# Patient Record
Sex: Male | Born: 1935
Health system: Southern US, Community
[De-identification: ages and names within clinical notes are randomized; demographics above are authoritative.]

## PROBLEM LIST (undated history)

## (undated) DIAGNOSIS — I10 Essential (primary) hypertension: Secondary | ICD-10-CM

## (undated) DIAGNOSIS — I671 Cerebral aneurysm, nonruptured: Secondary | ICD-10-CM

## (undated) DIAGNOSIS — E119 Type 2 diabetes mellitus without complications: Secondary | ICD-10-CM

## (undated) DIAGNOSIS — E785 Hyperlipidemia, unspecified: Secondary | ICD-10-CM

## (undated) DIAGNOSIS — K635 Polyp of colon: Secondary | ICD-10-CM

## (undated) DIAGNOSIS — F0390 Unspecified dementia without behavioral disturbance: Secondary | ICD-10-CM

## (undated) DIAGNOSIS — I213 ST elevation (STEMI) myocardial infarction of unspecified site: Secondary | ICD-10-CM

## (undated) DIAGNOSIS — R569 Unspecified convulsions: Secondary | ICD-10-CM

## (undated) DIAGNOSIS — N529 Male erectile dysfunction, unspecified: Secondary | ICD-10-CM

## (undated) DIAGNOSIS — K219 Gastro-esophageal reflux disease without esophagitis: Secondary | ICD-10-CM

## (undated) DIAGNOSIS — E039 Hypothyroidism, unspecified: Secondary | ICD-10-CM

## (undated) HISTORY — DX: Essential (primary) hypertension: I10

## (undated) HISTORY — DX: Gastro-esophageal reflux disease without esophagitis: K21.9

## (undated) HISTORY — DX: Polyp of colon: K63.5

## (undated) HISTORY — DX: Hyperlipidemia, unspecified: E78.5

## (undated) HISTORY — DX: Male erectile dysfunction, unspecified: N52.9

## (undated) HISTORY — PX: COLONOSCOPY: SHX174

## (undated) HISTORY — PX: OTHER SURGICAL HISTORY: SHX169

## (undated) HISTORY — DX: Hypothyroidism, unspecified: E03.9

## (undated) HISTORY — DX: Unspecified dementia, unspecified severity, without behavioral disturbance, psychotic disturbance, mood disturbance, and anxiety: F03.90

---

## 1999-04-07 ENCOUNTER — Ambulatory Visit (HOSPITAL_BASED_OUTPATIENT_CLINIC_OR_DEPARTMENT_OTHER): Admission: RE | Admit: 1999-04-07 | Discharge: 1999-04-07 | Payer: Self-pay | Admitting: *Deleted

## 2001-06-18 DIAGNOSIS — K635 Polyp of colon: Secondary | ICD-10-CM

## 2001-06-18 HISTORY — DX: Polyp of colon: K63.5

## 2001-11-26 ENCOUNTER — Ambulatory Visit (HOSPITAL_COMMUNITY): Admission: RE | Admit: 2001-11-26 | Discharge: 2001-11-26 | Payer: Self-pay | Admitting: Internal Medicine

## 2001-11-26 ENCOUNTER — Encounter (INDEPENDENT_AMBULATORY_CARE_PROVIDER_SITE_OTHER): Payer: Self-pay | Admitting: Specialist

## 2002-09-08 ENCOUNTER — Encounter: Payer: Self-pay | Admitting: Family Medicine

## 2002-09-08 ENCOUNTER — Encounter: Admission: RE | Admit: 2002-09-08 | Discharge: 2002-09-08 | Payer: Self-pay | Admitting: Family Medicine

## 2004-10-30 ENCOUNTER — Ambulatory Visit: Payer: Self-pay | Admitting: Family Medicine

## 2004-12-08 ENCOUNTER — Ambulatory Visit: Payer: Self-pay | Admitting: Internal Medicine

## 2005-01-05 ENCOUNTER — Encounter (INDEPENDENT_AMBULATORY_CARE_PROVIDER_SITE_OTHER): Payer: Self-pay | Admitting: *Deleted

## 2005-01-05 ENCOUNTER — Ambulatory Visit: Payer: Self-pay | Admitting: Internal Medicine

## 2005-05-08 ENCOUNTER — Ambulatory Visit: Payer: Self-pay | Admitting: Family Medicine

## 2005-10-31 ENCOUNTER — Ambulatory Visit: Payer: Self-pay | Admitting: Family Medicine

## 2006-03-20 ENCOUNTER — Ambulatory Visit: Payer: Self-pay | Admitting: Family Medicine

## 2006-06-18 LAB — FECAL OCCULT BLOOD, GUAIAC: Fecal Occult Blood: NEGATIVE

## 2006-11-12 ENCOUNTER — Ambulatory Visit: Payer: Self-pay | Admitting: Family Medicine

## 2006-11-12 LAB — CONVERTED CEMR LAB
ALT: 26 units/L (ref 0–40)
AST: 26 units/L (ref 0–37)
Albumin: 3.9 g/dL (ref 3.5–5.2)
Alkaline Phosphatase: 48 units/L (ref 39–117)
BUN: 13 mg/dL (ref 6–23)
Basophils Absolute: 0 10*3/uL (ref 0.0–0.1)
Basophils Relative: 0.4 % (ref 0.0–1.0)
Bilirubin, Direct: 0.2 mg/dL (ref 0.0–0.3)
CO2: 30 meq/L (ref 19–32)
Calcium: 9.3 mg/dL (ref 8.4–10.5)
Chloride: 104 meq/L (ref 96–112)
Cholesterol: 155 mg/dL (ref 0–200)
Creatinine, Ser: 0.9 mg/dL (ref 0.4–1.5)
Eosinophils Absolute: 0.1 10*3/uL (ref 0.0–0.6)
Eosinophils Relative: 1.4 % (ref 0.0–5.0)
GFR calc Af Amer: 107 mL/min
GFR calc non Af Amer: 88 mL/min
Glucose, Bld: 124 mg/dL — ABNORMAL HIGH (ref 70–99)
HCT: 47.5 % (ref 39.0–52.0)
HDL: 28.7 mg/dL — ABNORMAL LOW (ref >39.0)
Hemoglobin: 16.5 g/dL (ref 13.0–17.0)
Hgb A1c MFr Bld: 6.5 % — ABNORMAL HIGH (ref 4.6–6.0)
LDL Cholesterol: 94 mg/dL (ref 0–99)
Lymphocytes Relative: 30.1 % (ref 12.0–46.0)
MCHC: 34.8 g/dL (ref 30.0–36.0)
MCV: 86.8 fL (ref 78.0–100.0)
Monocytes Absolute: 0.8 10*3/uL — ABNORMAL HIGH (ref 0.2–0.7)
Monocytes Relative: 9.9 % (ref 3.0–11.0)
Neutro Abs: 4.6 10*3/uL (ref 1.4–7.7)
Neutrophils Relative %: 58.2 % (ref 43.0–77.0)
PSA: 0.39 ng/mL
PSA: 0.39 ng/mL (ref 0.10–4.00)
Platelets: 240 10*3/uL (ref 150–400)
Potassium: 3.6 meq/L (ref 3.5–5.1)
RBC: 5.46 M/uL (ref 4.22–5.81)
RDW: 12.2 % (ref 11.5–14.6)
Sodium: 141 meq/L (ref 135–145)
TSH: 4.96 microintl units/mL (ref 0.35–5.50)
Total Bilirubin: 1.1 mg/dL (ref 0.3–1.2)
Total CHOL/HDL Ratio: 5.4
Total Protein: 7.2 g/dL (ref 6.0–8.3)
Triglycerides: 162 mg/dL — ABNORMAL HIGH (ref 0–149)
VLDL: 32 mg/dL (ref 0–40)
WBC: 7.9 10*3/uL (ref 4.5–10.5)

## 2006-11-15 ENCOUNTER — Encounter: Payer: Self-pay | Admitting: Family Medicine

## 2006-11-15 DIAGNOSIS — K219 Gastro-esophageal reflux disease without esophagitis: Secondary | ICD-10-CM | POA: Insufficient documentation

## 2006-11-15 DIAGNOSIS — D126 Benign neoplasm of colon, unspecified: Secondary | ICD-10-CM | POA: Insufficient documentation

## 2006-11-28 ENCOUNTER — Ambulatory Visit: Payer: Self-pay | Admitting: Family Medicine

## 2007-04-23 ENCOUNTER — Ambulatory Visit: Payer: Self-pay | Admitting: Family Medicine

## 2008-01-27 ENCOUNTER — Ambulatory Visit: Payer: Self-pay | Admitting: Family Medicine

## 2008-01-27 DIAGNOSIS — E785 Hyperlipidemia, unspecified: Secondary | ICD-10-CM | POA: Insufficient documentation

## 2008-01-27 DIAGNOSIS — E783 Hyperchylomicronemia: Secondary | ICD-10-CM | POA: Insufficient documentation

## 2008-01-27 DIAGNOSIS — E039 Hypothyroidism, unspecified: Secondary | ICD-10-CM | POA: Insufficient documentation

## 2008-03-23 ENCOUNTER — Ambulatory Visit: Payer: Self-pay | Admitting: Family Medicine

## 2008-04-15 ENCOUNTER — Encounter: Payer: Self-pay | Admitting: Family Medicine

## 2008-04-15 ENCOUNTER — Ambulatory Visit: Payer: Self-pay | Admitting: Family Medicine

## 2008-04-19 ENCOUNTER — Telehealth: Payer: Self-pay | Admitting: Family Medicine

## 2008-04-19 DIAGNOSIS — C44300 Unspecified malignant neoplasm of skin of unspecified part of face: Secondary | ICD-10-CM | POA: Insufficient documentation

## 2009-02-08 ENCOUNTER — Ambulatory Visit: Payer: Self-pay | Admitting: Family Medicine

## 2009-02-11 ENCOUNTER — Telehealth: Payer: Self-pay | Admitting: *Deleted

## 2009-02-16 ENCOUNTER — Encounter: Payer: Self-pay | Admitting: Family Medicine

## 2009-02-16 ENCOUNTER — Ambulatory Visit: Payer: Self-pay | Admitting: Family Medicine

## 2009-02-17 DIAGNOSIS — L570 Actinic keratosis: Secondary | ICD-10-CM | POA: Insufficient documentation

## 2009-04-14 ENCOUNTER — Ambulatory Visit: Payer: Self-pay | Admitting: Family Medicine

## 2009-04-14 LAB — CONVERTED CEMR LAB
Glucose, Bld: 124 mg/dL — ABNORMAL HIGH (ref 70–99)
Hgb A1c MFr Bld: 6.6 % — ABNORMAL HIGH (ref 4.6–6.5)

## 2009-05-04 ENCOUNTER — Telehealth: Payer: Self-pay | Admitting: Family Medicine

## 2009-11-30 ENCOUNTER — Encounter (INDEPENDENT_AMBULATORY_CARE_PROVIDER_SITE_OTHER): Payer: Self-pay | Admitting: *Deleted

## 2010-02-16 ENCOUNTER — Encounter (INDEPENDENT_AMBULATORY_CARE_PROVIDER_SITE_OTHER): Payer: Self-pay | Admitting: *Deleted

## 2010-02-17 ENCOUNTER — Encounter (INDEPENDENT_AMBULATORY_CARE_PROVIDER_SITE_OTHER): Payer: Self-pay | Admitting: *Deleted

## 2010-02-21 ENCOUNTER — Ambulatory Visit: Payer: Self-pay | Admitting: Internal Medicine

## 2010-02-22 ENCOUNTER — Ambulatory Visit: Payer: Self-pay | Admitting: Family Medicine

## 2010-02-22 LAB — CONVERTED CEMR LAB
ALT: 22 units/L (ref 0–53)
AST: 23 units/L (ref 0–37)
Albumin: 3.9 g/dL (ref 3.5–5.2)
Alkaline Phosphatase: 56 units/L (ref 39–117)
BUN: 18 mg/dL (ref 6–23)
Basophils Absolute: 0 10*3/uL (ref 0.0–0.1)
Basophils Relative: 0.5 % (ref 0.0–3.0)
Bilirubin Urine: NEGATIVE
Bilirubin, Direct: 0.2 mg/dL (ref 0.0–0.3)
CO2: 30 meq/L (ref 19–32)
Calcium: 9 mg/dL (ref 8.4–10.5)
Chloride: 102 meq/L (ref 96–112)
Cholesterol: 161 mg/dL (ref 0–200)
Creatinine, Ser: 0.9 mg/dL (ref 0.4–1.5)
Eosinophils Absolute: 0.1 10*3/uL (ref 0.0–0.7)
Eosinophils Relative: 1.5 % (ref 0.0–5.0)
GFR calc non Af Amer: 86.48 mL/min (ref >60)
Glucose, Bld: 135 mg/dL — ABNORMAL HIGH (ref 70–99)
Glucose, Urine, Semiquant: NEGATIVE
HCT: 48.1 % (ref 39.0–52.0)
HDL: 29.9 mg/dL — ABNORMAL LOW (ref >39.00)
Hemoglobin: 16.5 g/dL (ref 13.0–17.0)
Ketones, urine, test strip: NEGATIVE
LDL Cholesterol: 101 mg/dL — ABNORMAL HIGH (ref 0–99)
Lymphocytes Relative: 33.7 % (ref 12.0–46.0)
Lymphs Abs: 2.5 10*3/uL (ref 0.7–4.0)
MCHC: 34.3 g/dL (ref 30.0–36.0)
MCV: 90.2 fL (ref 78.0–100.0)
Monocytes Absolute: 0.6 10*3/uL (ref 0.1–1.0)
Monocytes Relative: 7.6 % (ref 3.0–12.0)
Neutro Abs: 4.2 10*3/uL (ref 1.4–7.7)
Neutrophils Relative %: 56.7 % (ref 43.0–77.0)
Nitrite: NEGATIVE
PSA: 0.4 ng/mL (ref 0.10–4.00)
Platelets: 198 10*3/uL (ref 150.0–400.0)
Potassium: 4.4 meq/L (ref 3.5–5.1)
Protein, U semiquant: NEGATIVE
RBC: 5.33 M/uL (ref 4.22–5.81)
RDW: 13 % (ref 11.5–14.6)
Sodium: 140 meq/L (ref 135–145)
Specific Gravity, Urine: 1.025
TSH: 6.22 microintl units/mL — ABNORMAL HIGH (ref 0.35–5.50)
Total Bilirubin: 1.2 mg/dL (ref 0.3–1.2)
Total CHOL/HDL Ratio: 5
Total Protein: 6.6 g/dL (ref 6.0–8.3)
Triglycerides: 149 mg/dL (ref 0.0–149.0)
Urobilinogen, UA: 0.2
VLDL: 29.8 mg/dL (ref 0.0–40.0)
WBC Urine, dipstick: NEGATIVE
WBC: 7.5 10*3/uL (ref 4.5–10.5)
pH: 6

## 2010-03-03 ENCOUNTER — Ambulatory Visit: Payer: Self-pay | Admitting: Family Medicine

## 2010-03-03 ENCOUNTER — Telehealth: Payer: Self-pay | Admitting: Family Medicine

## 2010-03-06 ENCOUNTER — Encounter: Payer: Self-pay | Admitting: Family Medicine

## 2010-03-07 ENCOUNTER — Ambulatory Visit: Payer: Self-pay | Admitting: Internal Medicine

## 2010-03-07 LAB — HM COLONOSCOPY

## 2010-03-08 ENCOUNTER — Encounter: Payer: Self-pay | Admitting: Internal Medicine

## 2010-04-10 ENCOUNTER — Ambulatory Visit: Payer: Self-pay | Admitting: Family Medicine

## 2010-04-14 ENCOUNTER — Ambulatory Visit: Payer: Self-pay | Admitting: Family Medicine

## 2010-04-17 LAB — CONVERTED CEMR LAB: TSH: 4.03 microintl units/mL (ref 0.35–5.50)

## 2010-07-16 LAB — CONVERTED CEMR LAB
ALT: 22 units/L (ref 0–53)
ALT: 30 units/L (ref 0–53)
AST: 28 units/L (ref 0–37)
AST: 63 units/L — ABNORMAL HIGH (ref 0–37)
Albumin: 3.7 g/dL (ref 3.5–5.2)
Albumin: 3.9 g/dL (ref 3.5–5.2)
Alkaline Phosphatase: 63 units/L (ref 39–117)
Alkaline Phosphatase: 68 units/L (ref 39–117)
BUN: 11 mg/dL (ref 6–23)
BUN: 15 mg/dL (ref 6–23)
Basophils Absolute: 0 10*3/uL (ref 0.0–0.1)
Basophils Absolute: 0 10*3/uL (ref 0.0–0.1)
Basophils Relative: 0.2 % (ref 0.0–3.0)
Basophils Relative: 0.6 % (ref 0.0–3.0)
Bilirubin, Direct: 0.2 mg/dL (ref 0.0–0.3)
Bilirubin, Direct: 0.2 mg/dL (ref 0.0–0.3)
Blood in Urine, dipstick: NEGATIVE
Blood in Urine, dipstick: NEGATIVE
CO2: 30 meq/L (ref 19–32)
CO2: 32 meq/L (ref 19–32)
Calcium: 8.9 mg/dL (ref 8.4–10.5)
Calcium: 9.4 mg/dL (ref 8.4–10.5)
Chloride: 100 meq/L (ref 96–112)
Chloride: 104 meq/L (ref 96–112)
Cholesterol: 144 mg/dL (ref 0–200)
Cholesterol: 161 mg/dL (ref 0–200)
Creatinine, Ser: 0.9 mg/dL (ref 0.4–1.5)
Creatinine, Ser: 0.9 mg/dL (ref 0.4–1.5)
Eosinophils Absolute: 0.1 10*3/uL (ref 0.0–0.7)
Eosinophils Absolute: 0.1 10*3/uL (ref 0.0–0.7)
Eosinophils Relative: 1 % (ref 0.0–5.0)
Eosinophils Relative: 1.2 % (ref 0.0–5.0)
GFR calc Af Amer: 107 mL/min
GFR calc non Af Amer: 87.84 mL/min (ref >60)
GFR calc non Af Amer: 88 mL/min
Glucose, Bld: 145 mg/dL — ABNORMAL HIGH (ref 70–99)
Glucose, Bld: 152 mg/dL — ABNORMAL HIGH (ref 70–99)
Glucose, Urine, Semiquant: NEGATIVE
Glucose, Urine, Semiquant: NEGATIVE
HCT: 45.9 % (ref 39.0–52.0)
HCT: 47.3 % (ref 39.0–52.0)
HDL: 29.2 mg/dL — ABNORMAL LOW (ref >39.0)
HDL: 32.2 mg/dL — ABNORMAL LOW (ref >39.00)
Hemoglobin: 15.8 g/dL (ref 13.0–17.0)
Hemoglobin: 16.3 g/dL (ref 13.0–17.0)
Ketones, urine, test strip: NEGATIVE
LDL Cholesterol: 81 mg/dL (ref 0–99)
LDL Cholesterol: 92 mg/dL (ref 0–99)
Lymphocytes Relative: 24.9 % (ref 12.0–46.0)
Lymphocytes Relative: 27.2 % (ref 12.0–46.0)
Lymphs Abs: 2 10*3/uL (ref 0.7–4.0)
MCHC: 34.4 g/dL (ref 30.0–36.0)
MCHC: 34.5 g/dL (ref 30.0–36.0)
MCV: 88.9 fL (ref 78.0–100.0)
MCV: 91.8 fL (ref 78.0–100.0)
Monocytes Absolute: 0.6 10*3/uL (ref 0.1–1.0)
Monocytes Absolute: 0.6 10*3/uL (ref 0.1–1.0)
Monocytes Relative: 7.4 % (ref 3.0–12.0)
Monocytes Relative: 7.8 % (ref 3.0–12.0)
Neutro Abs: 4.8 10*3/uL (ref 1.4–7.7)
Neutro Abs: 5.1 10*3/uL (ref 1.4–7.7)
Neutrophils Relative %: 63.6 % (ref 43.0–77.0)
Neutrophils Relative %: 66.1 % (ref 43.0–77.0)
Nitrite: NEGATIVE
Nitrite: NEGATIVE
PSA: 0.33 ng/mL (ref 0.10–4.00)
PSA: 0.37 ng/mL (ref 0.10–4.00)
Platelets: 181 10*3/uL (ref 150.0–400.0)
Platelets: 216 10*3/uL (ref 150–400)
Potassium: 4.1 meq/L (ref 3.5–5.1)
Potassium: 4.5 meq/L (ref 3.5–5.1)
RBC: 5 M/uL (ref 4.22–5.81)
RBC: 5.32 M/uL (ref 4.22–5.81)
RDW: 12.1 % (ref 11.5–14.6)
RDW: 12.2 % (ref 11.5–14.6)
Sodium: 137 meq/L (ref 135–145)
Sodium: 140 meq/L (ref 135–145)
Specific Gravity, Urine: 1.02
Specific Gravity, Urine: 1.02
TSH: 4.68 microintl units/mL (ref 0.35–5.50)
TSH: 4.82 microintl units/mL (ref 0.35–5.50)
Total Bilirubin: 1.3 mg/dL — ABNORMAL HIGH (ref 0.3–1.2)
Total Bilirubin: 1.4 mg/dL — ABNORMAL HIGH (ref 0.3–1.2)
Total CHOL/HDL Ratio: 4
Total CHOL/HDL Ratio: 5.5
Total Protein: 7.2 g/dL (ref 6.0–8.3)
Total Protein: 7.3 g/dL (ref 6.0–8.3)
Triglycerides: 156 mg/dL — ABNORMAL HIGH (ref 0.0–149.0)
Triglycerides: 198 mg/dL — ABNORMAL HIGH (ref 0–149)
Urobilinogen, UA: 1
Urobilinogen, UA: 4
VLDL: 31.2 mg/dL (ref 0.0–40.0)
VLDL: 40 mg/dL (ref 0–40)
WBC Urine, dipstick: NEGATIVE
WBC Urine, dipstick: NEGATIVE
WBC: 7.5 10*3/uL (ref 4.5–10.5)
WBC: 7.7 10*3/uL (ref 4.5–10.5)
pH: 6
pH: 6

## 2010-07-20 NOTE — Assessment & Plan Note (Signed)
Summary: CPX/RCD   Vital Signs:  Patient profile:   77 year old male Height:      71.75 inches Weight:      209 pounds BMI:     28.65 O2 Sat:      97 % on Room air Temp:     97.8 degrees F oral BP sitting:   112 / 80  (right arm) Cuff size:   regular  Vitals Entered By: Kathrynn Speed CMA (March 03, 2010 9:41 AM)  O2 Flow:  Room air CC: cpx, with lab review, src Is Patient Diabetic? No Flu Vaccine Consent Questions     Do you have a history of severe allergic reactions to this vaccine? no    Any prior history of allergic reactions to egg and/or gelatin? no    Do you have a sensitivity to the preservative Thimersol? no    Do you have a past history of Guillan-Barre Syndrome? no    Do you currently have an acute febrile illness? no    Have you ever had a severe reaction to latex? no    Vaccine information given and explained to patient? yes    Are you currently pregnant? no    Lot Number:[id number]   Exp Date:12/16/2010   Site Given  Left Deltoid IM   CC:  cpx, with lab review, and src.  History of Present Illness: Gerald Leach is a 77 year old, married male, nonsmoker, who comes in today for evaluation of multiple issues.  He has a history of hypertension, for which he takes Tenoretic, dose one half tab daily BP 112/80.  He takes Zocor 10 mg nightly for hyperlipidemia.  Lipids are ago with LDL of 101.  He also takes Synthroid 50 micrograms daily for hypothyroidism his Synthroid will be increased because his TSH level is over 5.5.  It 6.22.  He also takes Prilosec 20 mg 3 times a week for reflux esophagitis.  It is not needed.  Daily.  He takes an 81-mg baby aspirin gets routine eye care.  Dental care.  Colonoscopy in the next week and GI, tetanus, 2005, Pneumovax 2010, seasonal flu shot today, information given on shingles  He also has a history of skin cancer.  He saw the dermatologist yesterday.  Preventive Screening-Counseling & Management  Alcohol-Tobacco     Smoking  Status: never  Current Medications (verified): 1)  Levothyroxine Sodium 50 Mcg Tabs (Levothyroxine Sodium) .Marland Kitchen.. 1 Once Daily 2)  Prilosec 20 Mg Cpdr (Omeprazole) .... Take 1 Capsule By Mouth Three Times A Week. 3)  Tenoretic 50 50-25 Mg Tabs (Atenolol-Chlorthalidone) .... 1/2 Tablet By Mouth Once A Day 4)  Zocor 10 Mg Tabs (Simvastatin) .... Take 1 Tablet By Mouth At Bedtime 5)  Aspirin Adult Low Strength 81 Mg  Tbec (Aspirin) .... Once Daily  Allergies (verified): No Known Drug Allergies  Past History:  Past medical, surgical, family and social histories (including risk factors) reviewed, and no changes noted (except as noted below).  Past Medical History: Reviewed history from 01/27/2008 and no changes required. Hypertension COLONIC POLYPS-03 GERD ED  brain aneurysm surgery at [facility], 2002 Hyperlipidemia Hypothyroidism  Past Surgical History: Reviewed history from 11/15/2006 and no changes required. CAROTID ARTERY ANEURYSM COLONOSCOPY -POLYPS R HERNIA  Family History: Reviewed history and no changes required.  Social History: Reviewed history from 01/27/2008 and no changes required. Retired Married Never Smoked Alcohol use-no Drug use-no Regular exercise-yes  Review of Systems      See HPI  Physical Exam  General:  Well-developed,well-nourished,in no acute distress; alert,appropriate and cooperative throughout examination Head:  Normocephalic and atraumatic without obvious abnormalities. No apparent alopecia or balding. Eyes:  No corneal or conjunctival inflammation noted. EOMI. Perrla. Funduscopic exam benign, without hemorrhages, exudates or papilledema. Vision grossly normal. Ears:  External ear exam shows no significant lesions or deformities.  Otoscopic examination reveals clear canals, tympanic membranes are intact bilaterally without bulging, retraction, inflammation or discharge. Hearing is grossly normal bilaterally. Nose:  External nasal examination  shows no deformity or inflammation. Nasal mucosa are pink and moist without lesions or exudates. Mouth:  Oral mucosa and oropharynx without lesions or exudates.  Teeth in good repair. Neck:  No deformities, masses, or tenderness noted. Chest Wall:  No deformities, masses, tenderness or gynecomastia noted. Breasts:  No masses or gynecomastia noted Lungs:  Normal respiratory effort, chest expands symmetrically. Lungs are clear to auscultation, no crackles or wheezes. Heart:  Normal rate and regular rhythm. S1 and S2 normal without gallop, murmur, click, rub or other extra sounds. Abdomen:  Bowel sounds positive,abdomen soft and non-tender without masses, organomegaly or hernias noted. Rectal:  No external abnormalities noted. Normal sphincter tone. No rectal masses or tenderness. Genitalia:  Testes bilaterally descended without nodularity, tenderness or masses. No scrotal masses or lesions. No penis lesions or urethral discharge. Prostate:  Prostate gland firm and smooth, no enlargement, nodularity, tenderness, mass, asymmetry or induration. Msk:  No deformity or scoliosis noted of thoracic or lumbar spine.   Pulses:  R and L carotid,radial,femoral,dorsalis pedis and posterior tibial pulses are full and equal bilaterally Extremities:  No clubbing, cyanosis, edema, or deformity noted with normal full range of motion of all joints.   Neurologic:  No cranial nerve deficits noted. Station and gait are normal. Plantar reflexes are down-going bilaterally. DTRs are symmetrical throughout. Sensory, motor and coordinative functions appear intact. Skin:  Intact without suspicious lesions or rashes Cervical Nodes:  No lymphadenopathy noted Axillary Nodes:  No palpable lymphadenopathy Inguinal Nodes:  No significant adenopathy Psych:  Cognition and judgment appear intact. Alert and cooperative with normal attention span and concentration. No apparent delusions, illusions, hallucinations   Impression &  Recommendations:  Problem # 1:  HYPOTHYROIDISM (ICD-244.9) Assessment Deteriorated  The following medications were removed from the medication list:    Levothyroxine Sodium 50 Mcg Tabs (Levothyroxine sodium) .Marland Kitchen... 1 once daily His updated medication list for this problem includes:    Synthroid 75 Mcg Tabs (Levothyroxine sodium) .Marland Kitchen... Take 1 tablet by mouth every morning  Orders: Prescription Created Electronically [phone])  Problem # 2:  GERD (ICD-530.81) Assessment: Improved  His updated medication list for this problem includes:    Prilosec 20 Mg Cpdr (Omeprazole) .Marland Kitchen... Take 1 capsule by mouth three times a week.  Orders: Prescription Created Electronically [phone])  Problem # 3:  HYPERTENSION (ICD-401.9) Assessment: Improved  His updated medication list for this problem includes:    Tenoretic 50 50-25 Mg Tabs (Atenolol-chlorthalidone) .Marland Kitchen... 1/2 tablet by mouth once a day  Orders: EKG w/ Interpretation (93000) Prescription Created Electronically [phone])  Problem # 4:  Preventive Health Care (ICD-V70.0) Assessment: Unchanged  Complete Medication List: 1)  Prilosec 20 Mg Cpdr (Omeprazole) .... Take 1 capsule by mouth three times a week. 2)  Tenoretic 50 50-25 Mg Tabs (Atenolol-chlorthalidone) .... 1/2 tablet by mouth once a day 3)  Zocor 10 Mg Tabs (Simvastatin) .... Take 1 tablet by mouth at bedtime 4)  Aspirin Adult Low Strength 81 Mg Tbec (Aspirin) .... Once daily 5)  Synthroid 75  Mcg Tabs (Levothyroxine sodium) .... Take 1 tablet by mouth every morning  Other Orders: Admin 1st Vaccine (16010) Flu Vaccine 30yrs + ([address]3235)  Patient Instructions: 1)  increased the thyroid medication to 75 micrograms daily daily follow-up TSH level in 6 weeks.  Code number 244.9. 2)  Please schedule a follow-up appointment in 1 year. Prescriptions: ZOCOR 10 MG TABS (SIMVASTATIN) Take 1 tablet by mouth at bedtime  #100 x 3   Entered and Authorized by:   Roderick Pee MD   Signed by:    Roderick Pee MD on 03/03/2010   Method used:   [facility] to        [facility] [address] [phone]* (retail)       [address]       [place], [place]  [address]       Ph: [phone] or [phone]       Fax: [phone]   RxID:   [id number] TENORETIC 50 50-25 MG TABS (ATENOLOL-CHLORTHALIDONE) 1/2 tablet by mouth once a day  #50 x 3   Entered and Authorized by:   Roderick Pee MD   Signed by:   Roderick Pee MD on 03/03/2010   Method used:   [facility]        [facility] [address] [phone]* (retail)       [address]       [place], [place]  [address]       Ph: [phone] or [phone]       Fax: [phone]   RxID:   [id number] PRILOSEC 20 MG CPDR (OMEPRAZOLE) Take 1 capsule by mouth three times a week.  #90 x 2   Entered and Authorized by:   Roderick Pee MD   Signed by:   Roderick Pee MD on 03/03/2010   Method used:   [facility]        [facility] [address] [phone]* (retail)       [address]       [place], [place]  [address]       Ph: [phone] or [phone]       Fax: [phone]   RxID:   [id number] SYNTHROID 75 MCG TABS (LEVOTHYROXINE SODIUM) Take 1 tablet by mouth every morning  #100 x 3   Entered and Authorized by:   Roderick Pee MD   Signed by:   Roderick Pee MD on 03/03/2010   Method used:   [facility]        [facility] [address] [phone]* (retail)       [address]       [place], [place]  [address]       Ph: [phone] or [phone]       Fax: [phone]   RxID:   [id number]

## 2010-07-20 NOTE — Miscellaneous (Signed)
Summary: [facility] Previsit/prep  Clinical Lists Changes  Medications: Added new medication of DULCOLAX 5 MG  TBEC (BISACODYL) Day before procedure take 2 at 3pm and 2 at 8pm. - Signed Added new medication of METOCLOPRAMIDE HCL 10 MG  TABS (METOCLOPRAMIDE HCL) As per prep instructions. - Signed Added new medication of MIRALAX   POWD (POLYETHYLENE GLYCOL 3350) As per prep  instructions. - Signed Rx of DULCOLAX 5 MG  TBEC (BISACODYL) Day before procedure take 2 at 3pm and 2 at 8pm.;  #4 x 0;  Signed;  Entered by: Wyona Almas RN;  Authorized by: Hart Carwin MD;  Method used: [facility]  [address] [phone]*, [address], [place], [place]  [address], Ph: [phone] or [phone], Fax: [phone] Rx of METOCLOPRAMIDE HCL 10 MG  TABS (METOCLOPRAMIDE HCL) As per prep instructions.;  #2 x 0;  Signed;  Entered by: Wyona Almas RN;  Authorized by: Hart Carwin MD;  Method used: [facility]  [address] [phone]*, [address], [place], [place]  [address], Ph: [phone] or [phone], Fax: [phone] Rx of MIRALAX   POWD (POLYETHYLENE GLYCOL 3350) As per prep  instructions.;  #255gm x 0;  Signed;  Entered by: Wyona Almas RN;  Authorized by: Hart Carwin MD;  Method used: [facility]  [address] [phone]*, [address], [place], [place]  [address], Ph: [phone] or [phone], Fax: [phone] Observations: Added new observation of NKA: T (02/21/2010 16:08)    Prescriptions: MIRALAX   POWD (POLYETHYLENE GLYCOL 3350) As per prep  instructions.  #255gm x 0   Entered by:   Wyona Almas RN   Authorized by:   Hart Carwin MD   Signed by:   Wyona Almas RN on 02/21/2010   Method used:   [facility]ron[facility]        [facility] [address] [phone]* (retail)       [address]       [place], [place]  [address]       Ph: [phone] or [phone]       Fax: [phone]   RxID:   [id number] METOCLOPRAMIDE HCL 10 MG  TABS  (METOCLOPRAMIDE HCL) As per prep instructions.  #2 x 0   Entered by:   Wyona Almas RN   Authorized by:   Hart Carwin MD   Signed by:   Wyona Almas RN on 02/21/2010   Method used:   [facility]ronically [facility]        [facility] [address] [phone]* (retail)       [address]       [place], [place]  [address]       Ph: [phone] or [phone]       Fax: [phone]   RxID:   [id number] DULCOLAX 5 MG  TBEC (BISACODYL) Day before procedure take 2 at 3pm and 2 at 8pm.  #4 x 0   Entered by:   Wyona Almas RN   Authorized by:   Hart Carwin MD   Signed by:   Wyona Almas RN on 02/21/2010   Method used:   [facility]        [facility] [address] [phone]* (retail)       [address]       [place], [place]  [address]       Ph: [phone] or [phone]       Fax: [phone]   RxID:  [id number]  

## 2010-07-20 NOTE — Letter (Signed)
Summary: Patient Notice- Polyp Results  [facility]  [address] [place], [place] [address]   Phone: [phone]  Fax: [phone]        March 08, 2010 MRN: [id number]    TEL HEVIA [address] [place], [place]  [address]    Dear Mr. UMHOLTZ,  I am pleased to inform you that the colon polyp(s) removed during your recent colonoscopy was (were) found to be benign (no cancer detected) upon pathologic examination.The polyp is adenomatous ( precancerous)  I recommend you have a repeat colonoscopy examination in _5 years to look for recurrent polyps, as having colon polyps increases your risk for having recurrent polyps or even colon cancer in the future.  Should you develop new or worsening symptoms of abdominal pain, bowel habit changes or bleeding from the rectum or bowels, please schedule an evaluation with either your primary care physician or with me.  Additional information/recommendations:  _x_ No further action with gastroenterology is needed at this time. Please      follow-up with your primary care physician for your other healthcare      needs.  __ Please call [phone] to schedule a return visit to review your      situation.  __ Please keep your follow-up visit as already scheduled.  __ Continue treatment plan as outlined the day of your exam.  Please call us if you are having persistent problems or have questions about your condition that have not been fully answered at this time.  Sincerely,  Hart Carwin MD  This letter has been electronically signed by your physician.  Appended Document: Patient Notice- Polyp Results letter mailed

## 2010-07-20 NOTE — Letter (Signed)
Summary: [facility] Instructions  [facility]  [address] [place], [place] [address]   Phone: [phone]  Fax: [phone]       SURAJ RAMDASS    [birth year]    MRN: [id number]       Procedure Day /Date: Tuesday   03-07-10     Arrival Time:  7:30 a.m.     Procedure Time: 8:30 a.m.     Location of Procedure:                    _x_  [facility] (4th Floor)    PREPARATION FOR COLONOSCOPY WITH MIRALAX  Starting 5 days prior to your procedure 03-02-10 do not eat nuts, seeds, popcorn, corn, beans, peas,  salads, or any raw vegetables.  Do not take any fiber supplements (e.g. Metamucil, Citrucel, and Benefiber). ____________________________________________________________________________________________________   THE DAY BEFORE YOUR PROCEDURE         DATE: 03-06-10  DAY:  Monday  1   Drink clear liquids the entire day-NO SOLID FOOD  2   Do not drink anything colored red or purple.  Avoid juices with pulp.  No orange juice.  3   Drink at least 64 oz. (8 glasses) of fluid/clear liquids during the day to prevent dehydration and help the prep work efficiently.  CLEAR LIQUIDS INCLUDE: Water Jello Ice Popsicles Tea (sugar ok, no milk/cream) Powdered fruit flavored drinks Coffee (sugar ok, no milk/cream) Gatorade Juice: apple, white grape, white cranberry  Lemonade Clear bullion, consomm, broth Carbonated beverages (any kind) Strained chicken noodle soup Hard Candy  4   Mix the entire bottle of Miralax with 64 oz. of Gatorade/Powerade in the morning and put in the refrigerator to chill.  5   At 3:00 pm take 2 Dulcolax/Bisacodyl tablets.  6   At 4:30 pm take one Reglan/Metoclopramide tablet.  7  Starting at 5:00 pm drink one 8 oz glass of the Miralax mixture every 15-20 minutes until you have finished drinking the entire 64 oz.  You should finish drinking prep around 7:30 or 8:00 pm.  8   If you are nauseated, you may take the 2nd  Reglan/Metoclopramide tablet at 6:30 pm.        9    At 8:00 pm take 2 more DULCOLAX/Bisacodyl tablets.     THE DAY OF YOUR PROCEDURE      DATE: 03-07-10    DAY: Tuesday  You may drink clear liquids until  6:30 a.m.  (2 HOURS BEFORE PROCEDURE).   MEDICATION INSTRUCTIONS  Unless otherwise instructed, you should take regular prescription medications with a small sip of water as early as possible the morning of your procedure.   Additional medication instructions: Hold Tenoretic the morning of procedure.         OTHER INSTRUCTIONS  You will need a responsible adult at least 75 years of age to accompany you and drive you home.   This person must remain in the waiting room during your procedure.  Wear loose fitting clothing that is easily removed.  Leave jewelry and other valuables at home.  However, you may wish to bring a book to read or an iPod/MP3 player to listen to music as you wait for your procedure to start.  Remove all body piercing jewelry and leave at home.  Total time from sign-in until discharge is approximately 2-3 hours.  You should go home directly after your procedure and rest.  You can resume normal activities the day  after your procedure.  The day of your procedure you should not:   Drive   Make legal decisions   Operate machinery   Drink alcohol   Return to work  You will receive specific instructions about eating, activities and medications before you leave.   The above instructions have been reviewed and explained to me by   Wyona Almas RN  February 21, 2010 4:31 PM     I fully understand and can verbalize these instructions _____________________________ Date _______

## 2010-07-20 NOTE — Letter (Signed)
Summary: Pre Visit Letter Revised  [facility]  [address] [place], [place] [address]   Phone: [phone]  Fax: [phone]        02/16/2010 MRN: [id number] Gerald Leach [address] [place], [place]  [address]             Procedure Date:  03/07/2010  Welcome to the Gastroenterology Division at [facility].    You are scheduled to see a nurse for your pre-procedure visit on 02/21/2010 at 4:00PM on the 3rd floor at [facility], [address]  We ask that you try to arrive at our office 15 minutes prior to your appointment time to allow for check-in.  Please take a minute to review the attached form.  If you answer "Yes" to one or more of the questions on the first page, we ask that you call the person listed at your earliest opportunity.  If you answer "No" to all of the questions, please complete the rest of the form and bring it to your appointment.    Your nurse visit will consist of discussing your medical and surgical history, your immediate family medical history, and your medications.   If you are unable to list all of your medications on the form, please bring the medication bottles to your appointment and we will list them.  We will need to be aware of both prescribed and over the counter drugs.  We will need to know exact dosage information as well.    Please be prepared to read and sign documents such as consent forms, a financial agreement, and acknowledgement forms.  If necessary, and with your consent, a friend or relative is welcome to sit-in on the nurse visit with you.  Please bring your insurance card so that we may make a copy of it.  If your insurance requires a referral to see a specialist, please bring your referral form from your primary care physician.  No co-pay is required for this nurse visit.     If you cannot keep your appointment, please call [phone] to cancel or reschedule prior to your appointment date.  This  allows [place] the opportunity to schedule an appointment for another patient in need of care.    Thank you for choosing [facility] for your medical needs.  We appreciate the opportunity to care for you.  Please visit [place] at our website  to learn more about our practice.  Sincerely, The Gastroenterology Division

## 2010-07-20 NOTE — Letter (Signed)
Summary: Colonoscopy Letter  [facility]  [address] [place], [place] [address]   Phone: [phone]  Fax: [phone]      November 30, 2009 MRN: [id number]   [name] [address] [place], [place]  [address]   Dear Mr. BROADY,   According to your medical record, it is time for you to schedule a Colonoscopy. The American Cancer Society recommends this procedure as a method to detect early colon cancer. Patients with a family history of colon cancer, or a personal history of colon polyps or inflammatory bowel disease are at increased risk.  This letter has beeen generated based on the recommendations made at the time of your procedure. If you feel that in your particular situation this may no longer apply, please contact our office.  Please call our office at [phone] to schedule this appointment or to update your records at your earliest convenience.  Thank you for cooperating with [place] to provide you with the very best care possible.   Sincerely,  Hedwig Morton. Juanda Chance, M.D.  [facility] Gastroenterology Division [phone]

## 2010-07-20 NOTE — Procedures (Signed)
Summary: Colonoscopy  Patient: [name] Note: All result statuses are Final unless otherwise noted.  Tests: (1) Colonoscopy (COL)   COL Colonoscopy           DONE     [facility]     [address]     [place], [place]  [address]           COLONOSCOPY PROCEDURE REPORT           PATIENT:  Gerald Leach, Gerald Leach  MR#:  [id number]     BIRTHDATE:  01-14-1934, 76 yrs. old  GENDER:  male     ENDOSCOPIST:  Hedwig Morton. Juanda Chance, MD     REF. BY:  Tinnie Gens A. [name], M.D.     PROCEDURE DATE:  03/07/2010     PROCEDURE:  Colonoscopy [address]     ASA CLASS:  Class II     INDICATIONS:  tubular adenoma 2006     MEDICATIONS:   Versed 9 mg, Fentanyl 75 mcg           DESCRIPTION OF PROCEDURE:   After the risks benefits and     alternatives of the procedure were thoroughly explained, informed     consent was obtained.  Digital rectal exam was performed and     revealed no rectal masses.   The LB [id number] endoscope     was introduced through the anus and advanced to the cecum, which     was identified by both the appendix and ileocecal valve, without     limitations.  The quality of the prep was good, using MiraLax.     The instrument was then slowly withdrawn as the colon was fully     examined.     <<PROCEDUREIMAGES>>           FINDINGS:  A diminutive polyp was found. 3 mm sessile polyp at 60     cm The polyp was removed using cold biopsy forceps (see image5).     Moderate diverticulosis was found in the sigmoid colon (see image2     and image1).  This was otherwise a normal examination of the colon     (see image3, image4, and image8).   Retroflexed views in the     rectum revealed no abnormalities.    The scope was then withdrawn     from the patient and the procedure completed.           COMPLICATIONS:  None     ENDOSCOPIC IMPRESSION:     1) Diminutive polyp     2) Moderate diverticulosis in the sigmoid colon     3) Otherwise normal examination     RECOMMENDATIONS:     1) Await  pathology results     REPEAT EXAM:  In 5 - 7 year(s) for.           ______________________________     Hedwig Morton. Juanda Chance, MD           CC:           n.     eSIGNED:   Hedwig Morton. Brodie at 03/07/2010 09:16 AM           Stephanie Acre, [phone]  Note: An exclamation mark (!) indicates a result that was not dispersed into the flowsheet. Document Creation Date: 03/07/2010 9:17 AM _______________________________________________________________________  (1) Order result status: Final Collection or observation date-time: 03/07/2010 09:06 Requested date-time:  Receipt date-time:  Reported date-time:  Referring Physician:   Ordering  Physician: Lina Sar [phone]) Specimen Source:  Source: Launa Grill Order Number: [id number] Lab site:   Appended Document: Colonoscopy     Procedures Next Due Date:    Colonoscopy: 02/2015

## 2010-07-20 NOTE — Assessment & Plan Note (Signed)
Summary: shingles shot/cjr   Nurse Visit   Allergies: No Known Drug Allergies  Immunizations Administered:  Zostavax # 1:    Vaccine Type: Zostavax    Site: left deltoid    Mfr: Merck    Dose: 0.65    Route: Peconic    Given by: Kern Reap CMA (AAMA)    Exp. Date: 02/03/2011    Lot #: [id number]    VIS given: 03/30/05 given April 10, 2010.    Physician counseled: yes  Orders Added: 1)  Zoster (Shingles) Vaccine Live [90736] 2)  Admin 1st Vaccine [phone]

## 2010-07-20 NOTE — Miscellaneous (Signed)
Summary: synthroid 50   Medications Added SYNTHROID 50 MCG TABS (LEVOTHYROXINE SODIUM) take one tab by mouth once daily       Clinical Lists Changes  Medications: Added new medication of SYNTHROID 50 MCG TABS (LEVOTHYROXINE SODIUM) take one tab by mouth once daily - Signed Rx of SYNTHROID 50 MCG TABS (LEVOTHYROXINE SODIUM) take one tab by mouth once daily;  #90 x 1;  Signed;  Entered by: Kern Reap CMA (AAMA);  Authorized by: Roderick Pee MD;  Method used: [facility]  [address] [phone]*, [address], [place], [place]  [address], Ph: [phone] or [phone], Fax: [phone]    Prescriptions: SYNTHROID 50 MCG TABS (LEVOTHYROXINE SODIUM) take one tab by mouth once daily  #90 x 1   Entered by:   Kern Reap CMA (AAMA)   Authorized by:   Roderick Pee MD   Signed by:   Kern Reap CMA (AAMA) on 03/06/2010   Method used:   [facility]ronically to        [facility] [address] [phone]* (retail)       [address]       [place], [place]  [address]       Ph: [phone] or [phone]       Fax: [phone]   RxID:   [id number]

## 2010-07-20 NOTE — Progress Notes (Signed)
Summary: thyroid rx  Phone Note Call from Patient Call back at [phone]   Summary of Call: patient is calling because he forgot that he has not been taking his synthroid every day.  a new rx was called in for a high dose and he is not sure if he should take the higher dose? Initial call taken by: Kern Reap CMA Duncan Dull),  March 03, 2010 3:22 PM  Follow-up for Phone Call        okay  to call in the previous dose...........  However, take the medication daily follow-up TSH level in 6 weeks.Marland Kitchen..244.9 Follow-up by: Roderick Pee MD,  March 03, 2010 4:17 PM  Additional Follow-up for Phone Call Additional follow up Details #1::        Phone Call Completed Additional Follow-up by: Kern Reap CMA Duncan Dull),  March 03, 2010 5:20 PM

## 2010-11-03 NOTE — Op Note (Signed)
[facility]  Patient:    Gerald Leach, Gerald Leach Visit Number: [id number] MRN: [id number]          Service Type: END Location: ENDO Attending Physician:  Mervin Hack Dictated by:   Hedwig Morton. Juanda Chance, M.D. [facility] Admit Date:  11/26/2001 Discharge Date: 11/26/2001   CC:         Evette Georges, M.D. [facility]   Operative Report  PROCEDURE PERFORMED:  Colonoscopy.  INDICATIONS:  This 77 year old white male was found to have a large pedunculated polyp in the sigmoid colon this morning on flexible sigmoidoscopy. Because he has been all ready prepped he is undergoing full colonoscopy now with plans to rule out additional polyps and to remove the existing polyp.  The patient has never had a colonoscopy.  There is no family history of colon cancer.  He has been on aspirin 375 mg a day.  ENDOSCOPE:  Olympus single channel video endoscope.  SEDATION: Versed 7 mg IV, Demerol 100 mg IV.  FINDINGS:  Olympus single channel video endoscope passed under direct vision into rectum to sigmoid colon.  The patient was monitored by pulse oximetry. His oxygen saturations were normal.  His prep was adequate.  Anal canal and rectal ampulla was unremarkable.  There were numerous diverticula through the sigmoid colon which showed large hypertrophied folds and some deep wide mouth diverticula.  At the level of 40 cm from the rectum was a large pedunculated polyp measuring 1.5 cm in diameter and having a long stalk at least 2 cm long. It was freely mobile. It was removed with snare and sent to pathology in one piece.  There was no bleeding from the post polypectomy site.  The descending colon, splenic flexure, transverse colon, and hepatic flexure was unremarkable.  Normal ascending and the cecum.  Cecal pouch was viewed slowly and shows normal mucosa.  No additional polyps were found. Colonoscope was then retracted.  The colon decompressed.  The patient tolerated the  procedure well.  IMPRESSION: 1. Left colon polyp status post polypectomy. 2. Moderately severe diverticular disease of the left colon.  PLAN: 1. Post polypectomy instructions which will include discontinuation of    aspirin for a period of two weeks. 2. Repeat colonoscopy in three years. 3. Education about diverticulosis.  The patient should stay on high fiber    diet with fiber supplements. Dictated by:   Hedwig Morton. Juanda Chance, M.D. [facility] Attending Physician:  Mervin Hack DD:  11/26/01 TD:  11/28/01 Job: [id number]

## 2010-11-10 ENCOUNTER — Other Ambulatory Visit: Payer: Self-pay | Admitting: Family Medicine

## 2011-03-22 ENCOUNTER — Other Ambulatory Visit: Payer: Self-pay

## 2011-03-29 ENCOUNTER — Encounter: Payer: Self-pay | Admitting: Family Medicine

## 2011-04-19 ENCOUNTER — Other Ambulatory Visit (INDEPENDENT_AMBULATORY_CARE_PROVIDER_SITE_OTHER): Payer: Medicare Other

## 2011-04-19 ENCOUNTER — Other Ambulatory Visit: Payer: Self-pay | Admitting: Family Medicine

## 2011-04-19 DIAGNOSIS — I10 Essential (primary) hypertension: Secondary | ICD-10-CM

## 2011-04-19 DIAGNOSIS — E785 Hyperlipidemia, unspecified: Secondary | ICD-10-CM

## 2011-04-19 DIAGNOSIS — Z125 Encounter for screening for malignant neoplasm of prostate: Secondary | ICD-10-CM

## 2011-04-19 DIAGNOSIS — Z79899 Other long term (current) drug therapy: Secondary | ICD-10-CM

## 2011-04-19 DIAGNOSIS — Z0000 Encounter for general adult medical examination without abnormal findings: Secondary | ICD-10-CM

## 2011-04-19 LAB — BASIC METABOLIC PANEL
BUN: 11 mg/dL (ref 6–23)
CO2: 29 mEq/L (ref 19–32)
Calcium: 9 mg/dL (ref 8.4–10.5)
Chloride: 98 mEq/L (ref 96–112)
Creatinine, Ser: 0.7 mg/dL (ref 0.4–1.5)

## 2011-04-19 LAB — LIPID PANEL
HDL: 36.6 mg/dL — ABNORMAL LOW (ref >39.00)
Total CHOL/HDL Ratio: 4
Triglycerides: 198 mg/dL — ABNORMAL HIGH (ref 0.0–149.0)
VLDL: 39.6 mg/dL (ref 0.0–40.0)

## 2011-04-19 LAB — POCT URINALYSIS DIPSTICK
Blood, UA: NEGATIVE
Spec Grav, UA: 1.025

## 2011-04-19 LAB — CBC WITH DIFFERENTIAL/PLATELET
Basophils Absolute: 0 10*3/uL (ref 0.0–0.1)
Eosinophils Absolute: 0 10*3/uL (ref 0.0–0.7)
Lymphocytes Relative: 26.8 % (ref 12.0–46.0)
MCHC: 33.7 g/dL (ref 30.0–36.0)
MCV: 90.3 fl (ref 78.0–100.0)
Monocytes Absolute: 0.6 10*3/uL (ref 0.1–1.0)
Neutrophils Relative %: 64.9 % (ref 43.0–77.0)
Platelets: 206 10*3/uL (ref 150.0–400.0)
RDW: 12.9 % (ref 11.5–14.6)

## 2011-04-19 LAB — MICROALBUMIN / CREATININE URINE RATIO
Creatinine,U: 186.8 mg/dL
Microalb Creat Ratio: 1 mg/g (ref 0.0–30.0)

## 2011-04-19 LAB — HEPATIC FUNCTION PANEL
Bilirubin, Direct: 0.2 mg/dL (ref 0.0–0.3)
Total Bilirubin: 1 mg/dL (ref 0.3–1.2)
Total Protein: 6.8 g/dL (ref 6.0–8.3)

## 2011-04-24 ENCOUNTER — Other Ambulatory Visit: Payer: Self-pay | Admitting: Family Medicine

## 2011-04-26 ENCOUNTER — Encounter: Payer: Self-pay | Admitting: Family Medicine

## 2011-04-26 ENCOUNTER — Ambulatory Visit (INDEPENDENT_AMBULATORY_CARE_PROVIDER_SITE_OTHER): Payer: Medicare Other | Admitting: Family Medicine

## 2011-05-03 NOTE — Progress Notes (Signed)
  Subjective:    [name], male    DOB: Jun 18, 1934, 77 y.o.   MRN: [id number]  HPI    Review of Systems     Objective:   Physical Exam        Assessment & Plan:  Patient did not show for appointment - no charge

## 2011-06-11 ENCOUNTER — Other Ambulatory Visit: Payer: Self-pay | Admitting: Family Medicine

## 2011-06-13 ENCOUNTER — Ambulatory Visit (INDEPENDENT_AMBULATORY_CARE_PROVIDER_SITE_OTHER): Payer: Medicare Other | Admitting: Family Medicine

## 2011-06-13 ENCOUNTER — Encounter: Payer: Self-pay | Admitting: Family Medicine

## 2011-06-13 DIAGNOSIS — K219 Gastro-esophageal reflux disease without esophagitis: Secondary | ICD-10-CM

## 2011-06-13 DIAGNOSIS — IMO0001 Reserved for inherently not codable concepts without codable children: Secondary | ICD-10-CM

## 2011-06-13 DIAGNOSIS — Z23 Encounter for immunization: Secondary | ICD-10-CM

## 2011-06-13 DIAGNOSIS — E039 Hypothyroidism, unspecified: Secondary | ICD-10-CM

## 2011-06-13 DIAGNOSIS — E785 Hyperlipidemia, unspecified: Secondary | ICD-10-CM

## 2011-06-13 DIAGNOSIS — E1165 Type 2 diabetes mellitus with hyperglycemia: Secondary | ICD-10-CM

## 2011-06-13 DIAGNOSIS — I10 Essential (primary) hypertension: Secondary | ICD-10-CM

## 2011-06-13 MED ORDER — LEVOTHYROXINE SODIUM 50 MCG PO TABS: 50.0000 ug | ORAL_TABLET | Freq: Every day | ORAL | Status: DC

## 2011-06-13 MED ORDER — METFORMIN HCL 500 MG PO TABS: ORAL_TABLET | ORAL | Status: DC

## 2011-06-13 MED ORDER — OMEPRAZOLE 20 MG PO CPDR: 20.0000 mg | DELAYED_RELEASE_CAPSULE | Freq: Every day | ORAL | Status: DC

## 2011-06-13 MED ORDER — SILDENAFIL CITRATE 100 MG PO TABS: 50.0000 mg | ORAL_TABLET | Freq: Every day | ORAL | Status: AC | PRN

## 2011-06-13 MED ORDER — ATENOLOL 12.5 MG HALF TABLET: 12.5000 mg | ORAL_TABLET | Freq: Every day | ORAL | Status: DC

## 2011-06-13 MED ORDER — SIMVASTATIN 10 MG PO TABS: 10.0000 mg | ORAL_TABLET | Freq: Every day | ORAL | Status: DC

## 2011-06-13 NOTE — Patient Instructions (Addendum)
Stop the Tenoretic, and began Tenormin 12.5 mg one daily.  Begin metformin 500 mg take one half tablet daily in the morning before breakfast.  Return in two weeks for follow-up.  Check your blood sugar daily in the morning.  Continue your other medications  Stay on a sugar-free diet, and walk 20 minutes daily

## 2011-06-13 NOTE — Progress Notes (Signed)
  Subjective:    [name], male    DOB: 1933-10-02, 77 y.o.   MRN: [id number]  HPI  Gerald Leach is a 77 year old, married male, nonsmoker, who comes in today for Medicare wellness examination because of a history of hypertension, hypothyroidism, reflux esophagitis, hyperlipidemia, and a new problem of hearing loss.  His medications reviewed in detail and have been no changes in medication list is accurate.  His blood pressure on Tenoretic one half tab daily is 112/68.  Will decrease dose to 12.5 mg.  He gets routine eye care..... Recent bilateral cataract extraction and lens implant..... Hearing diminished.  Discussed getting a hearing aid, regular dental care, recent colonoscopy showed some polyps, benign, tetanus, 2005, Pneumovax, x 2, shingles 2011, seasonal flu shot today.  Cognitive function normally walks on a daily basis.  Home health safety reviewed.  No issues identified.  No guns in the house.  He does have a healthcare power of attorney and living will.  Review of Systems  Constitutional: Negative.   HENT: Negative.   Eyes: Negative.   Respiratory: Negative.   Cardiovascular: Negative.   Gastrointestinal: Negative.   Genitourinary: Negative.   Musculoskeletal: Negative.   Skin: Negative.   Neurological: Negative.   Hematological: Negative.   Psychiatric/Behavioral: Negative.        Objective:   Physical Exam  Constitutional: He is oriented to person, place, and time. He appears well-developed and well-nourished.  HENT:  Head: Normocephalic and atraumatic.  Right Ear: External ear normal.  Left Ear: External ear normal.  Nose: Nose normal.  Mouth/Throat: Oropharynx is clear and moist.       In the left ear canal was normal.  The right ear was full wax.  It was removed by irrigation by Fleet Contras  Eyes: Conjunctivae and EOM are normal. Pupils are equal, round, and reactive to light.  Neck: Normal range of motion. Neck supple. No JVD present. No tracheal deviation  present. No thyromegaly present.  Cardiovascular: Normal rate, regular rhythm, normal heart sounds and intact distal pulses.  Exam reveals no gallop and no friction rub.   No murmur heard. Pulmonary/Chest: Effort normal and breath sounds normal. No stridor. No respiratory distress. He has no wheezes. He has no rales. He exhibits no tenderness.  Abdominal: Soft. Bowel sounds are normal. He exhibits no distension and no mass. There is no tenderness. There is no rebound and no guarding.  Genitourinary: Rectum normal, prostate normal and penis normal. Guaiac negative stool. No penile tenderness.  Musculoskeletal: Normal range of motion. He exhibits no edema and no tenderness.  Lymphadenopathy:    He has no cervical adenopathy.  Neurological: He is alert and oriented to person, place, and time. He has normal reflexes. No cranial nerve deficit. He exhibits normal muscle tone.  Skin: Skin is warm and dry. No rash noted. No erythema. No pallor.  Psychiatric: He has a normal mood and affect. His behavior is normal. Judgment and thought content normal.          Assessment & Plan:  Healthy male.  Hypertension with now low blood pressure 112/68, decreased beta-blocker to 12.5 mg daily.  Hypothyroidism.  Continue Synthroid one daily.  Reflux esophagitis.  Continue Prilosec 20 daily.  Hyperlipidemia.  Continue Zocor and aspirin.  New-onset diabetes, type 2, planned a sugar-free diet, metformin 250 mg prior to breakfast.  Follow-up in two weeks

## 2011-06-15 ENCOUNTER — Encounter: Payer: Self-pay | Admitting: Family Medicine

## 2011-06-15 DIAGNOSIS — Z23 Encounter for immunization: Secondary | ICD-10-CM

## 2011-06-27 ENCOUNTER — Encounter: Payer: Self-pay | Admitting: Family Medicine

## 2011-06-27 ENCOUNTER — Ambulatory Visit (INDEPENDENT_AMBULATORY_CARE_PROVIDER_SITE_OTHER): Payer: Medicare Other | Admitting: Family Medicine

## 2011-06-27 VITALS — BP 130/80 | Temp 97.4°F | Wt 204.0 lb

## 2011-06-27 DIAGNOSIS — IMO0001 Reserved for inherently not codable concepts without codable children: Secondary | ICD-10-CM

## 2011-06-27 DIAGNOSIS — E1165 Type 2 diabetes mellitus with hyperglycemia: Secondary | ICD-10-CM

## 2011-06-27 MED ORDER — GLUCOSE BLOOD VI STRP: 1.0000 | ORAL_STRIP | Freq: Every day | Status: DC

## 2011-06-27 MED ORDER — ONETOUCH LANCETS MISC: 1.0000 | Freq: Every day | Status: DC

## 2011-06-27 NOTE — Progress Notes (Signed)
  Subjective:    [name], male    DOB: 1933-11-08, 78 y.o.   MRN: [id number]  HPI Gerald Leach is a 78 year old, married man nonsmoker, who comes in today for follow-up of new onset diabetes.   We saw him a couple weeks ago for a physical examination at that time.  His fasting blood sugar was 325.  We started him on a diet, exercise program, and added metformin 500 mg before breakfast.  The  He comes in today.  His fasting sugars were down in the 195 range.  No hypoglycemia, and no side effects from the med Sunol   Review of Systems General and metabolic review of systems otherwise negative    Objective:   Physical Exam  Well-developed well-nourished, male in no acute distress     Assessment & Plan:  Diabetes type II new onset, not at all yet.  Plan increase metformin to 500 b.i.d. Follow-up in two weeks.  I

## 2011-06-27 NOTE — Patient Instructions (Addendum)
Metformin,,,,,,,,,,,,,,,,,,,, a half a tablet before breakfast and at half a tablet before your evening meal  Check a fasting blood sugar daily in the morning.  Return in two weeks, sooner if any problems.   Record of blood sugar readings when u  Return.

## 2011-07-11 ENCOUNTER — Encounter: Payer: Self-pay | Admitting: Family Medicine

## 2011-07-11 ENCOUNTER — Ambulatory Visit (INDEPENDENT_AMBULATORY_CARE_PROVIDER_SITE_OTHER): Payer: Medicare Other | Admitting: Family Medicine

## 2011-07-11 VITALS — BP 118/80 | Temp 97.4°F | Wt 200.0 lb

## 2011-07-11 DIAGNOSIS — E1165 Type 2 diabetes mellitus with hyperglycemia: Secondary | ICD-10-CM

## 2011-07-11 DIAGNOSIS — IMO0001 Reserved for inherently not codable concepts without codable children: Secondary | ICD-10-CM

## 2011-07-11 NOTE — Progress Notes (Signed)
  Subjective:    [name], male    DOB: 07/06/1933, 78 y.o.   MRN: [id number]  HPI Gerald Leach is a 78 year old newly diagnosed diabetic who comes in today for follow-up.  He's currently on metformin 250 mg b.i.d. Blood sugars are averaging 170 to 200.  He is trying to follow a no sugar diet and walk on a regular basis   Review of Systems General and metabolic review of systems otherwise negative    Objective:   Physical Exam Well-developed well-nourished man in no acute distress.  Blood sugar today here in the office 3 hours postprandial 180       Assessment & Plan:  Diabetes type II not a goal increase metformin to 500 mg prior to breakfast.  Continue to2 50 mg prior to evening meal.  Follow-up in two weeks

## 2011-07-11 NOTE — Patient Instructions (Signed)
Take 500 mg of metformin prior to breakfast and continue the 250 mg.......... Prior to evening meal.  Check a fasting blood sugar daily in the morning.  Return in two weeks for follow-up

## 2011-07-20 ENCOUNTER — Telehealth: Payer: Self-pay | Admitting: Family Medicine

## 2011-07-20 ENCOUNTER — Other Ambulatory Visit: Payer: Self-pay | Admitting: *Deleted

## 2011-07-20 MED ORDER — GLUCOSE BLOOD VI STRP: 1.0000 | ORAL_STRIP | Freq: Every day | Status: DC

## 2011-07-20 NOTE — Telephone Encounter (Signed)
Pt req test strips for FreeStyle Lite to be [facility]

## 2011-07-25 ENCOUNTER — Encounter: Payer: Self-pay | Admitting: Family Medicine

## 2011-07-25 ENCOUNTER — Ambulatory Visit (INDEPENDENT_AMBULATORY_CARE_PROVIDER_SITE_OTHER): Payer: Medicare Other | Admitting: Family Medicine

## 2011-07-25 VITALS — BP 110/74 | Temp 97.4°F | Wt 198.0 lb

## 2011-07-25 DIAGNOSIS — IMO0001 Reserved for inherently not codable concepts without codable children: Secondary | ICD-10-CM

## 2011-07-25 DIAGNOSIS — E1165 Type 2 diabetes mellitus with hyperglycemia: Secondary | ICD-10-CM

## 2011-07-25 NOTE — Patient Instructions (Signed)
Continue your current medication  Remember to walk 30 minutes daily  Since your blood sugar is normal I would just check a blood sugar Monday Wednesday and Friday mornings  Followup in 3 months  Nonfasting lab work 1 week prior

## 2011-07-25 NOTE — Progress Notes (Signed)
  Subjective:    [name], male    DOB: 04/30/1934, 78 y.o.   MRN: [id number]  HPI Gerald Leach is a 78 year old married male nonsmoker who comes in today for followup of diabetes  We increased his metformin to 500 mg in the morning and 250 mg before evening meal because his blood sugars were not normal. Now his blood sugars were in the 90-120 range. No hypoglycemia   Review of Systems    general and metabolic review of systems otherwise negative Objective:   Physical Exam  Well-developed well-nourished male in no acute distress      Assessment & Plan:  Diabetes approaching goal continue current therapy followup A1c in 3 months

## 2011-08-14 ENCOUNTER — Other Ambulatory Visit: Payer: Self-pay | Admitting: *Deleted

## 2011-08-14 MED ORDER — METFORMIN HCL 500 MG PO TABS: 500.0000 mg | ORAL_TABLET | Freq: Two times a day (BID) | ORAL | Status: DC

## 2011-08-28 ENCOUNTER — Encounter: Payer: Medicare Other | Attending: Family Medicine | Admitting: *Deleted

## 2011-08-28 ENCOUNTER — Encounter: Payer: Self-pay | Admitting: *Deleted

## 2011-08-28 DIAGNOSIS — E1165 Type 2 diabetes mellitus with hyperglycemia: Secondary | ICD-10-CM

## 2011-08-28 DIAGNOSIS — Z713 Dietary counseling and surveillance: Secondary | ICD-10-CM | POA: Insufficient documentation

## 2011-08-28 DIAGNOSIS — E119 Type 2 diabetes mellitus without complications: Secondary | ICD-10-CM | POA: Insufficient documentation

## 2011-08-28 NOTE — Patient Instructions (Signed)
Plan:  Aim for 4 Carb Choices (60 grams) +/- 1 at each meal. 0-2 per snack if hungry Read Food Labels for total carb of foods Continue checking your BG 3 times a week, consider checking after meals occasionally

## 2011-08-28 NOTE — Progress Notes (Signed)
  Medical Nutrition Therapy:  Appt start time: 1100 end time:  1200.   Assessment:  Primary concerns today: patient here with his wife who appears very supportive and interested in learning more about how to prepare his meals. He has lost several pounds since his diagnosis in December and has been avoiding foods that contain sugar. He as also reduced his alcohol intake and would like to know if he should eliminate completely.  He tests his BG 3 times a week and the average has been 135 mg/dl.  MEDICATIONS: see list. Diabetes medication is currently Metformin   DIETARY INTAKE:  Usual eating pattern includes 3 meals and 1-2 snacks per day.  Everyday foods include good variety of all food groups.  Avoided foods include most high fat and high sugar foods.    24-hr recall:  B ( AM): unsweetened cereal with fresh fruit, 1 cup 2% milk, coffee, sugar free creamer Snk ( AM): fruit with cottage cheese, celery with pimento cheese or yogurt  L ( PM): sandwich from home, chips, milk or green tea, sf lemonade Snk ( PM): none D ( PM): home; lean meat, starch, vegetables either cooked or salad Snk ( PM): none (used to have dessert) Beverages: coffee, green tea, milk, diet drinks, rum  Usual physical activity: walks outside every day for 15-20 minutes,   Estimated energy needs: 1800 calories 200 g carbohydrates 135 g protein 50 g fat  Progress Towards Goal(s):  In progress.   Nutritional Diagnosis:  NB-1.1 Food and nutrition-related knowledge deficit As related to new diagnosis of diabetes.  As evidenced by no previous diabetes education.    Intervention:  Nutrition counseling and diabetes education provided to patient and his wife. Explained basic physiology of diabetes, medication action of Metformin, carb counting, benefit of self monitoring of BG, and reading food labels Plan:  Aim for 4 Carb Choices (60 grams) +/- 1 at each meal. 0-2 per snack if hungry Read Food Labels for total carb of  foods Continue checking your BG 3 times a week, consider checking after meals occasionally  Handouts given during visit include:  Living Well with Diabetes  Carb Counting and Reading Food Labels  Meal Plan Card  Monitoring/Evaluation:  Dietary intake, exercise, reading food labels, and body weight prn.

## 2011-10-15 ENCOUNTER — Other Ambulatory Visit (INDEPENDENT_AMBULATORY_CARE_PROVIDER_SITE_OTHER): Payer: Medicare Other

## 2011-10-15 DIAGNOSIS — E1165 Type 2 diabetes mellitus with hyperglycemia: Secondary | ICD-10-CM

## 2011-10-15 DIAGNOSIS — IMO0001 Reserved for inherently not codable concepts without codable children: Secondary | ICD-10-CM

## 2011-10-15 LAB — HEMOGLOBIN A1C: Hgb A1c MFr Bld: 6.3 % (ref 4.6–6.5)

## 2011-10-15 LAB — BASIC METABOLIC PANEL
CO2: 28 mEq/L (ref 19–32)
Calcium: 8.7 mg/dL (ref 8.4–10.5)
Creatinine, Ser: 0.8 mg/dL (ref 0.4–1.5)

## 2011-10-22 ENCOUNTER — Ambulatory Visit (INDEPENDENT_AMBULATORY_CARE_PROVIDER_SITE_OTHER): Payer: Medicare Other | Admitting: Family Medicine

## 2011-10-22 ENCOUNTER — Encounter: Payer: Self-pay | Admitting: Family Medicine

## 2011-10-22 DIAGNOSIS — E1165 Type 2 diabetes mellitus with hyperglycemia: Secondary | ICD-10-CM

## 2011-10-22 DIAGNOSIS — IMO0001 Reserved for inherently not codable concepts without codable children: Secondary | ICD-10-CM

## 2011-10-22 MED ORDER — GLUCOSE BLOOD VI STRP: 1.0000 | ORAL_STRIP | Freq: Every day | Status: DC

## 2011-10-22 NOTE — Patient Instructions (Signed)
Continue your current medications  Since you've gotten your blood sugar back to normal just check a fasting blood sugar once weekly  Return the fourth week in December for your annual physical examination  Nonfasting labs one week prior

## 2011-10-22 NOTE — Progress Notes (Signed)
  Subjective:    [name], male    DOB: 06-Nov-1933, 78 y.o.   MRN: [id number]  HPI Gerald Leach is a 78 year old married male nonsmoker who comes in today for followup of diabetes type 2  Week diagnosed and have diabetes in December at that time his hemoglobin A1c was 9.3. We sent him to nutrition now his hemoglobin A1c is 6.3 fasting blood sugar in the 120 to 1:30 range no hypoglycemia   Review of Systems General and metabolic review of systems otherwise negative    Objective:   Physical Exam Well-developed well-nourished man in acute distress       Assessment & Plan:  Diabetes type 2 at goal continue current therapy followup in December

## 2011-12-28 ENCOUNTER — Other Ambulatory Visit: Payer: Self-pay | Admitting: Family Medicine

## 2012-02-10 ENCOUNTER — Other Ambulatory Visit: Payer: Self-pay | Admitting: Family Medicine

## 2012-02-13 ENCOUNTER — Telehealth: Payer: Self-pay | Admitting: Family Medicine

## 2012-02-13 MED ORDER — GLUCOSE BLOOD VI STRP: 1.0000 | ORAL_STRIP | Freq: Every day | Status: DC

## 2012-02-13 NOTE — Telephone Encounter (Signed)
Patient called stating that he need a refill of his test strips sent to [facility] [address] for his freestyle meter. Please assist.

## 2012-02-25 ENCOUNTER — Ambulatory Visit (INDEPENDENT_AMBULATORY_CARE_PROVIDER_SITE_OTHER): Payer: Medicare Other | Admitting: Family Medicine

## 2012-02-25 ENCOUNTER — Encounter: Payer: Self-pay | Admitting: Family Medicine

## 2012-02-25 VITALS — BP 140/90

## 2012-02-25 DIAGNOSIS — S0181XA Laceration without foreign body of other part of head, initial encounter: Secondary | ICD-10-CM

## 2012-02-25 DIAGNOSIS — S0180XA Unspecified open wound of other part of head, initial encounter: Secondary | ICD-10-CM

## 2012-02-25 DIAGNOSIS — S20219A Contusion of unspecified front wall of thorax, initial encounter: Secondary | ICD-10-CM | POA: Insufficient documentation

## 2012-02-25 MED ORDER — TRAMADOL HCL 50 MG PO TABS: 50.0000 mg | ORAL_TABLET | Freq: Three times a day (TID) | ORAL | Status: DC | PRN

## 2012-02-25 NOTE — Progress Notes (Signed)
  Subjective:    [name], male    DOB: Jun 13, 1934, 78 y.o.   MRN: [id number]  HPI Gerald Leach is a 78 who comes in today for evaluation of a fall  Last night he tripped and fell at home his wife observed the fall he did not have a syncopal episode nor was he unconscious. However he did sustain a significant laceration between his eyebrows and would not go to the hospital. She treated him at home and he comes in today for evaluation of the laceration and also the chest wall pain.   Review of Systems General and musculoskeletal review of systems otherwise negative    Objective:   Physical Exam Well-developed and nourished male in no acute distress HEENT negative except for an abrasion right and left facial area plus a 1/2 inch laceration between his eyes. It is scabbed over. Cardiopulmonary exam negative except for abrasion of his ribs right posterior 01/24/2009       Assessment & Plan:  Facial laceration multiple facial contusions contusion chest wall plan rest at home Motrin

## 2012-02-25 NOTE — Patient Instructions (Signed)
Tramadol,,,,,,,,,,, 1/2-1 tablet 3 times a day as needed for pain  Cleaned the facial laceration twice daily and apply Band-Aid after 3 days leave it open to the air  Return in one week for followup

## 2012-03-03 ENCOUNTER — Ambulatory Visit (INDEPENDENT_AMBULATORY_CARE_PROVIDER_SITE_OTHER): Payer: Medicare Other | Admitting: Family Medicine

## 2012-03-03 ENCOUNTER — Encounter: Payer: Self-pay | Admitting: Family Medicine

## 2012-03-03 VITALS — BP 110/70 | Temp 98.0°F | Wt 185.0 lb

## 2012-03-03 DIAGNOSIS — Z23 Encounter for immunization: Secondary | ICD-10-CM

## 2012-03-03 DIAGNOSIS — S20219A Contusion of unspecified front wall of thorax, initial encounter: Secondary | ICD-10-CM

## 2012-03-03 DIAGNOSIS — I10 Essential (primary) hypertension: Secondary | ICD-10-CM

## 2012-03-03 DIAGNOSIS — IMO0001 Reserved for inherently not codable concepts without codable children: Secondary | ICD-10-CM

## 2012-03-03 DIAGNOSIS — E1165 Type 2 diabetes mellitus with hyperglycemia: Secondary | ICD-10-CM

## 2012-03-03 MED ORDER — HYDROCODONE-ACETAMINOPHEN 7.5-750 MG PO TABS: 1.0000 | ORAL_TABLET | Freq: Three times a day (TID) | ORAL | Status: DC | PRN

## 2012-03-03 NOTE — Progress Notes (Signed)
  Subjective:    [name], male    DOB: 25-Oct-1933, 78 y.o.   MRN: [id number]  HPI Gerald Leach is a 78 year old married male nonsmoker who comes in today accompanied by his wife for followup of a fall, hypertension, diabetes  He fell at home and sustained a small laceration between his eyes and multiple contusions of his face and back. No loss of consciousness his blood sugar was not low.  When we saw him last week his blood pressure was low therefore we decreased his Tenormin to 6.25 mg daily. BP today still low 110/70. We had him monitor his blood sugar daily to be sure he wasn't developing hypoglycemic episodes and blood sugars are all in the 90 to 1:30 range.  We gave him tramadol 1/2-1 tablet every 8 hours for pain however he says is not helping the back pain. No shortness of breath   Review of Systems    general and metabolic and musculoskeletal review otherwise negative Objective:   Physical Exam  Well-developed well-nourished male in acute distress examination of face shows a hematoma on the right side of his face quarter inch laceration between the eyebrows all healing well. The back shows some scarring in the upper back from the abrasion of the carpet lungs are clear      Assessment & Plan:  Contusion face and back continue tramadol during the day Vicodin each bedtime  Diabetes type 2 at goal continue current therapy  Hypertension blood pressure still too low stop beta blocker return in 2 weeks for followup

## 2012-03-03 NOTE — Patient Instructions (Signed)
Take the tramadol during the day for pain  At bedtime Vicodin one half to one tablet at bedtime  Continue the metformin for your blood sugar  Stop the Tenormin completely  Return in 2 weeks for followup

## 2012-03-15 ENCOUNTER — Other Ambulatory Visit: Payer: Self-pay | Admitting: Family Medicine

## 2012-03-17 ENCOUNTER — Encounter: Payer: Self-pay | Admitting: Family Medicine

## 2012-03-17 ENCOUNTER — Telehealth: Payer: Self-pay | Admitting: Family Medicine

## 2012-03-17 ENCOUNTER — Ambulatory Visit (INDEPENDENT_AMBULATORY_CARE_PROVIDER_SITE_OTHER): Payer: Medicare Other | Admitting: Family Medicine

## 2012-03-17 VITALS — BP 130/90 | Temp 97.6°F | Wt 183.0 lb

## 2012-03-17 DIAGNOSIS — E1165 Type 2 diabetes mellitus with hyperglycemia: Secondary | ICD-10-CM

## 2012-03-17 DIAGNOSIS — IMO0002 Reserved for concepts with insufficient information to code with codable children: Secondary | ICD-10-CM

## 2012-03-17 DIAGNOSIS — S20219A Contusion of unspecified front wall of thorax, initial encounter: Secondary | ICD-10-CM

## 2012-03-17 DIAGNOSIS — S0181XA Laceration without foreign body of other part of head, initial encounter: Secondary | ICD-10-CM

## 2012-03-17 DIAGNOSIS — S0180XA Unspecified open wound of other part of head, initial encounter: Secondary | ICD-10-CM

## 2012-03-17 DIAGNOSIS — IMO0001 Reserved for inherently not codable concepts without codable children: Secondary | ICD-10-CM

## 2012-03-17 NOTE — Patient Instructions (Signed)
Set up a time in November for your annual exam labs one week prior

## 2012-03-17 NOTE — Telephone Encounter (Signed)
[facility]  Says one tablet 2 times daily then it says one and a half tab daily   Dose: As Directed  Route: --  Frequency: As Directed   Dispense Quantity: 150 tablet  Refills: 3  Fills Remaining: 3           Sig: TAKE 1 TABLET BY MOUTH 2 TIMES DAILY WITH A MEAL. ONE AND HALF TAB DAILY

## 2012-03-17 NOTE — Progress Notes (Signed)
  Subjective:    [name], male    DOB: [birth year], 78 y.o.   MRN: [id number]  HPI Gerald Leach is a 77 year old married male nonsmoker who comes in today for followup of diabetes type 2 and a fall which she sustained a facial laceration also a contusion to his chest wall.  After the fall which we think was just an accident no hypoglycemia his blood sugar went up in the 180 range. He's been checking it daily and has been in the 07/08/1928 range. He takes 500 mg of metformin before breakfast and 250 mg prior to his evening meal. Blood pressure 130/90 on Tenormin 6.25 mg daily. No hypotension or hypoglycemia   Review of Systems    review of systems otherwise negative Objective:   Physical Exam Well-developed well-nourished male in no acute distress the facial laceration between his eyes is healing the contusion of his right cheek is also resolving slowly. Lungs are clear to auscultation the contusion on his chest wall has healed       Assessment & Plan:  Contusion chest wall and face secondary to fall at home resolving  Diabetes type 2 at goal continue current therapy

## 2012-03-18 ENCOUNTER — Telehealth: Payer: Self-pay | Admitting: Family Medicine

## 2012-03-18 NOTE — Telephone Encounter (Signed)
[facility] Would like [facility] on the SIG for this pt's Metformin 500mg . Please call/send in. Thanks.

## 2012-03-18 NOTE — Telephone Encounter (Signed)
Left message on machine for patient to call back with clarification

## 2012-03-19 NOTE — Telephone Encounter (Signed)
Spoke with wife and Rx has been picked up

## 2012-03-19 NOTE — Telephone Encounter (Signed)
Spoke with wife

## 2012-04-16 ENCOUNTER — Other Ambulatory Visit: Payer: Self-pay | Admitting: Family Medicine

## 2012-04-20 ENCOUNTER — Other Ambulatory Visit: Payer: Self-pay | Admitting: Family Medicine

## 2012-04-21 LAB — HM DIABETES EYE EXAM: HM Diabetic Eye Exam: NORMAL

## 2012-04-23 ENCOUNTER — Other Ambulatory Visit: Payer: Self-pay | Admitting: Family Medicine

## 2012-04-25 ENCOUNTER — Encounter: Payer: Self-pay | Admitting: Family Medicine

## 2012-04-29 ENCOUNTER — Other Ambulatory Visit: Payer: Self-pay | Admitting: Neurological Surgery

## 2012-04-29 DIAGNOSIS — M549 Dorsalgia, unspecified: Secondary | ICD-10-CM

## 2012-05-02 ENCOUNTER — Ambulatory Visit
Admission: RE | Admit: 2012-05-02 | Discharge: 2012-05-02 | Disposition: A | Discharge status: Home / Self Care | Payer: Medicare Other | Source: Ambulatory Visit | Attending: Neurological Surgery | Admitting: Neurological Surgery

## 2012-05-02 ENCOUNTER — Other Ambulatory Visit: Payer: Medicare Other

## 2012-05-02 VITALS — BP 144/79 | HR 62 | Ht 73.0 in | Wt 183.0 lb

## 2012-05-02 DIAGNOSIS — M549 Dorsalgia, unspecified: Secondary | ICD-10-CM

## 2012-05-02 MED ORDER — DIAZEPAM 5 MG PO TABS
5.0000 mg | ORAL_TABLET | Freq: Once | ORAL | Status: AC
  Administered 2012-05-02: 5 mg via ORAL

## 2012-05-02 MED ORDER — IOHEXOL 300 MG/ML  SOLN
10.0000 mL | Freq: Once | INTRAMUSCULAR | Status: AC | PRN
  Administered 2012-05-02: 10 mL via INTRATHECAL

## 2012-05-02 NOTE — Progress Notes (Signed)
Pt states she has been off tramadol for the past 2 days. 

## 2012-05-27 ENCOUNTER — Other Ambulatory Visit: Payer: Self-pay | Admitting: Family Medicine

## 2012-05-28 ENCOUNTER — Other Ambulatory Visit (INDEPENDENT_AMBULATORY_CARE_PROVIDER_SITE_OTHER): Payer: Medicare Other

## 2012-05-28 DIAGNOSIS — E1165 Type 2 diabetes mellitus with hyperglycemia: Secondary | ICD-10-CM

## 2012-05-28 DIAGNOSIS — S0181XA Laceration without foreign body of other part of head, initial encounter: Secondary | ICD-10-CM

## 2012-05-28 DIAGNOSIS — S20219A Contusion of unspecified front wall of thorax, initial encounter: Secondary | ICD-10-CM

## 2012-05-28 DIAGNOSIS — IMO0001 Reserved for inherently not codable concepts without codable children: Secondary | ICD-10-CM

## 2012-05-28 LAB — LIPID PANEL
Cholesterol: 152 mg/dL (ref 0–200)
Triglycerides: 115 mg/dL (ref 0.0–149.0)

## 2012-05-28 LAB — POCT URINALYSIS DIPSTICK
Blood, UA: NEGATIVE
Glucose, UA: NEGATIVE
Nitrite, UA: NEGATIVE
Urobilinogen, UA: 1

## 2012-05-28 LAB — CBC WITH DIFFERENTIAL/PLATELET
Basophils Absolute: 0 10*3/uL (ref 0.0–0.1)
Eosinophils Absolute: 0 10*3/uL (ref 0.0–0.7)
Lymphocytes Relative: 27.2 % (ref 12.0–46.0)
MCHC: 33.3 g/dL (ref 30.0–36.0)
Neutrophils Relative %: 64.3 % (ref 43.0–77.0)
RDW: 13.6 % (ref 11.5–14.6)

## 2012-05-28 LAB — BASIC METABOLIC PANEL
CO2: 29 mEq/L (ref 19–32)
Calcium: 9 mg/dL (ref 8.4–10.5)
Chloride: 97 mEq/L (ref 96–112)
Glucose, Bld: 130 mg/dL — ABNORMAL HIGH (ref 70–99)
Sodium: 137 mEq/L (ref 135–145)

## 2012-05-28 LAB — HEPATIC FUNCTION PANEL
AST: 17 U/L (ref 0–37)
Albumin: 4 g/dL (ref 3.5–5.2)
Alkaline Phosphatase: 70 U/L (ref 39–117)
Total Protein: 7.1 g/dL (ref 6.0–8.3)

## 2012-05-28 LAB — TSH: TSH: 4.69 u[IU]/mL (ref 0.35–5.50)

## 2012-06-04 ENCOUNTER — Encounter: Payer: Self-pay | Admitting: Family Medicine

## 2012-06-04 ENCOUNTER — Ambulatory Visit (INDEPENDENT_AMBULATORY_CARE_PROVIDER_SITE_OTHER): Payer: Medicare Other | Admitting: Family Medicine

## 2012-06-04 VITALS — BP 130/90 | Temp 97.6°F | Ht 70.5 in | Wt 189.0 lb

## 2012-06-04 DIAGNOSIS — Z0000 Encounter for general adult medical examination without abnormal findings: Secondary | ICD-10-CM

## 2012-06-04 DIAGNOSIS — E1165 Type 2 diabetes mellitus with hyperglycemia: Secondary | ICD-10-CM

## 2012-06-04 DIAGNOSIS — K219 Gastro-esophageal reflux disease without esophagitis: Secondary | ICD-10-CM

## 2012-06-04 DIAGNOSIS — S20219A Contusion of unspecified front wall of thorax, initial encounter: Secondary | ICD-10-CM

## 2012-06-04 DIAGNOSIS — E785 Hyperlipidemia, unspecified: Secondary | ICD-10-CM

## 2012-06-04 DIAGNOSIS — E039 Hypothyroidism, unspecified: Secondary | ICD-10-CM

## 2012-06-04 DIAGNOSIS — I10 Essential (primary) hypertension: Secondary | ICD-10-CM

## 2012-06-04 MED ORDER — METFORMIN HCL 500 MG PO TABS: ORAL_TABLET | ORAL | Status: DC

## 2012-06-04 MED ORDER — LEVOTHYROXINE SODIUM 50 MCG PO TABS: ORAL_TABLET | ORAL | Status: DC

## 2012-06-04 MED ORDER — SIMVASTATIN 10 MG PO TABS: ORAL_TABLET | ORAL | Status: DC

## 2012-06-04 MED ORDER — ATENOLOL 12.5 MG HALF TABLET: 12.5000 mg | ORAL_TABLET | Freq: Every day | ORAL | Status: DC

## 2012-06-04 MED ORDER — OMEPRAZOLE 20 MG PO CPDR: 20.0000 mg | DELAYED_RELEASE_CAPSULE | Freq: Every day | ORAL | Status: DC

## 2012-06-04 NOTE — Patient Instructions (Signed)
Continue your current medications  Followup in 6 months for your diabetes sooner if any problems

## 2012-06-04 NOTE — Progress Notes (Signed)
Subjective:    [name], male    DOB: 09/15/1933, 78 y.o.   MRN: [id number]  HPI Gerald Leach is a 78 year old male who comes in today for a Medicare wellness examination  He has a history of hypothyroidism and takes Synthroid 50 mcg daily  He has diabetes type 2 and is on 500 mg metformin in the morning and 250 at bedtime blood sugars averaging at home 123  He takes Prilosec 20 mg daily for reflux esophagitis  He takes simvastatin 10 mg daily for hyperlipidemia  He takes atenolol 12.5 mg daily for blood pressure control  This past fall he fell had a very serious contusion and facial hematoma that resolved. He continued to have back and chest pain. He went to orthopedics and saw Dr. Yetta Barre a neurosurgeon. They advised that a watchful waiting or surgery. He's elected for watchful waiting. He takes tramadol 50 mg in the morning and Vicodin one tablet at bedtime  The MRI of his spine which was done on November 11 shows a healing T7 compression fracture there was a initial block of the contrast suggesting a high-grade stenosis. He states he has no neurologic symptoms he just has persistent pain however it seems to be decreasing over time  Cognitive function normal he is unable to exercise because of his back pain home health safety reviewed no issues identified, no guns in the house, he does have a health care power of attorney and living will  Tetanus 2005, Pneumovax x2, seasonal flu shot 2013, shingles 2011   Review of Systems  Constitutional: Negative.   HENT: Negative.   Eyes: Negative.   Respiratory: Negative.   Cardiovascular: Negative.   Gastrointestinal: Negative.   Genitourinary: Negative.   Musculoskeletal: Negative.   Skin: Negative.   Neurological: Negative.   Hematological: Negative.   Psychiatric/Behavioral: Negative.        Objective:   Physical Exam  Constitutional: He is oriented to person, place, and time. He appears well-developed and  well-nourished.       Abnormal posture he didn't markedly forward at the neck  HENT:  Head: Normocephalic and atraumatic.  Right Ear: External ear normal.  Left Ear: External ear normal.  Nose: Nose normal.  Mouth/Throat: Oropharynx is clear and moist.  Eyes: Conjunctivae normal and EOM are normal. Pupils are equal, round, and reactive to light.  Neck: Normal range of motion. Neck supple. No JVD present. No tracheal deviation present. No thyromegaly present.  Cardiovascular: Normal rate, regular rhythm, normal heart sounds and intact distal pulses.  Exam reveals no gallop and no friction rub.   No murmur heard.      No carotid bruits aorta normal peripheral pulses normal  Pulmonary/Chest: Effort normal and breath sounds normal. No stridor. No respiratory distress. He has no wheezes. He has no rales. He exhibits no tenderness.  Abdominal: Soft. Bowel sounds are normal. He exhibits no distension and no mass. There is no tenderness. There is no rebound and no guarding.  Genitourinary: Rectum normal, prostate normal and penis normal. Guaiac negative stool. No penile tenderness.  Musculoskeletal: Normal range of motion. He exhibits no edema and no tenderness.  Lymphadenopathy:    He has no cervical adenopathy.  Neurological: He is alert and oriented to person, place, and time. He has normal reflexes. No cranial nerve deficit. He exhibits normal muscle tone.  Skin: Skin is warm and dry. No rash noted. No erythema. No pallor.  Psychiatric: He has a normal mood and affect.  His behavior is normal. Judgment and thought content normal.          Assessment & Plan:  Healthy male  Hypothyroidism continue Synthroid  Diabetes type 2 continue metformin  Reflux esophagitis continue Prilosec  Hyperlipidemia continue simvastatin  Hypertension continue atenolol  Compression fracture T7 healing slowly continue current medication which is tramadol 50 mg in the morning and 1 Vicodin at bedtime.

## 2012-06-19 ENCOUNTER — Other Ambulatory Visit: Payer: Self-pay | Admitting: Family Medicine

## 2012-07-19 ENCOUNTER — Other Ambulatory Visit: Payer: Self-pay | Admitting: Family Medicine

## 2012-07-20 ENCOUNTER — Other Ambulatory Visit: Payer: Self-pay | Admitting: Family Medicine

## 2012-09-18 ENCOUNTER — Telehealth: Payer: Self-pay | Admitting: Family Medicine

## 2012-09-18 ENCOUNTER — Encounter (HOSPITAL_COMMUNITY): Payer: Self-pay | Admitting: *Deleted

## 2012-09-18 ENCOUNTER — Inpatient Hospital Stay (HOSPITAL_COMMUNITY)
Admission: EM | Admit: 2012-09-18 | Discharge: 2012-09-30 | DRG: 025 | Disposition: A | Discharge status: Rehabilitation Facility | Payer: Medicare Other | Attending: Neurosurgery | Admitting: Neurosurgery

## 2012-09-18 ENCOUNTER — Emergency Department (HOSPITAL_COMMUNITY): Payer: Medicare Other

## 2012-09-18 DIAGNOSIS — K219 Gastro-esophageal reflux disease without esophagitis: Secondary | ICD-10-CM | POA: Diagnosis present

## 2012-09-18 DIAGNOSIS — E1165 Type 2 diabetes mellitus with hyperglycemia: Secondary | ICD-10-CM

## 2012-09-18 DIAGNOSIS — Z9889 Other specified postprocedural states: Secondary | ICD-10-CM

## 2012-09-18 DIAGNOSIS — I6200 Nontraumatic subdural hemorrhage, unspecified: Principal | ICD-10-CM | POA: Diagnosis present

## 2012-09-18 DIAGNOSIS — G9340 Encephalopathy, unspecified: Secondary | ICD-10-CM | POA: Diagnosis present

## 2012-09-18 DIAGNOSIS — I498 Other specified cardiac arrhythmias: Secondary | ICD-10-CM | POA: Diagnosis not present

## 2012-09-18 DIAGNOSIS — IMO0002 Reserved for concepts with insufficient information to code with codable children: Secondary | ICD-10-CM

## 2012-09-18 DIAGNOSIS — R1310 Dysphagia, unspecified: Secondary | ICD-10-CM

## 2012-09-18 DIAGNOSIS — J95821 Acute postprocedural respiratory failure: Secondary | ICD-10-CM

## 2012-09-18 DIAGNOSIS — R1319 Other dysphagia: Secondary | ICD-10-CM | POA: Diagnosis not present

## 2012-09-18 DIAGNOSIS — I10 Essential (primary) hypertension: Secondary | ICD-10-CM

## 2012-09-18 DIAGNOSIS — E785 Hyperlipidemia, unspecified: Secondary | ICD-10-CM | POA: Diagnosis present

## 2012-09-18 DIAGNOSIS — G8190 Hemiplegia, unspecified affecting unspecified side: Secondary | ICD-10-CM | POA: Diagnosis not present

## 2012-09-18 DIAGNOSIS — S065X9A Traumatic subdural hemorrhage with loss of consciousness of unspecified duration, initial encounter: Secondary | ICD-10-CM

## 2012-09-18 DIAGNOSIS — IMO0001 Reserved for inherently not codable concepts without codable children: Secondary | ICD-10-CM | POA: Diagnosis present

## 2012-09-18 DIAGNOSIS — I4891 Unspecified atrial fibrillation: Secondary | ICD-10-CM | POA: Diagnosis not present

## 2012-09-18 DIAGNOSIS — S065XAA Traumatic subdural hemorrhage with loss of consciousness status unknown, initial encounter: Secondary | ICD-10-CM

## 2012-09-18 DIAGNOSIS — E039 Hypothyroidism, unspecified: Secondary | ICD-10-CM

## 2012-09-18 DIAGNOSIS — N529 Male erectile dysfunction, unspecified: Secondary | ICD-10-CM | POA: Diagnosis present

## 2012-09-18 DIAGNOSIS — E873 Alkalosis: Secondary | ICD-10-CM | POA: Diagnosis not present

## 2012-09-18 HISTORY — DX: Cerebral aneurysm, nonruptured: I67.1

## 2012-09-18 LAB — POCT I-STAT, CHEM 8
BUN: 11 mg/dL (ref 6–23)
Calcium, Ion: 1.18 mmol/L (ref 1.13–1.30)
Chloride: 95 meq/L — ABNORMAL LOW (ref 96–112)
Creatinine, Ser: 0.9 mg/dL (ref 0.50–1.35)
Glucose, Bld: 136 mg/dL — ABNORMAL HIGH (ref 70–99)
HCT: 48 % (ref 39.0–52.0)
Hemoglobin: 16.3 g/dL (ref 13.0–17.0)
Potassium: 4.4 meq/L (ref 3.5–5.1)
Sodium: 133 mEq/L — ABNORMAL LOW (ref 135–145)
TCO2: 30 mmol/L (ref 0–100)

## 2012-09-18 LAB — POCT I-STAT TROPONIN I: Troponin i, poc: 0 ng/mL (ref 0.00–0.08)

## 2012-09-18 LAB — PROTIME-INR
INR: 0.99 (ref 0.00–1.49)
Prothrombin Time: 13 s (ref 11.6–15.2)

## 2012-09-18 LAB — CBC WITH DIFFERENTIAL/PLATELET
Basophils Absolute: 0 10*3/uL (ref 0.0–0.1)
Basophils Relative: 0 % (ref 0–1)
Eosinophils Absolute: 0 10*3/uL (ref 0.0–0.7)
Lymphs Abs: 1.9 10*3/uL (ref 0.7–4.0)
MCH: 27.7 pg (ref 26.0–34.0)
Neutrophils Relative %: 74 % (ref 43–77)
Platelets: 262 10*3/uL (ref 150–400)
RBC: 5.45 MIL/uL (ref 4.22–5.81)
RDW: 13 % (ref 11.5–15.5)

## 2012-09-18 LAB — URINALYSIS, ROUTINE W REFLEX MICROSCOPIC
Glucose, UA: NEGATIVE mg/dL
Leukocytes, UA: NEGATIVE
Nitrite: NEGATIVE
Protein, ur: NEGATIVE mg/dL
Urobilinogen, UA: 1 mg/dL (ref 0.0–1.0)

## 2012-09-18 LAB — APTT: aPTT: 41 s — ABNORMAL HIGH (ref 24–37)

## 2012-09-18 MED ORDER — SUCCINYLCHOLINE CHLORIDE 20 MG/ML IJ SOLN
INTRAMUSCULAR | Status: AC
  Filled 2012-09-18: qty 5

## 2012-09-18 MED ORDER — ONDANSETRON HCL 4 MG/2ML IJ SOLN
4.0000 mg | Freq: Once | INTRAMUSCULAR | Status: AC
  Administered 2012-09-18: 4 mg via INTRAVENOUS
  Filled 2012-09-18: qty 2

## 2012-09-18 MED ORDER — LIDOCAINE HCL (CARDIAC) 20 MG/ML IV SOLN
INTRAVENOUS | Status: AC
  Filled 2012-09-18: qty 5

## 2012-09-18 MED ORDER — ROCURONIUM BROMIDE 50 MG/5ML IV SOLN
INTRAVENOUS | Status: AC
  Filled 2012-09-18: qty 2

## 2012-09-18 MED ORDER — ETOMIDATE 2 MG/ML IV SOLN
INTRAVENOUS | Status: AC
  Filled 2012-09-18: qty 20

## 2012-09-18 NOTE — ED Provider Notes (Signed)
Medical screening examination/treatment/procedure(s) were conducted as a shared visit with non-physician practitioner(s) and myself.  I personally evaluated the patient during the encounter   Pt with altered mentation over at least 24 hours. Unclear if head injury occurred.  Family has left due to poor health of spouse.  Pt is presumed to be full code.  No prior records to indicate either way.  Pt is on baby aspirin only.  Head CT shows midline shift with likely acute and chronic subdural blood on my interpretation.    Pt is arouse able, slurred speech, has preserved following of simple commands, has finger to nose intact, although weakly.  Pt has weakness to both legs, seems symmetric.  Pt's GCS is 13.  Have asked RN to contact family to return, will discuss with Dr. Phoebe Perch.  Will need to consider protection of airway as his mentation is more sedate, although he continues to awaken to voice and is conversant here.     CRITICAL CARE Performed by: Lear Ng   Total critical care time: 30 min  Critical care time was exclusive of separately billable procedures and treating other patients.  Critical care was necessary to treat or prevent imminent or life-threatening deterioration.  Critical care was time spent personally by me on the following activities: development of treatment plan with patient and/or surrogate as well as nursing, discussions with consultants, evaluation of patient's response to treatment, examination of patient, obtaining history from patient or surrogate, ordering and performing treatments and interventions, ordering and review of laboratory studies, ordering and review of radiographic studies, pulse oximetry and re-evaluation of patient's condition.    Impression:  Intracranial hemorrhage, acute, initial encounter Altered mental status      11:35 PM Spoke to Dr. Phoebe Perch who recommends transfer to [facility] Neuro ICU.  He prefers not to sedate and intubate pt if we  can help it.  Pt's mentation remains about the same as before.  Will treat nausea with zofran, [facility] notified.  I updated family who just arrived to the hospital.  Dr. Phoebe Perch does not recommend antiepileptics nor steroids at this time.     Gavin Pound. Oletta Lamas, MD 09/18/12 2344

## 2012-09-18 NOTE — ED Notes (Signed)
Pt is alert but groggy.  Pt is slightly confused about the date but is oriented otherwise.  Pt does not currently c/o any pain.

## 2012-09-18 NOTE — ED Notes (Signed)
Pt's wife: Johnathyn Viscomi: [phone]  Pt's sone: Jacques Earthly: [phone]

## 2012-09-18 NOTE — Telephone Encounter (Signed)
Patient Information:  Caller Name: Britta Mccreedy  Phone: [phone]  Patient: Gerald, Leach  Gender: Male  DOB: 1933-11-12  Age: 79 Years  PCP: [facility])  Office Follow Up:  Does the office need to follow up with this patient?: No  Instructions For The Office: N/A  RN Note:  [facility] per practice preference.  Caller agreed.  Symptoms  Reason For Call & Symptoms: Spouse/ Britta Mccreedy reports patient fell this am and "has been acting strange for 2-3 days, but he kept going to work until today"; he is currently in the bed resting/ sleeping.  It was not a witnessed fall and caller states she is not sure what happened.  He was assisted to bed by staff who are there to assist caller following surgery.  Reviewed Health History In EMR: Yes  Reviewed Medications In EMR: Yes  Reviewed Allergies In EMR: Yes  Reviewed Surgeries / Procedures: Yes  Date of Onset of Symptoms: 09/18/2012  Guideline(s) Used:  No Protocol Available - Sick Adult  Disposition Per Guideline:   Go to ED Now  Reason For Disposition Reached:   Nursing judgment  Advice Given:  Call Back If:  New symptoms develop  You become worse.  Patient Will Follow Care Advice:  YES

## 2012-09-18 NOTE — ED Notes (Signed)
Patient transported to CT 

## 2012-09-18 NOTE — ED Notes (Signed)
Per EMS report: pt from home: pt's wife c/o that pt his more confused today and urinating more frequently.  Pt report's skin is cool and dry and EMS reports pt is able to answer questions appropriately.  Pt hx DM.  Pt currently actively vomiting.  BP: 172/100, HR: 70, CBG: 191, 97%RA.  Pt c/o of chronic back pain and a headache.

## 2012-09-18 NOTE — ED Notes (Signed)
[id number]<AV> Expected date:<BR> Expected time:<BR> Means of arrival:<BR> Comments:<BR> EMS/78 yo male from home with intermittent confusion per family/polyuria today

## 2012-09-18 NOTE — ED Notes (Signed)
Report to [facility] RN on 3100-[facility] at bedside to transport pt.

## 2012-09-18 NOTE — ED Provider Notes (Signed)
History     CSN: [id number]  Arrival date & time 09/18/12  4098   First MD Initiated Contact with Patient 09/18/12 1952      No chief complaint on file.   (Consider location/radiation/quality/duration/timing/severity/associated sxs/prior treatment) HPI Comments: Patient presents to the emergency department with chief complaint of altered mental status. He is accompanied by his wife and son, who stated the patient has been acting more withdrawn lately. Patient is unable to provide his own history. Level 5 caveat applies. Per the patient's wife, he has had increasing urinary frequency, with some incontinence of urine. She also endorses some vomiting.  Son states that he went to lunch with the patient yesterday, and said that he was acting more withdrawn than usual.  No observed falls or mechanical trauma.  The history is provided by the patient. No language interpreter was used.    Past Medical History  Diagnosis Date  . Hypertension   . Colonic polyp 2003  . GERD (gastroesophageal reflux disease)   . ED (erectile dysfunction)   . Hyperlipidemia   . Hypothyroidism     Past Surgical History  Procedure Laterality Date  . Brain aneurysm surgery      at [facility]  . Carotid artery aneurysm    . Colonoscopy      polyps  . Right hernia      History reviewed. No pertinent family history.  History  Substance Use Topics  . Smoking status: Never Smoker   . Smokeless tobacco: Never Used  . Alcohol Use: Yes     Comment: rum mixed in diet coke 3 a week      Review of Systems  Unable to perform ROS: Mental status change    Allergies  Review of patient's allergies indicates no known allergies.  Home Medications   Current Outpatient Rx  Name  Route  Sig  Dispense  Refill  . aspirin EC 81 MG tablet   Oral   Take 81 mg by mouth at bedtime.         Marland Kitchen atenolol (TENORMIN) 12.5 mg TABS   Oral   Take 0.5 tablets (12.5 mg total) by mouth daily.   100 tablet   3   .  HYDROcodone-acetaminophen (NORCO) 7.5-325 MG per tablet   Oral   Take 1 tablet by mouth every 6 (six) hours as needed for pain.          Marland Kitchen levothyroxine (SYNTHROID, LEVOTHROID) 50 MCG tablet   Oral   Take 50 mcg by mouth daily before breakfast.         . metFORMIN (GLUCOPHAGE) 500 MG tablet   Oral   Take 500 mg by mouth 2 (two) times daily with a meal.         . omeprazole (PRILOSEC) 20 MG capsule   Oral   Take 20 mg by mouth every Monday, Wednesday, and Friday.          . simvastatin (ZOCOR) 10 MG tablet   Oral   Take 10 mg by mouth at bedtime.         . traMADol (ULTRAM) 50 MG tablet   Oral   Take 50 mg by mouth every 8 (eight) hours as needed for pain.         Marland Kitchen glucose blood (FREESTYLE LITE) test strip   Other   1 each by Other route daily. Use as instructed   100 each   3   . ONE TOUCH LANCETS MISC  Does not apply   1 each by Does not apply route daily.   200 each   0     DM II - 250.00     BP 159/87  Pulse 63  Temp(Src) 97.5 F (36.4 C) (Oral)  Resp 18  SpO2 97%  Physical Exam  Nursing note and vitals reviewed. Constitutional: He appears well-developed and well-nourished.  HENT:  Head: Normocephalic and atraumatic.  Eyes: Conjunctivae and EOM are normal. Pupils are equal, round, and reactive to light.  Neck: Normal range of motion. Neck supple.  Cardiovascular: Normal rate, regular rhythm and normal heart sounds.   Pulmonary/Chest: Effort normal and breath sounds normal. No respiratory distress. He has no wheezes. He has no rales. He exhibits no tenderness.  Abdominal: Soft. Bowel sounds are normal.  Musculoskeletal: Normal range of motion.  Neurological:  Able to follow commands, normal finger to nose  Skin: Skin is warm and dry.  Psychiatric: He has a normal mood and affect. His behavior is normal. Judgment and thought content normal.    ED Course  Procedures (including critical care time)  Labs Reviewed  CBC WITH DIFFERENTIAL   URINALYSIS, ROUTINE W REFLEX MICROSCOPIC   Results for orders placed during the hospital encounter of 09/18/12  CBC WITH DIFFERENTIAL      Result Value Range   WBC 11.5 (*) 4.0 - 10.5 K/uL   RBC 5.45  4.22 - 5.81 MIL/uL   Hemoglobin 15.1  13.0 - 17.0 g/dL   HCT 16.1  09.6 - 04.5 %   MCV 82.2  78.0 - 100.0 fL   MCH 27.7  26.0 - 34.0 pg   MCHC 33.7  30.0 - 36.0 g/dL   RDW 40.9  81.1 - 91.4 %   Platelets 262  150 - 400 K/uL   Neutrophils Relative 74  43 - 77 %   Neutro Abs 8.6 (*) 1.7 - 7.7 K/uL   Lymphocytes Relative 17  12 - 46 %   Lymphs Abs 1.9  0.7 - 4.0 K/uL   Monocytes Relative 9  3 - 12 %   Monocytes Absolute 1.0  0.1 - 1.0 K/uL   Eosinophils Relative 0  0 - 5 %   Eosinophils Absolute 0.0  0.0 - 0.7 K/uL   Basophils Relative 0  0 - 1 %   Basophils Absolute 0.0  0.0 - 0.1 K/uL  URINALYSIS, ROUTINE W REFLEX MICROSCOPIC      Result Value Range   Color, Urine YELLOW  YELLOW   APPearance HAZY (*) CLEAR   Specific Gravity, Urine 1.020  1.005 - 1.030   pH 7.0  5.0 - 8.0   Glucose, UA NEGATIVE  NEGATIVE mg/dL   Hgb urine dipstick NEGATIVE  NEGATIVE   Bilirubin Urine NEGATIVE  NEGATIVE   Ketones, ur NEGATIVE  NEGATIVE mg/dL   Protein, ur NEGATIVE  NEGATIVE mg/dL   Urobilinogen, UA 1.0  0.0 - 1.0 mg/dL   Nitrite NEGATIVE  NEGATIVE   Leukocytes, UA NEGATIVE  NEGATIVE  APTT      Result Value Range   aPTT 41 (*) 24 - 37 seconds  PROTIME-INR      Result Value Range   Prothrombin Time 13.0  11.6 - 15.2 seconds   INR 0.99  0.00 - 1.49  POCT I-STAT, CHEM 8      Result Value Range   Sodium 133 (*) 135 - 145 mEq/L   Potassium 4.4  3.5 - 5.1 mEq/L   Chloride 95 (*) 96 -  112 mEq/L   BUN 11  6 - 23 mg/dL   Creatinine, Ser 4.09  0.50 - 1.35 mg/dL   Glucose, Bld 811 (*) 70 - 99 mg/dL   Calcium, Ion 9.14  7.82 - 1.30 mmol/L   TCO2 30  0 - 100 mmol/L   Hemoglobin 16.3  13.0 - 17.0 g/dL   HCT 95.6  21.3 - 08.6 %  POCT I-STAT TROPONIN I      Result Value Range   Troponin i, poc  0.00  0.00 - 0.08 ng/mL   Comment 3            Dg Chest 2 View  09/18/2012  *RADIOLOGY REPORT*  Clinical Data: Altered mental status  CHEST - 2 VIEW  Comparison: Thoracic spine 07/22/2012  Findings: Cardiac enlargement and tortuous aorta.  Negative for heart failure.  Negative for pleural effusion.  Negative for pneumonia.  Chronic rib fractures bilaterally.  Severe chronic fracture of the thoracic mid spine, unchanged.  IMPRESSION: No acute cardiopulmonary abnormality.   Original Report Authenticated By: Janeece Riggers, M.D.    Ct Head Wo Contrast  09/18/2012  *RADIOLOGY REPORT*  Clinical Data: Increasing confusion and more frequent urination. Vomiting.  CT HEAD WITHOUT CONTRAST  Technique:  Contiguous axial images were obtained from the base of the skull through the vertex without contrast.  Comparison: None.  Findings: Streak artifact from dental hardware limits visualization of the posterior fossa and skull base.  There is a right frontal temporoparietal subdural hematoma measuring about 11 mm maximal depth.  There is next density within the hematoma with some increased attenuation and some isoattenuating foci suggesting acute to subacute hemorrhage.  There is significant mass effect with effacement of sulci and lateral ventricles and right to left midline shift proximally 14 mm.  There is subfalcine herniation. Underlying changes of chronic atrophy and small vessel ischemia. There is a mass in the right suprasellar region with peripheral calcification measuring about 1.7 x 1.9 cm.  No dislocation, this could represent calcified parenchymal mass or perhaps a large aneurysm.  Correlation with any old outside studies would be useful in further evaluation.  Otherwise MRI is suggested for further evaluation.  No depressed skull fractures.  IMPRESSION: Large right subdural hematoma with acute and subacute features. Significant mass effect with 14 mm right to left midline shift and anterior subfalcine herniation.   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  Consider MRI for further evaluation if this has not been previously demonstrated on old outside studies.  Results were discussed by telephone with PA Roxy Horseman at 2319 hours on 09/18/2012.   Original Report Authenticated By: Burman Nieves, M.D.      ED ECG REPORT  I personally interpreted this EKG   Date: 09/18/2012   Rate: 61  Rhythm: normal sinus rhythm  QRS Axis: normal  Intervals: normal  ST/T Wave abnormalities: nonspecific T wave changes  Conduction Disutrbances:none  Narrative Interpretation:   Old EKG Reviewed: none available    1. Subdural hematoma       MDM  Patient with AMS.  Suspect UTI, as the patient had increased urinary frequency.  Will check basic labs and urine.  Discussed with Dr. Oletta Lamas who also recommends CT head and troponin.  Patient found to have subdural hematoma.  Dr. Oletta Lamas spoke with neurosurgery who recommends that we transfer the patient to [facility] Neuro ICU.  Discussed intubating the patient, which Dr. Phoebe Perch recommended that we  hold off.  Patient's mental status is unchanged during his ED stay.  He is able to follow basic commands, and is arousable to voice.         Roxy Horseman, PA-C 09/19/12 0009

## 2012-09-18 NOTE — ED Notes (Signed)
Patient transported to X-ray 

## 2012-09-19 ENCOUNTER — Inpatient Hospital Stay (HOSPITAL_COMMUNITY): Payer: Medicare Other

## 2012-09-19 ENCOUNTER — Encounter (HOSPITAL_COMMUNITY): Payer: Self-pay | Admitting: *Deleted

## 2012-09-19 ENCOUNTER — Encounter (HOSPITAL_COMMUNITY): Payer: Self-pay | Admitting: Anesthesiology

## 2012-09-19 ENCOUNTER — Inpatient Hospital Stay (HOSPITAL_COMMUNITY): Payer: Medicare Other | Admitting: Anesthesiology

## 2012-09-19 ENCOUNTER — Encounter (HOSPITAL_COMMUNITY)
Admission: EM | Disposition: A | Discharge status: Rehabilitation Facility | Payer: Self-pay | Source: Home / Self Care | Attending: Neurosurgery

## 2012-09-19 DIAGNOSIS — E039 Hypothyroidism, unspecified: Secondary | ICD-10-CM

## 2012-09-19 DIAGNOSIS — IMO0001 Reserved for inherently not codable concepts without codable children: Secondary | ICD-10-CM

## 2012-09-19 DIAGNOSIS — J95821 Acute postprocedural respiratory failure: Secondary | ICD-10-CM

## 2012-09-19 DIAGNOSIS — I6200 Nontraumatic subdural hemorrhage, unspecified: Principal | ICD-10-CM

## 2012-09-19 DIAGNOSIS — I10 Essential (primary) hypertension: Secondary | ICD-10-CM

## 2012-09-19 HISTORY — PX: CRANIOTOMY: SHX93

## 2012-09-19 LAB — CBC
HCT: 41.2 % (ref 39.0–52.0)
Hemoglobin: 14.4 g/dL (ref 13.0–17.0)
MCH: 27.9 pg (ref 26.0–34.0)
MCHC: 35 g/dL (ref 30.0–36.0)
MCV: 79.8 fL (ref 78.0–100.0)

## 2012-09-19 LAB — GLUCOSE, CAPILLARY
Glucose-Capillary: 135 mg/dL — ABNORMAL HIGH (ref 70–99)
Glucose-Capillary: 138 mg/dL — ABNORMAL HIGH (ref 70–99)

## 2012-09-19 LAB — BASIC METABOLIC PANEL
BUN: 10 mg/dL (ref 6–23)
Chloride: 98 mEq/L (ref 96–112)
Glucose, Bld: 182 mg/dL — ABNORMAL HIGH (ref 70–99)
Potassium: 3.7 mEq/L (ref 3.5–5.1)

## 2012-09-19 LAB — MRSA PCR SCREENING: MRSA by PCR: NEGATIVE

## 2012-09-19 LAB — BLOOD GAS, ARTERIAL
Bicarbonate: 21.6 mEq/L (ref 20.0–24.0)
PEEP: 5 cmH2O
Patient temperature: 98.6
pH, Arterial: 7.433 (ref 7.350–7.450)
pO2, Arterial: 117 mmHg — ABNORMAL HIGH (ref 80.0–100.0)

## 2012-09-19 SURGERY — CRANIOTOMY HEMATOMA EVACUATION SUBDURAL
Anesthesia: General | Site: Head | Laterality: Right | Wound class: Clean

## 2012-09-19 MED ORDER — LABETALOL HCL 5 MG/ML IV SOLN
10.0000 mg | INTRAVENOUS | Status: DC | PRN
  Administered 2012-09-19 (×2): 20 mg via INTRAVENOUS
  Administered 2012-09-21 – 2012-09-22 (×2): 10 mg via INTRAVENOUS
  Filled 2012-09-19 (×3): qty 4

## 2012-09-19 MED ORDER — ACETAMINOPHEN 325 MG PO TABS
650.0000 mg | ORAL_TABLET | ORAL | Status: DC | PRN
  Administered 2012-09-21 – 2012-09-28 (×4): 650 mg via ORAL
  Filled 2012-09-19 (×4): qty 2

## 2012-09-19 MED ORDER — ROCURONIUM BROMIDE 100 MG/10ML IV SOLN
INTRAVENOUS | Status: DC | PRN
  Administered 2012-09-19 (×2): 50 mg via INTRAVENOUS

## 2012-09-19 MED ORDER — PROPOFOL 10 MG/ML IV BOLUS
INTRAVENOUS | Status: DC | PRN
  Administered 2012-09-19: 50 mg via INTRAVENOUS
  Administered 2012-09-19: 150 mg via INTRAVENOUS

## 2012-09-19 MED ORDER — ONDANSETRON HCL 4 MG/2ML IJ SOLN: 4.0000 mg | Freq: Four times a day (QID) | INTRAMUSCULAR | Status: DC | PRN

## 2012-09-19 MED ORDER — FENTANYL CITRATE 0.05 MG/ML IJ SOLN
25.0000 ug | INTRAMUSCULAR | Status: DC | PRN
  Administered 2012-09-19: 50 ug via INTRAVENOUS
  Filled 2012-09-19: qty 2

## 2012-09-19 MED ORDER — SODIUM CHLORIDE 0.9 % IV SOLN
INTRAVENOUS | Status: AC
  Administered 2012-09-19 (×2): via INTRAVENOUS
  Filled 2012-09-19: qty 500

## 2012-09-19 MED ORDER — LEVOTHYROXINE SODIUM 50 MCG PO TABS
50.0000 ug | ORAL_TABLET | Freq: Every day | ORAL | Status: DC
  Administered 2012-09-19: 50 ug via ORAL
  Filled 2012-09-19 (×3): qty 1

## 2012-09-19 MED ORDER — HYDRALAZINE HCL 20 MG/ML IJ SOLN
10.0000 mg | INTRAMUSCULAR | Status: DC | PRN
  Administered 2012-09-19 – 2012-09-21 (×2): 20 mg via INTRAVENOUS
  Filled 2012-09-19 (×2): qty 1

## 2012-09-19 MED ORDER — ATENOLOL 12.5 MG HALF TABLET
12.5000 mg | ORAL_TABLET | Freq: Every day | ORAL | Status: DC
  Filled 2012-09-19 (×2): qty 1

## 2012-09-19 MED ORDER — LIDOCAINE-EPINEPHRINE 1 %-1:100000 IJ SOLN
INTRAMUSCULAR | Status: DC | PRN
  Administered 2012-09-19: 20 mL via INTRADERMAL

## 2012-09-19 MED ORDER — MORPHINE SULFATE 2 MG/ML IJ SOLN
1.0000 mg | INTRAMUSCULAR | Status: DC | PRN
  Administered 2012-09-19: 1 mg via INTRAVENOUS
  Administered 2012-09-19: 2 mg via INTRAVENOUS
  Administered 2012-09-25: 1 mg via INTRAVENOUS
  Administered 2012-09-26: 2 mg via INTRAVENOUS
  Filled 2012-09-19 (×4): qty 1

## 2012-09-19 MED ORDER — BACITRACIN 50000 UNITS IM SOLR
INTRAMUSCULAR | Status: AC
  Filled 2012-09-19: qty 1

## 2012-09-19 MED ORDER — SODIUM CHLORIDE 0.9 % IV SOLN
500.0000 mg | Freq: Two times a day (BID) | INTRAVENOUS | Status: DC
  Administered 2012-09-19 – 2012-09-26 (×15): 500 mg via INTRAVENOUS
  Filled 2012-09-19 (×18): qty 5

## 2012-09-19 MED ORDER — OXYCODONE HCL 5 MG PO TABS: 5.0000 mg | ORAL_TABLET | Freq: Once | ORAL | Status: AC | PRN

## 2012-09-19 MED ORDER — ONDANSETRON HCL 4 MG/2ML IJ SOLN: 4.0000 mg | INTRAMUSCULAR | Status: DC | PRN

## 2012-09-19 MED ORDER — SODIUM CHLORIDE 0.9 % IR SOLN
Status: DC | PRN
  Administered 2012-09-19: 02:00:00

## 2012-09-19 MED ORDER — SODIUM CHLORIDE 0.9 % IV SOLN
INTRAVENOUS | Status: DC | PRN
  Administered 2012-09-19: 02:00:00 via INTRAVENOUS

## 2012-09-19 MED ORDER — MICROFIBRILLAR COLL HEMOSTAT EX PADS
MEDICATED_PAD | CUTANEOUS | Status: DC | PRN
  Administered 2012-09-19: 1 via TOPICAL

## 2012-09-19 MED ORDER — SUCCINYLCHOLINE CHLORIDE 20 MG/ML IJ SOLN
INTRAMUSCULAR | Status: DC | PRN
  Administered 2012-09-19: 100 mg via INTRAVENOUS

## 2012-09-19 MED ORDER — 0.9 % SODIUM CHLORIDE (POUR BTL) OPTIME
TOPICAL | Status: DC | PRN
  Administered 2012-09-19 (×5): 1000 mL

## 2012-09-19 MED ORDER — METFORMIN HCL 500 MG PO TABS
500.0000 mg | ORAL_TABLET | Freq: Two times a day (BID) | ORAL | Status: DC
  Administered 2012-09-19: 500 mg via ORAL
  Filled 2012-09-19 (×5): qty 1

## 2012-09-19 MED ORDER — SIMVASTATIN 10 MG PO TABS
10.0000 mg | ORAL_TABLET | Freq: Every day | ORAL | Status: DC
  Filled 2012-09-19 (×2): qty 1

## 2012-09-19 MED ORDER — INSULIN ASPART 100 UNIT/ML ~~LOC~~ SOLN
0.0000 [IU] | SUBCUTANEOUS | Status: DC
  Administered 2012-09-19 – 2012-09-21 (×9): 2 [IU] via SUBCUTANEOUS
  Administered 2012-09-21: 3 [IU] via SUBCUTANEOUS
  Administered 2012-09-21 (×2): 2 [IU] via SUBCUTANEOUS
  Administered 2012-09-21: 3 [IU] via SUBCUTANEOUS
  Administered 2012-09-21: 2 [IU] via SUBCUTANEOUS
  Administered 2012-09-22 (×3): 3 [IU] via SUBCUTANEOUS
  Administered 2012-09-22: 8 [IU] via SUBCUTANEOUS
  Administered 2012-09-22 – 2012-09-23 (×3): 2 [IU] via SUBCUTANEOUS
  Administered 2012-09-23 (×3): 5 [IU] via SUBCUTANEOUS
  Administered 2012-09-24: 2 [IU] via SUBCUTANEOUS
  Administered 2012-09-24: 3 [IU] via SUBCUTANEOUS
  Administered 2012-09-25 – 2012-09-26 (×2): 2 [IU] via SUBCUTANEOUS
  Administered 2012-09-26: 3 [IU] via SUBCUTANEOUS
  Administered 2012-09-26 – 2012-09-27 (×5): 2 [IU] via SUBCUTANEOUS

## 2012-09-19 MED ORDER — LORAZEPAM 2 MG/ML IJ SOLN
1.0000 mg | Freq: Once | INTRAMUSCULAR | Status: AC
  Administered 2012-09-19: 1 mg via INTRAVENOUS
  Filled 2012-09-19: qty 1

## 2012-09-19 MED ORDER — FENTANYL CITRATE 0.05 MG/ML IJ SOLN
25.0000 ug | INTRAMUSCULAR | Status: DC | PRN
  Filled 2012-09-19: qty 2

## 2012-09-19 MED ORDER — SODIUM CHLORIDE 0.9 % IV SOLN
500.0000 mg | Freq: Two times a day (BID) | INTRAVENOUS | Status: DC
  Administered 2012-09-19: 500 mg via INTRAVENOUS
  Filled 2012-09-19 (×2): qty 5

## 2012-09-19 MED ORDER — THROMBIN 20000 UNITS EX KIT
PACK | CUTANEOUS | Status: DC | PRN
  Administered 2012-09-19: 02:00:00 via TOPICAL

## 2012-09-19 MED ORDER — PANTOPRAZOLE SODIUM 40 MG PO TBEC: 40.0000 mg | DELAYED_RELEASE_TABLET | Freq: Every day | ORAL | Status: DC

## 2012-09-19 MED ORDER — PROPOFOL 10 MG/ML IV EMUL
5.0000 ug/kg/min | INTRAVENOUS | Status: DC
  Administered 2012-09-19: 20 ug/kg/min via INTRAVENOUS
  Filled 2012-09-19: qty 100

## 2012-09-19 MED ORDER — OXYCODONE HCL 5 MG/5ML PO SOLN: 5.0000 mg | Freq: Once | ORAL | Status: AC | PRN

## 2012-09-19 MED ORDER — ACETAMINOPHEN 650 MG RE SUPP: 650.0000 mg | RECTAL | Status: DC | PRN

## 2012-09-19 MED ORDER — PROMETHAZINE HCL 25 MG PO TABS: 12.5000 mg | ORAL_TABLET | ORAL | Status: DC | PRN

## 2012-09-19 MED ORDER — FENTANYL CITRATE 0.05 MG/ML IJ SOLN
INTRAMUSCULAR | Status: DC | PRN
  Administered 2012-09-19: 100 ug via INTRAVENOUS
  Administered 2012-09-19 (×2): 50 ug via INTRAVENOUS
  Administered 2012-09-19: 150 ug via INTRAVENOUS
  Administered 2012-09-19: 100 ug via INTRAVENOUS
  Administered 2012-09-19: 50 ug via INTRAVENOUS

## 2012-09-19 MED ORDER — PANTOPRAZOLE SODIUM 40 MG IV SOLR
40.0000 mg | Freq: Every day | INTRAVENOUS | Status: DC
  Administered 2012-09-19: 40 mg via INTRAVENOUS
  Filled 2012-09-19 (×2): qty 40

## 2012-09-19 MED ORDER — CEFAZOLIN SODIUM-DEXTROSE 2-3 GM-% IV SOLR
2.0000 g | Freq: Three times a day (TID) | INTRAVENOUS | Status: DC
  Administered 2012-09-19 – 2012-09-25 (×20): 2 g via INTRAVENOUS
  Filled 2012-09-19 (×22): qty 50

## 2012-09-19 MED ORDER — POTASSIUM CHLORIDE IN NACL 20-0.9 MEQ/L-% IV SOLN
INTRAVENOUS | Status: DC
  Administered 2012-09-19: 04:00:00 via INTRAVENOUS
  Administered 2012-09-20: 1000 mL via INTRAVENOUS
  Administered 2012-09-21 – 2012-09-22 (×2): via INTRAVENOUS
  Filled 2012-09-19 (×9): qty 1000

## 2012-09-19 MED ORDER — BACITRACIN ZINC 500 UNIT/GM EX OINT
TOPICAL_OINTMENT | CUTANEOUS | Status: DC | PRN
  Administered 2012-09-19: 1 via TOPICAL

## 2012-09-19 MED ORDER — CEFAZOLIN SODIUM 1-5 GM-% IV SOLN
INTRAVENOUS | Status: AC
  Administered 2012-09-19: 2 g via INTRAVENOUS
  Filled 2012-09-19: qty 100

## 2012-09-19 MED ORDER — ONDANSETRON HCL 4 MG PO TABS: 4.0000 mg | ORAL_TABLET | ORAL | Status: DC | PRN

## 2012-09-19 SURGICAL SUPPLY — 71 items
BAG DECANTER FOR FLEXI CONT (MISCELLANEOUS) ×2 IMPLANT
BANDAGE GAUZE 4  KLING STR (GAUZE/BANDAGES/DRESSINGS) IMPLANT
BANDAGE GAUZE ELAST BULKY 4 IN (GAUZE/BANDAGES/DRESSINGS) IMPLANT
BENZOIN TINCTURE PRP APPL 2/3 (GAUZE/BANDAGES/DRESSINGS) ×2 IMPLANT
BIT DRILL WIRE PASS 1.3MM (BIT) IMPLANT
BLADE CLIPPER SURG NEURO (BLADE) IMPLANT
BRUSH SCRUB EZ PLAIN DRY (MISCELLANEOUS) IMPLANT
BUR ACORN 6.0 PRECISION (BURR) ×2 IMPLANT
BUR ROUTER D-58 CRANI (BURR) ×2 IMPLANT
CANISTER SUCTION 2500CC (MISCELLANEOUS) ×4 IMPLANT
CLIP TI MEDIUM 6 (CLIP) IMPLANT
CLOTH BEACON ORANGE TIMEOUT ST (SAFETY) ×2 IMPLANT
CONT SPEC 4OZ CLIKSEAL STRL BL (MISCELLANEOUS) ×2 IMPLANT
CORDS BIPOLAR (ELECTRODE) ×2 IMPLANT
DRAIN JACKSON PRATT 10MM FLAT (MISCELLANEOUS) IMPLANT
DRAPE NEUROLOGICAL W/INCISE (DRAPES) ×2 IMPLANT
DRAPE SURG 17X23 STRL (DRAPES) IMPLANT
DRAPE WARM FLUID 44X44 (DRAPE) ×2 IMPLANT
DRESSING TELFA 8X3 (GAUZE/BANDAGES/DRESSINGS) ×4 IMPLANT
DRILL WIRE PASS 1.3MM (BIT)
DRSG OPSITE 4X5.5 SM (GAUZE/BANDAGES/DRESSINGS) IMPLANT
DRSG OPSITE POSTOP 4X6 (GAUZE/BANDAGES/DRESSINGS) ×4 IMPLANT
ELECT CAUTERY BLADE 6.4 (BLADE) ×2 IMPLANT
ELECT NEEDLE TIP 2.8 STRL (NEEDLE) ×2 IMPLANT
ELECT REM PT RETURN 9FT ADLT (ELECTROSURGICAL) ×2
ELECTRODE REM PT RTRN 9FT ADLT (ELECTROSURGICAL) ×1 IMPLANT
EVACUATOR SILICONE 100CC (DRAIN) IMPLANT
GAUZE SPONGE 4X4 16PLY XRAY LF (GAUZE/BANDAGES/DRESSINGS) IMPLANT
GLOVE BIOGEL M 7.0 STRL (GLOVE) ×2 IMPLANT
GLOVE ECLIPSE 7.5 STRL STRAW (GLOVE) ×2 IMPLANT
GLOVE EXAM NITRILE LRG STRL (GLOVE) IMPLANT
GLOVE EXAM NITRILE MD LF STRL (GLOVE) IMPLANT
GLOVE EXAM NITRILE XL STR (GLOVE) IMPLANT
GLOVE EXAM NITRILE XS STR PU (GLOVE) IMPLANT
GLOVE INDICATOR 7.5 STRL GRN (GLOVE) ×2 IMPLANT
GOWN BRE IMP SLV AUR LG STRL (GOWN DISPOSABLE) ×4 IMPLANT
GOWN BRE IMP SLV AUR XL STRL (GOWN DISPOSABLE) IMPLANT
GOWN STRL REIN 2XL LVL4 (GOWN DISPOSABLE) IMPLANT
HOOK DURA (MISCELLANEOUS) IMPLANT
KIT BASIN OR (CUSTOM PROCEDURE TRAY) ×2 IMPLANT
KIT ROOM TURNOVER OR (KITS) ×2 IMPLANT
NEEDLE HYPO 22GX1.5 SAFETY (NEEDLE) ×2 IMPLANT
NS IRRIG 1000ML POUR BTL (IV SOLUTION) ×10 IMPLANT
PACK CRANIOTOMY (CUSTOM PROCEDURE TRAY) ×2 IMPLANT
PAD ARMBOARD 7.5X6 YLW CONV (MISCELLANEOUS) ×6 IMPLANT
PATTIES SURGICAL .25X.25 (GAUZE/BANDAGES/DRESSINGS) IMPLANT
PATTIES SURGICAL .5 X.5 (GAUZE/BANDAGES/DRESSINGS) IMPLANT
PATTIES SURGICAL .5 X3 (DISPOSABLE) IMPLANT
PATTIES SURGICAL .75X.75 (GAUZE/BANDAGES/DRESSINGS) IMPLANT
PATTIES SURGICAL 1X1 (DISPOSABLE) IMPLANT
PIN MAYFIELD SKULL DISP (PIN) ×2 IMPLANT
PLATE 1.5  2HOLE MED NEURO (Plate) ×1 IMPLANT
PLATE 1.5 2HOLE MED NEURO (Plate) ×1 IMPLANT
PLATE 1.5 5HOLE SQUARE (Plate) ×4 IMPLANT
SCREW SELF DRILL HT 1.5/4MM (Screw) ×20 IMPLANT
SPECIMEN JAR SMALL (MISCELLANEOUS) IMPLANT
SPONGE GAUZE 4X4 12PLY (GAUZE/BANDAGES/DRESSINGS) ×2 IMPLANT
SPONGE NEURO XRAY DETECT 1X3 (DISPOSABLE) IMPLANT
STAPLER SKIN PROX WIDE 3.9 (STAPLE) ×2 IMPLANT
STAPLER VISISTAT 35W (STAPLE) ×2 IMPLANT
SUT ETHILON 3 0 FSL (SUTURE) IMPLANT
SUT NURALON 4 0 TR CR/8 (SUTURE) ×4 IMPLANT
SUT VIC AB 2-0 CP2 18 (SUTURE) ×4 IMPLANT
SYR 20ML ECCENTRIC (SYRINGE) ×2 IMPLANT
SYR CONTROL 10ML LL (SYRINGE) ×2 IMPLANT
TAPE PAPER MEDFIX 1IN X 10YD (GAUZE/BANDAGES/DRESSINGS) IMPLANT
TOWEL OR 17X24 6PK STRL BLUE (TOWEL DISPOSABLE) IMPLANT
TOWEL OR 17X26 10 PK STRL BLUE (TOWEL DISPOSABLE) ×2 IMPLANT
TRAY FOLEY CATH 14FRSI W/METER (CATHETERS) IMPLANT
UNDERPAD 30X30 INCONTINENT (UNDERPADS AND DIAPERS) ×2 IMPLANT
WATER STERILE IRR 1000ML POUR (IV SOLUTION) ×2 IMPLANT

## 2012-09-19 NOTE — Transfer of Care (Signed)
Immediate Anesthesia Transfer of Care Note  Patient: Gerald Leach  Procedure(s) Performed: Procedure(s): CRANIOTOMY HEMATOMA EVACUATION SUBDURAL (Right)  Patient Location: NICU  Anesthesia Type:General  Level of Consciousness: Patient remains intubated per anesthesia plan  Airway & Oxygen Therapy: Patient remains intubated per anesthesia plan and Patient placed on Ventilator (see vital sign flow sheet for setting)  Post-op Assessment: Report given to PACU RN and Post -op Vital signs reviewed and stable  Post vital signs: Reviewed and stable  Complications: No apparent anesthesia complications

## 2012-09-19 NOTE — Progress Notes (Signed)
Utilization review completed. Nylen Creque, RN, BSN. 

## 2012-09-19 NOTE — Progress Notes (Signed)
Pt. Seemingly more lethargic and disoriented.  Unable to tell RN where he is and has been able to all day.  Notified Dr. Jeral Fruit of this change and he ordered a CT scan.

## 2012-09-19 NOTE — Evaluation (Signed)
Clinical/Bedside Swallow Evaluation Patient Details  Name: Gerald Leach MRN: [id number] Date of Birth: 07-16-1933  Today's Date: 09/19/2012 Time: 4782-9562 SLP Time Calculation (min): 10 min  Past Medical History:  Past Medical History  Diagnosis Date  . Hypertension   . Colonic polyp 2003  . GERD (gastroesophageal reflux disease)   . ED (erectile dysfunction)   . Hyperlipidemia   . Hypothyroidism   . Brain aneurysm    Past Surgical History:  Past Surgical History  Procedure Laterality Date  . Brain aneurysm surgery      at [facility]  . Carotid artery aneurysm    . Colonoscopy      polyps  . Right hernia     HPI:  79 year old man, with past medical history of hypertension, and hypothyroidism, admited with a large right subdural hematoma. Unclear how pt sustained hematoma. Pt confused for a few days per reports. Has acute respiratory failure post subdural evacuation along with severe hypertension. Pt intubated for procedure 4/4, extubated at 12 pm.    Assessment / Plan / Recommendation Clinical Impression  Pt demosntrates evidence of an acute reversible dysphagia due to AMS and brief but recent extubation. While there are not overt signs of aspiration other than a vocalized exhalation after every sip (like a throat clear) the pt is at risk given hoarse vocal quality indicating laryngeal edema, as well as lethargy resulting in poor awareness of PO. Feel pt likely to make rapid progress with a day of recovery. Recommend pt take pills in puree if necessary and may have ice chips if he requests something to drink. Otherwise, keep pt NPO until SLP f/u tomorrow. Also request cognitive linguistic eval.     Aspiration Risk  Moderate    Diet Recommendation NPO except meds;Ice chips PRN after oral care        Other  Recommendations Oral Care Recommendations: Oral care QID   Follow Up Recommendations  Inpatient Rehab    Frequency and Duration min 2x/week  2 weeks   Pertinent  Vitals/Pain NA    SLP Swallow Goals Patient will utilize recommended strategies during swallow to increase swallowing safety with: Minimal cueing Goal #3: Pt will sustain attention to trials of thin liquids and solids with timley oral transit and swallow response with min verbal cues.    Swallow Study Prior Functional Status       General HPI: 79 year old man, with past medical history of hypertension, and hypothyroidism, admited with a large right subdural hematoma. Unclear how pt sustained hematoma. Pt confused for a few days per reports. Has acute respiratory failure post subdural evacuation along with severe hypertension. Pt intubated for procedure 4/4, extubated at 12 pm.  Type of Study: Bedside swallow evaluation Previous Swallow Assessment: none Diet Prior to this Study: NPO Temperature Spikes Noted: No Respiratory Status: Room air History of Recent Intubation: Yes Length of Intubations (days): 1 days Date extubated: 09/19/12 Behavior/Cognition: Alert;Lethargic;Distractible Oral Cavity - Dentition: Adequate natural dentition Self-Feeding Abilities: Needs assist Patient Positioning: Upright in bed Baseline Vocal Quality: Hoarse;Breathy Volitional Cough: Weak Volitional Swallow: Able to elicit    Oral/Motor/Sensory Function Overall Oral Motor/Sensory Function: Appears within functional limits for tasks assessed   Ice Chips Ice chips: Impaired Presentation: Spoon Oral Phase Impairments: Poor awareness of bolus;Impaired anterior to posterior transit Oral Phase Functional Implications: Oral holding;Prolonged oral transit   Thin Liquid Thin Liquid: Impaired Presentation: Cup;Spoon;Straw Oral Phase Impairments: Poor awareness of bolus;Impaired anterior to posterior transit Oral Phase Functional Implications: Oral holding;Prolonged  oral transit Pharyngeal  Phase Impairments: Throat Clearing - Immediate    Nectar Thick Nectar Thick Liquid: Not tested   Honey Thick Honey Thick  Liquid: Not tested   Puree Puree: Impaired Presentation: Spoon Oral Phase Impairments: Poor awareness of bolus;Impaired anterior to posterior transit Oral Phase Functional Implications: Oral holding;Prolonged oral transit   Solid   GO    Solid: Not tested      Gerald Ditty, MA CCC-SLP [phone]  Gerald Leach 09/19/2012,2:54 PM

## 2012-09-19 NOTE — Procedures (Signed)
Extubation Procedure Note  Patient Details:   Name: Gerald Leach DOB: 07-05-1933 MRN: [id number]   Airway Documentation:     Evaluation  O2 sats: stable throughout Complications: No apparent complications Patient did tolerate procedure well. Bilateral Breath Sounds: Rhonchi Suctioning: Airway Yes  Patient extubated and placed on 4LNC. Patient has bilateral breathsounds, no stridor present. HR-65 99% 139/76. RT will continue to monitor patient  Adolm Orla 09/19/2012, 11:57 AM

## 2012-09-19 NOTE — Consult Note (Signed)
PULMONARY  / CRITICAL CARE MEDICINE  Name: LAZER WOLLARD MRN: [id number] DOB: Sep 08, 1933    ADMISSION DATE:  09/18/2012 CONSULTATION DATE: 09/19/2012   REFERRING MD :  Dr Phoebe Perch PRIMARY SERVICE: Neurosurgery   CHIEF COMPLAINT:  Acute encephalopathy  BRIEF PATIENT DESCRIPTION: 79 year old man, with past medical history of hypertension, and hypothyroidism, admitted with a large right subdural hematoma. Has acute respiratory failure post subdural evacuation along with severe hypertension.  SIGNIFICANT EVENTS / STUDIES:  Head CT scan-right large subdural hematoma with 1.44 cm midline shift. 4/4 > Craniotomy/hematoma evacuation  LINES / TUBES: 09/19/2012 - ETT.   CULTURES: None  ANTIBIOTICS: IV Ancef periop   HISTORY OF PRESENT ILLNESS:  79 year old man, with past medical history of hypertension, and hypothyroidism, admitted with a large right subdural hematoma. Has acute respiratory failure post subdural evacuation along with severe hypertension. Family reported the patient was having altered mental status for a few days. Presented at [facility] with altered mental status, and a head CT scan, revealed a right subdural hematoma. He was admitted to [facility] for evacuation. He was also having vomiting.  PAST MEDICAL HISTORY :  Past Medical History  Diagnosis Date  . Hypertension   . Colonic polyp 2003  . GERD (gastroesophageal reflux disease)   . ED (erectile dysfunction)   . Hyperlipidemia   . Hypothyroidism   . Brain aneurysm    Past Surgical History  Procedure Laterality Date  . Brain aneurysm surgery      at [facility]  . Carotid artery aneurysm    . Colonoscopy      polyps  . Right hernia     Prior to Admission medications   Medication Sig Start Date End Date Taking? Authorizing Provider  aspirin EC 81 MG tablet Take 81 mg by mouth at bedtime.   Yes Historical Provider, MD  atenolol (TENORMIN) 12.5 mg TABS Take 0.5 tablets (12.5 mg total) by mouth daily.  06/04/12  Yes Roderick Pee, MD  HYDROcodone-acetaminophen (NORCO) 7.5-325 MG per tablet Take 1 tablet by mouth every 6 (six) hours as needed for pain.    Yes Historical Provider, MD  levothyroxine (SYNTHROID, LEVOTHROID) 50 MCG tablet Take 50 mcg by mouth daily before breakfast.   Yes Historical Provider, MD  metFORMIN (GLUCOPHAGE) 500 MG tablet Take 500 mg by mouth 2 (two) times daily with a meal.   Yes Historical Provider, MD  omeprazole (PRILOSEC) 20 MG capsule Take 20 mg by mouth every Monday, Wednesday, and Friday.    Yes Historical Provider, MD  simvastatin (ZOCOR) 10 MG tablet Take 10 mg by mouth at bedtime.   Yes Historical Provider, MD  traMADol (ULTRAM) 50 MG tablet Take 50 mg by mouth every 8 (eight) hours as needed for pain.   Yes Historical Provider, MD  glucose blood (FREESTYLE LITE) test strip 1 each by Other route daily. Use as instructed 02/13/12   Roderick Pee, MD  ONE TOUCH LANCETS MISC 1 each by Does not apply route daily. 06/27/11   Roderick Pee, MD   No Known Allergies  FAMILY HISTORY:  History reviewed. No pertinent family history. SOCIAL HISTORY:  reports that he has never smoked. He has never used smokeless tobacco. He reports that  drinks alcohol. He reports that he does not use illicit drugs.  REVIEW OF SYSTEMS:  Unable to obtain review of systems. Patient is intubated.  SUBJECTIVE:    VITAL SIGNS: Temp:  [96.3 F (35.7 C)-99.5 F (37.5 C)] 96.3  F (35.7 C) (04/04 0335) Pulse Rate:  [51-65] 51 (04/04 0335) Resp:  [16-28] 16 (04/04 0335) BP: (142-205)/(67-106) 205/89 mmHg (04/04 0335) SpO2:  [94 %-100 %] 100 % (04/04 0335) Arterial Line BP: (218)/(99) 218/99 mmHg (04/04 0335) FiO2 (%):  [40 %] 40 % (04/04 0309) Weight:  [180 lb 5.4 oz (81.8 kg)] 180 lb 5.4 oz (81.8 kg) (04/04 0000) HEMODYNAMICS:   VENTILATOR SETTINGS: Vent Mode:  [-] PRVC FiO2 (%):  [40 %] 40 % Set Rate:  [16 bmp] 16 bmp Vt Set:  [500 mL] 500 mL PEEP:  [5 cmH20] 5 cmH20 Plateau  Pressure:  [14 cmH20] 14 cmH20 INTAKE / OUTPUT: Intake/Output     04/03 0701 - 04/04 0700   I.V. (mL/kg) 1900 (23.2)   Total Intake(mL/kg) 1900 (23.2)   Urine (mL/kg/hr) 900   Blood 100   Total Output 1000   Net +900         PHYSICAL EXAMINATION: General:  Intubated. Appears comfortable. Neuro: Sedated HEENT: Right craniotomy without active bleeding. Drain with 25 cc of fresh blood. Cardiovascular:  Heart sounds heard, regular and normal. No added sounds. No murmurs. Lungs:  Clear to auscultation bilaterally. Abdomen: Nondistended, nontender, normal bowel sounds. No palpable masses. Musculoskeletal: Unremarkable  Skin: No rashes.  LABS:  Recent Labs Lab 09/18/12 2100 09/18/12 2116 09/18/12 2320  HGB 15.1 16.3  --   WBC 11.5*  --   --   PLT 262  --   --   NA  --  133*  --   K  --  4.4  --   CL  --  95*  --   GLUCOSE  --  136*  --   BUN  --  11  --   CREATININE  --  0.90  --   APTT  --   --  41*  INR  --   --  0.99   No results found for this basename: GLUCAP,  in the last 168 hours  Pre-intubation CXR: No acute cardiopulmonary abnormality.   ASSESSMENT / PLAN:  NEUROLOGIC A:   SDH, s/p craniotomy and hematoma evacuation  Sedate  P:   Per NSGY RASS -2 Continue with Keppra infusion as per neurosurgery   PULMONARY A:  Post surgery respiratory failure.  P:   Postintubation chest x-ray ABGs SBT in AM  Start sedation with propofol infusion   CARDIOVASCULAR A: Elevated blood pressure. BP 201/89 P:  IV labetalol 10 - 20 mg every 10 minutes prn as per  Goal SBP < 160 per neurosurgery  RENAL: No acute issues. Forming urine. Foley in place. 300 cc clear urine P:   No intervention   GASTROINTESTINAL A:  No acute events  P:   IV Protonix for GI ppx   HEMATOLOGIC A: No acute issues. No active bleeding. Drain with 25 cc of blood P:  CBC in AM   INFECTIOUS A: No active issues, mild leukocytosis P:   No intervention  CBC in AM  ENDOCRINE A:   Mild hyperglycemia, hypothyroidism   P:   SSI  Continue with synthroid    Dow Adolph PGY-1 Internal Medicine  Pager [phone] 09/19/2012, 3:42 AM   TODAY'S SUMMARY: 79 year old man, with past medical history of hypertension, and hypothyroidism, admitted with a large right subdural hematoma. Has acute respiratory failure post subdural evacuation along with severe hypertension. Will manage hypertension for now.  I have personally obtained a history, examined the patient, evaluated laboratory and imaging results, formulated the  assessment and plan and placed orders. CRITICAL CARE: The patient is critically ill with multiple organ systems failure and requires high complexity decision making for assessment and support, frequent evaluation and titration of therapies, application of advanced monitoring technologies and extensive interpretation of multiple databases. Critical Care Time devoted to patient care services described in this note is 30 minutes.

## 2012-09-19 NOTE — Anesthesia Preprocedure Evaluation (Signed)
Anesthesia Evaluation  Patient identified by MRN, date of birth, ID band Patient confused    Reviewed: Allergy & Precautions, H&P , NPO status , Patient's Chart, lab work & pertinent test results  Airway Mallampati: II  Neck ROM: full    Dental   Pulmonary          Cardiovascular hypertension, + Peripheral Vascular Disease     Neuro/Psych +SDH    GI/Hepatic GERD-  ,  Endo/Other  diabetes, Type 2Hypothyroidism   Renal/GU      Musculoskeletal   Abdominal   Peds  Hematology   Anesthesia Other Findings   Reproductive/Obstetrics                           Anesthesia Physical Anesthesia Plan  ASA: III and emergent  Anesthesia Plan: General   Post-op Pain Management:    Induction: Intravenous  Airway Management Planned: Oral ETT  Additional Equipment:   Intra-op Plan:   Post-operative Plan: Extubation in OR  Informed Consent: I have reviewed the patients History and Physical, chart, labs and discussed the procedure including the risks, benefits and alternatives for the proposed anesthesia with the patient or authorized representative who has indicated his/her understanding and acceptance.     Plan Discussed with: CRNA and Surgeon  Anesthesia Plan Comments:         Anesthesia Quick Evaluation

## 2012-09-19 NOTE — Anesthesia Postprocedure Evaluation (Deleted)
Anesthesia Post Note  Patient: Gerald Leach  Procedure(s) Performed: Procedure(s) (LRB): CRANIOTOMY HEMATOMA EVACUATION SUBDURAL (Right)  Anesthesia type: General  Patient location: PACU  Post pain: Pain level controlled and Adequate analgesia  Post assessment: Post-op Vital signs reviewed, Patient's Cardiovascular Status Stable, Respiratory Function Stable, Patent Airway and Pain level controlled  Last Vitals:  Filed Vitals:   09/19/12 0335  BP: 205/89  Pulse: 51  Temp: 35.7 C  Resp: 16    Post vital signs: Reviewed and stable  Level of consciousness: awake, alert  and oriented  Complications: No apparent anesthesia complications

## 2012-09-19 NOTE — Consult Note (Signed)
PULMONARY  / CRITICAL CARE MEDICINE  Name: Gerald Leach MRN: [id number] DOB: May 02, 1934    ADMISSION DATE:  09/18/2012 CONSULTATION DATE: 09/19/2012   REFERRING MD :  Dr Phoebe Perch PRIMARY SERVICE: Neurosurgery   CHIEF COMPLAINT:  Acute encephalopathy  BRIEF PATIENT DESCRIPTION: 79 year old man, with past medical history of hypertension, and hypothyroidism, admitted with a large right subdural hematoma. Has acute respiratory failure post subdural evacuation along with severe hypertension.  SIGNIFICANT EVENTS / STUDIES:  Head CT scan-right large subdural hematoma with 1.44 cm midline shift. 4/4 > Craniotomy/hematoma evacuation  LINES / TUBES: 09/19/2012 - ETT.   CULTURES: None  ANTIBIOTICS: IV Ancef periop   HISTORY OF PRESENT ILLNESS:  79 year old man, with past medical history of hypertension, and hypothyroidism, admitted with a large right subdural hematoma. Has acute respiratory failure post subdural evacuation along with severe hypertension. Family reported the patient was having altered mental status for a few days. Presented at [facility] with altered mental status, and a head CT scan, revealed a right subdural hematoma. He was admitted to [facility] for evacuation. He was also having vomiting.  PAST MEDICAL HISTORY :  Past Medical History  Diagnosis Date  . Hypertension   . Colonic polyp 2003  . GERD (gastroesophageal reflux disease)   . ED (erectile dysfunction)   . Hyperlipidemia   . Hypothyroidism   . Brain aneurysm    Past Surgical History  Procedure Laterality Date  . Brain aneurysm surgery      at [facility]  . Carotid artery aneurysm    . Colonoscopy      polyps  . Right hernia     Prior to Admission medications   Medication Sig Start Date End Date Taking? Authorizing Provider  aspirin EC 81 MG tablet Take 81 mg by mouth at bedtime.   Yes Historical Provider, MD  atenolol (TENORMIN) 12.5 mg TABS Take 0.5 tablets (12.5 mg total) by mouth daily.  06/04/12  Yes Roderick Pee, MD  HYDROcodone-acetaminophen (NORCO) 7.5-325 MG per tablet Take 1 tablet by mouth every 6 (six) hours as needed for pain.    Yes Historical Provider, MD  levothyroxine (SYNTHROID, LEVOTHROID) 50 MCG tablet Take 50 mcg by mouth daily before breakfast.   Yes Historical Provider, MD  metFORMIN (GLUCOPHAGE) 500 MG tablet Take 500 mg by mouth 2 (two) times daily with a meal.   Yes Historical Provider, MD  omeprazole (PRILOSEC) 20 MG capsule Take 20 mg by mouth every Monday, Wednesday, and Friday.    Yes Historical Provider, MD  simvastatin (ZOCOR) 10 MG tablet Take 10 mg by mouth at bedtime.   Yes Historical Provider, MD  traMADol (ULTRAM) 50 MG tablet Take 50 mg by mouth every 8 (eight) hours as needed for pain.   Yes Historical Provider, MD  glucose blood (FREESTYLE LITE) test strip 1 each by Other route daily. Use as instructed 02/13/12   Roderick Pee, MD  ONE TOUCH LANCETS MISC 1 each by Does not apply route daily. 06/27/11   Roderick Pee, MD   No Known Allergies  FAMILY HISTORY:  History reviewed. No pertinent family history. SOCIAL HISTORY:  reports that he has never smoked. He has never used smokeless tobacco. He reports that  drinks alcohol. He reports that he does not use illicit drugs.  REVIEW OF SYSTEMS:  Unable to obtain review of systems. Patient is intubated.  SUBJECTIVE:    VITAL SIGNS: Temp:  [95.9 F (35.5 C)-99.5 F (37.5 C)] 98.8  F (37.1 C) (04/04 1000) Pulse Rate:  [51-73] 61 (04/04 1000) Resp:  [16-28] 17 (04/04 1000) BP: (105-216)/(63-106) 128/74 mmHg (04/04 1000) SpO2:  [94 %-100 %] 100 % (04/04 1000) Arterial Line BP: (121-240)/(64-113) 150/72 mmHg (04/04 0800) FiO2 (%):  [40 %] 40 % (04/04 0826) Weight:  [81.8 kg (180 lb 5.4 oz)] 81.8 kg (180 lb 5.4 oz) (04/04 0000) HEMODYNAMICS:   VENTILATOR SETTINGS: Vent Mode:  [-] CPAP;PSV FiO2 (%):  [40 %] 40 % Set Rate:  [16 bmp] 16 bmp Vt Set:  [500 mL] 500 mL PEEP:  [5 cmH20] 5  cmH20 Pressure Support:  [5 cmH20] 5 cmH20 Plateau Pressure:  [14 cmH20] 14 cmH20 INTAKE / OUTPUT: Intake/Output     04/03 0701 - 04/04 0700 04/04 0701 - 04/05 0700   I.V. (mL/kg) 1900 (23.2)    IV Piggyback  50   Total Intake(mL/kg) 1900 (23.2) 50 (0.6)   Urine (mL/kg/hr) 2000 130 (0.3)   Drains 80    Blood 100    Total Output 2180 130   Net -280 -80          PHYSICAL EXAMINATION: General:  Intubated. Appears comfortable. Neuro: Sedated HEENT: Right craniotomy without active bleeding. Drain with 25 cc of fresh blood. Cardiovascular:  Heart sounds heard, regular and normal. No added sounds. No murmurs. Lungs:  Clear to auscultation bilaterally. Abdomen: Nondistended, nontender, normal bowel sounds. No palpable masses. Musculoskeletal: Unremarkable  Skin: No rashes.  LABS:  Recent Labs Lab 09/18/12 2100 09/18/12 2116 09/18/12 2320 09/19/12 0400 09/19/12 0420  HGB 15.1 16.3  --   --  14.4  WBC 11.5*  --   --   --  12.8*  PLT 262  --   --   --  274  NA  --  133*  --   --  131*  K  --  4.4  --   --  3.7  CL  --  95*  --   --  98  CO2  --   --   --   --  22  GLUCOSE  --  136*  --   --  182*  BUN  --  11  --   --  10  CREATININE  --  0.90  --   --  0.53  CALCIUM  --   --   --   --  8.6  APTT  --   --  41*  --   --   INR  --   --  0.99  --   --   PHART  --   --   --  7.433  --   PCO2ART  --   --   --  32.8*  --   PO2ART  --   --   --  117.0*  --     Recent Labs Lab 09/19/12 0334 09/19/12 0804  GLUCAP 135* 118*    Pre-intubation CXR: No acute cardiopulmonary abnormality.   ASSESSMENT / PLAN:  NEUROLOGIC A:   SDH, s/p craniotomy and hematoma evacuation  Sedate  P:   - Per NSGY. - RASS -2. - Continue with Keppra infusion as per neurosurgery .  PULMONARY A:  Post surgery respiratory failure.  P:   - SBT to extubate. - ABGs adn CXR as indicated. - D/C sedation.  CARDIOVASCULAR A: Elevated blood pressure. BP 201/89 P:  - IV Hydralazine given  bradycardia.  - Goal SBP < 160 per neurosurgery  RENAL: No acute issues.  Forming urine. Foley in place. 300 cc clear urine P:   - No intervention.  GASTROINTESTINAL A:  No acute events  P:   - IV Protonix for GI ppx. - Swallow evaluation.  HEMATOLOGIC A: No acute issues. No active bleeding. Drain with 25 cc of blood P:  - CBC in AM  INFECTIOUS A: No active issues, mild leukocytosis P:   - No intervention. - CBC in AM.  ENDOCRINE A:  Mild hyperglycemia, hypothyroidism   P:   - SSI. - Continue with synthroid.  TODAY'S SUMMARY: 79 year old man, with past medical history of hypertension, and hypothyroidism, admitted with a large right subdural hematoma. Has acute respiratory failure post subdural evacuation along with severe hypertension.  Will extubate and start IV hydralazine until able to perform a swallow evaluation..  I have personally obtained a history, examined the patient, evaluated laboratory and imaging results, formulated the assessment and plan and placed orders.  CRITICAL CARE: The patient is critically ill with multiple organ systems failure and requires high complexity decision making for assessment and support, frequent evaluation and titration of therapies, application of advanced monitoring technologies and extensive interpretation of multiple databases. Critical Care Time devoted to patient care services described in this note is 35 minutes.   Alyson Reedy, M.D. [facility] Pulmonary/Critical Care Medicine. Pager: [phone]. After hours pager: [phone].

## 2012-09-19 NOTE — Op Note (Signed)
09/18/2012 - 09/19/2012  3:02 AM  PATIENT:  Gerald Leach  79 y.o. male  PRE-OPERATIVE DIAGNOSIS:  subdural hematoma  POST-OPERATIVE DIAGNOSIS:  subdural hematoma  PROCEDURE:  Procedure(s): Right CRANIOTOMY,  HEMATOMA EVACUATION -SUBDURAL  SURGEON:  Surgeon(s): Clydene Fake, MD   ANESTHESIA:   general  EBL:  Total I/O In: 1900 [I.V.:1900] Out: 1000 [Urine:900; Blood:100]  BLOOD ADMINISTERED:none  DRAINS: (.) Jackson-Pratt drain(s) with closed bulb suction in the .Marland Kitchen   SPECIMEN:  No Specimen  DICTATION: Patient with mental status changes for 24 hours and brought to the emergency room where CT scan was done showing large right subdural hematoma patient transferred home hospital and taken the operating room urgently.  Patient brought into operating room and general anesthesia was induced. Patient was placed in [place] head pins in position and prepped draped sterile fashion. Site of incision w into the field through separate stab incision and then placed in the epidural space posteriorly and subdural space anteriorly. as injected with 20 cc 1% lidocaine with epinephrine .  Incision was then made in a linear fashion over the right side of the scalp and needle-tip Bovie was used to extend down to the galea and cervical retractors were used to expose the skull. Bur was drilled posteriorly and a craniotome used to elevate a bone flap. Dura was opened U shaped fashion pedicle being medially and the dark blood some subdural space was found this was then irrigated out. We irrigated to the irrigation came back clear then the brain was pulsatile but still sunken.Gearlean Alf was closed loosely with 4-0 Nurolon sutures a JP drain was brought out through separate stab wound incision.  Bone flap was put back into position and held with microplates and screws. Donnetta Hutching was then closed through Vicryl interrupted sutures skin closed staples Vicryl was used to drain placed and dressing was placed and the drain  was hooked up to bulb suction. Patient kept intubated and transferred to intensive care unit  PLAN OF CARE: Admit to inpatient   PATIENT DISPOSITION:  ICU - intubated and critically ill.

## 2012-09-19 NOTE — H&P (Signed)
Subjective: 79 yo M with altered MS last 24 hours or so, taken tio [facility] - large right SDH seen on CT  - pt [facility]  Patient Active Problem List   Diagnosis Date Noted  . Facial laceration 02/25/2012  . Contusion, chest wall 02/25/2012  . Diabetes mellitus type II, uncontrolled 06/27/2011  . SOLAR KERATOSIS 02/17/2009  . CARCINOMA, SKIN, SQUAMOUS CELL, FACE 04/19/2008  . CARCINOMA, SKIN, SQUAMOUS CELL, FACE 04/19/2008  . HYPOTHYROIDISM 01/27/2008  . HYPERLIPIDEMIA 01/27/2008  . COLONIC POLYPS 11/15/2006  . HYPERTENSION 11/15/2006  . GERD 11/15/2006   Past Medical History  Diagnosis Date  . Hypertension   . Colonic polyp 2003  . GERD (gastroesophageal reflux disease)   . ED (erectile dysfunction)   . Hyperlipidemia   . Hypothyroidism     Past Surgical History  Procedure Laterality Date  . Brain aneurysm surgery      at [facility]  . Carotid artery aneurysm    . Colonoscopy      polyps  . Right hernia      Prescriptions prior to admission  Medication Sig Dispense Refill  . aspirin EC 81 MG tablet Take 81 mg by mouth at bedtime.      Marland Kitchen atenolol (TENORMIN) 12.5 mg TABS Take 0.5 tablets (12.5 mg total) by mouth daily.  100 tablet  3  . HYDROcodone-acetaminophen (NORCO) 7.5-325 MG per tablet Take 1 tablet by mouth every 6 (six) hours as needed for pain.       Marland Kitchen levothyroxine (SYNTHROID, LEVOTHROID) 50 MCG tablet Take 50 mcg by mouth daily before breakfast.      . metFORMIN (GLUCOPHAGE) 500 MG tablet Take 500 mg by mouth 2 (two) times daily with a meal.      . omeprazole (PRILOSEC) 20 MG capsule Take 20 mg by mouth every Monday, Wednesday, and Friday.       . simvastatin (ZOCOR) 10 MG tablet Take 10 mg by mouth at bedtime.      . traMADol (ULTRAM) 50 MG tablet Take 50 mg by mouth every 8 (eight) hours as needed for pain.      Marland Kitchen glucose blood (FREESTYLE LITE) test strip 1 each by Other route daily. Use as instructed  100 each  3  . ONE TOUCH LANCETS MISC 1 each  by Does not apply route daily.  200 each  0   No Known Allergies  History  Substance Use Topics  . Smoking status: Never Smoker   . Smokeless tobacco: Never Used  . Alcohol Use: Yes     Comment: rum mixed in diet coke 3 a week    History reviewed. No pertinent family history.   Review of Systems Altered MS  - pt unable to answer  Objective: Vital signs in last 24 hours: Temp:  [97.2 F (36.2 C)-99.5 F (37.5 C)] 99.5 F (37.5 C) (04/04 0000) Pulse Rate:  [55-65] 56 (04/04 0000) Resp:  [18-28] 22 (04/04 0000) BP: (153-189)/(83-94) 165/83 mmHg (04/04 0000) SpO2:  [94 %-100 %] 98 % (04/04 0000) Weight:  [81.8 kg (180 lb 5.4 oz)] 81.8 kg (180 lb 5.4 oz) (04/04 0000)  awake - moves all 4, follows simple commands at times - says few words  -  Ox1 ,  CN II-XII intact  Data Review  Results for orders placed during the hospital encounter of 09/18/12 (from the past 24 hour(s))  CBC WITH DIFFERENTIAL     Status: Abnormal   Collection Time  09/18/12  9:00 PM      Result Value Range   WBC 11.5 (*) 4.0 - 10.5 K/uL   RBC 5.45  4.22 - 5.81 MIL/uL   Hemoglobin 15.1  13.0 - 17.0 g/dL   HCT 40.9  81.1 - 91.4 %   MCV 82.2  78.0 - 100.0 fL   MCH 27.7  26.0 - 34.0 pg   MCHC 33.7  30.0 - 36.0 g/dL   RDW 78.2  95.6 - 21.3 %   Platelets 262  150 - 400 K/uL   Neutrophils Relative 74  43 - 77 %   Neutro Abs 8.6 (*) 1.7 - 7.7 K/uL   Lymphocytes Relative 17  12 - 46 %   Lymphs Abs 1.9  0.7 - 4.0 K/uL   Monocytes Relative 9  3 - 12 %   Monocytes Absolute 1.0  0.1 - 1.0 K/uL   Eosinophils Relative 0  0 - 5 %   Eosinophils Absolute 0.0  0.0 - 0.7 K/uL   Basophils Relative 0  0 - 1 %   Basophils Absolute 0.0  0.0 - 0.1 K/uL  POCT I-STAT, CHEM 8     Status: Abnormal   Collection Time    09/18/12  9:16 PM      Result Value Range   Sodium 133 (*) 135 - 145 mEq/L   Potassium 4.4  3.5 - 5.1 mEq/L   Chloride 95 (*) 96 - 112 mEq/L   BUN 11  6 - 23 mg/dL   Creatinine, Ser 0.86  0.50 - 1.35  mg/dL   Glucose, Bld 578 (*) 70 - 99 mg/dL   Calcium, Ion 4.69  6.29 - 1.30 mmol/L   TCO2 30  0 - 100 mmol/L   Hemoglobin 16.3  13.0 - 17.0 g/dL   HCT 52.8  41.3 - 24.4 %  URINALYSIS, ROUTINE W REFLEX MICROSCOPIC     Status: Abnormal   Collection Time    09/18/12  9:48 PM      Result Value Range   Color, Urine YELLOW  YELLOW   APPearance HAZY (*) CLEAR   Specific Gravity, Urine 1.020  1.005 - 1.030   pH 7.0  5.0 - 8.0   Glucose, UA NEGATIVE  NEGATIVE mg/dL   Hgb urine dipstick NEGATIVE  NEGATIVE   Bilirubin Urine NEGATIVE  NEGATIVE   Ketones, ur NEGATIVE  NEGATIVE mg/dL   Protein, ur NEGATIVE  NEGATIVE mg/dL   Urobilinogen, UA 1.0  0.0 - 1.0 mg/dL   Nitrite NEGATIVE  NEGATIVE   Leukocytes, UA NEGATIVE  NEGATIVE  POCT I-STAT TROPONIN I     Status: None   Collection Time    09/18/12 10:53 PM      Result Value Range   Troponin i, poc 0.00  0.00 - 0.08 ng/mL   Comment 3           APTT     Status: Abnormal   Collection Time    09/18/12 11:20 PM      Result Value Range   aPTT 41 (*) 24 - 37 seconds  PROTIME-INR     Status: None   Collection Time    09/18/12 11:20 PM      Result Value Range   Prothrombin Time 13.0  11.6 - 15.2 seconds   INR 0.99  0.00 - 1.49   *RADIOLOGY REPORT*  Clinical Data: Increasing confusion and more frequent urination.  Vomiting.  CT HEAD WITHOUT CONTRAST  Technique: Contiguous axial images were obtained  from the base of  the skull through the vertex without contrast.  Comparison: None.  Findings: Streak artifact from dental hardware limits visualization  of the posterior fossa and skull base. There is a right frontal  temporoparietal subdural hematoma measuring about 11 mm maximal  depth. There is next density within the hematoma with some  increased attenuation and some isoattenuating foci suggesting acute  to subacute hemorrhage. There is significant mass effect with  effacement of sulci and lateral ventricles and right to left  midline shift  proximally 14 mm. There is subfalcine herniation.  Underlying changes of chronic atrophy and small vessel ischemia.  There is a mass in the right suprasellar region with peripheral  calcification measuring about 1.7 x 1.9 cm. No dislocation, this  could represent calcified parenchymal mass or perhaps a large  aneurysm. Correlation with any old outside studies would be useful  in further evaluation. Otherwise MRI is suggested for further  evaluation. No depressed skull fractures.  IMPRESSION:  Large right subdural hematoma with acute and subacute features.  Significant mass effect with 14 mm right to left midline shift and  anterior subfalcine herniation. Partially calcified mass or  aneurysm in the right suprasellar region measuring up to 1.9 cm  diameter. Consider MRI for further evaluation if this has not been  previously demonstrated on old outside studies.   Assessment/Plan: Pt with large Right SDH with shift  - admit to ICU  - To OR Discussed with family   Clydene Fake, MD 09/19/2012 12:35 AM

## 2012-09-19 NOTE — Progress Notes (Signed)
Subjective: Patient sedated, intubated  Objective: Vital signs in last 24 hours: Temp:  [95.9 F (35.5 C)-99.5 F (37.5 C)] 98.1 F (36.7 C) (04/04 0700) Pulse Rate:  [51-73] 64 (04/04 0826) Resp:  [16-28] 22 (04/04 0826) BP: (105-216)/(63-106) 184/85 mmHg (04/04 0826) SpO2:  [94 %-100 %] 100 % (04/04 0700) Arterial Line BP: (121-240)/(64-113) 125/90 mmHg (04/04 0700) FiO2 (%):  [40 %] 40 % (04/04 0826) Weight:  [81.8 kg (180 lb 5.4 oz)] 81.8 kg (180 lb 5.4 oz) (04/04 0000)  Intake/Output from previous day: 04/03 0701 - 04/04 0700 In: 1900 [I.V.:1900] Out: 2180 [Urine:2000; Drains:80; Blood:100] Intake/Output this shift:   Just sedated because fighting vent - prior  , was FC all 4, Wound: mild drainage  Lab Results:  Recent Labs  09/18/12 2100 09/18/12 2116 09/19/12 0420  WBC 11.5*  --  12.8*  HGB 15.1 16.3 14.4  HCT 44.8 48.0 41.2  PLT 262  --  274   BMET  Recent Labs  09/18/12 2116 09/19/12 0420  NA 133* 131*  K 4.4 3.7  CL 95* 98  CO2  --  22  GLUCOSE 136* 182*  BUN 11 10  CREATININE 0.90 0.53  CALCIUM  --  8.6    Studies/Results: Dg Chest 2 View  09/18/2012  *RADIOLOGY REPORT*  Clinical Data: Altered mental status  CHEST - 2 VIEW  Comparison: Thoracic spine 07/22/2012  Findings: Cardiac enlargement and tortuous aorta.  Negative for heart failure.  Negative for pleural effusion.  Negative for pneumonia.  Chronic rib fractures bilaterally.  Severe chronic fracture of the thoracic mid spine, unchanged.  IMPRESSION: No acute cardiopulmonary abnormality.   Original Report Authenticated By: Janeece Riggers, M.D.    Ct Head Wo Contrast  09/18/2012  *RADIOLOGY REPORT*  Clinical Data: Increasing confusion and more frequent urination. Vomiting.  CT HEAD WITHOUT CONTRAST  Technique:  Contiguous axial images were obtained from the base of the skull through the vertex without contrast.  Comparison: None.  Findings: Streak artifact from dental hardware limits visualization  of the posterior fossa and skull base.  There is a right frontal temporoparietal subdural hematoma measuring about 11 mm maximal depth.  There is next density within the hematoma with some increased attenuation and some isoattenuating foci suggesting acute to subacute hemorrhage.  There is significant mass effect with effacement of sulci and lateral ventricles and right to left midline shift proximally 14 mm.  There is subfalcine herniation. Underlying changes of chronic atrophy and small vessel ischemia. There is a mass in the right suprasellar region with peripheral calcification measuring about 1.7 x 1.9 cm.  No dislocation, this could represent calcified parenchymal mass or perhaps a large aneurysm.  Correlation with any old outside studies would be useful in further evaluation.  Otherwise MRI is suggested for further evaluation.  No depressed skull fractures.  IMPRESSION: Large right subdural hematoma with acute and subacute features. Significant mass effect with 14 mm right to left midline shift and anterior subfalcine herniation.  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  Consider MRI for further evaluation if this has not been previously demonstrated on old outside studies.  Results were discussed by telephone with PA Roxy Horseman at 2319 hours on 09/18/2012.   Original Report Authenticated By: Burman Nieves, M.D.    Dg Chest Port 1 View  09/19/2012  *RADIOLOGY REPORT*  Clinical Data: Post intubation  PORTABLE CHEST - 1 VIEW  Comparison: Chest x-ray of  09/18/2012  Findings: The tip of the endotracheal tube is difficult to visualize, being approximately 5.6 cm above the carina.  Only mild left basilar atelectasis is present.  No infiltrate or effusion is noted.  Cardiomegaly is stable. Old right upper posterior rib fractures are noted.  IMPRESSION: Tip of endotracheal tube approximately 5.6 cm above the carina. Mild left basilar atelectasis.    Original Report Authenticated By: Dwyane Dee, M.D.     Assessment/Plan: Ok to wean  Vent per CCM   LOS: 1 day     Ellina Sivertsen R, MD 09/19/2012, 9:15 AM

## 2012-09-19 NOTE — Anesthesia Postprocedure Evaluation (Signed)
  Anesthesia Post-op Note  Patient: Gerald Leach  Procedure(s) Performed: Procedure(s): CRANIOTOMY HEMATOMA EVACUATION SUBDURAL (Right)  Patient Location: ICU  Anesthesia Type:General  Level of Consciousness: sedated  Airway and Oxygen Therapy: Patient remains intubated per anesthesia plan  Post-op Pain: none  Post-op Assessment: Post-op Vital signs reviewed, Patient's Cardiovascular Status Stable and Respiratory Function Stable  Post-op Vital Signs: Reviewed and stable  Complications: No apparent anesthesia complications

## 2012-09-20 ENCOUNTER — Inpatient Hospital Stay (HOSPITAL_COMMUNITY): Payer: Medicare Other

## 2012-09-20 DIAGNOSIS — J95821 Acute postprocedural respiratory failure: Secondary | ICD-10-CM | POA: Diagnosis not present

## 2012-09-20 DIAGNOSIS — S065XAA Traumatic subdural hemorrhage with loss of consciousness status unknown, initial encounter: Secondary | ICD-10-CM | POA: Diagnosis present

## 2012-09-20 DIAGNOSIS — S065X9A Traumatic subdural hemorrhage with loss of consciousness of unspecified duration, initial encounter: Secondary | ICD-10-CM | POA: Diagnosis present

## 2012-09-20 HISTORY — DX: Traumatic subdural hemorrhage with loss of consciousness status unknown, initial encounter: S06.5XAA

## 2012-09-20 LAB — CBC
HCT: 41.9 % (ref 39.0–52.0)
Hemoglobin: 14.4 g/dL (ref 13.0–17.0)
MCH: 27.8 pg (ref 26.0–34.0)
MCHC: 34.4 g/dL (ref 30.0–36.0)
MCV: 80.9 fL (ref 78.0–100.0)
Platelets: 218 10*3/uL (ref 150–400)
RBC: 5.18 MIL/uL (ref 4.22–5.81)
RDW: 13.3 % (ref 11.5–15.5)
WBC: 15.8 10*3/uL — ABNORMAL HIGH (ref 4.0–10.5)

## 2012-09-20 LAB — BASIC METABOLIC PANEL
BUN: 8 mg/dL (ref 6–23)
CO2: 24 mEq/L (ref 19–32)
Calcium: 9.2 mg/dL (ref 8.4–10.5)
Creatinine, Ser: 0.71 mg/dL (ref 0.50–1.35)
Glucose, Bld: 134 mg/dL — ABNORMAL HIGH (ref 70–99)

## 2012-09-20 LAB — GLUCOSE, CAPILLARY: Glucose-Capillary: 110 mg/dL — ABNORMAL HIGH (ref 70–99)

## 2012-09-20 LAB — MAGNESIUM: Magnesium: 2.2 mg/dL (ref 1.5–2.5)

## 2012-09-20 MED ORDER — LEVOTHYROXINE SODIUM 50 MCG PO TABS
50.0000 ug | ORAL_TABLET | Freq: Every day | ORAL | Status: DC
  Administered 2012-09-21 – 2012-09-30 (×8): 50 ug
  Filled 2012-09-20 (×12): qty 1

## 2012-09-20 MED ORDER — SIMVASTATIN 10 MG PO TABS
10.0000 mg | ORAL_TABLET | Freq: Every day | ORAL | Status: DC
  Administered 2012-09-20 – 2012-09-29 (×8): 10 mg
  Filled 2012-09-20 (×11): qty 1

## 2012-09-20 MED ORDER — ATENOLOL 12.5 MG HALF TABLET
12.5000 mg | ORAL_TABLET | Freq: Every day | ORAL | Status: DC
  Administered 2012-09-21 – 2012-09-30 (×7): 12.5 mg
  Filled 2012-09-20 (×11): qty 1

## 2012-09-20 MED ORDER — JEVITY 1.2 CAL PO LIQD
1000.0000 mL | ORAL | Status: DC
  Filled 2012-09-20 (×2): qty 1000

## 2012-09-20 MED ORDER — BIOTENE DRY MOUTH MT LIQD
15.0000 mL | Freq: Two times a day (BID) | OROMUCOSAL | Status: DC
  Administered 2012-09-20 – 2012-09-22 (×6): 15 mL via OROMUCOSAL

## 2012-09-20 MED ORDER — JEVITY 1.2 CAL PO LIQD
1000.0000 mL | ORAL | Status: DC
  Administered 2012-09-20 – 2012-09-21 (×2): 1000 mL
  Administered 2012-09-22: 15:00:00
  Administered 2012-09-23: 1000 mL
  Administered 2012-09-26
  Filled 2012-09-20 (×13): qty 1000

## 2012-09-20 NOTE — Progress Notes (Signed)
Speech Language Pathology Dysphagia Treatment Patient Details Name: MAVRYK PINO MRN: [id number] DOB: 1933-07-12 Today's Date: 09/20/2012 Time: 4098-1191 SLP Time Calculation (min): 15 min  Assessment / Plan / Recommendation Clinical Impression  F/u to reassess swallow for PO readiness from initial BSE completed on 09/19/12.  PO trials limited to ice chips secondary to patient's fluctuating LOA.  Patient readily accepted trial ice chips with attempts to masticate.  Max verbal and tactile cues required for patient to complete oral stage due to intermittent LOA.  Trials eventually suctioned out of left anterior sulci.  Recommend continued NPO status mainly due to cognitive status and present lethargy risk factors for aspiration.  ST to reattempt on 09/21/12 to assess swallow for PO readiness.      Diet Recommendation    NPO with temporary means   SLP Plan Continue with current plan of care      Swallowing Goals  SLP Swallowing Goals Swallow Study Goal #3 - Progress: Progressing toward goal  General Temperature Spikes Noted: No Respiratory Status: Room air Behavior/Cognition: Lethargic;Requires cueing;Doesn't follow directions;Decreased sustained attention Oral Cavity - Dentition: Adequate natural dentition Patient Positioning: Upright in bed  Oral Cavity - Oral Hygiene Does patient have any of the following "at risk" factors?: Nutritional status - inadequate Patient is HIGH RISK - Oral Care Protocol followed (see row info): Yes Patient is AT RISK - Oral Care Protocol followed (see row info): Yes Patient is mechanically ventilated, follow VAP prevention protocol for oral care: Oral care provided every 4 hours   Dysphagia Treatment Treatment focused on: Upgraded PO texture trials;Patient/family/caregiver education;Facilitation of oral preparatory phase;Facilitation of oral phase;Facilitation of pharyngeal phase Treatment Methods/Modalities: Skilled observation;Differential  diagnosis Patient observed directly with PO's: Yes Type of PO's observed: Ice chips Feeding: Total assist Oral Phase Signs & Symptoms: Left pocketing;Prolonged oral phase;Anterior loss/spillage Type of cueing: Verbal;Tactile Amount of cueing: Maximal   GO    Moreen Fowler MS, CCC-SLP [phone] [facility] 09/20/2012, 12:57 PM

## 2012-09-20 NOTE — Progress Notes (Signed)
INITIAL NUTRITION ASSESSMENT  DOCUMENTATION CODES Per approved criteria  -Not Applicable   INTERVENTION: 1. Once NG tube has been placed and confirmed, initiate Jevity 1.2 @ 20 ml/hr, advance by 10 ml q 4 hr to a goal rate of 70 ml/hr. This EN regimen will provide 2016 kcal, 93 gm protein, and 1355 ml free water. Additional free water needs being met with IVF at this time.   NUTRITION DIAGNOSIS: Swallowing difficulty related to SDH as evidenced by NPO diet with TF needs.   Goal: EN to meet >/=90% estimated nutrition needs  Monitor:  TF rate/tolerance, weight trends, labs, I/O's  Reason for Assessment: Consult   79 y.o. male  Admitting Dx: Subdural hematoma  ASSESSMENT: Pt transferred from [facility] with large right SDH. S/P craniotomy with subdural evacuation,  developed acute respiratory failure. Pt was extubated on 4/4- remains high risk for reintubation. SLP evaluated pt and NPO diet was recommended. NG tube placed and TF ordered, Jevity 1.2 @ 20 ml/hr, with goal rate of 40 ml/hr. RD consulted for TF management.   Tube has not yet been placed. No family available at time of RD visit to discuss recent weight loss. Pt is unable to provide hx at this time.   Height: Ht Readings from Last 1 Encounters:  09/19/12 6\' 1"  (1.854 m)    Weight: Wt Readings from Last 1 Encounters:  09/19/12 180 lb 5.4 oz (81.8 kg)    Ideal Body Weight: 184 lbs   % Ideal Body Weight: 98%  Wt Readings from Last 10 Encounters:  09/19/12 180 lb 5.4 oz (81.8 kg)  09/19/12 180 lb 5.4 oz (81.8 kg)  06/04/12 189 lb (85.73 kg)  05/02/12 183 lb (83.008 kg)  03/17/12 183 lb (83.008 kg)  03/03/12 185 lb (83.915 kg)  08/28/11 194 lb 4.8 oz (88.134 kg)  07/25/11 198 lb (89.812 kg)  07/11/11 200 lb (90.719 kg)  06/27/11 204 lb (92.534 kg)    Usual Body Weight: ~200 lbs   % Usual Body Weight: 90%  BMI:  Body mass index is 23.8 kg/(m^2).  Estimated Nutritional Needs: Kcal: 1900-2100 Protein: 80-90 gm   Fluid: 1.9-2.1 L   Skin: incision-head  Diet Order: NPO  EDUCATION NEEDS: -No education needs identified at this time   Intake/Output Summary (Last 24 hours) at 09/20/12 0934 Last data filed at 09/20/12 0900  Gross per 24 hour  Intake   1110 ml  Output    325 ml  Net    785 ml    Last BM: PTA    Labs:   Recent Labs Lab 09/18/12 2116 09/19/12 0420 09/20/12 0440  NA 133* 131* 134*  K 4.4 3.7 4.2  CL 95* 98 99  CO2  --  22 24  BUN 11 10 8   CREATININE 0.90 0.53 0.71  CALCIUM  --  8.6 9.2  MG  --   --  2.2  PHOS  --   --  3.0  GLUCOSE 136* 182* 134*    CBG (last 3)   Recent Labs  09/19/12 2351 09/20/12 0416 09/20/12 0741  GLUCAP 119* 128* 128*    Scheduled Meds: . antiseptic oral rinse  15 mL Mouth Rinse BID  . atenolol  12.5 mg Per Tube Daily  .  ceFAZolin (ANCEF) IV  2 g Intravenous Q8H  . insulin aspart  0-15 Units Subcutaneous Q4H  . levETIRAcetam  500 mg Intravenous BID  . [START ON 09/21/2012] levothyroxine  50 mcg Per Tube QAC breakfast  .  simvastatin  10 mg Per Tube QHS    Continuous Infusions: . 0.9 % NaCl with KCl 20 mEq / L 1,000 mL (09/20/12 0049)  . feeding supplement (JEVITY 1.2 CAL)      Past Medical History  Diagnosis Date  . Hypertension   . Colonic polyp 2003  . GERD (gastroesophageal reflux disease)   . ED (erectile dysfunction)   . Hyperlipidemia   . Hypothyroidism   . Brain aneurysm     Past Surgical History  Procedure Laterality Date  . Brain aneurysm surgery      at [facility]  . Carotid artery aneurysm    . Colonoscopy      polyps  . Right hernia      Gerald Leach RD, LDN Pager [phone] After Hours pager [phone]

## 2012-09-20 NOTE — Progress Notes (Addendum)
PULMONARY  / CRITICAL CARE MEDICINE  Name: Gerald Leach MRN: [id number] DOB: 11/27/1933    ADMISSION DATE:  09/18/2012 CONSULTATION DATE: 09/20/2012   REFERRING MD :  Dr Phoebe Perch PRIMARY SERVICE: Neurosurgery   CHIEF COMPLAINT:  Acute encephalopathy  BRIEF PATIENT DESCRIPTION: 79 year old man, with past medical history of hypertension, and hypothyroidism, admitted with a large right subdural hematoma. Has acute respiratory failure post subdural evacuation along with severe hypertension.  SIGNIFICANT EVENTS / STUDIES:  Head CT scan-right large subdural hematoma with 1.44 cm midline shift. 4/4 > Craniotomy/hematoma evacuation 4/4 extubation postop  LINES / TUBES: 09/19/2012 - ETT.>>4/4   CULTURES: None  ANTIBIOTICS: IV Ancef periop   SUBJECTIVE:   Pt extubated 4/4  Very lethargic, not able to protect airway well, high risk need for vent again, oxygenating ok  VITAL SIGNS: Temp:  [97.8 F (36.6 C)-100 F (37.8 C)] 99.7 F (37.6 C) (04/05 0745) Pulse Rate:  [58-95] 71 (04/05 0900) Resp:  [13-29] 23 (04/05 0900) BP: (116-165)/(61-95) 142/71 mmHg (04/05 0800) SpO2:  [94 %-100 %] 100 % (04/05 0900) HEMODYNAMICS: CV stable   VENTILATOR SETTINGS: Extubated to RA 4/4.  4/5 on RA sats 98-100%   INTAKE / OUTPUT: Intake/Output     04/04 0701 - 04/05 0700 04/05 0701 - 04/06 0700   I.V. (mL/kg) 900 (11) 150 (1.8)   Other 10    IV Piggyback 100    Total Intake(mL/kg) 1010 (12.3) 150 (1.8)   Urine (mL/kg/hr) 430 (0.2)    Drains 25 (0)    Blood     Total Output 455     Net +555 +150        Urine Occurrence 6 x      PHYSICAL EXAMINATION: General:  Lethargic, no distress Neuro:lethargic, will not f/c.  Non focal HEENT: Right craniotomy without active bleeding. Cardiovascular:  Heart sounds heard, regular and normal. No added sounds. No murmurs. Lungs:  Clear to auscultation bilaterally. Abdomen: Nondistended, nontender, normal bowel sounds. No palpable  masses. Musculoskeletal: Unremarkable  Skin: No rashes.  LABS:  Recent Labs Lab 09/18/12 2100  09/18/12 2116 09/18/12 2320 09/19/12 0400 09/19/12 0420 09/20/12 0440  HGB 15.1  --  16.3  --   --  14.4 14.4  WBC 11.5*  --   --   --   --  12.8* 15.8*  PLT 262  --   --   --   --  274 218  NA  --   --  133*  --   --  131* 134*  K  --   < > 4.4  --   --  3.7 4.2  CL  --   --  95*  --   --  98 99  CO2  --   --   --   --   --  22 24  GLUCOSE  --   --  136*  --   --  182* 134*  BUN  --   --  11  --   --  10 8  CREATININE  --   --  0.90  --   --  0.53 0.71  CALCIUM  --   --   --   --   --  8.6 9.2  MG  --   --   --   --   --   --  2.2  PHOS  --   --   --   --   --   --  3.0  APTT  --   --   --  41*  --   --   --   INR  --   --   --  0.99  --   --   --   PHART  --   --   --   --  7.433  --   --   PCO2ART  --   --   --   --  32.8*  --   --   PO2ART  --   --   --   --  117.0*  --   --   < > = values in this interval not displayed.  Recent Labs Lab 09/19/12 1538 09/19/12 1928 09/19/12 2351 09/20/12 0416 09/20/12 0741  GLUCAP 138* 139* 119* 128* 128*    CXR No film 4/5  ASSESSMENT / PLAN: Principal Problem:   Subdural hematoma Active Problems:   HYPOTHYROIDISM   HYPERTENSION   Diabetes mellitus type II, uncontrolled   Respiratory failure, post-operative   S/P craniotomy   NEUROLOGIC A:   SDH, s/p craniotomy and hematoma evacuation    P:   - Per NSGY. - Continue with Keppra infusion as per neurosurgery .  PULMONARY A:  Post surgery respiratory failure. S/p extubation 4/4 P:   - monitor on RA -high risk need for reintubation mental status dependent  CARDIOVASCULAR A: Elevated blood pressure. Better 09/20/12 P:  - IV Hydralazine given bradycardia.  - Goal SBP < 160 per neurosurgery  RENAL: No acute issues. Forming urine.  P:   - No intervention. -bmet daily  GASTROINTESTINAL A:  Failed swallow eval post extubation.  Not able to protect airway P:   -  change to per tube PPI -place NG -start TF  HEMATOLOGIC A: No acute issues. No active bleeding. Drain with 25 cc of blood P:  - CBC in AM -SCDs for DVT proph, cannot get hep/lmwh  INFECTIOUS A: No active issues, mild leukocytosis P:   - No intervention. - CBC in AM.  ENDOCRINE A:  Mild hyperglycemia, hypothyroidism   P:   - SSI. - Continue with synthroid.  TODAY'S SUMMARY: 79 year old man, with past medical history of hypertension, and hypothyroidism, admitted with a large right subdural hematoma. Had acute respiratory failure post subdural evacuation along with severe hypertension post op now better 09/20/12. Now extubated 4/4.  Poor mental status and not able to swallow. High risk aspiration..  I have personally obtained a history, examined the patient, evaluated laboratory and imaging results, formulated the assessment and plan and placed orders.  CRITICAL CARE: The patient is critically ill with multiple organ systems failure and requires high complexity decision making for assessment and support, frequent evaluation and titration of therapies, application of advanced monitoring technologies and extensive interpretation of multiple databases. Critical Care Time devoted to patient care services described in this note is 35 minutes.   Dorcas Carrow Beeper  [phone]  Cell  [phone]  If no response or cell goes to voicemail, call beeper [phone]  09/20/2012

## 2012-09-20 NOTE — Plan of Care (Signed)
Problem: Phase I Progression Outcomes Goal: Neuro exam at baseline or improved Outcome: Progressing 09/20/12: Pt remains lethargic, is oriented to person and occasionally place. Pupils remain equal and follows basic commands. Goal: Respiratory status stable Outcome: Progressing 09/20/12: Pt remains on room air with O2 saturation >94% Goal: Pain controlled with appropriate interventions Outcome: Progressing 09/20/12: Pt has declined any pain and shows no evidence of pain on CPOT Goal: Voiding-avoid urinary catheter unless indicated Outcome: Completed/Met Date Met:  09/20/12 09/20/12: Pt has been successful with condom catheter use.

## 2012-09-20 NOTE — Progress Notes (Signed)
[name], male   DOB: [birth year], 79 y.o.   MRN: [id number] Drain removed, staples to wound. F/c off and on.speech to see

## 2012-09-20 NOTE — Progress Notes (Signed)
PT Cancellation Note  Patient Details Name: PETROS AHART MRN: [id number] DOB: 1934-06-14   Cancelled Treatment:    Reason Eval/Treat Not Completed: Fatigue/lethargy limiting ability to participate.  Will check another time.     Sunny Schlein, Wilmer [phone] 09/20/2012, 11:51 AM

## 2012-09-21 DIAGNOSIS — R1310 Dysphagia, unspecified: Secondary | ICD-10-CM | POA: Diagnosis present

## 2012-09-21 LAB — BASIC METABOLIC PANEL
CO2: 22 mEq/L (ref 19–32)
Chloride: 100 mEq/L (ref 96–112)
Potassium: 3.5 mEq/L (ref 3.5–5.1)
Sodium: 134 mEq/L — ABNORMAL LOW (ref 135–145)

## 2012-09-21 LAB — GLUCOSE, CAPILLARY
Glucose-Capillary: 126 mg/dL — ABNORMAL HIGH (ref 70–99)
Glucose-Capillary: 142 mg/dL — ABNORMAL HIGH (ref 70–99)
Glucose-Capillary: 164 mg/dL — ABNORMAL HIGH (ref 70–99)
Glucose-Capillary: 168 mg/dL — ABNORMAL HIGH (ref 70–99)

## 2012-09-21 LAB — CBC
HCT: 41.9 % (ref 39.0–52.0)
Hemoglobin: 14.6 g/dL (ref 13.0–17.0)
MCV: 80.1 fL (ref 78.0–100.0)
Platelets: 278 10*3/uL (ref 150–400)
RBC: 5.23 MIL/uL (ref 4.22–5.81)
WBC: 13.6 10*3/uL — ABNORMAL HIGH (ref 4.0–10.5)

## 2012-09-21 MED ORDER — WHITE PETROLATUM GEL
Status: AC
  Filled 2012-09-21: qty 5

## 2012-09-21 MED ORDER — METOPROLOL TARTRATE 1 MG/ML IV SOLN
5.0000 mg | Freq: Once | INTRAVENOUS | Status: AC
  Administered 2012-09-21: 5 mg via INTRAVENOUS
  Filled 2012-09-21: qty 5

## 2012-09-21 NOTE — Progress Notes (Signed)
eLink Physician-Brief Progress Note Patient Name: Gerald Leach DOB: Sep 05, 1933 MRN: [id number]  Date of Service  09/21/2012   HPI/Events of Note  New onset of AF with rates in the 120s.  HD stable with BP of 144/80 (95)   eICU Interventions  Plan: 12 lead EKG Cycle trop Lopressor 5 mg IV times one dose now   Intervention Category Intermediate Interventions: Arrhythmia - evaluation and management  [name] 09/21/2012, 4:48 AM

## 2012-09-21 NOTE — Progress Notes (Signed)
PULMONARY  / CRITICAL CARE MEDICINE  Name: Gerald Leach MRN: [id number] DOB: 12-18-1933    ADMISSION DATE:  09/18/2012 CONSULTATION DATE: 09/21/2012   REFERRING MD :  Dr Phoebe Perch PRIMARY SERVICE: Neurosurgery   CHIEF COMPLAINT:  Acute encephalopathy  BRIEF PATIENT DESCRIPTION: 79 year old man, with past medical history of hypertension, and hypothyroidism, admitted with a large right subdural hematoma. Has acute respiratory failure post subdural evacuation along with severe hypertension.   SIGNIFICANT EVENTS / STUDIES:  Head CT scan-right large subdural hematoma with 1.44 cm midline shift. 4/4 > Craniotomy/hematoma evacuation 4/4 extubation postop  LINES / TUBES: 09/19/2012 - ETT.>>4/4   CULTURES: None  ANTIBIOTICS: IV Ancef periop   SUBJECTIVE:   Confused, agitated at times, not protecting airway, getting TF  VITAL SIGNS: Temp:  [97.5 F (36.4 C)-99.6 F (37.6 C)] 98.8 F (37.1 C) (04/06 0427) Pulse Rate:  [68-109] 68 (04/06 0700) Resp:  [15-28] 25 (04/06 0700) BP: (124-173)/(64-95) 139/84 mmHg (04/06 0700) SpO2:  [92 %-100 %] 98 % (04/06 0700) Weight:  [82.2 kg (181 lb 3.5 oz)] 82.2 kg (181 lb 3.5 oz) (04/06 0500) HEMODYNAMICS: CV stable   VENTILATOR SETTINGS: Extubated to RA 4/4.  4/5 on RA sats 98-100%   INTAKE / OUTPUT: Intake/Output     04/05 0701 - 04/06 0700 04/06 0701 - 04/07 0700   I.V. (mL/kg) 1781.3 (21.7)    Other     NG/GT 420    IV Piggyback 105    Total Intake(mL/kg) 2306.3 (28.1)    Urine (mL/kg/hr) 380 (0.2)    Drains     Total Output 380     Net +1926.3          Urine Occurrence 7 x      PHYSICAL EXAMINATION: General:  Lethargic, no distress Neuro:lethargic, will not f/c.  Non focal HEENT: Right craniotomy without active bleeding. NGT in place Cardiovascular:  Heart sounds heard, regular and normal. No added sounds. No murmurs. Lungs: very rhonchus no accessory muscle use  Abdomen: Nondistended, nontender, normal bowel sounds. No  palpable masses. Musculoskeletal: Unremarkable  Skin: No rashes.  Recent Labs Lab 09/19/12 0420 09/20/12 0440 09/21/12 0455  NA 131* 134* 134*  K 3.7 4.2 3.5  CL 98 99 100  CO2 22 24 22   BUN 10 8 9   CREATININE 0.53 0.71 0.59  GLUCOSE 182* 134* 163*    Recent Labs Lab 09/19/12 0420 09/20/12 0440 09/21/12 0455  HGB 14.4 14.4 14.6  HCT 41.2 41.9 41.9  WBC 12.8* 15.8* 13.6*  PLT 274 218 278    Recent Labs Lab 09/19/12 1928 09/19/12 2351 09/20/12 0416 09/20/12 0741 09/20/12 1234  GLUCAP 139* 119* 128* 128* 110*    CXR No film 4/6  ASSESSMENT / PLAN: Principal Problem:   Subdural hematoma Active Problems:   HYPOTHYROIDISM   HYPERTENSION   Diabetes mellitus type II, uncontrolled   Respiratory failure, post-operative   S/P craniotomy   NEUROLOGIC A:   SDH, s/p craniotomy and hematoma evacuation    P:   - Per NSGY. - Continue with Keppra infusion as per neurosurgery .  PULMONARY A:  Post surgery respiratory failure. S/p extubation 4/4 -high risk need for reintubation mental status dependent P:   - monitor on RA - NPO  CARDIOVASCULAR A:HTN (better control as of 4/5 and 4/6) P:  - IV Hydralazine given bradycardia.  - Goal SBP < 160 per neurosurgery  RENAL: No acute issues. Forming urine.  P:   - No intervention. - bmet  daily  GASTROINTESTINAL A:  Failed swallow eval post extubation.  Not able to protect airway P:   - cont tubefeeds -will likely need PEG  HEMATOLOGIC A: No acute issues. No active bleeding. Drain with 25 cc of blood P:  - CBC in AM -SCDs for DVT proph, cannot get hep/lmwh  INFECTIOUS A: No active issues, mild leukocytosis P:   - No intervention. - CBC in AM.  ENDOCRINE A:  Mild hyperglycemia, hypothyroidism   P:   - SSI. - Continue with synthroid.  TODAY'S SUMMARY: 79 year old man, with past medical history of hypertension, and hypothyroidism, admitted with a large right subdural hematoma. Had acute respiratory  failure post subdural evacuation along with severe hypertension post op now better 09/20/12. Now extubated 4/4.  Poor mental status and not able to swallow. High risk aspiration.. High risk needing vent again  I have personally obtained a history, examined the patient, evaluated laboratory and imaging results, formulated the assessment and plan and placed orders.  CRITICAL CARE: The patient is critically ill with multiple organ systems failure and requires high complexity decision making for assessment and support, frequent evaluation and titration of therapies, application of advanced monitoring technologies and extensive interpretation of multiple databases. Critical Care Time devoted to patient care services described in this note is 35 minutes.   Dorcas Carrow Beeper  [phone]  Cell  [phone]  If no response or cell goes to voicemail, call beeper [phone]  09/21/2012

## 2012-09-21 NOTE — Progress Notes (Signed)
[name], male   DOB: 12-01-1933, 79 y.o.   MRN: [id number] Wound dry, no more bleeding. Off and on following commands

## 2012-09-21 NOTE — Evaluation (Signed)
Physical Therapy Evaluation Patient Details Name: Gerald Leach MRN: [id number] DOB: Aug 08, 1933 Today's Date: 09/21/2012 Time: 4098-1191 PT Time Calculation (min): 26 min  PT Assessment / Plan / Recommendation Clinical Impression  Pt s/p craniotomy after SDH with decr mobility secondary to poor coordination, poor postural control and due to lethargy.  Will benefit from PT to address mobility issues.  Son present and this PT spoke to son about the need for his father to get rehab therapy at a NH prior to d/c home. Pts wife had back surgery last week and given that he has many deficits, he will need incr recovery time that NH can provide.  Son understands.       PT Assessment  Patient needs continued PT services    Follow Up Recommendations  SNF;Supervision/Assistance - 24 hour         Barriers to Discharge Decreased caregiver support      Equipment Recommendations  Other (comment) (TBA)         Frequency Min 3X/week    Precautions / Restrictions Precautions Precautions: Fall Restrictions Weight Bearing Restrictions: No   Pertinent Vitals/Pain VSS, No pain      Mobility  Bed Mobility Bed Mobility: Rolling Left;Left Sidelying to Sit;Sitting - Scoot to Edge of Bed Rolling Left: 1: +2 Total assist;With rail Rolling Left: Patient Percentage: 30% Left Sidelying to Sit: 1: +2 Total assist;With rails;HOB flat Left Sidelying to Sit: Patient Percentage: 20% Sitting - Scoot to Edge of Bed: 1: +1 Total assist Details for Bed Mobility Assistance: Pt unable to assist as he was lethargic.  Once on his side with PT assisting LES off bed, he assisted a little from side to sit.  Once sitting, pt for the most part was working against PT with hard posterior lean and inabiltiy to follow any commands.   Transfers Transfers: Not assessed Ambulation/Gait Ambulation/Gait Assistance: Not tested (comment) Stairs: No Wheelchair Mobility Wheelchair Mobility: No         PT Diagnosis:  Generalized weakness  PT Problem List: Decreased activity tolerance;Decreased balance;Decreased mobility;Decreased coordination;Decreased cognition;Decreased knowledge of use of DME;Decreased safety awareness;Decreased knowledge of precautions;Impaired tone PT Treatment Interventions: DME instruction;Gait training;Functional mobility training;Therapeutic activities;Therapeutic exercise;Balance training;Neuromuscular re-education;Patient/family education;Cognitive remediation   PT Goals Acute Rehab PT Goals PT Goal Formulation: With patient Time For Goal Achievement: 10/05/12 Potential to Achieve Goals: Good Pt will go Supine/Side to Sit: with min assist;with rail PT Goal: Supine/Side to Sit - Progress: Goal set today Pt will Sit at Edge of Bed: with min assist;6-10 min;with bilateral upper extremity support PT Goal: Sit at Edge Of Bed - Progress: Goal set today Pt will go Sit to Stand: with mod assist;with upper extremity assist PT Goal: Sit to Stand - Progress: Goal set today Pt will Transfer Bed to Chair/Chair to Bed: with mod assist PT Transfer Goal: Bed to Chair/Chair to Bed - Progress: Goal set today Pt will Ambulate: 16 - 50 feet;with +2 total assist;with least restrictive assistive device;Other (comment) (pt = 60%) PT Goal: Ambulate - Progress: Goal set today Pt will Perform Home Exercise Program: with min assist PT Goal: Perform Home Exercise Program - Progress: Goal set today  Visit Information  Last PT Received On: 09/21/12 Assistance Needed: +2    Subjective Data  Subjective: Pt with no verbalizations Patient Stated Goal: Family wants for pt to go home after rehab    Prior Functioning  Home Living Lives With: Spouse Available Help at Discharge: Family;Available 24 hours/day;Other (Comment) (wife had back  surgery last week per son) Type of Home: House Home Access: Level entry Home Layout: Multi-level Alternate Level Stairs-Number of Steps: 7 Alternate Level  Stairs-Rails: None Bathroom Shower/Tub: Engineer, manufacturing systems: Standard Home Adaptive Equipment: Straight cane Additional Comments: Pt fell 6 months ago and has used cane since that time but son states no real balance deficits prior to this admit Prior Function Level of Independence: Independent with assistive device(s) Able to Take Stairs?: Yes Driving: Yes Vocation: Retired Musician:  (not communicating)    Cognition  Cognition Overall Cognitive Status: Impaired Area of Impairment: Attention;Memory;Following commands;Safety/judgement;Awareness of errors;Awareness of deficits;Problem solving Arousal/Alertness: Lethargic Orientation Level: Other (comment) (unable to assesss) Behavior During Session: Lethargic Current Attention Level: Other (comment) (unable to assess) Following Commands: Follows one step commands inconsistently Safety/Judgement: Decreased awareness of safety precautions;Decreased safety judgement for tasks assessed;Impulsive;Decreased awareness of need for assistance Safety/Judgement - Other Comments: Pt has hand restraints and waist belt for safety    Extremity/Trunk Assessment Right Lower Extremity Assessment RLE ROM/Strength/Tone: [facility] for tasks assessed Left Lower Extremity Assessment LLE ROM/Strength/Tone: WFL for tasks assessed   Balance Static Sitting Balance Static Sitting - Balance Support: Right upper extremity supported;Feet supported Static Sitting - Level of Assistance: 1: +2 Total assist;Patient percentage (comment) (pt = 20%) Static Sitting - Comment/# of Minutes: Pt needed total assist of 2 persons while sitting at EOB.  Pt leaning significantly posterior needing incr assist.  If allowed to use right UE, pt pushing himself to left side and if not using UE, still leaning posteriorly so significantly needed 2 person total assist.  After attempting sitting like this for 4 minutes, assisted pt to supine as he never demonstrated  any postural control or stability.    End of Session PT - End of Session Equipment Utilized During Treatment: Gait belt Activity Tolerance: Patient limited by fatigue Patient left: in bed;with call bell/phone within reach;with restraints reapplied Nurse Communication: Mobility status;Need for lift equipment       INGOLD,Sebastyan Snodgrass 09/21/2012, 1:36 PM [facility] [phone] (pager)

## 2012-09-21 NOTE — Progress Notes (Signed)
SLP Cancellation Note  Patient Details Name: Gerald Leach MRN: [id number] DOB: April 28, 1934   Cancelled treatment: Attempt x1 to reassess swallow for PO readiness and not completed due to patient presenting with continued lethargy increasing risk for aspiration with PO trials.  ST to follow for POC.  Moreen Fowler MS, CCC-SLP [phone] [facility] 09/21/2012, 2:20 PM

## 2012-09-22 ENCOUNTER — Encounter (HOSPITAL_COMMUNITY): Payer: Self-pay | Admitting: Anesthesiology

## 2012-09-22 ENCOUNTER — Inpatient Hospital Stay (HOSPITAL_COMMUNITY): Payer: Medicare Other

## 2012-09-22 ENCOUNTER — Encounter (HOSPITAL_COMMUNITY): Payer: Self-pay | Admitting: Certified Registered"

## 2012-09-22 ENCOUNTER — Inpatient Hospital Stay (HOSPITAL_COMMUNITY): Payer: Medicare Other | Admitting: Certified Registered"

## 2012-09-22 ENCOUNTER — Encounter (HOSPITAL_COMMUNITY)
Admission: EM | Disposition: A | Discharge status: Rehabilitation Facility | Payer: Self-pay | Source: Home / Self Care | Attending: Neurosurgery

## 2012-09-22 DIAGNOSIS — Z9889 Other specified postprocedural states: Secondary | ICD-10-CM

## 2012-09-22 HISTORY — PX: CRANIOTOMY: SHX93

## 2012-09-22 LAB — GLUCOSE, CAPILLARY
Glucose-Capillary: 184 mg/dL — ABNORMAL HIGH (ref 70–99)
Glucose-Capillary: 175 mg/dL — ABNORMAL HIGH (ref 70–99)
Glucose-Capillary: 176 mg/dL — ABNORMAL HIGH (ref 70–99)

## 2012-09-22 LAB — BLOOD GAS, ARTERIAL
Drawn by: 27733
MECHVT: 560 mL
PEEP: 5 cmH2O
Patient temperature: 98.6
RATE: 18 resp/min
TCO2: 21.2 mmol/L (ref 0–100)
pCO2 arterial: 27.8 mmHg — ABNORMAL LOW (ref 35.0–45.0)
pH, Arterial: 7.478 — ABNORMAL HIGH (ref 7.350–7.450)

## 2012-09-22 LAB — CBC
MCV: 80.8 fL (ref 78.0–100.0)
Platelets: 285 10*3/uL (ref 150–400)
RDW: 13.4 % (ref 11.5–15.5)
WBC: 11.7 10*3/uL — ABNORMAL HIGH (ref 4.0–10.5)

## 2012-09-22 LAB — COMPREHENSIVE METABOLIC PANEL
AST: 52 U/L — ABNORMAL HIGH (ref 0–37)
Albumin: 2.7 g/dL — ABNORMAL LOW (ref 3.5–5.2)
Calcium: 8.6 mg/dL (ref 8.4–10.5)
Chloride: 100 mEq/L (ref 96–112)
Creatinine, Ser: 0.6 mg/dL (ref 0.50–1.35)
Total Bilirubin: 0.5 mg/dL (ref 0.3–1.2)
Total Protein: 6.9 g/dL (ref 6.0–8.3)

## 2012-09-22 LAB — TROPONIN I: Troponin I: <0.3 ng/mL (ref <0.30)

## 2012-09-22 SURGERY — CRANIOTOMY HEMATOMA EVACUATION SUBDURAL
Anesthesia: General | Laterality: Right | Wound class: Clean

## 2012-09-22 MED ORDER — CEFAZOLIN SODIUM-DEXTROSE 2-3 GM-% IV SOLR
INTRAVENOUS | Status: AC
  Filled 2012-09-22: qty 50

## 2012-09-22 MED ORDER — ARTIFICIAL TEARS OP OINT
TOPICAL_OINTMENT | OPHTHALMIC | Status: DC | PRN
  Administered 2012-09-22: 1 via OPHTHALMIC

## 2012-09-22 MED ORDER — POTASSIUM CHLORIDE 20 MEQ/15ML (10%) PO LIQD
40.0000 meq | Freq: Three times a day (TID) | ORAL | Status: DC
  Administered 2012-09-22: 40 meq
  Filled 2012-09-22 (×2): qty 30

## 2012-09-22 MED ORDER — CHLORHEXIDINE GLUCONATE 0.12 % MT SOLN
15.0000 mL | Freq: Two times a day (BID) | OROMUCOSAL | Status: DC
  Administered 2012-09-22 – 2012-09-26 (×8): 15 mL via OROMUCOSAL
  Filled 2012-09-22 (×8): qty 15

## 2012-09-22 MED ORDER — MIDAZOLAM HCL 2 MG/2ML IJ SOLN
1.0000 mg | INTRAMUSCULAR | Status: DC | PRN
  Administered 2012-09-22 (×2): 2 mg via INTRAVENOUS
  Administered 2012-09-22: 1 mg via INTRAVENOUS
  Administered 2012-09-23 (×4): 2 mg via INTRAVENOUS
  Filled 2012-09-22 (×8): qty 2

## 2012-09-22 MED ORDER — BACITRACIN 50000 UNITS IM SOLR
INTRAMUSCULAR | Status: AC
  Filled 2012-09-22: qty 1

## 2012-09-22 MED ORDER — ETOMIDATE 2 MG/ML IV SOLN
40.0000 mg | Freq: Once | INTRAVENOUS | Status: AC
  Administered 2012-09-22: 40 mg via INTRAVENOUS

## 2012-09-22 MED ORDER — SODIUM CHLORIDE 0.9 % IV SOLN
INTRAVENOUS | Status: AC
  Filled 2012-09-22: qty 500

## 2012-09-22 MED ORDER — ROCURONIUM BROMIDE 100 MG/10ML IV SOLN
INTRAVENOUS | Status: DC | PRN
  Administered 2012-09-22: 50 mg via INTRAVENOUS

## 2012-09-22 MED ORDER — BIOTENE DRY MOUTH MT LIQD
15.0000 mL | Freq: Four times a day (QID) | OROMUCOSAL | Status: DC
  Administered 2012-09-22: 15 mL via OROMUCOSAL

## 2012-09-22 MED ORDER — PHENYLEPHRINE HCL 10 MG/ML IJ SOLN
10.0000 mg | INTRAVENOUS | Status: DC | PRN
  Administered 2012-09-22: 50 ug/min via INTRAVENOUS

## 2012-09-22 MED ORDER — BACITRACIN ZINC 500 UNIT/GM EX OINT
TOPICAL_OINTMENT | CUTANEOUS | Status: DC | PRN
  Administered 2012-09-22: 1 via TOPICAL

## 2012-09-22 MED ORDER — THROMBIN 20000 UNITS EX SOLR
CUTANEOUS | Status: DC | PRN
  Administered 2012-09-22: 10:00:00 via TOPICAL

## 2012-09-22 MED ORDER — HEMOSTATIC AGENTS (NO CHARGE) OPTIME
TOPICAL | Status: DC | PRN
  Administered 2012-09-22: 1 via TOPICAL

## 2012-09-22 MED ORDER — FENTANYL CITRATE 0.05 MG/ML IJ SOLN
INTRAMUSCULAR | Status: DC | PRN
  Administered 2012-09-22: 200 ug via INTRAVENOUS

## 2012-09-22 MED ORDER — PROPOFOL 10 MG/ML IV BOLUS
INTRAVENOUS | Status: DC | PRN
Start: 2012-09-22 — End: 2012-09-22
  Administered 2012-09-22: 100 mg via INTRAVENOUS
  Administered 2012-09-22: 50 mg via INTRAVENOUS

## 2012-09-22 MED ORDER — LIDOCAINE HCL (CARDIAC) 20 MG/ML IV SOLN
INTRAVENOUS | Status: DC | PRN
  Administered 2012-09-22: 100 mg via INTRAVENOUS

## 2012-09-22 MED ORDER — SODIUM CHLORIDE 0.9 % IV BOLUS (SEPSIS)
500.0000 mL | INTRAVENOUS | Status: AC
  Administered 2012-09-22: 500 mL via INTRAVENOUS

## 2012-09-22 MED ORDER — LACTATED RINGERS IV SOLN
INTRAVENOUS | Status: DC | PRN
  Administered 2012-09-22: 10:00:00 via INTRAVENOUS

## 2012-09-22 MED ORDER — HYDROMORPHONE HCL PF 1 MG/ML IJ SOLN: 0.2500 mg | INTRAMUSCULAR | Status: DC | PRN

## 2012-09-22 MED ORDER — BIOTENE DRY MOUTH MT LIQD
15.0000 mL | Freq: Four times a day (QID) | OROMUCOSAL | Status: DC
  Administered 2012-09-23 – 2012-09-27 (×17): 15 mL via OROMUCOSAL

## 2012-09-22 MED ORDER — MIDAZOLAM HCL 5 MG/5ML IJ SOLN
INTRAMUSCULAR | Status: DC | PRN
  Administered 2012-09-22: 2 mg via INTRAVENOUS

## 2012-09-22 MED ORDER — 0.9 % SODIUM CHLORIDE (POUR BTL) OPTIME
TOPICAL | Status: DC | PRN
  Administered 2012-09-22: 1000 mL

## 2012-09-22 MED ORDER — CEFAZOLIN SODIUM-DEXTROSE 2-3 GM-% IV SOLR: 2.0000 g | INTRAVENOUS | Status: DC

## 2012-09-22 MED ORDER — SODIUM CHLORIDE 0.9 % IV SOLN
INTRAVENOUS | Status: DC | PRN
  Administered 2012-09-22: 12:00:00 via INTRAVENOUS

## 2012-09-22 MED ORDER — FENTANYL CITRATE 0.05 MG/ML IJ SOLN
25.0000 ug | INTRAMUSCULAR | Status: DC | PRN
  Administered 2012-09-22 – 2012-09-23 (×9): 50 ug via INTRAVENOUS
  Filled 2012-09-22 (×8): qty 2

## 2012-09-22 MED ORDER — SODIUM CHLORIDE 0.9 % IR SOLN
Status: DC | PRN
  Administered 2012-09-22: 10:00:00

## 2012-09-22 MED ORDER — ONDANSETRON HCL 4 MG/2ML IJ SOLN: 4.0000 mg | Freq: Once | INTRAMUSCULAR | Status: DC | PRN

## 2012-09-22 SURGICAL SUPPLY — 72 items
BAG DECANTER FOR FLEXI CONT (MISCELLANEOUS) ×2 IMPLANT
BANDAGE GAUZE 4  KLING STR (GAUZE/BANDAGES/DRESSINGS) ×2 IMPLANT
BANDAGE GAUZE ELAST BULKY 4 IN (GAUZE/BANDAGES/DRESSINGS) IMPLANT
BENZOIN TINCTURE PRP APPL 2/3 (GAUZE/BANDAGES/DRESSINGS) ×2 IMPLANT
BIT DRILL WIRE PASS 1.3MM (BIT) IMPLANT
BLADE CLIPPER SURG NEURO (BLADE) IMPLANT
BRUSH SCRUB EZ PLAIN DRY (MISCELLANEOUS) ×2 IMPLANT
BUR ACORN 6.0 PRECISION (BURR) ×2 IMPLANT
BUR ROUTER D-58 CRANI (BURR) IMPLANT
CANISTER SUCTION 2500CC (MISCELLANEOUS) ×2 IMPLANT
CLIP TI MEDIUM 6 (CLIP) IMPLANT
CLOTH BEACON ORANGE TIMEOUT ST (SAFETY) ×2 IMPLANT
CONT SPEC 4OZ CLIKSEAL STRL BL (MISCELLANEOUS) ×2 IMPLANT
CORDS BIPOLAR (ELECTRODE) ×2 IMPLANT
DRAIN JACKSON PRATT 10MM FLAT (MISCELLANEOUS) ×2 IMPLANT
DRAPE NEUROLOGICAL W/INCISE (DRAPES) ×2 IMPLANT
DRAPE SURG 17X23 STRL (DRAPES) IMPLANT
DRAPE WARM FLUID 44X44 (DRAPE) ×2 IMPLANT
DRESSING TELFA 8X3 (GAUZE/BANDAGES/DRESSINGS) ×2 IMPLANT
DRILL WIRE PASS 1.3MM (BIT)
DRSG OPSITE 4X5.5 SM (GAUZE/BANDAGES/DRESSINGS) ×2 IMPLANT
ELECT CAUTERY BLADE 6.4 (BLADE) ×2 IMPLANT
ELECT NEEDLE TIP 2.8 STRL (NEEDLE) ×2 IMPLANT
ELECT REM PT RETURN 9FT ADLT (ELECTROSURGICAL) ×2
ELECTRODE REM PT RTRN 9FT ADLT (ELECTROSURGICAL) ×1 IMPLANT
EVACUATOR SILICONE 100CC (DRAIN) ×2 IMPLANT
GAUZE SPONGE 4X4 16PLY XRAY LF (GAUZE/BANDAGES/DRESSINGS) IMPLANT
GLOVE BIOGEL PI IND STRL 8 (GLOVE) ×1 IMPLANT
GLOVE BIOGEL PI IND STRL 8.5 (GLOVE) ×1 IMPLANT
GLOVE BIOGEL PI INDICATOR 8 (GLOVE) ×1
GLOVE BIOGEL PI INDICATOR 8.5 (GLOVE) ×1
GLOVE ECLIPSE 7.5 STRL STRAW (GLOVE) ×6 IMPLANT
GLOVE EXAM NITRILE LRG STRL (GLOVE) IMPLANT
GLOVE EXAM NITRILE MD LF STRL (GLOVE) ×2 IMPLANT
GLOVE EXAM NITRILE XL STR (GLOVE) IMPLANT
GLOVE EXAM NITRILE XS STR PU (GLOVE) IMPLANT
GLOVE INDICATOR 8.0 STRL GRN (GLOVE) ×2 IMPLANT
GOWN BRE IMP SLV AUR LG STRL (GOWN DISPOSABLE) IMPLANT
GOWN BRE IMP SLV AUR XL STRL (GOWN DISPOSABLE) ×4 IMPLANT
GOWN STRL REIN 2XL LVL4 (GOWN DISPOSABLE) ×2 IMPLANT
HOOK DURA (MISCELLANEOUS) ×2 IMPLANT
KIT BASIN OR (CUSTOM PROCEDURE TRAY) ×2 IMPLANT
KIT ROOM TURNOVER OR (KITS) ×2 IMPLANT
NEEDLE HYPO 22GX1.5 SAFETY (NEEDLE) ×2 IMPLANT
NS IRRIG 1000ML POUR BTL (IV SOLUTION) ×4 IMPLANT
PACK CRANIOTOMY (CUSTOM PROCEDURE TRAY) ×2 IMPLANT
PAD ARMBOARD 7.5X6 YLW CONV (MISCELLANEOUS) ×2 IMPLANT
PATTIES SURGICAL .25X.25 (GAUZE/BANDAGES/DRESSINGS) IMPLANT
PATTIES SURGICAL .5 X.5 (GAUZE/BANDAGES/DRESSINGS) IMPLANT
PATTIES SURGICAL .5 X3 (DISPOSABLE) IMPLANT
PATTIES SURGICAL .75X.75 (GAUZE/BANDAGES/DRESSINGS) ×2 IMPLANT
PATTIES SURGICAL 1X1 (DISPOSABLE) IMPLANT
PIN MAYFIELD SKULL DISP (PIN) IMPLANT
PLATE 1.5  2HOLE MED NEURO (Plate) ×1 IMPLANT
PLATE 1.5 2HOLE MED NEURO (Plate) ×1 IMPLANT
PLATE 1.5 5HOLE SQUARE (Plate) ×2 IMPLANT
SCREW SELF DRILL HT 1.5/4MM (Screw) ×22 IMPLANT
SPECIMEN JAR SMALL (MISCELLANEOUS) IMPLANT
SPONGE GAUZE 4X4 12PLY (GAUZE/BANDAGES/DRESSINGS) ×2 IMPLANT
SPONGE NEURO XRAY DETECT 1X3 (DISPOSABLE) IMPLANT
STAPLER SKIN PROX WIDE 3.9 (STAPLE) ×2 IMPLANT
SUT ETHILON 3 0 FSL (SUTURE) IMPLANT
SUT NURALON 4 0 TR CR/8 (SUTURE) ×4 IMPLANT
SUT VIC AB 2-0 CP2 18 (SUTURE) ×4 IMPLANT
SYR 20ML ECCENTRIC (SYRINGE) ×2 IMPLANT
SYR CONTROL 10ML LL (SYRINGE) ×2 IMPLANT
TAPE PAPER MEDFIX 1IN X 10YD (GAUZE/BANDAGES/DRESSINGS) ×2 IMPLANT
TOWEL OR 17X24 6PK STRL BLUE (TOWEL DISPOSABLE) ×2 IMPLANT
TOWEL OR 17X26 10 PK STRL BLUE (TOWEL DISPOSABLE) ×2 IMPLANT
TRAY FOLEY CATH 14FRSI W/METER (CATHETERS) IMPLANT
UNDERPAD 30X30 INCONTINENT (UNDERPADS AND DIAPERS) IMPLANT
WATER STERILE IRR 1000ML POUR (IV SOLUTION) ×2 IMPLANT

## 2012-09-22 NOTE — Progress Notes (Addendum)
Subjective: Patient lethargic  Objective: Vital signs in last 24 hours: Temp:  [98.1 F (36.7 C)-100.4 F (38 C)] 99.4 F (37.4 C) (04/07 0745) Pulse Rate:  [79-107] 99 (04/07 0700) Resp:  [16-27] 23 (04/07 0700) BP: (99-173)/(57-108) 141/83 mmHg (04/07 0700) SpO2:  [96 %-99 %] 98 % (04/07 0700)  Intake/Output from previous day: 04/06 0701 - 04/07 0700 In: 3545 [I.V.:1800; NG/GT:1590; IV Piggyback:155] Out: -  Intake/Output this shift:    arouses - Ox1  , moves all 4 , does not FC  Lab Results:  Recent Labs  09/21/12 0455 09/22/12 0500  WBC 13.6* 11.7*  HGB 14.6 14.0  HCT 41.9 41.3  PLT 278 285   BMET  Recent Labs  09/21/12 0455 09/22/12 0500  NA 134* 135  K 3.5 3.8  CL 100 100  CO2 22 26  GLUCOSE 163* 182*  BUN 9 11  CREATININE 0.59 0.60  CALCIUM 8.9 8.6    Studies/Results: Dg Abd Portable 1v  09/20/2012  *RADIOLOGY REPORT*  Clinical Data: Nasogastric tube placement.  PORTABLE ABDOMEN - 1 VIEW  Comparison: 16 hours  Findings: Film at 1630 hours demonstrates advancement of a nasogastric tube into the stomach with the tip located in the proximal stomach.  Underlying bowel gas pattern is unremarkable.  IMPRESSION: Nasogastric tube tip lies in the proximal stomach.   Original Report Authenticated By: Irish Lack, M.D.    Dg Abd Portable 1v  09/20/2012  *RADIOLOGY REPORT*  Clinical Data: Nasogastric tube placement  PORTABLE ABDOMEN - 1 VIEW  Comparison:   the previous day's study  Findings: Feeding tube extends into the right lower lobe bronchus. Visualized bowel gas pattern unremarkable.  IMPRESSION:  Feeding tube malpositioned into the right lower lobe bronchus.   Original Report Authenticated By: D. Andria Rhein, MD     Assessment/Plan: Pt not as alert as couple days ago - CT head   LOS: 4 days   Ct showed recurrent SDH with shift  - after discussion with pt's wife , we decided to go to the OR to evacuate SDH  Zacchary Pompei R, MD 09/22/2012, 7:55 AM

## 2012-09-22 NOTE — Progress Notes (Signed)
AFib (HR 95-110 )noticed on cardiac monitor;   Plan: obtain EKG and cardiac enzymes; not a candidate for anticoagulation; consider diltiazem/metoprolol for HR persistently >110

## 2012-09-22 NOTE — Progress Notes (Signed)
Pt was receiving bath and was becoming agitated with heart rate into 140-150's and BP 170-200's. Went to retrieve medication to relax pt and when returning to the room the pt tube was inside mouth but had come out. Called RT placed on 4L [place] and notified CCM of extubation. MD will come see pt. Pt currently satting at 96% 4L [place],

## 2012-09-22 NOTE — Anesthesia Postprocedure Evaluation (Signed)
  Anesthesia Post-op Note  Patient: Gerald Leach  Procedure(s) Performed: Procedure(s): CRANIOTOMY HEMATOMA EVACUATION SUBDURAL (Right)  Patient Location: ICU  Anesthesia Type:General  Level of Consciousness: sedated and Patient remains intubated per anesthesia plan  Airway and Oxygen Therapy: Patient remains intubated per anesthesia plan and Patient placed on Ventilator (see vital sign flow sheet for setting)  Post-op Pain: none  Post-op Assessment: Post-op Vital signs reviewed, Patient's Cardiovascular Status Stable, Respiratory Function Stable, Patent Airway, No signs of Nausea or vomiting and Pain level controlled  Post-op Vital Signs: stable  Complications: No apparent anesthesia complications

## 2012-09-22 NOTE — Anesthesia Preprocedure Evaluation (Addendum)
Anesthesia Evaluation  Patient identified by MRN, date of birth, ID band Patient awake    Reviewed: Allergy & Precautions, H&P , NPO status , Unable to perform ROS - Chart review only  Airway Mallampati: II TM Distance: >3 FB Neck ROM: Limited    Dental  (+) Teeth Intact, Caps and Dental Advisory Given,    Pulmonary    Pulmonary exam normal       Cardiovascular hypertension, Pt. on medications and Pt. on home beta blockers + Peripheral Vascular Disease + pacemaker Rhythm:Irregular Rate:Normal     Neuro/Psych PSYCHIATRIC DISORDERS Anxiety 09-21-12  CT Head IMPRESSION:    Recurrent bleeding in the right subdural space with increased midline shift with further compression of the right lateral ventricle and dilatation of the left lateral ventricle.       GI/Hepatic GERD-  Medicated and Controlled,  Endo/Other  diabetes, Type 2, Oral Hypoglycemic AgentsHypothyroidism   Renal/GU      Musculoskeletal  (+) Arthritis -,   Abdominal   Peds  Hematology   Anesthesia Other Findings Pt not communicating verbally  Crowns vs Bridge Upper Dentition  Reproductive/Obstetrics                        Anesthesia Physical Anesthesia Plan  ASA: III  Anesthesia Plan: General   Post-op Pain Management:    Induction: Intravenous  Airway Management Planned: Oral ETT  Additional Equipment: Arterial line  Intra-op Plan:   Post-operative Plan: Post-operative intubation/ventilation  Informed Consent:   Plan Discussed with: CRNA, Anesthesiologist and Surgeon  Anesthesia Plan Comments:         Anesthesia Quick Evaluation

## 2012-09-22 NOTE — Progress Notes (Signed)
**Note De-Identified  Obfuscation** RT note: ETT advanced 2cm per CXR; currently 26 @ lip

## 2012-09-22 NOTE — Progress Notes (Signed)
PT Cancellation Note  Patient Details Name: Gerald Leach MRN: [id number] DOB: February 01, 1934   Cancelled Treatment:    Reason Eval/Treat Not Completed: Patient at procedure or test/unavailable;Medical issues which prohibited therapy; in OR for evacuation of SDH extension.  Will check on pt tomorrow.   WYNN,CYNDI 09/22/2012, 10:38 AM

## 2012-09-22 NOTE — Procedures (Signed)
Intubation Procedure Note HORALD BIRKY [id number] 05-28-1934  Procedure: Intubation Indications: Airway protection and maintenance  Procedure Details Consent: Unable to obtain consent because of emergent medical necessity. Time Out: Verified patient identification, verified procedure, site/side was marked, verified correct patient position, special equipment/implants available, medications/allergies/relevent history reviewed, required imaging and test results available.  Performed  Maximum sterile technique was used including gloves and hand hygiene.  MAC and 4    Evaluation Hemodynamic Status: BP stable throughout; O2 sats: stable throughout Patient's Current Condition: stable Complications: No apparent complications Patient did tolerate procedure well. Chest X-ray ordered to verify placement.  CXR: pending.   Jayshawn Colston S. 09/22/2012

## 2012-09-22 NOTE — Progress Notes (Signed)
UR completed 

## 2012-09-22 NOTE — Anesthesia Procedure Notes (Signed)
Procedure Name: Intubation Date/Time: 09/22/2012 10:17 AM Performed by: Tyrone Nine Pre-anesthesia Checklist: Patient identified, Timeout performed, Emergency Drugs available, Suction available and Patient being monitored Patient Re-evaluated:Patient Re-evaluated prior to inductionOxygen Delivery Method: Circle system utilized Preoxygenation: Pre-oxygenation with 100% oxygen Intubation Type: IV induction Ventilation: Mask ventilation without difficulty Laryngoscope Size: Mac and 4 Grade View: Grade II Tube type: Subglottic suction tube Tube size: 8.0 mm Number of attempts: 1 Airway Equipment and Method: LTA kit utilized Placement Confirmation: ETT inserted through vocal cords under direct vision,  positive ETCO2 and breath sounds checked- equal and bilateral Secured at: 23 cm Tube secured with: Tape Dental Injury: Teeth and Oropharynx as per pre-operative assessment  Comments: Pt to remain intubated per Physician

## 2012-09-22 NOTE — Preoperative (Signed)
Beta Blockers   Tenormin 12.5 mgs PO 09-18-12

## 2012-09-22 NOTE — Transfer of Care (Signed)
Immediate Anesthesia Transfer of Care Note  Patient: Gerald Leach  Procedure(s) Performed: Procedure(s): CRANIOTOMY HEMATOMA EVACUATION SUBDURAL (Right)  Patient Location: ICU  Anesthesia Type:General  Level of Consciousness: sedated and Patient remains intubated per anesthesia plan  Airway & Oxygen Therapy: Patient remains intubated per anesthesia plan and Patient placed on Ventilator (see vital sign flow sheet for setting)  Post-op Assessment: Report given to PACU RN, Post -op Vital signs reviewed and stable and Patient moving all extremities  Post vital signs: stable  Complications: No apparent anesthesia complications

## 2012-09-22 NOTE — Progress Notes (Signed)
PULMONARY  / CRITICAL CARE MEDICINE  Name: Gerald Leach MRN: [id number] DOB: May 08, 1934    ADMISSION DATE:  09/18/2012 CONSULTATION DATE: 09/22/2012   REFERRING MD :  Dr Phoebe Perch PRIMARY SERVICE: Neurosurgery   CHIEF COMPLAINT:  Acute encephalopathy  BRIEF PATIENT DESCRIPTION: 79 year old man, with past medical history of hypertension, and hypothyroidism, admitted with a large right subdural hematoma. Has acute respiratory failure post subdural evacuation along with severe hypertension.   SIGNIFICANT EVENTS / STUDIES:  Head CT scan-right large subdural hematoma with 1.44 cm midline shift. 4/4 > Craniotomy/hematoma evacuation 4/4 extubation postop  LINES / TUBES: 09/19/2012 - ETT.>>4/4   CULTURES: None  ANTIBIOTICS: IV Ancef periop   SUBJECTIVE:   Confused, agitated at times, not protecting airway, getting TF  VITAL SIGNS: Temp:  [98.1 F (36.7 C)-100.1 F (37.8 C)] 99.4 F (37.4 C) (04/07 0745) Pulse Rate:  [79-106] 95 (04/07 0900) Resp:  [16-25] 24 (04/07 0900) BP: (99-173)/(57-108) 142/102 mmHg (04/07 0900) SpO2:  [96 %-99 %] 98 % (04/07 0900) HEMODYNAMICS: CV stable   VENTILATOR SETTINGS: Extubated to RA 4/4.  4/5 on RA sats 98-100%   INTAKE / OUTPUT: Intake/Output     04/06 0701 - 04/07 0700 04/07 0701 - 04/08 0700   I.V. (mL/kg) 1800 (21.9) 150 (1.8)   NG/GT 1590 140   IV Piggyback 155    Total Intake(mL/kg) 3545 (43.1) 290 (3.5)   Urine (mL/kg/hr)     Total Output       Net +3545 +290        Urine Occurrence 8 x 1 x     PHYSICAL EXAMINATION: General:  Lethargic, no distress Neuro:lethargic, will not f/c.  Non focal HEENT: Right craniotomy without active bleeding. NGT in place Cardiovascular:  Heart sounds heard, regular and normal. No added sounds. No murmurs. Lungs: very rhonchus no accessory muscle use  Abdomen: Nondistended, nontender, normal bowel sounds. No palpable masses. Musculoskeletal: Unremarkable  Skin: No rashes.  Recent  Labs Lab 09/20/12 0440 09/21/12 0455 09/22/12 0500  NA 134* 134* 135  K 4.2 3.5 3.8  CL 99 100 100  CO2 24 22 26   BUN 8 9 11   CREATININE 0.71 0.59 0.60  GLUCOSE 134* 163* 182*    Recent Labs Lab 09/20/12 0440 09/21/12 0455 09/22/12 0500  HGB 14.4 14.6 14.0  HCT 41.9 41.9 41.3  WBC 15.8* 13.6* 11.7*  PLT 218 278 285    Recent Labs Lab 09/21/12 1523 09/21/12 1935 09/21/12 2334 09/22/12 0313 09/22/12 0743  GLUCAP 138* 132* 142* 184* 175*    CXR No film 4/6  ASSESSMENT / PLAN: Principal Problem:   Subdural hematoma Active Problems:   HYPOTHYROIDISM   HYPERTENSION   Diabetes mellitus type II, uncontrolled   Respiratory failure, post-operative   S/P craniotomy   Dysphagia   NEUROLOGIC A:   SDH, s/p craniotomy and hematoma evacuation    P:   - Per NSGY, back to the OR 4/7. - Continue with Keppra infusion as per neurosurgery .  PULMONARY A:  Post surgery respiratory failure. S/p extubation 4/4 -high risk need for reintubation mental status dependent P:   - Reintubated, will maintain on full vent support. - TF. - Place OGT.  CARDIOVASCULAR A:HTN (better control as of 4/5 and 4/6) P:  - IV Hydralazine given bradycardia.  - Goal SBP < 160 per neurosurgery  RENAL: No acute issues. Forming urine.  P:   - No intervention. - BMET daily.  GASTROINTESTINAL A:  Failed swallow eval post  extubation.  Not able to protect airway P:   - Cont tubefeeds. - Will likely need PEG after trach placement due to poor mental status.  HEMATOLOGIC A: No acute issues. No active bleeding. Drain with 25 cc of blood P:  - CBC in AM. - SCDs for DVT proph, cannot get hep/lmwh.  INFECTIOUS A: No active issues, mild leukocytosis P:   - No intervention. - CBC in AM.  ENDOCRINE A:  Mild hyperglycemia, hypothyroidism   P:   - SSI. - Continue with synthroid.  TODAY'S SUMMARY: 79 year old man, with past medical history of hypertension, and hypothyroidism, admitted  with a large right subdural hematoma. Had acute respiratory failure post subdural evacuation along with severe hypertension post op now better 09/20/12. Now extubated 4/4.  Poor mental status and not able to swallow.  Reintubated today for surgical interventions per neuro status with head ct showing more blood, family agreed to surgical intervention.  I have personally obtained a history, examined the patient, evaluated laboratory and imaging results, formulated the assessment and plan and placed orders.  CRITICAL CARE: The patient is critically ill with multiple organ systems failure and requires high complexity decision making for assessment and support, frequent evaluation and titration of therapies, application of advanced monitoring technologies and extensive interpretation of multiple databases. Critical Care Time devoted to patient care services described in this note is 35 minutes.   Alyson Reedy, M.D. [facility] Pulmonary/Critical Care Medicine. Pager: [phone]. After hours pager: [phone].

## 2012-09-22 NOTE — Progress Notes (Signed)
SLP Cancellation Note  Patient Details Name: JAKOBE BLAU MRN: [id number] DOB: August 23, 1933   Cancelled treatment:       Reason Eval/Treat Not Completed: Medical issues which prohibited therapy (Patient on way to OR. Will f/u 4/8. )   Ferdinand Lango MA, CCC-SLP [phone]   Alaa Eyerman Meryl 09/22/2012, 9:46 AM

## 2012-09-22 NOTE — Procedures (Signed)
**Note De-Identified Evangelynn Lochridge Obfuscation** Extubation Procedure Note  Patient Details:   Name: TAVIN VERNET DOB: 02-13-1934 MRN: [id number]   Airway Documentation:  Airway 8 mm (Active)  Secured at (cm) 23 cm 09/22/2012 12:40 PM  Measured From Lips 09/22/2012 12:40 PM  Secured Location Left 09/22/2012 12:40 PM  Secured By Caron Presume Tape 09/22/2012 12:40 PM    Evaluation  O2 sats: stable throughout Complications: No apparent complications Patient did tolerate procedure well. Bilateral Breath Sounds: Clear Suctioning: Airway Yes Patient self-extubated Ahad Colarusso, Megan Salon 09/22/2012, 3:01 PM

## 2012-09-22 NOTE — Op Note (Signed)
09/18/2012 - 09/22/2012  11:56 AM  PATIENT:  Gerald Leach  79 y.o. male  PRE-OPERATIVE DIAGNOSIS: recurrent subdural hematoma  POST-OPERATIVE DIAGNOSIS:  recurrent subdural hematomahematoma  PROCEDURE:  Procedure(s): Redo right  CRANIOTOMY HEMATOMA EVACUATION SUBDURAL  SURGEON:  Surgeon(s): Clydene Fake, MD  ASSISTANTS:jenkins  ANESTHESIA:   general  EBL:  Total I/O In: 1290 [I.V.:1150; NG/GT:140] Out: 125 [Urine:25; Blood:100]  BLOOD ADMINISTERED:none  DRAINS: (..) Jackson-Pratt drain(s) with closed bulb suction in the .Marland Kitchen   SPECIMEN:  No Specimen  DICTATION: Patient underwent right craniotomy evacuation subdural hematoma Friday patient was extubated done later that day but he said worsening mental status since repeat CT was done showing large recurrent subdural hematoma right with midline shift after discussion family patient is urgently taken back to the operating room for redo craniotomy activation of subdural.  Patient brought in the operating room general anesthesia induced patient prepped draped sterile fashion site of the previous incision the staples were removed prior to prepping draping and incision was opened is a flap was removed by removing the microscrews holding the craniotomy flap. Bone flap was removed and some of her blood was seen and this was evacuated the dura was opened and thick acute subdural blood was then seen and this was evacuated and we irrigated with the stomach solution there is no further clots emergency room for temporal and posteriorly so we extended our incision posterior inferiorly used to craniotome to make a new craniotomy and middle bone flap posterior inferiorly o open the dura the back of the subdural we irrigated with about solution we were finished we did decompression the subdural brain was pulsatile no further bleeding seen. Dura was closed with 4-0 Nurolon interrupted sutures 2 pieces of bone were held together with microplates and screws.  A drain was placed and brought posteriorly through separate stab incision the drain was epidural and subdural more anteriorly. Bone flap was put back into position and held with microplates and screws. We urine about solution and the galea closed with 2-0 Vicryl interrupted sutures skin closed staples sutures placed a drain dressing was then placed patient was then left intubated and transferred to recovery room and to the intensive care and it still intubated under ICU in critical but stable condition  PLAN OF CARE: Admit to inpatient   PATIENT DISPOSITION:  ICU - intubated and critically ill.

## 2012-09-22 NOTE — Progress Notes (Addendum)
Arrived this am to find pt extremely lethargic and not following commands. Responding to name one time but was not able to open eyes and had to constantly be stimulated for a response which was minimal. Discussed with Dr Phoebe Perch and he ordered a head CT. CT showed increase in blood in right subdural space. MD notified and notified pt wife. MD able to discuss findings with his wife and agreed to go back to surgery. Consent form signed and will prepare pt to move to OR. Pt transported to OR at 0946. Will order Ancef 2gm for OR STAT per Dr Phoebe Perch. Dr Molli Knock with CCM asked that pt remain intubated after surgery.

## 2012-09-22 NOTE — Progress Notes (Signed)
Pt reintubated per CCM. Administer fentanyl, 1mg  versed, and total of 40mg  atomidate for intubation. Will await chest xray

## 2012-09-23 ENCOUNTER — Inpatient Hospital Stay (HOSPITAL_COMMUNITY): Payer: Medicare Other

## 2012-09-23 DIAGNOSIS — R1310 Dysphagia, unspecified: Secondary | ICD-10-CM

## 2012-09-23 LAB — BLOOD GAS, ARTERIAL
Acid-base deficit: 1.3 mmol/L (ref 0.0–2.0)
Bicarbonate: 21.9 mEq/L (ref 20.0–24.0)
Bicarbonate: 24.4 mEq/L — ABNORMAL HIGH (ref 20.0–24.0)
FIO2: 45 %
Mode: POSITIVE
O2 Saturation: 99.9 %
PEEP: 5 cmH2O
Patient temperature: 98.6
Patient temperature: 98.6
Pressure support: 5 cmH2O
TCO2: 22.9 mmol/L (ref 0–100)
pCO2 arterial: 34.1 mmHg — ABNORMAL LOW (ref 35.0–45.0)
pH, Arterial: 7.469 — ABNORMAL HIGH (ref 7.350–7.450)
pO2, Arterial: 161 mmHg — ABNORMAL HIGH (ref 80.0–100.0)

## 2012-09-23 LAB — MAGNESIUM: Magnesium: 2.1 mg/dL (ref 1.5–2.5)

## 2012-09-23 LAB — GLUCOSE, CAPILLARY: Glucose-Capillary: 252 mg/dL — ABNORMAL HIGH (ref 70–99)

## 2012-09-23 LAB — BASIC METABOLIC PANEL
CO2: 22 mEq/L (ref 19–32)
Chloride: 102 mEq/L (ref 96–112)
Creatinine, Ser: 0.55 mg/dL (ref 0.50–1.35)
GFR calc Af Amer: >90 mL/min (ref >90)
Potassium: 4.5 mEq/L (ref 3.5–5.1)

## 2012-09-23 LAB — CBC
HCT: 35.2 % — ABNORMAL LOW (ref 39.0–52.0)
MCV: 80.5 fL (ref 78.0–100.0)
Platelets: 347 10*3/uL (ref 150–400)
RBC: 4.37 MIL/uL (ref 4.22–5.81)
RDW: 13.3 % (ref 11.5–15.5)
WBC: 11.2 10*3/uL — ABNORMAL HIGH (ref 4.0–10.5)

## 2012-09-23 MED ORDER — HYDRALAZINE HCL 20 MG/ML IJ SOLN: 20.0000 mg | Freq: Four times a day (QID) | INTRAMUSCULAR | Status: DC | PRN

## 2012-09-23 MED ORDER — PROPOFOL 10 MG/ML IV EMUL
INTRAVENOUS | Status: AC
  Filled 2012-09-23: qty 100

## 2012-09-23 MED ORDER — FUROSEMIDE 10 MG/ML IJ SOLN
40.0000 mg | Freq: Four times a day (QID) | INTRAMUSCULAR | Status: AC
  Administered 2012-09-23 (×3): 40 mg via INTRAVENOUS
  Filled 2012-09-23 (×3): qty 4

## 2012-09-23 MED ORDER — POTASSIUM CHLORIDE 20 MEQ/15ML (10%) PO LIQD
40.0000 meq | Freq: Three times a day (TID) | ORAL | Status: AC
  Administered 2012-09-23 (×2): 40 meq
  Filled 2012-09-23 (×2): qty 30

## 2012-09-23 MED ORDER — PROPOFOL 10 MG/ML IV EMUL
5.0000 ug/kg/min | INTRAVENOUS | Status: DC
  Administered 2012-09-23: 5 ug/kg/min via INTRAVENOUS
  Administered 2012-09-23: 50 ug/kg/min via INTRAVENOUS
  Administered 2012-09-24: 40 ug/kg/min via INTRAVENOUS
  Administered 2012-09-24: 25 ug/kg/min via INTRAVENOUS
  Filled 2012-09-23 (×3): qty 100

## 2012-09-23 NOTE — Progress Notes (Signed)
PT Cancellation Note  Patient Details Name: KALYN DIMATTIA MRN: [id number] DOB: 11/10/1933   Cancelled Treatment:    Reason Eval/Treat Not Completed: Medical issues which prohibited therapy; patient still intubated.  Will attempt another day.   Zalmen Wrightsman,CYNDI 09/23/2012, 11:00 AM

## 2012-09-23 NOTE — Clinical Social Work Psychosocial (Signed)
Clinical Social Work Department BRIEF PSYCHOSOCIAL ASSESSMENT 09/23/2012  Patient:  Gerald Leach, Gerald Leach     Account Number:  [id number]     Admit date:  09/18/2012  Clinical Social Worker:  Peggyann Shoals  Date/Time:  09/23/2012 05:28 PM  Referred by:  Physician  Date Referred:  09/23/2012 Referred for  SNF Placement   Other Referral:   Interview type:  Family Other interview type:    PSYCHOSOCIAL DATA Living Status:  WIFE Admitted from facility:   Level of care:   Primary support name:  Gerald Leach,Gerald Leach/[phone] Primary support relationship to patient:  SPOUSE Degree of support available:   supportive    CURRENT CONCERNS Current Concerns  Post-Acute Placement   Other Concerns:    SOCIAL WORK ASSESSMENT / PLAN CSW met with pt's wife to address consult for SNF. CSW introduced herself and explained role of social work. CSW also explained process of discharge to SNF with [facility]. Awaiting PT/OT evals for discharge recommendations. Pt is currently on vent. CSW provided support to pt's wife. Pt's wife shared that her daughter and son are very supportive.    CSW will continue to follow for discharge recommendations.   Assessment/plan status:  Psychosocial Support/Ongoing Assessment of Needs Other assessment/ plan:   Information/referral to community resources:   SNF list.    PATIENT'S/FAMILY'S RESPONSE TO PLAN OF CARE: Pt is on a vent. Pt's wife was very pleasant and is agreeable to SNF, if it is recommended.    Dede Query, MSW, LCSW [phone]

## 2012-09-23 NOTE — Progress Notes (Signed)
Pt has become increasingly restless and agitated. Versed 2mg  given at 1618; pt still continues to be restless and fight the ventilator.  Andrena Mews, NP on unit; propofol ordered. Will continue to monitor.

## 2012-09-23 NOTE — Clinical Social Work Placement (Signed)
Clinical Social Work Department CLINICAL SOCIAL WORK PLACEMENT NOTE 09/23/2012  Patient:  DANNE, VASEK  Account Number:  [id number] Admit date:  09/18/2012  Clinical Social Worker:  Peggyann Shoals  Date/time:  09/23/2012 07:21 PM  Clinical Social Work is seeking post-discharge placement for this patient at the following level of care:   SKILLED NURSING   (*CSW will update this form in Epic as items are completed)   09/23/2012  Patient/family provided with [facility] Department of Clinical Social Work's list of facilities offering this level of care within the geographic area requested by the patient (or if unable, by the patient's family).  09/23/2012  Patient/family informed of their freedom to choose among providers that offer the needed level of care, that participate in Medicare, Medicaid or managed care program needed by the patient, have an available bed and are willing to accept the patient.  09/23/2012  Patient/family informed of [facility]' ownership interest in [facility], as well as of the fact that they are under no obligation to receive care at this facility.  PASARR submitted to EDS on  PASARR number received from EDS on   FL2 transmitted to all facilities in geographic area requested by pt/family on   FL2 transmitted to all facilities within larger geographic area on   Patient informed that his/her managed care company has contracts with or will negotiate with  certain facilities, including the following:     Patient/family informed of bed offers received:   Patient chooses bed at  Physician recommends and patient chooses bed at    Patient to be transferred to  on   Patient to be transferred to facility by   The following physician request were entered in Epic:   Additional Comments:  Dede Query, MSW, LCSW [phone]

## 2012-09-23 NOTE — Progress Notes (Signed)
Subjective: Patient reports intubated  Objective: Vital signs in last 24 hours: Temp:  [98.2 F (36.8 C)-100 F (37.8 C)] 98.6 F (37 C) (04/08 0400) Pulse Rate:  [46-140] 87 (04/08 0700) Resp:  [0-25] 18 (04/08 0700) BP: (73-169)/(51-105) 169/99 mmHg (04/08 0700) SpO2:  [98 %-100 %] 100 % (04/08 0700) Arterial Line BP: (110-202)/(68-100) 133/83 mmHg (04/08 0700) FiO2 (%):  [45 %-100 %] 45 % (04/08 0700) Weight:  [80.8 kg (178 lb 2.1 oz)] 80.8 kg (178 lb 2.1 oz) (04/08 0500)  Intake/Output from previous day: 04/07 0701 - 04/08 0700 In: 4500 [I.V.:2800; NG/GT:1260; IV Piggyback:410] Out: 1060 [Urine:825; Drains:85; Blood:150] Intake/Output this shift:   Intubated, lightly sedated -   Opens eyes  , FC on right - moves all 4 well Wound:c/d/i  Lab Results:  Recent Labs  09/22/12 0500 09/23/12 0415  WBC 11.7* 11.2*  HGB 14.0 12.9*  HCT 41.3 35.2*  PLT 285 347   BMET  Recent Labs  09/22/12 0500 09/23/12 0415  NA 135 133*  K 3.8 4.5  CL 100 102  CO2 26 22  GLUCOSE 182* 209*  BUN 11 16  CREATININE 0.60 0.55  CALCIUM 8.6 8.3*    Studies/Results: Ct Head Wo Contrast  09/22/2012  *RADIOLOGY REPORT*  Clinical Data: Right subdural hematoma.  More lethargic today.  CT HEAD WITHOUT CONTRAST  Technique:  Contiguous axial images were obtained from the base of the skull through the vertex without contrast.  Comparison: 09/19/2012  Findings: The drain has been removed.  The patient has developed more subdural bleeding over the right cerebral hemisphere particularly superiorly.  There is increased right to left midline shift from 9 mm to 16 mm.  The left lateral ventricle is slightly more dilated than right lateral ventricle is slightly more compressed.  Again noted is the partially calcified suprasellar mass, most likely a partially calcified meningioma.  This also has a mass effect upon the frontal horn of the right lateral ventricle.  This is unchanged.  IMPRESSION:   Recurrent  bleeding in the right subdural space with increased midline shift with further compression of the right lateral ventricle and dilatation of the left lateral ventricle.   Original Report Authenticated By: Francene Boyers, M.D.    Dg Chest Port 1 View  09/22/2012  *RADIOLOGY REPORT*  Clinical Data: Endotracheal tube placement  PORTABLE CHEST - 1 VIEW  Comparison: 09/19/2012  Findings: Endotracheal tube tip is about 3.3 cm above the carina. NG tube again crosses the gastroesophageal junction. Old  posterior- superior right rib fractures again identified.  Lungs are clear. Small left effusion.   No pneumothorax .  IMPRESSION: Endotracheal tube as described above.Small left effusion.   Original Report Authenticated By: Esperanza Heir, M.D.     Assessment/Plan: CPM , appreciate CCM assistance   LOS: 5 days     Kerrilynn Derenzo R, MD 09/23/2012, 7:40 AM

## 2012-09-23 NOTE — Progress Notes (Signed)
Inpatient Diabetes Program Recommendations  AACE/ADA: New Consensus Statement on Inpatient Glycemic Control (2013)  Target Ranges:  Prepandial:   less than 140 mg/dL      Peak postprandial:   less than 180 mg/dL (1-2 hours)      Critically ill patients:  140 - 180 mg/dL     Results for ESIAS, MORY (MRN [id number]) as of 09/23/2012 11:15  Ref. Range 09/21/2012 23:34 09/22/2012 03:13 09/22/2012 07:43 09/22/2012 12:31 09/22/2012 15:43 09/22/2012 20:16  Glucose-Capillary Latest Range: 70-99 mg/dL 409 (H) 811 (H) 914 (H) 129 (H) 176 (H) 252 (H)    Results for CLAIR, ALFIERI (MRN [id number]) as of 09/23/2012 11:15  Ref. Range 09/22/2012 23:50 09/23/2012 08:00  Glucose-Capillary Latest Range: 70-99 mg/dL 086 (H) 578 (H)    Patient currently getting Jevity tube feeds at 70cc/hour.  Having glucose elevations.  MD- Please add tube feed coverage Novolog 3 units Q4 hours (hold if tube feeds held for any reason) Please d/c tube feed coverage once patient extubated  Will follow. Ambrose Finland RN, MSN, CDE Diabetes Coordinator Inpatient Diabetes Program [phone]

## 2012-09-23 NOTE — Progress Notes (Signed)
PULMONARY  / CRITICAL CARE MEDICINE  Name: Gerald Leach MRN: [id number] DOB: 08-11-1933    ADMISSION DATE:  09/18/2012 CONSULTATION DATE: 09/23/2012   REFERRING MD :  Dr Phoebe Perch PRIMARY SERVICE: Neurosurgery   CHIEF COMPLAINT:  Acute encephalopathy  BRIEF PATIENT DESCRIPTION: 79 year old man, with past medical history of hypertension, and hypothyroidism, admitted with a large right subdural hematoma. Has acute respiratory failure post subdural evacuation along with severe hypertension.   SIGNIFICANT EVENTS / STUDIES:  Head CT scan-right large subdural hematoma with 1.44 cm midline shift. 4/4 > Craniotomy/hematoma evacuation 4/4 extubation postop  LINES / TUBES: 09/19/2012 - ETT.>>4/4   CULTURES: None  ANTIBIOTICS: IV Ancef periop   SUBJECTIVE:   Confused, agitated at times, not protecting airway, getting TF  VITAL SIGNS: Temp:  [98.2 F (36.8 C)-100 F (37.8 C)] 98.6 F (37 C) (04/08 0820) Pulse Rate:  [46-140] 92 (04/08 0900) Resp:  [0-25] 18 (04/08 0900) BP: (73-169)/(51-105) 137/82 mmHg (04/08 0900) SpO2:  [99 %-100 %] 100 % (04/08 0900) Arterial Line BP: (110-202)/(68-100) 130/81 mmHg (04/08 0900) FiO2 (%):  [40 %-100 %] 40 % (04/08 0820) Weight:  [80.8 kg (178 lb 2.1 oz)] 80.8 kg (178 lb 2.1 oz) (04/08 0500) HEMODYNAMICS: CV stable   VENTILATOR SETTINGS: Extubated to RA 4/4.  4/5 on RA sats 98-100% Vent Mode:  [-] PSV FiO2 (%):  [40 %-100 %] 40 % Set Rate:  [18 bmp] 18 bmp Vt Set:  [640 mL] 640 mL PEEP:  [5 cmH20] 5 cmH20 Pressure Support:  [5 cmH20] 5 cmH20 Plateau Pressure:  [15 cmH20] 15 cmH20 INTAKE / OUTPUT: Intake/Output     04/07 0701 - 04/08 0700 04/08 0701 - 04/09 0700   I.V. (mL/kg) 2800 (34.7) 150 (1.9)   Other 30    NG/GT 1260 140   IV Piggyback 410    Total Intake(mL/kg) 4500 (55.7) 290 (3.6)   Urine (mL/kg/hr) 825 (0.4)    Drains 85 (0) 8 (0)   Blood 150 (0.1)    Total Output 1060 8   Net +3440 +282        Urine Occurrence 4 x       PHYSICAL EXAMINATION: General: Lethargic, no distress, tracks and follows simple commands. Neuro: Lethargic, will not f/c.  None focal. HEENT: Right craniotomy without active bleeding. NGT in place. Cardiovascular:  Heart sounds WNL, regular and normal.  No added sounds. No murmurs. Lungs: Very rhonchus no accessory muscle use. Abdomen: Nondistended, nontender, normal bowel sounds. No palpable masses. Musculoskeletal: Unremarkable  Skin: No rashes.  Recent Labs Lab 09/21/12 0455 09/22/12 0500 09/23/12 0415  NA 134* 135 133*  K 3.5 3.8 4.5  CL 100 100 102  CO2 22 26 22   BUN 9 11 16   CREATININE 0.59 0.60 0.55  GLUCOSE 163* 182* 209*   Recent Labs Lab 09/21/12 0455 09/22/12 0500 09/23/12 0415  HGB 14.6 14.0 12.9*  HCT 41.9 41.3 35.2*  WBC 13.6* 11.7* 11.2*  PLT 278 285 347    Recent Labs Lab 09/22/12 1231 09/22/12 1543 09/22/12 2016 09/22/12 2350 09/23/12 0800  GLUCAP 129* 176* 252* 222* 201*    CXR No film 4/6  ASSESSMENT / PLAN: Principal Problem:   Subdural hematoma Active Problems:   HYPOTHYROIDISM   HYPERTENSION   Diabetes mellitus type II, uncontrolled   Respiratory failure, post-operative   S/P craniotomy   Dysphagia   NEUROLOGIC A:   SDH, s/p craniotomy and hematoma evacuation    P:   - Per NSGY,  back to the OR 4/7, now more interactive. - Continue with Keppra infusion as per neurosurgery . - Minimize sedation.  PULMONARY A:  Post surgery respiratory failure. S/p extubation 4/4, reintubated for OR visit on 4/7. P:   - Begin PS trials, would like to diurese more aggressively and have mental status a little more stable prior to extubation. - TF. - F/U CXR and ABG.  CARDIOVASCULAR A:HTN (better control as of 4/5 and 4/6) P:  - IV Hydralazine given bradycardia.  - Goal SBP < 160 per neurosurgery - If remains elevated post diureses then will consider PO anti-HTN.  RENAL: No acute issues. Forming urine.  P:   - Lasix 40 mg IV q6  x3. - KCl PO x2 doses. - BMET daily.  GASTROINTESTINAL A:  Failed swallow eval post extubation.  Not able to protect airway P:   - Cont tube feeds.  HEMATOLOGIC A: No acute issues. No active bleeding. Drain with 25 cc of blood P:  - CBC in AM. - SCDs for DVT proph, cannot get hep/lmwh.  INFECTIOUS A: No active issues, mild leukocytosis P:   - No intervention. - CBC in AM.  ENDOCRINE A:  Mild hyperglycemia, hypothyroidism   P:   - SSI. - Continue with synthroid.  TODAY'S SUMMARY: 79 year old man, with past medical history of hypertension, and hypothyroidism, admitted with a large right subdural hematoma. Had acute respiratory failure post subdural evacuation along with severe hypertension post op now better 09/20/12. Now extubated 4/4.  Reintubated on 4/7, will diurese today and take another attempt at extubation prior to extubation  I have personally obtained a history, examined the patient, evaluated laboratory and imaging results, formulated the assessment and plan and placed orders.  CRITICAL CARE: The patient is critically ill with multiple organ systems failure and requires high complexity decision making for assessment and support, frequent evaluation and titration of therapies, application of advanced monitoring technologies and extensive interpretation of multiple databases. Critical Care Time devoted to patient care services described in this note is 35 minutes.   Alyson Reedy, M.D. [facility] Pulmonary/Critical Care Medicine. Pager: [phone]. After hours pager: [phone].

## 2012-09-23 NOTE — Progress Notes (Signed)
SLP Cancellation Note  Patient Details Name: Gerald Leach MRN: [id number] DOB: [birth year]   Cancelled treatment:       Reason Eval/Treat Not Completed: Medical issues which prohibited therapy;Patient not medically ready;Other (comment) (Pt orally intubated. Will continue efforts.)  Celia B. Murvin Natal [facility], CCC-SLP [phone] Leigh Aurora 09/23/2012, 10:32 AM

## 2012-09-23 NOTE — Progress Notes (Signed)
UR completed 

## 2012-09-24 ENCOUNTER — Inpatient Hospital Stay (HOSPITAL_COMMUNITY): Payer: Medicare Other

## 2012-09-24 ENCOUNTER — Encounter (HOSPITAL_COMMUNITY): Payer: Self-pay | Admitting: Neurosurgery

## 2012-09-24 LAB — BLOOD GAS, ARTERIAL
Acid-Base Excess: 2.6 mmol/L — ABNORMAL HIGH (ref 0.0–2.0)
Drawn by: 36277
Drawn by: 222511
FIO2: 0.4 %
MECHVT: 600 mL
O2 Saturation: 99.1 %
O2 Saturation: 99.6 %
PEEP: 5 cmH2O
Patient temperature: 98.6
RATE: 18 resp/min
RATE: 10 resp/min
pCO2 arterial: 24.8 mmHg — ABNORMAL LOW (ref 35.0–45.0)
pCO2 arterial: 32.8 mmHg — ABNORMAL LOW (ref 35.0–45.0)
pO2, Arterial: 129 mmHg — ABNORMAL HIGH (ref 80.0–100.0)

## 2012-09-24 LAB — GLUCOSE, CAPILLARY
Glucose-Capillary: 232 mg/dL — ABNORMAL HIGH (ref 70–99)
Glucose-Capillary: 128 mg/dL — ABNORMAL HIGH (ref 70–99)
Glucose-Capillary: 130 mg/dL — ABNORMAL HIGH (ref 70–99)
Glucose-Capillary: 115 mg/dL — ABNORMAL HIGH (ref 70–99)

## 2012-09-24 LAB — CBC
HCT: 40.5 % (ref 39.0–52.0)
MCHC: 34.3 g/dL (ref 30.0–36.0)
MCV: 79.6 fL (ref 78.0–100.0)
RDW: 13.7 % (ref 11.5–15.5)

## 2012-09-24 LAB — BASIC METABOLIC PANEL
BUN: 20 mg/dL (ref 6–23)
Calcium: 9.1 mg/dL (ref 8.4–10.5)
Creatinine, Ser: 0.71 mg/dL (ref 0.50–1.35)
GFR calc Af Amer: >90 mL/min (ref >90)
GFR calc non Af Amer: 89 mL/min — ABNORMAL LOW (ref >90)

## 2012-09-24 MED ORDER — POTASSIUM CHLORIDE 20 MEQ/15ML (10%) PO LIQD
40.0000 meq | Freq: Three times a day (TID) | ORAL | Status: DC
  Filled 2012-09-24 (×2): qty 30

## 2012-09-24 MED ORDER — FUROSEMIDE 10 MG/ML IJ SOLN
40.0000 mg | Freq: Four times a day (QID) | INTRAMUSCULAR | Status: AC
  Administered 2012-09-24 (×3): 40 mg via INTRAVENOUS
  Filled 2012-09-24 (×3): qty 4

## 2012-09-24 MED ORDER — POTASSIUM CHLORIDE 10 MEQ/100ML IV SOLN
10.0000 meq | INTRAVENOUS | Status: AC
  Administered 2012-09-24 (×4): 10 meq via INTRAVENOUS
  Filled 2012-09-24: qty 300
  Filled 2012-09-24: qty 100

## 2012-09-24 MED ORDER — PANTOPRAZOLE SODIUM 40 MG PO PACK
40.0000 mg | PACK | Freq: Every day | ORAL | Status: DC
  Administered 2012-09-26 – 2012-09-30 (×6): 40 mg
  Filled 2012-09-24 (×7): qty 20

## 2012-09-24 NOTE — Progress Notes (Signed)
PULMONARY  / CRITICAL CARE MEDICINE  Name: Gerald Leach MRN: [id number] DOB: 13-Oct-1933    ADMISSION DATE:  09/18/2012 CONSULTATION DATE: 09/24/2012   REFERRING MD :  Dr Phoebe Perch PRIMARY SERVICE: Neurosurgery   CHIEF COMPLAINT:  Acute encephalopathy  BRIEF PATIENT DESCRIPTION: 79 year old man, with past medical history of hypertension, and hypothyroidism, admitted with a large right subdural hematoma. Has acute respiratory failure post subdural evacuation along with severe hypertension.   SIGNIFICANT EVENTS / STUDIES:  Head CT scan-right large subdural hematoma with 1.44 cm midline shift. 4/4 > Craniotomy/hematoma evacuation 4/4 extubation postop  LINES / TUBES: 09/19/2012 - ETT.>>4/4   CULTURES: None  ANTIBIOTICS: IV Ancef periop   SUBJECTIVE:   Confused, agitated at times, not protecting airway, getting TF  VITAL SIGNS: Temp:  [97.5 F (36.4 C)-99.1 F (37.3 C)] 97.7 F (36.5 C) (04/09 0700) Pulse Rate:  [60-105] 103 (04/09 0832) Resp:  [12-23] 13 (04/09 0832) BP: (85-170)/(51-100) 115/62 mmHg (04/09 0832) SpO2:  [98 %-100 %] 100 % (04/09 0832) FiO2 (%):  [40 %] 40 % (04/09 0832) Weight:  [83.4 kg (183 lb 13.8 oz)] 83.4 kg (183 lb 13.8 oz) (04/09 0500) HEMODYNAMICS: CV stable   VENTILATOR SETTINGS: Extubated to RA 4/4.  4/5 on RA sats 98-100% Vent Mode:  [-] PRVC FiO2 (%):  [40 %] 40 % Set Rate:  [10 bmp-18 bmp] 10 bmp Vt Set:  [600 mL-640 mL] 600 mL PEEP:  [5 cmH20] 5 cmH20 Pressure Support:  [5 cmH20] 5 cmH20 Plateau Pressure:  [13 cmH20-20 cmH20] 13 cmH20 INTAKE / OUTPUT: Intake/Output     04/08 0701 - 04/09 0700 04/09 0701 - 04/10 0700   I.V. (mL/kg) 572.3 (6.9) 26.5 (0.3)   Other     NG/GT 140    IV Piggyback 410    Total Intake(mL/kg) 1122.3 (13.5) 26.5 (0.3)   Urine (mL/kg/hr) 3450 (1.7)    Drains 68 (0)    Blood     Total Output 3518     Net -2395.7 +26.5        Urine Occurrence 1 x    Stool Occurrence 1 x      PHYSICAL  EXAMINATION: General: Lethargic, no distress, tracks and follows simple commands. Neuro: Lethargic, will not f/c.  None focal. HEENT: Right craniotomy without active bleeding. NGT in place. Cardiovascular:  Heart sounds WNL, regular and normal.  No added sounds. No murmurs. Lungs: Very rhonchus no accessory muscle use. Abdomen: Nondistended, nontender, normal bowel sounds. No palpable masses. Musculoskeletal: Unremarkable  Skin: No rashes.  Recent Labs Lab 09/22/12 0500 09/23/12 0415 09/24/12 0515  NA 135 133* 136  K 3.8 4.5 3.9  CL 100 102 96  CO2 26 22 25   BUN 11 16 20   CREATININE 0.60 0.55 0.71  GLUCOSE 182* 209* 144*    Recent Labs Lab 09/22/12 0500 09/23/12 0415 09/24/12 0515  HGB 14.0 12.9* 13.9  HCT 41.3 35.2* 40.5  WBC 11.7* 11.2* 11.3*  PLT 285 347 354    Recent Labs Lab 09/22/12 2350 09/23/12 0800 09/23/12 1556 09/23/12 1922 09/23/12 2359  GLUCAP 222* 201* 108* 144* 108*    CXR No film 4/6  ASSESSMENT / PLAN: Principal Problem:   Subdural hematoma Active Problems:   HYPOTHYROIDISM   HYPERTENSION   Diabetes mellitus type II, uncontrolled   Respiratory failure, post-operative   S/P craniotomy   Dysphagia  NEUROLOGIC A:   SDH, s/p craniotomy and hematoma evacuation    P:   - Per NSGY, back  to the OR 4/7, now more interactive. - Continue with Keppra infusion as per neurosurgery . - Minimize sedation.  PULMONARY A:  Post surgery respiratory failure. S/p extubation 4/4, reintubated for OR visit on 4/7. P:   - SBT to extubate today, if patient decompensates and requires reintubation then will reintubate and trach.  Will not extubate until mental status is improved, will need discussion with the family regarding progression of plan of care, will likely need a tracheostomy. - TF. - F/U CXR and ABG.  CARDIOVASCULAR A:HTN (better control as of 4/5 and 4/6) P:  - IV Hydralazine given bradycardia.  - Goal SBP < 160 per neurosurgery - If  remains elevated post diureses then will consider PO anti-HTN.  RENAL: No acute issues. Forming urine.  P:   - Lasix 40 mg IV q6 x3. - KCl PO x2 doses. - BMET daily.  GASTROINTESTINAL A:  Failed swallow eval post extubation.  Not able to protect airway P:   - Cont tube feeds.  HEMATOLOGIC A: No acute issues. No active bleeding. Drain with 25 cc of blood P:  - CBC in AM. - SCDs for DVT proph, cannot get hep/lmwh.  INFECTIOUS A: No active issues, mild leukocytosis P:   - No intervention. - CBC in AM.  ENDOCRINE A:  Mild hyperglycemia, hypothyroidism   P:   - SSI. - Continue with synthroid.  TODAY'S SUMMARY: 79 year old man, with past medical history of hypertension, and hypothyroidism, admitted with a large right subdural hematoma. Had acute respiratory failure post subdural evacuation along with severe hypertension post op now better 09/20/12. Now extubated 4/4.  Reintubated on 4/7, diuresed, will SBT to extubate, if fails then will reintubate and trach.  I have personally obtained a history, examined the patient, evaluated laboratory and imaging results, formulated the assessment and plan and placed orders.  CRITICAL CARE: The patient is critically ill with multiple organ systems failure and requires high complexity decision making for assessment and support, frequent evaluation and titration of therapies, application of advanced monitoring technologies and extensive interpretation of multiple databases. Critical Care Time devoted to patient care services described in this note is 35 minutes.   Alyson Reedy, M.D. [facility] Pulmonary/Critical Care Medicine. Pager: [phone]. After hours pager: [phone].

## 2012-09-24 NOTE — Progress Notes (Signed)
PT Cancellation Note  Patient Details Name: Gerald Leach MRN: [id number] DOB: 1933-06-24   Cancelled Treatment:    Reason Eval/Treat Not Completed: Patient not medically ready; attempted earlier today and pt still intubated.  Noted extubated this pm.  Will attempt tomorrow.   Landis Cassaro,CYNDI 09/24/2012, 3:58 PM

## 2012-09-24 NOTE — Procedures (Signed)
Extubation Procedure Note  Patient Details:   Name: Gerald Leach DOB: 06-Apr-1934 MRN: [id number]   Pt extubated on trial basis after successful SBT per MD order.  Pt tolerated well. + cuff leak noted; pt is able to vocalize but won't cough on demand.    Evaluation  O2 sats: stable throughout Complications: No apparent complications Patient did tolerate procedure well. Bilateral Breath Sounds: Clear Suctioning: Airway Yes  Aurea Aronov Apple 09/24/2012, 11:41 AM

## 2012-09-24 NOTE — Progress Notes (Signed)
Vent changes made per Dr. Lynelle Doctor orders

## 2012-09-24 NOTE — Progress Notes (Signed)
NUTRITION FOLLOW UP  Intervention:   1. If pt unable to pass swallow evaluation recommend Initiate Jevity 1.2 @ 20 ml/hr and increase by 10 ml every 4 hours to goal rate of 70 ml/hr. At goal rate, tube feeding regimen will provide 2016 kcal, 93 grams of protein, and 1355 ml of H2O.   2. Recommend 170 ml H2O flush every 6 hours (provides 680 ml free water, total free water 2035 ml)  3. If able to start PO diet will supplement as appropriate, await SLP evaluation  Nutrition Dx:   Inadequate oral intake related to inability to eat as evidenced by NPO status; ongoing.   Goal:   Pt to meet >/= 90% of their estimated nutrition needs; not met.   Monitor:   Swallow eval, weight trends, labs  Assessment:   Pt transferred from [facility] with large right SDH. S/P craniotomy with subdural evacuation. Pt extubated 4/4 but re-intubated 4/7 and extubated 4/9. Swallow evaluation held today   Height: Ht Readings from Last 1 Encounters:  09/22/12 6\' 1"  (1.854 m)    Weight Status:   Wt Readings from Last 1 Encounters:  09/24/12 183 lb 13.8 oz (83.4 kg)  Admission weight: 180 lb   Re-estimated needs:  Kcal: 1900-2100  Protein: 80-90 gm  Fluid: 1.9-2.1 L   Skin: incisions  Diet Order: NPO   Intake/Output Summary (Last 24 hours) at 09/24/12 0951 Last data filed at 09/24/12 0900  Gross per 24 hour  Intake  867.9 ml  Output   3735 ml  Net -2867.1 ml    Last BM: 4/8   Labs:   Recent Labs Lab 09/20/12 0440  09/22/12 0500 09/23/12 0415 09/24/12 0515  NA 134*  < > 135 133* 136  K 4.2  < > 3.8 4.5 3.9  CL 99  < > 100 102 96  CO2 24  < > 26 22 25   BUN 8  < > 11 16 20   CREATININE 0.71  < > 0.60 0.55 0.71  CALCIUM 9.2  < > 8.6 8.3* 9.1  MG 2.2  --   --  2.1 2.3  PHOS 3.0  --   --  2.9 3.1  GLUCOSE 134*  < > 182* 209* 144*  < > = values in this interval not displayed.  CBG (last 3)   Recent Labs  09/23/12 1556 09/23/12 1922 09/23/12 2359  GLUCAP 108* 144* 108*    Scheduled  Meds: . antiseptic oral rinse  15 mL Mouth Rinse QID  . atenolol  12.5 mg Per Tube Daily  .  ceFAZolin (ANCEF) IV  2 g Intravenous Q8H  . chlorhexidine  15 mL Mouth Rinse BID  . insulin aspart  0-15 Units Subcutaneous Q4H  . levETIRAcetam  500 mg Intravenous BID  . levothyroxine  50 mcg Per Tube QAC breakfast  . pantoprazole sodium  40 mg Per Tube Daily  . simvastatin  10 mg Per Tube QHS    Continuous Infusions: . feeding supplement (JEVITY 1.2 CAL) 1,000 mL (09/23/12 1700)  . propofol Stopped (09/24/12 0845)    Kendell Bane RD, LDN, CNSC [phone] Pager [phone] After Hours Pager

## 2012-09-24 NOTE — Progress Notes (Signed)
eLink Physician-Brief Progress Note Patient Name: TYJAY GALINDO DOB: [birth year] MRN: [id number]  Date of Service  09/24/2012   HPI/Events of Note   No SUP  eICU Interventions  PPI ordered for SUP   Intervention Category Intermediate Interventions: Best-practice therapies (e.g. DVT, beta blocker, etc.)  Shan Levans 09/24/2012, 12:16 AM

## 2012-09-24 NOTE — Progress Notes (Signed)
SLP Cancellation Note  Patient Details Name: Gerald Leach MRN: [id number] DOB: 1933-11-12   Cancelled treatment:       Reason Eval/Treat Not Completed: Medical issues which prohibited therapy (intubated. will f/u 4/10. )  Ferdinand Lango MA, CCC-SLP [phone]  [name] 09/24/2012, 10:02 AM

## 2012-09-24 NOTE — Progress Notes (Signed)
eLink Physician-Brief Progress Note Patient Name: Gerald Leach DOB: 1933/10/06 MRN: [id number]  Date of Service  09/24/2012   HPI/Events of Note   Vent set at high Ve ? Unclear reason, leading to resp alkalosis  eICU Interventions  Adjust Ve down and repeat ABG    Intervention Category Major Interventions: Acid-Base disturbance - evaluation and management;Respiratory failure - evaluation and management  Shan Levans 09/24/2012, 5:31 AM

## 2012-09-24 NOTE — Progress Notes (Signed)
Subjective: Patient reports sedated, intubated  Objective: Vital signs in last 24 hours: Temp:  [97.5 F (36.4 C)-99.1 F (37.3 C)] 97.7 F (36.5 C) (04/09 0700) Pulse Rate:  [60-105] 99 (04/09 0700) Resp:  [12-23] 14 (04/09 0700) BP: (85-170)/(51-100) 102/67 mmHg (04/09 0700) SpO2:  [98 %-100 %] 100 % (04/09 0700) Arterial Line BP: (130)/(81) 130/81 mmHg (04/08 0900) FiO2 (%):  [40 %] 40 % (04/09 0543) Weight:  [83.4 kg (183 lb 13.8 oz)] 83.4 kg (183 lb 13.8 oz) (04/09 0500)  Intake/Output from previous day: 04/08 0701 - 04/09 0700 In: 1122.3 [I.V.:572.3; NG/GT:140; IV Piggyback:410] Out: 3518 [Urine:3450; Drains:68] Intake/Output this shift:    sedated, intubated  -  when sedation less  - moves all 4 and follows some commands, pulling all tubes  Lab Results:  Recent Labs  09/23/12 0415 09/24/12 0515  WBC 11.2* 11.3*  HGB 12.9* 13.9  HCT 35.2* 40.5  PLT 347 354   BMET  Recent Labs  09/23/12 0415 09/24/12 0515  NA 133* 136  K 4.5 3.9  CL 102 96  CO2 22 25  GLUCOSE 209* 144*  BUN 16 20  CREATININE 0.55 0.71  CALCIUM 8.3* 9.1    Studies/Results: Ct Head Wo Contrast  09/22/2012  *RADIOLOGY REPORT*  Clinical Data: Right subdural hematoma.  More lethargic today.  CT HEAD WITHOUT CONTRAST  Technique:  Contiguous axial images were obtained from the base of the skull through the vertex without contrast.  Comparison: 09/19/2012  Findings: The drain has been removed.  The patient has developed more subdural bleeding over the right cerebral hemisphere particularly superiorly.  There is increased right to left midline shift from 9 mm to 16 mm.  The left lateral ventricle is slightly more dilated than right lateral ventricle is slightly more compressed.  Again noted is the partially calcified suprasellar mass, most likely a partially calcified meningioma.  This also has a mass effect upon the frontal horn of the right lateral ventricle.  This is unchanged.  IMPRESSION:    Recurrent bleeding in the right subdural space with increased midline shift with further compression of the right lateral ventricle and dilatation of the left lateral ventricle.   Original Report Authenticated By: Francene Boyers, M.D.    Dg Chest Port 1 View  09/23/2012  *RADIOLOGY REPORT*  Clinical Data: Evaluate endotracheal tube position.  PORTABLE CHEST - 1 VIEW  Comparison: Chest x-ray 09/19/2012.  Findings: An endotracheal tube is in place with tip 3.1 cm above the carina. Lung volumes are low.  Minimal bibasilar opacities are most compatible with subsegmental atelectasis.  No definite consolidative airspace disease.  Possible trace left pleural effusion.  No evidence of pulmonary edema.  Heart size is within normal limits. The patient is rotated to the right on today's exam, resulting in distortion of the mediastinal contours and reduced diagnostic sensitivity and specificity for mediastinal pathology. Atherosclerosis in the thoracic aorta. Multiple old healed posterior rib fractures bilaterally.  IMPRESSION: 1.  Tip of endotracheal tube is 3.1 cm above the carina. 2.  Low lung volumes with bibasilar subsegmental atelectasis and small left pleural effusion. 3.  Atherosclerosis.   Original Report Authenticated By: Trudie Reed, M.D.    Dg Chest Port 1 View  09/22/2012  *RADIOLOGY REPORT*  Clinical Data: Endotracheal tube placement  PORTABLE CHEST - 1 VIEW  Comparison: 09/19/2012  Findings: Endotracheal tube tip is about 3.3 cm above the carina. NG tube again crosses the gastroesophageal junction. Old  posterior- superior right rib  fractures again identified.  Lungs are clear. Small left effusion.   No pneumothorax .  IMPRESSION: Endotracheal tube as described above.Small left effusion.   Original Report Authenticated By: Esperanza Heir, M.D.     Assessment/Plan: Ok to wean sedation and wean vent and extubate per CCM -    LOS: 6 days     Jaymari Cromie R, MD 09/24/2012, 8:10 AM

## 2012-09-25 ENCOUNTER — Inpatient Hospital Stay (HOSPITAL_COMMUNITY): Payer: Medicare Other

## 2012-09-25 LAB — BASIC METABOLIC PANEL
BUN: 26 mg/dL — ABNORMAL HIGH (ref 6–23)
CO2: 27 mEq/L (ref 19–32)
Chloride: 96 mEq/L (ref 96–112)
Creatinine, Ser: 0.73 mg/dL (ref 0.50–1.35)
GFR calc Af Amer: >90 mL/min (ref >90)
Potassium: 3.5 mEq/L (ref 3.5–5.1)

## 2012-09-25 LAB — CBC
HCT: 40.6 % (ref 39.0–52.0)
Hemoglobin: 13.8 g/dL (ref 13.0–17.0)
MCV: 81.4 fL (ref 78.0–100.0)
RBC: 4.99 MIL/uL (ref 4.22–5.81)
RDW: 13.6 % (ref 11.5–15.5)
WBC: 9.7 10*3/uL (ref 4.0–10.5)

## 2012-09-25 LAB — GLUCOSE, CAPILLARY
Glucose-Capillary: 124 mg/dL — ABNORMAL HIGH (ref 70–99)
Glucose-Capillary: 115 mg/dL — ABNORMAL HIGH (ref 70–99)
Glucose-Capillary: 112 mg/dL — ABNORMAL HIGH (ref 70–99)

## 2012-09-25 LAB — MAGNESIUM: Magnesium: 2.5 mg/dL (ref 1.5–2.5)

## 2012-09-25 MED ORDER — FUROSEMIDE 10 MG/ML IJ SOLN
40.0000 mg | Freq: Three times a day (TID) | INTRAMUSCULAR | Status: AC
  Administered 2012-09-25 (×2): 40 mg via INTRAVENOUS
  Filled 2012-09-25 (×2): qty 4

## 2012-09-25 MED ORDER — POTASSIUM CHLORIDE 10 MEQ/100ML IV SOLN
10.0000 meq | INTRAVENOUS | Status: AC
  Administered 2012-09-25 (×6): 10 meq via INTRAVENOUS
  Filled 2012-09-25: qty 100
  Filled 2012-09-25: qty 500

## 2012-09-25 MED ORDER — POTASSIUM CHLORIDE 20 MEQ/15ML (10%) PO LIQD
40.0000 meq | Freq: Three times a day (TID) | ORAL | Status: DC
  Filled 2012-09-25 (×2): qty 30

## 2012-09-25 NOTE — Progress Notes (Signed)
Pt unable to start diet today per SLP evaluation. MD made aware. Order received to place panda tube and resume TF at half previous rate and advance as tolerated 10 mL/hr q4 hour until goal rate is met.   Gerald Leach

## 2012-09-25 NOTE — Progress Notes (Signed)
SLP reviewed and agree with student findings.   Kert Shackett MA, CCC-SLP [phone]    

## 2012-09-25 NOTE — Progress Notes (Signed)
Speech Language Pathology Dysphagia Treatment Patient Details Name: Gerald Leach MRN: [id number] DOB: 30-Jun-1933 Today's Date: 09/25/2012 Time: 1010-1026 SLP Time Calculation (min): 16 min  Assessment / Plan / Recommendation Clinical Impression  Treatment focused on skilled observation of therapeutic po trials for possible diet advancement. SLP provided trials of ice chips and puree. Pt with no overt s/s of aspiration, however L anterior loss of bolus on all trials with no attempts by pt to clear bolus and pt with waning LOA was observed. Pt requires max verbal and tactile cues for participation and maintenance of LOA in tx, SLP recommends pt remain NPO until he is able to sustain an increased LOA with f/u in a.m. for po trials for possible diet advancement.     Diet Recommendation  Continue with Current Diet: NPO    SLP Plan Continue with current plan of care   Pertinent Vitals/Pain None reported   Swallowing Goals  SLP Swallowing Goals Goal #3: Pt will sustain attention to trials of thin liquids and solids with timley oral transit and swallow response with min verbal cues.  Swallow Study Goal #3 - Progress: Progressing toward goal  General Temperature Spikes Noted: No Respiratory Status: Supplemental O2 delivered via (comment) (nasal cannula) Behavior/Cognition: Impulsive;Lethargic;Distractible;Requires cueing Oral Cavity - Dentition: Adequate natural dentition Patient Positioning: Upright in bed  Oral Cavity - Oral Hygiene     Dysphagia Treatment Treatment focused on: Upgraded PO texture trials Treatment Methods/Modalities: Skilled observation;Differential diagnosis Patient observed directly with PO's: Yes Type of PO's observed: Ice chips;Dysphagia 1 (puree) Feeding: Total assist Liquids provided via: Teaspoon Oral Phase Signs & Symptoms: Anterior loss/spillage;Prolonged mastication;Prolonged oral phase;Left pocketing Type of cueing: Verbal;Tactile Amount of cueing:  Maximal   GO    Berdine Dance SLP student Berdine Dance 09/25/2012, 12:20 PM

## 2012-09-25 NOTE — Progress Notes (Signed)
Subjective: Patient reports no c/o  Objective: Vital signs in last 24 hours: Temp:  [97.5 F (36.4 C)-98.3 F (36.8 C)] 98 F (36.7 C) (04/10 0800) Pulse Rate:  [58-94] 58 (04/10 0800) Resp:  [8-23] 18 (04/10 0800) BP: (103-136)/(49-91) 135/80 mmHg (04/10 0800) SpO2:  [97 %-100 %] 100 % (04/10 0800) FiO2 (%):  [40 %] 40 % (04/09 1100) Weight:  [78.1 kg (172 lb 2.9 oz)] 78.1 kg (172 lb 2.9 oz) (04/10 0500)  Intake/Output from previous day: 04/09 0701 - 04/10 0700 In: 795.6 [I.V.:35.6; IV Piggyback:760] Out: 2190 [Urine:2125; Drains:65] Intake/Output this shift:    extubated  - arouses Ox2+  - FC all 4, little slowly on left  - may have slight left weakness -     Lab Results:  Recent Labs  09/24/12 0515 09/25/12 0335  WBC 11.3* 9.7  HGB 13.9 13.8  HCT 40.5 40.6  PLT 354 347   BMET  Recent Labs  09/24/12 0515 09/25/12 0335  NA 136 135  K 3.9 3.5  CL 96 96  CO2 25 27  GLUCOSE 144* 132*  BUN 20 26*  CREATININE 0.71 0.73  CALCIUM 9.1 9.0    Studies/Results: Dg Chest Port 1 View  09/25/2012  *RADIOLOGY REPORT*  Clinical Data: Evaluate endotracheal tube placement.  PORTABLE CHEST - 1 VIEW  Comparison: Chest x-ray 09/24/2012.  Findings: Previously noted endotracheal tube has been removed. Lung volumes are low.  There are patchy interstitial opacities throughout the entire left lung.  Right lung appears clear. Pulmonary vasculature is within normal limits allowing for low lung volumes.  Blunting of the left costophrenic sulcus may suggest a small left pleural effusion.  Heart size is within normal limits. The patient is rotated to the left on today's exam, resulting in distortion of the mediastinal contours and reduced diagnostic sensitivity and specificity for mediastinal pathology. Atherosclerosis in the thoracic aorta.  Multiple old healed right- sided upper posterior rib fractures are incidentally noted.  IMPRESSION: 1.  The patient has been extubated. 2.  Increased  asymmetric interstitial prominence throughout the entire left lung is unusual, and most concerning for acute infection.  There is also likely a small left-sided pleural effusion. Attention on follow-up studies is recommended. 3.  Atherosclerosis.   Original Report Authenticated By: Trudie Reed, M.D.    Dg Chest Port 1 View  09/24/2012  *RADIOLOGY REPORT*  Clinical Data: Endotracheal tube placement  PORTABLE CHEST - 1 VIEW  Comparison: 09/23/2012; 09/22/2012  Findings:  Examination is degraded secondary to the kyphotic projection and overlying support apparatus.  Grossly unchanged cardiac silhouette and mediastinal contours. Stable positioning of support apparatus.  No definite pneumothorax given patient projection.  Grossly unchanged left basilar heterogeneous opacities. No definite evidence of edema.  Trace left- sided effusion is not excluded.  Unchanged bones including multiple old/healed bilateral superior posterior rib fractures.  IMPRESSION: 1. Degraded examination secondary to kyphotic projection. 2.  Stable positioning of support apparatus.  No pneumothorax. 3.  Grossly unchanged left basilar heterogeneous opacities worrisome for developing infection.  Continued attention on follow- up is recommended.   Original Report Authenticated By: Tacey Ruiz, MD     Assessment/Plan: Pt much improved  -  Start Tx's, increase activity  - drain removed  - watch in ICU at least 1 more day   LOS: 7 days     Garv Kuechle R, MD 09/25/2012, 9:22 AM

## 2012-09-25 NOTE — Progress Notes (Addendum)
After 1st KUB taken after NGT placed, tube not far enough into stomach for use per MD. tube pulled back and then advanced. After 2nd KUB, spoke with MD, tube still not in correct placement for use. Tube not used. Pt will continue to remain NPO until correct placement obtained for pills/feedings. Per tube Potassium changed to IV route.    Holly Bodily

## 2012-09-25 NOTE — Progress Notes (Signed)
Physical Therapy Treatment Patient Details Name: Gerald Leach MRN: [id number] DOB: 1933/12/22 Today's Date: 09/25/2012 Time: 4742-5956 PT Time Calculation (min): 40 min  PT Assessment / Plan / Recommendation Comments on Treatment Session  Patient is a 79 y/o s/p craniotomy x 2 for SDH evacuation.  He demonstrates many functional improvements and clearer cognition this session compared to evaluation.  He remains high fall risk with left weakness and decreased awareness.  May benefit from rehab consult for CIR consideration.      Follow Up Recommendations  CIR     Does the patient have the potential to tolerate intense rehabilitation   yes  Barriers to Discharge        Equipment Recommendations  Other (comment) (TBA)    Recommendations for Other Services Rehab consult  Frequency Min 3X/week   Plan Discharge plan needs to be updated    Precautions / Restrictions Precautions Precautions: Fall   Pertinent Vitals/Pain No pain complaints    Mobility  Bed Mobility Bed Mobility: Rolling Right;Right Sidelying to Sit Rolling Right: 3: Mod assist Rolling Left: 1: +2 Total assist;With rail Rolling Left: Patient Percentage: 40% Details for Bed Mobility Assistance: assist from sidelying to get feet off bed and to lift trunk; still little sleepy and not following commands consistently Transfers Transfers: Sit to Stand;Stand to Sit Sit to Stand: 1: +2 Total assist;From bed;From toilet Sit to Stand: Patient Percentage: 40% Stand to Sit: To toilet;To bed;3: Mod assist Details for Transfer Assistance: use of grabbar to toilet, increased assist needed from toilet more like pt=50-60% from bed; cues for safety to sit due to trying to reach to sit prior to being backed up close to seating surface Ambulation/Gait Ambulation/Gait Assistance: 3: Mod assist Ambulation Distance (Feet): 80 Feet Assistive device: Rolling walker Ambulation/Gait Assistance Details: left LE buckling over time, cues for  proximity to walker and tall posture, stayed hunched over mostly Gait Pattern: Shuffle;Trunk flexed;Trunk rotated posteriorly on left;Decreased stride length;Decreased weight shift to right      PT Goals Acute Rehab PT Goals Pt will go Supine/Side to Sit: with min assist;with rail PT Goal: Supine/Side to Sit - Progress: Progressing toward goal Pt will Sit at Edge of Bed: 6-10 min;with bilateral upper extremity support;with modified independence PT Goal: Sit at Edge Of Bed - Progress: Updated due to goal met Pt will go Sit to Stand: with supervision;with upper extremity assist PT Goal: Sit to Stand - Progress: Updated due to goal met Pt will Transfer Bed to Chair/Chair to Bed: with supervision PT Transfer Goal: Bed to Chair/Chair to Bed - Progress: Updated due to goal met Pt will Ambulate: >150 feet;with rolling walker;with min assist PT Goal: Ambulate - Progress: Updated due to goal met  Visit Information  Last PT Received On: 09/25/12    Subjective Data  Subjective: Think we need to plan to go by the bathroom.   Cognition  Cognition Area of Impairment: Awareness of deficits;Safety/judgement;Following commands Arousal/Alertness: Awake/alert Orientation Level: Appears intact for tasks assessed Behavior During Session: [facility] for tasks performed Current Attention Level: Sustained Following Commands: Follows multi-step commands inconsistently;Follows one step commands with increased time Safety/Judgement: Decreased safety judgement for tasks assessed    Balance  Static Sitting Balance Static Sitting - Balance Support: Feet supported Static Sitting - Level of Assistance: 4: Min assist;5: Stand by assistance Static Sitting - Comment/# of Minutes: kept leaning back initially, then improved over time; partially due to cervical flexion with thoracic kyphosis and pt leans posterior to make  visualize environment  End of Session PT - End of Session Equipment Utilized During Treatment: Gait  belt Activity Tolerance: Patient limited by fatigue Patient left: in bed;with call bell/phone within reach;with bed alarm set Nurse Communication: Mobility status   GP     [facility] 09/25/2012, 4:59 PM Sheran Lawless, PT [phone] 09/25/2012

## 2012-09-25 NOTE — Clinical Documentation Improvement (Signed)
Abnormal Labs Clarification  THIS DOCUMENT IS NOT A PERMANENT PART OF THE MEDICAL RECORD  TO RESPOND TO THE THIS QUERY, FOLLOW THE INSTRUCTIONS BELOW:  1. If needed, update documentation for the patient's encounter via the notes activity.  2. Access this query again and click edit on the [employer].  3. After updating, or not, click F2 to complete all highlighted (required) fields concerning your review. Select "additional documentation in the medical record" OR "no additional documentation provided".  4. Click Sign note button.  5. The deficiency will fall out of your InBasket *Please let us know if you are not able to complete this workflow by phone or e-mail (listed below).  Please update your documentation within the medical record to reflect your response to this query.                                                                                   09/25/12  Dear Dr.  Molli Knock  Marton Redwood  In a better effort to capture your patient's severity of illness, reflect appropriate length of stay and utilization of resources, a review of the medical record has revealed the following indicators.    Based on your clinical judgment, please clarify and document in a progress note and/or discharge summary the clinical condition associated with the following supporting information:    NOTED IN RADIOLOGY REPORT:  "Significant mass effect with 14 mm right to left midline shift and anterior subfalcine herniation". If you agree with HERNIATION please document in Notes and DC summary.                             Reviewed: additional documentation in the medical record   Thank You,  Beverley Fiedler RN BSN Clinical Documentation Specialist: Tele Contact:  [phone]  Health Information Management [place]  I added to my last note but suggest sending query to neuro as well since they did the discharge summary.  Alyson Reedy, M.D. [facility] Pulmonary/Critical Care Medicine. Pager:  [phone]. After hours pager: [phone].

## 2012-09-25 NOTE — Progress Notes (Signed)
PULMONARY  / CRITICAL CARE MEDICINE  Name: DRAYDEN LUKAS MRN: [id number] DOB: 09-Aug-1933    ADMISSION DATE:  09/18/2012 CONSULTATION DATE: 09/25/2012   REFERRING MD :  Dr Phoebe Perch PRIMARY SERVICE: Neurosurgery   CHIEF COMPLAINT:  Acute encephalopathy  BRIEF PATIENT DESCRIPTION: 79 year old man, with past medical history of hypertension, and hypothyroidism, admitted with a large right subdural hematoma. Has acute respiratory failure post subdural evacuation along with severe hypertension.   SIGNIFICANT EVENTS / STUDIES:  Head CT scan-right large subdural hematoma with 1.44 cm midline shift. 4/4 > Craniotomy/hematoma evacuation 4/4 extubation postop  LINES / TUBES: 09/19/2012 - ETT.>>4/4   CULTURES: None  ANTIBIOTICS: IV Ancef periop   SUBJECTIVE:   Confused, agitated at times, not protecting airway, getting TF  VITAL SIGNS: Temp:  [97.5 F (36.4 C)-98.3 F (36.8 C)] 98 F (36.7 C) (04/10 0800) Pulse Rate:  [58-94] 86 (04/10 1000) Resp:  [8-23] 19 (04/10 1000) BP: (103-138)/(49-91) 138/74 mmHg (04/10 1000) SpO2:  [97 %-100 %] 100 % (04/10 1000) Weight:  [78.1 kg (172 lb 2.9 oz)] 78.1 kg (172 lb 2.9 oz) (04/10 0500) HEMODYNAMICS: CV stable   VENTILATOR SETTINGS: Extubated to RA 4/4.  4/5 on RA sats 98-100%   INTAKE / OUTPUT: Intake/Output     04/09 0701 - 04/10 0700 04/10 0701 - 04/11 0700   I.V. (mL/kg) 35.6 (0.5)    NG/GT     IV Piggyback 760    Total Intake(mL/kg) 795.6 (10.2)    Urine (mL/kg/hr) 2125 (1.1)    Drains 65 (0)    Total Output 2190     Net -1394.4          Urine Occurrence 2 x 1 x     PHYSICAL EXAMINATION: General: Lethargic but arousable and recognizes family, no distress, tracks and follows simple commands. Neuro: Lethargic.  None focal. HEENT: Right craniotomy without active bleeding. NGT in place. Cardiovascular:  Heart sounds WNL, regular and normal.  No added sounds. No murmurs. Lungs: Very rhonchus no accessory muscle use.  Upper  airway sounds. Abdomen: Nondistended, nontender, normal bowel sounds. No palpable masses. Musculoskeletal: Unremarkable  Skin: No rashes.  Recent Labs Lab 09/23/12 0415 09/24/12 0515 09/25/12 0335  NA 133* 136 135  K 4.5 3.9 3.5  CL 102 96 96  CO2 22 25 27   BUN 16 20 26*  CREATININE 0.55 0.71 0.73  GLUCOSE 209* 144* 132*   Recent Labs Lab 09/23/12 0415 09/24/12 0515 09/25/12 0335  HGB 12.9* 13.9 13.8  HCT 35.2* 40.5 40.6  WBC 11.2* 11.3* 9.7  PLT 347 354 347   Recent Labs Lab 09/24/12 1557 09/24/12 1958 09/25/12 0001 09/25/12 0425 09/25/12 0740  GLUCAP 116* 115* 119* 124* 120*   CXR No film 4/6  ASSESSMENT / PLAN: Principal Problem:   Subdural hematoma Active Problems:   HYPOTHYROIDISM   HYPERTENSION   Diabetes mellitus type II, uncontrolled   Respiratory failure, post-operative   S/P craniotomy   Dysphagia  NEUROLOGIC A:   SDH, s/p craniotomy and hematoma evacuation    P:   - Per NSGY, back to the OR 4/7, now more interactive. - Continue with Keppra infusion as per neurosurgery . - Minimize sedation.  PULMONARY A:  Post surgery respiratory failure. S/p extubation 4/4, reintubated for OR visit on 4/7. P:   - Extubated but airway protection is questionable at best, spoke with family extensively today, they are pro the idea of trach/peg if needs be but they would not want that in  indefinitely.  Only if there is a chance for improvement, will keep full code for now. - TF after insertion of NGT.  CARDIOVASCULAR A:HTN (better control as of 4/5 and 4/6) P:  - IV Hydralazine given bradycardia.  - Goal SBP < 160 per neurosurgery - Hold beta blockers for today.  RENAL: No acute issues. Forming urine.  P:   - Lasix 40 mg IV q8 x2. - KCl PO x2 doses. - BMET daily.  GASTROINTESTINAL A:  Failed swallow eval post extubation.  Not able to protect airway P:   - Restart tube feeds.  HEMATOLOGIC A: No acute issues. No active bleeding. Drain with 25 cc  of blood P:  - CBC in AM. - SCDs for DVT proph, cannot get hep/lmwh.  INFECTIOUS A: No active issues, mild leukocytosis P:   - No intervention. - CBC in AM.  ENDOCRINE A:  Mild hyperglycemia, hypothyroidism   P:   - SSI. - Continue with synthroid.  TODAY'S SUMMARY: 79 year old man, with past medical history of hypertension, and hypothyroidism, admitted with a large right subdural hematoma. Had acute respiratory failure post subdural evacuation along with severe hypertension post op now better 09/20/12. Now extubated 4/4.  Reintubated on 4/7 and extubated on 4/9, airway protection still a major concern, will hold in ICU.  I have personally obtained a history, examined the patient, evaluated laboratory and imaging results, formulated the assessment and plan and placed orders.  CRITICAL CARE: The patient is critically ill with multiple organ systems failure and requires high complexity decision making for assessment and support, frequent evaluation and titration of therapies, application of advanced monitoring technologies and extensive interpretation of multiple databases. Critical Care Time devoted to patient care services described in this note is 35 minutes.   Alyson Reedy, M.D. [facility] Pulmonary/Critical Care Medicine. Pager: [phone]. After hours pager: [phone].

## 2012-09-26 ENCOUNTER — Inpatient Hospital Stay (HOSPITAL_COMMUNITY): Payer: Medicare Other

## 2012-09-26 DIAGNOSIS — S065X9A Traumatic subdural hemorrhage with loss of consciousness of unspecified duration, initial encounter: Secondary | ICD-10-CM

## 2012-09-26 LAB — GLUCOSE, CAPILLARY
Glucose-Capillary: 120 mg/dL — ABNORMAL HIGH (ref 70–99)
Glucose-Capillary: 127 mg/dL — ABNORMAL HIGH (ref 70–99)
Glucose-Capillary: 145 mg/dL — ABNORMAL HIGH (ref 70–99)
Glucose-Capillary: 152 mg/dL — ABNORMAL HIGH (ref 70–99)

## 2012-09-26 LAB — BASIC METABOLIC PANEL
BUN: 30 mg/dL — ABNORMAL HIGH (ref 6–23)
Chloride: 100 mEq/L (ref 96–112)
GFR calc Af Amer: >90 mL/min (ref >90)
Glucose, Bld: 154 mg/dL — ABNORMAL HIGH (ref 70–99)
Potassium: 4 mEq/L (ref 3.5–5.1)

## 2012-09-26 LAB — CBC
HCT: 42.2 % (ref 39.0–52.0)
Hemoglobin: 14.5 g/dL (ref 13.0–17.0)
RDW: 13.4 % (ref 11.5–15.5)
WBC: 12.2 10*3/uL — ABNORMAL HIGH (ref 4.0–10.5)

## 2012-09-26 MED ORDER — FUROSEMIDE 10 MG/ML IJ SOLN
40.0000 mg | Freq: Three times a day (TID) | INTRAMUSCULAR | Status: AC
  Administered 2012-09-26 (×2): 40 mg via INTRAVENOUS
  Filled 2012-09-26 (×2): qty 4

## 2012-09-26 MED ORDER — POTASSIUM CHLORIDE 20 MEQ/15ML (10%) PO LIQD
40.0000 meq | Freq: Three times a day (TID) | ORAL | Status: AC
  Administered 2012-09-26 (×2): 40 meq
  Filled 2012-09-26 (×2): qty 30

## 2012-09-26 NOTE — Progress Notes (Signed)
NUTRITION FOLLOW UP  Intervention:   Magic cup TID between meals, each supplement provides 290 kcal and 9 grams of protein.  Nutrition Dx:   Inadequate oral intake related to inability to eat as evidenced by NPO status; progressing  Goal:   Pt to meet >/= 90% of their estimated nutrition needs; not met.   Monitor:   PO intake, weight trends, labs  Assessment:   Pt transferred from [facility] with large right SDH. S/P craniotomy with subdural evacuation. Pt extubated 4/4 but re-intubated 4/7 with second SDH evacuation and extubated 4/9.  Per MD pt may still need trach/PEG.  TF d/c'ed this am, pt able to pass swallow evaluation.  Spoke to pt and wife and daughter. Per pt he is not hungry, he does like Ice cream and is willing to try Magic cup.    Height: Ht Readings from Last 1 Encounters:  09/22/12 6\' 1"  (1.854 m)    Weight Status:   Wt Readings from Last 1 Encounters:  09/26/12 165 lb 12.6 oz (75.2 kg)  Admission weight: 180 lb   Re-estimated needs:  Kcal: 1900-2100  Protein: 80-90 gm  Fluid: 1.9-2.1 L   Skin: incisions  Diet Order: Dysphagia 2 with Thin Liquids   Intake/Output Summary (Last 24 hours) at 09/26/12 1236 Last data filed at 09/26/12 1100  Gross per 24 hour  Intake   1445 ml  Output   1850 ml  Net   -405 ml    Last BM: 4/10   Labs:   Recent Labs Lab 09/24/12 0515 09/25/12 0335 09/26/12 0610  NA 136 135 138  K 3.9 3.5 4.0  CL 96 96 100  CO2 25 27 30   BUN 20 26* 30*  CREATININE 0.71 0.73 0.79  CALCIUM 9.1 9.0 9.6  MG 2.3 2.5 2.7*  PHOS 3.1 5.0* 3.8  GLUCOSE 144* 132* 154*    CBG (last 3)   Recent Labs  09/26/12 0042 09/26/12 0307 09/26/12 0741  GLUCAP 120* 129* 134*    Scheduled Meds: . antiseptic oral rinse  15 mL Mouth Rinse QID  . atenolol  12.5 mg Per Tube Daily  . chlorhexidine  15 mL Mouth Rinse BID  . furosemide  40 mg Intravenous Q8H  . insulin aspart  0-15 Units Subcutaneous Q4H  . levETIRAcetam  500 mg Intravenous  BID  . levothyroxine  50 mcg Per Tube QAC breakfast  . pantoprazole sodium  40 mg Per Tube Daily  . potassium chloride  40 mEq Per Tube TID  . simvastatin  10 mg Per Tube QHS    Continuous Infusions:    Kendell Bane RD, LDN, CNSC [phone] Pager [phone] After Hours Pager

## 2012-09-26 NOTE — Progress Notes (Signed)
Rehab Admissions Coordinator Note:  Patient was screened by [name] for appropriateness for an Inpatient Acute Rehab Consult.  Rehab consult is pending today.  [name] 09/26/2012, 8:52 AM  I can be reached at [phone].

## 2012-09-26 NOTE — Procedures (Signed)
Objective Swallowing Evaluation: Modified Barium Swallowing Study  Patient Details  Name: Gerald Leach MRN: [id number] Date of Birth: 1933/08/30  Today's Date: 09/26/2012 Time: 1100-1130 SLP Time Calculation (min): 30 min  Past Medical History:  Past Medical History  Diagnosis Date  . Hypertension   . Colonic polyp 2003  . GERD (gastroesophageal reflux disease)   . ED (erectile dysfunction)   . Hyperlipidemia   . Hypothyroidism   . Brain aneurysm    Past Surgical History:  Past Surgical History  Procedure Laterality Date  . Brain aneurysm surgery      at [facility]  . Carotid artery aneurysm    . Colonoscopy      polyps  . Right hernia    . Craniotomy Right 09/19/2012    Procedure: CRANIOTOMY HEMATOMA EVACUATION SUBDURAL;  Surgeon: Clydene Fake, MD;  Location: [facility] NEURO ORS;  Service: Neurosurgery;  Laterality: Right;  . Craniotomy Right 09/22/2012    Procedure: CRANIOTOMY HEMATOMA EVACUATION SUBDURAL;  Surgeon: Clydene Fake, MD;  Location: [facility] NEURO ORS;  Service: Neurosurgery;  Laterality: Right;   HPI:  79 year old man, with past medical history of hypertension, and hypothyroidism, admited with a large right subdural hematoma. Pt intubated for procedure 4/4, extubated at 12 pm. Reintubatd 4/7 for craniotomy, extubated 4/9.      Assessment / Plan / Recommendation Clinical Impression  Dysphagia Diagnosis: Moderate oral phase dysphagia;Mild pharyngeal phase dysphagia Clinical impression: Patient presents with a moderate oral and a mild pharyngeal phase dysphagia. Oral weakness combined with AMS results in decreased bolus cohesion and intermittent anterior labial spillage of mild oral residuals and mild-moderate vallecular residuals post swallow (also may be impacted by presence of large bore NG tube). Intermittent delayed swallow initiation lead to one episode of trace penetration of thin liquids which cleared with spontaneous dry swallows. Otherwise, patient able to fully  protect airway during today's study. Recommend initiation of a dysphagia 2 (chopped) diet, thin liquids, no straws to decrease risk of aspiration. SLP will f/u closely for diet tolerance.     Treatment Recommendation  Therapy as outlined in treatment plan below    Diet Recommendation Dysphagia 2 (Fine chop);Thin liquid   Liquid Administration via: Cup;No straw Medication Administration: Crushed with puree Supervision: Patient able to self feed;Full supervision/cueing for compensatory strategies Compensations: Slow rate;Small sips/bites;Multiple dry swallows after each bite/sip Postural Changes and/or Swallow Maneuvers: Seated upright 90 degrees    Other  Recommendations Oral Care Recommendations: Oral care BID   Follow Up Recommendations  Inpatient Rehab    Frequency and Duration min 3x week  2 weeks       SLP Swallow Goals Patient will utilize recommended strategies during swallow to increase swallowing safety with: Minimal cueing Swallow Study Goal #2 - Progress: Progressing toward goal Goal #3: Pt will sustain attention to trials of thin liquids and solids with timley oral transit and swallow response with min verbal cues.  Swallow Study Goal #3 - Progress: Met   General HPI: 79 year old man, with past medical history of hypertension, and hypothyroidism, admited with a large right subdural hematoma. Pt intubated for procedure 4/4, extubated at 12 pm. Reintubatd 4/7 for craniotomy, extubated 4/9.  Type of Study: Modified Barium Swallowing Study Reason for Referral: Objectively evaluate swallowing function Previous Swallow Assessment: none Diet Prior to this Study: NPO Temperature Spikes Noted: No Respiratory Status: Room air History of Recent Intubation: Yes Length of Intubations (days): 1 days Date extubated: 09/19/12 Behavior/Cognition: Alert;Cooperative;Pleasant mood;Requires cueing;Decreased sustained attention Oral  Cavity - Dentition: Adequate natural dentition Oral  Motor / Sensory Function: Impaired - see Bedside swallow eval Self-Feeding Abilities: Able to feed self Patient Positioning: Upright in chair Baseline Vocal Quality: Clear Volitional Cough: Strong Volitional Swallow: Able to elicit Anatomy: Within functional limits Pharyngeal Secretions: Not observed secondary MBS    Reason for Referral Objectively evaluate swallowing function   Oral Phase Oral Preparation/Oral Phase Oral Phase: Impaired Oral - Nectar Oral - Nectar Teaspoon: Lingual/palatal residue Oral - Nectar Cup: Lingual/palatal residue Oral - Thin Oral - Thin Cup: Lingual/palatal residue;Right anterior bolus loss;Left anterior bolus loss Oral - Thin Straw: Lingual/palatal residue;Right anterior bolus loss;Left anterior bolus loss (anterior loss post swallow) Oral - Solids Oral - Puree: Lingual/palatal residue Oral - Mechanical Soft: Lingual/palatal residue   Pharyngeal Phase Pharyngeal Phase Pharyngeal Phase: Impaired Pharyngeal - Nectar Pharyngeal - Nectar Teaspoon: Delayed swallow initiation;Premature spillage to valleculae;Pharyngeal residue - valleculae;Reduced tongue base retraction Pharyngeal - Nectar Cup: Delayed swallow initiation;Premature spillage to valleculae;Pharyngeal residue - valleculae;Reduced tongue base retraction Pharyngeal - Thin Pharyngeal - Thin Cup: Delayed swallow initiation;Premature spillage to valleculae;Pharyngeal residue - valleculae;Reduced tongue base retraction Pharyngeal - Thin Straw: Delayed swallow initiation;Pharyngeal residue - valleculae;Reduced tongue base retraction;Premature spillage to pyriform sinuses;Premature spillage to valleculae;Penetration/Aspiration before swallow Penetration/Aspiration details (thin straw): Material enters airway, remains ABOVE vocal cords and not ejected out (ejected with subsequent swallow) Pharyngeal - Solids Pharyngeal - Puree: Delayed swallow initiation;Premature spillage to valleculae;Pharyngeal residue -  valleculae;Reduced tongue base retraction Pharyngeal - Mechanical Soft: Delayed swallow initiation;Premature spillage to valleculae;Pharyngeal residue - valleculae;Reduced tongue base retraction Pharyngeal Phase - Comment Pharyngeal Comment: Delayed swallow initiation;Premature spillage to valleculae;Pharyngeal residue - valleculae;Reduced tongue base retraction  Cervical Esophageal Phase    GO  Ferdinand Lango MA, CCC-SLP [phone]   Cervical Esophageal Phase Cervical Esophageal Phase: [facility]         Gerald Leach 09/26/2012, 1:47 PM

## 2012-09-26 NOTE — Progress Notes (Signed)
Speech Language Pathology Dysphagia Treatment Patient Details Name: Gerald Leach MRN: [id number] DOB: 12-31-1933 Today's Date: 09/26/2012 Time: 4098-1191 SLP Time Calculation (min): 15 min  Assessment / Plan / Recommendation Clinical Impression  Patient presents with a suspected primary esophageal dysphagia with a secondary oral component with acute and chronic origin. Patient with acute onset left sided facial weakness( mild) however also presents with reported right sided facial numbness which began about 4-5 months ago per patient report. RN informed and MD aware. The combination of above results in mild right sided anterior labial spillage of bolus and mild residuals which are cleared with moderate SLP verbal cues for lingual sweep. Primarily however, patient with c/o globus and regurgitation of tough/dry solids post swallow suggestive of esophageal deficits, particulary in light of recently noted alcohol abuse. Recommend initiation of a mechanical soft diet, thin liquids with aspiration and reflux precautions. Education complete with patient and son. Would also consider however esophageal w/u while inpatient. SLP will f/u for diet tolerance and need for additional SLP services. At this time, cognitive linguistic function appears to be (by SLP)and is reported to be at baseline per patient and son. Defer cognitive-linguistic evaluation.     Diet Recommendation  Continue with Current Diet: NPO    SLP Plan MBS   Pertinent Vitals/Pain None reported   Swallowing Goals  SLP Swallowing Goals Patient will utilize recommended strategies during swallow to increase swallowing safety with: Minimal cueing Swallow Study Goal #2 - Progress: Progressing toward goal Goal #3: Pt will sustain attention to trials of thin liquids and solids with timley oral transit and swallow response with min verbal cues.  Swallow Study Goal #3 - Progress: Met  General Temperature Spikes Noted: No Respiratory Status: Room  air Behavior/Cognition: Alert;Cooperative;Pleasant mood;Confused;Decreased sustained attention;Distractible;Requires cueing Oral Cavity - Dentition: Adequate natural dentition Patient Positioning: Upright in bed  Oral Cavity - Oral Hygiene Patient is HIGH RISK - Oral Care Protocol followed (see row info): Yes   Dysphagia Treatment Treatment focused on: Upgraded PO texture trials;Patient/family/caregiver education;Utilization of compensatory strategies;Facilitation of oral preparatory phase Family/Caregiver Educated: wife Treatment Methods/Modalities: Skilled observation;Differential diagnosis Patient observed directly with PO's: Yes Type of PO's observed: Dysphagia 1 (puree);Thin liquids;Ice chips Feeding: Needs assist Liquids provided via: Teaspoon Oral Phase Signs & Symptoms: Anterior loss/spillage Pharyngeal Phase Signs & Symptoms: Immediate cough Type of cueing: Verbal;Tactile;Visual Amount of cueing: Moderate   GO   Ferdinand Lango MA, CCC-SLP [phone]   Ferdinand Lango Meryl 09/26/2012, 10:47 AM

## 2012-09-26 NOTE — Progress Notes (Signed)
UR completed 

## 2012-09-26 NOTE — Progress Notes (Addendum)
NGT placed ,verified with auscultation and xray, read by Dr. Darrick Penna. Restarted tube feeds at 35/hour after advancing tube abt 2cm. Continuing to monitor.

## 2012-09-26 NOTE — Progress Notes (Signed)
Physical Therapy Treatment Patient Details Name: Gerald Leach MRN: [id number] DOB: [birth year] Today's Date: 09/26/2012 Time: 1209-1239 PT Time Calculation (min): 30 min  PT Assessment / Plan / Recommendation Comments on Treatment Session  Patient progressing with activity tolerance, but remains limited with safety with transfers and gait.  Has difficulty with left LE clearance and stance control during gait and standing activities.  Also premorbid postural changes exacerbated due to pain after craniotomy x 2.  Feel still very appropriate for CIR at this time.    Follow Up Recommendations  CIR           Equipment Recommendations  Rolling walker with 5" wheels       Frequency Min 3X/week   Plan Frequency needs to be updated    Precautions / Restrictions Precautions Precautions: Fall   Pertinent Vitals/Pain No pain complaints    Mobility  Bed Mobility Bed Mobility: Supine to Sit;Sit to Supine Supine to Sit: 4: Min assist;HOB elevated Sitting - Scoot to Edge of Bed: 5: Supervision Sit to Supine: 5: Supervision;HOB flat Transfers Sit to Stand: 4: Min assist;From bed;From chair/3-in-1;With upper extremity assist Stand to Sit: 3: Mod assist;With armrests;To chair/3-in-1;To bed Details for Transfer Assistance: max cues for safety backing up to toilet and to bed, tends to pull up on walker Ambulation/Gait Ambulation/Gait Assistance: 3: Mod assist Ambulation Distance (Feet): 80 Feet Assistive device: Rolling walker Ambulation/Gait Assistance Details: facilitation and cues for left foot clearance with swing and extension in stance, for left pelvic protraction Gait Pattern: Step-through pattern;Decreased stride length;Decreased weight shift to right;Left flexed knee in stance;Right flexed knee in stance;Lateral trunk lean to left;Wide base of support;Trunk flexed;Shuffle     PT Goals Acute Rehab PT Goals Pt will go Supine/Side to Sit: with rail;with modified independence PT  Goal: Supine/Side to Sit - Progress: Updated due to goal met Pt will go Sit to Stand: with supervision;with upper extremity assist PT Goal: Sit to Stand - Progress: Goal set today Pt will Stand: with supervision;with unilateral upper extremity support PT Goal: Stand - Progress: Goal set today Pt will Ambulate: >150 feet;with rolling walker;with min assist PT Goal: Ambulate - Progress: Progressing toward goal  Visit Information  Last PT Received On: 09/26/12 PT/OT Co-Evaluation/Treatment: Yes    Subjective Data  Subjective: I saw you yesterday   Cognition  Cognition Area of Impairment: Awareness of deficits;Safety/judgement;Following commands Arousal/Alertness: Awake/alert Orientation Level: Appears intact for tasks assessed Behavior During Session: [facility] for tasks performed Current Attention Level: Sustained Following Commands: Follows multi-step commands inconsistently;Follows one step commands with increased time Safety/Judgement: Decreased safety judgement for tasks assessed Safety/Judgement - Other Comments: mod/max cues throughout session for safety with walker    Balance  Dynamic Standing Balance Dynamic Standing - Balance Support: During functional activity;No upper extremity supported Dynamic Standing - Level of Assistance: 3: Mod assist Dynamic Standing - Comments: washing hands at sink with OT, and performing toilet hygiene with PT  End of Session PT - End of Session Equipment Utilized During Treatment: Gait belt Activity Tolerance: Patient limited by fatigue Patient left: in bed;with call bell/phone within reach Nurse Communication: Mobility status   GP     [facility] 09/26/2012, 1:30 PM Sheran Lawless, PT [phone] 09/26/2012

## 2012-09-26 NOTE — Clinical Social Work Note (Signed)
Clinical Social Worker continuing to follow patient and family for support and discharge planning needs.  CSW to further pursue SNF options now that patient is intubated.  Patient does have a rehab consult pending - CSW spoke with inpatient rehab admissions coordinator who plans to notify patient insurance and obtain approval pending patient progress.  Patient remains lethargic but managing well off the ventilator.  CSW to initiate SNF search once FL2 completed and follow up with patient and family regarding potential bed offers.  CSW available for support and to facilitate patient discharge needs once medically stable.  Macario Golds, [place] [phone]

## 2012-09-26 NOTE — Consult Note (Signed)
Physical Medicine and Rehabilitation Consult Reason for Consult: Recurrent subdural hematoma Referring Physician: Dr. Phoebe Perch   HPI: Gerald Leach is a 79 y.o. right-handed male with history of brain aneurysm that required surgery at [facility] 2002. Admitted 09/19/2012 with altered mental status and vomiting x24 hours. CT scan imaging revealed subdural hematoma. Underwent right craniotomy hematoma evacuation 09/19/2012 per Dr. Phoebe Perch. Patient with increasing lethargy 09/22/2012 with followup cranial CT scan showing recurrent bleeding in the right subdural space and returned to the OR for redo right craniotomy for recurrent subdural hematoma 09/22/2012. Critical care medicine consulted postoperatively for acute respiratory issues patient was extubated 09/24/2012. Maintained on Keppra for seizure prophylaxis. Patient remained n.p.o. with nasogastric tube feeds for nutritional support. Physical therapy evaluation completed an ongoing noted issues in regards to being impulsive, restless and agitated. Recommendations are made for physical medicine rehabilitation consult to consider inpatient rehabilitation services Patient was not able to be aroused about a half hour ago when my PA checked on patient Patient currently is awake was trying to get out of bed.  Patient feels he is ready to go home now "I felt bad about 6 months ago and then it fell again more recently" Review of Systems  Unable to perform ROS All other systems reviewed and are negative.   Past Medical History  Diagnosis Date  . Hypertension   . Colonic polyp 2003  . GERD (gastroesophageal reflux disease)   . ED (erectile dysfunction)   . Hyperlipidemia   . Hypothyroidism   . Brain aneurysm    Past Surgical History  Procedure Laterality Date  . Brain aneurysm surgery      at [facility]  . Carotid artery aneurysm    . Colonoscopy      polyps  . Right hernia    . Craniotomy Right 09/19/2012    Procedure: CRANIOTOMY  HEMATOMA EVACUATION SUBDURAL;  Surgeon: Clydene Fake, MD;  Location: [facility] NEURO ORS;  Service: Neurosurgery;  Laterality: Right;  . Craniotomy Right 09/22/2012    Procedure: CRANIOTOMY HEMATOMA EVACUATION SUBDURAL;  Surgeon: Clydene Fake, MD;  Location: [facility] NEURO ORS;  Service: Neurosurgery;  Laterality: Right;   History reviewed. No pertinent family history. Social History:  reports that he has never smoked. He has never used smokeless tobacco. He reports that  drinks alcohol. He reports that he does not use illicit drugs. Allergies: No Known Allergies Medications Prior to Admission  Medication Sig Dispense Refill  . aspirin EC 81 MG tablet Take 81 mg by mouth at bedtime.      Marland Kitchen atenolol (TENORMIN) 12.5 mg TABS Take 0.5 tablets (12.5 mg total) by mouth daily.  100 tablet  3  . HYDROcodone-acetaminophen (NORCO) 7.5-325 MG per tablet Take 1 tablet by mouth every 6 (six) hours as needed for pain.       Marland Kitchen levothyroxine (SYNTHROID, LEVOTHROID) 50 MCG tablet Take 50 mcg by mouth daily before breakfast.      . metFORMIN (GLUCOPHAGE) 500 MG tablet Take 500 mg by mouth 2 (two) times daily with a meal.      . omeprazole (PRILOSEC) 20 MG capsule Take 20 mg by mouth every Monday, Wednesday, and Friday.       . simvastatin (ZOCOR) 10 MG tablet Take 10 mg by mouth at bedtime.      . traMADol (ULTRAM) 50 MG tablet Take 50 mg by mouth every 8 (eight) hours as needed for pain.      Marland Kitchen glucose blood (FREESTYLE LITE)  test strip 1 each by Other route daily. Use as instructed  100 each  3  . ONE TOUCH LANCETS MISC 1 each by Does not apply route daily.  200 each  0    Home: Home Living Lives With: Spouse Available Help at Discharge: Family;Available 24 hours/day;Other (Comment) (wife had back surgery last week per son) Type of Home: House Home Access: Level entry Home Layout: Multi-level Alternate Level Stairs-Number of Steps: 7 Alternate Level Stairs-Rails: None Bathroom Shower/Tub: Agricultural engineer: Standard Home Adaptive Equipment: Straight cane Additional Comments: Pt fell 6 months ago and has used cane since that time but son states no real balance deficits prior to this admit  Functional History: Prior Function Able to Take Stairs?: Yes Driving: Yes Vocation: Retired Functional Status:  Mobility: Bed Mobility Bed Mobility: Rolling Right;Right Sidelying to Sit Rolling Right: 3: Mod assist Rolling Left: 1: +2 Total assist;With rail Rolling Left: Patient Percentage: 40% Left Sidelying to Sit: 1: +2 Total assist;With rails;HOB flat Left Sidelying to Sit: Patient Percentage: 20% Sitting - Scoot to Edge of Bed: 1: +1 Total assist Transfers Transfers: Sit to Stand;Stand to Sit Sit to Stand: 1: +2 Total assist;From bed;From toilet Sit to Stand: Patient Percentage: 40% Stand to Sit: To toilet;To bed;3: Mod assist Ambulation/Gait Ambulation/Gait Assistance: 3: Mod assist Ambulation Distance (Feet): 80 Feet Assistive device: Rolling walker Ambulation/Gait Assistance Details: left LE buckling over time, cues for proximity to walker and tall posture, stayed hunched over mostly Gait Pattern: Shuffle;Trunk flexed;Trunk rotated posteriorly on left;Decreased stride length;Decreased weight shift to right Stairs: No Wheelchair Mobility Wheelchair Mobility: No  ADL:    Cognition: Cognition Arousal/Alertness: Awake/alert Orientation Level: Oriented to person;Oriented to place;Oriented to time;Oriented to situation Cognition Overall Cognitive Status: Impaired Area of Impairment: Awareness of deficits;Safety/judgement;Following commands Arousal/Alertness: Awake/alert Orientation Level: Appears intact for tasks assessed Behavior During Session: [facility] for tasks performed Current Attention Level: Sustained Following Commands: Follows multi-step commands inconsistently;Follows one step commands with increased time Safety/Judgement: Decreased safety judgement for tasks  assessed Safety/Judgement - Other Comments: Pt has hand restraints and waist belt for safety  Blood pressure 154/85, pulse 58, temperature 97.9 F (36.6 C), temperature source Oral, resp. rate 15, height 6\' 1"  (1.854 m), weight 75.2 kg (165 lb 12.6 oz), SpO2 99.00%. Physical Exam  Vitals reviewed. Constitutional:  79 year old white male with nasogastric tube in place  HENT:  Oral hygiene is poor with dried secretions around the palate  Eyes:  Pupils are reactive to light.  Neck: Neck supple. No thyromegaly present.  Cardiovascular: Normal rate and regular rhythm.   Pulmonary/Chest:  Decreased breath sounds at the bases but clear to auscultation.  Abdominal: Bowel sounds are normal. He exhibits no distension.  Neurological:  Patient is lethargic but will arouse to his name. He was difficult to keep awake during his exam. He would not follow commands.  Skin:  Craniotomy site with blood on the bandage.  4/5 strength in bilateral deltoid, biceps, triceps, grip, hip flexor, knee extensors, ankle dorsiflexor plantar flexor Sensation unable to assess secondary to decreased attention Oriented to person and hospital.Not oriented to time or situation. After cueing he does realize he's had brain surgery  Results for orders placed during the hospital encounter of 09/18/12 (from the past 24 hour(s))  GLUCOSE, CAPILLARY     Status: Abnormal   Collection Time    09/25/12 11:59 AM      Result Value Range   Glucose-Capillary 127 (*) 70 - 99 mg/dL  GLUCOSE, CAPILLARY     Status: Abnormal   Collection Time    09/25/12  4:04 PM      Result Value Range   Glucose-Capillary 115 (*) 70 - 99 mg/dL  GLUCOSE, CAPILLARY     Status: Abnormal   Collection Time    09/25/12  7:32 PM      Result Value Range   Glucose-Capillary 112 (*) 70 - 99 mg/dL   Comment 1 Notify RN     Comment 2 Documented in Chart    GLUCOSE, CAPILLARY     Status: Abnormal   Collection Time    09/26/12 12:42 AM      Result Value  Range   Glucose-Capillary 120 (*) 70 - 99 mg/dL  GLUCOSE, CAPILLARY     Status: Abnormal   Collection Time    09/26/12  3:07 AM      Result Value Range   Glucose-Capillary 129 (*) 70 - 99 mg/dL  CBC     Status: Abnormal   Collection Time    09/26/12  6:10 AM      Result Value Range   WBC 12.2 (*) 4.0 - 10.5 K/uL   RBC 5.16  4.22 - 5.81 MIL/uL   Hemoglobin 14.5  13.0 - 17.0 g/dL   HCT 21.3  08.6 - 57.8 %   MCV 81.8  78.0 - 100.0 fL   MCH 28.1  26.0 - 34.0 pg   MCHC 34.4  30.0 - 36.0 g/dL   RDW 46.9  62.9 - 52.8 %   Platelets 383  150 - 400 K/uL  BASIC METABOLIC PANEL     Status: Abnormal   Collection Time    09/26/12  6:10 AM      Result Value Range   Sodium 138  135 - 145 mEq/L   Potassium 4.0  3.5 - 5.1 mEq/L   Chloride 100  96 - 112 mEq/L   CO2 30  19 - 32 mEq/L   Glucose, Bld 154 (*) 70 - 99 mg/dL   BUN 30 (*) 6 - 23 mg/dL   Creatinine, Ser 4.13  0.50 - 1.35 mg/dL   Calcium 9.6  8.4 - 24.4 mg/dL   GFR calc non Af Amer 85 (*) >90 mL/min   GFR calc Af Amer >90  >90 mL/min  MAGNESIUM     Status: Abnormal   Collection Time    09/26/12  6:10 AM      Result Value Range   Magnesium 2.7 (*) 1.5 - 2.5 mg/dL  PHOSPHORUS     Status: None   Collection Time    09/26/12  6:10 AM      Result Value Range   Phosphorus 3.8  2.3 - 4.6 mg/dL  TRIGLYCERIDES     Status: None   Collection Time    09/26/12  6:10 AM      Result Value Range   Triglycerides 137  <150 mg/dL   Dg Chest Port 1 View  09/25/2012  *RADIOLOGY REPORT*  Clinical Data: Evaluate endotracheal tube placement.  PORTABLE CHEST - 1 VIEW  Comparison: Chest x-ray 09/24/2012.  Findings: Previously noted endotracheal tube has been removed. Lung volumes are low.  There are patchy interstitial opacities throughout the entire left lung.  Right lung appears clear. Pulmonary vasculature is within normal limits allowing for low lung volumes.  Blunting of the left costophrenic sulcus may suggest a small left pleural effusion.  Heart  size is within normal limits. The patient is rotated to the  left on today's exam, resulting in distortion of the mediastinal contours and reduced diagnostic sensitivity and specificity for mediastinal pathology. Atherosclerosis in the thoracic aorta.  Multiple old healed right- sided upper posterior rib fractures are incidentally noted.  IMPRESSION: 1.  The patient has been extubated. 2.  Increased asymmetric interstitial prominence throughout the entire left lung is unusual, and most concerning for acute infection.  There is also likely a small left-sided pleural effusion. Attention on follow-up studies is recommended. 3.  Atherosclerosis.   Original Report Authenticated By: Trudie Reed, M.D.    Dg Abd Portable 1v  09/26/2012  *RADIOLOGY REPORT*  Clinical Data: NG tube placement.  PORTABLE ABDOMEN - 1 VIEW  Comparison: One-view abdomen 09/25/2012.  Findings: The NG tube has been advanced.  The side port of the NG tube is at the GE junction.  This could be advanced to 3 cm for more optimal positioning.  The bowel gas pattern is unremarkable. Degenerative changes are again noted in the lumbar spine.  IMPRESSION:  1.  Side port of the NG tube is at the GE junction and could be advanced for more optimal positioning.   Original Report Authenticated By: Marin Roberts, M.D.    Dg Abd Portable 1v  09/25/2012  *RADIOLOGY REPORT*  Clinical Data: Nasogastric tube adjustment  PORTABLE ABDOMEN - 1 VIEW  Comparison: 09/25/2012 at to 04/03 9:00 p.m.  Findings: Nasogastric tube appears to have been pulled back, with tip at the GE junction.  This may predispose to aspiration.  Bowel gas pattern is normal.  No change otherwise.  IMPRESSION: Tip of nasogastric tube appears to be at the GE junction, which may predispose to aspiration.  Recommend advancing approximately 10 cm for more optimal positioning.   Original Report Authenticated By: Christiana Pellant, M.D.    Dg Abd Portable 1v  09/25/2012  *RADIOLOGY REPORT*   Clinical Data: Nasogastric tube replacement verification  PORTABLE ABDOMEN - 1 VIEW  Comparison: Prior abdominal radiograph 09/20/2012  Findings: The tip of the nasogastric tube projects over the gastric body.  The proximal side holes in the region of the gastroesophageal junction.  Stable enlargement the cardiopericardial silhouette.  Lung bases are relatively clear. The bowel gas pattern is unremarkable on this single view.  Lower lumbar facet arthropathy noted.  IMPRESSION: The tip of the nasogastric tube projects over the gastric bubble while the proximal side hole is within the GE junction.  Consider advancing 5 cm for more optimal positioning.   Original Report Authenticated By: Malachy Moan, M.D.     Assessment/Plan: Diagnosis: Right subdural hematoma with fluctuating level of alertness, severe cognitive deficits, balance disorder, severe despite she 1. Does the need for close, 24 hr/day medical supervision in concert with the patient's rehab needs make it unreasonable for this patient to be served in a less intensive setting? Potentially 2. Co-Morbidities requiring supervision/potential complications: Diabetes, postop respiratory failure 3. Due to bladder management, bowel management, safety, skin/wound care, disease management, medication administration, pain management and patient education, does the patient require 24 hr/day rehab nursing? Potentially 4. Does the patient require coordinated care of a physician, rehab nurse, PT (1-2 hrs/day, 5 days/week), OT (1-2 hrs/day, 5 days/week) and SLP (0.5-1 hrs/day, 5 days/week) to address physical and functional deficits in the context of the above medical diagnosis(es)? Potentially Addressing deficits in the following areas: balance, endurance, locomotion, strength, transferring, bowel/bladder control, bathing, dressing, feeding, grooming, toileting, cognition, speech, language, swallowing and psychosocial support 5. Can the patient actively  participate in an  intensive therapy program of at least 3 hrs of therapy per day at least 5 days per week? Potentially 6. The potential for patient to make measurable gains while on inpatient rehab is good 7. Anticipated functional outcomes upon discharge from inpatient rehab are Supervision for mobility with PT, Supervision for ADLs with OT, 100% orientation, give accurate biographical information with SLP. 8. Estimated rehab length of stay to reach the above functional goals is: 2 weeks 9. Does the patient have adequate social supports to accommodate these discharge functional goals? Potentially 10. Anticipated D/C setting: Home 11. Anticipated post D/C treatments: Outpt therapy 12. Overall Rehab/Functional Prognosis: good  RECOMMENDATIONS: This patient's condition is appropriate for continued rehabilitative care in the following setting: Ready for CIR Once mental status allows him to participate in PT and OT Consistently Patient has agreed to participate in recommended program. Potentially Note that insurance prior authorization may be required for reimbursement for recommended care.  Comment:Patient with poor awareness of deficits    09/26/2012

## 2012-09-26 NOTE — Progress Notes (Signed)
Subjective: Patient reports sleepy - arouses , no c/o  - ambulated with assistance  Objective: Vital signs in last 24 hours: Temp:  [97 F (36.1 C)-98.2 F (36.8 C)] 97.9 F (36.6 C) (04/11 0419) Pulse Rate:  [58-92] 58 (04/11 0700) Resp:  [13-23] 15 (04/11 0700) BP: (110-154)/(61-97) 154/85 mmHg (04/11 0700) SpO2:  [84 %-100 %] 99 % (04/11 0700) Weight:  [75.2 kg (165 lb 12.6 oz)] 75.2 kg (165 lb 12.6 oz) (04/11 0300)  Intake/Output from previous day: 04/10 0701 - 04/11 0700 In: 1279 [NG/GT:320; IV Piggyback:859] Out: 1850 [Urine:1850] Intake/Output this shift: Total I/O In: 55 [NG/GT:55] Out: -   easily arouses  - Ox3  - FC all 4 -  left side weakness is present  Lab Results:  Recent Labs  09/25/12 0335 09/26/12 0610  WBC 9.7 12.2*  HGB 13.8 14.5  HCT 40.6 42.2  PLT 347 383   BMET  Recent Labs  09/25/12 0335 09/26/12 0610  NA 135 138  K 3.5 4.0  CL 96 100  CO2 27 30  GLUCOSE 132* 154*  BUN 26* 30*  CREATININE 0.73 0.79  CALCIUM 9.0 9.6    Studies/Results: Dg Chest Port 1 View  09/25/2012  *RADIOLOGY REPORT*  Clinical Data: Evaluate endotracheal tube placement.  PORTABLE CHEST - 1 VIEW  Comparison: Chest x-ray 09/24/2012.  Findings: Previously noted endotracheal tube has been removed. Lung volumes are low.  There are patchy interstitial opacities throughout the entire left lung.  Right lung appears clear. Pulmonary vasculature is within normal limits allowing for low lung volumes.  Blunting of the left costophrenic sulcus may suggest a small left pleural effusion.  Heart size is within normal limits. The patient is rotated to the left on today's exam, resulting in distortion of the mediastinal contours and reduced diagnostic sensitivity and specificity for mediastinal pathology. Atherosclerosis in the thoracic aorta.  Multiple old healed right- sided upper posterior rib fractures are incidentally noted.  IMPRESSION: 1.  The patient has been extubated. 2.   Increased asymmetric interstitial prominence throughout the entire left lung is unusual, and most concerning for acute infection.  There is also likely a small left-sided pleural effusion. Attention on follow-up studies is recommended. 3.  Atherosclerosis.   Original Report Authenticated By: Trudie Reed, M.D.    Dg Abd Portable 1v  09/26/2012  *RADIOLOGY REPORT*  Clinical Data: NG tube placement.  PORTABLE ABDOMEN - 1 VIEW  Comparison: One-view abdomen 09/25/2012.  Findings: The NG tube has been advanced.  The side port of the NG tube is at the GE junction.  This could be advanced to 3 cm for more optimal positioning.  The bowel gas pattern is unremarkable. Degenerative changes are again noted in the lumbar spine.  IMPRESSION:  1.  Side port of the NG tube is at the GE junction and could be advanced for more optimal positioning.   Original Report Authenticated By: Marin Roberts, M.D.    Dg Abd Portable 1v  09/25/2012  *RADIOLOGY REPORT*  Clinical Data: Nasogastric tube adjustment  PORTABLE ABDOMEN - 1 VIEW  Comparison: 09/25/2012 at to 04/03 9:00 p.m.  Findings: Nasogastric tube appears to have been pulled back, with tip at the GE junction.  This may predispose to aspiration.  Bowel gas pattern is normal.  No change otherwise.  IMPRESSION: Tip of nasogastric tube appears to be at the GE junction, which may predispose to aspiration.  Recommend advancing approximately 10 cm for more optimal positioning.   Original Report Authenticated  By: Christiana Pellant, M.D.    Dg Abd Portable 1v  09/25/2012  *RADIOLOGY REPORT*  Clinical Data: Nasogastric tube replacement verification  PORTABLE ABDOMEN - 1 VIEW  Comparison: Prior abdominal radiograph 09/20/2012  Findings: The tip of the nasogastric tube projects over the gastric body.  The proximal side holes in the region of the gastroesophageal junction.  Stable enlargement the cardiopericardial silhouette.  Lung bases are relatively clear. The bowel gas pattern  is unremarkable on this single view.  Lower lumbar facet arthropathy noted.  IMPRESSION: The tip of the nasogastric tube projects over the gastric bubble while the proximal side hole is within the GE junction.  Consider advancing 5 cm for more optimal positioning.   Original Report Authenticated By: Malachy Moan, M.D.     Assessment/Plan: Cont increasing activity  - work with Tx's   - watch in ICU another day  - consult rehab   LOS: 8 days     Annalaya Wile R, MD 09/26/2012, 8:07 AM

## 2012-09-26 NOTE — Evaluation (Signed)
Occupational Therapy Evaluation Patient Details Name: Gerald Leach MRN: [id number] DOB: May 27, 1934 Today's Date: 09/26/2012 Time: 7829-5621 OT Time Calculation (min): 30 min  OT Assessment / Plan / Recommendation Clinical Impression  79 yo male admitted large right subdural hematoma. Has acute respiratory failure post subdural evacuation along with severe hypertension. Pt with Rt craniotomy Ot to follow acutely. Recommend CIR for d/c planning    OT Assessment  Patient needs continued OT Services    Follow Up Recommendations  CIR    Barriers to Discharge      Equipment Recommendations  3 in 1 bedside comode;Other (comment) (RW)    Recommendations for Other Services Rehab consult  Frequency  Min 3X/week    Precautions / Restrictions Precautions Precautions: Fall Restrictions Weight Bearing Restrictions: No   Pertinent Vitals/Pain None reported    ADL  Grooming: Wash/dry hands;Moderate assistance Where Assessed - Grooming: Supported standing Lower Body Dressing: Moderate assistance Where Assessed - Lower Body Dressing: Supported standing Toilet Transfer: Moderate assistance Toilet Transfer Method: Sit to stand Toilet Transfer Equipment: Raised toilet seat with arms (or 3-in-1 over toilet) Toileting - Clothing Manipulation and Hygiene: Moderate assistance Where Assessed - Toileting Clothing Manipulation and Hygiene: Sit to stand from 3-in-1 or toilet Equipment Used: Gait belt;Rolling walker Transfers/Ambulation Related to ADLs: Pt ambulating with RW pushed too far in advance. Pt needed constant v/c. Pt unware of lines and leads ADL Comments: Pt requesting to void bowels on arrival. Pt states he does not need Occupational therapy any more. Pt assumed the definition of an OT was related to returning to work. Pt and family educated on the purpose of OT. Pt while ambulating demonstrates Lt knee flexion and progressed to bil knee flexion due to fatigue.     OT Diagnosis:  Generalized weakness;Cognitive deficits  OT Problem List: Decreased strength;Decreased activity tolerance;Impaired balance (sitting and/or standing);Decreased coordination;Decreased safety awareness;Decreased cognition;Decreased knowledge of use of DME or AE;Decreased knowledge of precautions;Pain OT Treatment Interventions: Self-care/ADL training;Neuromuscular education;DME and/or AE instruction;Therapeutic activities;Cognitive remediation/compensation;Patient/family education;Balance training   OT Goals Acute Rehab OT Goals OT Goal Formulation: With patient Time For Goal Achievement: 10/10/12 Potential to Achieve Goals: Good ADL Goals Pt Will Perform Grooming: with supervision;Standing at sink;Supported ADL Goal: Grooming - Progress: Goal set today Pt Will Perform Upper Body Bathing: with supervision;Sitting, chair;Supported ADL Goal: Upper Body Bathing - Progress: Goal set today Pt Will Perform Upper Body Dressing: Sitting, chair;Supported;with min assist ADL Goal: Upper Body Dressing - Progress: Goal set today Pt Will Perform Lower Body Dressing: with min assist;Sit to stand from chair;Supported ADL Goal: Lower Body Dressing - Progress: Goal set today Pt Will Transfer to Toilet: with supervision;Ambulation;3-in-1 ADL Goal: Toilet Transfer - Progress: Goal set today Pt Will Perform Toileting - Hygiene: with supervision;Sit to stand from 3-in-1/toilet ADL Goal: Toileting - Hygiene - Progress: Goal set today Miscellaneous OT Goals Miscellaneous OT Goal #1: Pt will complete a two step task for adls without cueing supervision level OT Goal: Miscellaneous Goal #1 - Progress: Goal set today  Visit Information  Last OT Received On: 09/26/12 Assistance Needed: +2    Subjective Data  Subjective: "This automatic pisser is the best thing they have up here" Patient Stated Goal: to leave with wife   Prior Functioning     Home Living Lives With: Spouse Available Help at Discharge:  Family;Available 24 hours/day;Other (Comment) Type of Home: House Home Access: Level entry Home Layout: Multi-level Alternate Level Stairs-Number of Steps: 7 Alternate Level Stairs-Rails: None Bathroom Shower/Tub:  Tub/shower unit Bathroom Toilet: Standard Home Adaptive Equipment: Straight cane Additional Comments: Pt fell 6 months ago and has used cane since that time but son states no real balance deficits prior to this admit Prior Function Level of Independence: Independent with assistive device(s) Able to Take Stairs?: Yes Driving: Yes Vocation: Retired Musician: No difficulties Dominant Hand: Right         Vision/Perception Vision - History Patient Visual Report: Other (comment) (cues to locate paper towel to left) Vision - Assessment Vision Assessment: Vision not tested Additional Comments: further assessment of vision to be completed   Cognition  Cognition Overall Cognitive Status: Impaired Area of Impairment: Awareness of deficits;Safety/judgement;Following commands Arousal/Alertness: Awake/alert Orientation Level: Appears intact for tasks assessed Behavior During Session: [facility] for tasks performed Current Attention Level: Sustained Following Commands: Follows multi-step commands inconsistently;Follows one step commands with increased time Safety/Judgement: Decreased safety judgement for tasks assessed;Impulsive Safety/Judgement - Other Comments: mod/max cues throughout session for safety with walker    Extremity/Trunk Assessment Right Upper Extremity Assessment RUE ROM/Strength/Tone: Within functional levels RUE Sensation: WFL - Light Touch RUE Coordination: WFL - gross/fine motor Left Upper Extremity Assessment LUE ROM/Strength/Tone: Within functional levels LUE Sensation: WFL - Light Touch LUE Coordination: WFL - gross/fine motor     Mobility Bed Mobility Bed Mobility: Supine to Sit;Sit to Supine Supine to Sit: 4: Min assist;HOB  elevated Sitting - Scoot to Edge of Bed: 5: Supervision Sit to Supine: 5: Supervision;HOB flat Transfers Sit to Stand: 4: Min assist;From bed;From chair/3-in-1;With upper extremity assist Stand to Sit: 3: Mod assist;With armrests;To chair/3-in-1;To bed Details for Transfer Assistance: max cues for safety backing up to toilet and to bed, tends to pull up on walker     Exercise     Balance Dynamic Standing Balance Dynamic Standing - Balance Support: During functional activity;No upper extremity supported Dynamic Standing - Level of Assistance: 3: Mod assist Dynamic Standing - Comments: washing hands at sink with OT   End of Session OT - End of Session Activity Tolerance: Patient tolerated treatment well Patient left: in bed;with call bell/phone within reach;with family/visitor present Nurse Communication: Mobility status;Precautions  GO     Lucile Shutters 09/26/2012, 1:35 PM Pager: [phone]

## 2012-09-26 NOTE — Progress Notes (Addendum)
PULMONARY  / CRITICAL CARE MEDICINE  Name: Gerald Leach MRN: [id number] DOB: Mar 31, 1934    ADMISSION DATE:  09/18/2012 CONSULTATION DATE: 09/26/2012   REFERRING MD :  Dr Phoebe Perch PRIMARY SERVICE: Neurosurgery   CHIEF COMPLAINT:  Acute encephalopathy  BRIEF PATIENT DESCRIPTION: 79 year old man, with past medical history of hypertension, and hypothyroidism, admitted with a large right subdural hematoma. Has acute respiratory failure post subdural evacuation along with severe hypertension.   SIGNIFICANT EVENTS / STUDIES:  Head CT scan-right large subdural hematoma with 1.44 cm midline shift. 4/4 > Craniotomy/hematoma evacuation 4/4 extubation postop  LINES / TUBES: 09/19/2012 - ETT.>>4/4   CULTURES: None  ANTIBIOTICS: IV Ancef periop d/ced on 4/10.  SUBJECTIVE:   Confused, lethargic, coughs to command  VITAL SIGNS: Temp:  [97 F (36.1 C)-98.2 F (36.8 C)] 97.9 F (36.6 C) (04/11 0419) Pulse Rate:  [58-92] 65 (04/11 0902) Resp:  [13-23] 16 (04/11 0800) BP: (110-154)/(61-97) 116/71 mmHg (04/11 0800) SpO2:  [84 %-100 %] 98 % (04/11 0800) Weight:  [75.2 kg (165 lb 12.6 oz)] 75.2 kg (165 lb 12.6 oz) (04/11 0300) HEMODYNAMICS: CV stable   VENTILATOR SETTINGS: Extubated to RA 4/4.  4/5 on RA sats 98-100%   INTAKE / OUTPUT: Intake/Output     04/10 0701 - 04/11 0700 04/11 0701 - 04/12 0700   I.V. (mL/kg)     Other 100    NG/GT 320 55   IV Piggyback 859    Total Intake(mL/kg) 1279 (17) 55 (0.7)   Urine (mL/kg/hr) 1850 (1)    Drains     Total Output 1850     Net -571 +55        Urine Occurrence 1 x    Stool Occurrence 1 x     PHYSICAL EXAMINATION: General: Lethargic but arousable and recognizes family, no distress, tracks and follows simple commands.  Able to cough to command but weak cough. Neuro: Lethargic.  None focal. HEENT: Right craniotomy without active bleeding. NGT in place. Cardiovascular:  Heart sounds WNL, regular and normal.  No added sounds. No  murmurs. Lungs: Very rhonchus no accessory muscle use.  Upper airway sounds. Abdomen: Nondistended, nontender, normal bowel sounds. No palpable masses. Musculoskeletal: Unremarkable  Skin: No rashes.  Recent Labs Lab 09/24/12 0515 09/25/12 0335 09/26/12 0610  NA 136 135 138  K 3.9 3.5 4.0  CL 96 96 100  CO2 25 27 30   BUN 20 26* 30*  CREATININE 0.71 0.73 0.79  GLUCOSE 144* 132* 154*   Recent Labs Lab 09/24/12 0515 09/25/12 0335 09/26/12 0610  HGB 13.9 13.8 14.5  HCT 40.5 40.6 42.2  WBC 11.3* 9.7 12.2*  PLT 354 347 383   Recent Labs Lab 09/25/12 1604 09/25/12 1932 09/26/12 0042 09/26/12 0307 09/26/12 0741  GLUCAP 115* 112* 120* 129* 134*   CXR No film 4/6  ASSESSMENT / PLAN: Principal Problem:   Subdural hematoma Active Problems:   HYPOTHYROIDISM   HYPERTENSION   Diabetes mellitus type II, uncontrolled   Respiratory failure, post-operative   S/P craniotomy   Dysphagia  NEUROLOGIC A:   SDH, s/p craniotomy and hematoma evacuation, Significant mass effect with 14 mm right to left midline shift and anterior subfalcine herniation   P:   - Per NSGY, back to the OR 4/7, now more interactive. - Continue with Keppra as per neurosurgery. - Minimize sedation.  PULMONARY A:  Post surgery respiratory failure. S/p extubation 4/4, reintubated for OR visit on 4/7. P:   - Extubated but airway  protection remains questionable, spoke with family extensively, they are pro the idea of trach/peg if needs be but they would not want that if indefinitely.  Only if there is a chance for improvement, will keep full code for now. - TF via NGT.  CARDIOVASCULAR A:HTN (better control as of 4/5 and 4/6) P:  - IV Hydralazine given bradycardia.  - Goal SBP < 160 per neurosurgery - Hold beta blockers for today.  RENAL: No acute issues. Forming urine.  P:   - Lasix 40 mg IV q8 x2. - KCl PO x2 doses. - BMET daily.  GASTROINTESTINAL A:  Failed swallow eval post extubation.  Not  able to protect airway P:   - Continue tube feeds.  HEMATOLOGIC A: No acute issues. No active bleeding. Drain with 25 cc of blood P:  - CBC in AM. - SCDs for DVT proph, cannot get hep/lmwh.  INFECTIOUS A: No active issues, mild leukocytosis P:   - No intervention. - CBC in AM.  ENDOCRINE A:  Mild hyperglycemia, hypothyroidism   P:   - SSI. - Continue with synthroid.  TODAY'S SUMMARY: 79 year old man, with past medical history of hypertension, and hypothyroidism, admitted with a large right subdural hematoma. Had acute respiratory failure post subdural evacuation along with severe hypertension post op now better 09/20/12. Now extubated 4/4.  Reintubated on 4/7 and extubated on 4/9, airway protection still a major concern, will hold in ICU given concern for airway protection, remains high risk for intubation.  I have personally obtained a history, examined the patient, evaluated laboratory and imaging results, formulated the assessment and plan and placed orders.  CRITICAL CARE: The patient is critically ill with multiple organ systems failure and requires high complexity decision making for assessment and support, frequent evaluation and titration of therapies, application of advanced monitoring technologies and extensive interpretation of multiple databases. Critical Care Time devoted to patient care services described in this note is 35 minutes.   Alyson Reedy, M.D. [facility] Pulmonary/Critical Care Medicine. Pager: [phone]. After hours pager: [phone].

## 2012-09-26 NOTE — Progress Notes (Signed)
Requested by RN to observe patient with pm meal due to pocketing of solid bolus. This SLP did observe patient pocketing bolus requiring eventual manual removal by RN. Per RN, patient did well consuming pureed solid. Will downgrade to dysphagia 1 (puree) with thin liquids at this time, no straws to decrease aspiration risk. SLP will f/u.  Ferdinand Lango MA, CCC-SLP [phone]

## 2012-09-26 NOTE — Progress Notes (Signed)
I met with pt's wife and daughter at bedside. Patient down for MBS. Introduced rehab venue options of CIR vs SNF depending how pt progresses over the next several days. Wife with recent back surgery and using a cane. Daughter and son can assist intermittently. I will follow . [phone]

## 2012-09-27 ENCOUNTER — Inpatient Hospital Stay (HOSPITAL_COMMUNITY): Payer: Medicare Other

## 2012-09-27 LAB — BASIC METABOLIC PANEL
BUN: 26 mg/dL — ABNORMAL HIGH (ref 6–23)
CO2: 30 mEq/L (ref 19–32)
GFR calc non Af Amer: 88 mL/min — ABNORMAL LOW (ref >90)
Glucose, Bld: 143 mg/dL — ABNORMAL HIGH (ref 70–99)
Potassium: 3.9 mEq/L (ref 3.5–5.1)

## 2012-09-27 LAB — CBC
HCT: 41.5 % (ref 39.0–52.0)
Hemoglobin: 13.8 g/dL (ref 13.0–17.0)
MCHC: 33.3 g/dL (ref 30.0–36.0)
RBC: 5.1 MIL/uL (ref 4.22–5.81)

## 2012-09-27 LAB — GLUCOSE, CAPILLARY
Glucose-Capillary: 100 mg/dL — ABNORMAL HIGH (ref 70–99)
Glucose-Capillary: 119 mg/dL — ABNORMAL HIGH (ref 70–99)
Glucose-Capillary: 130 mg/dL — ABNORMAL HIGH (ref 70–99)

## 2012-09-27 LAB — PHOSPHORUS: Phosphorus: 3.4 mg/dL (ref 2.3–4.6)

## 2012-09-27 MED ORDER — BIOTENE DRY MOUTH MT LIQD
15.0000 mL | Freq: Two times a day (BID) | OROMUCOSAL | Status: DC
Start: 2012-09-27 — End: 2012-09-30
  Administered 2012-09-27 – 2012-09-30 (×7): 15 mL via OROMUCOSAL

## 2012-09-27 MED ORDER — INSULIN ASPART 100 UNIT/ML ~~LOC~~ SOLN
0.0000 [IU] | Freq: Three times a day (TID) | SUBCUTANEOUS | Status: DC
  Administered 2012-09-27 – 2012-09-29 (×3): 5 [IU] via SUBCUTANEOUS
  Administered 2012-09-30: 2 [IU] via SUBCUTANEOUS
  Administered 2012-09-30: 3 [IU] via SUBCUTANEOUS

## 2012-09-27 MED ORDER — LEVETIRACETAM 500 MG PO TABS
500.0000 mg | ORAL_TABLET | Freq: Two times a day (BID) | ORAL | Status: DC
  Administered 2012-09-27 – 2012-09-30 (×7): 500 mg via ORAL
  Filled 2012-09-27 (×8): qty 1

## 2012-09-27 MED ORDER — INSULIN ASPART 100 UNIT/ML ~~LOC~~ SOLN: 0.0000 [IU] | Freq: Three times a day (TID) | SUBCUTANEOUS | Status: DC

## 2012-09-27 NOTE — Evaluation (Signed)
Speech Language Pathology Evaluation Patient Details Name: Gerald Leach MRN: [id number] DOB: 11/21/1933 Today's Date: 09/27/2012 Time: 4098-1191 SLP Time Calculation (min): 35 min  Problem List:  Patient Active Problem List  Diagnosis  . CARCINOMA, SKIN, SQUAMOUS CELL, FACE  . COLONIC POLYPS  . HYPOTHYROIDISM  . HYPERLIPIDEMIA  . HYPERTENSION  . GERD  . SOLAR KERATOSIS  . CARCINOMA, SKIN, SQUAMOUS CELL, FACE  . Diabetes mellitus type II, uncontrolled  . Facial laceration  . Contusion, chest wall  . Subdural hematoma  . Respiratory failure, post-operative  . S/P craniotomy  . Dysphagia   Past Medical History:  Past Medical History  Diagnosis Date  . Hypertension   . Colonic polyp 2003  . GERD (gastroesophageal reflux disease)   . ED (erectile dysfunction)   . Hyperlipidemia   . Hypothyroidism   . Brain aneurysm    Past Surgical History:  Past Surgical History  Procedure Laterality Date  . Brain aneurysm surgery      at [facility]  . Carotid artery aneurysm    . Colonoscopy      polyps  . Right hernia    . Craniotomy Right 09/19/2012    Procedure: CRANIOTOMY HEMATOMA EVACUATION SUBDURAL;  Surgeon: Clydene Fake, MD;  Location: [facility]UR[facility];  Service: Neurosurgery;  Laterality: Right;  . Craniotomy Right 09/22/2012    Procedure: CRANIOTOMY HEMATOMA EVACUATION SUBDURAL;  Surgeon: Clydene Fake, MD;  Location: [facility] NEUR[facility] OR[facility];  Service: Neurosurgery;  Laterality: Right;   HPI:  79 year old man, with past medical history of hypertension, and hypothyroidism, admited with a large right subdural hematoma. Family reports multiple falls prior to admit, one of which was down concrete stairs. Pt intubated for procedure 4/4, extubated at 12 pm. Reintubatd 4/7 for craniotomy, extubated 4/9.    Assessment / Plan / Recommendation Clinical Impression  Pt demonstrates TBI with cognitive function consistent with a Rancho Level VII (Automatic Appropriate Response). Pt is able to  participate in familiar functional tasks and higher level reasoning. However, moderate cognitive deficits include impaired selective attention, poor awareness of deficits, impaired executive function impacting self monitoring, decision making and safety. Pt is mildly dysarthric. He would benefit from continued SLP services to address executive function for appropriate safety awareness.  Recommend CIR at d/c.     SLP Assessment  Patient needs continued Speech Lanaguage Pathology Services    Follow Up Recommendations       Frequency and Duration min 2x/week  2 weeks   Pertinent Vitals/Pain NA   SLP Goals  SLP Goals Potential to Achieve Goals: Good Progress/Goals/Alternative treatment plan discussed with pt/caregiver and they: Agree SLP Goal #1: Pt will maintain eye contact during 5 minute conversation in moderately distracting environment with min verbal cues. SLP Goal #2: Pt will verbalize awareness of 2 cognitive defictis and 2 physical deficits with moderate verbal cues.  SLP Goal #3: Pt will verbalize safety precautions with moderate verbal cues x3.   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)   Cognition  Overall Cognitive Status: Impaired Arousal/Alertness: Awake/alert Orientation Level: Oriented to person;Oriented to place;Oriented to time;Oriented to situation Attention: Focused;Sustained;Selective Focused Attention: Appears intact Sustained Attention: Appears intact Selective Attention: Impaired Selective Attention Impairment: Verbal complex;Functional complex Memory: Impaired Memory Impairment: Prospective memory (100%  accuracy with words, doesnt recall safety precautions) Awareness:  Impaired Awareness Impairment: Intellectual impairment;Emergent impairment;Anticipatory impairment Problem Solving: Appears intact Executive Function: Reasoning;Sequencing;Organizing;Decision Making;Self Monitoring;Self Correcting Reasoning: Appears intact Sequencing: Appears intact Organizing: Appears intact Decision Making: Impaired Decision Making Impairment: Verbal complex;Functional basic Self Monitoring: Impaired Self Monitoring Impairment: Verbal complex;Functional basic Self Correcting: Impaired Self Correcting Impairment: Verbal complex;Functional basic Behaviors: Impulsive Safety/Judgment: Impaired    Comprehension  Auditory Comprehension Overall Auditory Comprehension: Appears within functional limits for tasks assessed    Expression Verbal Expression Overall Verbal Expression: Appears within functional limits for tasks assessed   Oral / Motor Oral Motor/Sensory Function Overall Oral Motor/Sensory Function: Impaired Labial ROM: Reduced left Labial Symmetry: Abnormal symmetry left Labial Strength: Reduced Labial Sensation: Reduced Lingual ROM: Reduced left Lingual Symmetry: Abnormal symmetry left Lingual Strength: Reduced Lingual Sensation: Reduced Facial ROM: Reduced left Facial Symmetry: Left droop Facial Strength: Reduced Facial Sensation: Reduced Velum: Within Functional Limits Mandible: Within Functional Limits Motor Speech Overall Motor Speech: Impaired Respiration: Within functional limits Phonation: Normal Resonance: Within functional limits Articulation: Impaired Level of Impairment: Conversation Intelligibility: Intelligibility reduced Word: 75-100% accurate Phrase: 75-100% accurate Sentence: 75-100% accurate Conversation: 75-100% accurate   GO    Harlon Ditty, MA CCC-SLP [phone]  Claudine Mouton 09/27/2012, 12:44 PM

## 2012-09-27 NOTE — Progress Notes (Signed)
Subjective: Patient reports Offers no complaints. Patient notes this is the first time he has had ice cream with breakfast.  Objective: Vital signs in last 24 hours: Temp:  [97.2 F (36.2 C)-98.2 F (36.8 C)] 97.7 F (36.5 C) (04/12 0800) Pulse Rate:  [34-120] 57 (04/12 0900) Resp:  [12-33] 16 (04/12 0900) BP: (102-158)/(22-95) 119/79 mmHg (04/12 0900) SpO2:  [91 %-100 %] 98 % (04/12 0900)  Intake/Output from previous day: 04/11 0701 - 04/12 0700 In: 735 [P.O.:360; NG/GT:165; IV Piggyback:210] Out: 1150 [Urine:1150] Intake/Output this shift: Total I/O In: 260 [P.O.:240; Other:20] Out: 300 [Urine:300]  Incision is clean and dry motor function is good slight left facial noted.  Lab Results:  Recent Labs  09/26/12 0610 09/27/12 0500  WBC 12.2* 10.8*  HGB 14.5 13.8  HCT 42.2 41.5  PLT 383 387   BMET  Recent Labs  09/26/12 0610 09/27/12 0500  NA 138 136  K 4.0 3.9  CL 100 96  CO2 30 30  GLUCOSE 154* 143*  BUN 30* 26*  CREATININE 0.79 0.72  CALCIUM 9.6 9.3    Studies/Results: Dg Chest Port 1 View  09/27/2012  *RADIOLOGY REPORT*  Clinical Data: Respiratory failure.  PORTABLE CHEST - 1 VIEW  Comparison: 09/25/2012  Findings: High density material in the left upper abdomen is suggestive for contrast within the bowel.  The patient has old left rib fractures.  Lungs are clear without airspace disease or edema. Heart and mediastinum are within normal limits.  Old right upper rib fractures.  IMPRESSION: No acute cardiopulmonary disease.  Old bilateral rib fractures.   Original Report Authenticated By: Richarda Overlie, M.D.    Dg Abd Portable 1v  09/26/2012  *RADIOLOGY REPORT*  Clinical Data: NG tube placement.  PORTABLE ABDOMEN - 1 VIEW  Comparison: One-view abdomen 09/25/2012.  Findings: The NG tube has been advanced.  The side port of the NG tube is at the GE junction.  This could be advanced to 3 cm for more optimal positioning.  The bowel gas pattern is unremarkable.  Degenerative changes are again noted in the lumbar spine.  IMPRESSION:  1.  Side port of the NG tube is at the GE junction and could be advanced for more optimal positioning.   Original Report Authenticated By: Marin Roberts, M.D.    Dg Abd Portable 1v  09/25/2012  *RADIOLOGY REPORT*  Clinical Data: Nasogastric tube adjustment  PORTABLE ABDOMEN - 1 VIEW  Comparison: 09/25/2012 at to 04/03 9:00 p.m.  Findings: Nasogastric tube appears to have been pulled back, with tip at the GE junction.  This may predispose to aspiration.  Bowel gas pattern is normal.  No change otherwise.  IMPRESSION: Tip of nasogastric tube appears to be at the GE junction, which may predispose to aspiration.  Recommend advancing approximately 10 cm for more optimal positioning.   Original Report Authenticated By: Christiana Pellant, M.D.    Dg Abd Portable 1v  09/25/2012  *RADIOLOGY REPORT*  Clinical Data: Nasogastric tube replacement verification  PORTABLE ABDOMEN - 1 VIEW  Comparison: Prior abdominal radiograph 09/20/2012  Findings: The tip of the nasogastric tube projects over the gastric body.  The proximal side holes in the region of the gastroesophageal junction.  Stable enlargement the cardiopericardial silhouette.  Lung bases are relatively clear. The bowel gas pattern is unremarkable on this single view.  Lower lumbar facet arthropathy noted.  IMPRESSION: The tip of the nasogastric tube projects over the gastric bubble while the proximal side hole is within the GE  junction.  Consider advancing 5 cm for more optimal positioning.   Original Report Authenticated By: Malachy Moan, M.D.    Dg Swallowing Func-speech Pathology  09/26/2012  Vivi Ferns McCoy, CCC-SLP     09/26/2012  1:47 PM Objective Swallowing Evaluation: Modified Barium Swallowing Study   Patient Details  Name: Gerald Leach MRN: [id number] Date of Birth: 11-02-1933  Today's Date: 09/26/2012 Time: 1100-1130 SLP Time Calculation (min): 30 min  Past Medical History:   Past Medical History  Diagnosis Date  . Hypertension   . Colonic polyp 2003  . GERD (gastroesophageal reflux disease)   . ED (erectile dysfunction)   . Hyperlipidemia   . Hypothyroidism   . Brain aneurysm    Past Surgical History:  Past Surgical History  Procedure Laterality Date  . Brain aneurysm surgery      at [facility]  . Carotid artery aneurysm    . Colonoscopy      polyps  . Right hernia    . Craniotomy Right 09/19/2012    Procedure: CRANIOTOMY HEMATOMA EVACUATION SUBDURAL;  Surgeon:  Clydene Fake, MD;  Location: [facility];  Service:  Neurosurgery;  Laterality: Right;  . Craniotomy Right 09/22/2012    Procedure: CRANIOTOMY HEMATOMA EVACUATION SUBDURAL;  Surgeon:  Clydene Fake, MD;  Location: [facility];  Service:  Neurosurgery;  Laterality: Right;   HPI:  79 year old man, with past medical history of hypertension, and  hypothyroidism, admited with a large right subdural hematoma. Pt  intubated for procedure 4/4, extubated at 12 pm. Reintubatd 4/7  for craniotomy, extubated 4/9.      Assessment / Plan / Recommendation Clinical Impression  Dysphagia Diagnosis: Moderate oral phase dysphagia;Mild  pharyngeal phase dysphagia Clinical impression: Patient presents with a moderate oral and a  mild pharyngeal phase dysphagia. Oral weakness combined with AMS  results in decreased bolus cohesion and intermittent anterior  labial spillage of mild oral residuals and mild-moderate  vallecular residuals post swallow (also may be impacted by  presence of large bore NG tube). Intermittent delayed swallow  initiation lead to one episode of trace penetration of thin  liquids which cleared with spontaneous dry swallows. Otherwise,  patient able to fully protect airway during today's study.  Recommend initiation of a dysphagia 2 (chopped) diet, thin  liquids, no straws to decrease risk of aspiration. SLP will f/u  closely for diet tolerance.     Treatment Recommendation  Therapy as outlined in treatment plan below    Diet  Recommendation Dysphagia 2 (Fine chop);Thin liquid   Liquid Administration via: Cup;No straw Medication Administration: Crushed with puree Supervision: Patient able to self feed;Full supervision/cueing  for compensatory strategies Compensations: Slow rate;Small sips/bites;Multiple dry swallows  after each bite/sip Postural Changes and/or Swallow Maneuvers: Seated upright 90  degrees    Other  Recommendations Oral Care Recommendations: Oral care BID   Follow Up Recommendations  Inpatient Rehab    Frequency and Duration min 3x week  2 weeks       SLP Swallow Goals Patient will utilize recommended strategies during swallow to  increase swallowing safety with: Minimal cueing Swallow Study Goal #2 - Progress: Progressing toward goal Goal #3: Pt will sustain attention to trials of thin liquids and  solids with timley oral transit and swallow response with min  verbal cues.  Swallow Study Goal #3 - Progress: Met   General HPI: 79 year old man, with past medical history of  hypertension, and hypothyroidism, admited with a large right  subdural  hematoma. Pt intubated for procedure 4/4, extubated at  12 pm. Reintubatd 4/7 for craniotomy, extubated 4/9.  Type of Study: Modified Barium Swallowing Study Reason for Referral: Objectively evaluate swallowing function Previous Swallow Assessment: none Diet Prior to this Study: NPO Temperature Spikes Noted: No Respiratory Status: Room air History of Recent Intubation: Yes Length of Intubations (days): 1 days Date extubated: 09/19/12 Behavior/Cognition: Alert;Cooperative;Pleasant mood;Requires  cueing;Decreased sustained attention Oral Cavity - Dentition: Adequate natural dentition Oral Motor / Sensory Function: Impaired - see Bedside swallow  eval Self-Feeding Abilities: Able to feed self Patient Positioning: Upright in chair Baseline Vocal Quality: Clear Volitional Cough: Strong Volitional Swallow: Able to elicit Anatomy: Within functional limits Pharyngeal Secretions: Not observed  secondary MBS    Reason for Referral Objectively evaluate swallowing function   Oral Phase Oral Preparation/Oral Phase Oral Phase: Impaired Oral - Nectar Oral - Nectar Teaspoon: Lingual/palatal residue Oral - Nectar Cup: Lingual/palatal residue Oral - Thin Oral - Thin Cup: Lingual/palatal residue;Right anterior bolus  loss;Left anterior bolus loss Oral - Thin Straw: Lingual/palatal residue;Right anterior bolus  loss;Left anterior bolus loss (anterior loss post swallow) Oral - Solids Oral - Puree: Lingual/palatal residue Oral - Mechanical Soft: Lingual/palatal residue   Pharyngeal Phase Pharyngeal Phase Pharyngeal Phase: Impaired Pharyngeal - Nectar Pharyngeal - Nectar Teaspoon: Delayed swallow  initiation;Premature spillage to valleculae;Pharyngeal residue -  valleculae;Reduced tongue base retraction Pharyngeal - Nectar Cup: Delayed swallow initiation;Premature  spillage to valleculae;Pharyngeal residue - valleculae;Reduced  tongue base retraction Pharyngeal - Thin Pharyngeal - Thin Cup: Delayed swallow initiation;Premature  spillage to valleculae;Pharyngeal residue - valleculae;Reduced  tongue base retraction Pharyngeal - Thin Straw: Delayed swallow initiation;Pharyngeal  residue - valleculae;Reduced tongue base retraction;Premature  spillage to pyriform sinuses;Premature spillage to  valleculae;Penetration/Aspiration before swallow Penetration/Aspiration details (thin straw): Material enters  airway, remains ABOVE vocal cords and not ejected out (ejected  with subsequent swallow) Pharyngeal - Solids Pharyngeal - Puree: Delayed swallow initiation;Premature spillage  to valleculae;Pharyngeal residue - valleculae;Reduced tongue base  retraction Pharyngeal - Mechanical Soft: Delayed swallow  initiation;Premature spillage to valleculae;Pharyngeal residue -  valleculae;Reduced tongue base retraction Pharyngeal Phase - Comment Pharyngeal Comment: Delayed swallow initiation;Premature spillage  to valleculae;Pharyngeal  residue - valleculae;Reduced tongue base  retraction  Cervical Esophageal Phase    GO  Ferdinand Lango MA, CCC-SLP [phone]   Cervical Esophageal Phase Cervical Esophageal Phase: [facility]         McCoy Leah Meryl 09/26/2012, 1:47 PM      Assessment/Plan: Stable postop  LOS: 9 days  Transfer to floor   Chellie Vanlue J 09/27/2012, 10:12 AM

## 2012-09-27 NOTE — Progress Notes (Signed)
PULMONARY  / CRITICAL CARE MEDICINE  Name: Gerald Leach MRN: [id number] DOB: 03/07/1934    ADMISSION DATE:  09/18/2012 CONSULTATION DATE: 09/27/2012   REFERRING MD :  Dr Phoebe Perch PRIMARY SERVICE: Neurosurgery   CHIEF COMPLAINT:  Acute encephalopathy  BRIEF PATIENT DESCRIPTION: 79 year old man, with past medical history of hypertension, and hypothyroidism, admitted with a large right subdural hematoma. Has acute respiratory failure post subdural evacuation along with severe hypertension.   SIGNIFICANT EVENTS / STUDIES:  Head CT scan-right large subdural hematoma with 1.44 cm midline shift. 4/4 > Craniotomy/hematoma evacuation 4/4 extubation postop  LINES / TUBES: 09/19/2012 - ETT.>>4/4   CULTURES: None  ANTIBIOTICS: IV Ancef periop d/ced on 4/10.  SUBJECTIVE:   Afebrile Denies pain Able tog et himself oob & stand tot ransfer to chair  VITAL SIGNS: Temp:  [97.2 F (36.2 C)-98.2 F (36.8 C)] 97.7 F (36.5 C) (04/12 0800) Pulse Rate:  [34-120] 55 (04/12 0800) Resp:  [12-33] 33 (04/12 0600) BP: (102-158)/(22-95) 117/46 mmHg (04/12 0800) SpO2:  [91 %-100 %] 97 % (04/12 0800) HEMODYNAMICS: CV stable   VENTILATOR SETTINGS: Extubated to RA 4/4.  4/5 on RA sats 98-100%   INTAKE / OUTPUT: Intake/Output     04/11 0701 - 04/12 0700 04/12 0701 - 04/13 0700   P.O. 360    Other     NG/GT 165    IV Piggyback 210    Total Intake(mL/kg) 735 (9.8)    Urine (mL/kg/hr) 1150 (0.6)    Total Output 1150     Net -415           PHYSICAL EXAMINATION: General:awake,interactive, no distress, tracks and follows simple commands.  Able to cough to command but weak cough. Neuro: awake.  Non focal. HEENT: Right craniotomy without active bleeding. NGT in place. Cardiovascular:  Heart sounds WNL, regular and normal.  No added sounds. No murmurs. Lungs: Very rhonchus no accessory muscle use.  Upper airway sounds. Abdomen: Nondistended, nontender, normal bowel sounds. No palpable  masses. Musculoskeletal: Unremarkable  Skin: No rashes.  Recent Labs Lab 09/25/12 0335 09/26/12 0610 09/27/12 0500  NA 135 138 136  K 3.5 4.0 3.9  CL 96 100 96  CO2 27 30 30   BUN 26* 30* 26*  CREATININE 0.73 0.79 0.72  GLUCOSE 132* 154* 143*    Recent Labs Lab 09/25/12 0335 09/26/12 0610 09/27/12 0500  HGB 13.8 14.5 13.8  HCT 40.6 42.2 41.5  WBC 9.7 12.2* 10.8*  PLT 347 383 387    Recent Labs Lab 09/26/12 1618 09/26/12 1930 09/27/12 0005 09/27/12 0415 09/27/12 0820  GLUCAP 127* 145* 119* 125* 130*     ASSESSMENT / PLAN: Principal Problem:   Subdural hematoma Active Problems:   HYPOTHYROIDISM   HYPERTENSION   Diabetes mellitus type II, uncontrolled   Respiratory failure, post-operative   S/P craniotomy   Dysphagia  NEUROLOGIC A:   SDH, s/p craniotomy and hematoma evacuation    P:   - Per NSGY, back to the OR 4/7 - Continue with Keppra as per neurosurgery. Marland Kitchen  PULMONARY A:  Post surgery respiratory failure. S/p extubation 4/4, reintubated for OR visit on 4/7. P:   -Remains aspiration risk - Extubated,  Per prior d/w family , they are pro the idea of trach/peg if needs be but they would not want that if indefinitely.  Only if there is a chance for improvement, will keep full code for now.   CARDIOVASCULAR A:HTN (better control as of 4/5 and 4/6) P:  -  IV Hydralazine prn  - Goal SBP < 160 per neurosurgery -on atenolol  RENAL: No acute issues. Forming urine.  P:   - Lasix 40 mg IV q8 x2. - KCl PO x2 doses. - BMET daily.  GASTROINTESTINAL A:  dysphagia P:   - dys 1 diet  HEMATOLOGIC A: No acute issues. No active bleeding.  P:  - CBC in AM. - SCDs for DVT proph, cannot get hep/lmwh.    ENDOCRINE A:  Mild hyperglycemia, hypothyroidism   P:   - SSI. - Continue with synthroid.  TODAY'S SUMMARY: 79 year old man, with past medical history of hypertension, and hypothyroidism, admitted with a large right subdural hematoma. Had acute  respiratory failure post subdural evacuation along with severe hypertension post op now better 09/20/12. Now extubated 4/4.  Reintubated on 4/7 and extubated on 4/9, can transfer out of ICU & CIR being planned  PCCM to sign off  Ayodele Sangalang V.  [phone]

## 2012-09-27 NOTE — Progress Notes (Signed)
Speech Language Pathology Dysphagia Treatment Patient Details Name: DAROL CUSH MRN: [id number] DOB: Jan 07, 1934 Today's Date: 09/27/2012 Time: 0102-7253 SLP Time Calculation (min): 35 min  Assessment / Plan / Recommendation Clinical Impression  Pt observed with milkshake and water via cup. Particularly with ice cream, pt with oral residuals, likely spilling to pharynx post swallow resulting in coughing episodes. Given adequate sensation would not alter diet, but continue to recommend dys 1 puree texture given significant residuals in left buccal cavity. Pt needs moderate verbal and contextual cues to recall lingual sweep and wiping mouth. SLP will continue to follow for tolerance.     Diet Recommendation  Continue with Current Diet: Dysphagia 1 (puree);Thin liquid    SLP Plan Continue with current plan of care   Pertinent Vitals/Pain NA   Swallowing Goals  SLP Swallowing Goals Patient will utilize recommended strategies during swallow to increase swallowing safety with: Minimal cueing Swallow Study Goal #2 - Progress: Progressing toward goal  General    Oral Cavity - Oral Hygiene     Dysphagia Treatment Treatment focused on: Skilled observation of diet tolerance;Patient/family/caregiver education;Facilitation of oral phase;Utilization of compensatory strategies Family/Caregiver Educated: wife Treatment Methods/Modalities: Skilled observation;Differential diagnosis Patient observed directly with PO's: Yes Type of PO's observed: Thin liquids Feeding: Able to feed self;Needs assist Liquids provided via: Teaspoon;Cup Oral Phase Signs & Symptoms: Anterior loss/spillage Pharyngeal Phase Signs & Symptoms: Delayed cough Type of cueing: Verbal;Tactile;Visual Amount of cueing: Minimal   GO    Harlon Ditty, MA CCC-SLP [phone]  Claudine Mouton 09/27/2012, 12:49 PM

## 2012-09-28 LAB — GLUCOSE, CAPILLARY
Glucose-Capillary: 223 mg/dL — ABNORMAL HIGH (ref 70–99)
Glucose-Capillary: 170 mg/dL — ABNORMAL HIGH (ref 70–99)

## 2012-09-28 MED ORDER — ALPRAZOLAM 0.25 MG PO TABS
0.2500 mg | ORAL_TABLET | Freq: Two times a day (BID) | ORAL | Status: DC
  Administered 2012-09-28 – 2012-09-30 (×4): 0.25 mg via ORAL
  Filled 2012-09-28 (×4): qty 1

## 2012-09-28 NOTE — Progress Notes (Signed)
Subjective: Patient reports Patient is restless. Once to ambulate and get moving. However is unsteady  Objective: Vital signs in last 24 hours: Temp:  [97.4 F (36.3 C)-99.1 F (37.3 C)] 97.4 F (36.3 C) (04/13 0942) Pulse Rate:  [61-90] 90 (04/13 0942) Resp:  [18-20] 18 (04/13 0942) BP: (110-136)/(70-83) 112/76 mmHg (04/13 0942) SpO2:  [94 %-100 %] 100 % (04/13 0942)  Intake/Output from previous day: 04/12 0701 - 04/13 0700 In: 440 [P.O.:420] Out: 300 [Urine:300] Intake/Output this shift: Total I/O In: 120 [P.O.:120] Out: -   Incision is clean and dry some crusted blood and scalp and hair  Lab Results:  Recent Labs  09/26/12 0610 09/27/12 0500  WBC 12.2* 10.8*  HGB 14.5 13.8  HCT 42.2 41.5  PLT 383 387   BMET  Recent Labs  09/26/12 0610 09/27/12 0500  NA 138 136  K 4.0 3.9  CL 100 96  CO2 30 30  GLUCOSE 154* 143*  BUN 30* 26*  CREATININE 0.79 0.72  CALCIUM 9.6 9.3    Studies/Results: Dg Chest Port 1 View  09/27/2012  *RADIOLOGY REPORT*  Clinical Data: Respiratory failure.  PORTABLE CHEST - 1 VIEW  Comparison: 09/25/2012  Findings: High density material in the left upper abdomen is suggestive for contrast within the bowel.  The patient has old left rib fractures.  Lungs are clear without airspace disease or edema. Heart and mediastinum are within normal limits.  Old right upper rib fractures.  IMPRESSION: No acute cardiopulmonary disease.  Old bilateral rib fractures.   Original Report Authenticated By: Richarda Overlie, M.D.    Dg Swallowing Func-speech Pathology  09/26/2012  Vivi Ferns McCoy, CCC-SLP     09/26/2012  1:47 PM Objective Swallowing Evaluation: Modified Barium Swallowing Study   Patient Details  Name: Gerald Leach MRN: [id number] Date of Birth: 1934-01-26  Today's Date: 09/26/2012 Time: 1100-1130 SLP Time Calculation (min): 30 min  Past Medical History:  Past Medical History  Diagnosis Date  . Hypertension   . Colonic polyp 2003  . GERD (gastroesophageal  reflux disease)   . ED (erectile dysfunction)   . Hyperlipidemia   . Hypothyroidism   . Brain aneurysm    Past Surgical History:  Past Surgical History  Procedure Laterality Date  . Brain aneurysm surgery      at [facility]  . Carotid artery aneurysm    . Colonoscopy      polyps  . Right hernia    . Craniotomy Right 09/19/2012    Procedure: CRANIOTOMY HEMATOMA EVACUATION SUBDURAL;  Surgeon:  Clydene Fake, MD;  Location: [facility];  Service:  Neurosurgery;  Laterality: Right;  . Craniotomy Right 09/22/2012    Procedure: CRANIOTOMY HEMATOMA EVACUATION SUBDURAL;  Surgeon:  Clydene Fake, MD;  Location: [facility];  Service:  Neurosurgery;  Laterality: Right;   HPI:  79 year old man, with past medical history of hypertension, and  hypothyroidism, admited with a large right subdural hematoma. Pt  intubated for procedure 4/4, extubated at 12 pm. Reintubatd 4/7  for craniotomy, extubated 4/9.      Assessment / Plan / Recommendation Clinical Impression  Dysphagia Diagnosis: Moderate oral phase dysphagia;Mild  pharyngeal phase dysphagia Clinical impression: Patient presents with a moderate oral and a  mild pharyngeal phase dysphagia. Oral weakness combined with AMS  results in decreased bolus cohesion and intermittent anterior  labial spillage of mild oral residuals and mild-moderate  vallecular residuals post swallow (also may be impacted by  presence of large  bore NG tube). Intermittent delayed swallow  initiation lead to one episode of trace penetration of thin  liquids which cleared with spontaneous dry swallows. Otherwise,  patient able to fully protect airway during today's study.  Recommend initiation of a dysphagia 2 (chopped) diet, thin  liquids, no straws to decrease risk of aspiration. SLP will f/u  closely for diet tolerance.     Treatment Recommendation  Therapy as outlined in treatment plan below    Diet Recommendation Dysphagia 2 (Fine chop);Thin liquid   Liquid Administration via: Cup;No straw Medication  Administration: Crushed with puree Supervision: Patient able to self feed;Full supervision/cueing  for compensatory strategies Compensations: Slow rate;Small sips/bites;Multiple dry swallows  after each bite/sip Postural Changes and/or Swallow Maneuvers: Seated upright 90  degrees    Other  Recommendations Oral Care Recommendations: Oral care BID   Follow Up Recommendations  Inpatient Rehab    Frequency and Duration min 3x week  2 weeks       SLP Swallow Goals Patient will utilize recommended strategies during swallow to  increase swallowing safety with: Minimal cueing Swallow Study Goal #2 - Progress: Progressing toward goal Goal #3: Pt will sustain attention to trials of thin liquids and  solids with timley oral transit and swallow response with min  verbal cues.  Swallow Study Goal #3 - Progress: Met   General HPI: 79 year old man, with past medical history of  hypertension, and hypothyroidism, admited with a large right  subdural hematoma. Pt intubated for procedure 4/4, extubated at  12 pm. Reintubatd 4/7 for craniotomy, extubated 4/9.  Type of Study: Modified Barium Swallowing Study Reason for Referral: Objectively evaluate swallowing function Previous Swallow Assessment: none Diet Prior to this Study: NPO Temperature Spikes Noted: No Respiratory Status: Room air History of Recent Intubation: Yes Length of Intubations (days): 1 days Date extubated: 09/19/12 Behavior/Cognition: Alert;Cooperative;Pleasant mood;Requires  cueing;Decreased sustained attention Oral Cavity - Dentition: Adequate natural dentition Oral Motor / Sensory Function: Impaired - see Bedside swallow  eval Self-Feeding Abilities: Able to feed self Patient Positioning: Upright in chair Baseline Vocal Quality: Clear Volitional Cough: Strong Volitional Swallow: Able to elicit Anatomy: Within functional limits Pharyngeal Secretions: Not observed secondary MBS    Reason for Referral Objectively evaluate swallowing function   Oral Phase Oral  Preparation/Oral Phase Oral Phase: Impaired Oral - Nectar Oral - Nectar Teaspoon: Lingual/palatal residue Oral - Nectar Cup: Lingual/palatal residue Oral - Thin Oral - Thin Cup: Lingual/palatal residue;Right anterior bolus  loss;Left anterior bolus loss Oral - Thin Straw: Lingual/palatal residue;Right anterior bolus  loss;Left anterior bolus loss (anterior loss post swallow) Oral - Solids Oral - Puree: Lingual/palatal residue Oral - Mechanical Soft: Lingual/palatal residue   Pharyngeal Phase Pharyngeal Phase Pharyngeal Phase: Impaired Pharyngeal - Nectar Pharyngeal - Nectar Teaspoon: Delayed swallow  initiation;Premature spillage to valleculae;Pharyngeal residue -  valleculae;Reduced tongue base retraction Pharyngeal - Nectar Cup: Delayed swallow initiation;Premature  spillage to valleculae;Pharyngeal residue - valleculae;Reduced  tongue base retraction Pharyngeal - Thin Pharyngeal - Thin Cup: Delayed swallow initiation;Premature  spillage to valleculae;Pharyngeal residue - valleculae;Reduced  tongue base retraction Pharyngeal - Thin Straw: Delayed swallow initiation;Pharyngeal  residue - valleculae;Reduced tongue base retraction;Premature  spillage to pyriform sinuses;Premature spillage to  valleculae;Penetration/Aspiration before swallow Penetration/Aspiration details (thin straw): Material enters  airway, remains ABOVE vocal cords and not ejected out (ejected  with subsequent swallow) Pharyngeal - Solids Pharyngeal - Puree: Delayed swallow initiation;Premature spillage  to valleculae;Pharyngeal residue - valleculae;Reduced tongue base  retraction Pharyngeal - Mechanical Soft: Delayed swallow  initiation;Premature spillage to valleculae;Pharyngeal residue -  valleculae;Reduced tongue base retraction Pharyngeal Phase - Comment Pharyngeal Comment: Delayed swallow initiation;Premature spillage  to valleculae;Pharyngeal residue - valleculae;Reduced tongue base  retraction  Cervical Esophageal Phase    GO  Ferdinand Lango  MA, CCC-SLP [phone]   Cervical Esophageal Phase Cervical Esophageal Phase: [facility]         McCoy Leah Meryl 09/26/2012, 1:47 PM      Assessment/Plan: Stable postop discussed situation with daughter relates no effusions going to rehabilitation soon.  LOS: 10 days  Continue supportive care we'll add small dose of alprazolam to help calm his nerves   Javeion Cannedy J 09/28/2012, 10:42 AM

## 2012-09-28 NOTE — Clinical Social Work Note (Signed)
CSW contacted patient's son, Lochlin Eppinger, [phone]. Agreed to [facility] search as backup if CIR unable to take patient upon discharge. CSW faxed out patient information to [facility]. Patient's wife has recently had back surgery and son or daughter, Meliton Samad, [phone] can assist with decision making. Weekday CSW to continue to follow and assist as needed.  Ricke Hey, [place] [phone] (weekend)

## 2012-09-28 NOTE — Clinical Social Work Placement (Signed)
Clinical Social Work Department CLINICAL SOCIAL WORK PLACEMENT NOTE 09/28/2012  Patient:  Gerald Leach, Gerald Leach  Account Number:  [id number] Admit date:  09/18/2012  Clinical Social Worker:  Peggyann Shoals  Date/time:  09/23/2012 07:21 PM  Clinical Social Work is seeking post-discharge placement for this patient at the following level of care:   SKILLED NURSING   (*CSW will update this form in Epic as items are completed)   09/23/2012  Patient/family provided with [facility] Department of Clinical Social Work's list of facilities offering this level of care within the geographic area requested by the patient (or if unable, by the patient's family).  09/23/2012  Patient/family informed of their freedom to choose among providers that offer the needed level of care, that participate in Medicare, Medicaid or managed care program needed by the patient, have an available bed and are willing to accept the patient.  09/23/2012  Patient/family informed of [facility]' ownership interest in [facility], as well as of the fact that they are under no obligation to receive care at this facility.  PASARR submitted to EDS on  PASARR number received from EDS on   FL2 transmitted to all facilities in geographic area requested by pt/family on  09/28/2012 FL2 transmitted to all facilities within larger geographic area on   Patient informed that his/her managed care company has contracts with or will negotiate with  certain facilities, including the following:     Patient/family informed of bed offers received:   Patient chooses bed at  Physician recommends and patient chooses bed at    Patient to be transferred to  on   Patient to be transferred to facility by   The following physician request were entered in Epic:   Additional Comments: Family prefers CIR, but agreeable to [facility] search as backup. Weekday CSW will continue to follow and assist as needed.  Ricke Hey,  [place] [phone] (weekend)

## 2012-09-29 LAB — GLUCOSE, CAPILLARY: Glucose-Capillary: 107 mg/dL — ABNORMAL HIGH (ref 70–99)

## 2012-09-29 NOTE — Progress Notes (Signed)
I met with patient and his wife at bedside. They prefer CIR admission rather than SNF. I will begin insurance approval for admission. Hopefully will have approval for admit tomorrow. [phone]

## 2012-09-29 NOTE — Progress Notes (Addendum)
Physical Therapy Treatment Patient Details Name: Gerald Leach MRN: [id number] DOB: 02-Jan-1934 Today's Date: 09/29/2012 Time: 8657-8469 PT Time Calculation (min): 24 min  PT Assessment / Plan / Recommendation Comments on Treatment Session  Pt making good progress however is limited by poor attention (sustained with moments of focused at times), poor balance, decreased safety awareness, activity tolerance, and increased need for assist with all mobility. He has continued difficulty with bil. LE clearance and stance control during gait and standing activities.   Pt continues to be very appropriate for CIR at this time.    Follow Up Recommendations  CIR     Does the patient have the potential to tolerate intense rehabilitation   Yes     Equipment Recommendations  Rolling walker with 5" wheels       Frequency Min 3X/week   Plan Frequency needs to be updated    Precautions / Restrictions Precautions Precautions: Fall       Mobility  Bed Mobility Bed Mobility: Rolling Right;Right Sidelying to Sit Rolling Right: 4: Min assist Right Sidelying to Sit: 4: Min assist Details for Bed Mobility Assistance: Assist for initiation and continuation of task (pt stops mid-way secondary to impaired attention). Assist through trunk and LEs Transfers Sit to Stand: 3: Mod assist;4: Min assist Stand to Sit: 4: Min assist Details for Transfer Assistance: Max cues (pt still not following) for back completely up to chair and to safely reach back with UEs to control descent. Pt reports "I saw it back there" Ambulation/Gait Ambulation/Gait Assistance: 3: Mod assist;4: Min assist Ambulation Distance (Feet): 100 Feet Assistive device: Rolling walker Ambulation/Gait Assistance Details: Mod assist progressing quickly to min assist. Pt advances feet by sliding them on the floor, able to clear feet with cues however only sustains correction for seconds secondary to impaired attention. Stairs: No Research officer, political party: No     PT Goals Acute Rehab PT Goals Pt will go Supine/Side to Sit: with rail;with modified independence PT Goal: Supine/Side to Sit - Progress: Progressing toward goal Pt will go Sit to Stand: with supervision;with upper extremity assist PT Goal: Sit to Stand - Progress: Progressing toward goal Pt will Stand: with supervision;with unilateral upper extremity support PT Goal: Stand - Progress: Progressing toward goal Pt will Ambulate: >150 feet;with rolling walker;with min assist PT Goal: Ambulate - Progress: Progressing toward goal  Visit Information  Last PT Received On: 09/29/12 Assistance Needed: +1    Subjective Data  Subjective: Why do you keep asking me the same questions?   Cognition  Cognition Overall Cognitive Status: Impaired Area of Impairment: Attention;Memory;Following commands;Safety/judgement;Awareness of deficits;Problem solving Arousal/Alertness: Awake/alert Orientation Level: Appears intact for tasks assessed Behavior During Session: Flat affect Current Attention Level: Focused;Sustained (focused on verge of sustained) Attention - Other Comments: Pt only able to carry out cues for ~3-5 sec then reverts to original gait or task. Pt has to stop gait to answer questions or talk. Following Commands: Follows multi-step commands inconsistently;Follows one step commands with increased time Safety/Judgement: Decreased awareness of safety precautions;Decreased safety judgement for tasks assessed;Impulsive;Decreased awareness of need for assistance Safety/Judgement - Other Comments: mod verbal cues for safety with walker however pt able to recall some of cues from previous therapy sessions Problem Solving: Requires min cues to find way back to room    Balance  Balance Balance Assessed: Yes Static Sitting Balance Static standing- Balance Support: Bilateral upper extremity supported Static standing- Level of Assistance: 4: Min assist  End  of Session  PT - End of Session Equipment Utilized During Treatment: Gait belt Activity Tolerance: Patient limited by fatigue Patient left: with call bell/phone within reach;in chair;with family/visitor present Nurse Communication: Mobility status (need for chair alarm if wife leaves)   GP     Wilhemina Bonito 09/29/2012, 10:21 AM

## 2012-09-29 NOTE — Progress Notes (Signed)
Doing well.   Temp:  [97.5 F (36.4 C)-98.2 F (36.8 C)] 97.9 F (36.6 C) (04/14 0614) Pulse Rate:  [57-81] 66 (04/14 0614) Resp:  [16-18] 17 (04/14 0614) BP: (112-146)/(64-82) 114/64 mmHg (04/14 0614) SpO2:  [100 %] 100 % (04/14 4098) Right mild hemiparesis Incision CDI  Plan: Pt improving  - waiting Rehab

## 2012-09-30 ENCOUNTER — Inpatient Hospital Stay (HOSPITAL_COMMUNITY)
Admission: RE | Admit: 2012-09-30 | Discharge: 2012-10-10 | DRG: 945 | Disposition: A | Discharge status: Home / Self Care | Payer: Medicare Other | Source: Intra-hospital | Attending: Physical Medicine & Rehabilitation | Admitting: Physical Medicine & Rehabilitation

## 2012-09-30 DIAGNOSIS — R279 Unspecified lack of coordination: Secondary | ICD-10-CM | POA: Diagnosis present

## 2012-09-30 DIAGNOSIS — S065X9A Traumatic subdural hemorrhage with loss of consciousness of unspecified duration, initial encounter: Secondary | ICD-10-CM

## 2012-09-30 DIAGNOSIS — E785 Hyperlipidemia, unspecified: Secondary | ICD-10-CM | POA: Diagnosis present

## 2012-09-30 DIAGNOSIS — I10 Essential (primary) hypertension: Secondary | ICD-10-CM | POA: Diagnosis present

## 2012-09-30 DIAGNOSIS — N529 Male erectile dysfunction, unspecified: Secondary | ICD-10-CM | POA: Diagnosis present

## 2012-09-30 DIAGNOSIS — E039 Hypothyroidism, unspecified: Secondary | ICD-10-CM | POA: Diagnosis present

## 2012-09-30 DIAGNOSIS — K219 Gastro-esophageal reflux disease without esophagitis: Secondary | ICD-10-CM | POA: Diagnosis present

## 2012-09-30 DIAGNOSIS — E119 Type 2 diabetes mellitus without complications: Secondary | ICD-10-CM | POA: Diagnosis present

## 2012-09-30 DIAGNOSIS — I6200 Nontraumatic subdural hemorrhage, unspecified: Secondary | ICD-10-CM | POA: Diagnosis present

## 2012-09-30 DIAGNOSIS — Z5189 Encounter for other specified aftercare: Principal | ICD-10-CM

## 2012-09-30 DIAGNOSIS — R1310 Dysphagia, unspecified: Secondary | ICD-10-CM | POA: Diagnosis present

## 2012-09-30 LAB — GLUCOSE, CAPILLARY
Glucose-Capillary: 148 mg/dL — ABNORMAL HIGH (ref 70–99)
Glucose-Capillary: 175 mg/dL — ABNORMAL HIGH (ref 70–99)
Glucose-Capillary: 205 mg/dL — ABNORMAL HIGH (ref 70–99)

## 2012-09-30 MED ORDER — ACETAMINOPHEN 325 MG PO TABS
325.0000 mg | ORAL_TABLET | ORAL | Status: DC | PRN
  Administered 2012-10-01 – 2012-10-03 (×6): 650 mg via ORAL
  Administered 2012-10-04: 325 mg via ORAL
  Administered 2012-10-04 – 2012-10-09 (×7): 650 mg via ORAL
  Filled 2012-09-30 (×5): qty 2
  Filled 2012-09-30: qty 1
  Filled 2012-09-30 (×8): qty 2

## 2012-09-30 MED ORDER — SORBITOL 70 % SOLN: 30.0000 mL | Freq: Every day | Status: DC | PRN

## 2012-09-30 MED ORDER — ALPRAZOLAM 0.25 MG PO TABS
0.2500 mg | ORAL_TABLET | Freq: Two times a day (BID) | ORAL | Status: DC
  Administered 2012-09-30 – 2012-10-01 (×3): 0.25 mg via ORAL
  Filled 2012-09-30 (×3): qty 1

## 2012-09-30 MED ORDER — PANTOPRAZOLE SODIUM 40 MG PO PACK
40.0000 mg | PACK | Freq: Every day | ORAL | Status: DC
  Administered 2012-10-01 – 2012-10-06 (×6): 40 mg via ORAL
  Filled 2012-09-30 (×9): qty 20

## 2012-09-30 MED ORDER — BIOTENE DRY MOUTH MT LIQD
15.0000 mL | Freq: Two times a day (BID) | OROMUCOSAL | Status: DC
  Administered 2012-09-30 – 2012-10-01 (×2): 15 mL via OROMUCOSAL

## 2012-09-30 MED ORDER — LEVETIRACETAM 500 MG PO TABS
500.0000 mg | ORAL_TABLET | Freq: Two times a day (BID) | ORAL | Status: DC
  Administered 2012-09-30 – 2012-10-10 (×20): 500 mg via ORAL
  Filled 2012-09-30 (×23): qty 1

## 2012-09-30 MED ORDER — INSULIN ASPART 100 UNIT/ML ~~LOC~~ SOLN
0.0000 [IU] | Freq: Three times a day (TID) | SUBCUTANEOUS | Status: DC
  Administered 2012-10-01: 3 [IU] via SUBCUTANEOUS
  Administered 2012-10-01 (×2): 2 [IU] via SUBCUTANEOUS
  Administered 2012-10-02: 3 [IU] via SUBCUTANEOUS
  Administered 2012-10-02 (×2): 2 [IU] via SUBCUTANEOUS
  Administered 2012-10-03: 11 [IU] via SUBCUTANEOUS
  Administered 2012-10-03 – 2012-10-04 (×4): 2 [IU] via SUBCUTANEOUS
  Administered 2012-10-04: 3 [IU] via SUBCUTANEOUS
  Administered 2012-10-05: 2 [IU] via SUBCUTANEOUS
  Administered 2012-10-06: 3 [IU] via SUBCUTANEOUS
  Administered 2012-10-07 (×3): 2 [IU] via SUBCUTANEOUS
  Administered 2012-10-08: 3 [IU] via SUBCUTANEOUS
  Administered 2012-10-08 (×2): 2 [IU] via SUBCUTANEOUS
  Administered 2012-10-09: 5 [IU] via SUBCUTANEOUS

## 2012-09-30 MED ORDER — LEVOTHYROXINE SODIUM 50 MCG PO TABS
50.0000 ug | ORAL_TABLET | Freq: Every day | ORAL | Status: DC
  Filled 2012-09-30: qty 1

## 2012-09-30 MED ORDER — PANTOPRAZOLE SODIUM 40 MG PO PACK: 40.0000 mg | PACK | Freq: Every day | ORAL | Status: DC

## 2012-09-30 MED ORDER — LEVOTHYROXINE SODIUM 50 MCG PO TABS
50.0000 ug | ORAL_TABLET | Freq: Every day | ORAL | Status: DC
  Administered 2012-10-01 – 2012-10-10 (×10): 50 ug via ORAL
  Filled 2012-09-30 (×14): qty 1

## 2012-09-30 MED ORDER — ONDANSETRON HCL 4 MG PO TABS: 4.0000 mg | ORAL_TABLET | Freq: Four times a day (QID) | ORAL | Status: DC | PRN

## 2012-09-30 MED ORDER — ATENOLOL 12.5 MG HALF TABLET
12.5000 mg | ORAL_TABLET | Freq: Every day | ORAL | Status: DC
  Administered 2012-10-01 – 2012-10-10 (×10): 12.5 mg via ORAL
  Filled 2012-09-30 (×11): qty 1

## 2012-09-30 MED ORDER — ATENOLOL 12.5 MG HALF TABLET: 12.5000 mg | ORAL_TABLET | Freq: Every day | ORAL | Status: DC

## 2012-09-30 MED ORDER — SIMVASTATIN 10 MG PO TABS
10.0000 mg | ORAL_TABLET | Freq: Every day | ORAL | Status: DC
  Administered 2012-09-30 – 2012-10-09 (×10): 10 mg via ORAL
  Filled 2012-09-30 (×11): qty 1

## 2012-09-30 MED ORDER — SIMVASTATIN 10 MG PO TABS
10.0000 mg | ORAL_TABLET | Freq: Every day | ORAL | Status: DC
  Filled 2012-09-30: qty 1

## 2012-09-30 MED ORDER — ONDANSETRON HCL 4 MG/2ML IJ SOLN: 4.0000 mg | Freq: Four times a day (QID) | INTRAMUSCULAR | Status: DC | PRN

## 2012-09-30 NOTE — Progress Notes (Signed)
Speech Language Pathology Treatment Patient Details Name: Gerald Leach MRN: [id number] DOB: 1933/07/20 Today's Date: 09/30/2012 Time: 1478-2956 SLP Time Calculation (min): 15 min  Assessment / Plan / Recommendation Clinical Impression  Pt continues to exhibit left inattention and decreased awareness of deficits and their functional implications.  Need for 24 hour supervision is anticipated at DC, with continued ST services for cognitive impairment.    SLP Plan  Continue with current plan of care    Pertinent Vitals/Pain No pain reported.  SLP Goals  SLP Goals Potential to Achieve Goals: Good Progress/Goals/Alternative treatment plan discussed with pt/caregiver and they: Agree SLP Goal #1 - Progress: Progressing toward goal SLP Goal #2 - Progress: Progressing toward goal SLP Goal #3 - Progress: Progressing toward goal  General Temperature Spikes Noted: No Respiratory Status: Room air Behavior/Cognition: Alert;Cooperative;Requires cueing Oral Cavity - Dentition: Adequate natural dentition Patient Positioning: Upright in bed  Oral Cavity - Oral Hygiene Does patient have any of the following "at risk" factors?: Other - dysphagia Brush patient's teeth BID with toothbrush (using toothpaste with fluoride): Yes Patient is AT RISK - Oral Care Protocol followed (see row info): Yes   Treatment Treatment focused on: Cognition Skilled Treatment: Goals addressed included maintaining eye contact, attention to the left, and awareness of deficits.  Pt was eating breakfast during this session.  Eye contact was minimal, due to pt focusing on breakfast tray. Pt did make eyecontact x2 when SLP standing on his right. No eyecontact made when SLP on pt left side.  Left oral leakage and left pocketing noted throughout breakfast. Verbal cues required for pt to attend to breakfast items left of midline.  Pt indicated cognitive skills to be at baseline, and was unable to identify deficits.  He was able to  recall having sustained a head injury, and having surgery.  Pt able to verbalize safety strategy of calling before getting out of bed. Min cues required for pt to recall rationale for this process, but did  eventually verbalize that the purpose was to keep him from falling. RN informed of left anterior leakage and left pocketing. RN  planning to complete thorough oral care once pt completed breakfast.  Recommend good oral care before and after meals, due to aspiration risk and left inattention.  Tatjana Turcott B. Murvin Natal [facility], CCC-SLP [phone]  Leigh Aurora 09/30/2012, 9:58 AM

## 2012-09-30 NOTE — Clinical Social Work Note (Signed)
Clinical Social Work   Pt is ready for discharge to CIR. Pt's family is agreeable to discharge plan. Pt will transfer to CIR once insurance approval is obtained. CSW is signing off as no further needs identified. Please reconsult if a need arises prior to discharge.   Dede Query, MSW, LCSW [phone]

## 2012-09-30 NOTE — Discharge Summary (Signed)
Physician Discharge Summary  [name] MRN: [id number] DOB/AGE: Dec 01, 1933 80 y.o.  Admit date: 09/18/2012 Discharge date: 09/30/2012  Admission Diagnoses:SDH  Discharge Diagnoses: SDH Principal Problem:   Subdural hematoma Active Problems:   HYPOTHYROIDISM   HYPERTENSION   Diabetes mellitus type II, uncontrolled   Respiratory failure, post-operative   S/P craniotomy   Dysphagia   Discharged Condition: fair  Hospital Course: pt admitted for decrease MS and underwent crany for SDH -   Post op  - pt watched in ICU and became more lethargic  - CT showed recurrent  SDH and pt had repeat craniotomy for SDH  , He has slowly improved  Since  - worked with therapies, transferred to floor  -  And rehab  Was consulted  Consults: rehabilitation medicine, CCM  Significant Diagnostic Studies: radiology: CT scan: head  Treatments: surgery: craniotomy for SDH (twice)  Discharge Exam: Blood pressure 143/82, pulse 69, temperature 97.8 F (36.6 C), temperature source Oral, resp. rate 18, height 6\' 1"  (1.854 m), weight 75.2 kg (165 lb 12.6 oz), SpO2 97.00%. Wound:C/D/I   - neuro  -  AAOx3, FC all 4 , ambulating with assist  - mild Left hemiparesis, still some higher cognitive defecits  Disposition: rehab   Future Appointments Provider Department Dept Phone   12/03/2012 9:00 AM Roderick Pee, MD [facility] [phone]       Medication List    STOP taking these medications       aspirin EC 81 MG tablet     HYDROcodone-acetaminophen 7.5-325 MG per tablet  Commonly known as:  NORCO      TAKE these medications       atenolol 12.5 mg Tabs  Commonly known as:  TENORMIN  Take 0.5 tablets (12.5 mg total) by mouth daily.     glucose blood test strip  Commonly known as:  FREESTYLE LITE  1 each by Other route daily. Use as instructed     levothyroxine 50 MCG tablet  Commonly known as:  SYNTHROID, LEVOTHROID  Take 50 mcg by mouth daily before  breakfast.     metFORMIN 500 MG tablet  Commonly known as:  GLUCOPHAGE  Take 500 mg by mouth 2 (two) times daily with a meal.     omeprazole 20 MG capsule  Commonly known as:  PRILOSEC  Take 20 mg by mouth every Monday, Wednesday, and Friday.     ONE TOUCH LANCETS Misc  1 each by Does not apply route daily.     simvastatin 10 MG tablet  Commonly known as:  ZOCOR  Take 10 mg by mouth at bedtime.     traMADol 50 MG tablet  Commonly known as:  ULTRAM  Take 50 mg by mouth every 8 (eight) hours as needed for pain.       need staples removed 11-13 days post-op  Signed: Clydene Fake, MD 09/30/2012, 8:15 AM

## 2012-09-30 NOTE — Progress Notes (Signed)
Speech Language Pathology Dysphagia Treatment Patient Details Name: Gerald Leach MRN: [id number] DOB: 03/15/1934 Today's Date: 09/30/2012 Time: 6213-0865 SLP Time Calculation (min): 34 min  Assessment / Plan / Recommendation Clinical Impression  Pt seen during breakfast of puree (Dys 1) and thin liquids. Intermittent wet voice quality with throat clear noted during meal. Pt also noted to exhibit left anterior leakage and left inattention to tray.  Cueing needed to attend to left, and to check left pocketing.    Diet Recommendation  Continue with Current Diet: Dysphagia 1 (puree);Thin liquid    SLP Plan Continue with current plan of care   Pertinent Vitals/Pain No pain reported   Swallowing Goals  SLP Swallowing Goals Patient will utilize recommended strategies during swallow to increase swallowing safety with: Minimal cueing Swallow Study Goal #2 - Progress: Progressing toward goal  General Temperature Spikes Noted: No Respiratory Status: Room air Behavior/Cognition: Alert;Cooperative;Requires cueing Oral Cavity - Dentition: Adequate natural dentition Patient Positioning: Upright in bed  Oral Cavity - Oral Hygiene Does patient have any of the following "at risk" factors?: Other - dysphagia Brush patient's teeth BID with toothbrush (using toothpaste with fluoride): Yes Patient is AT RISK - Oral Care Protocol followed (see row info): Yes   Dysphagia Treatment Treatment focused on: Skilled observation of diet tolerance;Patient/family/caregiver education;Facilitation of oral preparatory phase Treatment Methods/Modalities: Skilled observation;Differential diagnosis Patient observed directly with PO's: Yes Type of PO's observed: Dysphagia 1 (puree);Thin liquids Feeding: Able to feed self;Needs set up Liquids provided via: Cup Oral Phase Signs & Symptoms: Left pocketing;Anterior loss/spillage Pharyngeal Phase Signs & Symptoms: Wet vocal quality (intermittent throat clear) Type of  cueing: Verbal;Tactile;Visual Amount of cueing: Moderate   Gerald Bacot B. Murvin Natal [facility], CCC-SLP [phone]  Leigh Aurora 09/30/2012, 9:44 AM

## 2012-09-30 NOTE — Progress Notes (Signed)
Pt arrived to room 4002 from 4 no. via bed; oriented to place, situation; oriented to room and rehab process, will need reinforcement. VSS, denies pain/discomfort.  Penis and scrotum red,mild edema; foreskin retracted;gently adjusted. Rash to back, groin. MGP applied. See FS for full assessment.

## 2012-09-30 NOTE — H&P (Signed)
IO:[id number]  :  XLK:[id number] C Bright is a 79 y.o. right-handed male with history of brain aneurysm that required surgery at [facility] 2002. Admitted 09/19/2012 with altered mental status and vomiting x24 hours. CT scan imaging revealed subdural hematoma. Underwent right craniotomy hematoma evacuation 09/19/2012 per Dr. Phoebe Perch. Patient with increasing lethargy 09/22/2012 with followup cranial CT scan showing recurrent bleeding in the right subdural space and returned to the OR for redo right craniotomy for recurrent subdural hematoma 09/22/2012. Critical care medicine consulted postoperatively for acute respiratory issues patient was extubated 09/24/2012. Maintained on Keppra for seizure prophylaxis. Patient remained n.p.o. with nasogastric tube feeds for nutritional support and diet has now been advanced to a dysphagia 1 thin liquid. Physical therapy evaluation completed an ongoing noted issues in regards to being impulsive, restless and agitated. Recommendations are made for physical medicine rehabilitation consult to consider inpatient rehabilitation services. Patient was felt to be a candidate for inpatient rehabilitation services and was admitted for a comprehensive rehabilitation program   Asking "what do you do at Rehab?" Review of Systems  Reflux and history of falls  All other systems reviewed and are negative  Past Medical History   Diagnosis  Date   .  Hypertension    .  Colonic polyp  2003   .  GERD (gastroesophageal reflux disease)    .  ED (erectile dysfunction)    .  Hyperlipidemia    .  Hypothyroidism    .  Brain aneurysm     Past Surgical History   Procedure  Laterality  Date   .  Brain aneurysm surgery       at [facility]   .  Carotid artery aneurysm     .  Colonoscopy       polyps   .  Right hernia     .  Craniotomy  Right  09/19/2012     Procedure: CRANIOTOMY HEMATOMA EVACUATION SUBDURAL; Surgeon: Clydene Fake, MD; Location: [facility]; Service: Neurosurgery;  Laterality: Right;   .  Craniotomy  Right  09/22/2012     Procedure: CRANIOTOMY HEMATOMA EVACUATION SUBDURAL; Surgeon: Clydene Fake, MD; Location: [facility]; Service: Neurosurgery; Laterality: Right;    History reviewed. No pertinent family history.  Social History: reports that he has never smoked. He has never used smokeless tobacco. He reports that drinks alcohol. He reports that he does not use illicit drugs.  Allergies: No Known Allergies  Medications Prior to Admission   Medication  Sig  Dispense  Refill   .  aspirin EC 81 MG tablet  Take 81 mg by mouth at bedtime.     Marland Kitchen  atenolol (TENORMIN) 12.5 mg TABS  Take 0.5 tablets (12.5 mg total) by mouth daily.  100 tablet  3   .  HYDROcodone-acetaminophen (NORCO) 7.5-325 MG per tablet  Take 1 tablet by mouth every 6 (six) hours as needed for pain.     Marland Kitchen  levothyroxine (SYNTHROID, LEVOTHROID) 50 MCG tablet  Take 50 mcg by mouth daily before breakfast.     .  metFORMIN (GLUCOPHAGE) 500 MG tablet  Take 500 mg by mouth 2 (two) times daily with a meal.     .  omeprazole (PRILOSEC) 20 MG capsule  Take 20 mg by mouth every Monday, Wednesday, and Friday.     .  simvastatin (ZOCOR) 10 MG tablet  Take 10 mg by mouth at bedtime.     .  traMADol (ULTRAM) 50 MG tablet  Take 50 mg by mouth every 8 (eight) hours as needed for pain.     Marland Kitchen  glucose blood (FREESTYLE LITE) test strip  1 each by Other route daily. Use as instructed  100 each  3   .  ONE TOUCH LANCETS MISC  1 each by Does not apply route daily.  200 each  0    Home:  Home Living  Lives With: Spouse  Available Help at Discharge: Family;Available 24 hours/day;Other (Comment) (wife had recent back surgery)  Type of Home: House  Home Access: Level entry  Home Layout: Multi-level  Alternate Level Stairs-Number of Steps: 7  Alternate Level Stairs-Rails: None  Bathroom Shower/Tub: Medical sales representative: Standard  Home Adaptive Equipment: Straight cane  Additional Comments: Pt fell 6  months ago and has used cane since that time but son states no real balance deficits prior to this admit  Functional History:  Prior Function  Able to Take Stairs?: Yes  Driving: Yes  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, 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)  Functional Status:  Mobility:  Bed Mobility  Bed Mobility: Rolling Right;Right Sidelying to Sit  Rolling Right: 4: Min assist  Rolling Left: 1: +2 Total assist;With rail  Rolling Left: Patient Percentage: 40%  Right Sidelying to Sit: 4: Min assist  Left Sidelying to Sit: 1: +2 Total assist;With rails;HOB flat  Left Sidelying to Sit: Patient Percentage: 20%  Supine to Sit: 4: Min assist;HOB elevated  Sitting - Scoot to [place] of Bed: 5: Supervision  Sit to Supine: 5: Supervision;HOB flat  Transfers  Transfers: Sit to Stand;Stand to Sit  Sit to Stand: 3: Mod assist;4: Min assist  Sit to Stand: Patient Percentage: 40%  Stand to Sit: 4: Min assist  Ambulation/Gait  Ambulation/Gait Assistance: 3: Mod assist;4: Min Designer, television/film set (Feet): 100 Feet  Assistive device: Rolling walker  Ambulation/Gait Assistance Details: Mod assist progressing quickly to min assist. Pt advances feet by sliding them on the floor, able to clear feet with cues however only sustains correction for seconds secondary to impaired attention.  Gait Pattern: Step-through pattern;Decreased stride length;Decreased weight shift to right;Left flexed knee in stance;Right flexed knee in stance;Lateral trunk lean to left;Wide base of support;Trunk flexed;Shuffle  Stairs: No  Wheelchair Mobility  Wheelchair Mobility: No  ADL:  ADL  Grooming: Wash/dry hands;Moderate assistance  Where Assessed - Grooming: Supported standing  Lower Body Dressing: Moderate assistance  Where Assessed - Lower Body Dressing: Supported standing  Toilet Transfer: Moderate assistance  Toilet Transfer Method: Sit to stand  Toilet Transfer Equipment: Raised toilet  seat with arms (or 3-in-1 over toilet)  Equipment Used: Gait belt;Rolling walker  Transfers/Ambulation Related to ADLs: Pt ambulating with RW pushed too far in advance. Pt needed constant v/c. Pt unware of lines and leads  ADL Comments: Pt requesting to void bowels on arrival. Pt states he does not need Occupational therapy any more. Pt assumed the definition of an OT was related to returning to work. Pt and family educated on the purpose of OT. Pt while ambulating demonstrates Lt knee flexion and progressed to bil knee flexion due to fatigue.  Cognition:  Cognition  Overall Cognitive Status: Impaired  Arousal/Alertness: Awake/alert  Orientation Level: Oriented X4 (forgetful)  Attention: Focused;Sustained;Selective  Focused Attention: Appears intact  Sustained Attention: Appears intact  Selective Attention: Impaired  Selective Attention Impairment: Verbal complex;Functional complex  Memory: Impaired  Memory Impairment: Prospective memory (100% accuracy with words,  doesnt recall safety precautions)  Awareness: Impaired  Awareness Impairment: Intellectual impairment;Emergent impairment;Anticipatory impairment  Problem Solving: Appears intact  Executive Function: Reasoning;Sequencing;Organizing;Decision Making;Self Monitoring;Self Correcting  Reasoning: Appears intact  Sequencing: Appears intact  Organizing: Appears intact  Decision Making: Impaired  Decision Making Impairment: Verbal complex;Functional basic  Self Monitoring: Impaired  Self Monitoring Impairment: Verbal complex;Functional basic  Self Correcting: Impaired  Self Correcting Impairment: Verbal complex;Functional basic  Behaviors: Impulsive  Safety/Judgment: Impaired  Cognition  Overall Cognitive Status: Impaired  Area of Impairment: Attention;Memory;Following commands;Safety/judgement;Awareness of deficits;Problem solving  Arousal/Alertness: Awake/alert  Orientation Level: Appears intact for tasks assessed  Behavior  During Session: Flat affect  Current Attention Level: Focused;Sustained (focused on verge of sustained)  Attention - Other Comments: Pt only able to carry out cues for ~3-5 sec then reverts to original gait or task. Pt has to stop gait to answer questions or talk.  Following Commands: Follows multi-step commands inconsistently;Follows one step commands with increased time  Safety/Judgement: Decreased awareness of safety precautions;Decreased safety judgement for tasks assessed;Impulsive;Decreased awareness of need for assistance  Safety/Judgement - Other Comments: mod verbal cues for safety with walker however pt able to recall some of cues from previous therapy sessions  Problem Solving: Requires min cues to find way back to room  Physical Exam:  Blood pressure 143/82, pulse 69, temperature 97.8 F (36.6 C), temperature source Oral, resp. rate 18, height 6\' 1"  (1.854 m), weight 75.2 kg (165 lb 12.6 oz), SpO2 97.00%.  Physical Exam  Vitals reviewed.  Constitutional:  79 year old white male  HENT:  Oral hygiene is poor with dried secretions around the palate  Eyes:  Pupils are reactive to light.  Neck: Neck supple. No thyromegaly present.  Cardiovascular: Normal rate and regular rhythm.  Pulmonary/Chest:  Decreased breath sounds at the bases but clear to auscultation.  Abdominal: Bowel sounds are normal. He exhibits no distension.  Neurological:  Patient was alert. He was oriented to person and hospital but needed cues for date. Limited awareness of his deficits as well as poor attention.  Skin:  Craniotomy site healing.  4/5 strength in bilateral deltoid, biceps, triceps, grip, hip flexor, knee extensors, ankle dorsiflexor plantar flexor  Sensation unable to assess secondary to decreased attention  Oriented to person and hospital.Not oriented to time or situation. After cueing he does realize he's had brain surgery  Results for orders placed during the hospital encounter of 09/18/12  (from the past 48 hour(s))   GLUCOSE, CAPILLARY Status: Abnormal    Collection Time    09/28/12 11:01 AM   Result  Value  Range    Glucose-Capillary  223 (*)  70 - 99 mg/dL   GLUCOSE, CAPILLARY Status: Abnormal    Collection Time    09/28/12 4:34 PM   Result  Value  Range    Glucose-Capillary  116 (*)  70 - 99 mg/dL   GLUCOSE, CAPILLARY Status: Abnormal    Collection Time    09/28/12 9:46 PM   Result  Value  Range    Glucose-Capillary  170 (*)  70 - 99 mg/dL   TRIGLYCERIDES Status: None    Collection Time    09/29/12 5:46 AM   Result  Value  Range    Triglycerides  128  <150 mg/dL   GLUCOSE, CAPILLARY Status: Abnormal    Collection Time    09/29/12 6:42 AM   Result  Value  Range    Glucose-Capillary  123 (*)  70 - 99 mg/dL   GLUCOSE, CAPILLARY  Status: Abnormal    Collection Time    09/29/12 11:24 AM   Result  Value  Range    Glucose-Capillary  223 (*)  70 - 99 mg/dL   GLUCOSE, CAPILLARY Status: Abnormal    Collection Time    09/29/12 4:13 PM   Result  Value  Range    Glucose-Capillary  107 (*)  70 - 99 mg/dL    Comment 1  Notify RN    GLUCOSE, CAPILLARY Status: Abnormal    Collection Time    09/29/12 9:03 PM   Result  Value  Range    Glucose-Capillary  167 (*)  70 - 99 mg/dL    Comment 1  Notify RN     Comment 2  Documented in Chart    GLUCOSE, CAPILLARY Status: Abnormal    Collection Time    09/30/12 6:29 AM   Result  Value  Range    Glucose-Capillary  148 (*)  70 - 99 mg/dL    Comment 1  Documented in Chart     Comment 2  Notify RN     No results found.  Post Admission Physician Evaluation:  1. Functional deficits secondary to R SDH s/p craniotomy x 2 with cognitive and balance deficits. 2. Patient is admitted to receive collaborative, interdisciplinary care between the physiatrist, rehab nursing staff, and therapy team. 3. Patient's level of medical complexity and substantial therapy needs in context of that medical necessity cannot be provided at a lesser  intensity of care such as a SNF. 4. Patient has experienced substantial functional loss from his/her baseline which was documented above under the "Functional History" and "Functional Status" headings. Judging by the patient's diagnosis, physical exam, and functional history, the patient has potential for functional progress which will result in measurable gains while on inpatient rehab. These gains will be of substantial and practical use upon discharge in facilitating mobility and self-care at the household level. 5. Physiatrist will provide 24 hour management of medical needs as well as oversight of the therapy plan/treatment and provide guidance as appropriate regarding the interaction of the two. 6. 24 hour rehab nursing will assist with bladder management, bowel management, safety, skin/wound care, disease management, medication administration, pain management and patient education and help integrate therapy concepts, techniques,education, etc. 7. PT will assess and treat for/with: pre gait,gait, NM re ed, safety, endurance, balance. Goals are: Sup Mobility. 8. OT will assess and treat for/with: ADL,Cog/percept,NM re ed, balance,safety , endurance. Goals are: Sup ADL. 9. SLP will assess and treat for/with: cognition, med management. Goals are: 100% orientation, able to give basic personal medical information. 10. Case Management and Social Worker will assess and treat for psychological issues and discharge planning. 11. Team conference will be held weekly to assess progress toward goals and to determine barriers to discharge. 12. Patient will receive at least 3 hours of therapy per day at least 5 days per week. 13. ELOS: 2wk Prognosis: good Medical Problem List and Plan:  1. Right subdural hematoma. Status post craniotomy evacuation of hematoma 09/19/2012 and redo craniotomy 09/22/2012 for recurrent subdural hematoma  2. DVT Prophylaxis/Anticoagulation: SCDs. Monitor for any signs of DVT  3.  Mood: Xanax 0.25 mg twice a day. Check sleep chart  4. Dysphagia. Dysphagia 1 thin liquid diet. Monitor for signs of aspiration. Followup speech therapy  5. Neuropsych: This patient is not capable of making decisions on his/her own behalf.  6. Seizure prophylaxis. Keppra 500 mg twice a day  7. Hypertension. Tenormin  12.5 mg daily. Monitor with increased activity  8. Hypothyroidism. Synthroid  9. Non-insulin-dependent diabetes mellitus. Latest documented hemoglobin A1c of 6.3. Check blood sugars a.c. and at bedtime. Patient on Glucophage 500 mg twice a day prior to admission  10. GERD. Protonix  11. Hyperlipidemia. Zocor  09/30/2012

## 2012-09-30 NOTE — Progress Notes (Signed)
I have insurance approval to admit pt to IP rehab today. Wife is aware and I will arrange. [phone]

## 2012-09-30 NOTE — PMR Pre-admission (Signed)
PMR Admission Coordinator Pre-Admission Assessment  Patient: Gerald Leach is an 79 y.o., male MRN: [id number] DOB: [birth year] Height: 6\' 1"  (185.4 cm) Weight: 75.2 kg (165 lb 12.6 oz)              Insurance Information HMO: yes    PPO:      PCP:      IPA:      80/20:      OTHER: medicare replacement  PRIMARY: [employer]      Policy#: [id number]      Subscriber: pt CM Name: Oretha Milch      Phone#: [phone]     Fax#: [phone] Pre-Cert#: [id number]      Employer: retired Benefits:  Phone #: [phone]     Name: 4/14 Eff. Date: 06/19/11 active     Deduct: none      Out of Pocket Max: $4900      Life Max: none CIR: $295 per day days 1 thru 5      SNF: $25 per day days 1 thru 20, $152 per day days 21 thru 49, no copay days 50 through 100. 100 days per benefit period Outpatient: $45 copay     Co-Pay: no visit limit Home Health: 100%      Co-Pay: none DME: 80%     Co-Pay: none Providers: in network  SECONDARY: none     Emergency Contact Information Contact Information   Name Relation Home Work Mobile   Hoover Spouse [phone]  [phone]   Acelin, Ferdig   [phone]   Stencil,Leighanee Daughter   [phone]     Current Medical History  Patient Admitting Diagnosis: Right SDH,   History of Present Illness: Gerald Leach is a 79 y.o. right-handed male with history of brain aneurysm that required surgery at [facility] 2002. Admitted 09/19/2012 with altered mental status and vomiting x24 hours. CT scan imaging revealed subdural hematoma. Underwent right craniotomy hematoma evacuation 09/19/2012 per Dr. Phoebe Perch. Patient with increasing lethargy 09/22/2012 with followup cranial CT scan showing recurrent bleeding in the right subdural space and returned to the OR for redo right craniotomy for recurrent subdural hematoma 09/22/2012. Critical care medicine consulted postoperatively for acute respiratory issues patient was extubated 09/24/2012. Maintained on Keppra  for seizure prophylaxis. Patient remained n.p.o. with nasogastric tube feeds for nutritional support. Now pt on D1 diet with thin liquids. Physical therapy evaluation completed an ongoing noted issues in regards to being impulsive, restless and agitated. Recommendations are made for physical medicine rehabilitation consult to consider inpatient rehabilitation services   Past Medical History  Past Medical History  Diagnosis Date  . Hypertension   . Colonic polyp 2003  . GERD (gastroesophageal reflux disease)   . ED (erectile dysfunction)   . Hyperlipidemia   . Hypothyroidism   . Brain aneurysm     Family History  family history is not on file.  Prior Rehab/Hospitalizations: none   Current Medications  Current facility-administered medications:acetaminophen (TYLENOL) suppository 650 mg, 650 mg, Rectal, Q4H PRN, Clydene Fake, MD;  acetaminophen (TYLENOL) tablet 650 mg, 650 mg, Oral, Q4H PRN, Clydene Fake, MD, 650 mg at 09/28/12 1029;  ALPRAZolam Prudy Feeler) tablet 0.25 mg, 0.25 mg, Oral, BID, Barnett Abu, MD, 0.25 mg at 09/30/12 1114 antiseptic oral rinse (BIOTENE) solution 15 mL, 15 mL, Mouth Rinse, BID, Clydene Fake, MD, 15 mL at 09/30/12 0759;  [START ON 10/01/2012] atenolol (TENORMIN) tablet 12.5 mg, 12.5 mg, Oral, Daily, Clydene Fake, MD;  hydrALAZINE (APRESOLINE)  injection 20 mg, 20 mg, Intravenous, Q6H PRN, Atilano Ina, MD;  insulin aspart (novoLOG) injection 0-15 Units, 0-15 Units, Subcutaneous, TID WC, Clydene Fake, MD, 2 Units at 09/30/12 0757 labetalol (NORMODYNE,TRANDATE) injection 10-40 mg, 10-40 mg, Intravenous, Q10 min PRN, Clydene Fake, MD, 10 mg at 09/22/12 1230;  levETIRAcetam (KEPPRA) tablet 500 mg, 500 mg, Oral, BID, Clydene Fake, MD, 500 mg at 09/30/12 1113;  [START ON 10/01/2012] levothyroxine (SYNTHROID, LEVOTHROID) tablet 50 mcg, 50 mcg, Oral, QAC breakfast, Clydene Fake, MD morphine 2 MG/ML injection 1-2 mg, 1-2 mg, Intravenous, Q2H PRN, Clydene Fake, MD, 2  mg at 09/26/12 0120;  ondansetron [facility]) injection 4 mg, 4 mg, Intravenous, Q4H PRN, Clydene Fake, MD;  ondansetron [facility]) tablet 4 mg, 4 mg, Oral, Q4H PRN, Clydene Fake, MD;  Melene Muller ON 10/01/2012] pantoprazole sodium (PROTONIX) 40 mg/20 mL oral suspension 40 mg, 40 mg, Oral, Daily, Clydene Fake, MD promethazine (PHENERGAN) tablet 12.5-25 mg, 12.5-25 mg, Oral, Q4H PRN, Clydene Fake, MD;  simvastatin (ZOCOR) tablet 10 mg, 10 mg, Oral, QHS, Clydene Fake, MD  Patients Current Diet: Dysphagia 1 diet with thin liquids  Precautions / Restrictions Precautions Precautions: Fall Restrictions Weight Bearing Restrictions: No   Prior Activity Level [place]    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 Pt has to stop gait to answer questions or talk. Orientation Level: Oriented X4 (forgetful) Following Commands: Follows multi-step commands inconsistently;Follows one step commands with increased time Safety/Judgement: Decreased awareness of safety precautions;Decreased safety judgement for tasks assessed;Impulsive;Decreased awareness of need for assistance Safety/Judgement - Other Comments: mod verbal cues for safety with walker however pt able to recall some of cues from previous therapy  sessions Attention: Focused;Sustained;Selective Focused Attention: Appears intact Sustained Attention: Appears intact Selective Attention: Impaired Selective Attention Impairment: Verbal complex;Functional complex Memory: Impaired Memory Impairment: Prospective memory (100% accuracy with words, doesnt recall safety precautions) Awareness: Impaired Awareness Impairment: Intellectual impairment;Emergent impairment;Anticipatory impairment Problem Solving: Appears intact Executive Function: Reasoning;Sequencing;Organizing;Decision Making;Self Monitoring;Self Correcting Reasoning: Appears intact Sequencing: Appears intact Organizing: Appears intact Decision Making: Impaired Decision Making Impairment: Verbal complex;Functional basic Self Monitoring: Impaired Self Monitoring Impairment: Verbal complex;Functional basic Self Correcting: Impaired Self Correcting Impairment: Verbal complex;Functional basic Behaviors: Impulsive Safety/Judgment: Impaired    Extremity Assessment (includes Sensation/Coordination)  RUE ROM/Strength/Tone: Within functional levels RUE Sensation: WFL - Light Touch RUE Coordination: WFL - gross/fine motor  RLE ROM/Strength/Tone: WFL for tasks assessed    ADLs  Grooming: Wash/dry hands;Moderate assistance Where Assessed - Grooming: Supported standing Lower Body Dressing: Moderate assistance Where Assessed - Lower Body Dressing: Supported standing Toilet Transfer: Moderate assistance Toilet Transfer Method: Sit to stand Toilet Transfer Equipment: Raised toilet seat with arms (or 3-in-1 over toilet) Toileting - Clothing Manipulation and Hygiene: Moderate assistance Where Assessed - Toileting Clothing Manipulation and Hygiene: Sit to stand from 3-in-1 or toilet Equipment Used: Gait belt;Rolling walker Transfers/Ambulation  Related to ADLs: Pt ambulating with RW pushed too far in advance. Pt needed constant v/c. Pt unware of lines and leads ADL Comments: Pt  requesting to void bowels on arrival. Pt states he does not need Occupational therapy any more. Pt assumed the definition of an OT was related to returning to work. Pt and family educated on the purpose of OT. Pt while ambulating demonstrates Lt knee flexion and progressed to bil knee flexion due to fatigue.     Mobility  Bed Mobility: Rolling Right;Right Sidelying to Sit Rolling Right: 4: Min assist Rolling Left: 1: +2 Total assist;With rail Rolling Left: Patient Percentage: 40% Right Sidelying to Sit: 4: Min assist Left Sidelying to Sit: 1: +2 Total assist;With rails;HOB flat Left Sidelying to Sit: Patient Percentage: 20% Supine to Sit: 4: Min assist;HOB elevated Sitting - Scoot to Edge of Bed: 5: Supervision Sit to Supine: 5: Supervision;HOB flat    Transfers  Transfers: Sit to Stand;Stand to Sit Sit to Stand: 3: Mod assist;4: Min assist Sit to Stand: Patient Percentage: 40% Stand to Sit: 4: Min assist    Ambulation / Gait / Stairs / Psychologist, prison and probation services  Ambulation/Gait Ambulation/Gait Assistance: 3: Mod assist;4: Min Environmental consultant (Feet): 100 Feet Assistive device: Rolling walker Ambulation/Gait Assistance Details: Mod assist progressing quickly to min assist. Pt advances feet by sliding them on the floor, able to clear feet with cues however only sustains correction for seconds secondary to impaired attention. Gait Pattern: Step-through pattern;Decreased stride length;Decreased weight shift to right;Left flexed knee in stance;Right flexed knee in stance;Lateral trunk lean to left;Wide base of support;Trunk flexed;Shuffle Stairs: No Corporate treasurer: No    Posture / Games developer Sitting - Balance Support: Bilateral upper extremity supported Static Sitting - Level of Assistance: 4: Min assist Static Sitting - Comment/# of Minutes: kept leaning back initially, then improved over time; partially due to cervical flexion  with thoracic kyphosis and pt leans posterior to make visualize environment Dynamic Standing Balance Dynamic Standing - Balance Support: During functional activity;No upper extremity supported Dynamic Standing - Level of Assistance: 3: Mod assist Dynamic Standing - Comments: washing hands at sink with OT    Special needs/care consideration Bowel mgmt:continent Bladder mgmt:continent    Previous Home Environment Living Arrangements: Spouse/significant other Lives With: Spouse Available Help at Discharge: Family;Available 24 hours/day;Other (Comment) (wife had recent back surgery) Type of Home: House Home Layout: Multi-level Alternate Level Stairs-Rails: None Alternate Level Stairs-Number of Steps: 7 Home Access: Level entry Bathroom Shower/Tub: Engineer, manufacturing systems: Standard Home Care Services: No Additional Comments: Pt fell 6 months ago and has used cane since that time but son states no real balance deficits prior to this admit  Discharge Living Setting Plans for Discharge Living Setting: Patient's home;Lives with (comment) (wife) Type of Home at Discharge: House Discharge Home Layout: Multi-level Alternate Level Stairs-Number of Steps: 7 steps Discharge Home Access: Level entry Discharge Bathroom Shower/Tub: Tub/shower unit Discharge Bathroom Toilet: Standard Discharge Bathroom Accessibility: Yes How Accessible: Accessible via walker Do you have any problems obtaining your medications?: No  Social/Family/Support Systems Patient Roles: Spouse;Parent;Other (Comment) (employee) Contact Information: Donaciano Eva, spouse Anticipated Caregiver: wife , son , and two daughters Anticipated Caregiver's Contact Information: see above Ability/Limitations of Caregiver: wife uses cane. transport chair for long distances. recent back surgery Caregiver Availability: 24/7 Discharge Plan Discussed with Primary Caregiver: Yes Is Caregiver In Agreement with Plan?: Yes Does  Caregiver/Family have Issues with Lodging/Transportation while Pt is  in Rehab?: No There is a "friend", Claris Che, who has visited. States she is his partner of 14 years. She was confronted by family in ICU and escorted out by security per family request.  Goals/Additional Needs Patient/Family Goal for Rehab: supervision with PT, OT, and SLP Expected length of stay: ELOS 2 weeks Dietary Needs: Dysphagia 1 with thin liquids Pt/Family Agrees to Admission and willing to participate: Yes Program Orientation Provided & Reviewed with Pt/Caregiver Including Roles  & Responsibilities: Yes   Decrease burden of Care through IP rehab admission: n/a  Possible need for SNF placement upon discharge:not anticipated. Wife wants to avoid if at all possible   Patient Condition: This patient's medical and functional status has changed since the consult dated: 09/26/12 in which the Rehabilitation Physician determined and documented that the patient's condition is appropriate for intensive rehabilitative care in an inpatient rehabilitation facility. See "History of Present Illness" (above) for medical update. Functional changes are: patient much more calm and less agitated. Overall min to mod assist with adls and mobility with PT and OT. Currently on D1 diet with thin liquids. Patient's medical and functional status update has been discussed with the Rehabilitation physician and patient remains appropriate for inpatient rehabilitation. Will admit to inpatient rehab today.  Preadmission Screen Completed By:  [name], 09/30/2012 11:55 AM ______________________________________________________________________   Discussed status with Dr. Wynn Banker on 09/30/12 at  1155 and received telephone approval for admission today.  Admission Coordinator:  [name], time 1155 Date 09/30/12.

## 2012-09-30 NOTE — Progress Notes (Signed)
Occupational Therapy Treatment Patient Details Name: Gerald Leach MRN: [id number] DOB: 1933/06/26 Today's Date: 09/30/2012 Time: 7829-5621 OT Time Calculation (min): 47 min  OT Assessment / Plan / Recommendation Comments on Treatment Session Pt making progress, however, continues to demo cognitive/safety deficits with confusion, becomes agitated with safety cues. Pt would continue to benefit from OT services to maximize level of function and safety    Follow Up Recommendations  CIR    Barriers to Discharge   pt's wife would not be able to provide adequate care for pt at home     Equipment Recommendations  3 in 1 bedside comode;Other (comment)    Recommendations for Other Services    Frequency Min 3X/week   Plan Discharge plan remains appropriate    Precautions / Restrictions Precautions Precautions: Fall Restrictions Weight Bearing Restrictions: No   Pertinent Vitals/Pain     ADL  Grooming: Performed;Wash/dry hands;Wash/dry face;Min guard Where Assessed - Grooming: Supported standing Upper Body Dressing: Performed;Set up;Supervision/safety Where Assessed - Upper Body Dressing: Supported sitting Lower Body Dressing: Performed;Minimal assistance;Moderate assistance Where Assessed - Lower Body Dressing: Supported sitting;Unsupported sitting Toilet Transfer: Performed;Minimal assistance Toilet Transfer Method: Sit to stand Toilet Transfer Equipment: Regular height toilet;Grab bars Toileting - Clothing Manipulation and Hygiene: Performed;Minimal assistance Where Assessed - Glass blower/designer Manipulation and Hygiene: Standing Equipment Used: Gait belt;Rolling walker Transfers/Ambulation Related to ADLs: Pt ambulating with RW pushed too far in advance. Pt needed constant v/c. Pt unware of lines and leads ADL Comments: pt became agitated with therapist for providing safety cues during functional moiblity and to wait for therapist before trying to stand up after toileting. Pt  required increased time to complete toileting and UB dressing tasks    OT Diagnosis:    OT Problem List:   OT Treatment Interventions:     OT Goals ADL Goals ADL Goal: Grooming - Progress: Progressing toward goals ADL Goal: Upper Body Dressing - Progress: Progressing toward goals ADL Goal: Lower Body Dressing - Progress: Progressing toward goals ADL Goal: Toilet Transfer - Progress: Progressing toward goals ADL Goal: Toileting - Hygiene - Progress: Progressing toward goals Miscellaneous OT Goals OT Goal: Miscellaneous Goal #1 - Progress: Not progressing  Visit Information  Last OT Received On: 09/30/12    Subjective Data  Subjective: " I been waiting for you for 3 hours " Patient Stated Goal: To retrun home   Prior Functioning       Cognition  Cognition Overall Cognitive Status: Impaired Area of Impairment: Attention;Memory;Following commands;Safety/judgement;Awareness of deficits;Problem solving Arousal/Alertness: Awake/alert Orientation Level: Appears intact for tasks assessed Behavior During Session: Agitated Current Attention Level: Focused;Sustained Following Commands: Follows multi-step commands inconsistently;Follows one step commands with increased time Safety/Judgement: Decreased awareness of safety precautions;Decreased safety judgement for tasks assessed;Impulsive;Decreased awareness of need for assistance Safety/Judgement - Other Comments: mod verbal cues for safety with walker     Mobility  Bed Mobility Bed Mobility: Supine to Sit;Sitting - Scoot to Edge of Bed Supine to Sit: 4: Min assist;HOB elevated Sitting - Scoot to [place] of Bed: 5: Supervision Transfers Transfers: Sit to Stand;Stand to Sit Sit to Stand: 4: Min assist;With upper extremity assist;From bed;From chair/3-in-1;From toilet Stand to Sit: To bed;To chair/3-in-1;To toilet;4: Min assist Details for Transfer Assistance: Max cues (pt still not following) for back completely up to chair and to  safely reach back with UEs to control descent. Pt stated " I know the chair is good and behind me ! "    Exercises  Balance Balance Balance Assessed: No   End of Session OT - End of Session Equipment Utilized During Treatment: Gait belt Activity Tolerance: Patient tolerated treatment well Patient left: in bed;with call bell/phone within reach;with family/visitor present  GO     Galen Manila 09/30/2012, 4:14 PM

## 2012-10-01 ENCOUNTER — Inpatient Hospital Stay (HOSPITAL_COMMUNITY): Payer: Medicare Other | Admitting: Speech Pathology

## 2012-10-01 ENCOUNTER — Inpatient Hospital Stay (HOSPITAL_COMMUNITY): Payer: Medicare Other | Admitting: Physical Therapy

## 2012-10-01 ENCOUNTER — Inpatient Hospital Stay (HOSPITAL_COMMUNITY): Payer: Medicare Other | Admitting: Occupational Therapy

## 2012-10-01 ENCOUNTER — Inpatient Hospital Stay (HOSPITAL_COMMUNITY): Payer: Medicare Other | Admitting: *Deleted

## 2012-10-01 DIAGNOSIS — I6200 Nontraumatic subdural hemorrhage, unspecified: Secondary | ICD-10-CM

## 2012-10-01 DIAGNOSIS — S065X9A Traumatic subdural hemorrhage with loss of consciousness of unspecified duration, initial encounter: Secondary | ICD-10-CM

## 2012-10-01 LAB — CBC WITH DIFFERENTIAL/PLATELET
Basophils Relative: 0 % (ref 0–1)
Eosinophils Absolute: 0.1 10*3/uL (ref 0.0–0.7)
Eosinophils Relative: 1 % (ref 0–5)
Hemoglobin: 13.1 g/dL (ref 13.0–17.0)
Lymphs Abs: 2.6 10*3/uL (ref 0.7–4.0)
MCH: 27.2 pg (ref 26.0–34.0)
MCHC: 33.8 g/dL (ref 30.0–36.0)
MCV: 80.7 fL (ref 78.0–100.0)
Monocytes Relative: 9 % (ref 3–12)
Neutrophils Relative %: 64 % (ref 43–77)
Platelets: 299 10*3/uL (ref 150–400)

## 2012-10-01 LAB — GLUCOSE, CAPILLARY
Glucose-Capillary: 105 mg/dL — ABNORMAL HIGH (ref 70–99)
Glucose-Capillary: 146 mg/dL — ABNORMAL HIGH (ref 70–99)
Glucose-Capillary: 224 mg/dL — ABNORMAL HIGH (ref 70–99)

## 2012-10-01 LAB — COMPREHENSIVE METABOLIC PANEL
Albumin: 2.7 g/dL — ABNORMAL LOW (ref 3.5–5.2)
Alkaline Phosphatase: 161 U/L — ABNORMAL HIGH (ref 39–117)
BUN: 12 mg/dL (ref 6–23)
Calcium: 9.1 mg/dL (ref 8.4–10.5)
GFR calc Af Amer: >90 mL/min (ref >90)
Glucose, Bld: 141 mg/dL — ABNORMAL HIGH (ref 70–99)
Potassium: 4.5 mEq/L (ref 3.5–5.1)
Total Protein: 6.5 g/dL (ref 6.0–8.3)

## 2012-10-01 MED ORDER — ENSURE PUDDING PO PUDG
1.0000 | Freq: Three times a day (TID) | ORAL | Status: DC
  Administered 2012-10-01 – 2012-10-06 (×14): 1 via ORAL

## 2012-10-01 MED ORDER — ADULT MULTIVITAMIN W/MINERALS CH
1.0000 | ORAL_TABLET | Freq: Every day | ORAL | Status: DC
  Administered 2012-10-01 – 2012-10-10 (×10): 1 via ORAL
  Filled 2012-10-01 (×11): qty 1

## 2012-10-01 MED ORDER — BIOTENE DRY MOUTH MT LIQD
15.0000 mL | Freq: Every day | OROMUCOSAL | Status: DC
Start: 2012-10-02 — End: 2012-10-10
  Administered 2012-10-02 – 2012-10-09 (×8): 15 mL via OROMUCOSAL

## 2012-10-01 NOTE — Evaluation (Signed)
Occupational Therapy Assessment and Plan  Patient Details  Name: Gerald Leach MRN: [id number] Date of Birth: 1934-04-26  OT Diagnosis: acute pain, cognitive deficits and muscle weakness (generalized) Rehab Potential: Rehab Potential: Good ELOS: 2 weeks   Today's Date: 10/01/2012 Time: 0900-1000 Time Calculation (min): 60 min  Problem List:  Patient Active Problem List  Diagnosis  . CARCINOMA, SKIN, SQUAMOUS CELL, FACE  . COLONIC POLYPS  . HYPOTHYROIDISM  . HYPERLIPIDEMIA  . HYPERTENSION  . GERD  . SOLAR KERATOSIS  . CARCINOMA, SKIN, SQUAMOUS CELL, FACE  . Diabetes mellitus type II, uncontrolled  . Facial laceration  . Contusion, chest wall  . Subdural hematoma  . Respiratory failure, post-operative  . S/P craniotomy  . Dysphagia  . Nontraumatic subdural hemorrhage    Past Medical History:  Past Medical History  Diagnosis Date  . Hypertension   . Colonic polyp 2003  . GERD (gastroesophageal reflux disease)   . ED (erectile dysfunction)   . Hyperlipidemia   . Hypothyroidism   . Brain aneurysm    Past Surgical History:  Past Surgical History  Procedure Laterality Date  . Brain aneurysm surgery      at [facility]  . Carotid artery aneurysm    . Colonoscopy      polyps  . Right hernia    . Craniotomy Right 09/19/2012    Procedure: CRANIOTOMY HEMATOMA EVACUATION SUBDURAL;  Surgeon: Clydene Fake, MD;  Location: [facility];  Service: Neurosurgery;  Laterality: Right;  . Craniotomy Right 09/22/2012    Procedure: CRANIOTOMY HEMATOMA EVACUATION SUBDURAL;  Surgeon: Clydene Fake, MD;  Location: [facility];  Service: Neurosurgery;  Laterality: Right;    Assessment & Plan Clinical Impression: Patient is a 79 y.o. year old male right-handed male with history of brain aneurysm that required surgery at [facility] 2002. Admitted 09/19/2012 with altered mental status and vomiting x24 hours. Pt had a fall 6 months PTA, down a flight of concrete stairs.  CT scan  imaging revealed subdural hematoma. Underwent right craniotomy hematoma evacuation 09/19/2012 per Dr. Phoebe Perch. Patient with increasing lethargy 09/22/2012 with followup cranial CT scan showing recurrent bleeding in the right subdural space and returned to the OR for redo right craniotomy for recurrent subdural hematoma 09/22/2012. Critical care medicine consulted postoperatively for acute respiratory issues patient was extubated 09/24/2012. Maintained on Keppra for seizure prophylaxis. Patient remained n.p.o. with nasogastric tube feeds for nutritional support and diet has now been advanced to a dysphagia 1 thin liquid.    Patient transferred to CIR on 09/30/2012 .  Pt currently demonstrates behavior consistent with Rancho Level VII.  Patient currently requires mod with basic self-care skills and min to mod A with functional mobtility secondary to muscle weakness, decreased cardiorespiratoy endurance, decreased visual perceptual skills and left inattention, decreased initiation, decreased attention, decreased awareness, decreased problem solving, decreased safety awareness and decreased memory and decreased standing balance, decreased postural control, decreased balance strategies and difficulty maintaining precautions.  Prior to hospitalization, patient could complete ADL with independent .  Patient will benefit from skilled intervention to decrease level of assist with basic self-care skills and increase independence with basic self-care skills prior to discharge home with care partner.  Anticipate patient will require 24 hour supervision and follow up home health and follow up outpatient.  OT - End of Session Activity Tolerance: Tolerates 30+ min activity with multiple rests Endurance Deficit: Yes OT Assessment Rehab Potential: Good OT Plan OT Intensity: Minimum of 1-2 x/day,  45 to 90 minutes OT Frequency: 5 out of 7 days OT Duration/Estimated Length of Stay: 2 weeks OT Treatment/Interventions:  Balance/vestibular training;Cognitive remediation/compensation;Discharge planning;Community reintegration;DME/adaptive equipment instruction;Functional mobility training;Neuromuscular re-education;Pain management;Psychosocial support;Patient/family education;Self Care/advanced ADL retraining;Therapeutic Activities;UE/LE Strength taining/ROM;Visual/perceptual remediation/compensation;UE/LE Coordination activities;Therapeutic Exercise OT Recommendation Patient destination: Home Follow Up Recommendations: Home health OT;Outpatient OT   Skilled Therapeutic Intervention   OT Evaluation Precautions/Restrictions  Precautions Precautions: Fall Restrictions Weight Bearing Restrictions: No General Chart Reviewed: Yes Family/Caregiver Present: No Vital Signs   Pain Pain Assessment Pain Assessment: PAINAD Faces Pain Scale: Hurts little more Pain Location: Head Pain Descriptors: Constant;Discomfort;Headache Pain Intervention(s): RN made aware;Rest Home Living/Prior Functioning Home Living Lives With: Spouse Available Help at Discharge: Available 24 hours/day (wife with recent back surgery) Type of Home: House Home Access: Level entry Home Layout: Multi-level Bathroom Shower/Tub: Engineer, manufacturing systems: Standard ADL   Vision/Perception  Vision - History Baseline Vision: Wears glasses only for reading Vision - Assessment Vision Assessment: Vision tested Alignment/Gaze Preference: Gaze right Perception Perception: Impaired Inattention/Neglect: Does not attend to left visual field Spatial Orientation: requires extra time to orient clothing to don proprerly Praxis Praxis: Intact  Cognition Overall Cognitive Status: Impaired/Different from baseline Arousal/Alertness: Awake/alert Orientation Level: Oriented to person;Oriented to place;Oriented to situation;Disoriented to time Attention: Focused;Sustained;Selective Focused Attention: Appears intact Sustained Attention:  Appears intact Selective Attention: Impaired Selective Attention Impairment: Verbal basic;Functional basic Memory: Impaired Memory Impairment: Decreased recall of new information Awareness: Impaired Awareness Impairment: Intellectual impairment;Emergent impairment Executive Function: Initiating;Self Monitoring;Self Correcting;Decision Making Decision Making: Impaired Decision Making Impairment: Verbal basic;Verbal complex Initiating: Impaired Initiating Impairment: Verbal basic;Functional basic Self Monitoring: Impaired Self Monitoring Impairment: Verbal basic;Functional basic Self Correcting: Impaired Self Correcting Impairment: Verbal basic;Functional basic Behaviors: Impulsive Safety/Judgment: Impaired Rancho Mirant Scales of Cognitive Functioning: Automatic/appropriate Sensation Sensation Light Touch: Impaired Detail Light Touch Impaired Details: Impaired LUE;Impaired LLE Proprioception: Impaired Detail Proprioception Impaired Details: Impaired LUE;Impaired LLE Coordination Gross Motor Movements are Fluid and Coordinated: No Fine Motor Movements are Fluid and Coordinated: No Coordination and Movement Description: left UE and LE with poor coordination Motor  Motor Motor - Skilled Clinical Observations: generalized weakness shuffling functional ambulation with bilateral UE support Mobility  Bed Mobility Supine to Sit: 4: Min assist Transfers Sit to Stand: 4: Min assist Stand to Sit: 4: Min assist  Trunk/Postural Assessment  Cervical Assessment Cervical Assessment: Within Functional Limits- forward flexed Thoracic Assessment Thoracic Assessment:  (slightly kyphotic) Lumbar Assessment Lumbar Assessment: Within Functional Limits Postural Control Postural Control: Deficits on evaluation Righting Reactions: delayed  Balance Static Sitting Balance Static Sitting - Level of Assistance: 5: Stand by assistance Dynamic Standing Balance Dynamic Standing - Balance  Support: During functional activity Dynamic Standing - Level of Assistance: 3: Mod assist;4: Min assist Extremity/Trunk Assessment RUE Assessment RUE Assessment: Exceptions to [facility] RUE Strength RUE Overall Strength:  (3+/5) LUE Assessment LUE Assessment: Exceptions to [facility] (3-/5)  FIM:  FIM - Grooming Grooming Steps: Wash, rinse, dry face;Wash, rinse, dry hands Grooming: 5: Set-up assist to obtain items FIM - Bathing Bathing Steps Patient Completed: Right Arm;Left Arm;Abdomen;Front perineal area Bathing: 2: Max-Patient completes 3-4 79f 10 parts or 25-49% FIM - Upper Body Dressing/Undressing Upper body dressing/undressing steps patient completed: Thread/unthread right sleeve of pullover shirt/dresss;Thread/unthread left sleeve of pullover shirt/dress;Put head through opening of pull over shirt/dress;Pull shirt over trunk Upper body dressing/undressing: 5: Set-up assist to: Obtain clothing/put away FIM - Lower Body Dressing/Undressing Lower body dressing/undressing steps patient completed: Thread/unthread right pants leg;Thread/unthread left pants leg;Pull  pants up/down;Don/Doff right sock;Don/Doff left sock Lower body dressing/undressing: 4: Min-Patient completed 75 plus % of tasks FIM - Bed/Chair Transfer Bed/Chair Transfer: 4: Supine > Sit: Min A (steadying Pt. > 75%/lift 1 leg);4: Sit > Supine: Min A (steadying pt. > 75%/lift 1 leg);4: Bed > Chair or W/C: Min A (steadying Pt. > 75%)   Refer to Care Plan for Long Term Goals  Recommendations for other services: Neuropsych  Discharge Criteria: Patient will be discharged from OT if patient refuses treatment 3 consecutive times without medical reason, if treatment goals not met, if there is a change in medical status, if patient makes no progress towards goals or if patient is discharged from hospital.  The above assessment, treatment plan, treatment alternatives and goals were discussed and mutually agreed upon: by patient  1:1 OT eval  initiated with Ot purpose, role and goals discussed. When arrived pt laying flat in bed drinking OJ; pt had OJ all down his gown and pt and bed were soak with urine. Pt unaware of being wet. Self care retraining at sink level with focus on basic transfers, sit to stands standing balance, task organization, attention to left field, self feeding with mod cuing for oral hygiene of spillage out left side of mouth, orientation and simple problem solving.   Roney Mans [facility] 10/01/2012, 12:02 PM

## 2012-10-01 NOTE — Progress Notes (Signed)
Occupational Therapy Session Note  Patient Details  Name: Gerald Leach MRN: [id number] Date of Birth: [birth year]  Today's Date: 10/01/2012 Time: 1105-1200 Time Calculation (min): 55 min  Short Term Goals: Week 1:  OT Short Term Goal 1 (Week 1): Pt will demonstration orientation x4 with environmental cues OT Short Term Goal 2 (Week 1): Pt will don LB clothing with supervision OT Short Term Goal 3 (Week 1): Pt will selective attention in a moderate distracting environment with min cuing OT Short Term Goal 4 (Week 1): shower stall transfer with min A with appropriate DME  Skilled Therapeutic Interventions/Progress Updates:  Balance/vestibular training;Cognitive remediation/compensation;Discharge planning;Community reintegration;DME/adaptive equipment instruction;Functional mobility training;Neuromuscular re-education;Pain management;Psychosocial support;Patient/family education;Self Care/advanced ADL retraining;Therapeutic Activities;UE/LE Strength taining/ROM;Visual/perceptual remediation/compensation;UE/LE Coordination activities;Therapeutic Exercise   1:1 Cognitive retraining and self care retraining. Focus on orientation information in written form, attention to left environment with line bisection task and then pipe tree task of making design from a picture. Pt missed 7/19 (all due to left inattention). Pt oriented x3 with only missing the date by one day.  Pt able to perform moderate difficult pipe tree picture- with min questioning cues for problem solving and correction of errors. Pt required more than reasonable time to complete the task. Performed functional ambulation around the dayroom and RN station with bilateral UE support. Pt with forward posture and shuffling steps - difficulty with picking up left LE- scooting it along the floor. Pt with slow speed and difficulty maintaining a normal size step.   Therapy Documentation Precautions:  Precautions Precautions:  Fall Restrictions Weight Bearing Restrictions: No General: General Chart Reviewed: Yes Family/Caregiver Present: No    Pain: Pain Assessment Pain Assessment: PAINAD Faces Pain Scale: Hurts little more Pain Location: Head Pain Descriptors: Constant;Discomfort;Headache Pain Intervention(s): RN made aware;Rest  See FIM for current functional status  Therapy/Group: Individual Therapy  Roney Mans [facility] 10/01/2012, 12:04 PM

## 2012-10-01 NOTE — Progress Notes (Signed)
Physical Therapy Note  Patient Details  Name: COLON RUETH MRN: [id number] Date of Birth: [birth year] Today's Date: 10/01/2012  1430-1450 (20 minutes) individual Pain: no reported pain Focus of treatment: bilateral LE strengthening Treatment: Pt in bed upon arrival with eyes closed. Pt agreeable to bedside bilateral LE strengthening exercises X 15 ( hip flexion/extension, hip abduction , ankle pumps); maintains eyes closed and required mod vcs to attend to task.   Doshie Maggi,JIM 10/01/2012, 2:56 PM

## 2012-10-01 NOTE — Progress Notes (Signed)
[name], male   DOB: 1934-01-04, 79 y.o.   MRN: [id number] Subjective/Complaints: Slept ok Oriented to person place Review of Systems  Neurological: Positive for headaches.  All other systems reviewed and are negative.    Objective: Vital Signs: Blood pressure 121/79, pulse 63, temperature 98.3 F (36.8 C), temperature source Oral, resp. rate 18, height 6' (1.829 m), weight 81.5 kg (179 lb 10.8 oz), SpO2 97.00%. No results found. Results for orders placed during the hospital encounter of 09/30/12 (from the past 72 hour(s))  GLUCOSE, CAPILLARY     Status: Abnormal   Collection Time    09/30/12  8:58 PM      Result Value Range   Glucose-Capillary 205 (*) 70 - 99 mg/dL   Comment 1 Notify RN    CBC WITH DIFFERENTIAL     Status: Abnormal   Collection Time    10/01/12  6:10 AM      Result Value Range   WBC 9.9  4.0 - 10.5 K/uL   RBC 4.81  4.22 - 5.81 MIL/uL   Hemoglobin 13.1  13.0 - 17.0 g/dL   HCT 40.9 (*) 81.1 - 91.4 %   MCV 80.7  78.0 - 100.0 fL   MCH 27.2  26.0 - 34.0 pg   MCHC 33.8  30.0 - 36.0 g/dL   RDW 78.2  95.6 - 21.3 %   Platelets 299  150 - 400 K/uL   Neutrophils Relative 64  43 - 77 %   Neutro Abs 6.3  1.7 - 7.7 K/uL   Lymphocytes Relative 26  12 - 46 %   Lymphs Abs 2.6  0.7 - 4.0 K/uL   Monocytes Relative 9  3 - 12 %   Monocytes Absolute 0.8  0.1 - 1.0 K/uL   Eosinophils Relative 1  0 - 5 %   Eosinophils Absolute 0.1  0.0 - 0.7 K/uL   Basophils Relative 0  0 - 1 %   Basophils Absolute 0.0  0.0 - 0.1 K/uL     HEENT: normal and scalp incision without hematoma Cardio: RRR and no murmurs Resp: CTA B/L and unlabored GI: BS positive and non tender Extremity:  Pulses positive and No Edema Skin:   Wound C/D/I Neuro: Lethargic, Cranial Nerve II-XII normal, Abnormal Sensory unable to assess due to reduced attn and Abnormal FMC Ataxic/ dec FMC Musc/Skel:  Normal GEN: NAD 4/5 strength in bilateral deltoid, biceps, triceps, grip, hip flexor, knee  extensors, ankle dorsiflexor plantar flexor  Sensation unable to assess secondary to decreased attention    Assessment/Plan: 1. Functional deficits secondary to R SDH which require 3+ hours per day of interdisciplinary therapy in a comprehensive inpatient rehab setting. Physiatrist is providing close team supervision and 24 hour management of active medical problems listed below. Physiatrist and rehab team continue to assess barriers to discharge/monitor patient progress toward functional and medical goals. FIM:                   Comprehension Comprehension Mode: Auditory Comprehension: 4-Understands basic 75 - 89% of the time/requires cueing 10 - 24% of the time  Expression Expression Mode: Verbal Expression: 3-Expresses basic 50 - 74% of the time/requires cueing 25 - 50% of the time. Needs to repeat parts of sentences.     Problem Solving Problem Solving: 3-Solves basic 50 - 74% of the time/requires cueing 25 - 49% of the time  Memory Memory: 2-Recognizes or recalls 25 - 49% of the time/requires cueing 51 -  75% of the time Medical Problem List and Plan:  1. Right subdural hematoma. Status post craniotomy evacuation of hematoma 09/19/2012 and redo craniotomy 09/22/2012 for recurrent subdural hematoma  2. DVT Prophylaxis/Anticoagulation: SCDs. Monitor for any signs of DVT  3. Mood: Xanax 0.25 mg twice a day. Check sleep chart  4. Dysphagia. Dysphagia 1 thin liquid diet. Monitor for signs of aspiration. Followup speech therapy  5. Neuropsych: This patient is not capable of making decisions on his/her own behalf.  6. Seizure prophylaxis. Keppra 500 mg twice a day  7. Hypertension. Tenormin 12.5 mg daily. Monitor with increased activity  8. Hypothyroidism. Synthroid  9. Non-insulin-dependent diabetes mellitus. Latest documented hemoglobin A1c of 6.3. Check blood sugars a.c. and at bedtime. Patient on Glucophage 500 mg twice a day prior to admission  10. GERD. Protonix  11.  Hyperlipidemia. Zocor     LOS (Days) 1 A FACE TO FACE EVALUATION WAS PERFORMED  Makena Murdock E 10/01/2012, 7:12 AM

## 2012-10-01 NOTE — Evaluation (Signed)
Speech Language Pathology Assessment and Plan  Patient Details  Name: Gerald Leach MRN: [id number] Date of Birth: [birth year]  SLP Diagnosis: Dysphagia;Dysarthria;Cognitive Impairments  Rehab Potential: Good ELOS: 2 weeks   Today's Date: 10/01/2012 Time: 0900-1000 Time Calculation (min): 60 min  Skilled Therapeutic Intervention: Administered cognitive-linguistic evaluation and BSE. Please see below for details.   Problem List:  Patient Active Problem List  Diagnosis  . CARCINOMA, SKIN, SQUAMOUS CELL, FACE  . COLONIC POLYPS  . HYPOTHYROIDISM  . HYPERLIPIDEMIA  . HYPERTENSION  . GERD  . SOLAR KERATOSIS  . CARCINOMA, SKIN, SQUAMOUS CELL, FACE  . Diabetes mellitus type II, uncontrolled  . Facial laceration  . Contusion, chest wall  . Subdural hematoma  . Respiratory failure, post-operative  . S/P craniotomy  . Dysphagia  . Nontraumatic subdural hemorrhage   Past Medical History:  Past Medical History  Diagnosis Date  . Hypertension   . Colonic polyp 2003  . GERD (gastroesophageal reflux disease)   . ED (erectile dysfunction)   . Hyperlipidemia   . Hypothyroidism   . Brain aneurysm    Past Surgical History:  Past Surgical History  Procedure Laterality Date  . Brain aneurysm surgery      at [facility]  . Carotid artery aneurysm    . Colonoscopy      polyps  . Right hernia    . Craniotomy Right 09/19/2012    Procedure: CRANIOTOMY HEMATOMA EVACUATION SUBDURAL;  Surgeon: Clydene Fake, MD;  Location: [facility];  Service: Neurosurgery;  Laterality: Right;  . Craniotomy Right 09/22/2012    Procedure: CRANIOTOMY HEMATOMA EVACUATION SUBDURAL;  Surgeon: Clydene Fake, MD;  Location: [facility];  Service: Neurosurgery;  Laterality: Right;    Assessment / Plan / Recommendation Clinical Impression  Patient is a 79 y.o. year old right-handed male with history of brain aneurysm that required surgery at [facility] 2002. Admitted 09/19/2012 with altered mental  status and vomiting x24 hours. Pt had a fall 6 months PTA, down a flight of concrete stairs. CT scan imaging revealed subdural hematoma. Underwent right craniotomy hematoma evacuation 09/19/2012 per Dr. Phoebe Perch. Patient with increasing lethargy 09/22/2012 with followup cranial CT scan showing recurrent bleeding in the right subdural space and returned to the OR for redo right craniotomy for recurrent subdural hematoma 09/22/2012. Critical care medicine consulted postoperatively for acute respiratory issues patient was extubated 09/24/2012. Maintained on Keppra for seizure prophylaxis. Patient remained n.p.o. with nasogastric tube feeds for nutritional support and diet has now been advanced to a dysphagia 1 textures with thin liquid. Patient transferred to CIR on 09/30/2012 and demonstrates behavior consistent with Rancho Level VII characterized by lethargy, left inattention, decreased initiation, decreased attention, decreased awareness, decreased problem solving, decreased safety awareness and decreased memory which impact pt's overall functional independence. Pt also presents with mild dysarthria characterized by decreased lingual and labial ROM and strength. Pt's overall oral weakness also impact pt's ability to safely masticate solid textures and recommend pt continue with Dys. 1 textures and thin liquids via cup with full supervision. Patient will benefit from skilled SLP intervention to maximize cognitive recovery, swallowing function with least restrictive diet and functional communication prior to discharge home with care partner. Anticipate patient will require 24 hour supervision and follow up home health intervention.     SLP Assessment  Patient will need skilled Speech Lanaguage Pathology Services during CIR admission    Recommendations  Diet Recommendations: Dysphagia 1 (Puree);Thin liquid Liquid Administration via: Cup;No straw  Medication Administration: Crushed with puree Supervision: Patient  able to self feed;Full supervision/cueing for compensatory strategies Compensations: Slow rate;Small sips/bites;Check for pocketing;Clear throat intermittently;Check for anterior loss Postural Changes and/or Swallow Maneuvers: Seated upright 90 degrees;Upright 30-60 min after meal Oral Care Recommendations: Staff/trained caregiver to provide oral care;Oral care QID Recommendations for Other Services: Neuropsych consult Patient destination: Home Follow up Recommendations: Home Health SLP;24 hour supervision/assistance Equipment Recommended: None recommended by SLP    SLP Frequency 5 out of 7 days   SLP Treatment/Interventions Cueing hierarchy;Cognitive remediation/compensation;Environmental controls;Internal/external aids;Oral motor exercises;Patient/family education;Speech/Language facilitation;Functional tasks;Dysphagia/aspiration precaution training;Therapeutic Activities    Pain Pain Assessment Pain Assessment: PAINAD Faces Pain Scale: Hurts little more Pain Location: Head Pain Descriptors: Constant;Discomfort;Headache Pain Intervention(s): RN made aware;Rest Prior Functioning Type of Home: House Lives With: Spouse Available Help at Discharge: Available 24 hours/day (wife with recent back surgery)  Short Term Goals: Week 1: SLP Short Term Goal 1 (Week 1): Pt will demonstrate sustained attention to task for 30 minutes with supervision verbal cues for redirection  SLP Short Term Goal 2 (Week 1): Pt will utilize external memory aids to increase recall of new, daily information with Min A verbal and question cues.  SLP Short Term Goal 3 (Week 1): Pt will identify 2 cognitive deficits with Min A semantic and question cues.  SLP Short Term Goal 4 (Week 1): Pt will demonstrate functional problem solving for basic and familiar tasks with Min A verbal cues.  SLP Short Term Goal 5 (Week 1): Pt will utilize swallowing compensatory strategies with supervision verbal cues to minimize overt s/s of  aspiration   See FIM for current functional status Refer to Care Plan for Long Term Goals  Recommendations for other services: Neuropsych  Discharge Criteria: Patient will be discharged from SLP if patient refuses treatment 3 consecutive times without medical reason, if treatment goals not met, if there is a change in medical status, if patient makes no progress towards goals or if patient is discharged from hospital.  The above assessment, treatment plan, treatment alternatives and goals were discussed and mutually agreed upon: by patient  Jermesha Sottile 10/01/2012, 10:20 AM

## 2012-10-01 NOTE — Evaluation (Signed)
Physical Therapy Assessment and Plan  Patient Details  Name: Gerald Leach MRN: [id number] Date of Birth: [birth year]  PT Diagnosis: Abnormal posture, Abnormality of gait, Cognitive deficits, Coordination disorder, Impaired cognition, Impaired sensation and Muscle weakness Rehab Potential: Good ELOS: 2 weeks   Today's Date: 10/01/2012 Time: 1300-1405 Time Calculation (min): 65 min  Problem List:  Patient Active Problem List  Diagnosis  . CARCINOMA, SKIN, SQUAMOUS CELL, FACE  . COLONIC POLYPS  . HYPOTHYROIDISM  . HYPERLIPIDEMIA  . HYPERTENSION  . GERD  . SOLAR KERATOSIS  . CARCINOMA, SKIN, SQUAMOUS CELL, FACE  . Diabetes mellitus type II, uncontrolled  . Facial laceration  . Contusion, chest wall  . Subdural hematoma  . Respiratory failure, post-operative  . S/P craniotomy  . Dysphagia  . Nontraumatic subdural hemorrhage    Past Medical History:  Past Medical History  Diagnosis Date  . Hypertension   . Colonic polyp 2003  . GERD (gastroesophageal reflux disease)   . ED (erectile dysfunction)   . Hyperlipidemia   . Hypothyroidism   . Brain aneurysm    Past Surgical History:  Past Surgical History  Procedure Laterality Date  . Brain aneurysm surgery      at [facility]  . Carotid artery aneurysm    . Colonoscopy      polyps  . Right hernia    . Craniotomy Right 09/19/2012    Procedure: CRANIOTOMY HEMATOMA EVACUATION SUBDURAL;  Surgeon: Clydene Fake, MD;  Location: [facility];  Service: Neurosurgery;  Laterality: Right;  . Craniotomy Right 09/22/2012    Procedure: CRANIOTOMY HEMATOMA EVACUATION SUBDURAL;  Surgeon: Clydene Fake, MD;  Location: [facility];  Service: Neurosurgery;  Laterality: Right;    Assessment & Plan Clinical Impression: Gerald Leach is a 79 y.o. right-handed male with history of brain aneurysm that required surgery at [facility] 2002. Admitted 09/19/2012 with altered mental status and vomiting x24 hours. CT scan imaging revealed  subdural hematoma. Underwent right craniotomy hematoma evacuation 09/19/2012 per Dr. Phoebe Perch. Patient with increasing lethargy 09/22/2012 with followup cranial CT scan showing recurrent bleeding in the right subdural space and returned to the OR for redo right craniotomy for recurrent subdural hematoma 09/22/2012. Critical care medicine consulted postoperatively for acute respiratory issues patient was extubated 09/24/2012. Maintained on Keppra for seizure prophylaxis. Patient remained n.p.o. with nasogastric tube feeds for nutritional support and diet has now been advanced to a dysphagia 1 thin liquid. Physical therapy evaluation completed an ongoing noted issues in regards to being impulsive, restless and agitated. Recommendations are made for physical medicine rehabilitation consult to consider inpatient rehabilitation services. Patient was felt to be a candidate for inpatient rehabilitation services and was admitted for a comprehensive rehabilitation program. Patient transferred to CIR on 09/30/2012 .   Patient currently requires mod with mobility secondary to muscle weakness, decreased cardiorespiratoy endurance, impaired timing and sequencing, unbalanced muscle activation, decreased coordination and decreased motor planning, na, decreased attention to left and decreased motor planning, decreased initiation, decreased attention, decreased awareness, decreased problem solving, decreased safety awareness, decreased memory and delayed processing and decreased sitting balance, decreased standing balance, decreased postural control and decreased balance strategies.  Prior to hospitalization, patient was modified independent  with mobility and lived with Spouse in a House home.  Home access is  Level entry.  Patient will benefit from skilled PT intervention to maximize safe functional mobility, minimize fall risk and decrease caregiver burden for planned discharge home with 24 hour supervision.  Anticipate patient  will benefit from follow up HH at discharge.  PT - End of Session Activity Tolerance: Tolerates 30+ min activity with multiple rests Endurance Deficit: Yes Endurance Deficit Description: Patient presents lethargic PT Assessment Rehab Potential: Good Barriers to Discharge: Decreased caregiver support PT Plan PT Intensity: Minimum of 1-2 x/day ,45 to 90 minutes PT Frequency: 5 out of 7 days PT Duration Estimated Length of Stay: 2 weeks PT Treatment/Interventions: Ambulation/gait training;Balance/vestibular training;Cognitive remediation/compensation;Community reintegration;Discharge planning;Neuromuscular re-education;Functional mobility training;DME/adaptive equipment instruction;Pain management;Patient/family education;Psychosocial support;UE/LE Coordination activities;UE/LE Strength taining/ROM;Therapeutic Exercise;Therapeutic Activities;Stair training;Visual/perceptual remediation/compensation;Wheelchair propulsion/positioning PT Recommendation Recommendations for Other Services: Speech consult Follow Up Recommendations: Home health PT;Outpatient PT Patient destination: Home Equipment Recommended: None recommended by PT Equipment Details: DME assessment ongoing, recommendations TBD upon discharge  Skilled Therapeutic Intervention Skilled therapeutic intervention initiated after completion of evaluation. Patient instructed in gait training with a straight cane 55' x1 and 82' x1 with mod assist. Patient tending to reach for handrails, doors, grab bars with L UE with cane in R UE for additional support. May want to attempt gait training with rolling walker. Patient with reports of having to use the restroom. Patient ambulated with straight cane to bathroom and is able to manage clothing and perform hygiene after bowel movement with min assist for balance. Patient demonstrates perseveration with toilet paper and requires verbal cues to discontinue hygiene. Patient left supine in bed with bed  alarm on and all needs within reach, nurse tech present.  PT Evaluation Precautions/Restrictions Precautions Precautions: Fall Restrictions Weight Bearing Restrictions: No General Chart Reviewed: Yes Amount of Missed PT Time (min): 10 Minutes Missed Time Reason: Patient fatigue  Pain Pain Assessment Pain Assessment: No/denies pain Pain Score: 0-No pain Home Living/Prior Functioning Home Living Lives With: Spouse Available Help at Discharge: Available 24 hours/day (wife had recent back surgery) Type of Home: House Home Access: Level entry Home Layout: Multi-level Alternate Level Stairs-Number of Steps: Full flight Alternate Level Stairs-Rails: None Bathroom Shower/Tub: Engineer, manufacturing systems: Standard Home Adaptive Equipment: Straight cane;Walker - rolling Additional Comments: Patient fell 6 months ago and has used cane since that time. 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 Coordination Gross Motor Movements are Fluid and Coordinated: No Fine Motor Movements are Fluid and Coordinated: No Coordination and Movement Description: Decreased speed and accuracy with rapid, alternating movements Heel Shin  Test: No dysmetria, delayed processing Motor  Motor Motor: Abnormal postural alignment and control Motor - Skilled Clinical Observations: Delayed motor processing  Mobility Bed Mobility Bed Mobility: Sit to Supine Sit to Supine: 5: Supervision;HOB flat Sit to Supine - Details: Verbal cues for sequencing;Verbal cues for technique;Verbal cues for precautions/safety Transfers Sit to Stand: 4: Min assist;From chair/3-in-1;From toilet;From bed;With armrests;With upper extremity assist Sit to Stand Details: Verbal cues for sequencing;Verbal cues for technique;Verbal cues for precautions/safety;Manual facilitation for weight shifting;Tactile cues for initiation Stand to Sit: 4: Min assist;With armrests;With upper extremity assist;To toilet;To chair/3-in-1;To bed Stand to Sit Details (indicate  cue type and reason): Tactile cues for initiation;Manual facilitation for weight shifting;Verbal cues for precautions/safety;Verbal cues for technique;Verbal cues for sequencing Locomotion  Ambulation Ambulation: Yes Ambulation/Gait Assistance: 3: Mod assist Ambulation Distance (Feet): 50 Feet Assistive device: 1 person hand held assist;Other (Comment) (and handrail) Ambulation/Gait Assistance Details: Tactile cues for initiation;Manual facilitation for weight shifting;Verbal cues for gait pattern;Verbal cues for precautions/safety;Verbal cues for sequencing;Verbal cues for technique Ambulation/Gait Assistance Details: Patient instructed in gait training in controlled environment 50' x1 (25' with L handheld and R handrail, 25' with R handheld and L handrail). Gait Gait: Yes Gait Pattern: Step-through pattern;Decreased stride length;Left flexed knee in stance;Right flexed knee in stance;Lateral trunk lean to left;Wide base of support;Trunk flexed;Shuffle Stairs / Additional Locomotion Stairs: Yes Stairs Assistance: 4: Min assist Stairs Assistance Details: Tactile cues for initiation;Verbal cues for  sequencing;Verbal cues for technique;Verbal cues for precautions/safety;Visual cues/gestures for sequencing;Manual facilitation for weight shifting Stairs Assistance Details (indicate cue type and reason): Patient requires verbal cues for placement of whole foot on step. Stair Management Technique: Two rails;Step to pattern;Forwards Number of Stairs: 5 Height of Stairs: 6 Wheelchair Mobility Wheelchair Mobility: No  Trunk/Postural Assessment  Cervical Assessment Cervical Assessment: Within Functional Limits Thoracic Assessment Thoracic Assessment: Within Functional Limits Lumbar Assessment Lumbar Assessment: Within Functional Limits Postural Control Postural Control: Deficits on evaluation Righting Reactions: delayed  Balance Balance Balance Assessed: Yes Static Sitting Balance Static Sitting - Balance Support: No upper extremity supported;Feet supported Static Sitting - Level of Assistance: 5: Stand by assistance Static Standing Balance Static Standing - Balance Support: Right upper extremity supported;Left upper extremity supported;Bilateral upper extremity supported;No upper extremity supported;During functional activity Static Standing - Level of Assistance: 4: Min assist;3: Mod assist Dynamic Standing Balance Dynamic Standing - Balance Support: During functional activity;Left upper extremity supported;Right upper extremity supported Dynamic Standing - Level of Assistance: 3: Mod assist Dynamic Standing - Comments: Patient with forward flexed posture in standing. Extremity Assessment  RLE Assessment RLE Assessment: Within Functional Limits LLE Assessment LLE Assessment: Exceptions to [facility] LLE Strength LLE Overall Strength: Deficits;Due to impaired cognition LLE Overall Strength Comments: Patient demonstrates difficulty with command following during MMT, but is able to follow commands with repetition. Grossly 3/5 to 3+/5  FIM:  FIM - Landscape architect Devices: Arm rests Bed/Chair Transfer: 5: Sit > Supine: Supervision (verbal cues/safety issues);4: Chair or W/C > Bed: Min A (steadying Pt. > 75%);4: Bed > Chair or W/C: Min A (steadying Pt. > 75%) FIM - Locomotion: Wheelchair Locomotion: Wheelchair: 1: Total Assistance/staff pushes wheelchair (Pt<25%) FIM - Locomotion: Ambulation Locomotion: Ambulation Assistive Devices: Cane - Straight (handheld assist) Ambulation/Gait Assistance: 3: Mod assist Locomotion: Ambulation: 2: Travels 50 - 149 ft with moderate assistance (Pt: 50 - 74%) FIM - Locomotion: Stairs Locomotion: Building control surveyor: Hand rail - 2 Locomotion: Stairs: 2: Up and Down 4 - 11 stairs with minimal assistance (Pt.>75%)   Refer to Care Plan for Long Term Goals  Recommendations for other services: None  Discharge Criteria: Patient will be discharged from PT if patient refuses treatment 3 consecutive times without medical reason, if treatment goals not met, if there is a change in medical status, if patient makes no progress towards goals or if patient is discharged from hospital.  The above assessment, treatment plan, treatment alternatives and goals were discussed and mutually agreed upon: by patient  Chipper Herb. Izayiah Tibbitts, PT, DPT  10/01/2012, 4:38 PM

## 2012-10-01 NOTE — Plan of Care (Signed)
Overall Plan of Care [facility]) Patient Details Name: Gerald Leach MRN: [id number] DOB: February 06, 1934  Diagnosis:  Right SDH  Co-morbidities: htn, DM, gERD, dysphagia  Functional Problem List  Patient demonstrates impairments in the following areas: Balance, Behavior, Bladder, Bowel, Cognition, Endurance, Medication Management, Motor, Pain, Safety, Skin Integrity and Vision  Basic ADL's: eating, grooming, bathing, dressing and toileting Advanced ADL's: simple meal preparation  Transfers:  bed mobility, bed to chair, toilet, tub/shower, car, furniture and floor Locomotion:  ambulation, wheelchair mobility and stairs  Additional Impairments:  Functional use of upper extremity, Swallowing, Communication  expression, Social Cognition   social interaction, problem solving, memory, attention and awareness, Leisure Awareness and Discharge Disposition  Anticipated Outcomes Item Anticipated Outcome  Eating/Swallowing  Supervision with least restrictive diet  Basic self-care  supervision  Tolieting  supervision  Bowel/Bladder  Continent of Bowel/Bladder  Transfers  supervision  Locomotion  Supervision 150' with LRAD, 12 stairs with handrail S and wheelchair mobility  Communication    Cognition  Supervision  Pain  Less than or equal to 3.  Safety/Judgment  Supervision  Other     Therapy Plan: PT Intensity: Minimum of 1-2 x/day ,45 to 90 minutes PT Frequency: 5 out of 7 days PT Duration Estimated Length of Stay: 2 weeks OT Intensity: Minimum of 1-2 x/day, 45 to 90 minutes OT Frequency: 5 out of 7 days OT Duration/Estimated Length of Stay: 2 weeks SLP Intensity: Minumum of 1-2 x/day, 30 to 90 minutes SLP Frequency: 5 out of 7 days SLP Duration/Estimated Length of Stay: 2 weeks    Team Interventions: Item RN PT OT SLP SW TR Other  Self Care/Advanced ADL Retraining   x      Neuromuscular Re-Education  x x      Therapeutic Activities  x x x     UE/LE Strength Training/ROM  x x       UE/LE Coordination Activities  x x      Visual/Perceptual Remediation/Compensation  x x      DME/Adaptive Equipment Instruction  x x      Therapeutic Exercise  x x      Balance/Vestibular Training  x x      Patient/Family Education x x x x     Cognitive Remediation/Compensation  x x x     Functional Mobility Training  x x      Ambulation/Gait Training  x       Stair Training  x       Wheelchair Propulsion/Positioning  x       Functional Tourist information centre manager Reintegration  x x      Dysphagia/Aspiration Printmaker x   x     Speech/Language Facilitation    x     Bladder Management x        Bowel Management x        Disease Management/Prevention x        Pain Management x x x      Medication Management x        Skin Care/Wound Management x        Splinting/Orthotics  x x      Discharge Planning  x x x     Psychosocial Support x x x x                            Team Discharge Planning: Destination: PT-Home ,OT-  Home , SLP-Home Projected Follow-up: PT-Home health PT;Outpatient PT, OT-  Home health OT;Outpatient OT, SLP-Home Health SLP;24 hour supervision/assistance Projected Equipment Needs: PT-None recommended by PT, OT-  , SLP-None recommended by SLP Patient/family involved in discharge planning: PT- Patient,  OT-Patient, SLP-Patient  MD ELOS: 2 weeks Medical Rehab Prognosis:  Excellent Assessment: The patient has been admitted for CIR therapies. The team will be addressing, functional mobility, strength, stamina, balance, safety, adaptive techniques/equipment, self-care, bowel and bladder mgt, patient and caregiver education, cognition, communication, NMR, CPT. Goals have been set at supervision.    Ranelle Oyster, MD, FAAPMR      See Team Conference Notes for weekly updates to the plan of careOverall Plan of Care [facility])

## 2012-10-01 NOTE — Progress Notes (Signed)
INITIAL NUTRITION ASSESSMENT  DOCUMENTATION CODES Per approved criteria  -Not Applicable   INTERVENTION: 1. Add Ensure Pudding po TID, each supplement provides 170 kcal and 4 grams of protein.  2. Add MVI daily 3. RD to continue to follow nutrition care plan  NUTRITION DIAGNOSIS: Increased nutrient needs related to acute injury as evidenced by estimated needs.   Goal: Intake to meet >90% of estimated nutrition needs.  Monitor:  weight trends, lab trends, I/O's, PO intake, supplement tolerance  Reason for Assessment: Health History  79 y.o. male  Admitting Dx: Nontraumatic subdural hemorrhage  ASSESSMENT: Admitted 4/4 with AMS and vomiting. CT revealed subdural hematoma. Pt remained NPO with NGT for nutrition support, diet advanced to Dysphagia 2 with thins. Pt was followed by RD staff during acute hospitalization. Pt was started on Magic Cup supplements.  Pt reports usual weight is 185 lb. States that his appetite is normally good, but not great at this time. Currently eating 100% of meals. Likes pudding and ice cream.  Height: Ht Readings from Last 1 Encounters:  09/30/12 6' (1.829 m)    Weight: Wt Readings from Last 1 Encounters:  09/30/12 179 lb 10.8 oz (81.5 kg)    Ideal Body Weight: 178 lb  % Ideal Body Weight: 101%  Wt Readings from Last 10 Encounters:  09/30/12 179 lb 10.8 oz (81.5 kg)  09/26/12 165 lb 12.6 oz (75.2 kg)  09/26/12 165 lb 12.6 oz (75.2 kg)  09/26/12 165 lb 12.6 oz (75.2 kg)  06/04/12 189 lb (85.73 kg)  05/02/12 183 lb (83.008 kg)  03/17/12 183 lb (83.008 kg)  03/03/12 185 lb (83.915 kg)  08/28/11 194 lb 4.8 oz (88.134 kg)  07/25/11 198 lb (89.812 kg)    Usual Body Weight: 185 lb  % Usual Body Weight: 97%  BMI:  Body mass index is 24.36 kg/(m^2). WNL  Estimated Nutritional Needs: Kcal: 1900 - 2200 kcal Protein: 98 - 110 grams Fluid: 1.9 - 2.2 liters  Skin: R head incision  Diet Order: Dysphagia 1 with thins  EDUCATION  NEEDS: -No education needs identified at this time   Intake/Output Summary (Last 24 hours) at 10/01/12 0933 Last data filed at 10/01/12 0800  Gross per 24 hour  Intake    720 ml  Output    425 ml  Net    295 ml    Last BM: 4/15  Labs:   Recent Labs Lab 09/25/12 0335 09/26/12 0610 09/27/12 0500 10/01/12 0610  NA 135 138 136 137  K 3.5 4.0 3.9 4.5  CL 96 100 96 102  CO2 27 30 30 26   BUN 26* 30* 26* 12  CREATININE 0.73 0.79 0.72 0.65  CALCIUM 9.0 9.6 9.3 9.1  MG 2.5 2.7* 2.2  --   PHOS 5.0* 3.8 3.4  --   GLUCOSE 132* 154* 143* 141*    CBG (last 3)   Recent Labs  09/30/12 1139 09/30/12 2058 10/01/12 0724  GLUCAP 175* 205* 146*    Scheduled Meds: . ALPRAZolam  0.25 mg Oral BID  . antiseptic oral rinse  15 mL Mouth Rinse BID  . atenolol  12.5 mg Oral Daily  . insulin aspart  0-15 Units Subcutaneous TID WC  . levETIRAcetam  500 mg Oral BID  . levothyroxine  50 mcg Oral QAC breakfast  . pantoprazole sodium  40 mg Oral Daily  . simvastatin  10 mg Oral QHS    Continuous Infusions:   Past Medical History  Diagnosis Date  .  Hypertension   . Colonic polyp 2003  . GERD (gastroesophageal reflux disease)   . ED (erectile dysfunction)   . Hyperlipidemia   . Hypothyroidism   . Brain aneurysm     Past Surgical History  Procedure Laterality Date  . Brain aneurysm surgery      at [facility]  . Carotid artery aneurysm    . Colonoscopy      polyps  . Right hernia    . Craniotomy Right 09/19/2012    Procedure: CRANIOTOMY HEMATOMA EVACUATION SUBDURAL;  Surgeon: Clydene Fake, MD;  Location: [facility];  Service: Neurosurgery;  Laterality: Right;  . Craniotomy Right 09/22/2012    Procedure: CRANIOTOMY HEMATOMA EVACUATION SUBDURAL;  Surgeon: Clydene Fake, MD;  Location: [facility];  Service: Neurosurgery;  Laterality: Right;    Jarold Motto MS, RD, LDN Pager: [phone] After-hours pager: [phone]

## 2012-10-02 ENCOUNTER — Inpatient Hospital Stay (HOSPITAL_COMMUNITY): Payer: Medicare Other | Admitting: Physical Therapy

## 2012-10-02 ENCOUNTER — Inpatient Hospital Stay (HOSPITAL_COMMUNITY): Payer: Medicare Other

## 2012-10-02 ENCOUNTER — Inpatient Hospital Stay (HOSPITAL_COMMUNITY): Payer: Medicare Other | Admitting: Speech Pathology

## 2012-10-02 ENCOUNTER — Encounter (HOSPITAL_COMMUNITY): Payer: Medicare Other

## 2012-10-02 ENCOUNTER — Encounter (HOSPITAL_COMMUNITY): Payer: Medicare Other | Admitting: Occupational Therapy

## 2012-10-02 DIAGNOSIS — S065X9A Traumatic subdural hemorrhage with loss of consciousness of unspecified duration, initial encounter: Secondary | ICD-10-CM

## 2012-10-02 DIAGNOSIS — I6200 Nontraumatic subdural hemorrhage, unspecified: Secondary | ICD-10-CM

## 2012-10-02 LAB — GLUCOSE, CAPILLARY
Glucose-Capillary: 139 mg/dL — ABNORMAL HIGH (ref 70–99)
Glucose-Capillary: 131 mg/dL — ABNORMAL HIGH (ref 70–99)
Glucose-Capillary: 84 mg/dL (ref 70–99)

## 2012-10-02 MED ORDER — METFORMIN HCL 500 MG PO TABS
250.0000 mg | ORAL_TABLET | Freq: Two times a day (BID) | ORAL | Status: DC
  Administered 2012-10-02 – 2012-10-03 (×3): 250 mg via ORAL
  Filled 2012-10-02 (×6): qty 1

## 2012-10-02 NOTE — Progress Notes (Signed)
[name], male   DOB: Dec 18, 1933, 79 y.o.   MRN: [id number] Subjective/Complaints: No complaints. Dull headache. Awake, knows he's in the hospital.  Review of Systems  Neurological: Positive for headaches.  All other systems reviewed and are negative.    Objective: Vital Signs: Blood pressure 146/86, pulse 58, temperature 97.5 F (36.4 C), temperature source Oral, resp. rate 17, height 6' (1.829 m), weight 81.4 kg (179 lb 7.3 oz), SpO2 96.00%. No results found. Results for orders placed during the hospital encounter of 09/30/12 (from the past 72 hour(s))  GLUCOSE, CAPILLARY     Status: Abnormal   Collection Time    09/30/12  8:58 PM      Result Value Range   Glucose-Capillary 205 (*) 70 - 99 mg/dL   Comment 1 Notify RN    GLUCOSE, CAPILLARY     Status: Abnormal   Collection Time    10/01/12  4:55 AM      Result Value Range   Glucose-Capillary 105 (*) 70 - 99 mg/dL   Comment 1 Repeat Test    CBC WITH DIFFERENTIAL     Status: Abnormal   Collection Time    10/01/12  6:10 AM      Result Value Range   WBC 9.9  4.0 - 10.5 K/uL   RBC 4.81  4.22 - 5.81 MIL/uL   Hemoglobin 13.1  13.0 - 17.0 g/dL   HCT 82.9 (*) 56.2 - 13.0 %   MCV 80.7  78.0 - 100.0 fL   MCH 27.2  26.0 - 34.0 pg   MCHC 33.8  30.0 - 36.0 g/dL   RDW 86.5  78.4 - 69.6 %   Platelets 299  150 - 400 K/uL   Neutrophils Relative 64  43 - 77 %   Neutro Abs 6.3  1.7 - 7.7 K/uL   Lymphocytes Relative 26  12 - 46 %   Lymphs Abs 2.6  0.7 - 4.0 K/uL   Monocytes Relative 9  3 - 12 %   Monocytes Absolute 0.8  0.1 - 1.0 K/uL   Eosinophils Relative 1  0 - 5 %   Eosinophils Absolute 0.1  0.0 - 0.7 K/uL   Basophils Relative 0  0 - 1 %   Basophils Absolute 0.0  0.0 - 0.1 K/uL  COMPREHENSIVE METABOLIC PANEL     Status: Abnormal   Collection Time    10/01/12  6:10 AM      Result Value Range   Sodium 137  135 - 145 mEq/L   Potassium 4.5  3.5 - 5.1 mEq/L   Chloride 102  96 - 112 mEq/L   CO2 26  19 - 32 mEq/L    Glucose, Bld 141 (*) 70 - 99 mg/dL   BUN 12  6 - 23 mg/dL   Creatinine, Ser 2.95  0.50 - 1.35 mg/dL   Calcium 9.1  8.4 - 28.4 mg/dL   Total Protein 6.5  6.0 - 8.3 g/dL   Albumin 2.7 (*) 3.5 - 5.2 g/dL   AST 17  0 - 37 U/L   ALT 20  0 - 53 U/L   Alkaline Phosphatase 161 (*) 39 - 117 U/L   Total Bilirubin 0.2 (*) 0.3 - 1.2 mg/dL   GFR calc non Af Amer >90  >90 mL/min   GFR calc Af Amer >90  >90 mL/min   Comment:            The eGFR has been calculated  using the CKD EPI equation.     This calculation has not been     validated in all clinical     situations.     eGFR's persistently     <90 mL/min signify     possible Chronic Kidney Disease.  GLUCOSE, CAPILLARY     Status: Abnormal   Collection Time    10/01/12  7:24 AM      Result Value Range   Glucose-Capillary 146 (*) 70 - 99 mg/dL   Comment 1 Notify RN    GLUCOSE, CAPILLARY     Status: Abnormal   Collection Time    10/01/12 11:59 AM      Result Value Range   Glucose-Capillary 178 (*) 70 - 99 mg/dL   Comment 1 Notify RN    GLUCOSE, CAPILLARY     Status: Abnormal   Collection Time    10/01/12  5:01 PM      Result Value Range   Glucose-Capillary 127 (*) 70 - 99 mg/dL   Comment 1 Notify RN    GLUCOSE, CAPILLARY     Status: Abnormal   Collection Time    10/01/12  8:52 PM      Result Value Range   Glucose-Capillary 224 (*) 70 - 99 mg/dL   Comment 1 Notify RN       HEENT: normal and scalp incision without hematoma Cardio: RRR and no murmurs Resp: CTA B/L and unlabored GI: BS positive and non tender Extremity:  Pulses positive and No Edema Skin:   Wound C/D/I Neuro: Lethargic, Cranial Nerve II-XII normal, Abnormal Sensory unable to assess due to reduced attn and Abnormal FMC Ataxic/ dec FMC Musc/Skel:  Normal GEN: NAD 4/5 strength in bilateral deltoid, biceps, triceps, grip, hip flexor, knee extensors, ankle dorsiflexor plantar flexor  Sensation unable to assess secondary to decreased attention     Assessment/Plan: 1. Functional deficits secondary to R SDH which require 3+ hours per day of interdisciplinary therapy in a comprehensive inpatient rehab setting. Physiatrist is providing close team supervision and 24 hour management of active medical problems listed below. Physiatrist and rehab team continue to assess barriers to discharge/monitor patient progress toward functional and medical goals. FIM: FIM - Bathing Bathing Steps Patient Completed: Right Arm;Left Arm;Abdomen;Front perineal area Bathing: 2: Max-Patient completes 3-4 15f 10 parts or 25-49%  FIM - Upper Body Dressing/Undressing Upper body dressing/undressing steps patient completed: Thread/unthread right sleeve of pullover shirt/dresss;Thread/unthread left sleeve of pullover shirt/dress;Put head through opening of pull over shirt/dress;Pull shirt over trunk Upper body dressing/undressing: 5: Set-up assist to: Obtain clothing/put away FIM - Lower Body Dressing/Undressing Lower body dressing/undressing steps patient completed: Thread/unthread right pants leg;Thread/unthread left pants leg;Pull pants up/down;Don/Doff right sock;Don/Doff left sock Lower body dressing/undressing: 4: Min-Patient completed 75 plus % of tasks        FIM - Banker Devices: Arm rests Bed/Chair Transfer: 5: Sit > Supine: Supervision (verbal cues/safety issues);4: Chair or W/C > Bed: Min A (steadying Pt. > 75%);4: Bed > Chair or W/C: Min A (steadying Pt. > 75%)  FIM - Locomotion: Wheelchair Locomotion: Wheelchair: 1: Total Assistance/staff pushes wheelchair (Pt<25%) FIM - Locomotion: Ambulation Locomotion: Ambulation Assistive Devices: Cane - Straight (handheld assist) Ambulation/Gait Assistance: 3: Mod assist Locomotion: Ambulation: 2: Travels 50 - 149 ft with moderate assistance (Pt: 50 - 74%)  Comprehension Comprehension Mode: Auditory Comprehension: 4-Understands basic 75 - 89% of the  time/requires cueing 10 - 24% of the time  Expression Expression Mode: Verbal  Expression: 4-Expresses basic 75 - 89% of the time/requires cueing 10 - 24% of the time. Needs helper to occlude trach/needs to repeat words.  Social Interaction Social Interaction: 3-Interacts appropriately 50 - 74% of the time - May be physically or verbally inappropriate.  Problem Solving Problem Solving: 3-Solves basic 50 - 74% of the time/requires cueing 25 - 49% of the time  Memory Memory: 2-Recognizes or recalls 25 - 49% of the time/requires cueing 51 - 75% of the time Medical Problem List and Plan:  1. Right subdural hematoma. Status post craniotomy evacuation of hematoma 09/19/2012 and redo craniotomy 09/22/2012 for recurrent subdural hematoma  2. DVT Prophylaxis/Anticoagulation: SCDs. Monitor for any signs of DVT  3. Mood: Xanax 0.25 mg twice a day. Check sleep chart  4. Dysphagia. Dysphagia 1 thin liquid diet. Monitor for signs of aspiration. Followup speech therapy  5. Neuropsych: This patient is not capable of making decisions on his/her own behalf.  6. Seizure prophylaxis. Keppra 500 mg twice a day  7. Hypertension. Tenormin 12.5 mg daily. Monitor with increased activity  8. Hypothyroidism. Synthroid  9. Non-insulin-dependent diabetes mellitus. Latest documented hemoglobin A1c of 6.3. Check blood sugars a.c. and at bedtime. Patient on Glucophage 500 mg twice a day prior to admission --begin at 250mg  to start 10. GERD. Protonix  11. Hyperlipidemia. Zocor     LOS (Days) 2 A FACE TO FACE EVALUATION WAS PERFORMED  Garret Teale T 10/02/2012, 8:28 AM

## 2012-10-02 NOTE — Progress Notes (Signed)
Speech Language Pathology Daily Session Note  Patient Details  Name: Gerald Leach MRN: [id number] Date of Birth: 10-24-1933  Today's Date: 10/02/2012 Time: 4098-1191 Time Calculation (min): 45 min  Short Term Goals: Week 1: SLP Short Term Goal 1 (Week 1): Pt will demonstrate sustained attention to task for 30 minutes with supervision verbal cues for redirection  SLP Short Term Goal 2 (Week 1): Pt will utilize external memory aids to increase recall of new, daily information with Min A verbal and question cues.  SLP Short Term Goal 3 (Week 1): Pt will identify 2 cognitive deficits with Min A semantic and question cues.  SLP Short Term Goal 4 (Week 1): Pt will demonstrate functional problem solving for basic and familiar tasks with Min A verbal cues.  SLP Short Term Goal 5 (Week 1): Pt will utilize swallowing compensatory strategies with supervision verbal cues to minimize overt s/s of aspiration   Skilled Therapeutic Interventions: Treatment focus on cognitive and dysphagia goals. Pt consumed current diet of Dys. 1 textures with thin liquids and required Max A question and semantic cues to recall swallowing compensatory strategies and Mod A question cues to utilize the strategies throughout the meal.  Pt required Min verbal cues to alternate attention between functional conversation and self-feeding. Pt also demonstrated decreased speech intelligibility and required Min verbal cues for a slow rate and increased vocal intensity at the phrase level.    FIM:  Comprehension Comprehension Mode: Auditory Comprehension: 4-Understands basic 75 - 89% of the time/requires cueing 10 - 24% of the time Expression Expression Mode: Verbal Expression: 4-Expresses basic 75 - 89% of the time/requires cueing 10 - 24% of the time. Needs helper to occlude trach/needs to repeat words. Social Interaction Social Interaction: 4-Interacts appropriately 75 - 89% of the time - Needs redirection for appropriate  language or to initiate interaction. Problem Solving Problem Solving: 3-Solves basic 50 - 74% of the time/requires cueing 25 - 49% of the time Memory Memory: 3-Recognizes or recalls 50 - 74% of the time/requires cueing 25 - 49% of the time FIM - Eating Eating Activity: 5: Supervision/cues;5: Set-up assist for open containers  Pain Pain Assessment Pain Assessment: No/denies pain  Therapy/Group: Individual Therapy  Glendell Fouse 10/02/2012, 3:54 PM

## 2012-10-02 NOTE — Progress Notes (Signed)
Occupational Therapy Note  Patient Details  Name: Gerald Leach MRN: [id number] Date of Birth: [birth year] Today's Date: 10/02/2012  Time: 1130-1145 (cotx with Speech Therapy-total time 1130-1200) Pt denies pain Group Therapy  Pt participated in self feeding group with focus on attention to left/LUE and adhering to swallowing strategies.  Pt incorporated LUE with all tasks but required mod verbal cues to wipe left side of mouth and perform tongue sweep.   [name] [facility] 10/02/2012, 3:19 PM

## 2012-10-02 NOTE — Progress Notes (Signed)
Social Work  Social Work Assessment and Plan  Patient Details  Name: Gerald Leach MRN: [id number] Date of Birth: 12/07/1933  Today's Date: 10/02/2012  Problem List:  Patient Active Problem List  Diagnosis  . CARCINOMA, SKIN, SQUAMOUS CELL, FACE  . COLONIC POLYPS  . HYPOTHYROIDISM  . HYPERLIPIDEMIA  . HYPERTENSION  . GERD  . SOLAR KERATOSIS  . CARCINOMA, SKIN, SQUAMOUS CELL, FACE  . Diabetes mellitus type II, uncontrolled  . Facial laceration  . Contusion, chest wall  . Subdural hematoma  . Respiratory failure, post-operative  . S/P craniotomy  . Dysphagia  . Nontraumatic subdural hemorrhage   Past Medical History:  Past Medical History  Diagnosis Date  . Hypertension   . Colonic polyp 2003  . GERD (gastroesophageal reflux disease)   . ED (erectile dysfunction)   . Hyperlipidemia   . Hypothyroidism   . Brain aneurysm    Past Surgical History:  Past Surgical History  Procedure Laterality Date  . Brain aneurysm surgery      at [facility]  . Carotid artery aneurysm    . Colonoscopy      polyps  . Right hernia    . Craniotomy Right 09/19/2012    Procedure: CRANIOTOMY HEMATOMA EVACUATION SUBDURAL;  Surgeon: Clydene Fake, MD;  Location: [facility];  Service: Neurosurgery;  Laterality: Right;  . Craniotomy Right 09/22/2012    Procedure: CRANIOTOMY HEMATOMA EVACUATION SUBDURAL;  Surgeon: Clydene Fake, MD;  Location: [facility];  Service: Neurosurgery;  Laterality: Right;   Social History:  reports that he has never smoked. He has never used smokeless tobacco. He reports that  drinks alcohol. He reports that he does not use illicit drugs.  Family / Support Systems Marital Status: Married How Long?: 53 yrs. Patient Roles: Parent;Other (Comment);Spouse (employee) Spouse/Significant Other: wife, Gerald Leach @ [phone] or [phone] Children: son, Gerald Leach @ ([phone] and daughter, Gerald Leach @ (C) [phone] - both are local plus one other daughter,  Gerald Leach, living in [place], [place] Anticipated Caregiver: wife , son , and two daughters Ability/Limitations of Caregiver: wife uses cane. transport chair for long distances. recent back surgery Caregiver Availability: 24/7 Family Dynamics: As noted, at time of admission, AC had been told that family could provide 24/7 care, however, at the initial discussion I had with wife, she has begun expressing hesitency to truly providing 24/7 care.     Social History Preferred language: English Religion: Non-Denominational Cultural Background: NA Education: college Read: Yes Write: Yes Employment Status: Employed Name of Employer: Education officer, environmental, [employer]. [employer] of Employment:  ("years") Return to Work Plans: Pt contemplates if he should plan to retire  -TBD Fish farm manager Issues: None Guardian/Conservator: None   Abuse/Neglect Physical Abuse: Denies Verbal Abuse: Denies Sexual Abuse: Denies Exploitation of patient/patient's resources: Denies Self-Neglect: Denies  Emotional Status Pt's affect, behavior adn adjustment status: Pt sitting in w/c with head down on chest and never making eye contact, however, answering questions appropriately.  Reports he is "sleepy... because I didn't get any good sleep last night".  Pt able to provide general demographic information easily.  Attempts to raise head from time to time.  Denies any significant emotional distress.  Denies any s/s of depression or anxiety.  Will monitor emotional status as his attention and overall cognition improved Recent Psychosocial Issues: None Pyschiatric History: None Substance Abuse History: None  Patient / Family Perceptions, Expectations & Goals Pt/Family understanding of illness & functional limitations:  Pt able to report,  "I had a head injury.  They went in right here (pointing to incision line) and got the blood out".  Wife with very basic understanding of hemorrhage, however, limited appreciation of  current functional deficits and likely need for superviison beyond CIR.  Will benefit from further education from team. 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  Wife primary caretaker of home. Anticipated changes in roles/activities/participation: Wife will need to assume a more "hands on" caregiver role for pt and be responsible for supplying 24/7 supervision.  Pt's return to any of his prior work is highly unlikely -  Pt/family expectations/goals: Per pt, "I want to get home".  Wife notes, "I need him to be a little better than what you are saying"  [employer]: None Premorbid Home Care/DME Agencies: None Transportation available at discharge: yes Resource referrals recommended: Neuropsychology;Support group (specify)  Discharge Planning Living Arrangements: Spouse/significant other Support Systems: Spouse/significant other;Children Type of Residence: Private residence Insurance Resources: [employer] Financial Resources: Social Security;Employment Financial Screen Referred: No Living Expenses: Own Money Management: Patient Do you have any problems obtaining your medications?: No Home Management: wife usually Patient/Family Preliminary Plans: Pt plans to return home with his wife.  Wife uncertain about her abilities to truly provide 24/7 supervision. Barriers to Discharge: Family Support Social Work Anticipated Follow Up Needs: HH/OP;Support Group Expected length of stay: ELOS 2 weeks  Clinical Impression Pleasant gentleman who appears very lethargic at start of interview, however, able to provide good, basic personal information. No emotional distress noted.  Wife at home, however, sounds hesitant to provide commitment of 24/7 supervision.  This may become an issue for d/c planning.  Will continue to follow for support, education and d/c planning.  Gerald Leach 10/02/2012, 3:36  PM

## 2012-10-02 NOTE — Progress Notes (Signed)
Occupational Therapy Note  Patient Details  Name: ODEN LINDAMAN MRN: [id number] Date of Birth: [birth year] Today's Date: 10/02/2012    Time: 4098-1191 Pt denies pain Individual Therapy  Pt engaged in table tasks with focus on scanning for matching pictures on cards.  Task involved selecting 3 matching objects on field of 9 cards with 7 pictures on each card.  Pt required extra time and verbal cues for sequencing/initiating locating the 3 pictures.  Pt transitioned to assembling structure with PVC piping that replicates picture of structure.  Pt required extra time to complete task.  Pt scanned to left throughout tasks but required min verbal cues to wipe mouth and swallow during session.  Pt was able to recall reason for being in hospital and how the hemorrhage has affected him functionally.   [name] [facility] 10/02/2012, 10:01 AM

## 2012-10-02 NOTE — Progress Notes (Signed)
Physical Therapy Note  Patient Details  Name: Gerald Leach MRN: [id number] Date of Birth: 01-09-1934 Today's Date: 10/02/2012  Time: 1300-1357 57 minutes  No c/o pain.  Pt asleep on PT arrival, required encouragement to participate in therapy.  Standing balance training without UE support with focus on reaching and bending with bowling task.  Pt min A for balance, min cuing for L attention and awareness.  Attempt to have pt check email.  Pt able to type address with mod-max cuing for problem solving, L scanning and organization of thoughts.  Gait training with RW with focus on increasing step length and cadence.  Pt requires mod manual facilitation and max verbal cues.  Only able to increase speed for short distances before inattention and fatigue.  Individual therapy   Gerald Leach 10/02/2012, 1:56 PM

## 2012-10-02 NOTE — Progress Notes (Signed)
Speech Language Pathology Daily Session Note  Patient Details  Name: Gerald Leach DELIA MRN: [id number] Date of Birth: 01-29-1934  Today's Date: 10/02/2012 Time: 1145-1200 Time Calculation (min): 15 min  Short Term Goals: Week 1: SLP Short Term Goal 1 (Week 1): Pt will demonstrate sustained attention to task for 30 minutes with supervision verbal cues for redirection  SLP Short Term Goal 2 (Week 1): Pt will utilize external memory aids to increase recall of new, daily information with Min A verbal and question cues.  SLP Short Term Goal 3 (Week 1): Pt will identify 2 cognitive deficits with Min A semantic and question cues.  SLP Short Term Goal 4 (Week 1): Pt will demonstrate functional problem solving for basic and familiar tasks with Min A verbal cues.  SLP Short Term Goal 5 (Week 1): Pt will utilize swallowing compensatory strategies with supervision verbal cues to minimize overt s/s of aspiration   Skilled Therapeutic Interventions: Co-treatment, group session with OT to address safety with swallowing and self-feeding.  SLP facilitated session with supervision verbal cues to utilize slow pace of self-feeding and mod assist cues to monitor left anterior loss of boluses.  Patient consumed Dys.1 textures and thin liquids with no overt s/s of aspiration.  Continue with current plan of care.    FIM:  FIM - Eating Eating Activity: 5: Supervision/cues  Pain Pain Assessment Pain Assessment: No/denies pain  Therapy/Group: Individual Therapy  Charlane Ferretti., CCC-SLP [phone]  Janella Rogala 10/02/2012, 1:28 PM

## 2012-10-02 NOTE — Progress Notes (Signed)
Occupational Therapy Session Note  Patient Details  Name: Gerald Leach MRN: [id number] Date of Birth: 01-20-1934  Today's Date: 10/02/2012 Time: 0730-0830 Time Calculation (min): 60 min  Short Term Goals: Week 1:  OT Short Term Goal 1 (Week 1): Pt will demonstration orientation x4 with environmental cues OT Short Term Goal 2 (Week 1): Pt will don LB clothing with supervision OT Short Term Goal 3 (Week 1): Pt will selective attention in a moderate distracting environment with min cuing OT Short Term Goal 4 (Week 1): shower stall transfer with min A with appropriate DME  Skilled Therapeutic Interventions/Progress Updates:    1:1 self care retraining at shower level with focus on functional ambulation with RW around room, task organization, sit to stand, standing balance, left attention to body and environment, functional use of left LE with visual attention with mod to max cues. Pt required mod cuing throughout session for initiation and completion of tasks. Pt still difficulty with threading dressing due to left inattention. Pt with increased awareness of "cluminess with left hand" but requires max cuing to visually attend to task.  Therapy Documentation Precautions:  Precautions Precautions: Fall Restrictions Weight Bearing Restrictions: No Pain: No c/o pain See FIM for current functional status  Therapy/Group: Individual Therapy  Roney Mans [facility] 10/02/2012, 8:38 AM

## 2012-10-02 NOTE — Progress Notes (Signed)
Physical Therapy Note  Patient Details  Name: Gerald Leach MRN: [id number] Date of Birth: 16-Nov-1933 Today's Date: 10/02/2012  Time: 1000-1056 56 minutes  No c/o pain.  Gait training without AD with min-mod A.  Pt with wide BOS, shuffling gait with very little foot clearance B, step to pattern with L LE.  Manual facilitation and verbal cues to increase cadence, pt resistant, requires mod A.  Pt tends to furniture walk, unable to be redirected to decrease this behavior stating, "this is how I do it at home".  Gait with RW with pt able to be min guad - min A, no furniture walking or LOB.  Pt continues with wide BOS, step to pattern and shuffling steps.  Standing balance with sequencing and organization task with min cuing to attend to L side and L UE.  Pt internally distracted throughout session by needing "new pants".  Pt performed household gait and reaching tasks to search room for pants.  Pt able to perform balance tasks with min A.  Pt required mod cuing for redirection and attention to task throughout.  Individual therapy   Trek Kimball 10/02/2012, 10:55 AM

## 2012-10-03 ENCOUNTER — Inpatient Hospital Stay (HOSPITAL_COMMUNITY): Payer: Medicare Other | Admitting: Occupational Therapy

## 2012-10-03 ENCOUNTER — Inpatient Hospital Stay (HOSPITAL_COMMUNITY): Payer: Medicare Other

## 2012-10-03 ENCOUNTER — Inpatient Hospital Stay (HOSPITAL_COMMUNITY): Payer: Medicare Other | Admitting: Physical Therapy

## 2012-10-03 ENCOUNTER — Inpatient Hospital Stay (HOSPITAL_COMMUNITY): Payer: Medicare Other | Admitting: Speech Pathology

## 2012-10-03 LAB — GLUCOSE, CAPILLARY
Glucose-Capillary: 129 mg/dL — ABNORMAL HIGH (ref 70–99)
Glucose-Capillary: 305 mg/dL — ABNORMAL HIGH (ref 70–99)
Glucose-Capillary: 130 mg/dL — ABNORMAL HIGH (ref 70–99)

## 2012-10-03 NOTE — Progress Notes (Signed)
Occupational Therapy Session Note  Patient Details  Name: Gerald Leach MRN: [id number] Date of Birth: [birth year]  Today's Date: 10/03/2012 Time: 3474-2595 Time Calculation (min): 20 min  Short Term Goals: Week 1:  OT Short Term Goal 1 (Week 1): Pt will demonstration orientation x4 with environmental cues OT Short Term Goal 2 (Week 1): Pt will don LB clothing with supervision OT Short Term Goal 3 (Week 1): Pt will selective attention in a moderate distracting environment with min cuing OT Short Term Goal 4 (Week 1): shower stall transfer with min A with appropriate DME  Skilled Therapeutic Interventions/Progress Updates:    1:1 pt in bed when arrived and reporting he doesn't feel well. Vitals taken BP 85/67 O2 97% and HR 87. Focus on problem solving how to contact RN to receive pain meds for head and getting to EOB to take meds. Pt declined OOB activity this pm due to HA and 'not feeling well. RN took BP 104/67. Left pt in bed.  Therapy Documentation Precautions:  Precautions Precautions: Fall Restrictions Weight Bearing Restrictions: No General: General Amount of Missed OT Time (min): 10 Minutes due to not feeling well. Pain: Pain Assessment Pain Assessment: No/denies pain Pain Score:   6 Pain Location: Head Pain Orientation: Right Pain Radiating Towards: occipital Pain Descriptors: Headache Patients Stated Pain Goal: 4 Pain Intervention(s): Medication (See eMAR)  See FIM for current functional status  Therapy/Group: Individual Therapy  Roney Mans [facility] 10/03/2012, 1:38 PM

## 2012-10-03 NOTE — Progress Notes (Signed)
Physical Therapy Note  Patient Details  Name: Gerald Leach MRN: [id number] Date of Birth: 08/13/1933 Today's Date: 10/03/2012  Time: 900-956 56 minutes  No c/o pain.  Pt performed seated and standing balance with dressing and grooming task with min A for standing balance, supervision seated balance.  Pt requires cues to attend to L UE and for L UE coordination with dressing.  Gait training with RW with close supervision today.  Improved posture, continues with shuffling steps and decreased cadence.  Work on attention and L UE coordination with self feeding task.  Pt required mod cuing for attention to L UE and mod-max facilitation for awareness of L side of mouth during eating task.  Pt continues with decreased awareness of deficits.  Individual therapy   Gerald Leach 10/03/2012, 9:56 AM

## 2012-10-03 NOTE — Progress Notes (Signed)
SLP Cancellation Note  Patient Details Name: Gerald Leach MRN: [id number] DOB: [birth year]   Cancelled treatment:       Pt missed 60 minutes of skilled SLP intervention due to severe headache and refusal to participate. RN made aware and verbalized pt was pre-medicated.    [name] 10/03/2012, 2:11 PM

## 2012-10-03 NOTE — Progress Notes (Signed)
Speech Language Pathology Daily Session Note  Patient Details  Name: Gerald Leach MRN: [id number] Date of Birth: 20-Apr-1934  Today's Date: 10/03/2012 Time: 1200-1215 Time Calculation (min): 15 min  Short Term Goals: Week 1: SLP Short Term Goal 1 (Week 1): Pt will demonstrate sustained attention to task for 30 minutes with supervision verbal cues for redirection  SLP Short Term Goal 2 (Week 1): Pt will utilize external memory aids to increase recall of new, daily information with Min A verbal and question cues.  SLP Short Term Goal 3 (Week 1): Pt will identify 2 cognitive deficits with Min A semantic and question cues.  SLP Short Term Goal 4 (Week 1): Pt will demonstrate functional problem solving for basic and familiar tasks with Min A verbal cues.  SLP Short Term Goal 5 (Week 1): Pt will utilize swallowing compensatory strategies with supervision verbal cues to minimize overt s/s of aspiration   Skilled Therapeutic Interventions: Co-treatment, group session with OT to address safety with swallowing and self-feeding.  SLP facilitated session with supervision verbal cues to utilize slow pace of self-feeding and min assist cues to monitor left anterior loss of boluses.  Of note paitent was more independnet today due to mirror increasing ability to self-monitor and correct.  Patient consumed Dys.1 textures and thin liquids with no overt s/s of aspiration.  Continue with current plan of care.     Pain Pain Assessment Pain Assessment: No/denies pain  Therapy/Group: Group Therapy  Gerald Leach., CCC-SLP [phone]  Gerald Leach 10/03/2012, 1:34 PM

## 2012-10-03 NOTE — Progress Notes (Signed)
OccupationalTherapy Note  Patient Details  Name: Gerald Leach MRN: [id number] Date of Birth: 03-04-1934 Today's Date: 10/03/2012  [employer] [facility] with SLP 1130-12:00  No c/o pain Focus on self feeding of Dys I diet with focus on functional use of left UE with setup of tray, visual scanning to left, bilateral coordination with UEs, simple problem solving, management of anterior oral spillage of food and drink with min cuing with visual aid of mirror to decrease cuing.   Roney [facility] 10/03/2012, 1:41 PM

## 2012-10-03 NOTE — Progress Notes (Signed)
[name], male   DOB: [birth year], 79 y.o.   MRN: [id number] Subjective/Complaints: Awake, up with therapy already. No new complaints.   Review of Systems  Neurological: Positive for headaches.  All other systems reviewed and are negative.    Objective: Vital Signs: Blood pressure 143/85, pulse 64, temperature 97.3 F (36.3 C), temperature source Oral, resp. rate 17, height 6' (1.829 m), weight 81.4 kg (179 lb 7.3 oz), SpO2 98.00%. No results found. Results for orders placed during the hospital encounter of 09/30/12 (from the past 72 hour(s))  GLUCOSE, CAPILLARY     Status: Abnormal   Collection Time    09/30/12  5:50 PM      Result Value Range   Glucose-Capillary 139 (*) 70 - 99 mg/dL   Comment 1 Notify RN    GLUCOSE, CAPILLARY     Status: Abnormal   Collection Time    09/30/12  8:58 PM      Result Value Range   Glucose-Capillary 205 (*) 70 - 99 mg/dL   Comment 1 Notify RN    GLUCOSE, CAPILLARY     Status: Abnormal   Collection Time    10/01/12  4:55 AM      Result Value Range   Glucose-Capillary 105 (*) 70 - 99 mg/dL   Comment 1 Repeat Test    CBC WITH DIFFERENTIAL     Status: Abnormal   Collection Time    10/01/12  6:10 AM      Result Value Range   WBC 9.9  4.0 - 10.5 K/uL   RBC 4.81  4.22 - 5.81 MIL/uL   Hemoglobin 13.1  13.0 - 17.0 g/dL   HCT 40.9 (*) 81.1 - 91.4 %   MCV 80.7  78.0 - 100.0 fL   MCH 27.2  26.0 - 34.0 pg   MCHC 33.8  30.0 - 36.0 g/dL   RDW 78.2  95.6 - 21.3 %   Platelets 299  150 - 400 K/uL   Neutrophils Relative 64  43 - 77 %   Neutro Abs 6.3  1.7 - 7.7 K/uL   Lymphocytes Relative 26  12 - 46 %   Lymphs Abs 2.6  0.7 - 4.0 K/uL   Monocytes Relative 9  3 - 12 %   Monocytes Absolute 0.8  0.1 - 1.0 K/uL   Eosinophils Relative 1  0 - 5 %   Eosinophils Absolute 0.1  0.0 - 0.7 K/uL   Basophils Relative 0  0 - 1 %   Basophils Absolute 0.0  0.0 - 0.1 K/uL  COMPREHENSIVE METABOLIC PANEL     Status: Abnormal   Collection Time   10/01/12  6:10 AM      Result Value Range   Sodium 137  135 - 145 mEq/L   Potassium 4.5  3.5 - 5.1 mEq/L   Chloride 102  96 - 112 mEq/L   CO2 26  19 - 32 mEq/L   Glucose, Bld 141 (*) 70 - 99 mg/dL   BUN 12  6 - 23 mg/dL   Creatinine, Ser 0.86  0.50 - 1.35 mg/dL   Calcium 9.1  8.4 - 57.8 mg/dL   Total Protein 6.5  6.0 - 8.3 g/dL   Albumin 2.7 (*) 3.5 - 5.2 g/dL   AST 17  0 - 37 U/L   ALT 20  0 - 53 U/L   Alkaline Phosphatase 161 (*) 39 - 117 U/L   Total Bilirubin 0.2 (*) 0.3 - 1.2 mg/dL  GFR calc non Af Amer >90  >90 mL/min   GFR calc Af Amer >90  >90 mL/min   Comment:            The eGFR has been calculated     using the CKD EPI equation.     This calculation has not been     validated in all clinical     situations.     eGFR's persistently     <90 mL/min signify     possible Chronic Kidney Disease.  GLUCOSE, CAPILLARY     Status: Abnormal   Collection Time    10/01/12  7:24 AM      Result Value Range   Glucose-Capillary 146 (*) 70 - 99 mg/dL   Comment 1 Notify RN    GLUCOSE, CAPILLARY     Status: Abnormal   Collection Time    10/01/12 11:59 AM      Result Value Range   Glucose-Capillary 178 (*) 70 - 99 mg/dL   Comment 1 Notify RN    GLUCOSE, CAPILLARY     Status: Abnormal   Collection Time    10/01/12  5:01 PM      Result Value Range   Glucose-Capillary 127 (*) 70 - 99 mg/dL   Comment 1 Notify RN    GLUCOSE, CAPILLARY     Status: Abnormal   Collection Time    10/01/12  8:52 PM      Result Value Range   Glucose-Capillary 224 (*) 70 - 99 mg/dL   Comment 1 Notify RN    GLUCOSE, CAPILLARY     Status: Abnormal   Collection Time    10/02/12  7:27 AM      Result Value Range   Glucose-Capillary 131 (*) 70 - 99 mg/dL   Comment 1 Notify RN    GLUCOSE, CAPILLARY     Status: Abnormal   Collection Time    10/02/12 11:34 AM      Result Value Range   Glucose-Capillary 182 (*) 70 - 99 mg/dL  GLUCOSE, CAPILLARY     Status: Abnormal   Collection Time    10/02/12  4:55  PM      Result Value Range   Glucose-Capillary 142 (*) 70 - 99 mg/dL   Comment 1 Notify RN    GLUCOSE, CAPILLARY     Status: None   Collection Time    10/02/12  9:04 PM      Result Value Range   Glucose-Capillary 84  70 - 99 mg/dL   Comment 1 Notify RN    GLUCOSE, CAPILLARY     Status: Abnormal   Collection Time    10/03/12  7:23 AM      Result Value Range   Glucose-Capillary 129 (*) 70 - 99 mg/dL     HEENT: normal and scalp incision without hematoma Cardio: RRR and no murmurs Resp: CTA B/L and unlabored GI: BS positive and non tender Extremity:  Pulses positive and No Edema Skin:   Wound C/D/I Neuro: Lethargic, Cranial Nerve II-XII normal,  FMC Ataxic/ dec FMC Musc/Skel:  Normal GEN: NAD 4/5 strength in bilateral deltoid, biceps, triceps, grip, hip flexor, knee extensors, ankle dorsiflexor plantar flexor   Demonstrates basic insight and awareness. Follows all commands. Attention does wane easily though.    Assessment/Plan: 1. Functional deficits secondary to R SDH which require 3+ hours per day of interdisciplinary therapy in a comprehensive inpatient rehab setting. Physiatrist is providing close team supervision and 24 hour management  of active medical problems listed below. Physiatrist and rehab team continue to assess barriers to discharge/monitor patient progress toward functional and medical goals. FIM: FIM - Bathing Bathing Steps Patient Completed: Right Arm;Left Arm;Abdomen;Front perineal area;Chest;Right upper leg;Left upper leg;Right lower leg (including foot);Left lower leg (including foot) Bathing: 4: Min-Patient completes 8-9 33f 10 parts or 75+ percent  FIM - Upper Body Dressing/Undressing Upper body dressing/undressing steps patient completed: Thread/unthread right sleeve of pullover shirt/dresss;Thread/unthread left sleeve of pullover shirt/dress;Put head through opening of pull over shirt/dress;Pull shirt over trunk Upper body dressing/undressing: 5: Set-up  assist to: Obtain clothing/put away FIM - Lower Body Dressing/Undressing Lower body dressing/undressing steps patient completed: Thread/unthread right pants leg;Thread/unthread left pants leg;Pull pants up/down;Don/Doff left sock Lower body dressing/undressing: 4: Min-Patient completed 75 plus % of tasks  FIM - Toileting Toileting steps completed by patient: Adjust clothing prior to toileting;Performs perineal hygiene;Adjust clothing after toileting Toileting Assistive Devices: Grab bar or rail for support Toileting: 4: Steadying assist  FIM - Diplomatic Services operational officer Devices: Grab bars;Walker Toilet Transfers: 4-To toilet/BSC: Min A (steadying Pt. > 75%);4-From toilet/BSC: Min A (steadying Pt. > 75%)  FIM - Bed/Chair Transfer Bed/Chair Transfer Assistive Devices: Arm rests Bed/Chair Transfer: 4: Supine > Sit: Min A (steadying Pt. > 75%/lift 1 leg);4: Sit > Supine: Min A (steadying pt. > 75%/lift 1 leg);4: Bed > Chair or W/C: Min A (steadying Pt. > 75%)  FIM - Locomotion: Wheelchair Locomotion: Wheelchair: 1: Travels less than 50 ft with minimal assistance (Pt.>75%) FIM - Locomotion: Ambulation Locomotion: Ambulation Assistive Devices: Cane - Straight (handheld assist) Ambulation/Gait Assistance: 3: Mod assist Locomotion: Ambulation: 4: Travels 150 ft or more with minimal assistance (Pt.>75%)  Comprehension Comprehension Mode: Auditory Comprehension: 4-Understands basic 75 - 89% of the time/requires cueing 10 - 24% of the time  Expression Expression Mode: Verbal Expression: 4-Expresses basic 75 - 89% of the time/requires cueing 10 - 24% of the time. Needs helper to occlude trach/needs to repeat words.  Social Interaction Social Interaction: 4-Interacts appropriately 75 - 89% of the time - Needs redirection for appropriate language or to initiate interaction.  Problem Solving Problem Solving: 3-Solves basic 50 - 74% of the time/requires cueing 25 - 49% of the  time  Memory Memory: 3-Recognizes or recalls 50 - 74% of the time/requires cueing 25 - 49% of the time Medical Problem List and Plan:  1. Right subdural hematoma. Status post craniotomy evacuation of hematoma 09/19/2012 and redo craniotomy 09/22/2012 for recurrent subdural hematoma  2. DVT Prophylaxis/Anticoagulation: SCDs. Monitor for any signs of DVT  3. Mood: Xanax 0.25 mg twice a day.    4. Dysphagia. Dysphagia 1 thin liquid diet. Monitor for signs of aspiration. Followup speech therapy   -intake good thus far 5. Neuropsych: This patient is not capable of making decisions on his/her own behalf.  6. Seizure prophylaxis. Keppra 500 mg twice a day  7. Hypertension. Tenormin 12.5 mg daily. Monitor with increased activity  8. Hypothyroidism. Synthroid  9. Non-insulin-dependent diabetes mellitus. Latest documented hemoglobin A1c of 6.3. Check blood sugars a.c. and at bedtime. Patient on Glucophage 500 mg twice a day prior to admission --250mg  bid initiated Thursday 10. GERD. Protonix  11. Hyperlipidemia. Zocor     LOS (Days) 3 A FACE TO FACE EVALUATION WAS PERFORMED  Durga Saldarriaga T 10/03/2012, 9:27 AM

## 2012-10-03 NOTE — Plan of Care (Signed)
Problem: RH BLADDER ELIMINATION Goal: RH STG MANAGE BLADDER WITH ASSISTANCE STG Manage Bladder With min Assistance  Outcome: Not Progressing Needs staff to change brief; doesn't call

## 2012-10-03 NOTE — Progress Notes (Signed)
Occupational Therapy Session Note  Patient Details  Name: Gerald Leach MRN: [id number] Date of Birth: 1933-12-19  Today's Date: 10/03/2012 Session 1 Time: 0820-0900 Time Calculation (min): 40 min  Short Term Goals: Week 1:  OT Short Term Goal 1 (Week 1): Pt will demonstration orientation x4 with environmental cues OT Short Term Goal 2 (Week 1): Pt will don LB clothing with supervision OT Short Term Goal 3 (Week 1): Pt will selective attention in a moderate distracting environment with min cuing OT Short Term Goal 4 (Week 1): shower stall transfer with min A with appropriate DME  Skilled Therapeutic Interventions/Progress Updates:    Pt required max encouragement to participate in therapy this morning.  Pt missed 20 mins skilled OT services secondary to unwillingness to get OOB.  Pt amb with RW to bathroom to use toilet before engaging in grooming tasks while standing at sink.  Pt performed LB dressing with sit<>stand from EOB.  Pt required assistance orienting pants and threading LLE into pants leg.  Focus on activity tolerance, participation, attention to left, BUE use for functional tasks, and safety awareness.  Therapy Documentation Precautions:  Precautions Precautions: Fall Restrictions Weight Bearing Restrictions: No General: General Amount of Missed OT Time (min): 20 Minutes   Pain: Pain Assessment Pain Assessment: No/denies pain  See FIM for current functional status  Therapy/Group: Individual Therapy  Session 2 Time: 1005-1030 Pt denies pain Individual Therapy  Pt engaged in dynamic standing tasks in gym including reaching for plastic horseshoes with LUE and tossing them at target.  Pt amb without AD to pick up objects from floor and return to container.  Pt required steady A for all tasks.  Pt was cooperative during session and participated in all planned tasks. Pt required min verbal cues for safety when sitting down on mat and in w/c.  Focus on activity  tolerance, safety awareness, participation, and dynamic standing balance.  [name] [facility] 10/03/2012, 9:09 AM

## 2012-10-04 ENCOUNTER — Inpatient Hospital Stay (HOSPITAL_COMMUNITY): Payer: Medicare Other | Admitting: Speech Pathology

## 2012-10-04 ENCOUNTER — Inpatient Hospital Stay (HOSPITAL_COMMUNITY): Payer: Medicare Other | Admitting: *Deleted

## 2012-10-04 ENCOUNTER — Inpatient Hospital Stay (HOSPITAL_COMMUNITY): Payer: Medicare Other

## 2012-10-04 ENCOUNTER — Inpatient Hospital Stay (HOSPITAL_COMMUNITY): Payer: Medicare Other | Admitting: Physical Therapy

## 2012-10-04 LAB — GLUCOSE, CAPILLARY
Glucose-Capillary: 127 mg/dL — ABNORMAL HIGH (ref 70–99)
Glucose-Capillary: 175 mg/dL — ABNORMAL HIGH (ref 70–99)

## 2012-10-04 MED ORDER — METFORMIN HCL 500 MG PO TABS
500.0000 mg | ORAL_TABLET | Freq: Two times a day (BID) | ORAL | Status: DC
  Administered 2012-10-04 – 2012-10-10 (×13): 500 mg via ORAL
  Filled 2012-10-04 (×15): qty 1

## 2012-10-04 NOTE — Progress Notes (Signed)
Speech Language Pathology Daily Session Note  Patient Details  Name: Gerald Leach MRN: [id number] Date of Birth: Jul 08, 1933  Today's Date: 10/04/2012 Time: 6213-0865 Time Calculation (min): 30 min  Short Term Goals: Week 1: SLP Short Term Goal 1 (Week 1): Pt will demonstrate sustained attention to task for 30 minutes with supervision verbal cues for redirection  SLP Short Term Goal 2 (Week 1): Pt will utilize external memory aids to increase recall of new, daily information with Min A verbal and question cues.  SLP Short Term Goal 3 (Week 1): Pt will identify 2 cognitive deficits with Min A semantic and question cues.  SLP Short Term Goal 4 (Week 1): Pt will demonstrate functional problem solving for basic and familiar tasks with Min A verbal cues.  SLP Short Term Goal 5 (Week 1): Pt will utilize swallowing compensatory strategies with supervision verbal cues to minimize overt s/s of aspiration   Skilled Therapeutic Interventions: Skilled treatment session addressed dysphagia gaols.  SLP facilitated session with trials of Dys.2 and Dys.3 textures.  Patient exhibited no overt s/s of aspiration with either textures; however, patietn required liquid wash to assist with transiting and reducing oral residue; as a result it is recommended that he be upgraded to Dys.2 textures and continue with thin liquids and full staff supervision to ensure small bites and sips and to self-monitor left anterior loss.   FIM:  Comprehension Comprehension Mode: Auditory Comprehension: 4-Understands basic 75 - 89% of the time/requires cueing 10 - 24% of the time Expression Expression Mode: Verbal Expression: 4-Expresses basic 75 - 89% of the time/requires cueing 10 - 24% of the time. Needs helper to occlude trach/needs to repeat words. Social Interaction Social Interaction: 4-Interacts appropriately 75 - 89% of the time - Needs redirection for appropriate language or to initiate interaction. Problem  Solving Problem Solving: 3-Solves basic 50 - 74% of the time/requires cueing 25 - 49% of the time Memory Memory: 3-Recognizes or recalls 50 - 74% of the time/requires cueing 25 - 49% of the time FIM - Eating Eating Activity: 5: Supervision/cues;5: Set-up assist for open containers  Pain Pain Assessment Pain Assessment: No/denies pain  Therapy/Group: Individual Therapy  Charlane Ferretti., CCC-SLP [phone]  Leelynd Maldonado 10/04/2012, 11:25 AM

## 2012-10-04 NOTE — Progress Notes (Signed)
Physical Therapy Session Note  Patient Details  Name: Gerald Leach MRN: [id number] Date of Birth: February 29, 1934  Today's Date: 10/04/2012 Time: 0943-1000 Time Calculation (min): 17 min  Short Term Goals: Week 1:  PT Short Term Goal 1 (Week 1): Patient will perform sit<>stand transfers and stand pivot transfers with LRAD and supervision. PT Short Term Goal 2 (Week 1): Patient will ambulate 100' with LRAD and min assist. PT Short Term Goal 3 (Week 1): Patient will negotiate 5 steps with one handrail and min assist.  Skilled Therapeutic Interventions/Progress Updates:    Patient received from RN standing at sink, washing hands. Patient missed first 13 minutes of session secondary to using the bathroom. This session focused on gait training in home environment, on carpet and tile surfaces, negotiating objects/thresholds in confined spaces, and rolling walker management. Patient requires min guard for all gait training 75'x2 and several small bouts of 15-25' in home environment. Patient requires min verbal cues for rolling walker management, especially with turning and stand pivots as patient tends to maintain BOS outside RW.  Patient returned to room and left seated in recliner with all needs within reach.  Therapy Documentation Precautions:  Precautions Precautions: Fall Restrictions Weight Bearing Restrictions: No General: Amount of Missed PT Time (min): 13 Minutes Missed Time Reason: Unavailable (comment) (in bathroom) Pain: Pain Assessment Pain Assessment: No/denies pain Pain Score: 0-No pain Locomotion : Ambulation Ambulation/Gait Assistance: 4: Min guard   See FIM for current functional status  Therapy/Group: Individual Therapy  Chipper Herb. Isiah Scheel, PT, DPT  10/04/2012, 10:12 AM

## 2012-10-04 NOTE — Progress Notes (Signed)
Speech Language Pathology Daily Session Note  Patient Details  Name: Gerald Leach MRN: [id number] Date of Birth: [birth year]  Today's Date: 10/04/2012 Time: 1200-1215 Time Calculation (min): 15 min  Short Term Goals: Week 1: SLP Short Term Goal 1 (Week 1): Pt will demonstrate sustained attention to task for 30 minutes with supervision verbal cues for redirection  SLP Short Term Goal 2 (Week 1): Pt will utilize external memory aids to increase recall of new, daily information with Min A verbal and question cues.  SLP Short Term Goal 3 (Week 1): Pt will identify 2 cognitive deficits with Min A semantic and question cues.  SLP Short Term Goal 4 (Week 1): Pt will demonstrate functional problem solving for basic and familiar tasks with Min A verbal cues.  SLP Short Term Goal 5 (Week 1): Pt will utilize swallowing compensatory strategies with supervision verbal cues to minimize overt s/s of aspiration   Skilled Therapeutic Interventions: Co-treatment, group session with OT to address safety with swallowing and self-feeding. SLP facilitated session with supervision verbal cues to utilize slow pace of self-feeding and min-mod assist cues to monitor left anterior loss of boluses. Patient consumed upgrade of Dys.2 textures and thin liquids with no overt s/s of aspiration. Continue with current plan of care.    FIM:  Comprehension Comprehension Mode: Auditory Comprehension: 4-Understands basic 75 - 89% of the time/requires cueing 10 - 24% of the time Expression Expression Mode: Verbal Expression: 4-Expresses basic 75 - 89% of the time/requires cueing 10 - 24% of the time. Needs helper to occlude trach/needs to repeat words. Social Interaction Social Interaction: 4-Interacts appropriately 75 - 89% of the time - Needs redirection for appropriate language or to initiate interaction. Problem Solving Problem Solving: 3-Solves basic 50 - 74% of the time/requires cueing 25 - 49% of the  time Memory Memory: 3-Recognizes or recalls 50 - 74% of the time/requires cueing 25 - 49% of the time FIM - Eating Eating Activity: 5: Supervision/cues;5: Set-up assist for open containers  Pain Pain Assessment Pain Assessment: No/denies pain  Therapy/Group: Group Therapy  Charlane Ferretti., CCC-SLP [phone]  Cap Massi 10/04/2012, 12:59 PM

## 2012-10-04 NOTE — Progress Notes (Signed)
Occupational Therapy Session Note  Patient Details  Name: Gerald Leach MRN: [id number] Date of Birth: 09-27-1933  Today's Date: 10/04/2012 Time: 1130-1200 Time Calculation (min): 30 min  Skilled Therapeutic Interventions: Co-treatment group session with SLP to with emphasis on improved attention, functional fine motor coordination, problem-solving, socializing and safety awareness as evidenced by compliance with upgraded diet (now DYS 2).   Patient able to perform self-feeding with supervision, although resisting cues and attention due to mild agitation.   Patient demo'd socially appropriate response to gesture from other patient who offered her ice cream to patient when he complained that his was not provided during this meal.  Pain: Pain Assessment Pain Assessment: 0-10 Pain Score:   5 Pain Type: Acute pain Pain Location: Head Pain Descriptors: Headache Pain Intervention(s): Medication (See eMAR)  See FIM for current functional status  Therapy/Group: Group Therapy  Georgeanne Nim 10/04/2012, 4:26 PM

## 2012-10-04 NOTE — Progress Notes (Signed)
[name], male   DOB: [birth year], 79 y.o.   MRN: [id number] Subjective/Complaints: No new issues. Slept well   Review of Systems  Neurological: Positive for headaches.  All other systems reviewed and are negative.    Objective: Vital Signs: Blood pressure 139/77, pulse 61, temperature 98.5 F (36.9 C), temperature source Oral, resp. rate 20, height 6' (1.829 m), weight 81.4 kg (179 lb 7.3 oz), SpO2 97.00%. No results found. Results for orders placed during the hospital encounter of 09/30/12 (from the past 72 hour(s))  GLUCOSE, CAPILLARY     Status: Abnormal   Collection Time    10/01/12 11:59 AM      Result Value Range   Glucose-Capillary 178 (*) 70 - 99 mg/dL   Comment 1 Notify RN    GLUCOSE, CAPILLARY     Status: Abnormal   Collection Time    10/01/12  5:01 PM      Result Value Range   Glucose-Capillary 127 (*) 70 - 99 mg/dL   Comment 1 Notify RN    GLUCOSE, CAPILLARY     Status: Abnormal   Collection Time    10/01/12  8:52 PM      Result Value Range   Glucose-Capillary 224 (*) 70 - 99 mg/dL   Comment 1 Notify RN    GLUCOSE, CAPILLARY     Status: Abnormal   Collection Time    10/02/12  7:27 AM      Result Value Range   Glucose-Capillary 131 (*) 70 - 99 mg/dL   Comment 1 Notify RN    GLUCOSE, CAPILLARY     Status: Abnormal   Collection Time    10/02/12 11:34 AM      Result Value Range   Glucose-Capillary 182 (*) 70 - 99 mg/dL  GLUCOSE, CAPILLARY     Status: Abnormal   Collection Time    10/02/12  4:55 PM      Result Value Range   Glucose-Capillary 142 (*) 70 - 99 mg/dL   Comment 1 Notify RN    GLUCOSE, CAPILLARY     Status: None   Collection Time    10/02/12  9:04 PM      Result Value Range   Glucose-Capillary 84  70 - 99 mg/dL   Comment 1 Notify RN    GLUCOSE, CAPILLARY     Status: Abnormal   Collection Time    10/03/12  7:23 AM      Result Value Range   Glucose-Capillary 129 (*) 70 - 99 mg/dL  GLUCOSE, CAPILLARY     Status: Abnormal   Collection Time    10/03/12 11:03 AM      Result Value Range   Glucose-Capillary 305 (*) 70 - 99 mg/dL  GLUCOSE, CAPILLARY     Status: Abnormal   Collection Time    10/03/12  4:42 PM      Result Value Range   Glucose-Capillary 130 (*) 70 - 99 mg/dL  GLUCOSE, CAPILLARY     Status: Abnormal   Collection Time    10/03/12  8:29 PM      Result Value Range   Glucose-Capillary 170 (*) 70 - 99 mg/dL     HEENT: normal and scalp incision without hematoma Cardio: RRR and no murmurs Resp: CTA B/L and unlabored GI: BS positive and non tender Extremity:  Pulses positive and No Edema Skin:   Wound C/D/I Neuro: Lethargic, Cranial Nerve II-XII normal,  [facility]MC Ataxic/ dec [facility] Musc/Skel:  Normal GEN: NAD  4/5 strength in bilateral deltoid, biceps, triceps, grip, hip flexor, knee extensors, ankle dorsiflexor plantar flexor   Demonstrates basic insight and awareness. Follows all commands. Attention does wane easily though.    Assessment/Plan: 1. Functional deficits secondary to R SDH which require 3+ hours per day of interdisciplinary therapy in a comprehensive inpatient rehab setting. Physiatrist is providing close team supervision and 24 hour management of active medical problems listed below. Physiatrist and rehab team continue to assess barriers to discharge/monitor patient progress toward functional and medical goals. FIM: FIM - Bathing Bathing Steps Patient Completed: Right Arm;Left Arm;Abdomen;Front perineal area;Chest;Right upper leg;Left upper leg;Right lower leg (including foot);Left lower leg (including foot) Bathing: 4: Min-Patient completes 8-9 74f 10 parts or 75+ percent  FIM - Upper Body Dressing/Undressing Upper body dressing/undressing steps patient completed: Thread/unthread right sleeve of pullover shirt/dresss;Thread/unthread left sleeve of pullover shirt/dress;Put head through opening of pull over shirt/dress;Pull shirt over trunk Upper body dressing/undressing: 5: Set-up assist  to: Obtain clothing/put away FIM - Lower Body Dressing/Undressing Lower body dressing/undressing steps patient completed: Thread/unthread left underwear leg;Thread/unthread left pants leg Lower body dressing/undressing: 4: Min-Patient completed 75 plus % of tasks  FIM - Toileting Toileting steps completed by patient: Performs perineal hygiene;Adjust clothing prior to toileting;Adjust clothing after toileting Toileting Assistive Devices: Grab bar or rail for support Toileting: 4: Steadying assist  FIM - Diplomatic Services operational officer Devices: Best boy Transfers: 4-To toilet/BSC: Min A (steadying Pt. > 75%);4-From toilet/BSC: Min A (steadying Pt. > 75%)  FIM - Bed/Chair Transfer Bed/Chair Transfer Assistive Devices: Arm rests Bed/Chair Transfer: 4: Supine > Sit: Min A (steadying Pt. > 75%/lift 1 leg);4: Sit > Supine: Min A (steadying pt. > 75%/lift 1 leg);4: Bed > Chair or W/C: Min A (steadying Pt. > 75%)  FIM - Locomotion: Wheelchair Locomotion: Wheelchair: 1: Travels less than 50 ft with minimal assistance (Pt.>75%) FIM - Locomotion: Ambulation Locomotion: Ambulation Assistive Devices: Designer, industrial/product Ambulation/Gait Assistance: 4: Min guard Locomotion: Ambulation: 2: Travels 50 - 149 ft with minimal assistance (Pt.>75%)  Comprehension Comprehension Mode: Auditory Comprehension: 4-Understands basic 75 - 89% of the time/requires cueing 10 - 24% of the time  Expression Expression Mode: Verbal Expression: 4-Expresses basic 75 - 89% of the time/requires cueing 10 - 24% of the time. Needs helper to occlude trach/needs to repeat words.  Social Interaction Social Interaction: 4-Interacts appropriately 75 - 89% of the time - Needs redirection for appropriate language or to initiate interaction.  Problem Solving Problem Solving: 3-Solves basic 50 - 74% of the time/requires cueing 25 - 49% of the time  Memory Memory: 2-Recognizes or recalls 25 - 49% of the  time/requires cueing 51 - 75% of the time Medical Problem List and Plan:  1. Right subdural hematoma. Status post craniotomy evacuation of hematoma 09/19/2012 and redo craniotomy 09/22/2012 for recurrent subdural hematoma  2. DVT Prophylaxis/Anticoagulation: SCDs. Monitor for any signs of DVT  3. Mood: Xanax 0.25 mg twice a day.    4. Dysphagia. Dysphagia 1 thin liquid diet. Monitor for signs of aspiration. Followup speech therapy   -intake good thus far 5. Neuropsych: This patient is not capable of making decisions on his/her own behalf.  6. Seizure prophylaxis. Keppra 500 mg twice a day  7. Hypertension. Tenormin 12.5 mg daily. Normotensive at present 8. Hypothyroidism. Synthroid  9. Non-insulin-dependent diabetes mellitus. Latest documented hemoglobin A1c of 6.3. Check blood sugars a.c. and at bedtime. Patient on Glucophage 500 mg twice a day prior to admission --250mg   bid initiated Thursday--will increase to home dose today 10. GERD. Protonix  11. Hyperlipidemia. Zocor     LOS (Days) 4 A FACE TO FACE EVALUATION WAS PERFORMED  Eriyanna Kofoed T 10/04/2012, 7:59 AM

## 2012-10-04 NOTE — Progress Notes (Signed)
Physical Therapy Note  Patient Details  Name: Gerald Leach MRN: [id number] Date of Birth: [birth year] Today's Date: 10/04/2012  Time:  1400-1500   Pain:  5/10  Head Groupl Session  Engaged in activities to promote standing balance, endurance, LUE NMRE.  Pt. Needed moderate cues to push from chair when going to stand and reaching back when going to sit.  Pt used dynamic balance moves in standing by reaching back for bean bag and rotating.     Humberto Seals 10/04/2012, 2:56 PM

## 2012-10-04 NOTE — Consult Note (Signed)
NEUROCOGNITIVE TESTING - [facility]   [name] is a 79 year old, right-handed, Caucasian man, who was seen for a brief neuropsychological assessment to evaluate cognitive and emotional functioning post-TBI.  According to his medical record, he was admitted on 09/19/2012 with altered mental status and vomiting for 24 hours.  CT scan revealed subdural hematoma.  He underwent right craniotomy hematoma evacuation on 09/19/2012.  Follow up CT demonstrated recurrent bleeding in the right subdural space and a repeat right craniotomy was performed on 09/22/2012.  He was extubated on 09/24/2012.  [name] reported that he fell down 10 stairs 6 months ago and lost consciousness for a few minutes, but denied experience of post-concussive amnesia and was not evaluated by a doctor until the next day.  He was reportedly not admitted to the hospital immediately following that event.  He stated that he had not noticed a change in his cognitive functioning until just prior to his current hospitalization.  Of note, his medical history is also significant for brain aneurysm requiring surgery in 2002.    PROCEDURES: [3 units of 16109 on 08/28/2012]  The following tests were performed during today's visit: Mini Mental Status Examination (MMSE-2) - Brief version, Repeatable Battery for the Assessment of Neuropsychological Status (RBANS, form A), Geriatric Depression Inventory and Geriatric Anxiety Inventory.  Test results are as follows:   MMSE-2 brief Raw Score = 12/16 Description = Borderline   RBANS Indices Scaled Score Percentile Description  Immediate Memory  85 16 Below Average  Visuospatial/Constructional 84 14 Below Average  Language 74 4 Impaired  Attention 56 < 1 Profoundly Impaired  Delayed Memory 64 1 Profoundly Impaired  Total Score 65 1 Profoundly Impaired   RBANS Subtests Raw Score Percentile Description  List Learning 17 3 Impaired  Story Memory 17 45 Average   Figure Copy 17 32 Average  Line Orientation 12 5 Impaired  Picture Naming 10 70 Average  Semantic Fluency 5 < 1 Profoundly Impaired  Digit Span 7 8 Impaired  Coding 11 < 1 Profoundly Impaired  List Recall 1 5 Impaired  List Recognition 16 < 1 Profoundly Impaired  Story Recall 7 18 Below Average  Figure recall 4 2 Profoundly Impaired   GDS Raw Score = 4 Description = WNL   GAI Raw Score = 0 Description = WNL   Test results revealed significant deficits with most notably difficulty in processing speed and attention.  Additional impairment was seen in rote verbal memory and ability to make fine visual distinctions.  His overall mental status was not suggestive of dementia at this time.  There was no evidence of significant mood disruption at this time.    IMPRESSIONS: [name] description of his head injury 6 months ago with recent discovery of subdural hematoma would be consistent with a complicated mild traumatic brain injury (mTBI).  The areas of greatest impairment seen on testing (e.g. initial encoding of rote information, processing speed, and attention) could certainly have resulted from such an injury.  Although we cannot definitively rule it out, his test results are not suggestive of the presence of an underlying neurodegenerative condition (e.g. Alzheimer's disease).  However, follow-up testing as an outpatient will be useful in tracking his cognitive recovery and in more definitively ruling out this possibility.    In light of these findings, the following recommendations are provided and were discussed with [name].  We also discussed the timeline for recovery that is expected following complicated mTBI.  [name]  repeatedly stated that he is ready to be discharged, but we explored the reasons why staff has not approved it yet.  As such, I encouraged him to take it one day at a time and try to get as much as possible out of his therapy sessions.       RECOMMENDATIONS:  Recommendations for treatment team:     When interacting with [name], directions and information should be provided in a simple, straight forward manner, and the treatment team should avoid giving multiple instructions simultaneously.    [name] requires more time than usual to process information.  Her treatment team may benefit from waiting for a verbal response to information before presenting additional information.     [name] may also benefit from being provided with multiple trials to learn new skills given the noted memory inefficiencies.    [name] demonstrated improvement in memory when information was presented within a context.  As such, those interacting with him may find that if they frame important details within a context, that he is better able to recall the information.     To the extent possible, multitasking should be avoided.   Performance will generally be best in a structured, routine, and familiar environment, as opposed to situations involving complex problems.   Recommendations for discharge planning:     Complete a comprehensive neuropsychological evaluation as an outpatient in 6-12 months to assess for interval change.  To schedule this with Dr. Wylene Simmer, he could contact her at: [phone].  This contact information should be provided to him upon discharge.     Maintain engagement in mentally, physically and cognitively stimulating activities.    Strive to maintain a healthy lifestyle (e.g., proper diet and exercise) in order to promote physical, cognitive and emotional health.   [name], Psy.D.  Clinical Neuropsychologist

## 2012-10-04 NOTE — Progress Notes (Signed)
Occupational Therapy Session Note  Patient Details  Name: Gerald Leach MRN: [id number] Date of Birth: [birth year]  Today's Date: 10/04/2012 Time: 2130-8657 Time Calculation (min): 25 min  Short Term Goals: Week 1:  OT Short Term Goal 1 (Week 1): Pt will demonstration orientation x4 with environmental cues OT Short Term Goal 2 (Week 1): Pt will don LB clothing with supervision OT Short Term Goal 3 (Week 1): Pt will selective attention in a moderate distracting environment with min cuing OT Short Term Goal 4 (Week 1): shower stall transfer with min A with appropriate DME  Skilled Therapeutic Interventions: ADL-retraining with emphasis on dynamic standing balance, attention and transfers planned this session however patient refused bathing/dressing stating his preference to continue eating his breakfast with nurse present.   OT provided copious attempts to re-motivate patient and improve alertness to promote addressing his rehab goals but patient resisted recommendations and continued to slowly eat his meal, stating, "I don't want to be here and I don't like being rushed to do something I don't want to do."   OT waited for patient to complete meal, with verbal cues, approx 15 minutes and displayed patient's schedule and to remind patient that schedules are provided to assist patient with preparing and organizing there day, however patient remained unimpressed and focused on his meal.   Clothing returned to patient's dresser; no further efforts at ADL-retraining were expended due to lack of patient participation.   Therapy Documentation Precautions:  Precautions Precautions: Fall Restrictions Weight Bearing Restrictions: No  General: General Amount of Missed OT Time (min): 20 Minutes  Pain: Pain Assessment Pain Assessment: 0-10 Pain Score:   5 Pain Type: Acute pain Pain Location: Head Pain Descriptors: Headache Pain Intervention(s): Medication (See eMAR)  See FIM for current  functional status  Therapy/Group: Individual Therapy  Georgeanne Nim 10/04/2012, 9:11 AM

## 2012-10-05 ENCOUNTER — Inpatient Hospital Stay (HOSPITAL_COMMUNITY): Payer: Medicare Other | Admitting: Physical Therapy

## 2012-10-05 LAB — GLUCOSE, CAPILLARY
Glucose-Capillary: 108 mg/dL — ABNORMAL HIGH (ref 70–99)
Glucose-Capillary: 143 mg/dL — ABNORMAL HIGH (ref 70–99)

## 2012-10-05 NOTE — Progress Notes (Signed)
[name], male   DOB: June 09, 1934, 79 y.o.   MRN: [id number] Subjective/Complaints: No new complaints. Occasional headache Review of Systems  Neurological: Positive for headaches.  All other systems reviewed and are negative.    Objective: Vital Signs: Blood pressure 130/76, pulse 65, temperature 98 F (36.7 C), temperature source Oral, resp. rate 17, height 6' (1.829 m), weight 81.4 kg (179 lb 7.3 oz), SpO2 100.00%. No results found. Results for orders placed during the hospital encounter of 09/30/12 (from the past 72 hour(s))  GLUCOSE, CAPILLARY     Status: Abnormal   Collection Time    10/02/12 11:34 AM      Result Value Range   Glucose-Capillary 182 (*) 70 - 99 mg/dL  GLUCOSE, CAPILLARY     Status: Abnormal   Collection Time    10/02/12  4:55 PM      Result Value Range   Glucose-Capillary 142 (*) 70 - 99 mg/dL   Comment 1 Notify RN    GLUCOSE, CAPILLARY     Status: None   Collection Time    10/02/12  9:04 PM      Result Value Range   Glucose-Capillary 84  70 - 99 mg/dL   Comment 1 Notify RN    GLUCOSE, CAPILLARY     Status: Abnormal   Collection Time    10/03/12  7:23 AM      Result Value Range   Glucose-Capillary 129 (*) 70 - 99 mg/dL  GLUCOSE, CAPILLARY     Status: Abnormal   Collection Time    10/03/12 11:03 AM      Result Value Range   Glucose-Capillary 305 (*) 70 - 99 mg/dL  GLUCOSE, CAPILLARY     Status: Abnormal   Collection Time    10/03/12  4:42 PM      Result Value Range   Glucose-Capillary 130 (*) 70 - 99 mg/dL  GLUCOSE, CAPILLARY     Status: Abnormal   Collection Time    10/03/12  8:29 PM      Result Value Range   Glucose-Capillary 170 (*) 70 - 99 mg/dL  GLUCOSE, CAPILLARY     Status: Abnormal   Collection Time    10/04/12  7:37 AM      Result Value Range   Glucose-Capillary 127 (*) 70 - 99 mg/dL  GLUCOSE, CAPILLARY     Status: Abnormal   Collection Time    10/04/12 11:20 AM      Result Value Range   Glucose-Capillary 175 (*)  70 - 99 mg/dL  GLUCOSE, CAPILLARY     Status: Abnormal   Collection Time    10/04/12  4:29 PM      Result Value Range   Glucose-Capillary 147 (*) 70 - 99 mg/dL  GLUCOSE, CAPILLARY     Status: Abnormal   Collection Time    10/04/12  9:04 PM      Result Value Range   Glucose-Capillary 158 (*) 70 - 99 mg/dL  GLUCOSE, CAPILLARY     Status: Abnormal   Collection Time    10/05/12  7:26 AM      Result Value Range   Glucose-Capillary 137 (*) 70 - 99 mg/dL     HEENT: normal and scalp incision without hematoma Cardio: RRR and no murmurs Resp: CTA B/L and unlabored GI: BS positive and non tender Extremity:  Pulses positive and No Edema Skin:   Wound C/D/I--staples in Neuro: Lethargic, Cranial Nerve II-XII normal,  [facility] Ataxic/ dec [facility]  Musc/Skel:  Normal GEN: NAD 4/5 strength in bilateral deltoid, biceps, triceps, grip, hip flexor, knee extensors, ankle dorsiflexor plantar flexor   Demonstrates basic insight and awareness. Follows all commands. Attention does wane easily though.    Assessment/Plan: 1. Functional deficits secondary to R SDH which require 3+ hours per day of interdisciplinary therapy in a comprehensive inpatient rehab setting. Physiatrist is providing close team supervision and 24 hour management of active medical problems listed below. Physiatrist and rehab team continue to assess barriers to discharge/monitor patient progress toward functional and medical goals. FIM: FIM - Bathing Bathing Steps Patient Completed: Right Arm;Left Arm;Abdomen;Front perineal area;Chest;Right upper leg;Left upper leg;Right lower leg (including foot);Left lower leg (including foot) Bathing: 4: Min-Patient completes 8-9 89f 10 parts or 75+ percent  FIM - Upper Body Dressing/Undressing Upper body dressing/undressing steps patient completed: Thread/unthread right sleeve of pullover shirt/dresss;Thread/unthread left sleeve of pullover shirt/dress;Put head through opening of pull over  shirt/dress;Pull shirt over trunk Upper body dressing/undressing: 5: Set-up assist to: Obtain clothing/put away FIM - Lower Body Dressing/Undressing Lower body dressing/undressing steps patient completed: Thread/unthread left underwear leg;Thread/unthread left pants leg Lower body dressing/undressing: 4: Min-Patient completed 75 plus % of tasks  FIM - Toileting Toileting steps completed by patient: Adjust clothing prior to toileting;Adjust clothing after toileting;Performs perineal hygiene Toileting Assistive Devices: Grab bar or rail for support Toileting: 6: More than reasonable amount of time  FIM - Diplomatic Services operational officer Devices: Grab bars Toilet Transfers: 5-To toilet/BSC: Supervision (verbal cues/safety issues);5-From toilet/BSC: Supervision (verbal cues/safety issues)  FIM - Bed/Chair Transfer Bed/Chair Transfer Assistive Devices: Therapist, occupational: 5: Supine > Sit: Supervision (verbal cues/safety issues)  FIM - Locomotion: Wheelchair Locomotion: Wheelchair: 0: Activity did not occur FIM - Locomotion: Ambulation Locomotion: Ambulation Assistive Devices: Designer, industrial/product Ambulation/Gait Assistance: 4: Min guard Locomotion: Ambulation: 2: Travels 50 - 149 ft with minimal assistance (Pt.>75%)  Comprehension Comprehension Mode: Auditory Comprehension: 4-Understands basic 75 - 89% of the time/requires cueing 10 - 24% of the time  Expression Expression Mode: Verbal Expression: 4-Expresses basic 75 - 89% of the time/requires cueing 10 - 24% of the time. Needs helper to occlude trach/needs to repeat words.  Social Interaction Social Interaction: 4-Interacts appropriately 75 - 89% of the time - Needs redirection for appropriate language or to initiate interaction.  Problem Solving Problem Solving: 3-Solves basic 50 - 74% of the time/requires cueing 25 - 49% of the time  Memory Memory: 3-Recognizes or recalls 50 - 74% of the time/requires cueing 25 -  49% of the time Medical Problem List and Plan:  1. Right subdural hematoma. Status post craniotomy evacuation of hematoma 09/19/2012 and redo craniotomy 09/22/2012 for recurrent subdural hematoma  2. DVT Prophylaxis/Anticoagulation: SCDs. Monitor for any signs of DVT  3. Mood: Xanax 0.25 mg twice a day.    4. Dysphagia. Dysphagia 1 thin liquid diet. Monitor for signs of aspiration. Followup speech therapy   -intake good thus far 5. Neuropsych: This patient is not capable of making decisions on his/her own behalf.  6. Seizure prophylaxis. Keppra 500 mg twice a day  7. Hypertension. Tenormin 12.5 mg daily. Normotensive at present 8. Hypothyroidism. Synthroid  9. Non-insulin-dependent diabetes mellitus. Latest documented hemoglobin A1c of 6.3. Check blood sugars a.c. and at bedtime. Patient on Glucophage 500 mg bid currently (home dose)  -sugars better 10. GERD. Protonix  11. Hyperlipidemia. Zocor  12. Wound: remove staples   LOS (Days) 5 A FACE TO FACE EVALUATION WAS PERFORMED  Jahmal Dunavant T  10/05/2012, 7:35 AM

## 2012-10-05 NOTE — Progress Notes (Signed)
Physical Therapy Note  Patient Details  Name: Gerald Leach MRN: [id number] Date of Birth: 05-Apr-1934 Today's Date: 10/05/2012  Time: [id number] 45 minutes  No c/o pain.  Treatment focused on community and outdoor gait training with RW.  Pt able to negotiate thresholds, carpet, tile and community indoor surfaces with supervision with RW.  For outdoor surfaces, sidewalks, incline/declines and uneven terrain pt requires min steadying assist.  Pt required min-mod cuing for problem solving obstacles and path finding in community settings.  Individual therapy   Orra Nolde 10/05/2012, 10:13 AM

## 2012-10-06 ENCOUNTER — Inpatient Hospital Stay (HOSPITAL_COMMUNITY): Payer: Medicare Other | Admitting: *Deleted

## 2012-10-06 ENCOUNTER — Inpatient Hospital Stay (HOSPITAL_COMMUNITY): Payer: Medicare Other | Admitting: Physical Therapy

## 2012-10-06 ENCOUNTER — Inpatient Hospital Stay (HOSPITAL_COMMUNITY): Payer: Medicare Other | Admitting: Speech Pathology

## 2012-10-06 ENCOUNTER — Inpatient Hospital Stay (HOSPITAL_COMMUNITY): Payer: Medicare Other

## 2012-10-06 DIAGNOSIS — S065X9A Traumatic subdural hemorrhage with loss of consciousness of unspecified duration, initial encounter: Secondary | ICD-10-CM

## 2012-10-06 LAB — GLUCOSE, CAPILLARY
Glucose-Capillary: 110 mg/dL — ABNORMAL HIGH (ref 70–99)
Glucose-Capillary: 156 mg/dL — ABNORMAL HIGH (ref 70–99)

## 2012-10-06 MED ORDER — PANTOPRAZOLE SODIUM 40 MG PO TBEC
40.0000 mg | DELAYED_RELEASE_TABLET | Freq: Every day | ORAL | Status: DC
  Administered 2012-10-07 – 2012-10-10 (×4): 40 mg via ORAL
  Filled 2012-10-06 (×4): qty 1

## 2012-10-06 MED ORDER — GLUCERNA SHAKE PO LIQD
237.0000 mL | ORAL | Status: DC
  Administered 2012-10-07 – 2012-10-09 (×3): 237 mL via ORAL

## 2012-10-06 NOTE — Progress Notes (Signed)
Physical Therapy Note  Patient Details  Name: JUN OSMENT MRN: [id number] Date of Birth: [birth year] Today's Date: 10/06/2012  Time: 1430-1455 25 minutes  No c/o pain.  Gait training in mod distracting environment with [employer] with pt able to self correct LOB, requires only supervision assist.  Gait in pt's room without AD with pt using furniture to walk when he lost his balance.  Pt educated on need for AD to prevent falls, pt states "this is how I do it at home, I will be fine".  Car transfer training to simulated sedan with pt able to perform at supervision level, cues for safety.  Individual therapy   DONAWERTH,KAREN 10/06/2012, 3:50 PM

## 2012-10-06 NOTE — Progress Notes (Signed)
Speech Language Pathology Daily Session Note  Patient Details  Name: Gerald Leach MRN: [id number] Date of Birth: [birth year]  Today's Date: 10/06/2012 Time: 0800-0840 Time Calculation (min): 40 min  Short Term Goals: Week 1: SLP Short Term Goal 1 (Week 1): Pt will demonstrate sustained attention to task for 30 minutes with supervision verbal cues for redirection  SLP Short Term Goal 2 (Week 1): Pt will utilize external memory aids to increase recall of new, daily information with Min A verbal and question cues.  SLP Short Term Goal 3 (Week 1): Pt will identify 2 cognitive deficits with Min A semantic and question cues.  SLP Short Term Goal 4 (Week 1): Pt will demonstrate functional problem solving for basic and familiar tasks with Min A verbal cues.  SLP Short Term Goal 5 (Week 1): Pt will utilize swallowing compensatory strategies with supervision verbal cues to minimize overt s/s of aspiration   Skilled Therapeutic Interventions: Treatment focus on dysphagia and cognitive goals. SLP facilitated session with min verbal cues to utilize a slow pace of self-feeding and small bites and Min visual and verbal cues to monitor left anterior loss of boluses. Pt demonstrated an intermittent throat clear throughout the meal, suspect due to mixed consistencies. Will continue to assess diet tolerance. Pt also demonstrated selective attention to meal with supervision verbal cues for 30 minutes.    FIM:  Comprehension Comprehension Mode: Auditory Comprehension: 5-Follows basic conversation/direction: With no assist Expression Expression Mode: Verbal Expression: 5-Expresses basic 90% of the time/requires cueing < 10% of the time. Social Interaction Social Interaction: 4-Interacts appropriately 75 - 89% of the time - Needs redirection for appropriate language or to initiate interaction. Problem Solving Problem Solving: 4-Solves basic 75 - 89% of the time/requires cueing 10 - 24% of the  time Memory Memory: 3-Recognizes or recalls 50 - 74% of the time/requires cueing 25 - 49% of the time FIM - Eating Eating Activity: 5: Supervision/cues  Pain Pain Assessment Pain Assessment: No/denies pain   Therapy/Group: Individual Therapy  [name] 10/06/2012, 3:17 PM

## 2012-10-06 NOTE — Progress Notes (Addendum)
Occupational Therapy Note  Patient Details  Name: Gerald Leach MRN: [id number] Date of Birth: January 29, 1934 Today's Date: 10/06/2012  Group session Pain:  3/10 head Time:  1130-1145  (15 min)   Focus on self feeding with focus on functional use of left UE with setup of tray, visual scanning to left, bilateral coordination with UEs, simple problem solving.  Pt.ate with increased time.  He stated he did not like the main course but like the applesauce and desert.     Humberto Seals 10/06/2012, 12:52 PM

## 2012-10-06 NOTE — Progress Notes (Signed)
[name], male   DOB: 05/19/1934, 79 y.o.   MRN: [id number] Subjective/Complaints: Up with therapy. No issues this morning. Fairly alert Review of Systems  Neurological: Positive for headaches.  All other systems reviewed and are negative.    Objective: Vital Signs: Blood pressure 116/75, pulse 61, temperature 98.3 F (36.8 C), temperature source Oral, resp. rate 19, height 6' (1.829 m), weight 81.4 kg (179 lb 7.3 oz), SpO2 99.00%. No results found. Results for orders placed during the hospital encounter of 09/30/12 (from the past 72 hour(s))  GLUCOSE, CAPILLARY     Status: Abnormal   Collection Time    10/03/12 11:03 AM      Result Value Range   Glucose-Capillary 305 (*) 70 - 99 mg/dL  GLUCOSE, CAPILLARY     Status: Abnormal   Collection Time    10/03/12  4:42 PM      Result Value Range   Glucose-Capillary 130 (*) 70 - 99 mg/dL  GLUCOSE, CAPILLARY     Status: Abnormal   Collection Time    10/03/12  8:29 PM      Result Value Range   Glucose-Capillary 170 (*) 70 - 99 mg/dL  GLUCOSE, CAPILLARY     Status: Abnormal   Collection Time    10/04/12  7:37 AM      Result Value Range   Glucose-Capillary 127 (*) 70 - 99 mg/dL  GLUCOSE, CAPILLARY     Status: Abnormal   Collection Time    10/04/12 11:20 AM      Result Value Range   Glucose-Capillary 175 (*) 70 - 99 mg/dL  GLUCOSE, CAPILLARY     Status: Abnormal   Collection Time    10/04/12  4:29 PM      Result Value Range   Glucose-Capillary 147 (*) 70 - 99 mg/dL  GLUCOSE, CAPILLARY     Status: Abnormal   Collection Time    10/04/12  9:04 PM      Result Value Range   Glucose-Capillary 158 (*) 70 - 99 mg/dL  GLUCOSE, CAPILLARY     Status: Abnormal   Collection Time    10/05/12  7:26 AM      Result Value Range   Glucose-Capillary 137 (*) 70 - 99 mg/dL  GLUCOSE, CAPILLARY     Status: Abnormal   Collection Time    10/05/12 11:49 AM      Result Value Range   Glucose-Capillary 108 (*) 70 - 99 mg/dL  GLUCOSE,  CAPILLARY     Status: Abnormal   Collection Time    10/05/12  4:53 PM      Result Value Range   Glucose-Capillary 120 (*) 70 - 99 mg/dL  GLUCOSE, CAPILLARY     Status: Abnormal   Collection Time    10/05/12  9:55 PM      Result Value Range   Glucose-Capillary 143 (*) 70 - 99 mg/dL   Comment 1 Notify RN    GLUCOSE, CAPILLARY     Status: Abnormal   Collection Time    10/06/12  7:21 AM      Result Value Range   Glucose-Capillary 110 (*) 70 - 99 mg/dL   Comment 1 Notify RN       HEENT: normal and scalp incision intact- staples out Cardio: RRR and no murmurs Resp: CTA B/L and unlabored GI: BS positive and non tender Extremity:  Pulses positive and No Edema Skin:   Wound C/D/I--staples in Neuro: Lethargic, Cranial Nerve II-XII normal,  [facility] Ataxic/ dec [facility] Musc/Skel:  Normal GEN: NAD 4/5 strength in bilateral deltoid, biceps, triceps, grip, hip flexor, knee extensors, ankle dorsiflexor plantar flexor   Demonstrates basic insight and awareness. Follows all commands. Attention remains an issue   Assessment/Plan: 1. Functional deficits secondary to R SDH which require 3+ hours per day of interdisciplinary therapy in a comprehensive inpatient rehab setting. Physiatrist is providing close team supervision and 24 hour management of active medical problems listed below. Physiatrist and rehab team continue to assess barriers to discharge/monitor patient progress toward functional and medical goals. FIM: FIM - Bathing Bathing Steps Patient Completed: Right Arm;Left Arm;Abdomen;Front perineal area;Chest;Right upper leg;Left upper leg;Right lower leg (including foot);Left lower leg (including foot) Bathing: 4: Min-Patient completes 8-9 46f 10 parts or 75+ percent  FIM - Upper Body Dressing/Undressing Upper body dressing/undressing steps patient completed: Thread/unthread right sleeve of pullover shirt/dresss;Thread/unthread left sleeve of pullover shirt/dress;Put head through opening of pull  over shirt/dress;Pull shirt over trunk Upper body dressing/undressing: 5: Set-up assist to: Obtain clothing/put away FIM - Lower Body Dressing/Undressing Lower body dressing/undressing steps patient completed: Thread/unthread right underwear leg;Thread/unthread left underwear leg;Pull underwear up/down;Thread/unthread right pants leg;Thread/unthread left pants leg;Pull pants up/down Lower body dressing/undressing: 5: Set-up assist to: Obtain clothing  FIM - Toileting Toileting steps completed by patient: Adjust clothing prior to toileting;Performs perineal hygiene;Adjust clothing after toileting Toileting Assistive Devices: Grab bar or rail for support Toileting: 5: Supervision: Safety issues/verbal cues  FIM - Diplomatic Services operational officer Devices: Grab bars;Walker Toilet Transfers: 5-To toilet/BSC: Supervision (verbal cues/safety issues);5-From toilet/BSC: Supervision (verbal cues/safety issues)  FIM - Press photographer Assistive Devices: Bed rails Bed/Chair Transfer: 5: Bed > Chair or W/C: Supervision (verbal cues/safety issues);4: Chair or W/C > Bed: Min A (steadying Pt. > 75%)  FIM - Locomotion: Wheelchair Locomotion: Wheelchair: 0: Activity did not occur FIM - Locomotion: Ambulation Locomotion: Ambulation Assistive Devices: Designer, industrial/product Ambulation/Gait Assistance: 4: Min guard Locomotion: Ambulation: 2: Travels 50 - 149 ft with minimal assistance (Pt.>75%)  Comprehension Comprehension Mode: Auditory Comprehension: 4-Understands basic 75 - 89% of the time/requires cueing 10 - 24% of the time  Expression Expression Mode: Verbal Expression: 4-Expresses basic 75 - 89% of the time/requires cueing 10 - 24% of the time. Needs helper to occlude trach/needs to repeat words.  Social Interaction Social Interaction: 4-Interacts appropriately 75 - 89% of the time - Needs redirection for appropriate language or to initiate interaction.  Problem  Solving Problem Solving: 3-Solves basic 50 - 74% of the time/requires cueing 25 - 49% of the time  Memory Memory: 3-Recognizes or recalls 50 - 74% of the time/requires cueing 25 - 49% of the time Medical Problem List and Plan:  1. Right subdural hematoma. Status post craniotomy evacuation of hematoma 09/19/2012 and redo craniotomy 09/22/2012 for recurrent subdural hematoma  2. DVT Prophylaxis/Anticoagulation: SCDs. Monitor for any signs of DVT  3. Mood: Xanax 0.25 mg twice a day.    4. Dysphagia. Dysphagia 1 thin liquid diet. Monitor for signs of aspiration. Followup speech therapy   -intake good thus far 5. Neuropsych: This patient is not capable of making decisions on his/her own behalf.  6. Seizure prophylaxis. Keppra 500 mg twice a day  7. Hypertension. Tenormin 12.5 mg daily. Normotensive at present 8. Hypothyroidism. Synthroid  9. Non-insulin-dependent diabetes mellitus.  . Patient on Glucophage 500 mg bid currently (home dose)  -sugars under reasonable control 10. GERD. Protonix  11. Hyperlipidemia. Zocor  12. Wound: remove staples  LOS (Days) 6 A FACE TO FACE EVALUATION WAS PERFORMED  SWARTZ,ZACHARY T 10/06/2012, 8:24 AM

## 2012-10-06 NOTE — Progress Notes (Signed)
Physical Therapy Session Note  Patient Details  Name: Gerald Leach MRN: [id number] Date of Birth: [birth year]  Today's Date: 10/06/2012 Time:1000-1055 55 minutes  No c/o pain.   Skilled Therapeutic Interventions/Progress Updates:    Sharlene Motts balance test performed, pt scored 35/56.  Pt educated on test results and increased risk of falls.  Pt continues to demo decreased awareness, stating "I will be fine at home".  Dynamic gait training with SPC as this is what pt plans to use at home.  Pt able to perform head turns and direction changes with SPC with supervision.  Nu step for UE/LE strengthening x 10 minutes level 4 with cues to keep steps per minute 55-60.  Stair negotiation with 1 handrail, 9 stairs x 2 with supervision.  Pt is at supervision level with all mobility using SPC, continues to be limited by decreased activity tolerance and impaired attention and awareness.  Therapy Documentation Balance: Standardized Balance Assessment Standardized Balance Assessment: Berg Balance Test Berg Balance Test Sit to Stand: Able to stand without using hands and stabilize independently Standing Unsupported: Able to stand safely 2 minutes Sitting with Back Unsupported but Feet Supported on Floor or Stool: Able to sit safely and securely 2 minutes Stand to Sit: Controls descent by using hands Transfers: Able to transfer safely, definite need of hands Standing Unsupported with Eyes Closed: Able to stand 10 seconds safely Standing Ubsupported with Feet Together: Needs help to attain position but able to stand for 30 seconds with feet together From Standing, Reach Forward with Outstretched Arm: Can reach forward >12 cm safely (5") From Standing Position, Pick up Object from Floor: Able to pick up shoe, needs supervision From Standing Position, Turn to Look Behind Over each Shoulder: Turn sideways only but maintains balance Turn 360 Degrees: Needs close supervision or verbal cueing Standing Unsupported,  Alternately Place Feet on Step/Stool: Able to complete >2 steps/needs minimal assist Standing Unsupported, One Foot in Front: Able to take small step independently and hold 30 seconds Standing on One Leg: Unable to try or needs assist to prevent fall Total Score: 35  See FIM for current functional status  Therapy/Group: Individual Therapy  Azarria Balint 10/06/2012, 10:18 AM

## 2012-10-06 NOTE — Progress Notes (Signed)
Speech Language Pathology Daily Session Note  Patient Details  Name: Gerald Leach MRN: [id number] Date of Birth: 08-Sep-1933  Today's Date: 10/06/2012 Time: 1145-1200 Time Calculation (min): 15 min  Short Term Goals: Week 1: SLP Short Term Goal 1 (Week 1): Pt will demonstrate sustained attention to task for 30 minutes with supervision verbal cues for redirection  SLP Short Term Goal 2 (Week 1): Pt will utilize external memory aids to increase recall of new, daily information with Min A verbal and question cues.  SLP Short Term Goal 3 (Week 1): Pt will identify 2 cognitive deficits with Min A semantic and question cues.  SLP Short Term Goal 4 (Week 1): Pt will demonstrate functional problem solving for basic and familiar tasks with Min A verbal cues.  SLP Short Term Goal 5 (Week 1): Pt will utilize swallowing compensatory strategies with supervision verbal cues to minimize overt s/s of aspiration   Skilled Therapeutic Interventions: Co-treatment, group session with OT to address safety with swallowing and self-feeding. SLP facilitated session with supervision level cues to utilize slow pace of self-feeding and Min visual cues to monitor left anterior loss of boluses. Pt with intermittent throat clear prior to presentation of lunch tray that was noted throughout meal as well. Will continue to assess diet tolerance. Continue with current plan of care.    FIM:  Comprehension Comprehension Mode: Auditory Comprehension: 5-Follows basic conversation/direction: With no assist Expression Expression Mode: Verbal Expression: 5-Expresses basic needs/ideas: With extra time/assistive device Social Interaction Social Interaction: 4-Interacts appropriately 75 - 89% of the time - Needs redirection for appropriate language or to initiate interaction. Problem Solving Problem Solving: 5-Solves basic 90% of the time/requires cueing < 10% of the time Memory Memory: 3-Recognizes or recalls 50 - 74% of the  time/requires cueing 25 - 49% of the time FIM - Eating Eating Activity: 5: Supervision/cues  Pain Pain Assessment Pain Assessment: No/denies pain Pain Score:   3 ("always have a little pain")  Therapy/Group: Group Therapy   Maxcine Ham, M.A. CF-SLP  Maxcine Ham 10/06/2012, 12:35 PM

## 2012-10-06 NOTE — Progress Notes (Signed)
NUTRITION FOLLOW UP  Intervention:   1. Change Ensure Pudding to Glucerna Shake per RN request; however please note that Glucerna Shakes and Ensure Pudding differ by <1 gram of carbohydrate per serving. 2. RD to continue to follow nutrition care plan  Nutrition Dx:   Increased nutrient needs related to acute injury as evidenced by estimated needs. Ongoing.  Goal:   Intake to meet >90% of estimated nutrition needs. Met.  Monitor:   weight trends, lab trends, I/O's, PO intake, supplement tolerance  Assessment:   Admitted 4/4 with AMS and vomiting. CT revealed subdural hematoma. Pt remained NPO with NGT for nutrition support, diet advanced to Dysphagia 2 with thins. Pt was followed by RD staff during acute hospitalization. Pt was started on Magic Cup supplements.  RD asked to see pt by RN for adjustment of oral nutrition supplements. Per RN, pt will take a few bites of Ensure Pudding however pt was provided with Glucerna Shake over the weekend 2/2 elevated blood sugars and pt enjoyed that supplement. Discussed with RN that difference in carbohydrate content of supplements is negligible.  Advanced to Dysphagia 2 diet with thin liquids 4/19.  Height: Ht Readings from Last 1 Encounters:  09/30/12 6' (1.829 m)    Weight Status:   Wt Readings from Last 1 Encounters:  10/01/12 179 lb 7.3 oz (81.4 kg)    Re-estimated needs:  Kcal: 1900 - 2200 Protein: 99 - 110  Fluid: 1.9 - 2.2 liters  Skin: R head incision  Diet Order: Dysphagia 2 with thins   Intake/Output Summary (Last 24 hours) at 10/06/12 1234 Last data filed at 10/06/12 0800  Gross per 24 hour  Intake    840 ml  Output      0 ml  Net    840 ml    Last BM: 4/20   Labs:   Recent Labs Lab 10/01/12 0610  NA 137  K 4.5  CL 102  CO2 26  BUN 12  CREATININE 0.65  CALCIUM 9.1  GLUCOSE 141*    CBG (last 3)   Recent Labs  10/05/12 2155 10/06/12 0721 10/06/12 1120  GLUCAP 143* 110* 156*    Scheduled  Meds: . antiseptic oral rinse  15 mL Mouth Rinse QHS  . atenolol  12.5 mg Oral Daily  . feeding supplement  1 Container Oral TID BM  . insulin aspart  0-15 Units Subcutaneous TID WC  . levETIRAcetam  500 mg Oral BID  . levothyroxine  50 mcg Oral QAC breakfast  . metFORMIN  500 mg Oral BID WC  . multivitamin with minerals  1 tablet Oral Daily  . pantoprazole  40 mg Oral Daily  . simvastatin  10 mg Oral QHS    Continuous Infusions:  none  Jarold Motto MS, RD, LDN Pager: [phone] After-hours pager: [phone]

## 2012-10-06 NOTE — Progress Notes (Signed)
Occupational Therapy Note  Patient Details  Name: Gerald Leach MRN: [id number] Date of Birth: January 03, 1934 Today's Date: 10/06/2012 Time:  1300-1330  (30 min) Pain:  3/10 Head Individual session  Engaged in functional mobility, attention to left, LUE NMRE, Bilateral upper extremity coordination, balance in dynamic positions.  Pt. Lying in bed upon OT arrival.  Explained purpose of session.  Pt was minimal assist with supine to EOB.  Ambulated with Nectar to rehab gym.  Engaged in horseshoes for balance, coordination, attending to left, LUE NMRE.  Pt. Was supervision with bending to get items off the floor.  Pt.carried 10 horseshoes on left arm and walked to shelves to put them up at supervision level.  Ambulated back to room with Armstrong.     Humberto Seals 10/06/2012, 1:37 PM

## 2012-10-06 NOTE — Progress Notes (Signed)
[facility] Individual Statement of Services  Patient Name:  Gerald Leach  Date:  10/06/2012  Welcome to the [facility].  Our goal is to provide you with an individualized program based on your diagnosis and situation, designed to meet your specific needs.  With this comprehensive rehabilitation program, you will be expected to participate in at least 3 hours of rehabilitation therapies Monday-Friday, with modified therapy programming on the weekends.  Your rehabilitation program will include the following services:  Physical Therapy (PT), Occupational Therapy (OT), Speech Therapy (ST), 24 hour per day rehabilitation nursing, Therapeutic Recreaction (TR), Neuropsychology, Case Management ( Social Worker), Rehabilitation Medicine, Nutrition Services and Pharmacy Services  Weekly team conferences will be held on Tuesdays to discuss your progress.  Your Social Worker will talk with you frequently to get your input and to update you on team discussions.  Team conferences with you and your family in attendance may also be held.  Estimated length of stay: 10-14 days  Overall anticipated outcome: supervision  Depending on your progress and recovery, your program may change. Your Social Worker will coordinate services and will keep you informed of any changes. Your Social Worker's name and contact numbers are listed  below.  The following services may also be recommended but are not provided by the [facility]:   Driving Evaluations  Home Health Rehabiltiation Services  Outpatient Rehabilitatation Servives    Arrangements will be made to provide these services after discharge if needed.  Arrangements include referral to agencies that provide these services.  Your insurance has been verified to be:  [facility] Medicare Your primary doctor is:  Dr. Alonza Smoker  Pertinent information will be shared with your doctor and your insurance  company.  Social Worker:  [place], [place] [phone] or ([phone]  Information discussed with and copy given to patient by: Amada Jupiter, 10/06/2012, 11:04 AM

## 2012-10-06 NOTE — Progress Notes (Signed)
Occupational Therapy Session Note  Patient Details  Name: Gerald Leach MRN: [id number] Date of Birth: [birth year]  Today's Date: 10/06/2012 Time: 0900-0956 Time Calculation (min): 56 min  Short Term Goals: Week 1:  OT Short Term Goal 1 (Week 1): Pt will demonstration orientation x4 with environmental cues OT Short Term Goal 2 (Week 1): Pt will don LB clothing with supervision OT Short Term Goal 3 (Week 1): Pt will selective attention in a moderate distracting environment with min cuing OT Short Term Goal 4 (Week 1): shower stall transfer with min A with appropriate DME  Skilled Therapeutic Interventions/Progress Updates:    Pt on toilet upon arrival but agreeable to bathing at shower level and dressing with sit<>stand from EOB.  Pt completed all tasks at supervision level with mod verbal cues for safety awareness.  Pt becomes agitated when corrected and states that he "knows what to do."  Focus on safety awareness, dynamic standing balance, funcitonal ambulation with RW for home mgmt tasks, and activity tolerance.  Therapy Documentation Precautions:  Precautions Precautions: Fall Restrictions Weight Bearing Restrictions: No Pain: Pain Assessment Pain Assessment: No/denies pain  See FIM for current functional status  Therapy/Group: Individual Therapy  Rich Brave 10/06/2012, 9:58 AM

## 2012-10-07 ENCOUNTER — Inpatient Hospital Stay (HOSPITAL_COMMUNITY): Payer: Medicare Other | Admitting: Occupational Therapy

## 2012-10-07 ENCOUNTER — Inpatient Hospital Stay (HOSPITAL_COMMUNITY): Payer: Medicare Other

## 2012-10-07 ENCOUNTER — Inpatient Hospital Stay (HOSPITAL_COMMUNITY): Payer: Medicare Other | Admitting: Physical Therapy

## 2012-10-07 ENCOUNTER — Inpatient Hospital Stay (HOSPITAL_COMMUNITY): Payer: Medicare Other | Admitting: Speech Pathology

## 2012-10-07 LAB — GLUCOSE, CAPILLARY
Glucose-Capillary: 131 mg/dL — ABNORMAL HIGH (ref 70–99)
Glucose-Capillary: 130 mg/dL — ABNORMAL HIGH (ref 70–99)
Glucose-Capillary: 122 mg/dL — ABNORMAL HIGH (ref 70–99)
Glucose-Capillary: 106 mg/dL — ABNORMAL HIGH (ref 70–99)

## 2012-10-07 NOTE — Progress Notes (Signed)
Occupational Therapy Note  Patient Details  Name: SKYLOR SCHNAPP MRN: [id number] Date of Birth: 1934-04-25 Today's Date: 10/07/2012  Time: 1130-1200 Pt denies pain Group therapy (cotx with Speech Therapy-total time 0865-7846) Pt participated in self feeding group with focus on attention to tasks and adhering to swallowing strategies.  Pt continues to require verbal cues to adhere to swallowing strategies and attend to tasks.  Pt ambulated with SPC to room at end of session and declined to sit in recliner or return to bed.  Pt stated he wanted to sit in one of the other chairs in the room.  Pt did not understand why he must sit next to bed and within reach of call bell.  Pt stated he didn't understand why he must call for assistance.  Reeducated patient on importance of safety when walking in room and requirement for supervision.   [name] [facility] 10/07/2012, 2:31 PM

## 2012-10-07 NOTE — Progress Notes (Signed)
Occupational Therapy Session Note  Patient Details  Name: Gerald Leach MRN: [id number] Date of Birth: 11/24/1933  Today's Date: 10/07/2012 Time: 0905-1000 Time Calculation (min): 55 min  Short Term Goals: Week 1:  OT Short Term Goal 1 (Week 1): Pt will demonstration orientation x4 with environmental cues OT Short Term Goal 2 (Week 1): Pt will don LB clothing with supervision OT Short Term Goal 3 (Week 1): Pt will selective attention in a moderate distracting environment with min cuing OT Short Term Goal 4 (Week 1): shower stall transfer with min A with appropriate DME  Skilled Therapeutic Interventions/Progress Updates:    Pt engaged in bathing and dressing tasks in room.  Pt used SPC to ambulate in room and often would place SPC to side to walk to another area in room.  Pt stated he didn't need cane at all times and would be okay.  Pt did not exhibit any LOB during session.  Focus on safety awareness, activity tolerance, and dynamic standing balance.  Pt continues to exhibit decreased safety awareness and requires supervision when engaging in self care tasks.  Therapy Documentation Precautions:  Precautions Precautions: Fall Restrictions Weight Bearing Restrictions: No Pain: Pain Assessment Pain Assessment: No/denies pain  See FIM for current functional status  Therapy/Group: Individual Therapy  Rich Brave 10/07/2012, 12:22 PM

## 2012-10-07 NOTE — Progress Notes (Signed)
Speech Language Pathology Daily Session Note  Patient Details  Name: Gerald Leach MRN: [id number] Date of Birth: Jan 05, 1934  Today's Date: 10/07/2012 Time: 4098-1191 Time Calculation (min): 45 min  Short Term Goals: Week 1: SLP Short Term Goal 1 (Week 1): Pt will demonstrate sustained attention to task for 30 minutes with supervision verbal cues for redirection  SLP Short Term Goal 2 (Week 1): Pt will utilize external memory aids to increase recall of new, daily information with Min A verbal and question cues.  SLP Short Term Goal 3 (Week 1): Pt will identify 2 cognitive deficits with Min A semantic and question cues.  SLP Short Term Goal 4 (Week 1): Pt will demonstrate functional problem solving for basic and familiar tasks with Min A verbal cues.  SLP Short Term Goal 5 (Week 1): Pt will utilize swallowing compensatory strategies with supervision verbal cues to minimize overt s/s of aspiration   Skilled Therapeutic Interventions: Treatment focus on cognitive and dysphagia goals. Upon entering the room, the pt reported he needed to use the bathroom. Pt ambulated to bathroom with hand held assist due to refusal to use the cane. Pt perseverative on self-care tasks and required Min verbal and visual cues for redirection.  Pt independently asked appropriate questions in regards to d/c planning and was able to demonstrate alternating attention between conversation and self-feeding with supervision verbal cues.  Pt consumed Dys. 2 textures with thin liquids and required supervision verbal cues for small bites. Pt demonstrated an intermittent throat clear X 2, suspect due to mixed consistencies.    FIM:  Comprehension Comprehension Mode: Auditory Comprehension: 5-Follows basic conversation/direction: With extra time/assistive device Expression Expression Mode: Verbal Expression: 5-Expresses basic 90% of the time/requires cueing < 10% of the time. Social Interaction Social Interaction: 4-Interacts  appropriately 75 - 89% of the time - Needs redirection for appropriate language or to initiate interaction. Problem Solving Problem Solving: 4-Solves basic 75 - 89% of the time/requires cueing 10 - 24% of the time Memory Memory: 4-Recognizes or recalls 75 - 89% of the time/requires cueing 10 - 24% of the time FIM - Eating Eating Activity: 5: Supervision/cues  Pain Pain Assessment Pain Assessment: No/denies pain  Therapy/Group: Individual Therapy  Trentan Trippe 10/07/2012, 3:56 PM

## 2012-10-07 NOTE — Progress Notes (Signed)
[name], male   DOB: [birth year], 79 y.o.   MRN: [id number] Subjective/Complaints: Progressing with therapy. Still requires supervision due to cognitive issues  Review of Systems  Neurological: Positive for headaches still.  All other systems reviewed and are negative.    Objective: Vital Signs: Blood pressure 115/74, pulse 64, temperature 97.4 F (36.3 C), temperature source Oral, resp. rate 17, height 6' (1.829 m), weight 81.4 kg (179 lb 7.3 oz), SpO2 96.00%. No results found. Results for orders placed during the hospital encounter of 09/30/12 (from the past 72 hour(s))  GLUCOSE, CAPILLARY     Status: Abnormal   Collection Time    10/04/12 11:20 AM      Result Value Range   Glucose-Capillary 175 (*) 70 - 99 mg/dL  GLUCOSE, CAPILLARY     Status: Abnormal   Collection Time    10/04/12  4:29 PM      Result Value Range   Glucose-Capillary 147 (*) 70 - 99 mg/dL  GLUCOSE, CAPILLARY     Status: Abnormal   Collection Time    10/04/12  9:04 PM      Result Value Range   Glucose-Capillary 158 (*) 70 - 99 mg/dL  GLUCOSE, CAPILLARY     Status: Abnormal   Collection Time    10/05/12  7:26 AM      Result Value Range   Glucose-Capillary 137 (*) 70 - 99 mg/dL  GLUCOSE, CAPILLARY     Status: Abnormal   Collection Time    10/05/12 11:49 AM      Result Value Range   Glucose-Capillary 108 (*) 70 - 99 mg/dL  GLUCOSE, CAPILLARY     Status: Abnormal   Collection Time    10/05/12  4:53 PM      Result Value Range   Glucose-Capillary 120 (*) 70 - 99 mg/dL  GLUCOSE, CAPILLARY     Status: Abnormal   Collection Time    10/05/12  9:55 PM      Result Value Range   Glucose-Capillary 143 (*) 70 - 99 mg/dL   Comment 1 Notify RN    GLUCOSE, CAPILLARY     Status: Abnormal   Collection Time    10/06/12  7:21 AM      Result Value Range   Glucose-Capillary 110 (*) 70 - 99 mg/dL   Comment 1 Notify RN    GLUCOSE, CAPILLARY     Status: Abnormal   Collection Time    10/06/12 11:20 AM       Result Value Range   Glucose-Capillary 156 (*) 70 - 99 mg/dL   Comment 1 Notify RN    GLUCOSE, CAPILLARY     Status: Abnormal   Collection Time    10/06/12  5:08 PM      Result Value Range   Glucose-Capillary 102 (*) 70 - 99 mg/dL  GLUCOSE, CAPILLARY     Status: Abnormal   Collection Time    10/06/12  8:52 PM      Result Value Range   Glucose-Capillary 149 (*) 70 - 99 mg/dL     HEENT: normal and scalp incision intact- staples out Cardio: RRR and no murmurs Resp: CTA B/L and unlabored GI: BS positive and non tender Extremity:  Pulses positive and No Edema Skin:   Wound C/D/I--staples in Neuro: Lethargic, Cranial Nerve II-XII normal,  FMC Ataxic/ dec FMC Musc/Skel:  Normal GEN: NAD 4/5 strength in bilateral deltoid, biceps, triceps, grip, hip flexor, knee extensors, ankle dorsiflexor plantar flexor  Demonstrates basic insight and awareness. Follows all commands. Attention perhaps a little better   Assessment/Plan: 1. Functional deficits secondary to R SDH which require 3+ hours per day of interdisciplinary therapy in a comprehensive inpatient rehab setting. Physiatrist is providing close team supervision and 24 hour management of active medical problems listed below. Physiatrist and rehab team continue to assess barriers to discharge/monitor patient progress toward functional and medical goals. FIM: FIM - Bathing Bathing Steps Patient Completed: Chest;Right Arm;Left Arm;Abdomen;Front perineal area;Buttocks;Left lower leg (including foot);Right lower leg (including foot);Left upper leg;Right upper leg Bathing: 5: Supervision: Safety issues/verbal cues  FIM - Upper Body Dressing/Undressing Upper body dressing/undressing steps patient completed: Thread/unthread right sleeve of pullover shirt/dresss;Thread/unthread left sleeve of pullover shirt/dress;Pull shirt over trunk;Put head through opening of pull over shirt/dress Upper body dressing/undressing: 5: Supervision: Safety  issues/verbal cues FIM - Lower Body Dressing/Undressing Lower body dressing/undressing steps patient completed: Thread/unthread right underwear leg;Thread/unthread left underwear leg;Pull underwear up/down;Thread/unthread right pants leg;Thread/unthread left pants leg;Pull pants up/down;Fasten/unfasten pants;Don/Doff right sock;Don/Doff left sock Lower body dressing/undressing: 5: Supervision: Safety issues/verbal cues  FIM - Toileting Toileting steps completed by patient: Adjust clothing prior to toileting;Adjust clothing after toileting;Performs perineal hygiene Toileting Assistive Devices: Grab bar or rail for support Toileting: 5: Supervision: Safety issues/verbal cues  FIM - Archivist Transfers Assistive Devices: Elevated toilet seat;Grab bars;Walker Toilet Transfers: 5-To toilet/BSC: Supervision (verbal cues/safety issues);5-From toilet/BSC: Supervision (verbal cues/safety issues)  FIM - Banker Devices: Bed rails Bed/Chair Transfer: 5: Bed > Chair or W/C: Supervision (verbal cues/safety issues);4: Chair or W/C > Bed: Min A (steadying Pt. > 75%)  FIM - Locomotion: Wheelchair Locomotion: Wheelchair: 0: Activity did not occur FIM - Locomotion: Ambulation Locomotion: Ambulation Assistive Devices: Designer, industrial/product Ambulation/Gait Assistance: 4: Min guard Locomotion: Ambulation: 2: Travels 50 - 149 ft with minimal assistance (Pt.>75%)  Comprehension Comprehension Mode: Auditory Comprehension: 5-Follows basic conversation/direction: With no assist  Expression Expression Mode: Verbal Expression: 5-Expresses basic 90% of the time/requires cueing < 10% of the time.  Social Interaction Social Interaction: 4-Interacts appropriately 75 - 89% of the time - Needs redirection for appropriate language or to initiate interaction.  Problem Solving Problem Solving: 4-Solves basic 75 - 89% of the time/requires cueing 10 - 24% of the  time  Memory Memory: 3-Recognizes or recalls 50 - 74% of the time/requires cueing 25 - 49% of the time Medical Problem List and Plan:  1. Right subdural hematoma. Status post craniotomy evacuation of hematoma 09/19/2012 and redo craniotomy 09/22/2012 for recurrent subdural hematoma  2. DVT Prophylaxis/Anticoagulation: SCDs. Monitor for any signs of DVT  3. Mood: Xanax 0.25 mg twice a day.    4. Dysphagia. Dysphagia 2 thin liquid diet. No aspiration signs   -intake good thus far 5. Neuropsych: This patient is not capable of making decisions on his/her own behalf.  6. Seizure prophylaxis. Keppra 500 mg twice a day  7. Hypertension. Tenormin 12.5 mg daily. Normotensive at present 8. Hypothyroidism. Synthroid  9. Non-insulin-dependent diabetes mellitus.  . Patient on Glucophage 500 mg bid currently (home dose)  -sugars under reasonable control 10. GERD. Protonix  11. Hyperlipidemia. Zocor  12. Wound: removed staples   LOS (Days) 7 A FACE TO FACE EVALUATION WAS PERFORMED  SWARTZ,ZACHARY T 10/07/2012, 7:41 AM

## 2012-10-07 NOTE — Progress Notes (Signed)
Occupational Therapy Session Note  Patient Details  Name: Gerald Leach MRN: [id number] Date of Birth: 1933-09-25  Today's Date: 10/07/2012 Time: 1015-1057 Time Calculation (min): 42 min   Skilled Therapeutic Interventions/Progress Updates:    1:1 Focus on navigation and path finding around unit- requiring min to mod subtle cuing. In the gym focus on static and dynamic standing balance balance on red Balance board. Pt required more than reasonable time to to find stability in static with min HHA, practiced simple weight shifts while maintaining balance and reach to both sides to obtain objects (horseshoes), and then throw them to target. Pt required A to go from flexed trunk position to upright trunk extension and maintain balance. Discussed how his balance was affected by the TBI and reinforced someone needing to be with him. Practiced floor transfer with min A going into standing from floor.  Therapy Documentation Precautions:  Precautions Precautions: Fall Restrictions Weight Bearing Restrictions: No Pain:  soreness around incision site of head ; "tender"  See FIM for current functional status  Therapy/Group: Individual Therapy  Roney Mans [facility] S[facility] 10/07/2012, 10:59 AM

## 2012-10-07 NOTE — Progress Notes (Signed)
Physical Therapy Note  Patient Details  Name: Gerald Leach MRN: [id number] Date of Birth: [birth year] Today's Date: 10/07/2012  Time 1: 1100-1130 30 minutes  1:1 No c/o pain.  Gait with SPC in controlled and home environments with supervision, pt able to self correct LOB.  Stretching to B hips and LEs in supine position.  Pt with noted tightness in B hip IR/ER and adductors.  Pt reports some pain relief after stretching.  Time 2: 1415-1430 15 minutes  1:1 No c/o pain, pt c/o fatigue.  Pt unwilling to get OOB for therapy due to fatigue.  Addressed awareness and attention goals with pt requiring min-mod A for emergent awareness, decreased memory noted and delayed processing. Pt with little awareness of deficits.   Colsen Modi 10/07/2012, 12:10 PM

## 2012-10-07 NOTE — Progress Notes (Signed)
Occupational Therapy Session Note  Patient Details  Name: Gerald Leach MRN: [id number] Date of Birth: [birth year]  Today's Date: 10/07/2012 Time: 1330-1400 Time Calculation (min): 30 min   Skilled Therapeutic Interventions/Progress Updates:    1:1 focus on navigation and path finding down to gift shop with cane for functional ambulation, functional problem solving, working memory, visual scanning in distracting environment, dynamic balance with head turns. Pt was instructed to locate 5 items on a list in the gift shop- pt needed min A to use list in gift shop to find items and needed to walk around the store twice to find all the items- demonstrating decreased attention to task at hand. Pt unable to find way back to room - requiring max A.  Therapy Documentation Precautions:  Precautions Precautions: Fall Restrictions Weight Bearing Restrictions: No Pain: Pain Assessment Pain Assessment: No/denies pain  See FIM for current functional status  Therapy/Group: Individual Therapy  Roney Mans [facility] 10/07/2012, 3:46 PM

## 2012-10-07 NOTE — Progress Notes (Signed)
Speech Language Pathology Daily Session Note  Patient Details  Name: Gerald Leach MRN: [id number] Date of Birth: 1934-02-17  Today's Date: 10/07/2012 Time: 1200-1215 Time Calculation (min): 15 min  Short Term Goals: Week 1: SLP Short Term Goal 1 (Week 1): Pt will demonstrate sustained attention to task for 30 minutes with supervision verbal cues for redirection  SLP Short Term Goal 2 (Week 1): Pt will utilize external memory aids to increase recall of new, daily information with Min A verbal and question cues.  SLP Short Term Goal 3 (Week 1): Pt will identify 2 cognitive deficits with Min A semantic and question cues.  SLP Short Term Goal 4 (Week 1): Pt will demonstrate functional problem solving for basic and familiar tasks with Min A verbal cues.  SLP Short Term Goal 5 (Week 1): Pt will utilize swallowing compensatory strategies with supervision verbal cues to minimize overt s/s of aspiration   Skilled Therapeutic Interventions: Co-treatment, group session with OT to address safety with swallowing and self-feeding. SLP facilitated session with min assist level verbal cues to utilize slow pace of self-feeding and intermittent throat clear. Patient consumed Dys.2 textures, which were prepared more like Dys.3 textures and thin liquids with prolonged mastication and intermittent episodes of wet vocal quality. Recommend to continue with current plan of care.      FIM:  FIM - Eating Eating Activity: 5: Supervision/cues  Pain Pain Assessment Pain Assessment: No/denies pain  Therapy/Group: Group Therapy  Charlane Ferretti., CCC-SLP [phone]  Vinessa Macconnell 10/07/2012, 12:23 PM

## 2012-10-08 ENCOUNTER — Inpatient Hospital Stay (HOSPITAL_COMMUNITY): Payer: Medicare Other | Admitting: Speech Pathology

## 2012-10-08 ENCOUNTER — Inpatient Hospital Stay (HOSPITAL_COMMUNITY): Payer: Medicare Other | Admitting: Physical Therapy

## 2012-10-08 ENCOUNTER — Inpatient Hospital Stay (HOSPITAL_COMMUNITY): Payer: Medicare Other

## 2012-10-08 LAB — GLUCOSE, CAPILLARY
Glucose-Capillary: 126 mg/dL — ABNORMAL HIGH (ref 70–99)
Glucose-Capillary: 195 mg/dL — ABNORMAL HIGH (ref 70–99)
Glucose-Capillary: 120 mg/dL — ABNORMAL HIGH (ref 70–99)

## 2012-10-08 NOTE — Progress Notes (Signed)
Occupational Therapy Session Note  Patient Details  Name: Gerald Leach MRN: [id number] Date of Birth: October 16, 1933  Today's Date: 10/08/2012  Session 1 Time: 3244-0102 Time Calculation (min): 54 min  Short Term Goals: Week 1:  OT Short Term Goal 1 (Week 1): Pt will demonstration orientation x4 with environmental cues OT Short Term Goal 2 (Week 1): Pt will don LB clothing with supervision OT Short Term Goal 3 (Week 1): Pt will selective attention in a moderate distracting environment with min cuing OT Short Term Goal 4 (Week 1): shower stall transfer with min A with appropriate DME  Skilled Therapeutic Interventions/Progress Updates:    Pt declined bathing this morning but wanted to change clothes.  Pt amb without AD in room to gather clothing and dressed with sit<>stand from EOB. Pt donned pants with button-up fly and required assistance with buttons.  Pt completed grooming tasks standing at sink.  Pt amb with SPC to ADL apartment to perform home mgmt tasks before walking back to room.  Pt completed all tasks with supervision except buttoning pants.  Focus on safety awareness, dynamic standing balance, functional amb with and without AD, and activity tolerance.  Therapy Documentation Precautions:  Precautions Precautions: Fall Restrictions Weight Bearing Restrictions: No Pain: Pain Assessment Pain Assessment: No/denies pain  See FIM for current functional status  Therapy/Group: Individual Therapy  Session 2 Time: 7253-6644 Pt denies pain Individual Therapy  Pt engaged in self feeding task with focus on portion control and swallowing strategies.  Pt continues to require verbal cues to wipe mouth from anterior spillage.  Pt requires verbal cues to swallow food in mouth before taking additional bites.  Pt requires verbal cues to attend to task.  Pt requires extra time to complete task.   [name] [facility] 10/08/2012, 9:56 AM

## 2012-10-08 NOTE — Progress Notes (Signed)
[name], male   DOB: 03-08-1934, 79 y.o.   MRN: [id number] Subjective/Complaints: No new complaints.  Review of Systems  Neurological: Positive for headaches still.  All other systems reviewed and are negative.    Objective: Vital Signs: Blood pressure 124/74, pulse 66, temperature 98.4 F (36.9 C), temperature source Oral, resp. rate 18, height 6' (1.829 m), weight 81.4 kg (179 lb 7.3 oz), SpO2 99.00%. No results found. Results for orders placed during the hospital encounter of 09/30/12 (from the past 72 hour(s))  GLUCOSE, CAPILLARY     Status: Abnormal   Collection Time    10/05/12 11:49 AM      Result Value Range   Glucose-Capillary 108 (*) 70 - 99 mg/dL  GLUCOSE, CAPILLARY     Status: Abnormal   Collection Time    10/05/12  4:53 PM      Result Value Range   Glucose-Capillary 120 (*) 70 - 99 mg/dL  GLUCOSE, CAPILLARY     Status: Abnormal   Collection Time    10/05/12  9:55 PM      Result Value Range   Glucose-Capillary 143 (*) 70 - 99 mg/dL   Comment 1 Notify RN    GLUCOSE, CAPILLARY     Status: Abnormal   Collection Time    10/06/12  7:21 AM      Result Value Range   Glucose-Capillary 110 (*) 70 - 99 mg/dL   Comment 1 Notify RN    GLUCOSE, CAPILLARY     Status: Abnormal   Collection Time    10/06/12 11:20 AM      Result Value Range   Glucose-Capillary 156 (*) 70 - 99 mg/dL   Comment 1 Notify RN    GLUCOSE, CAPILLARY     Status: Abnormal   Collection Time    10/06/12  5:08 PM      Result Value Range   Glucose-Capillary 102 (*) 70 - 99 mg/dL  GLUCOSE, CAPILLARY     Status: Abnormal   Collection Time    10/06/12  8:52 PM      Result Value Range   Glucose-Capillary 149 (*) 70 - 99 mg/dL  GLUCOSE, CAPILLARY     Status: Abnormal   Collection Time    10/07/12  7:12 AM      Result Value Range   Glucose-Capillary 131 (*) 70 - 99 mg/dL   Comment 1 Notify RN    GLUCOSE, CAPILLARY     Status: Abnormal   Collection Time    10/07/12 11:05 AM   Result Value Range   Glucose-Capillary 130 (*) 70 - 99 mg/dL  GLUCOSE, CAPILLARY     Status: Abnormal   Collection Time    10/07/12  4:18 PM      Result Value Range   Glucose-Capillary 122 (*) 70 - 99 mg/dL  GLUCOSE, CAPILLARY     Status: Abnormal   Collection Time    10/07/12  8:53 PM      Result Value Range   Glucose-Capillary 106 (*) 70 - 99 mg/dL  GLUCOSE, CAPILLARY     Status: Abnormal   Collection Time    10/08/12  7:15 AM      Result Value Range   Glucose-Capillary 126 (*) 70 - 99 mg/dL   Comment 1 Notify RN       HEENT: normal and scalp incision intact- staples out Cardio: RRR and no murmurs Resp: CTA B/L and unlabored GI: BS positive and non tender Extremity:  Pulses  positive and No Edema Skin:   Wound C/D/I--staples in Neuro: Lethargic, Cranial Nerve II-XII normal,  FMC Ataxic/ dec FMC Musc/Skel:  Normal GEN: NAD 4/5 strength in bilateral deltoid, biceps, triceps, grip, hip flexor, knee extensors, ankle dorsiflexor plantar flexor   Demonstrates basic insight and awareness. Follows all commands. Attention perhaps a little better   Assessment/Plan: 1. Functional deficits secondary to R SDH which require 3+ hours per day of interdisciplinary therapy in a comprehensive inpatient rehab setting. Physiatrist is providing close team supervision and 24 hour management of active medical problems listed below. Physiatrist and rehab team continue to assess barriers to discharge/monitor patient progress toward functional and medical goals. FIM: FIM - Bathing Bathing Steps Patient Completed: Chest;Right Arm;Left Arm;Abdomen;Front perineal area;Buttocks;Left lower leg (including foot);Right lower leg (including foot);Left upper leg;Right upper leg Bathing: 5: Supervision: Safety issues/verbal cues  FIM - Upper Body Dressing/Undressing Upper body dressing/undressing steps patient completed: Thread/unthread right sleeve of pullover shirt/dresss;Thread/unthread left sleeve of  pullover shirt/dress;Pull shirt over trunk;Put head through opening of pull over shirt/dress Upper body dressing/undressing: 5: Supervision: Safety issues/verbal cues FIM - Lower Body Dressing/Undressing Lower body dressing/undressing steps patient completed: Thread/unthread right underwear leg;Thread/unthread left underwear leg;Pull underwear up/down;Thread/unthread right pants leg;Thread/unthread left pants leg;Pull pants up/down;Fasten/unfasten pants;Don/Doff right sock;Don/Doff left sock Lower body dressing/undressing: 5: Supervision: Safety issues/verbal cues  FIM - Toileting Toileting steps completed by patient: Adjust clothing prior to toileting;Performs perineal hygiene;Adjust clothing after toileting Toileting Assistive Devices: Grab bar or rail for support Toileting: 5: Supervision: Safety issues/verbal cues  FIM - Diplomatic Services operational officer Devices: Elevated toilet seat;Grab bars;Walker Toilet Transfers: 5-To toilet/BSC: Supervision (verbal cues/safety issues);5-From toilet/BSC: Supervision (verbal cues/safety issues)  FIM - Banker Devices: Bed rails Bed/Chair Transfer: 5: Chair or W/C > Bed: Supervision (verbal cues/safety issues);5: Bed > Chair or W/C: Supervision (verbal cues/safety issues)  FIM - Locomotion: Wheelchair Locomotion: Wheelchair: 0: Activity did not occur FIM - Locomotion: Ambulation Locomotion: Ambulation Assistive Devices: Designer, industrial/product Ambulation/Gait Assistance: 4: Min guard Locomotion: Ambulation: 5: Travels 150 ft or more with supervision/safety issues  Comprehension Comprehension Mode: Auditory Comprehension: 5-Understands complex 90% of the time/Cues < 10% of the time  Expression Expression Mode: Verbal Expression: 6-Expresses complex ideas: With extra time/assistive device  Social Interaction Social Interaction: 4-Interacts appropriately 75 - 89% of the time - Needs redirection for  appropriate language or to initiate interaction.  Problem Solving Problem Solving: 4-Solves basic 75 - 89% of the time/requires cueing 10 - 24% of the time  Memory Memory: 4-Recognizes or recalls 75 - 89% of the time/requires cueing 10 - 24% of the time Medical Problem List and Plan:  1. Right subdural hematoma. Status post craniotomy evacuation of hematoma 09/19/2012 and redo craniotomy 09/22/2012 for recurrent subdural hematoma  2. DVT Prophylaxis/Anticoagulation: SCDs. Monitor for any signs of DVT  3. Mood: Xanax 0.25 mg twice a day.    4. Dysphagia. Dysphagia 2 thin liquid diet. No aspiration signs   -intake good thus far 5. Neuropsych: This patient is not capable of making decisions on his/her own behalf.  6. Seizure prophylaxis. Keppra 500 mg twice a day  7. Hypertension. Tenormin 12.5 mg daily. Normotensive at present 8. Hypothyroidism. Synthroid  9. Non-insulin-dependent diabetes mellitus.  . Patient on Glucophage 500 mg bid currently (home dose)  -sugars under reasonable control 10. GERD. Protonix  11. Hyperlipidemia. Zocor  12. Wound: removed staples   LOS (Days) 8 A FACE TO FACE EVALUATION WAS PERFORMED  Veleta Yamamoto T 10/08/2012, 7:43 AM

## 2012-10-08 NOTE — Progress Notes (Signed)
Speech Language Pathology Daily Session Note  Patient Details  Name: QUILLAN WHITTER MRN: [id number] Date of Birth: 07/13/1933  Today's Date: 10/08/2012 Time: 0800-0830 Time Calculation (min): 30 min  Short Term Goals: Week 1: SLP Short Term Goal 1 (Week 1): Pt will demonstrate sustained attention to task for 30 minutes with supervision verbal cues for redirection  SLP Short Term Goal 2 (Week 1): Pt will utilize external memory aids to increase recall of new, daily information with Min A verbal and question cues.  SLP Short Term Goal 3 (Week 1): Pt will identify 2 cognitive deficits with Min A semantic and question cues.  SLP Short Term Goal 4 (Week 1): Pt will demonstrate functional problem solving for basic and familiar tasks with Min A verbal cues.  SLP Short Term Goal 5 (Week 1): Pt will utilize swallowing compensatory strategies with supervision verbal cues to minimize overt s/s of aspiration   Skilled Therapeutic Interventions: Treatment focus on cognitive goals. Upon entering the room, the pt's call light was on and he requested to use the bathroom. Pt declined the use of a cane for ambulation to the bathroom, however, no unsafe behavior was noted throughout the task.  Pt was overall Mod I for recall of new information in regards to d/c planning. Pt was also Mod I for tray set up and utilization of swallowing compensatory strategies.    FIM:  Comprehension Comprehension Mode: Auditory Comprehension: 5-Understands basic 90% of the time/requires cueing < 10% of the time Expression Expression Mode: Verbal Expression: 5-Expresses basic needs/ideas: With extra time/assistive device Social Interaction Social Interaction: 5-Interacts appropriately 90% of the time - Needs monitoring or encouragement for participation or interaction. Problem Solving Problem Solving: 4-Solves basic 75 - 89% of the time/requires cueing 10 - 24% of the time Memory Memory: 4-Recognizes or recalls 75 - 89% of  the time/requires cueing 10 - 24% of the time  Pain Pain Assessment Pain Assessment: No/denies pain  Therapy/Group: Individual Therapy  Delrick Dehart 10/08/2012, 2:09 PM

## 2012-10-08 NOTE — Progress Notes (Signed)
Physical Therapy Note  Patient Details  Name: Gerald Leach MRN: [id number] Date of Birth: 02-22-1934 Today's Date: 10/08/2012  Time: 1000-1030 30 minutes  No c/o pain.  Gait in controlled and home environments with mod I with SPC.  Pt made mod I in room for transfers, gait and bathroom mobility.  Balance training with sequencing task with pt able to recall correctly sequences of 3-4 numbers, able to tap without UE support with min steadying assist.  Pipe tree activity with min cues for problem solving and L attention.  Overall pt with improved balance reactions and increased independence with gait with SPC.  Individual therapy   Lillah Standre 10/08/2012, 10:26 AM

## 2012-10-08 NOTE — Progress Notes (Signed)
Social Work Patient ID: [name], male   DOB: 05/22/1934, 79 y.o.   MRN: [id number]  Met yesterday with pt's wife and two adult children.  Reviewed current care needs of pt - supervision and behavioral management.  Wife appears and admits she is very nervous about providing primary care to him at home.  All report that even PTA, pt could "get really ugly with me (wife)".  Very open, blunt discussion ensued about the fact that his behavior with her could very well be worse now with his cognitive deficits.   Reviewed options of SNF placement, Adult Day Care programs or privately hiring a caregiver to allow wife time away from home.  All were agreed that pt would likely refuse SNF and they do not see much benefit of that option as it is only temporary and just delays inevitable of his return home.  Wife to consider private caregiver.  Decides her plan is to take patient home and she will be primary caregiver with intermittent assist of local children.  She feels she could get him to and from OP therapy - will refer to [facility].  Have discussed wife's obvious apprehension about providing primary care to pt.  She plans to be here tomorrow morning to go through family education.  Will review safety plans with her at that time as well.  Isobelle Tuckett, LCSW

## 2012-10-08 NOTE — Patient Care Conference (Signed)
Inpatient RehabilitationTeam Conference and Plan of Care Update Date: 10/07/2012   Time: 3:00 PM    Patient Name: Gerald Leach      Medical Record Number: [id number]  Date of Birth: 28-Aug-1933 Sex: Male         Room/Bed: 4002/4002-01 Payor Info: Payor: Advertising copywriter MEDICARE  Plan: AARP MEDICARE COMPLETE  Product Type: *No Product type*     Admitting Diagnosis: R SDH  Admit Date/Time:  09/30/2012  4:48 PM Admission Comments: No comment available   Primary Diagnosis:  Nontraumatic subdural hemorrhage Principal Problem: Nontraumatic subdural hemorrhage  Patient Active Problem List   Diagnosis Date Noted  . Nontraumatic subdural hemorrhage 10/01/2012  . Dysphagia 09/21/2012  . Subdural hematoma 09/20/2012  . Respiratory failure, post-operative 09/20/2012  . S/P craniotomy 09/20/2012  . Facial laceration 02/25/2012  . Contusion, chest wall 02/25/2012  . Diabetes mellitus type II, uncontrolled 06/27/2011  . SOLAR KERATOSIS 02/17/2009  . CARCINOMA, SKIN, SQUAMOUS CELL, FACE 04/19/2008  . CARCINOMA, SKIN, SQUAMOUS CELL, FACE 04/19/2008  . HYPOTHYROIDISM 01/27/2008  . HYPERLIPIDEMIA 01/27/2008  . COLONIC POLYPS 11/15/2006  . HYPERTENSION 11/15/2006  . GERD 11/15/2006    Expected Discharge Date: Expected Discharge Date: 10/10/12  Team Members Present: Physician leading conference: Dr. Faith Rogue Nurse Present: Rosalio Macadamia, RN PT Present: Reggy Eye, PT OT Present: Ardis Rowan, Corky Crafts, OT;Jennifer Kulm, OT SLP Present: Feliberto Gottron, SLP Other (Discipline and Name): Tora Duck, PPS Coordinator     Current Status/Progress Goal Weekly Team Focus  Medical   improving neurologically, still cognitive deficits, wound clean  improve sleep wake cycle and cognition  family ed, stabilize medically for dc---changed therapies to QD   Bowel/Bladder   Incontinenet  of bowel and bladder/ wear condom cath at night  cont of bowel and bladder  cont of bowel and bladder    Swallow/Nutrition/ Hydration   Dys. 2 textures with thin liquids, supervision verbal cues for utilization of compensatory strategies  Supervision  trials of Dys. 3 textures   ADL's   supervision overall  supervision overall  family education   Mobility   supervision  supervision  family ed   Communication             Safety/Cognition/ Behavioral Observations  Min A  Supervision  problem solving, safety awareness, attention    Pain   denied pain   less or equal to 3  less or equal to 3   Skin   rash inside groin /botton red  free of skin breakdown  monitor for skin breakdown    Rehab Goals Patient on target to meet rehab goals: Yes *See Care Plan and progress notes for long and short-term goals.  Barriers to Discharge: cognition    Possible Resolutions to Barriers:  supervision    Discharge Planning/Teaching Needs:  Home with wife to provide 24/7 supervision.  May consider hiring private duty caregiver.      Team Discussion:  Meeting supervision goals, however, still need to complete family education.  SW has confirmed d/c plan to have pt return home with wife - family very concerned about pt's behavior at home with her.  Will provide safety tips for home.  Revisions to Treatment Plan:  Decrease therapy to qd   Continued Need for Acute Rehabilitation Level of Care: The patient requires daily medical management by a physician with specialized training in physical medicine and rehabilitation for the following conditions: Daily direction of a multidisciplinary physical rehabilitation program to ensure safe treatment while  eliciting the highest outcome that is of practical value to the patient.: Yes Daily medical management of patient stability for increased activity during participation in an intensive rehabilitation regime.: Yes Daily analysis of laboratory values and/or radiology reports with any subsequent need for medication adjustment of medical intervention for :  Neurological problems;Post surgical problems;Other  Mecca Guitron 10/08/2012, 8:03 AM

## 2012-10-09 ENCOUNTER — Encounter (HOSPITAL_COMMUNITY): Payer: Medicare Other | Admitting: Occupational Therapy

## 2012-10-09 ENCOUNTER — Inpatient Hospital Stay (HOSPITAL_COMMUNITY): Payer: Medicare Other | Admitting: Speech Pathology

## 2012-10-09 ENCOUNTER — Inpatient Hospital Stay (HOSPITAL_COMMUNITY): Payer: Medicare Other

## 2012-10-09 ENCOUNTER — Inpatient Hospital Stay (HOSPITAL_COMMUNITY): Payer: Medicare Other | Admitting: Physical Therapy

## 2012-10-09 LAB — GLUCOSE, CAPILLARY
Glucose-Capillary: 119 mg/dL — ABNORMAL HIGH (ref 70–99)
Glucose-Capillary: 212 mg/dL — ABNORMAL HIGH (ref 70–99)
Glucose-Capillary: 105 mg/dL — ABNORMAL HIGH (ref 70–99)

## 2012-10-09 NOTE — Progress Notes (Signed)
Occupational Therapy Daily Note and Discharge Summary  Patient Details  Name: Gerald Leach MRN: [id number] Date of Birth: 07/14/1933  Today's Date: 10/09/2012 Time: 0900-1000 Time Calculation (min): 60 min  1:1 Grad day. Self care retraining at shower level in ADL apartment with focus on family education with pt's wife. Education on importance of 24 hr supervision and an established routine, therapeutic activities to participate in at home, safety with cane, stepping over the tub without use of grab bar, community mobility with rest breaks, no driving or financial management, memory and continued therapy session. Wife did needed sitting rest breaks throughout session.  Patient has met 13 of 13 long term goals due to improved activity tolerance, improved balance, postural control, ability to compensate for deficits, functional use of  LEFT upper and LEFT lower extremity, improved attention, improved awareness and improved coordination.  Patient to discharge at overall Supervision level for all functional ADLs.  Patient's care partner is independent to provide the necessary physical and cognitive assistance at discharge.    Reasons goals not met: n/a  Recommendation:  Patient will benefit from ongoing skilled OT services in home health setting to continue to advance functional skills in the area of BADL and iADL.  Equipment: No equipment provided  Reasons for discharge: treatment goals met and discharge from hospital  Patient/family agrees with progress made and goals achieved: Yes  OT Discharge Precautions/Restrictions  Precautions Precautions: Fall Precaution Comments: decreased safety Restrictions Weight Bearing Restrictions: No Pain Pain Assessment Pain Assessment: No/denies pain Pain Score: 0-No pain PAINAD (Pain Assessment in Advanced Dementia) Breathing: normal ADL  see FIM Vision/Perception  Vision - History Baseline Vision: Wears glasses only for reading Visual  History: Cataracts Patient Visual Report: No change from baseline Vision - Assessment Vision Assessment: Vision tested Ocular Range of Motion: Within Functional Limits Alignment/Gaze Preference: Within Defined Limits Tracking/Visual Pursuits: Able to track stimulus in all quads without difficulty Saccades: Within functional limits Perception Inattention/Neglect:  (improved attention to the left) Praxis Praxis: Intact  Cognition Overall Cognitive Status: Impaired/Different from baseline Arousal/Alertness: Awake/alert Orientation Level: Oriented X4 Attention: Selective Sustained Attention: Appears intact Selective Attention: Appears intact Memory: Impaired Memory Impairment: Decreased recall of new information;Decreased short term memory Awareness: Impaired Awareness Impairment: Anticipatory impairment Problem Solving: Impaired Problem Solving Impairment: Functional complex Executive Function: Reasoning Reasoning: Impaired Reasoning Impairment: Functional basic Initiating: Impaired Initiating Impairment: Functional complex Behaviors: Impulsive Safety/Judgment: Impaired Ranch[place]ant Scales of Cognitive Functioning: Purposeful/appropriate Sensation Sensation Light Touch Impaired Details: Impaired LUE Proprioception Impaired Details: Impaired LUE Coordination Coordination and Movement Description: FMC improved in left hand with increased attention to it- able to accomodate with increaed time  Motor  Motor Motor - Discharge Observations: improved posture and functional mobility Mobility  Transfers Sit to Stand: 6: Modified independent (Device/Increase time) Stand to Sit: 6: Modified independent (Device/Increase time)  Trunk/Postural Assessment  Cervical Assessment Cervical Assessment: Within Functional Limits Thoracic Assessment Thoracic Assessment: Within Functional Limits Lumbar Assessment Lumbar Assessment: Within Functional Limits Postural Control Righting  Reactions: improved  Balance Static Standing Balance Static Standing - Level of Assistance: 6: Modified independent (Device/Increase time) Dynamic Standing Balance Dynamic Standing - Level of Assistance: 6: Modified independent (Device/Increase time) Extremity/Trunk Assessment RUE Assessment RUE Assessment: Within Functional Limits LUE Assessment LUE Assessment: Exceptions to [facility] (increased [facility] - but still impaired 3/5)  See FIM for current functional status  Roney Mans [facility] 10/09/2012, 12:22 PM

## 2012-10-09 NOTE — Progress Notes (Signed)
[name], male   DOB: 11/29/1933, 79 y.o.   MRN: [id number] Subjective/Complaints: No new complaints today. Sleeping well Review of Systems  Neurological: Positive for headaches still.  All other systems reviewed and are negative.    Objective: Vital Signs: Blood pressure 140/77, pulse 70, temperature 98.2 F (36.8 C), temperature source Oral, resp. rate 18, height 6' (1.829 m), weight 75.388 kg (166 lb 3.2 oz), SpO2 98.00%. No results found. Results for orders placed during the hospital encounter of 09/30/12 (from the past 72 hour(s))  GLUCOSE, CAPILLARY     Status: Abnormal   Collection Time    10/06/12 11:20 AM      Result Value Range   Glucose-Capillary 156 (*) 70 - 99 mg/dL   Comment 1 Notify RN    GLUCOSE, CAPILLARY     Status: Abnormal   Collection Time    10/06/12  5:08 PM      Result Value Range   Glucose-Capillary 102 (*) 70 - 99 mg/dL  GLUCOSE, CAPILLARY     Status: Abnormal   Collection Time    10/06/12  8:52 PM      Result Value Range   Glucose-Capillary 149 (*) 70 - 99 mg/dL  GLUCOSE, CAPILLARY     Status: Abnormal   Collection Time    10/07/12  7:12 AM      Result Value Range   Glucose-Capillary 131 (*) 70 - 99 mg/dL   Comment 1 Notify RN    GLUCOSE, CAPILLARY     Status: Abnormal   Collection Time    10/07/12 11:05 AM      Result Value Range   Glucose-Capillary 130 (*) 70 - 99 mg/dL  GLUCOSE, CAPILLARY     Status: Abnormal   Collection Time    10/07/12  4:18 PM      Result Value Range   Glucose-Capillary 122 (*) 70 - 99 mg/dL  GLUCOSE, CAPILLARY     Status: Abnormal   Collection Time    10/07/12  8:53 PM      Result Value Range   Glucose-Capillary 106 (*) 70 - 99 mg/dL  GLUCOSE, CAPILLARY     Status: Abnormal   Collection Time    10/08/12  7:15 AM      Result Value Range   Glucose-Capillary 126 (*) 70 - 99 mg/dL   Comment 1 Notify RN    GLUCOSE, CAPILLARY     Status: Abnormal   Collection Time    10/08/12 11:26 AM      Result  Value Range   Glucose-Capillary 122 (*) 70 - 99 mg/dL   Comment 1 Notify RN    GLUCOSE, CAPILLARY     Status: Abnormal   Collection Time    10/08/12  4:41 PM      Result Value Range   Glucose-Capillary 139 (*) 70 - 99 mg/dL   Comment 1 Notify RN    GLUCOSE, CAPILLARY     Status: Abnormal   Collection Time    10/08/12  6:17 PM      Result Value Range   Glucose-Capillary 195 (*) 70 - 99 mg/dL  GLUCOSE, CAPILLARY     Status: Abnormal   Collection Time    10/08/12  8:30 PM      Result Value Range   Glucose-Capillary 120 (*) 70 - 99 mg/dL  GLUCOSE, CAPILLARY     Status: Abnormal   Collection Time    10/09/12  7:20 AM  Result Value Range   Glucose-Capillary 119 (*) 70 - 99 mg/dL   Comment 1 Notify RN       HEENT: normal and scalp incision intact- staples out Cardio: RRR and no murmurs Resp: CTA B/L and unlabored GI: BS positive and non tender Extremity:  Pulses positive and No Edema Skin:   Wound C/D/I--staples in Neuro: Lethargic, Cranial Nerve II-XII normal,  FMC Ataxic/ dec FMC Musc/Skel:  Normal GEN: NAD 4/5 strength in bilateral deltoid, biceps, triceps, grip, hip flexor, knee extensors, ankle dorsiflexor plantar flexor   Demonstrates basic insight and awareness. Follows all commands. Attention perhaps a little better   Assessment/Plan: 1. Functional deficits secondary to R SDH which require 3+ hours per day of interdisciplinary therapy in a comprehensive inpatient rehab setting. Physiatrist is providing close team supervision and 24 hour management of active medical problems listed below. Physiatrist and rehab team continue to assess barriers to discharge/monitor patient progress toward functional and medical goals.  Family ed, dc planning FIM: FIM - Bathing Bathing Steps Patient Completed: Chest;Right Arm;Left Arm;Abdomen;Front perineal area;Buttocks;Left lower leg (including foot);Right lower leg (including foot);Left upper leg;Right upper leg Bathing: 5:  Supervision: Safety issues/verbal cues  FIM - Upper Body Dressing/Undressing Upper body dressing/undressing steps patient completed: Thread/unthread right sleeve of pullover shirt/dresss;Thread/unthread left sleeve of pullover shirt/dress;Put head through opening of pull over shirt/dress;Pull shirt over trunk Upper body dressing/undressing: 6: More than reasonable amount of time FIM - Lower Body Dressing/Undressing Lower body dressing/undressing steps patient completed: Thread/unthread right underwear leg;Thread/unthread left underwear leg;Pull underwear up/down;Thread/unthread right pants leg;Don/Doff right sock;Fasten/unfasten pants;Pull pants up/down;Thread/unthread left pants leg;Don/Doff left sock;Don/Doff right shoe;Don/Doff left shoe;Fasten/unfasten right shoe;Fasten/unfasten left shoe Lower body dressing/undressing: 5: Supervision: Safety issues/verbal cues  FIM - Toileting Toileting steps completed by patient: Adjust clothing prior to toileting;Performs perineal hygiene;Adjust clothing after toileting Toileting Assistive Devices: Grab bar or rail for support Toileting: 5: Supervision: Safety issues/verbal cues  FIM - Diplomatic Services operational officer Devices: Elevated toilet seat;Grab bars;Walker Toilet Transfers: 5-To toilet/BSC: Supervision (verbal cues/safety issues);5-From toilet/BSC: Supervision (verbal cues/safety issues)  FIM - Banker Devices: Bed rails Bed/Chair Transfer: 7: Supine > Sit: No assist;7: Sit > Supine: No assist;6: Bed > Chair or W/C: No assist;6: Chair or W/C > Bed: No assist  FIM - Locomotion: Wheelchair Locomotion: Wheelchair: 0: Activity did not occur FIM - Locomotion: Ambulation Locomotion: Ambulation Assistive Devices: Designer, industrial/product Ambulation/Gait Assistance: 4: Min guard Locomotion: Ambulation: 6: Travels 150 ft or more with assistive device/no helper  Comprehension Comprehension Mode:  Auditory Comprehension: 3-Understands basic 50 - 74% of the time/requires cueing 25 - 50%  of the time  Expression Expression Mode: Verbal Expression: 2-Expresses basic 25 - 49% of the time/requires cueing 50 - 75% of the time. Uses single words/gestures.  Social Interaction Social Interaction: 3-Interacts appropriately 50 - 74% of the time - May be physically or verbally inappropriate.  Problem Solving Problem Solving: 2-Solves basic 25 - 49% of the time - needs direction more than half the time to initiate, plan or complete simple activities  Memory Memory: 2-Recognizes or recalls 25 - 49% of the time/requires cueing 51 - 75% of the time Medical Problem List and Plan:  1. Right subdural hematoma. Status post craniotomy evacuation of hematoma 09/19/2012 and redo craniotomy 09/22/2012 for recurrent subdural hematoma  2. DVT Prophylaxis/Anticoagulation: SCDs. Monitor for any signs of DVT  3. Mood: Xanax 0.25 mg twice a day.    4. Dysphagia. Dysphagia 2 thin  liquid diet. No aspiration signs   -intake good 5. Neuropsych: This patient is not capable of making decisions on his/her own behalf.  6. Seizure prophylaxis. Keppra 500 mg twice a day  7. Hypertension. Tenormin 12.5 mg daily. Normotensive at present 8. Hypothyroidism. Synthroid  9. Non-insulin-dependent diabetes mellitus.  . Patient on Glucophage 500 mg bid currently (home dose)  -sugars under reasonable control 10. GERD. Protonix  11. Hyperlipidemia. Zocor  12. Wound: removed staples   LOS (Days) 9 A FACE TO FACE EVALUATION WAS PERFORMED  SWARTZ,ZACHARY T 10/09/2012, 8:09 AM

## 2012-10-09 NOTE — Progress Notes (Signed)
Social Work  Discharge Note  The overall goal for the admission was met for:   Discharge location: Yes - home with wife to provide 24/7 supervision  Length of Stay: Yes  Discharge activity level: Yes  Home/community participation: Yes  Services provided included: MD, RD, PT, OT, SLP, RN, TR, Pharmacy, Neuropsych and SW  Financial Services: Medicare  Follow-up services arranged: Outpatient: PT, OT, ST via [facility] and Patient/Family has no preference for HH/DME agencies  Comments (or additional information):  Patient/Family verbalized understanding of follow-up arrangements: Yes  Individual responsible for coordination of the follow-up plan: wife  Confirmed correct DME delivered: NA    Calista Crain

## 2012-10-09 NOTE — Progress Notes (Signed)
Physical Therapy Note  Patient Details  Name: Gerald Leach MRN: [id number] Date of Birth: [birth year] Today's Date: 10/09/2012  Time: 1000-1026 26 minutes  No c/o pain.  Treatment focused on pt/family education with pt's wife.  Pt/wife safely demo'd stairs, gait with SPC in home and controlled environment, car transfers, basic transfers with wife providing supervision level assist.  Discussed with pt's wife need for 24 hour supervision due to decreased safety awareness, need for supervision with gait on stairs and in community settings and outdoors, recommendation for follow up outpatient PT services.  Wife/pt express understanding.  Pt/wife state they feel ok with d/c home at this level of care.  Individual therapy   Chaunda Vandergriff 10/09/2012, 10:26 AM

## 2012-10-09 NOTE — Progress Notes (Signed)
Physical Therapy Discharge Summary  Patient Details  Name: Gerald Leach MRN: [id number] Date of Birth: 08-08-1933  Today's Date: 10/09/2012 Time: 1030-1055 Time Calculation (min): 25 min  Patient has met 11 of 11 long term goals due to improved activity tolerance, improved balance, improved postural control, increased strength, ability to compensate for deficits, improved attention, improved awareness and improved coordination.  Patient to discharge at an ambulatory level Modified Independent.   Patient's care partner is independent to provide the necessary cognitive assistance at discharge.  Reasons goals not met: n/a  Recommendation:  Patient will benefit from ongoing skilled PT services in outpatient setting to continue to advance safe functional mobility, address ongoing impairments in balance, gait, and minimize fall risk.  Equipment: No equipment provided  Reasons for discharge: treatment goals met and discharge from hospital  Patient/family agrees with progress made and goals achieved: Yes  PT Discharge Cognition Overall Cognitive Status: Impaired/Different from baseline Arousal/Alertness: Awake/alert Orientation Level: Oriented X4 Attention: Alternating Focused Attention: Appears intact Sustained Attention: Appears intact Selective Attention: Appears intact Alternating Attention: Appears intact Memory: Impaired Memory Impairment: Decreased recall of new information;Decreased short term memory Decreased Short Term Memory: Verbal basic;Functional basic Awareness: Impaired Awareness Impairment: Anticipatory impairment Problem Solving: Impaired Problem Solving Impairment: Functional complex Executive Function: Reasoning Reasoning: Impaired Reasoning Impairment: Functional complex;Verbal complex Sequencing: Appears intact Organizing: Appears intact Decision Making: Impaired Decision Making Impairment: Verbal basic;Functional basic Initiating: Impaired Initiating  Impairment: Functional complex Self Monitoring: Appears intact Self Correcting: Appears intact Behaviors: Impulsive Safety/Judgment: Impaired Rancho Mirant Scales of Cognitive Functioning: Purposeful/appropriate Sensation Sensation Light Touch Impaired Details: Impaired LUE Proprioception Impaired Details: Impaired LUE Coordination Gross Motor Movements are Fluid and Coordinated: Yes Motor  Motor Motor - Discharge Observations: improved posture    Trunk/Postural Assessment  Cervical Assessment Cervical Assessment: Within Functional Limits Thoracic Assessment Thoracic Assessment: Within Functional Limits Lumbar Assessment Lumbar Assessment: Within Functional Limits Postural Control Righting Reactions: improved  Balance Static Sitting Balance Static Sitting - Level of Assistance: 7: Independent Static Standing Balance Static Standing - Level of Assistance: 6: Modified independent (Device/Increase time) Dynamic Standing Balance Dynamic Standing - Level of Assistance: 6: Modified independent (Device/Increase time) Extremity Assessment      RLE Assessment RLE Assessment: Within Functional Limits LLE Assessment LLE Assessment: Within Functional Limits  See FIM for current functional status  Gerald Leach 10/09/2012, 4:26 PM

## 2012-10-09 NOTE — Progress Notes (Signed)
Occupational Therapy Session Note  Patient Details  Name: NATANAEL SALADIN MRN: [id number] Date of Birth: 10/19/1933  Today's Date: 10/09/2012 Time: 1200-1215 Time Calculation (min): 15 min   Skilled Therapeutic Interventions/Progress Updates: Patient participated in [employer] today to address his ability to safely feed himself, using bilateral upper extremities.  Patient was able to quietly feed himself, using bilateral hands.  Patient did respond when spoken to with brief verbal responses, but initiated little conversation, stating, "I am a quiet man."      Therapy Documentation Precautions:  Precautions Precautions: Fall Precaution Comments: decreased safety Restrictions Weight Bearing Restrictions: No   Pain: Pain Assessment Pain Assessment: No/denies pain Pain Score: 0-No pain PAINAD (Pain Assessment in Advanced Dementia) Breathing: normal    See FIM for current functional status  Therapy/Group: Group Therapy  Collier [name] 10/09/2012, 1:06 PM

## 2012-10-09 NOTE — Discharge Summary (Signed)
  Discharge summary job # [id number]

## 2012-10-09 NOTE — Progress Notes (Signed)
Speech Language Pathology Session Notes & Discharge Summary  Patient Details  Name: Gerald Leach MRN: [id number] Date of Birth: December 31, 1933  Today's Date: 10/09/2012  Session 1 Time: 1030-1055 Time Calculation (min): 25 min  Session 2 Time: 1130-1200 Time Calculation: 30 min  Skilled Therapeutic Intervention:   Session 1: Treatment focus on family education with pt's wife and daughter in regards to pt's current cognitive and swallowing function. Pt's family given a handout on appropriate textures for current diet and current swallowing compensatory strategies to decrease aspiration risk. They were also given a handout for memory compensatory strategies and strategies to utilize at home to increase initiation, problem solving and safety awareness. The pt and his family verbalized understanding of all information presented and verbalized no questions at this time.   Session 2: Pt participated in co-treatment with OT in diners club with treatment focus on dysphagia goals. Pt consumed Dys. 3 textures with thin liquids without overt s/s of aspiration and required intermittent supervision verbal cues for small sips of thin liquids via cup. Pt with little social interaction with other group members.   Patient has met 6 of 7 long term goals.  Patient to discharge at overall Supervision level.   Reasons goals not met: Pt requires Mod A for complex problem solving tasks   Clinical Impression/Discharge Summary: Pt has made functional gains and has met 6 of 7 LTG's this admission. Currently, pt requires supervision for recall of new information, initiation, functional problem solving with basic and familiar tasks, alternating attention and anticipatory awareness. Pt continues to require Mod A verbal cues for complex problem solving tasks. Pt is also consuming Dys. 3 textures with thin liquids and is overall Mod I for utilization of swallowing compensatory strategies. Pt/family education complete and pt  would benefit from continued skilled SLP intervention at an outpatient setting to maximize swallowing function, cognitive function and overall functional independence.   Care Partner:  Caregiver Able to Provide Assistance: Yes  Type of Caregiver Assistance: Cognitive  Recommendation:  24 hour supervision/assistance;Outpatient SLP  Rationale for SLP Follow Up: Maximize cognitive function and independence;Maximize swallowing safety;Reduce caregiver burden   Equipment: N/A   Reasons for discharge: Discharged from hospital;Treatment goals met   Patient/Family Agrees with Progress Made and Goals Achieved: Yes   See FIM for current functional status  Karmyn Lowman 10/09/2012, 4:26 PM

## 2012-10-09 NOTE — Discharge Summary (Signed)
Gerald Leach, Gerald Leach NO.:  [id number]  MEDICAL RECORD NO.:  [id number]  LOCATION:  4002                         FACILITY:  [facility]  PHYSICIAN:  Ranelle Oyster, M.D.DATE OF BIRTH:  April 28, 1934  DATE OF ADMISSION:  09/30/2012 DATE OF DISCHARGE:  10/10/2012                              DISCHARGE SUMMARY   DISCHARGE DIAGNOSES:  Right subdural hematoma with craniotomy and redo craniotomy September 22, 2012.  Sequential compression devices for DVT prophylaxis, dysphagia, seizure prophylaxis, hypertension, hypothyroidism, non-insulin-dependent diabetes mellitus, gastroesophageal reflux disease, hyperlipidemia.  HISTORY OF PRESENT ILLNESS:  This is a 79 year old right-handed male with history of brain aneurysm that required surgery [facility] in 2002.  Admitted September 19, 2012 with altered mental status and vomiting x24 hours.  CT scan and imaging revealed subdural hematoma. Underwent right craniotomy hematoma evacuation September 19, 2012 per Dr. Phoebe Leach.  The patient with increased lethargy September 22, 2012 with followup cranial CT scan showing recurrent bleeding in the right subdural space and returned to the operating room for redo craniotomy for recurrent subdural hematoma September 22, 2012.  Critical Care Medicine consulted postoperatively for acute care respiratory issues and extubated for September 24, 2012.  Maintained on Keppra for seizure prophylaxis.  A nasogastric tube was in place for nutritional support and diet slowly advanced.  Physical and occupational therapy ongoing. The patient was admitted for a comprehensive rehab program.  PAST MEDICAL HISTORY:  See discharge diagnoses.  SOCIAL HISTORY:  Lives with spouse.  Functional history prior to admission was independent, part-time employed as a [employer] manager.  Functional status upon admission to rehab services was minimal assist for sit to stand, minimal assist to ambulate 100 feet with a  rolling walker.  PHYSICAL EXAMINATION:  VITAL SIGNS:  Blood pressure 143/82, pulse 69, temperature 97.8, respirations 18. GENERAL:  This was an alert male. HEENT:  Pupils were round and reactive to light.  Craniotomy site healing. LUNGS:  Clear to auscultation. CARDIAC:  Regular rate and rhythm. ABDOMEN:  Soft, nontender.  Good bowel sounds. NEUROLOGIC:  He was oriented to person and hospital, needed some cues for time and situation.  REHABILITATION HOSPITAL COURSE:  The patient was admitted to inpatient rehab services with therapies initiated on a 3-hour daily basis consisting of physical therapy, occupational therapy, speech therapy, and rehabilitation nursing.  The following issues were addressed during the patient's rehabilitation stay.  Pertaining to Gerald Leach right subdural hematoma, he had undergone craniotomy with redo craniotomy for recurrent subdural hematoma September 22, 2012 with surgical site healing nicely and would follow up with Dr. Phoebe Leach.  Sequential compression devices in place for DVT prophylaxis.  He remained on Keppra for seizure prophylaxis with no seizure activity noted.  Blood pressures controlled on low-dose Tenormin with no chest pain or shortness of breath.  History of non-insulin dependent diabetes mellitus as he remained on Glucophage and blood sugars well controlled.  He continued on Zocor for hyperlipidemia.  The patient received weekly collaborative interdisciplinary team conferences to discuss estimated length of stay, family teaching, and any barriers to his discharge.  He had occasional incontinence of bladder, wearing a condom catheter at night.  His diet  had been advanced to a mechanical soft, which he was tolerating well. He required supervision overall for activities of daily living as well as functional mobility needing some cues for safety at which full family teaching was completed with his wife who could provide the necessary supervision at  home, although family remain very concerned about the patient's safety and this was discussed at length.  Ongoing therapies would be arranged as per rehab services.  DISCHARGE MEDICATIONS: 1. Tenormin 12.5 mg p.o. daily. 2. Keppra 500 mg p.o. b.i.d. 3. Synthroid 50 mcg p.o. daily. 4. Glucophage 500 mg p.o. b.i.d. 5. Multivitamin daily. 6. Protonix 40 mg daily. 7. Zocor 10 mg daily.  DIET:  Mechanical soft with diabetic restrictions.  He would follow up Dr. Faith Leach at the outpatient rehab service office Oct 29, 2012, Dr. Kelle Leach, medical management Oct 20, 2012, Dr. Colon Leach, Neurosurgery 1 month, call for appointment. Outpatient therapies had been arranged.     Gerald Leach, P.A.   ______________________________ Ranelle Oyster, M.D.    DA/MEDQ  D:  10/09/2012  T:  10/09/2012  Job:  [id number]  cc:   Gerald Gens A. [name], MD Ranelle Oyster, M.D. Gerald Leach, M.D.

## 2012-10-10 MED ORDER — METFORMIN HCL 500 MG PO TABS: 500.0000 mg | ORAL_TABLET | Freq: Two times a day (BID) | ORAL | Status: DC

## 2012-10-10 MED ORDER — SIMVASTATIN 10 MG PO TABS: 10.0000 mg | ORAL_TABLET | Freq: Every day | ORAL | Status: DC

## 2012-10-10 MED ORDER — OMEPRAZOLE 20 MG PO CPDR: 20.0000 mg | DELAYED_RELEASE_CAPSULE | ORAL | Status: DC

## 2012-10-10 MED ORDER — LEVOTHYROXINE SODIUM 50 MCG PO TABS: 50.0000 ug | ORAL_TABLET | Freq: Every day | ORAL | Status: DC

## 2012-10-10 MED ORDER — ATENOLOL 12.5 MG HALF TABLET: 12.5000 mg | ORAL_TABLET | Freq: Every day | ORAL | Status: DC

## 2012-10-10 MED ORDER — ADULT MULTIVITAMIN W/MINERALS CH: 1.0000 | ORAL_TABLET | Freq: Every day | ORAL | Status: DC

## 2012-10-10 MED ORDER — LEVETIRACETAM 500 MG PO TABS: 500.0000 mg | ORAL_TABLET | Freq: Two times a day (BID) | ORAL | Status: DC

## 2012-10-10 NOTE — Progress Notes (Signed)
[name], male   DOB: March 03, 1934, 79 y.o.   MRN: [id number] Subjective/Complaints: Headaches getting better. No complaints. Review of Systems  Neurological:  .  All other systems reviewed and are negative.    Objective: Vital Signs: Blood pressure 162/97, pulse 75, temperature 97.8 F (36.6 C), temperature source Oral, resp. rate 17, height 6' (1.829 m), weight 75.388 kg (166 lb 3.2 oz), SpO2 95.00%. No results found. Results for orders placed during the hospital encounter of 09/30/12 (from the past 72 hour(s))  GLUCOSE, CAPILLARY     Status: Abnormal   Collection Time    10/07/12 11:05 AM      Result Value Range   Glucose-Capillary 130 (*) 70 - 99 mg/dL  GLUCOSE, CAPILLARY     Status: Abnormal   Collection Time    10/07/12  4:18 PM      Result Value Range   Glucose-Capillary 122 (*) 70 - 99 mg/dL  GLUCOSE, CAPILLARY     Status: Abnormal   Collection Time    10/07/12  8:53 PM      Result Value Range   Glucose-Capillary 106 (*) 70 - 99 mg/dL  GLUCOSE, CAPILLARY     Status: Abnormal   Collection Time    10/08/12  7:15 AM      Result Value Range   Glucose-Capillary 126 (*) 70 - 99 mg/dL   Comment 1 Notify RN    GLUCOSE, CAPILLARY     Status: Abnormal   Collection Time    10/08/12 11:26 AM      Result Value Range   Glucose-Capillary 122 (*) 70 - 99 mg/dL   Comment 1 Notify RN    GLUCOSE, CAPILLARY     Status: Abnormal   Collection Time    10/08/12  4:41 PM      Result Value Range   Glucose-Capillary 139 (*) 70 - 99 mg/dL   Comment 1 Notify RN    GLUCOSE, CAPILLARY     Status: Abnormal   Collection Time    10/08/12  6:17 PM      Result Value Range   Glucose-Capillary 195 (*) 70 - 99 mg/dL  GLUCOSE, CAPILLARY     Status: Abnormal   Collection Time    10/08/12  8:30 PM      Result Value Range   Glucose-Capillary 120 (*) 70 - 99 mg/dL  GLUCOSE, CAPILLARY     Status: Abnormal   Collection Time    10/09/12  7:20 AM      Result Value Range   Glucose-Capillary 119 (*) 70 - 99 mg/dL   Comment 1 Notify RN    GLUCOSE, CAPILLARY     Status: Abnormal   Collection Time    10/09/12 11:05 AM      Result Value Range   Glucose-Capillary 212 (*) 70 - 99 mg/dL   Comment 1 Notify RN    GLUCOSE, CAPILLARY     Status: Abnormal   Collection Time    10/09/12  4:35 PM      Result Value Range   Glucose-Capillary 108 (*) 70 - 99 mg/dL   Comment 1 Notify RN    GLUCOSE, CAPILLARY     Status: Abnormal   Collection Time    10/09/12  9:14 PM      Result Value Range   Glucose-Capillary 105 (*) 70 - 99 mg/dL     HEENT: normal and scalp incision intact- staples out Cardio: RRR and no murmurs Resp: CTA B/L and  unlabored GI: BS positive and non tender Extremity:  Pulses positive and No Edema Skin:   Wound C/D/I--staples in Neuro: Lethargic, Cranial Nerve II-XII normal,  FMC Ataxic/ dec FMC Musc/Skel:  Normal GEN: NAD 4/5 strength in bilateral deltoid, biceps, triceps, grip, hip flexor, knee extensors, ankle dorsiflexor plantar flexor   Demonstrates basic insight and awareness. Follows all commands. Attention perhaps a little better   Assessment/Plan: 1. Functional deficits secondary to R SDH which require 3+ hours per day of interdisciplinary therapy in a comprehensive inpatient rehab setting. Physiatrist is providing close team supervision and 24 hour management of active medical problems listed below. Physiatrist and rehab team continue to assess barriers to discharge/monitor patient progress toward functional and medical goals.  Family ed, dc planning for today FIM: FIM - Bathing Bathing Steps Patient Completed: Chest;Right Arm;Left Arm;Abdomen;Front perineal area;Buttocks;Left lower leg (including foot);Right lower leg (including foot);Left upper leg;Right upper leg Bathing: 5: Supervision: Safety issues/verbal cues  FIM - Upper Body Dressing/Undressing Upper body dressing/undressing steps patient completed: Thread/unthread right  sleeve of pullover shirt/dresss;Thread/unthread left sleeve of pullover shirt/dress;Put head through opening of pull over shirt/dress;Pull shirt over trunk Upper body dressing/undressing: 6: More than reasonable amount of time FIM - Lower Body Dressing/Undressing Lower body dressing/undressing steps patient completed: Thread/unthread right underwear leg;Thread/unthread left underwear leg;Pull underwear up/down;Thread/unthread right pants leg;Don/Doff right sock;Fasten/unfasten pants;Pull pants up/down;Thread/unthread left pants leg;Don/Doff left sock;Don/Doff right shoe;Don/Doff left shoe;Fasten/unfasten right shoe;Fasten/unfasten left shoe Lower body dressing/undressing: 5: Supervision: Safety issues/verbal cues  FIM - Toileting Toileting steps completed by patient: Adjust clothing prior to toileting;Performs perineal hygiene;Adjust clothing after toileting Toileting Assistive Devices: Grab bar or rail for support Toileting: 5: Supervision: Safety issues/verbal cues  FIM - Diplomatic Services operational officer Devices: Elevated toilet seat;Grab bars;Walker Toilet Transfers: 6-More than reasonable amt of time  FIM - Banker Devices: Bed rails Bed/Chair Transfer: 7: Supine > Sit: No assist;7: Sit > Supine: No assist;6: Chair or W/C > Bed: No assist;6: Bed > Chair or W/C: No assist  FIM - Locomotion: Wheelchair Locomotion: Wheelchair: 0: Activity did not occur FIM - Locomotion: Ambulation Locomotion: Ambulation Assistive Devices: Designer, industrial/product Ambulation/Gait Assistance: 4: Min guard Locomotion: Ambulation: 6: Travels 150 ft or more with assistive device/no helper  Comprehension Comprehension Mode: Auditory Comprehension: 5-Follows basic conversation/direction: With no assist  Expression Expression Mode: Verbal Expression: 5-Expresses basic needs/ideas: With extra time/assistive device  Social Interaction Social Interaction:  5-Interacts appropriately 90% of the time - Needs monitoring or encouragement for participation or interaction.  Problem Solving Problem Solving: 5-Solves basic 90% of the time/requires cueing < 10% of the time  Memory Memory: 5-Recognizes or recalls 90% of the time/requires cueing < 10% of the time Medical Problem List and Plan:  1. Right subdural hematoma. Status post craniotomy evacuation of hematoma 09/19/2012 and redo craniotomy 09/22/2012 for recurrent subdural hematoma  2. DVT Prophylaxis/Anticoagulation: SCDs. Monitor for any signs of DVT  3. Mood: Xanax 0.25 mg twice a day.    4. Dysphagia. Dysphagia 2 thin liquid diet. No aspiration signs   -intake good 5. Neuropsych: This patient is not capable of making decisions on his/her own behalf.  6. Seizure prophylaxis. Keppra 500 mg twice a day  7. Hypertension. Tenormin 12.5 mg daily. Normotensive at present 8. Hypothyroidism. Synthroid  9. Non-insulin-dependent diabetes mellitus.  . Patient on Glucophage 500 mg bid currently (home dose)  -sugars under reasonable control for the most part until yesterday.   -follow up with PCP  as outpt 10. GERD. Protonix  11. Hyperlipidemia. Zocor  12. Wound: removed staples   LOS (Days) 10 A FACE TO FACE EVALUATION WAS PERFORMED  Katrinka Herbison T 10/10/2012, 7:56 AM

## 2012-10-10 NOTE — Progress Notes (Signed)
Patient and his son were given discharge instructions from De[facility] In[place], [place].  All questions answered.  Patient escorted via wheelchair by Truddie Crumble, NT to family's vehicle.  Patient discharged home with family.

## 2012-10-13 ENCOUNTER — Ambulatory Visit: Payer: Medicare Other | Admitting: Speech Pathology

## 2012-10-13 ENCOUNTER — Ambulatory Visit: Payer: Medicare Other | Admitting: Physical Therapy

## 2012-10-15 ENCOUNTER — Ambulatory Visit: Payer: Medicare Other | Admitting: Physical Therapy

## 2012-10-15 ENCOUNTER — Ambulatory Visit: Payer: Medicare Other | Attending: Family Medicine | Admitting: Speech Pathology

## 2012-10-15 DIAGNOSIS — R293 Abnormal posture: Secondary | ICD-10-CM | POA: Insufficient documentation

## 2012-10-15 DIAGNOSIS — Z5189 Encounter for other specified aftercare: Secondary | ICD-10-CM | POA: Insufficient documentation

## 2012-10-15 DIAGNOSIS — R41841 Cognitive communication deficit: Secondary | ICD-10-CM | POA: Insufficient documentation

## 2012-10-15 DIAGNOSIS — R269 Unspecified abnormalities of gait and mobility: Secondary | ICD-10-CM | POA: Insufficient documentation

## 2012-10-20 ENCOUNTER — Ambulatory Visit: Payer: Medicare Other | Admitting: Family Medicine

## 2012-10-21 ENCOUNTER — Ambulatory Visit: Payer: Medicare Other | Admitting: Physical Therapy

## 2012-10-21 ENCOUNTER — Ambulatory Visit: Payer: Medicare Other | Attending: Family Medicine | Admitting: Occupational Therapy

## 2012-10-21 DIAGNOSIS — Z5189 Encounter for other specified aftercare: Secondary | ICD-10-CM | POA: Insufficient documentation

## 2012-10-21 DIAGNOSIS — R293 Abnormal posture: Secondary | ICD-10-CM | POA: Insufficient documentation

## 2012-10-21 DIAGNOSIS — R41841 Cognitive communication deficit: Secondary | ICD-10-CM | POA: Insufficient documentation

## 2012-10-21 DIAGNOSIS — R269 Unspecified abnormalities of gait and mobility: Secondary | ICD-10-CM | POA: Insufficient documentation

## 2012-10-23 ENCOUNTER — Ambulatory Visit: Payer: Medicare Other | Admitting: Physical Therapy

## 2012-10-23 ENCOUNTER — Ambulatory Visit: Payer: Medicare Other

## 2012-10-24 ENCOUNTER — Encounter: Payer: Medicare Other | Admitting: *Deleted

## 2012-10-29 ENCOUNTER — Ambulatory Visit: Payer: Medicare Other | Admitting: *Deleted

## 2012-10-29 ENCOUNTER — Encounter: Payer: Medicare Other | Admitting: Speech Pathology

## 2012-10-29 ENCOUNTER — Encounter: Payer: Medicare Other | Attending: Physical Medicine & Rehabilitation | Admitting: Physical Medicine & Rehabilitation

## 2012-10-29 ENCOUNTER — Ambulatory Visit: Payer: Medicare Other

## 2012-10-29 ENCOUNTER — Encounter: Payer: Self-pay | Admitting: Physical Medicine & Rehabilitation

## 2012-10-29 ENCOUNTER — Ambulatory Visit: Payer: Medicare Other | Admitting: Occupational Therapy

## 2012-10-29 VITALS — BP 120/79 | HR 68 | Resp 14 | Ht 72.0 in | Wt 183.0 lb

## 2012-10-29 DIAGNOSIS — S129XXD Fracture of neck, unspecified, subsequent encounter: Secondary | ICD-10-CM

## 2012-10-29 DIAGNOSIS — I6200 Nontraumatic subdural hemorrhage, unspecified: Secondary | ICD-10-CM

## 2012-10-29 DIAGNOSIS — M8448XA Pathological fracture, other site, initial encounter for fracture: Secondary | ICD-10-CM | POA: Insufficient documentation

## 2012-10-29 DIAGNOSIS — S065X9A Traumatic subdural hemorrhage with loss of consciousness of unspecified duration, initial encounter: Secondary | ICD-10-CM

## 2012-10-29 DIAGNOSIS — G894 Chronic pain syndrome: Secondary | ICD-10-CM | POA: Insufficient documentation

## 2012-10-29 DIAGNOSIS — S129XXA Fracture of neck, unspecified, initial encounter: Secondary | ICD-10-CM | POA: Insufficient documentation

## 2012-10-29 DIAGNOSIS — M8448XD Pathological fracture, other site, subsequent encounter for fracture with routine healing: Secondary | ICD-10-CM

## 2012-10-29 NOTE — Patient Instructions (Signed)
WORK ON DAILY POSTURE AND STRETCHING  TYLENOL----YOU MAY TAKE UP TO 2000MG  DAILY FOR PAIN. DON'T FORGET HEAT, ICE, AND STRETCHING

## 2012-10-29 NOTE — Progress Notes (Signed)
Subjective:    [name], male    DOB: 21-Apr-1934, 79 y.o.   MRN: [id number]  HPI  Gerald Leach is back after his TBI. He was discharged less than a month ago. He continues to work with PT on balance/ gait. He uses a cane in general for longer dx. He didn't use it up to my office today. He complains of pain between his shoulder blades. He has a C7 fx from last fall which is evident on a CT from November. He is not using anything for pain including tylenol.  He is having problems with organizing tasks and items. STM is also an issue. He is seeing SLP at [facility] rehab as well. His wife helps keep things organized for him at home at this point.   Pain Inventory Average Pain 4 Pain Right Now 4 My pain is intermittent and dull  In the last 24 hours, has pain interfered with the following? General activity 0 Relation with others 0 Enjoyment of life 0 What TIME of day is your pain at its worst? daytime Sleep (in general) Fair  Pain is worse with: some activites Pain improves with: rest Relief from Meds: not taking any  Mobility walk without assistance how many minutes can you walk? 20 ability to climb steps?  yes do you drive?  no Do you have any goals in this area?  yes  Function retired  Neuro/Psych bladder control problems bowel control problems weakness trouble walking confusion  Prior Studies Any changes since last visit?  no  Physicians involved in your care Any changes since last visit?  no   History reviewed. No pertinent family history. History   Social History  . Marital Status: Married    Spouse Name: N/A    Number of Children: N/A  . Years of Education: N/A   Social History Main Topics  . Smoking status: Never Smoker   . Smokeless tobacco: Never Used  . Alcohol Use: Yes     Comment: rum mixed in diet coke 3 a week  . Drug Use: No  . Sexually Active: None   Other Topics Concern  . None   Social History Narrative  . None   Past  Surgical History  Procedure Laterality Date  . Brain aneurysm surgery      at [facility]  . Carotid artery aneurysm    . Colonoscopy      polyps  . Right hernia    . Craniotomy Right 09/19/2012    Procedure: CRANIOTOMY HEMATOMA EVACUATION SUBDURAL;  Surgeon: Clydene Fake, MD;  Location: [facility];  Service: Neurosurgery;  Laterality: Right;  . Craniotomy Right 09/22/2012    Procedure: CRANIOTOMY HEMATOMA EVACUATION SUBDURAL;  Surgeon: Clydene Fake, MD;  Location: [facility];  Service: Neurosurgery;  Laterality: Right;   Past Medical History  Diagnosis Date  . Hypertension   . Colonic polyp 2003  . GERD (gastroesophageal reflux disease)   . ED (erectile dysfunction)   . Hyperlipidemia   . Hypothyroidism   . Brain aneurysm    BP 120/79  Pulse 68  Resp 14  Ht 6' (1.829 m)  Wt 183 lb (83.008 kg)  BMI 24.81 kg/m2  SpO2 97%     Review of Systems  Gastrointestinal: Positive for diarrhea.  Musculoskeletal: Positive for gait problem.  Psychiatric/Behavioral: Positive for confusion.  All other systems reviewed and are negative.       Objective:   Physical Exam  General: Alert  and oriented x 3, No apparent distress HEENT: Head is normocephalic, atraumatic, PERRLA, EOMI, sclera anicteric, oral mucosa pink and moist, dentition intact, ext ear canals clear,  Neck: Supple without JVD or lymphadenopathy Heart: Reg rate and rhythm. No murmurs rubs or gallops Chest: CTA bilaterally without wheezes, rales, or rhonchi; no distress Abdomen: Soft, non-tender, non-distended, bowel sounds positive. Extremities: No clubbing, cyanosis, or edema. Pulses are 2+ Skin: Clean and intact without signs of breakdown Neuro: Pt with difficulties in attention, organization, following commands. Missed 1/2 numerical sequencing. Spelled world correctly forward and backward. Has a masked facies, walks with a wide based, shuffling gait. He recalled 1/3 words after 5 minutes. Strength and sensation  grossly intact.  Musculoskeletal: head forward posture, significant kyphosis. Pain along lower cervcial spine with palaption. Scapulae are tight.   Psych: Pt's affect is appropriate. Pt is cooperative         Assessment & Plan:  1.Right subdural hematoma with craniotomy and redo  craniotomy September 22, 2012 2. 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 Postural abnormality  Plan: 1. Tylenol for pain control. Utilize head and ice as well. 2. We discussed the importance of daily posture and stretching.  3. Cognitively, an Dance movement psychotherapist and restructuring would be helpful. His wife should continue to shift responsibility to him to see how he handles it.  4. Follow up with me PRN. 30 minutes of face to face patient care time were spent during this visit. All questions were encouraged and answered.

## 2012-10-30 ENCOUNTER — Other Ambulatory Visit: Payer: Self-pay | Admitting: Neurosurgery

## 2012-10-30 DIAGNOSIS — S065X9A Traumatic subdural hemorrhage with loss of consciousness of unspecified duration, initial encounter: Secondary | ICD-10-CM

## 2012-10-31 ENCOUNTER — Ambulatory Visit: Payer: Medicare Other

## 2012-10-31 ENCOUNTER — Ambulatory Visit
Admission: RE | Admit: 2012-10-31 | Discharge: 2012-10-31 | Disposition: A | Discharge status: Home / Self Care | Payer: Medicare Other | Source: Ambulatory Visit | Attending: Neurosurgery | Admitting: Neurosurgery

## 2012-10-31 DIAGNOSIS — S065X9A Traumatic subdural hemorrhage with loss of consciousness of unspecified duration, initial encounter: Secondary | ICD-10-CM

## 2012-11-03 ENCOUNTER — Ambulatory Visit: Payer: Medicare Other | Admitting: Family Medicine

## 2012-11-04 ENCOUNTER — Encounter: Payer: Self-pay | Admitting: Family Medicine

## 2012-11-04 ENCOUNTER — Ambulatory Visit (INDEPENDENT_AMBULATORY_CARE_PROVIDER_SITE_OTHER): Payer: Medicare Other | Admitting: Family Medicine

## 2012-11-04 VITALS — BP 110/80 | Temp 97.7°F | Wt 184.0 lb

## 2012-11-04 DIAGNOSIS — IMO0001 Reserved for inherently not codable concepts without codable children: Secondary | ICD-10-CM

## 2012-11-04 DIAGNOSIS — K219 Gastro-esophageal reflux disease without esophagitis: Secondary | ICD-10-CM

## 2012-11-04 DIAGNOSIS — E1165 Type 2 diabetes mellitus with hyperglycemia: Secondary | ICD-10-CM

## 2012-11-04 DIAGNOSIS — I6200 Nontraumatic subdural hemorrhage, unspecified: Secondary | ICD-10-CM

## 2012-11-04 DIAGNOSIS — I10 Essential (primary) hypertension: Secondary | ICD-10-CM

## 2012-11-04 NOTE — Patient Instructions (Addendum)
Stop the Protonix and the Tenormin  Check your blood pressure daily in the morning right arm sitting position  Taper record of all your blood pressure readings and bring the records with you in 3 weeks for followup  The days he did not go to physical therapy walk 30 minutes daily

## 2012-11-04 NOTE — Progress Notes (Signed)
  Subjective:    [name], male    DOB: 1934/05/20, 79 y.o.   MRN: [id number]  HPIJoseph is a 79 year old male married nonsmoker who comes in today for followup having been admitted for this spring and treated for a subdural hematoma.  They felt it was nontraumatic  The son Gerald Leach came to the house that day and noticed his dad was very sleepy. They took him to the hospital. Workup showed a subdural hematoma. He was taken to the operating room the subdural hematoma was evacuated without complications. Postoperatively recurred and he had to have a second operation. He recovered well except he was on the ventilator for a short period of time. He then went to rehabilitation and now is home. He goes to rehabilitation outpatient physical therapy 3 days a week.  He says he has some memory loss for the event.  He's able to walk without a walker male. He does use a cane.    Review of Systems    review of systems otherwise negative Objective:   Physical Exam  Well-developed well-nourished male no acute distress weight 184, BP 110/80 pulse 70 and regular. Scar right upper scalp from previous subdural hematoma evacuation  Cardiopulmonary exam normal      Assessment & Plan:  Status post subdural hematoma,,,,,,,,,, continue physical therapy  Hypotension,,,,,,,,,,,,, BP 110/80 and he is lightheaded when he stands up,,,,,,,, stop beta blocker  He feels he does not need the Protonix we will stop that also  Diabetes type 2 blood sugar at home in the 120 range continue that medication

## 2012-11-05 ENCOUNTER — Ambulatory Visit: Payer: Medicare Other

## 2012-11-05 ENCOUNTER — Ambulatory Visit: Payer: Medicare Other | Admitting: Physical Therapy

## 2012-11-05 ENCOUNTER — Ambulatory Visit: Payer: Medicare Other | Admitting: *Deleted

## 2012-11-07 ENCOUNTER — Ambulatory Visit: Payer: Medicare Other | Admitting: Physical Therapy

## 2012-11-07 ENCOUNTER — Ambulatory Visit: Payer: Medicare Other

## 2012-11-07 ENCOUNTER — Ambulatory Visit: Payer: Medicare Other | Admitting: Occupational Therapy

## 2012-11-11 ENCOUNTER — Ambulatory Visit: Payer: Medicare Other | Admitting: Physical Therapy

## 2012-11-11 ENCOUNTER — Ambulatory Visit: Payer: Medicare Other

## 2012-11-12 ENCOUNTER — Ambulatory Visit: Payer: Medicare Other

## 2012-11-12 ENCOUNTER — Ambulatory Visit: Payer: Medicare Other | Admitting: Occupational Therapy

## 2012-11-12 ENCOUNTER — Ambulatory Visit: Payer: Medicare Other | Admitting: Physical Therapy

## 2012-11-14 ENCOUNTER — Ambulatory Visit: Payer: Medicare Other | Admitting: *Deleted

## 2012-11-14 ENCOUNTER — Ambulatory Visit: Payer: Medicare Other | Admitting: Physical Therapy

## 2012-11-14 ENCOUNTER — Ambulatory Visit: Payer: Medicare Other

## 2012-11-25 ENCOUNTER — Encounter: Payer: Self-pay | Admitting: Family Medicine

## 2012-11-25 ENCOUNTER — Ambulatory Visit (INDEPENDENT_AMBULATORY_CARE_PROVIDER_SITE_OTHER): Payer: Medicare Other | Admitting: Family Medicine

## 2012-11-25 VITALS — BP 130/80 | Temp 97.9°F | Wt 187.0 lb

## 2012-11-25 DIAGNOSIS — Z9889 Other specified postprocedural states: Secondary | ICD-10-CM

## 2012-11-25 DIAGNOSIS — I10 Essential (primary) hypertension: Secondary | ICD-10-CM

## 2012-11-25 DIAGNOSIS — R1310 Dysphagia, unspecified: Secondary | ICD-10-CM

## 2012-11-25 NOTE — Progress Notes (Signed)
  Subjective:    [name], male    DOB: [birth year], 79 y.o.   MRN: [id number]  HPIJoseph is a 79 year old male married nonsmoker who comes in today for followup of hypertension, diabetes type 2, status post craniotomy for nontraumatic subdural hematoma. His wife says he's doing well except he can't hear.  BP 130/80  Blood sugar averaging about 110  He's on Keppra 500 mg twice a day for prophylaxis. He stated for followup with his neurosurgeon 3 months post surgery  He states he feels well and is walking with and without a cane. He still complains of low energy.    Review of Systems Review of systems otherwise negative    Objective:   Physical Exam  Well-developed well nourished male no acute distress weight 187, BP 130/80 pulse 70 and regular, blood pressure readings and blood sugar readings he brings in are normal.   ENT exam shows the left ear canal to be normal right ear canals for wax      Assessment & Plan:  Hypertension at goal continue current therapy  Diabetes adult continue current therapy  Status post craniotomy for nontraumatic subdural continue Keppra prophylaxis  Hearing loss left ear secondary to cerumen impaction removed by irrigation

## 2012-11-25 NOTE — Patient Instructions (Addendum)
Continue current medications  Since your blood sugar blood pressure normal just check them once weekly  Continue exercise program  Since you're doing well followup with me in one month

## 2012-12-03 ENCOUNTER — Ambulatory Visit: Payer: Medicare Other | Admitting: Family Medicine

## 2012-12-10 ENCOUNTER — Telehealth: Payer: Self-pay

## 2012-12-10 MED ORDER — LEVETIRACETAM 500 MG PO TABS: 500.0000 mg | ORAL_TABLET | Freq: Two times a day (BID) | ORAL | Status: DC

## 2012-12-10 NOTE — Telephone Encounter (Signed)
Patients wife was wondering if patient needs to stay on Keppra.  If so she needs it sent to [facility], and how long will he be on it?  Please advise.

## 2012-12-10 NOTE — Telephone Encounter (Signed)
Patient wife informed Keppra was refilled and he was to stay on it until seen by Dr Riley Kill again.  Appointment made for 01/14/2013.

## 2012-12-10 NOTE — Telephone Encounter (Signed)
Please continue until follow up with me at which time we'll discuss

## 2012-12-22 ENCOUNTER — Ambulatory Visit (INDEPENDENT_AMBULATORY_CARE_PROVIDER_SITE_OTHER): Payer: Medicare Other | Admitting: Family Medicine

## 2012-12-22 ENCOUNTER — Encounter: Payer: Self-pay | Admitting: Family Medicine

## 2012-12-22 VITALS — BP 140/90 | Temp 97.8°F | Wt 185.0 lb

## 2012-12-22 DIAGNOSIS — I10 Essential (primary) hypertension: Secondary | ICD-10-CM

## 2012-12-22 DIAGNOSIS — IMO0001 Reserved for inherently not codable concepts without codable children: Secondary | ICD-10-CM

## 2012-12-22 DIAGNOSIS — E1165 Type 2 diabetes mellitus with hyperglycemia: Secondary | ICD-10-CM

## 2012-12-22 NOTE — Progress Notes (Signed)
  Subjective:    [name], male    DOB: 13-Aug-1933, 79 y.o.   MRN: [id number]  HPI  Gerald Leach is a 79 year old male who comes in today for followup of hypertension diabetes  We have gradually been tapering him off his beta blocker because his blood pressure is dropping too low and he was having episodes of presyncope. Off his beta blocker for 1 month BP 140/90 pulse 70 and regular and no more episodes of lightheadedness.  He takes metformin 500 mg twice a day for diabetes blood sugar in the 120 range no hypoglycemia  Review of Systems    review of systems otherwise negative Objective:   Physical Exam  Well-developed well-nourished male no acute distress BP right arm sitting position 140/90 pulse 70 and regular      Assessment & Plan:  Hypertension at goal continue to avoid beta blockers his blood pressure is normal

## 2012-12-23 ENCOUNTER — Ambulatory Visit: Payer: Medicare Other | Admitting: Family Medicine

## 2013-01-06 ENCOUNTER — Other Ambulatory Visit: Payer: Self-pay | Admitting: Family Medicine

## 2013-01-14 ENCOUNTER — Encounter: Payer: Medicare Other | Attending: Physical Medicine & Rehabilitation | Admitting: Physical Medicine & Rehabilitation

## 2013-01-14 ENCOUNTER — Encounter: Payer: Self-pay | Admitting: Physical Medicine & Rehabilitation

## 2013-01-14 VITALS — BP 130/90 | HR 88 | Resp 15 | Ht 72.0 in | Wt 185.0 lb

## 2013-01-14 DIAGNOSIS — G894 Chronic pain syndrome: Secondary | ICD-10-CM | POA: Insufficient documentation

## 2013-01-14 DIAGNOSIS — Z9889 Other specified postprocedural states: Secondary | ICD-10-CM

## 2013-01-14 DIAGNOSIS — S065XAA Traumatic subdural hemorrhage with loss of consciousness status unknown, initial encounter: Secondary | ICD-10-CM

## 2013-01-14 DIAGNOSIS — M8448XA Pathological fracture, other site, initial encounter for fracture: Secondary | ICD-10-CM | POA: Insufficient documentation

## 2013-01-14 DIAGNOSIS — S065X9A Traumatic subdural hemorrhage with loss of consciousness of unspecified duration, initial encounter: Secondary | ICD-10-CM

## 2013-01-14 DIAGNOSIS — S129XXD Fracture of neck, unspecified, subsequent encounter: Secondary | ICD-10-CM

## 2013-01-14 DIAGNOSIS — M8448XD Pathological fracture, other site, subsequent encounter for fracture with routine healing: Secondary | ICD-10-CM

## 2013-01-14 DIAGNOSIS — E039 Hypothyroidism, unspecified: Secondary | ICD-10-CM

## 2013-01-14 DIAGNOSIS — I6200 Nontraumatic subdural hemorrhage, unspecified: Secondary | ICD-10-CM | POA: Insufficient documentation

## 2013-01-14 NOTE — Patient Instructions (Signed)
KEPPRA TAPER:  500MG  AT BED TIME FOR ONE WEEK, THEN 250MG  AT BED TIME FOR TWO WEEKS THEN STOP.   CHECK WITH DR. [name] ABOUT YOUR THYROID LEVELS AT YOUR NEXT VISIT.

## 2013-01-14 NOTE — Progress Notes (Signed)
Subjective:    [name], male    DOB: April 12, 1934, 79 y.o.   MRN: [id number]  HPI  Gerald Leach is back regarding his TBI. He reports that nothing has changed. His energy has improved however, but it's not back to normal. He tries to walk every day.   From a pain standpoint, he still is having upper back pain. He has oxycodone from a prior visit which he occasionally takes.   From a memory standpoint he still has some deficits with day to day information, but has seen improvement. 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   He hasn't had any falls but has tended to stumble at times at home on uneven surfaces, thresholds, etc.    Pain Inventory Average Pain 4 Pain Right Now 4 My pain is dull  In the last 24 hours, has pain interfered with the following? General activity 2 Relation with others 2 Enjoyment of life 2 What TIME of day is your pain at its worst? morning Sleep (in general) Fair  Pain is worse with: sitting Pain improves with: na Relief from Meds: na  Mobility use a cane how many minutes can you walk? 30 ability to climb steps?  yes do you drive?  no  Function employed # of hrs/week 20  Neuro/Psych bladder control problems bowel control problems anxiety  Prior Studies Any changes since last visit?  no  Physicians involved in your care Any changes since last visit?  no   History reviewed. No pertinent family history. History   Social History  . Marital Status: Married    Spouse Name: N/A    Number of Children: N/A  . Years of Education: N/A   Social History Main Topics  . Smoking status: Never Smoker   . Smokeless tobacco: Never Used  . Alcohol Use: Yes     Comment: rum mixed in diet coke 3 a week  . Drug Use: No  . Sexually Active: None   Other Topics Concern  . None   Social History Narrative  . None   Past  Surgical History  Procedure Laterality Date  . Brain aneurysm surgery      at [facility]  . Carotid artery aneurysm    . Colonoscopy      polyps  . Right hernia    . Craniotomy Right 09/19/2012    Procedure: CRANIOTOMY HEMATOMA EVACUATION SUBDURAL;  Surgeon: Clydene Fake, MD;  Location: [facility];  Service: Neurosurgery;  Laterality: Right;  . Craniotomy Right 09/22/2012    Procedure: CRANIOTOMY HEMATOMA EVACUATION SUBDURAL;  Surgeon: Clydene Fake, MD;  Location: [facility];  Service: Neurosurgery;  Laterality: Right;   Past Medical History  Diagnosis Date  . Hypertension   . Colonic polyp 2003  . GERD (gastroesophageal reflux disease)   . ED (erectile dysfunction)   . Hyperlipidemia   . Hypothyroidism   . Brain aneurysm    BP 130/90  Pulse 88  Resp 15  Ht 6' (1.829 m)  Wt 185 lb (83.915 kg)  BMI 25.08 kg/m2  SpO2 96%     Review of Systems  Gastrointestinal:       Bowel control problems  Genitourinary:       Bladder control problems  Psychiatric/Behavioral: Positive for agitation.  All other systems reviewed and are negative.       Objective:  Physical Exam  General: Alert and oriented x 3, No apparent distress. HEENT: Head is normocephalic, atraumatic, PERRLA, EOMI, sclera anicteric, oral mucosa pink and moist, dentition intact, ext ear canals clear,  Neck: Supple without JVD or lymphadenopathy  Heart: Reg rate and rhythm. No murmurs rubs or gallops  Chest: CTA bilaterally without wheezes, rales, or rhonchi; no distress  Abdomen: Soft, non-tender, non-distended, bowel sounds positive.  Extremities: No clubbing, cyanosis, or edema. Pulses are 2+  Skin: Clean and intact without signs of breakdown  Neuro: Pt with difficulties in attention, organization, following commands. Missed 1/2 numerical sequencing. Spelled world correctly forward and backward. Has a masked facies, walks with a wide based, shuffling gait. He recalled 1/3 words after 5 minutes. Strength  and sensation grossly intact.  Musculoskeletal: head forward posture, significant kyphosis. Pain along lower cervcial spine with palaption. Scapulae are tight.  Psych: Pt's affect is appropriate. Pt is cooperative   Assessment & Plan:   1.Right subdural hematoma with craniotomy and redo  craniotomy September 22, 2012  2. 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 Postural abnormality    Plan:  1. Tylenol and posture improvement to help with back pain. Pilates principles were provided. 2. Discussed better toe lift with swing phase of gait. This will decrease fall risk also 3. Will wean keppra to off as it is no longer indicated. Instructions were provided. 4. Follow up with me PRN. 30 minutes of face to face patient care time were spent during this visit. All questions were encouraged and answered.  5. Encouraged adequate sleep (8-10 hrs per night) and increased exercise, social interaction. He is NOT ready to drive at this point, but I felt that it was appopriate to do some trials in an empty parking lot with his wife.  6. Follow up with Dr. [name] this fall regarding thyroid labs.

## 2013-02-02 ENCOUNTER — Telehealth: Payer: Self-pay | Admitting: Family Medicine

## 2013-02-02 NOTE — Telephone Encounter (Signed)
Agree with advice.  I would have them follow up with neurologist they have seen.

## 2013-02-02 NOTE — Telephone Encounter (Signed)
Patient Information:  Caller Name: Britta Mccreedy  Phone: [phone]  Patient: Gerald Leach, Gerald Leach  Gender: Male  DOB: [birth year]  Age: 79 Years  PCP: Kelle Darting [facility])  Office Follow Up:  Does the office need to follow up with this patient?: Yes  Instructions For The Office: Please follow up with patient regarding episode during the night.  RN also advised wife to follow up with neurologist.  She is anxious and is needed reassurance that things are ok.  RN Note:  Wife is just calling to see if she needs to be concerned.  No current symptoms to triage.  She notes that during the night it took some effort to convince husband that it was during the night and too early to go back to bed due to "stubbornness" per wife. No angry or argumentative behavior noted.  RN asked if she witnessed any other confusion outside of time with wife stating no.  RN will forward note to front office for follow up with patient's concern.  Also advised wife to notified neurologist that is following husband since discharge from surgeon's care.  Wife said she will comply.  Symptoms  Reason For Call & Symptoms: Post up brain surgery.  Awoke during the night-acting weird.  He was trying to get dressed to go to work.  Wife was able to convince that it was too early to go to work.  He went back to bed and is still sleeping on and off.  Since then he has been alert, oriented and appropriate in all spheres.  Post Brain surgery 04/07- due to subdural hematoma  Recently weaned off preventive seizure  Reviewed Health History In EMR: Yes  Reviewed Medications In EMR: Yes  Reviewed Allergies In EMR: Yes  Reviewed Surgeries / Procedures: Yes  Date of Onset of Symptoms: 02/02/2013  Guideline(s) Used:  No Protocol Available - Information Only  Disposition Per Guideline:   Discuss with PCP and Callback by Nurse Today  Reason For Disposition Reached:   Nursing judgment  Advice Given:  Call Back If:  New symptoms  develop  You become worse.  Patient Will Follow Care Advice:  YES

## 2013-02-02 NOTE — Telephone Encounter (Signed)
Tired to call the wife but unable to leave a message at the cell number.  Will try again later.

## 2013-02-03 ENCOUNTER — Emergency Department (HOSPITAL_COMMUNITY)
Admission: EM | Admit: 2013-02-03 | Discharge: 2013-02-04 | Disposition: A | Discharge status: Home / Self Care | Payer: Medicare Other | Attending: Emergency Medicine | Admitting: Emergency Medicine

## 2013-02-03 ENCOUNTER — Emergency Department (HOSPITAL_COMMUNITY): Payer: Medicare Other

## 2013-02-03 ENCOUNTER — Encounter (HOSPITAL_COMMUNITY): Payer: Self-pay | Admitting: Emergency Medicine

## 2013-02-03 ENCOUNTER — Telehealth: Payer: Self-pay | Admitting: Family Medicine

## 2013-02-03 ENCOUNTER — Telehealth: Payer: Self-pay

## 2013-02-03 DIAGNOSIS — Z8679 Personal history of other diseases of the circulatory system: Secondary | ICD-10-CM | POA: Insufficient documentation

## 2013-02-03 DIAGNOSIS — R569 Unspecified convulsions: Secondary | ICD-10-CM | POA: Insufficient documentation

## 2013-02-03 DIAGNOSIS — W19XXXA Unspecified fall, initial encounter: Secondary | ICD-10-CM | POA: Insufficient documentation

## 2013-02-03 DIAGNOSIS — N529 Male erectile dysfunction, unspecified: Secondary | ICD-10-CM | POA: Insufficient documentation

## 2013-02-03 DIAGNOSIS — M6281 Muscle weakness (generalized): Secondary | ICD-10-CM | POA: Insufficient documentation

## 2013-02-03 DIAGNOSIS — E785 Hyperlipidemia, unspecified: Secondary | ICD-10-CM | POA: Insufficient documentation

## 2013-02-03 DIAGNOSIS — K219 Gastro-esophageal reflux disease without esophagitis: Secondary | ICD-10-CM | POA: Insufficient documentation

## 2013-02-03 DIAGNOSIS — Y939 Activity, unspecified: Secondary | ICD-10-CM | POA: Insufficient documentation

## 2013-02-03 DIAGNOSIS — E039 Hypothyroidism, unspecified: Secondary | ICD-10-CM | POA: Insufficient documentation

## 2013-02-03 DIAGNOSIS — Z79899 Other long term (current) drug therapy: Secondary | ICD-10-CM | POA: Insufficient documentation

## 2013-02-03 DIAGNOSIS — I10 Essential (primary) hypertension: Secondary | ICD-10-CM | POA: Insufficient documentation

## 2013-02-03 DIAGNOSIS — S0093XA Contusion of unspecified part of head, initial encounter: Secondary | ICD-10-CM

## 2013-02-03 DIAGNOSIS — R5381 Other malaise: Secondary | ICD-10-CM | POA: Insufficient documentation

## 2013-02-03 DIAGNOSIS — Y92009 Unspecified place in unspecified non-institutional (private) residence as the place of occurrence of the external cause: Secondary | ICD-10-CM | POA: Insufficient documentation

## 2013-02-03 DIAGNOSIS — R609 Edema, unspecified: Secondary | ICD-10-CM | POA: Insufficient documentation

## 2013-02-03 DIAGNOSIS — S61409A Unspecified open wound of unspecified hand, initial encounter: Secondary | ICD-10-CM | POA: Insufficient documentation

## 2013-02-03 DIAGNOSIS — S0990XA Unspecified injury of head, initial encounter: Secondary | ICD-10-CM | POA: Insufficient documentation

## 2013-02-03 DIAGNOSIS — S0100XA Unspecified open wound of scalp, initial encounter: Secondary | ICD-10-CM | POA: Insufficient documentation

## 2013-02-03 DIAGNOSIS — Z8719 Personal history of other diseases of the digestive system: Secondary | ICD-10-CM | POA: Insufficient documentation

## 2013-02-03 DIAGNOSIS — R21 Rash and other nonspecific skin eruption: Secondary | ICD-10-CM | POA: Insufficient documentation

## 2013-02-03 DIAGNOSIS — S0003XA Contusion of scalp, initial encounter: Secondary | ICD-10-CM | POA: Insufficient documentation

## 2013-02-03 HISTORY — DX: Unspecified convulsions: R56.9

## 2013-02-03 LAB — CBC WITH DIFFERENTIAL/PLATELET
Basophils Absolute: 0 10*3/uL (ref 0.0–0.1)
Eosinophils Absolute: 0 10*3/uL (ref 0.0–0.7)
Eosinophils Relative: 0 % (ref 0–5)
HCT: 42.1 % (ref 39.0–52.0)
MCH: 27.5 pg (ref 26.0–34.0)
MCHC: 34.7 g/dL (ref 30.0–36.0)
MCV: 79.4 fL (ref 78.0–100.0)
Monocytes Absolute: 1.4 10*3/uL — ABNORMAL HIGH (ref 0.1–1.0)
Platelets: 218 10*3/uL (ref 150–400)
RDW: 14.5 % (ref 11.5–15.5)

## 2013-02-03 LAB — BASIC METABOLIC PANEL
Calcium: 9 mg/dL (ref 8.4–10.5)
Creatinine, Ser: 0.68 mg/dL (ref 0.50–1.35)
GFR calc non Af Amer: 90 mL/min — ABNORMAL LOW (ref >90)
Sodium: 137 mEq/L (ref 135–145)

## 2013-02-03 MED ORDER — LEVETIRACETAM 500 MG PO TABS
1000.0000 mg | ORAL_TABLET | Freq: Once | ORAL | Status: AC
  Administered 2013-02-03: 1000 mg via ORAL
  Filled 2013-02-03: qty 2

## 2013-02-03 MED ORDER — ACETAMINOPHEN 325 MG PO TABS
650.0000 mg | ORAL_TABLET | Freq: Once | ORAL | Status: AC
  Administered 2013-02-03: 650 mg via ORAL
  Filled 2013-02-03: qty 2

## 2013-02-03 NOTE — Telephone Encounter (Signed)
Patient's wife called in and said that Mr. Gut is having some problems today standing and is a little incoherent. She said she tried calling his PCP. She wanted to know what to do.

## 2013-02-03 NOTE — ED Provider Notes (Signed)
CSN: [id number]     Arrival date & time 02/03/13  2105 History     First MD Initiated Contact with Patient 02/03/13 2118     Chief Complaint  Patient presents with  . Fall  . Seizures   (Consider location/radiation/quality/duration/timing/severity/associated sxs/prior Treatment) HPI Comments: 79 yo male with aneurysm/ bleeding/ craniotomy hx presents after witnessed brief generalized seizures PTA.  Pt was on keppra for seizures and was being weaned off since he has not had any since brain surgery however family states he went from 1000 mg to none instead of gradual.   Pt did hit head after seizure.  Pt feels okay other than abrasions and fatigue.  No recent infections.  Family with patient.  Improved with time.   Patient is a 79 y.o. male presenting with fall and seizures. The history is provided by the patient and a relative.  Fall Pertinent negatives include no chest pain, no abdominal pain, no headaches and no shortness of breath.  Seizures   Past Medical History  Diagnosis Date  . Hypertension   . Colonic polyp 2003  . GERD (gastroesophageal reflux disease)   . ED (erectile dysfunction)   . Hyperlipidemia   . Hypothyroidism   . Brain aneurysm   . Seizures     per family   Past Surgical History  Procedure Laterality Date  . Brain aneurysm surgery      at [facility]  . Carotid artery aneurysm    . Colonoscopy      polyps  . Right hernia    . Craniotomy Right 09/19/2012    Procedure: CRANIOTOMY HEMATOMA EVACUATION SUBDURAL;  Surgeon: Clydene Fake, MD;  Location: [facility];  Service: Neurosurgery;  Laterality: Right;  . Craniotomy Right 09/22/2012    Procedure: CRANIOTOMY HEMATOMA EVACUATION SUBDURAL;  Surgeon: Clydene Fake, MD;  Location: [facility];  Service: Neurosurgery;  Laterality: Right;   No family history on file. History  Substance Use Topics  . Smoking status: Never Smoker   . Smokeless tobacco: Never Used  . Alcohol Use: Yes     Comment: rum mixed in  diet coke 3 a week    Review of Systems  Constitutional: Positive for fatigue. Negative for fever and chills.  HENT: Negative for neck pain and neck stiffness.   Eyes: Negative for visual disturbance.  Respiratory: Negative for shortness of breath.   Cardiovascular: Negative for chest pain.  Gastrointestinal: Negative for vomiting and abdominal pain.  Genitourinary: Negative for dysuria and flank pain.  Musculoskeletal: Negative for back pain.  Skin: Positive for rash and wound.  Neurological: Positive for seizures and weakness (general). Negative for light-headedness and headaches.    Allergies  Review of patient's allergies indicates no known allergies.  Home Medications   Current Outpatient Rx  Name  Route  Sig  Dispense  Refill  . levETIRAcetam (KEPPRA) 500 MG tablet   Oral   Take 500 mg by mouth 2 (two) times daily.          Marland Kitchen levothyroxine (SYNTHROID, LEVOTHROID) 50 MCG tablet   Oral   Take 1 tablet (50 mcg total) by mouth daily before breakfast.   30 tablet   1   . metFORMIN (GLUCOPHAGE) 500 MG tablet   Oral   Take 1 tablet (500 mg total) by mouth 2 (two) times daily with a meal.   60 tablet   1   . Multiple Vitamin (MULTIVITAMIN WITH MINERALS) TABS   Oral   Take  1 tablet by mouth daily.         Marland Kitchen omeprazole (PRILOSEC) 20 MG capsule   Oral   Take 20 mg by mouth every Monday, Wednesday, and Friday.          . simvastatin (ZOCOR) 10 MG tablet   Oral   Take 1 tablet (10 mg total) by mouth at bedtime.   30 tablet   1   . ONE TOUCH LANCETS MISC   Does not apply   1 each by Does not apply route daily.   200 each   0     DM II - 250.00    BP 169/89  Pulse 97  Temp(Src) 97.7 F (36.5 C) (Oral)  Resp 18  SpO2 98% Physical Exam  Nursing note and vitals reviewed. Constitutional: He is oriented to person, place, and time. He appears well-developed and well-nourished.  HENT:  Head: Normocephalic and atraumatic.  Eyes: Conjunctivae are normal.  Right eye exhibits no discharge. Left eye exhibits no discharge.  Neck: Normal range of motion. Neck supple. No tracheal deviation present.  Cardiovascular: Normal rate and regular rhythm.   Pulmonary/Chest: Effort normal and breath sounds normal.  Abdominal: Soft. He exhibits no distension. There is no tenderness. There is no guarding.  Musculoskeletal: He exhibits edema (mild LE bilateral) and tenderness (left frontal bone).  Neurological: He is alert and oriented to person, place, and time. No cranial nerve deficit.  Skin: Skin is warm.  Left dorsal hand skin tear, no bleeding or focal bone pain Left frontal bone mild swelling and abrasions Left parietal 2 cm lac, no active bleeding   Psychiatric: He has a normal mood and affect.    ED Course   Procedures (including critical care time)  Labs Reviewed  CBC WITH DIFFERENTIAL - Abnormal; Notable for the following:    WBC 14.5 (*)    Neutro Abs 11.0 (*)    Monocytes Absolute 1.4 (*)    All other components within normal limits  BASIC METABOLIC PANEL - Abnormal; Notable for the following:    Glucose, Bld 139 (*)    GFR calc non Af Amer 90 (*)    All other components within normal limits   Ct Head Wo Contrast  02/03/2013   *RADIOLOGY REPORT*  Clinical Data:  Fall, seizures  CT HEAD WITHOUT CONTRAST CT CERVICAL SPINE WITHOUT CONTRAST  Technique:  Multidetector CT imaging of the head and cervical spine was performed following the standard protocol without intravenous contrast.  Multiplanar CT image reconstructions of the cervical spine were also generated.  Comparison:   Prior CT from 10/31/2012  CT HEAD  Findings: Previously identified at partially calcified supraclinoid mass is stable in size and appearance measuring 2.4 x 1.4 cm. Overall ventricular dilatation is not significantly changed.  No acute intracranial hemorrhage or infarct identified.  Confluent and scattered hypodensity within the periventricular deep white matter is  consistent with chronic microvascular ischemic changes.  Calvarium is stable as are not with sequelae of prior right frontal craniotomy again noted.  Orbital soft tissues are normal.  Mastoid air cells are clear.  IMPRESSION: 1. No acute intracranial process. 2.  Stable ventricular enlargement. 3.  Stable appearance of partially calcified right supraclinoid mass.  CT CERVICAL SPINE  Findings: There is no acute fracture or listhesis within the cervical spine.  Normal C1-2 articulations are intact. Prevertebral soft tissues are normal.  Multilevel degenerative disc disease is seen throughout the cervical spine.  Prominent bridging anterior osteophytes is present  at the C2-3 level anteriorly.  Multilevel facet arthropathy is present.  The partially visualized lung apices are clear.  IMPRESSION: 1.  No CT evidence of acute traumatic injury. 2.  Multilevel degenerative disc disease and facet arthrosis.   Original Report Authenticated By: Rise Mu, M.D.   Ct Cervical Spine Wo Contrast  02/03/2013   *RADIOLOGY REPORT*  Clinical Data:  Fall, seizures  CT HEAD WITHOUT CONTRAST CT CERVICAL SPINE WITHOUT CONTRAST  Technique:  Multidetector CT imaging of the head and cervical spine was performed following the standard protocol without intravenous contrast.  Multiplanar CT image reconstructions of the cervical spine were also generated.  Comparison:   Prior CT from 10/31/2012  CT HEAD  Findings: Previously identified at partially calcified supraclinoid mass is stable in size and appearance measuring 2.4 x 1.4 cm. Overall ventricular dilatation is not significantly changed.  No acute intracranial hemorrhage or infarct identified.  Confluent and scattered hypodensity within the periventricular deep white matter is consistent with chronic microvascular ischemic changes.  Calvarium is stable as are not with sequelae of prior right frontal craniotomy again noted.  Orbital soft tissues are normal.  Mastoid air cells are  clear.  IMPRESSION: 1. No acute intracranial process. 2.  Stable ventricular enlargement. 3.  Stable appearance of partially calcified right supraclinoid mass.  CT CERVICAL SPINE  Findings: There is no acute fracture or listhesis within the cervical spine.  Normal C1-2 articulations are intact. Prevertebral soft tissues are normal.  Multilevel degenerative disc disease is seen throughout the cervical spine.  Prominent bridging anterior osteophytes is present at the C2-3 level anteriorly.  Multilevel facet arthropathy is present.  The partially visualized lung apices are clear.  IMPRESSION: 1.  No CT evidence of acute traumatic injury. 2.  Multilevel degenerative disc disease and facet arthrosis.   Original Report Authenticated By: Rise Mu, M.D.   1. Seizure   2. Fall at home, initial encounter   3. Head contusion, initial encounter     MDM  Break through seizure with weaning keppra.  Keppra 1000 mg given in ED.  CT head no acute findings.  WBC elevated from stress/ seizures CT neck no acute findings, neuro intact, C collar cleared. Observed, no seizures in ED.  Family and pt okay with outpt fup, discussed keppra dosing.  Tetanus is UTD.  DC  Enid Skeens, MD 02/04/13 [phone]

## 2013-02-03 NOTE — Telephone Encounter (Signed)
Patient Information:  Caller Name: Britta Mccreedy  Phone: [phone]  Patient: Gerald Leach, Gerald Leach  Gender: Male  DOB: 1934-03-19  Age: 79 Years  PCP: Kelle Darting [facility])  Office Follow Up:  Does the office need to follow up with this patient?: No  Instructions For The Office: N/A   Symptoms  Reason For Call & Symptoms: Britta Mccreedy  states Yuuki  may have had a seizure on 02/02/17. States he had episode of uncontrollable shaking of arms  lasting one minute at 0900 on 02/02/13. Britta Mccreedy states he had  fallen and was on the floor on 02/02/13. States Tyqwan  had an episode of arm shaking at 0900 on 02/03/13 lasting one minute .  States he had another episode of arms shaking at 0930 lasting one minute. No loss of consciousness.  Is a known diabetic and has  not checked blood glucose, only checks blood glucose once a week.Asked "what kind of ice cream do they serve in the Hospital?"" He might need to cut down on ice cream." Is on a Keppra taper. Had subdural hematoma wit craniotomy on 09/19/12 and craniotomy re- do on 09/22/12.Per seizure protocol has 911 disposition due to first seizure and is known diabetic. Refuses 911 stating "we don't need that added expense. " Encouraged 911. Will go to ED.  Reviewed Health History In EMR: Yes  Reviewed Medications In EMR: Yes  Reviewed Allergies In EMR: Yes  Reviewed Surgeries / Procedures: Yes  Date of Onset of Symptoms: 02/02/2013  Guideline(s) Used:  Seizure  Disposition Per Guideline:   Call EMS 911 Now  Reason For Disposition Reached:   Known diabetic  Advice Given:  N/A  Patient Refused Recommendation:  Patient Will Go To ED  States " we don't need that added expense."

## 2013-02-03 NOTE — ED Notes (Signed)
Per EMS- Pt stated he fell getting into chair. Pt family stated they witnessed seizure activity that led to fall, Pt positive for history of seizures. Pt was complaining of pain to back of head although no abrasion noted to back of head. C-spine collar in place. Abrasions present to L head, bilateral feet, L hand and L forearm. A & Ox4

## 2013-02-03 NOTE — Telephone Encounter (Signed)
Attempted to call wife but unable to leave message

## 2013-02-04 NOTE — Telephone Encounter (Signed)
Noted. But it was not inappropriate to attempt to wean him given the clinical circumstances.

## 2013-02-04 NOTE — Telephone Encounter (Signed)
Patient went to the hospital and seemed to have a seizure.  Patients wife was very unhappy that he was being weaned off his medication and now he had another seizure.  She says that they will not be back and they will follow up with Dr [name].

## 2013-02-04 NOTE — ED Notes (Signed)
Pt. Wounds cleaned and bandaged.

## 2013-03-24 ENCOUNTER — Ambulatory Visit (INDEPENDENT_AMBULATORY_CARE_PROVIDER_SITE_OTHER): Payer: Medicare Other | Admitting: Family Medicine

## 2013-03-24 ENCOUNTER — Encounter: Payer: Self-pay | Admitting: Family Medicine

## 2013-03-24 VITALS — BP 120/80 | Temp 97.3°F | Wt 190.0 lb

## 2013-03-24 DIAGNOSIS — Z9889 Other specified postprocedural states: Secondary | ICD-10-CM

## 2013-03-24 DIAGNOSIS — I10 Essential (primary) hypertension: Secondary | ICD-10-CM

## 2013-03-24 DIAGNOSIS — IMO0001 Reserved for inherently not codable concepts without codable children: Secondary | ICD-10-CM

## 2013-03-24 DIAGNOSIS — I6200 Nontraumatic subdural hemorrhage, unspecified: Secondary | ICD-10-CM

## 2013-03-24 DIAGNOSIS — E1165 Type 2 diabetes mellitus with hyperglycemia: Secondary | ICD-10-CM

## 2013-03-24 DIAGNOSIS — S065X9A Traumatic subdural hemorrhage with loss of consciousness of unspecified duration, initial encounter: Secondary | ICD-10-CM

## 2013-03-24 DIAGNOSIS — S065XAA Traumatic subdural hemorrhage with loss of consciousness status unknown, initial encounter: Secondary | ICD-10-CM

## 2013-03-24 DIAGNOSIS — Z23 Encounter for immunization: Secondary | ICD-10-CM

## 2013-03-24 DIAGNOSIS — G40309 Generalized idiopathic epilepsy and epileptic syndromes, not intractable, without status epilepticus: Secondary | ICD-10-CM

## 2013-03-24 DIAGNOSIS — G40409 Other generalized epilepsy and epileptic syndromes, not intractable, without status epilepticus: Secondary | ICD-10-CM

## 2013-03-24 DIAGNOSIS — IMO0002 Reserved for concepts with insufficient information to code with codable children: Secondary | ICD-10-CM

## 2013-03-24 LAB — CBC WITH DIFFERENTIAL/PLATELET
Basophils Relative: 0.4 % (ref 0.0–3.0)
Eosinophils Absolute: 0 10*3/uL (ref 0.0–0.7)
Eosinophils Relative: 0.4 % (ref 0.0–5.0)
Hemoglobin: 14.4 g/dL (ref 13.0–17.0)
Lymphocytes Relative: 30.1 % (ref 12.0–46.0)
MCHC: 32.9 g/dL (ref 30.0–36.0)
MCV: 81.2 fl (ref 78.0–100.0)
Neutro Abs: 5.4 10*3/uL (ref 1.4–7.7)
Neutrophils Relative %: 60.8 % (ref 43.0–77.0)
RBC: 5.4 Mil/uL (ref 4.22–5.81)
WBC: 9 10*3/uL (ref 4.5–10.5)

## 2013-03-24 LAB — POCT URINALYSIS DIPSTICK
Blood, UA: NEGATIVE
Glucose, UA: NEGATIVE
Spec Grav, UA: 1.025

## 2013-03-24 LAB — MICROALBUMIN / CREATININE URINE RATIO
Creatinine,U: 188 mg/dL
Microalb Creat Ratio: 0.5 mg/g (ref 0.0–30.0)
Microalb, Ur: 0.9 mg/dL (ref 0.0–1.9)

## 2013-03-24 LAB — HEPATIC FUNCTION PANEL
ALT: 14 U/L (ref 0–53)
Bilirubin, Direct: 0 mg/dL (ref 0.0–0.3)
Total Bilirubin: 0.6 mg/dL (ref 0.3–1.2)
Total Protein: 7.4 g/dL (ref 6.0–8.3)

## 2013-03-24 LAB — BASIC METABOLIC PANEL
CO2: 28 mEq/L (ref 19–32)
Calcium: 9.3 mg/dL (ref 8.4–10.5)
Creatinine, Ser: 0.9 mg/dL (ref 0.4–1.5)
GFR: 92.79 mL/min (ref >60.00)
Sodium: 135 mEq/L (ref 135–145)

## 2013-03-24 LAB — HEMOGLOBIN A1C: Hgb A1c MFr Bld: 6.6 % — ABNORMAL HIGH (ref 4.6–6.5)

## 2013-03-24 LAB — TSH: TSH: 3.65 u[IU]/mL (ref 0.35–5.50)

## 2013-03-24 NOTE — Progress Notes (Signed)
  Subjective:    [name], male    DOB: 16-Nov-1933, 79 y.o.   MRN: [id number]  HPI Gerald Leach is a 79 year old married male nonsmoker who comes in today for evaluation of 2 problems  He is diabetes type 2 controlled with metformin 500 mg twice a day he doesn't really follow a diet he does try to walk on a regular basis but is having trouble with his gait. Blood sugar today fasting 87  He had a CNS aneurysm treated at [facility] in 2002 subsequently had a subdural hematoma from a fall in 2014. This subdural required to evacuate comes. He's done extremely well. His neurologist Dr. Fabio Asa has tried to taper his Keppra and he had a seizure. He was taken to the emergency room and they went ahead and increased his Keppra and advise him to stay on 500 mg twice a day however he thinks this is causing problems with his gait he would like a second neurologic opinion   Review of Systems Review of systems otherwise negative    Objective:   Physical Exam Well-developed and nourished male no acute distress vital signs stable afebrile BP 120/80       Assessment & Plan:  Diabetes type 2 at goal continue current therapy  Hypertension at goal continue current therapy  History of seizure disorder continue Keppra 500 mg twice a day neurologic second opinion Dr. Allena Katz and group

## 2013-03-24 NOTE — Patient Instructions (Signed)
Continue current medications  Labs and urine today  We will call you in gets her lab work back  I will set up a consult with Dr. Allena Katz for further evaluation of the seizures disorder and your concern about possible side effects from the medication

## 2013-03-31 ENCOUNTER — Ambulatory Visit (INDEPENDENT_AMBULATORY_CARE_PROVIDER_SITE_OTHER): Payer: Medicare Other | Admitting: Neurology

## 2013-03-31 ENCOUNTER — Encounter: Payer: Self-pay | Admitting: Neurology

## 2013-03-31 VITALS — BP 108/62 | HR 78 | Ht 72.0 in | Wt 188.0 lb

## 2013-03-31 DIAGNOSIS — R269 Unspecified abnormalities of gait and mobility: Secondary | ICD-10-CM

## 2013-03-31 DIAGNOSIS — G608 Other hereditary and idiopathic neuropathies: Secondary | ICD-10-CM

## 2013-03-31 DIAGNOSIS — G603 Idiopathic progressive neuropathy: Secondary | ICD-10-CM

## 2013-03-31 DIAGNOSIS — R569 Unspecified convulsions: Secondary | ICD-10-CM

## 2013-03-31 NOTE — Patient Instructions (Signed)
You will have labs drawn today.  You will be scheduled for your EMG that will take 2 hours.   Please schedule your 3 week follow up with Dr. Allena Katz.

## 2013-03-31 NOTE — Progress Notes (Signed)
[facility] Clinic Note - Initial Visit   Date: 04/01/2013   Gerald Leach MRN: [id number] DOB: 1934/04/03   Dear Dr [name]:  Thank you for your kind referral of Gerald Leach for consultation of gait abnormality. Although his history is well known to you, please allow [place] to reiterate it for the purpose of our medical record. The patient was accompanied to the clinic by wife.   History of Present Illness: Gerald Leach is a 79 y.o. year-old Caucasian male with history of hypertension, hyperlipidemia, GERD, DM, CNS aneurysm s/p coiiling ([facility], 2002), and SDH s/p craniotomy (09/2012) presenting for evaluation of gait abnormality.    Patient reports noticing problems with his gait following his SDH.  In April 2014, he sustained a fall and became acutely encephalopathic.  He was taken to the ED where he was found to have left SDH.  He was admitted from 4/3 - 09/30/2012 during which time he underwent evacuation x 2 and placed on seizure prophylaxis with keppra.  He was eventually discharged to rehab facility.  He currently follows with Dr. Riley Kill for TBI.  Since he was doing well, in July 2014, trial of weaning Keppra was performed.  However, on 8/19 he fell over the front porch chair while climbing the stairs and when his wife went to see him, she noticed his arm was shaking.  She reports noticing brief jerking of the left arm the previous evening, too, but it only lasted a few minutes.  There was no associated tongue biting, urinary/bladder incontinence. He has not had any similar spells since then.  Regarding, his gait, he reports noticing problems only after Keppra was starting but he also had a SDH at the same time.  Looking back at Epic notes, it appears that even in 02/2012 he had a fall with severe facial hematoma and fractured T7 vertebra, so it is likely that symptoms have been longer than what he describes.  He had myelogram which shoed high-grade stenosis at T7 and  was evaluated by Dr. Yetta Barre, neurosurgeon.  They advised that a watchful waiting or surgery and he elected the conservative option.  He feels as if his balance is "off" and very unsteady.  He has been walking with a cane since April. He has completed in-patient rehab already for gait problems.   He reports urinary urge incontinence.    Past Medical History  Diagnosis Date  . Hypertension   . Colonic polyp 2003  . GERD (gastroesophageal reflux disease)   . ED (erectile dysfunction)   . Hyperlipidemia   . Hypothyroidism   . Brain aneurysm   . Seizures     per family    Past Surgical History  Procedure Laterality Date  . Brain aneurysm surgery      at [facility]  . Carotid artery aneurysm    . Colonoscopy      polyps  . Right hernia    . Craniotomy Right 09/19/2012    Procedure: CRANIOTOMY HEMATOMA EVACUATION SUBDURAL;  Surgeon: Clydene Fake, MD;  Location: [facility]UR[facility];  Service: Neurosurgery;  Laterality: Right;  . Craniotomy Right 09/22/2012    Procedure: CRANIOTOMY HEMATOMA EVACUATION SUBDURAL;  Surgeon: Clydene Fake, MD;  Location: [facility]UR[facility];  Service: Neurosurgery;  Laterality: Right;     Medications:  Current Outpatient Prescriptions on File Prior to Visit  Medication Sig Dispense Refill  . levETIRAcetam (KEPPRA) 500 MG tablet Take 500 mg by mouth 2 (two) times daily.       Marland Kitchen  levothyroxine (SYNTHROID, LEVOTHROID) 50 MCG tablet Take 1 tablet (50 mcg total) by mouth daily before breakfast.  30 tablet  1  . metFORMIN (GLUCOPHAGE) 500 MG tablet Take 1 tablet (500 mg total) by mouth 2 (two) times daily with a meal.  60 tablet  1  . Multiple Vitamin (MULTIVITAMIN WITH MINERALS) TABS Take 1 tablet by mouth daily.      Marland Kitchen omeprazole (PRILOSEC) 20 MG capsule Take 20 mg by mouth every Monday, Wednesday, and Friday.       . ONE TOUCH LANCETS MISC 1 each by Does not apply route daily.  200 each  0  . simvastatin (ZOCOR) 10 MG tablet Take 1 tablet (10 mg total) by mouth at bedtime.  30  tablet  1   No current facility-administered medications on file prior to visit.    Allergies: No Known Allergies  Family History:  No family history of aneuryms, seizures, of peripheral neuropathy. His parents died of unknown causes at the age of 24. Sister died at 40, unknown cause. Brother died of liver disease.  Social History: History   Social History  . Marital Status: Married    Spouse Name: N/A    Number of Children: N/A  . Years of Education: N/A   Occupational History  . Not on file.   Social History Main Topics  . Smoking status: Never Smoker   . Smokeless tobacco: Never Used  . Alcohol Use: Yes     Comment: rum mixed in diet coke 3 a week  . Drug Use: No  . Sexual Activity: Not on file   Other Topics Concern  . Not on file   Social History Narrative  . No narrative on file    Review of Systems:  CONSTITUTIONAL: No fevers, chills, night sweats, or weight loss.   EYES: No visual changes or eye pain ENT: No hearing changes.  No history of nose bleeds.   RESPIRATORY: No cough, wheezing and shortness of breath.   CARDIOVASCULAR: Negative for chest pain, and palpitations.   GI: Negative for abdominal discomfort, blood in stools or black stools.  No recent change in bowel habits.   GU:  No history of incontinence.   MUSCLOSKELETAL: No history of joint pain or swelling.  No myalgias.   SKIN: Negative for lesions, rash, and itching.   HEMATOLOGY/ONCOLOGY: Negative for prolonged bleeding, bruising easily, and swollen nodes.   ENDOCRINE: Negative for cold or heat intolerance, polydipsia or goiter.   PSYCH:  No depression or anxiety symptoms.   NEURO: As Above.   Vital Signs:  BP 108/62  Pulse 78  Ht 6' (1.829 m)  Wt 188 lb (85.276 kg)  BMI 25.49 kg/m2  Neurological Exam: MENTAL STATUS including orientation to time, place, person, recent and remote memory, attention span and concentration, language, and fund of knowledge is fair.  Speech is not  dysarthric.  CRANIAL NERVES: II:  No visual field defects.  Unremarkable fundi.   III-IV-VI: Pupils equal round and reactive to light.  Restricted upward gaze bilaterally, otherwise normal conjugate, extra-ocular eye movements in all directions of gaze.  No nystagmus.  No ptosis.   V:  Normal facial sensation.  Jaw jerk is absent.   VII:  Normal facial symmetry and movements.  Myerson's sign present, otherwise no pathologic facial reflexes.  VIII:  Normal hearing and vestibular function.   IX-X:  Normal palatal movement.   XI:  Normal shoulder shrug and head rotation.   XII:  Normal tongue strength  and range of motion, no deviation or fasciculation.  MOTOR:  Intrinsic hand atrophy bilaterally. No fasciculations or abnormal movements.  No pronator drift.    Right Upper Extremity:    Left Upper Extremity:    Deltoid  5/5   Deltoid  5/5   Biceps  5/5   Biceps  5/5   Triceps  5/5   Triceps  5/5   Wrist extensors  5/5   Wrist extensors  5/5   Wrist flexors  5/5   Wrist flexors  5/5   Finger extensors  5/5   Finger extensors  5/5   Finger flexors  5/5   Finger flexors  5/5   Dorsal interossei  4/5   Dorsal interossei  4/5   Abductor pollicis  5-/5   Abductor pollicis  5-/5   Tone (Ashworth scale)  0  Tone (Ashworth scale)  0   Right Lower Extremity:    Left Lower Extremity:    Hip flexors  5-/5   Hip flexors  5-/5   Hip extensors  5/5   Hip extensors  5/5   Knee flexors  5/5   Knee flexors  5/5   Knee extensors  5/5   Knee extensors  5/5   Dorsiflexors  5-/5   Dorsiflexors  5/5   Plantarflexors  5/5   Plantarflexors  5/5   Toe extensors  5-/5   Toe extensors  5/5   Toe flexors  5/5   Toe flexors  5/5   Tone (Ashworth scale)  0  Tone (Ashworth scale)  0   MSRs:  Right                                                                 Left brachioradialis 2+  brachioradialis 2+  biceps 2+  biceps 2+  triceps 2+  triceps 2+  patellar 3+  patellar 3+  ankle jerk 0  ankle jerk 0   Hoffman no  Hoffman no  plantar response down  plantar response down   SENSORY:  Vibration reduced distal to knees and absent at ankles bilaterally.  Proprioception is impaired at the great toe bilaterally, inconsistent at the DIP, and intact at the ankles bilaterally.   Normal and symmetric perception of light touch, pin prick, and temperature. Romberg's sign present.   COORDINATION/GAIT: Normal finger-to- nose-finger and heel-to-shin.  Intact rapid alternating movements bilaterally.  Gait is wide-based with small steps.  He is unable to perform tandem and stressed gait intact.  There is moderate instability when he walks and he frequently would reach reach out to lean on exam table for support.  Data: Thoracic myelogram 04/29/2012: high-grade central stenosis at T7 associated with the vertebral plana compression fracture of retropulsed bone. CT cervical spine 02/03/2013: Findings: There is no acute fracture or listhesis within the cervical spine. Normal C1-2 articulations are intact.  Prevertebral soft tissues are normal.  Multilevel degenerative disc disease is seen throughout the cervical spine. Prominent bridging anterior osteophytes is present at the C2-3 level anteriorly. Multilevel facet arthropathy is present.  The partially visualized lung apices are clear.  IMPRESSION:  1. No CT evidence of acute traumatic injury.  2. Multilevel degenerative disc disease and facet arthrosis.  CT brain 02/03/2013: 1. No acute intracranial process.  2. Stable ventricular enlargement.  3. Stable appearance of partially calcified right supraclinoid mass.    Component     Latest Ref Rng 03/24/2013  Hemoglobin A1C     4.6 - 6.5 % 6.6 (H)  TSH     0.35 - 5.50 uIU/mL 3.65    IMPRESSION: Mr. Hausman is a 79 year-old male with history of CNS aneurysm s/p coiling at [facility] (2002), subdural hemorrhage s/p craniotomy for evacuation (09/2012) following a fall, HTN, hypothyroidism, DM (HbA1c 6.6) and GERD  presenting for evaluation of seizures and gait abnormality.  It appears that he was placed on Keppra 500mg  BID as seizure prophylaxis while he was hospitalized in April and as it was attempted to be weaned, he has a seizure-like spell.  He is currently back on Keppra 500mg  BID.  Patient is concerned that it is causing his gait problems, but based on my exam today, he has evidence of large fiber generalized sensory polyneuropathy with significant deficits in proprioception.  He also has known thoracic spinal stenosis which may be contributing to his gait, but myelopathic features on his exam are not as prominent (brisk patella jerks).   I would favor that his gait problems are due to neuropathy, more so than the medication.  I will check for treatable causes of neuropathy and obtain an EMG to better characterize and assess the severity of his symptoms.     PLAN/RECOMMENDATIONS:  1. Check vitamin B12, MMA, copper, ceruloplasmin, protein electrophoresis with IFE 2. EMG - peripheral neuropathy protocol 3. Continue Keppra 500mg  BID for now 4. Fall precautions discussed  5. Return to clinic in 2-3 weeks  The duration of this appointment visit was 60 minutes of face-to-face time with the patient.  Greater than 50% of this time was spent in counseling, explanation of diagnosis, planning of further management, and coordination of care.   Thank you for allowing me to participate in patient's care.  If I can answer any additional questions, I would be pleased to do so.    Sincerely,    Kenise Barraco K. Allena Katz, DO

## 2013-04-01 ENCOUNTER — Encounter: Payer: Self-pay | Admitting: Neurology

## 2013-04-01 LAB — VITAMIN B12: Vitamin B-12: 176 pg/mL — ABNORMAL LOW (ref 211–911)

## 2013-04-02 LAB — UIFE/LIGHT CHAINS/TP QN, 24-HR UR
Albumin, U: DETECTED
Beta, Urine: DETECTED — AB
Free Kappa Lt Chains,Ur: 6.66 mg/dL — ABNORMAL HIGH (ref 0.14–2.42)
Free Lambda Lt Chains,Ur: 0.74 mg/dL — ABNORMAL HIGH (ref 0.02–0.67)
Gamma Globulin, Urine: DETECTED — AB

## 2013-04-02 LAB — IMMUNOFIXATION ELECTROPHORESIS
IgG (Immunoglobin G), Serum: 1100 mg/dL (ref 650–1600)
IgM, Serum: 70 mg/dL (ref 41–251)
Total Protein, Serum Electrophoresis: 7.4 g/dL (ref 6.0–8.3)

## 2013-04-02 LAB — METHYLMALONIC ACID, SERUM: Methylmalonic Acid, Quant: 0.18 umol/L (ref <0.40)

## 2013-04-02 LAB — COPPER, SERUM: Copper: 109 ug/dL (ref 70–175)

## 2013-04-03 LAB — PROTEIN ELECTROPHORESIS
A/G Ratio: 1.2 (ref 0.7–2.0)
Albumin ELP: 4.1 g/dL (ref 3.2–5.6)
Beta: 1.1 g/dL (ref 0.6–1.3)
Gamma Globulin: 1.2 g/dL (ref 0.5–1.6)

## 2013-04-06 ENCOUNTER — Telehealth: Payer: Self-pay

## 2013-04-06 NOTE — Telephone Encounter (Signed)
Left message for pt to call back to discuss lab results.

## 2013-04-07 ENCOUNTER — Telehealth: Payer: Self-pay

## 2013-04-07 NOTE — Telephone Encounter (Signed)
Message copied by Lou Cal on Tue Apr 07, 2013  2:34 PM ------      Message from: Nita Sickle K      Created: Fri Apr 03, 2013  2:51 PM       Please notify patient that his vitamin B12 is low, and may be contributing to his gait problems. I do recommend vitamin B12 supplementation as follows:  1000 mcg daily x1 week, 1000 mcg weekly x 4 weeks, then 1000 mcg monthly x1 year.  He may come to our the office/PCP to have this administered, whichever is more convenient.  Thanks.            Donika K. Allena Katz, DO             ------

## 2013-04-07 NOTE — Telephone Encounter (Signed)
Called pt and relayed your message, he has agreed to start B12 injections.

## 2013-04-07 NOTE — Telephone Encounter (Signed)
Left message for pt to call back to discuss lab results.

## 2013-04-08 ENCOUNTER — Ambulatory Visit: Payer: Medicare Other

## 2013-04-08 ENCOUNTER — Other Ambulatory Visit: Payer: Self-pay | Admitting: Family Medicine

## 2013-04-09 ENCOUNTER — Ambulatory Visit: Payer: Medicare Other

## 2013-04-10 ENCOUNTER — Other Ambulatory Visit: Payer: Self-pay | Admitting: Physical Medicine & Rehabilitation

## 2013-04-10 ENCOUNTER — Ambulatory Visit: Payer: Medicare Other

## 2013-04-13 ENCOUNTER — Ambulatory Visit (INDEPENDENT_AMBULATORY_CARE_PROVIDER_SITE_OTHER): Payer: Medicare Other | Admitting: Neurology

## 2013-04-13 ENCOUNTER — Encounter: Payer: Self-pay | Admitting: Neurology

## 2013-04-13 ENCOUNTER — Ambulatory Visit: Payer: Medicare Other

## 2013-04-13 DIAGNOSIS — IMO0002 Reserved for concepts with insufficient information to code with codable children: Secondary | ICD-10-CM

## 2013-04-13 DIAGNOSIS — M5417 Radiculopathy, lumbosacral region: Secondary | ICD-10-CM

## 2013-04-13 DIAGNOSIS — G5602 Carpal tunnel syndrome, left upper limb: Secondary | ICD-10-CM

## 2013-04-13 DIAGNOSIS — R269 Unspecified abnormalities of gait and mobility: Secondary | ICD-10-CM

## 2013-04-13 DIAGNOSIS — G5622 Lesion of ulnar nerve, left upper limb: Secondary | ICD-10-CM

## 2013-04-13 DIAGNOSIS — G5620 Lesion of ulnar nerve, unspecified upper limb: Secondary | ICD-10-CM

## 2013-04-13 DIAGNOSIS — G609 Hereditary and idiopathic neuropathy, unspecified: Secondary | ICD-10-CM

## 2013-04-13 DIAGNOSIS — G5600 Carpal tunnel syndrome, unspecified upper limb: Secondary | ICD-10-CM

## 2013-04-13 DIAGNOSIS — E538 Deficiency of other specified B group vitamins: Secondary | ICD-10-CM

## 2013-04-13 DIAGNOSIS — G629 Polyneuropathy, unspecified: Secondary | ICD-10-CM

## 2013-04-13 MED ORDER — CYANOCOBALAMIN 1000 MCG/ML IJ SOLN: 1000.0000 ug | Freq: Once | INTRAMUSCULAR | Status: DC

## 2013-04-13 MED ORDER — CYANOCOBALAMIN 1000 MCG/ML IJ SOLN
1000.0000 ug | Freq: Once | INTRAMUSCULAR | Status: AC
  Administered 2013-04-13: 1000 ug via INTRAMUSCULAR

## 2013-04-13 NOTE — Procedures (Addendum)
[facility]  [address] [place], [address]  [place], [place] [address] Tel: [phone] Fax:  [phone] Test Date:  04/13/2013  Patient: Gerald Leach DOB: 14-Oct-1933 Physician: Nita Sickle, DO  Sex: Male Height: 6\' 1"  Ref Phys: Nita Sickle, DO  ID#: [id number] Temp: 31.0C Technician:    Patient Complaints: This is a 79 year-old man presenting for evaluation of gait instability, loss of balance, and numbness of his feet.  NCV & EMG Findings: Extensive evaluation of the left upper and lower extremity reveals the following:  1. Absent sural and superficial peroneal responses. 2. Prolonged median sensory response with preserved amplitude. Normal ulnar and radial sensory responses. 3. Absent tibial motor response recorded at the abductor hallucis brevis. The peroneal motor response recorded at the extensor digitorum brevis and tibialis anterior is reduced. 4. Median motor response is normal. The ulnar motor response has preserved amplitude, but there is slowing across the elbow. 5. Chronic motor axonal loss changes are seen in all of the muscles below the knee with sparse changes seen in the biceps femoris short head and gluteus medius. There is no evidence of active denervation. 6. Needle electrode examination of the upper extremity discloses very mild chronic changes isolated to the abductor pollicis brevis.  Impression: 1. Generalized large fiber sensorimotor polyneuropathy affecting the left side; moderate in degree electrically. A mild superimposed L5-S1 radiculopathy cannot be excluded. 2. Left median neuropathy at or distal to the wrist consistent with the clinical diagnosis of carpal tunnel syndrome, overall these changes are mild-moderate in degree electrically. 3. Mild left ulnar neuropathy at the elbow with purely demyelinating features evidenced as conduction velocity slowing.    ___________________________ Nita Sickle, DO    Nerve Conduction Studies Anti  Sensory Summary Table   Site NR Peak (ms) Norm Peak (ms) P-T Amp (V) Norm P-T Amp  Left Median Anti Sensory (2nd Digit)  Wrist    4.3 <3.8 27.5 >10  Left Radial Anti Sensory (Base 1st Digit)  Wrist    2.3 <2.8 16.3 >10  Left Sup Peroneal Anti Sensory (Ant Lat Mall)  12 cm NR  <4.6  >3  Left Sural Anti Sensory (Lat Mall)  Calf NR  <4.6  >3  Left Ulnar Anti Sensory (5th Digit)  Wrist    3.2 <3.2 9.7 >5   Motor Summary Table   Site NR Onset (ms) Norm Onset (ms) O-P Amp (mV) Norm O-P Amp Site1 Site2 Delta-0 (ms) Dist (cm) Vel (m/s) Norm Vel (m/s)  Left Median Motor (Abd Poll Brev)  Wrist    3.7 <4.0 5.9 >5 Elbow Wrist 6.9 36.0 52 >50  Elbow    10.6  5.9         Left Peroneal Motor (Ext Dig Brev)  Ankle    4.2 <6.0 2.2 >2.5 B Fib Ankle 11.1 44.0 40 >40  B Fib    15.3  1.5  Poplt B Fib 1.2 7.0 58 >40  Poplt    16.5  1.1         Post-exercise    4.1  2.2         Left Peroneal TA Motor (Tib Ant)  Fib Head    4.5 <4.5 2.5 >3 Poplit Fib Head 1.5 9.0 60 >40  Poplit    6.4  2.5         Left Tibial Motor (Abd Hall Brev)  Ankle NR  <6.0  >4 Knee Ankle  0.0  >40  Knee NR  Left Ulnar Motor (Abd Dig Minimi)  Wrist    2.8 <3.1 10.1 >7 B Elbow Wrist 4.9 26.0 53 >50  B Elbow    7.7  9.5  A Elbow B Elbow 2.5 10.0 40 >50  A Elbow    10.2  8.2          H Reflex Studies   NR H-Lat (ms) Lat Norm (ms) L-R H-Lat (ms)  Left Tibial (Gastroc)  NR  <35   Right Tibial (Gastroc)  NR  <35    EMG   Side Muscle Ins Act Fibs Psw Fasc Number Recrt Dur Dur. Amp Amp. Poly Poly. Comment  Left AntTibialis Nml Nml Nml Nml 2- Rapid Many 1+ Many 1+ Many 1+ N/A  Left Gastroc Nml Nml Nml Nml 1- Mod-R Few 1+ Nml Nml Nml Nml N/A  Left Flex Dig Long Nml Nml Nml Nml 2- Rapid Many 1+ Nml Nml Nml Nml N/A  Left Ext Dig Brev Nml Nml Nml Nml 2- Rapid Many 1+ Nml Nml Nml Nml N/A  Left AbdHallucis Nml Nml Nml Nml 2- Rapid Many 1+ Nml Nml Nml Nml N/A  Left RectFemoris Nml Nml Nml Nml 1- Mod-V Nml Nml Nml Nml Nml  Nml N/A  Left 1stDorInt Nml Nml Nml Nml Nml Nml Nml Nml Nml Nml Nml Nml N/A  Left Abd Poll Brev Nml Nml Nml Nml 1- Mod Few 1+ Nml Nml Nml Nml N/A  Left FlexPolLong Nml Nml Nml Nml Nml Nml Nml Nml Nml Nml Nml Nml N/A  Left PronatorTeres Nml Nml Nml Nml Nml Nml Nml Nml Nml Nml Nml Nml N/A  Left Ext Indicis Nml Nml Nml Nml Nml Nml Nml Nml Nml Nml Nml Nml N/A  Left Triceps Nml Nml Nml Nml Nml Nml Nml Nml Nml Nml Nml Nml N/A  Left GluteusMed Nml Nml Nml Nml 1- Mod Few 1+ Nml Nml Nml Nml N/A  Left BicepsFemS Nml Nml Nml Nml 1- Mod Few 1+ Nml Nml Nml Nml N/A  Left FlexDigProf 4,5 Nml Nml Nml Nml Nml Nml Nml Nml Nml Nml Nml Nml N/A     Waveforms:

## 2013-04-13 NOTE — Progress Notes (Signed)
See procedure note for EMG results.  Eisha Chatterjee K. Lavina Resor, DO  

## 2013-04-14 ENCOUNTER — Ambulatory Visit: Payer: Medicare Other

## 2013-04-15 ENCOUNTER — Encounter (HOSPITAL_COMMUNITY): Payer: Self-pay | Admitting: Emergency Medicine

## 2013-04-15 ENCOUNTER — Emergency Department (HOSPITAL_COMMUNITY)
Admission: EM | Admit: 2013-04-15 | Discharge: 2013-04-15 | Disposition: A | Discharge status: Home / Self Care | Payer: Medicare Other | Attending: Emergency Medicine | Admitting: Emergency Medicine

## 2013-04-15 ENCOUNTER — Emergency Department (HOSPITAL_COMMUNITY): Payer: Medicare Other

## 2013-04-15 ENCOUNTER — Other Ambulatory Visit: Payer: Self-pay | Admitting: Physical Medicine & Rehabilitation

## 2013-04-15 DIAGNOSIS — K219 Gastro-esophageal reflux disease without esophagitis: Secondary | ICD-10-CM | POA: Insufficient documentation

## 2013-04-15 DIAGNOSIS — E039 Hypothyroidism, unspecified: Secondary | ICD-10-CM | POA: Insufficient documentation

## 2013-04-15 DIAGNOSIS — R569 Unspecified convulsions: Secondary | ICD-10-CM

## 2013-04-15 DIAGNOSIS — Z79899 Other long term (current) drug therapy: Secondary | ICD-10-CM | POA: Insufficient documentation

## 2013-04-15 DIAGNOSIS — Z87448 Personal history of other diseases of urinary system: Secondary | ICD-10-CM | POA: Insufficient documentation

## 2013-04-15 DIAGNOSIS — G40909 Epilepsy, unspecified, not intractable, without status epilepticus: Secondary | ICD-10-CM | POA: Insufficient documentation

## 2013-04-15 DIAGNOSIS — E785 Hyperlipidemia, unspecified: Secondary | ICD-10-CM | POA: Insufficient documentation

## 2013-04-15 DIAGNOSIS — I10 Essential (primary) hypertension: Secondary | ICD-10-CM | POA: Insufficient documentation

## 2013-04-15 DIAGNOSIS — R269 Unspecified abnormalities of gait and mobility: Secondary | ICD-10-CM | POA: Insufficient documentation

## 2013-04-15 DIAGNOSIS — Z860100 Personal history of colon polyps, unspecified: Secondary | ICD-10-CM | POA: Insufficient documentation

## 2013-04-15 DIAGNOSIS — Z87820 Personal history of traumatic brain injury: Secondary | ICD-10-CM | POA: Insufficient documentation

## 2013-04-15 DIAGNOSIS — Z9181 History of falling: Secondary | ICD-10-CM | POA: Insufficient documentation

## 2013-04-15 LAB — CBC WITH DIFFERENTIAL/PLATELET
Basophils Absolute: 0 10*3/uL (ref 0.0–0.1)
Basophils Relative: 0 % (ref 0–1)
Eosinophils Absolute: 0 K/uL (ref 0.0–0.7)
Eosinophils Relative: 0 % (ref 0–5)
HCT: 41 % (ref 39.0–52.0)
Hemoglobin: 13.9 g/dL (ref 13.0–17.0)
Lymphocytes Relative: 26 % (ref 12–46)
Lymphs Abs: 2.2 K/uL (ref 0.7–4.0)
MCH: 27.9 pg (ref 26.0–34.0)
MCHC: 33.9 g/dL (ref 30.0–36.0)
MCV: 82.3 fL (ref 78.0–100.0)
Monocytes Absolute: 0.9 10*3/uL (ref 0.1–1.0)
Monocytes Relative: 11 % (ref 3–12)
Neutro Abs: 5.5 10*3/uL (ref 1.7–7.7)
Neutrophils Relative %: 64 % (ref 43–77)
Platelets: 239 10*3/uL (ref 150–400)
RBC: 4.98 MIL/uL (ref 4.22–5.81)
RDW: 15 % (ref 11.5–15.5)
WBC: 8.6 10*3/uL (ref 4.0–10.5)

## 2013-04-15 LAB — URINALYSIS, ROUTINE W REFLEX MICROSCOPIC
Bilirubin Urine: NEGATIVE
Glucose, UA: NEGATIVE mg/dL
Hgb urine dipstick: NEGATIVE
Ketones, ur: NEGATIVE mg/dL
Leukocytes, UA: NEGATIVE
Nitrite: NEGATIVE
Protein, ur: NEGATIVE mg/dL
Specific Gravity, Urine: 1.021 (ref 1.005–1.030)
Urobilinogen, UA: 0.2 mg/dL (ref 0.0–1.0)
pH: 6 (ref 5.0–8.0)

## 2013-04-15 LAB — BASIC METABOLIC PANEL WITH GFR
BUN: 17 mg/dL (ref 6–23)
Creatinine, Ser: 0.76 mg/dL (ref 0.50–1.35)
GFR calc non Af Amer: 86 mL/min — ABNORMAL LOW (ref >90)
Glucose, Bld: 133 mg/dL — ABNORMAL HIGH (ref 70–99)

## 2013-04-15 LAB — PROTIME-INR
INR: 0.99 (ref 0.00–1.49)
Prothrombin Time: 12.9 seconds (ref 11.6–15.2)

## 2013-04-15 LAB — BASIC METABOLIC PANEL
CO2: 27 mEq/L (ref 19–32)
Calcium: 9.1 mg/dL (ref 8.4–10.5)
Chloride: 101 mEq/L (ref 96–112)
GFR calc Af Amer: >90 mL/min (ref >90)
Potassium: 4.3 mEq/L (ref 3.5–5.1)
Sodium: 139 mEq/L (ref 135–145)

## 2013-04-15 LAB — APTT: aPTT: 38 seconds — ABNORMAL HIGH (ref 24–37)

## 2013-04-15 MED ORDER — LEVETIRACETAM 1000 MG PO TABS: 1000.0000 mg | ORAL_TABLET | Freq: Two times a day (BID) | ORAL | Status: DC

## 2013-04-15 MED ORDER — SODIUM CHLORIDE 0.9 % IV SOLN
1000.0000 mg | Freq: Once | INTRAVENOUS | Status: AC
  Administered 2013-04-15: 1000 mg via INTRAVENOUS
  Filled 2013-04-15: qty 10

## 2013-04-15 NOTE — ED Notes (Signed)
Per EMS: pt was downtown when he had a seizure witnessed by bystanders and was assisted to the ground. EMS reports he had grand mal seizure en route. No obvious injuries noted.  No memory of seizure. A&O x4. cbg 130. Denies HA, weakness, malaise. Pt may have had brain surgery in January 2014.

## 2013-04-15 NOTE — ED Provider Notes (Signed)
CSN: [id number]     Arrival date & time 04/15/13  1556 History   First MD Initiated Contact with Patient 04/15/13 1559     Chief Complaint  Patient presents with  . Seizures   (Consider location/radiation/quality/duration/timing/severity/associated sxs/prior Treatment) HPI Comments: Patient with prior history of aneurysm coiling at [facility] in 2002. In April of this year, the patient had a fall with head injury and developed a subdural hematoma which had to be evacuated on 2 separate occasions. After placement and rehabilitation, the patient had been put on Keppra twice daily for preventative purposes. After his dosage was weaned, the patient had a seizure apparently in August of this year and his Keppra was reinstated at his original dose. No further episodes since then. However that I see from reviewing prior records, the patient is being evaluated for gait disturbance. 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 Surgical options do not seem to have been entertained seriously at this point. Patient was at an office area downtown today, had an episode of falling. The patient denies head injury at that time. According to acquaintance is her new him in that area, the patient was not acting normal. Patient's spouse and EMS were called. Patient's spouse was planning on bringing the patient to the emergency department and EMS was assisting, while in their care the patient had a witnessed grand mal seizure. No incontinence, no oral trauma. Patient has no recollection of the episode. Patient currently reports he feels back to his usual baseline. He denies any fevers, cough, vomiting or diarrhea or any other acute health symptoms.  Patient is a 79 y.o. male presenting with seizures. The history is provided by the patient, the spouse and medical  records.  Seizures Seizure activity on arrival: no   Seizure type:  Grand mal Episode characteristics: combativeness, confusion, generalized shaking and stiffening   Postictal symptoms: confusion and memory loss   Severity:  Moderate Number of seizures this episode:  2 Recent head injury:  No recent head injuries PTA treatment:  None History of seizures: yes     Past Medical History  Diagnosis Date  . Hypertension   . Colonic polyp 2003  . GERD (gastroesophageal reflux disease)   . ED (erectile dysfunction)   . Hyperlipidemia   . Hypothyroidism   . Brain aneurysm   . Seizures     per family   Past Surgical History  Procedure Laterality Date  . Brain aneurysm surgery      at [facility]  . Carotid artery aneurysm    . Colonoscopy      polyps  . Right hernia    . Craniotomy Right 09/19/2012    Procedure: CRANIOTOMY HEMATOMA EVACUATION SUBDURAL;  Surgeon: Clydene Fake, MD;  Location: [facility];  Service: Neurosurgery;  Laterality: Right;  . Craniotomy Right 09/22/2012    Procedure: CRANIOTOMY HEMATOMA EVACUATION SUBDURAL;  Surgeon: Clydene Fake, MD;  Location: [facility];  Service: Neurosurgery;  Laterality: Right;   Family History  Problem Relation Age of Onset  . Liver disease Brother     Died   History  Substance Use Topics  . Smoking status: Never Smoker   . Smokeless tobacco: Never Used  . Alcohol Use: Yes     Comment: rum mixed in diet coke 3 a week  Review of Systems  Constitutional: Negative for fever and chills.  HENT: Negative for congestion and rhinorrhea.   Respiratory: Negative for cough and shortness of breath.   Cardiovascular: Negative for chest pain.  Gastrointestinal: Negative for nausea, vomiting and abdominal pain.  Musculoskeletal: Positive for gait problem.  Neurological: Positive for seizures.  All other systems reviewed and are negative.    Allergies  Review of patient's allergies indicates no known allergies.  Home  Medications   Current Outpatient Rx  Name  Route  Sig  Dispense  Refill  . levETIRAcetam (KEPPRA) 500 MG tablet   Oral   Take 500 mg by mouth 2 (two) times daily.          Marland Kitchen levothyroxine (SYNTHROID, LEVOTHROID) 50 MCG tablet   Oral   Take 1 tablet (50 mcg total) by mouth daily before breakfast.   30 tablet   1   . metFORMIN (GLUCOPHAGE) 500 MG tablet   Oral   Take 1 tablet (500 mg total) by mouth 2 (two) times daily with a meal.   60 tablet   1   . Multiple Vitamin (MULTIVITAMIN WITH MINERALS) TABS   Oral   Take 1 tablet by mouth daily.         Marland Kitchen omeprazole (PRILOSEC) 20 MG capsule   Oral   Take 20 mg by mouth every Monday, Wednesday, and Friday.          . simvastatin (ZOCOR) 10 MG tablet   Oral   Take 1 tablet (10 mg total) by mouth at bedtime.   30 tablet   1   . levETIRAcetam (KEPPRA) 1000 MG tablet   Oral   Take 1 tablet (1,000 mg total) by mouth 2 (two) times daily.   60 tablet   0   . ONE TOUCH LANCETS MISC   Does not apply   1 each by Does not apply route daily.   200 each   0     DM II - 250.00    BP 149/94  Pulse 76  Temp(Src) 97.9 F (36.6 C) (Oral)  Resp 23  SpO2 98% Physical Exam  Nursing note and vitals reviewed. Constitutional: He appears well-developed and well-nourished.  HENT:  Head: Normocephalic and atraumatic.  Eyes: Conjunctivae and EOM are normal. No scleral icterus.  Neck: Normal range of motion. Neck supple.  Cardiovascular: Normal rate, regular rhythm and intact distal pulses.   No murmur heard. Pulmonary/Chest: Effort normal. No respiratory distress. He has no wheezes.  Abdominal: Soft. He exhibits no distension. There is no tenderness. There is no guarding.  Neurological: He is alert. He displays no tremor. He exhibits normal muscle tone. He displays no seizure activity. Coordination normal. GCS eye subscore is 4. GCS verbal subscore is 5. GCS motor subscore is 6.  Skin: Skin is warm. No rash noted.  Psychiatric:  He has a normal mood and affect.    ED Course  Procedures (including critical care time) Labs Review Labs Reviewed  BASIC METABOLIC PANEL - Abnormal; Notable for the following:    Glucose, Bld 133 (*)    GFR calc non Af Amer 86 (*)    All other components within normal limits  APTT - Abnormal; Notable for the following:    aPTT 38 (*)    All other components within normal limits  CBC WITH DIFFERENTIAL  URINALYSIS, ROUTINE W REFLEX MICROSCOPIC  PROTIME-INR   Imaging Review Ct Head Wo Contrast  04/15/2013   CLINICAL DATA:  Seizure.  Craniotomy April 2014 for hematoma evacuation.  EXAM: CT HEAD WITHOUT CONTRAST  TECHNIQUE: Contiguous axial images were obtained from the base of the skull through the vertex without intravenous contrast.  COMPARISON:  Several prior exams most recent 02/03/2013.  FINDINGS: Prior right frontal craniotomy for drainage of a right-sided subdural hematoma. Portion of brain extends towards the posterior right burr-hole.  No acute skull fracture or intracranial hemorrhage.  Complex calcified mass arising from the right clinoid measures up to 2.4 cm and is suggestive of a meningioma given the aeration of the right clinoid. Calcified aneurysm is a secondary less likely consideration. MR would prove helpful for further delineation if possible.  Prominent small vessel disease type changes. No CT evidence of large acute infarct.  Atrophy with ventricular prominence raising possibility of mild hydrocephalus.  IMPRESSION: Prior right frontal craniotomy for drainage of a right-sided subdural hematoma. Portion of brain extends towards the posterior right burr-hole.  No acute skull fracture or intracranial hemorrhage.  Complex calcified mass arising from the right clinoid measures up to 2.4 cm and is suggestive of a meningioma given the aeration of the right clinoid. Calcified aneurysm is a secondary less likely consideration. MR would prove helpful for further delineation if possible.   Prominent small vessel disease type changes. No CT evidence of large acute infarct.  Atrophy with ventricular prominence raising possibility of mild hydrocephalus.   Electronically Signed   By: Bridgett Larsson M.D.   On: 04/15/2013 17:12    EKG Interpretation     Ventricular Rate:  81 PR Interval:  172 QRS Duration: 93 QT Interval:  369 QTC Calculation: 428 R Axis:   -28 Text Interpretation:  Sinus rhythm Borderline left axis deviation Abnormal R-wave progression, late transition Baseline wander in lead(s) V3 bigeminy pattern no longer seen.   poor r wave progression seen previously No significant change since last tracing           Room air saturation is 97% and I interpret this to be adequate.    6:13 PM No further seizure activity in the ED.  Head CT shows some chronic changes, possible new meningioma.  Will discuss with neuro hospitalist.  7:13 PM Discussed with Dr. Roseanne Reno, discussed CT of head findings, he recommends dose of 1 g of Keppra, then increase dose to 1000 mg BID at home.       MDM   1. Seizure      Patient with witnessed grand mal seizure by EMS. Patient is back on his usual Keppra dose of 500 mg twice a day. Plan is to get a head CT to ensure no recurrent subdural has accumulated. Otherwise I suspect Keppra dosage needs to be increased and would plan on consulting with neurology for further recommendations. In the meantime we'll check electrolytes, urinalysis and continue to monitor here for any recurrent seizure activity.    Gavin Pound. Oletta Lamas, MD 04/15/13 0981

## 2013-04-15 NOTE — Discharge Instructions (Signed)
Seizure, Adult A seizure is abnormal electrical activity in the brain. Seizures can cause a change in attention or behavior (altered mental status). Seizures often involve uncontrollable shaking (convulsions). Seizures usually last from 30 seconds to 2 minutes. Epilepsy is a brain disorder in which a patient has repeated seizures over time. CAUSES  There are many different problems that can cause seizures. In some cases, no cause is found. Common causes of seizures include:  Head injuries.  Brain tumors.  Infections.  Imbalance of chemicals in the blood.  Kidney failure or liver failure.  Heart disease.  Drug abuse.  Stroke.  Withdrawal from certain drugs or alcohol.  Birth defects.  Malfunction of a neurosurgical device placed in the brain. SYMPTOMS  Symptoms vary depending on the part of the brain that is involved. Right before a seizure, you may have a warning (aura) that a seizure is about to occur. An aura may include the following symptoms:   Fear or anxiety.  Nausea.  Feeling like the room is spinning (vertigo).  Vision changes, such as seeing flashing lights or spots. Common symptoms during a seizure include:  Convulsions.  Drooling.  Rapid eye movements.  Grunting.  Loss of bladder and bowel control.  Bitter taste in the mouth. After a seizure, you may feel confused and sleepy. You may also have an injury resulting from convulsions during the seizure. DIAGNOSIS  Your caregiver will perform a physical exam and run some tests to determine the type and cause of your seizure. These tests may include:  Blood tests.  A lumbar puncture test. In this test, a small amount of fluid is removed from the spine and examined.  Electrocardiography (ECG). This test records the electrical activity in your heart.  Imaging tests, such as computed tomography (CT) scans or magnetic resonance imaging (MRI).  Electroencephalography (EEG). This test records the electrical  activity in your brain. TREATMENT  Seizures usually stop on their own. Treatment will depend on the cause of your seizure. In some cases, medicine may be given to prevent future seizures. HOME CARE INSTRUCTIONS   If you are given medicines, take them exactly as prescribed by your caregiver.  Keep all follow-up appointments as directed by your caregiver.  Do not swim or drive until your caregiver says it is okay.  Teach friends and family what to do if you have a seizure. They should:  Lay you on the ground to prevent a fall.  Put a cushion under your head.  Loosen any tight clothing around your neck.  Turn you on your side. If vomiting occurs, this helps keep your airway clear.  Stay with you until you recover. SEEK IMMEDIATE MEDICAL CARE IF:  The seizure lasts longer than 2 to 5 minutes.  The seizure is severe or the person does not wake up after the seizure.  The person has altered mental status. Drive the person to the emergency department or call your local emergency services (911 in [place] MAKE SURE YOU:  Understand these instructions.  Will watch your condition.  Will get help right away if you are not doing well or get worse. Document Released: 06/01/2000 Document Revised: 08/27/2011 Document Reviewed: 05/23/2011 Exit[employer] Patient Information 2014 [employer].  

## 2013-04-16 ENCOUNTER — Telehealth: Payer: Self-pay | Admitting: Neurology

## 2013-04-16 ENCOUNTER — Other Ambulatory Visit: Payer: Self-pay | Admitting: Family Medicine

## 2013-04-16 NOTE — Telephone Encounter (Signed)
Called patient and his son [name]) to see how Mr. Couey was doing as he was recently at the emergency department for breakthroughs seizures, but there was no answer on either home line or cell phone. Message was left.  Patient went to the hospital on 04/15/2013 with seizures and Keppra was increased to 1 g twice daily. I will try contacting them again tomorrow.  Kain Milosevic K. Allena Katz, DO

## 2013-04-17 ENCOUNTER — Other Ambulatory Visit: Payer: Self-pay

## 2013-04-17 ENCOUNTER — Telehealth: Payer: Self-pay | Admitting: Neurology

## 2013-04-17 ENCOUNTER — Telehealth: Payer: Self-pay

## 2013-04-17 DIAGNOSIS — R569 Unspecified convulsions: Secondary | ICD-10-CM

## 2013-04-17 NOTE — Telephone Encounter (Signed)
Called pt and spoke with his wife informing them of his EEG appt. on Wednesday November 12th at 9:15am. They are now aware of appt.

## 2013-04-17 NOTE — Telephone Encounter (Signed)
Called patient's son who reports patient was recently at the hospital with break through seizures.  He says that his father has been having urinary incontinence for the past 5 nights. He had 2 brief seizures yesterday lasting less than 1 minute.  Will plan on getting EEG and consider starting vimpat if seizures do not improve.  Srija Southard K. Allena Katz, DO

## 2013-04-21 ENCOUNTER — Encounter: Payer: Self-pay | Admitting: Neurology

## 2013-04-21 ENCOUNTER — Ambulatory Visit (INDEPENDENT_AMBULATORY_CARE_PROVIDER_SITE_OTHER): Payer: Medicare Other | Admitting: Neurology

## 2013-04-21 VITALS — BP 130/86 | HR 60 | Temp 97.5°F | Ht 70.0 in | Wt 190.7 lb

## 2013-04-21 DIAGNOSIS — G40909 Epilepsy, unspecified, not intractable, without status epilepticus: Secondary | ICD-10-CM

## 2013-04-21 DIAGNOSIS — R569 Unspecified convulsions: Secondary | ICD-10-CM

## 2013-04-21 NOTE — Progress Notes (Signed)
Attempted to notify patient's wife of scheduled MRI/MRA of head for 05/01/13 at [facility] [address]  Unable to comprehend & verbalize understanding.  We both agreed to clarify at patient's appointment in this office tomorrow.

## 2013-04-21 NOTE — Patient Instructions (Addendum)
1.  Take Keppra 1000mg  twice daily 2.  Come to office to get vitamin B12 injections daily x 5 days, then weekly x 1 month, then monthly 3.  EEG on Wednesday November 12th at 9:15am 4.  MRI brain wo contrast and MRA head 5.  Return to clinic in 3-4 weeks  SEIZURE PRECAUTIONS  Bathroom Safety  A person with seizures may want to shower instead of bathe to avoid accidental drowning. If falls occur during the patient's typical seizure, a person should use a shower seat, preferably one with a safety strap.    Use nonskid strips in your shower or tub.    Never use electrical equipment near water. This prevents accidental electrocution.    Consider changing glass in shower doors to shatterproof glass.   Secondary school teacher   If possible, cook when someone else is nearby.    Use the back burners of the stove to prevent accidental burns.    Use shatterproof containers as much as possible. For instance, sauces can be transferred from glass bottles to plastic containers for use.    Limit time that is required using knives or other sharp objects. If possible, buy foods that are already cut, or ask someone to help in meal preparation.   General Safety at Home   Do not smoke or light fires in the fireplace unless someone else is present.    Do not use space heaters that can be accidentally overturned.    When alone, avoid using step stools or ladders, and do not clean rooftop gutters.    Purchase power tools and motorized Risk manager which have a safety switch that will stop the machine if you release the handle (a 'dead man's' switch).   Driving and Transportation   Avoid driving unless your seizures are well controlled and/or you have permission to drive from your state's Department of Motor Vehicles  [facility]). Each state has different laws. Please refer to the following link on the Epilepsy [employer] of [employer]'s website for more information:  http://www.epilepsyfoundation.org/answerplace/Social/driving/drivingu.cfm    If you ride a bicycle, wear a helmet and any other necessary protective gear.    When taking public transportation like the bus or subway, stay clear of the platform edge.   Outdoor Product/process development scientist is okay, but does present certain risks. Never swim alone, and tell friends what to do if you have a seizure while swimming.    Wear appropriate protective equipment.    Ski with a friend. If a seizure occurs, your friend can seek help, if needed. He or she can also help to get you out of the cold. Consider using a safety hook or belt while riding the ski lift.

## 2013-04-21 NOTE — Progress Notes (Signed)
[facility] Neurology Division  Follow-up Visit   Date: 04/21/2013    Gerald Leach MRN: [id number] DOB: 1934/06/05   Interim History: Gerald Leach is a 79 y.o. year-old year-old Caucasian male with history of hypertension, hyperlipidemia, GERD, DM, CNS aneurysm s/p coiiling ([facility], 2002), and SDH s/p craniotomy (09/2012) returning to the clinic for followup of seizures. He was last seen in the clinic on 03/31/2013.  The patient was accompanied to the clinic by wife.   At his last, visit he had been referred to me for evaluation of his gait instability. Based on his exam I was concerned about a peripheral neuropathy and ordered routine labs as well as EMG. Labs indicated he 12 deficiency and EMG was consistent with a moderate generalized sensorimotor polyneuropathy.   Unfortunately [place] 10/29, [place] and had an episode of falling. Her ED note, the patient was not acting normal and he had a seizure so EMS was called.  When EMS arrived, he had another grand mal seizure. No incontinence or tongue biting. In the ED he had a CT brain which was unchanged from previously. His Keppra was increased to 1000 mg twice daily and he was discharged home. Since then, he has had no further seizures. There was no prodromal symptoms prior to seizure onset.   History of present illness: Patient reports noticing problems with his gait following his SDH. In April 2014, he sustained a fall and became acutely encephalopathic. He was taken to the ED where he was found to have left SDH. He was admitted from 4/3 - 09/30/2012 during which time he underwent evacuation x 2 and placed on seizure prophylaxis with keppra. He was eventually discharged to rehab facility. He currently follows with Dr. Riley Kill for TBI. Since he was doing well, in July 2014, trial of weaning Keppra was performed. However, on 8/19 he fell over the front porch chair while climbing the stairs and when his wife went to see  him, she noticed his arm was shaking. She reports noticing brief jerking of the left arm the previous evening, too, but it only lasted a few minutes.   Regarding, his gait, he reports noticing problems only after Keppra was starting but he also had a SDH at the same time. Looking back at Epic notes, it appears that even in 02/2012 he had a fall with severe facial hematoma and fractured T7 vertebra, so it is likely that symptoms have been longer than what he describes. He had myelogram which shoed high-grade stenosis at T7 and was evaluated by Dr. Yetta Barre, neurosurgeon. They advised that a watchful waiting or surgery and he elected the conservative option. He feels as if his balance is "off" and very unsteady. He has been walking with a cane since April. He has completed in-patient rehab already for gait problems. He reports urinary urge incontinence.    Medications:  Current Outpatient Prescriptions on File Prior to Visit  Medication Sig Dispense Refill  . levETIRAcetam (KEPPRA) 1000 MG tablet TAKE 1 TABLET TWICE A DAY--CALL DR [name] FOR REFILLS  60 tablet  1  . levothyroxine (SYNTHROID, LEVOTHROID) 50 MCG tablet Take 1 tablet (50 mcg total) by mouth daily before breakfast.  30 tablet  1  . metFORMIN (GLUCOPHAGE) 500 MG tablet Take 1 tablet (500 mg total) by mouth 2 (two) times daily with a meal.  60 tablet  1  . Multiple Vitamin (MULTIVITAMIN WITH MINERALS) TABS Take 1 tablet by mouth daily.      Marland Kitchen  omeprazole (PRILOSEC) 20 MG capsule Take 20 mg by mouth every Monday, Wednesday, and Friday.       . ONE TOUCH LANCETS MISC 1 each by Does not apply route daily.  200 each  0  . simvastatin (ZOCOR) 10 MG tablet Take 1 tablet (10 mg total) by mouth at bedtime.  30 tablet  1   No current facility-administered medications on file prior to visit.    Allergies: No Known Allergies   Review of Systems:  CONSTITUTIONAL: No fevers, chills, night sweats, or weight loss.   EYES: No visual changes or eye  pain ENT: No hearing changes.  No history of nose bleeds.   RESPIRATORY: No cough, wheezing and shortness of breath.   CARDIOVASCULAR: Negative for chest pain, and palpitations.   GI: Negative for abdominal discomfort, blood in stools or black stools.   GU:  No history of incontinence.   MUSCLOSKELETAL: + joint pain or swelling.  No myalgias.   SKIN: Negative for lesions, rash, and itching.   HEMATOLOGY/ONCOLOGY: Negative for prolonged bleeding, bruising easily, and swollen nodes.   ENDOCRINE: Negative for cold or heat intolerance, polydipsia or goiter.   PSYCH:  No depression or anxiety symptoms.   NEURO: As Above.   Vital Signs:  BP 130/86  Pulse 60  Temp(Src) 97.5 F (36.4 C) (Oral)  Ht 5\' 10"  (1.778 m)  Wt 190 lb 11.2 oz (86.501 kg)  BMI 27.36 kg/m2   Neurological Exam:  MENTAL STATUS including orientation to time, place, person, recent and remote memory, attention span and concentration, language, and fund of knowledge is fair. Speech is not dysarthric.   CRANIAL NERVES:  II: No visual field defects.  III-IV-VI: Pupils equal round and reactive to light. Restricted upward gaze bilaterally, otherwise normal conjugate, extra-ocular eye movements in all directions of gaze.  VII: Normal facial symmetry and movements.  VIII: Normal hearing and vestibular function.  IX-X: Normal palatal movement.  XI: Normal shoulder shrug and head rotation.  XII: Normal tongue strength and range of motion, no deviation or fasciculation.   MOTOR: Intrinsic hand atrophy bilaterally. No fasciculations or abnormal movements. No pronator drift.  Right Upper Extremity:    Left Upper Extremity:    Deltoid  5/5   Deltoid  5/5   Biceps  5/5   Biceps  5/5   Triceps  5/5   Triceps  5/5   Wrist extensors  5/5   Wrist extensors  5/5   Wrist flexors  5/5   Wrist flexors  5/5   Finger extensors  5/5   Finger extensors  5/5   Finger flexors  5/5   Finger flexors  5/5   Dorsal interossei  4/5   Dorsal  interossei  4/5   Abductor pollicis  5-/5   Abductor pollicis  5-/5   Tone (Ashworth scale)  0   Tone (Ashworth scale)  0    Right Lower Extremity:    Left Lower Extremity:    Hip flexors  5-/5   Hip flexors  5-/5   Hip extensors  5/5   Hip extensors  5/5   Knee flexors  5/5   Knee flexors  5/5   Knee extensors  5/5   Knee extensors  5/5   Dorsiflexors  5-/5   Dorsiflexors  5/5   Plantarflexors  5/5   Plantarflexors  5/5   Toe extensors  5-/5   Toe extensors  5/5   Toe flexors  5/5   Toe flexors  5/5   Tone (Ashworth scale)  0   Tone (Ashworth scale)  0    MSRs:  Right Left  brachioradialis  2+   brachioradialis  2+   biceps  2+   biceps  2+   triceps  2+   triceps  2+   patellar  3+   patellar  3+   ankle jerk  0   ankle jerk  0   Hoffman  no   Hoffman  no   plantar response  down   plantar response  down    SENSORY: Vibration reduced distal to knees and absent at ankles bilaterally. Romberg's sign present.   COORDINATION/GAIT:  Intact rapid alternating movements bilaterally. Gait is wide-based with small steps. There is moderate instability when he walks and he frequently would reach reach out to lean on exam table for support.  Data: Thoracic myelogram 04/29/2012: high-grade central stenosis at T7 associated with the vertebral plana compression fracture of retropulsed bone.  CT cervical spine 02/03/2013:  Findings: There is no acute fracture or listhesis within the cervical spine. Normal C1-2 articulations are intact.  Prevertebral soft tissues are normal.  Multilevel degenerative disc disease is seen throughout the cervical spine. Prominent bridging anterior osteophytes is present at the C2-3 level anteriorly. Multilevel facet arthropathy is present.  The partially visualized lung apices are clear.  IMPRESSION:  1. No CT evidence of acute traumatic injury.  2. Multilevel degenerative disc disease and facet arthrosis.   Component     Latest Ref Rng 03/24/2013 03/31/2013   Hemoglobin A1C     4.6 - 6.5 % 6.6 (H)   TSH     0.35 - 5.50 uIU/mL 3.65   Methylmalonic Acid, Quant     <0.40 umol/L  0.18  Vitamin B-12     211 - 911 pg/mL  176 (L)  Copper     70 - 175 mcg/dL  161  Ceruloplasmin     20 - 60 mg/dL  31   EMG 09/60/4540: 1. Generalized large fiber sensorimotor polyneuropathy affecting the left side; moderate in degree electrically. A mild superimposed L5-S1 radiculopathy cannot be excluded. 2. Left median neuropathy at or distal to the wrist consistent with the clinical diagnosis of carpal tunnel syndrome, overall these changes are mild-moderate in degree electrically. 3. Mild left ulnar neuropathy at the elbow with purely demyelinating features evidenced as conduction velocity slowing.  CT head 04/15/2013 Prior right frontal craniotomy for drainage of a right-sided subdural hematoma. Portion of brain extends towards the posterior right burr-hole.  No acute skull fracture or intracranial hemorrhage.  Complex calcified mass arising from the right clinoid measures up to 2.4 cm and is suggestive of a meningioma given the aeration of the right clinoid. Calcified aneurysm is a secondary less likely consideration. MR would prove helpful for further delineation if possible.  Prominent small vessel disease type changes. No CT evidence of large acute infarct.  Atrophy with ventricular prominence raising possibility of mild hydrocephalus.   IMPRESSION: 1.  Seizure disorder, history of subdural hematoma 09/2012  - Keppra 500 mg twice a day was initially started for seizure prophylaxis. He had a breakthrough seizure when Keppra was being weaned in the summer of 2014  - Recent breakthrough seizures in 04/15/2013 for which Keppra was increased to 1000 mg twice daily  - EEG to better localize the seizure focus  - CT head with complex calcified mass of the right clinoid area, ? Meningioma  - Will obtain MRI to better  characterize this lesion 2.  Gait disorder  secondary to large fiber polyneuropathy  - Neuropathy labs indicated B12 deficiency (176) 3.  Vitamin B12 deficiency 4.  History of SDH in 09/2012 s/p evacuation 5.  Abnormal CT brain with calcified mass of the right clinoid area, ?meningioma   PLAN/RECOMMENDATIONS:  1.  Take Keppra 1000mg  twice daily 2.  Come to office to get vitamin B12 injections daily x 5 days, then weekly x 1 month, then monthly 3.  EEG on Wednesday November 12th at 9:15am 4.  MRI brain and MRA head to better evaluation abnormal CT brain 5.  Seizure precautions discussed 6.  Return to clinic in 3-4 weeks   The duration of this appointment visit was 30 minutes of face-to-face time with the patient.  Greater than 50% of this time was spent in counseling, explanation of diagnosis, planning of further management, and coordination of care.   Thank you for allowing me to participate in patient's care.  If I can answer any additional questions, I would be pleased to do so.    Sincerely,    Elainah Rhyne K. Allena Katz, DO

## 2013-04-22 ENCOUNTER — Ambulatory Visit: Payer: Medicare Other

## 2013-04-22 ENCOUNTER — Ambulatory Visit (INDEPENDENT_AMBULATORY_CARE_PROVIDER_SITE_OTHER): Payer: Medicare Other

## 2013-04-22 DIAGNOSIS — E538 Deficiency of other specified B group vitamins: Secondary | ICD-10-CM

## 2013-04-22 MED ORDER — CYANOCOBALAMIN 1000 MCG/ML IJ SOLN
1000.0000 ug | Freq: Once | INTRAMUSCULAR | Status: AC
  Administered 2013-04-22: 1000 ug via INTRAMUSCULAR

## 2013-04-22 MED ORDER — CYANOCOBALAMIN 1000 MCG/ML IJ SOLN
1000.0000 ug | Freq: Once | INTRAMUSCULAR | Status: AC
  Administered 2013-04-21: 1000 ug via INTRAMUSCULAR

## 2013-04-22 NOTE — Addendum Note (Signed)
Addended by: Fayne Mediate on: 04/22/2013 10:59 AM   Modules accepted: Orders

## 2013-04-23 ENCOUNTER — Ambulatory Visit (INDEPENDENT_AMBULATORY_CARE_PROVIDER_SITE_OTHER): Payer: Medicare Other

## 2013-04-23 DIAGNOSIS — E538 Deficiency of other specified B group vitamins: Secondary | ICD-10-CM

## 2013-04-23 DIAGNOSIS — D518 Other vitamin B12 deficiency anemias: Secondary | ICD-10-CM

## 2013-04-23 MED ORDER — CYANOCOBALAMIN 1000 MCG/ML IJ SOLN
1000.0000 ug | Freq: Once | INTRAMUSCULAR | Status: AC
  Administered 2013-04-23: 1000 ug via INTRAMUSCULAR

## 2013-04-24 ENCOUNTER — Ambulatory Visit (INDEPENDENT_AMBULATORY_CARE_PROVIDER_SITE_OTHER): Payer: Medicare Other

## 2013-04-24 DIAGNOSIS — D518 Other vitamin B12 deficiency anemias: Secondary | ICD-10-CM

## 2013-04-24 DIAGNOSIS — E538 Deficiency of other specified B group vitamins: Secondary | ICD-10-CM

## 2013-04-24 MED ORDER — CYANOCOBALAMIN 1000 MCG/ML IJ SOLN
1000.0000 ug | Freq: Once | INTRAMUSCULAR | Status: AC
  Administered 2013-04-24: 1000 ug via INTRAMUSCULAR

## 2013-04-27 ENCOUNTER — Ambulatory Visit (INDEPENDENT_AMBULATORY_CARE_PROVIDER_SITE_OTHER): Payer: Medicare Other

## 2013-04-27 DIAGNOSIS — E538 Deficiency of other specified B group vitamins: Secondary | ICD-10-CM

## 2013-04-27 DIAGNOSIS — D518 Other vitamin B12 deficiency anemias: Secondary | ICD-10-CM

## 2013-04-27 MED ORDER — CYANOCOBALAMIN 1000 MCG/ML IJ SOLN
1000.0000 ug | Freq: Once | INTRAMUSCULAR | Status: AC
  Administered 2013-04-27: 1000 ug via INTRAMUSCULAR

## 2013-04-29 ENCOUNTER — Ambulatory Visit (HOSPITAL_COMMUNITY): Payer: Medicare Other

## 2013-05-01 ENCOUNTER — Ambulatory Visit
Admission: RE | Admit: 2013-05-01 | Discharge: 2013-05-01 | Disposition: A | Discharge status: Home / Self Care | Payer: Medicare Other | Source: Ambulatory Visit | Attending: Neurology | Admitting: Neurology

## 2013-05-01 MED ORDER — GADOBENATE DIMEGLUMINE 529 MG/ML IV SOLN
18.0000 mL | Freq: Once | INTRAVENOUS | Status: AC | PRN
  Administered 2013-05-01: 18 mL via INTRAVENOUS

## 2013-05-04 ENCOUNTER — Ambulatory Visit: Payer: Medicare Other

## 2013-05-05 ENCOUNTER — Ambulatory Visit: Payer: Medicare Other

## 2013-05-05 ENCOUNTER — Encounter: Payer: Self-pay | Admitting: Neurology

## 2013-05-05 ENCOUNTER — Ambulatory Visit (INDEPENDENT_AMBULATORY_CARE_PROVIDER_SITE_OTHER): Payer: Medicare Other | Admitting: Neurology

## 2013-05-05 VITALS — BP 118/74 | HR 78 | Temp 97.8°F | Ht 73.5 in | Wt 190.5 lb

## 2013-05-05 DIAGNOSIS — E538 Deficiency of other specified B group vitamins: Secondary | ICD-10-CM

## 2013-05-05 DIAGNOSIS — R569 Unspecified convulsions: Secondary | ICD-10-CM

## 2013-05-05 MED ORDER — CYANOCOBALAMIN 1000 MCG/ML IJ SOLN
1000.0000 ug | Freq: Once | INTRAMUSCULAR | Status: AC
  Administered 2013-05-05: 1000 ug via INTRAMUSCULAR

## 2013-05-05 NOTE — Patient Instructions (Addendum)
1.  Continue Keppra 1000mg  twice daily 2.  Vitamin B12 injections 1000 mcg weekly x 1 month, then monthly 3.  Routine EEG -Monday December 1@ 9:30 to arrive at 9:15  5.  Seizure precautions discussed 6.  Fall precautions dicussed 7.  Return to clinic in 64-months

## 2013-05-05 NOTE — Progress Notes (Signed)
[facility] Neurology Division  Follow-up Visit   Date: 05/05/2013    Gerald Leach MRN: [id number] DOB: [birth year]   Interim History: Gerald Leach is a 79 y.o. Caucasian male with history of hypertension, hyperlipidemia, GERD, DM, CNS aneurysm s/p coiiling ([facility], 2002), and SDH s/p craniotomy (09/2012) returning to the clinic for followup of seizures. He was last seen in the clinic on 04/21/2013.  The patient was accompanied to the clinic by his daughter, Geoffery Spruce.   He reports doing fairly well since his last visit.  No interval seizures.  Unfortunately, his wife had a fall this past weekend and broke her hip.  He did not get EEG done because he was unaware of the appointment.  History of present illness: Patient reports noticing problems with his gait following his SDH. In April 2014, he sustained a fall and became acutely encephalopathic. He was taken to the ED where he was found to have left SDH. He was admitted from 4/3 - 09/30/2012 during which time he underwent evacuation x 2 and placed on seizure prophylaxis with keppra. He was eventually discharged to rehab facility. He currently follows with Dr. Riley Kill for TBI. Since he was doing well, in July 2014, trial of weaning Keppra was performed. However, on 8/19 he fell over the front porch chair while climbing the stairs and when his wife went to see him, she noticed his arm was shaking. She reports noticing brief jerking of the left arm the previous evening, too, but it only lasted a few minutes.   Regarding his gait, he reports noticing problems only after Keppra was starting but he also had a SDH at the same time. Looking back at Epic notes, it appears that even in 02/2012 he had a fall with severe facial hematoma and fractured T7 vertebra, so it is likely that symptoms have been longer than what he describes. He had myelogram which shoed high-grade stenosis at T7 and was evaluated by Dr. Yetta Barre, neurosurgeon. They advised that a watchful  waiting or surgery and he elected the conservative option. He feels as if his balance is "off" and very unsteady. He has been walking with a cane since April. He has completed in-patient rehab already for gait problems. He reports urinary urge incontinence.   11/4 Follow-up:  Labs indicated he 12 deficiency and EMG was consistent with a moderate generalized sensorimotor polyneuropathy.  B12 supplementation was started.  Of note, on 10/29 he had several grand mal seizures and was taken to the hospital.  Keppra increased to 1000mg  BID.   Medications:  Current Outpatient Prescriptions on File Prior to Visit  Medication Sig Dispense Refill  . levETIRAcetam (KEPPRA) 1000 MG tablet TAKE 1 TABLET TWICE A DAY--CALL DR [name] FOR REFILLS  60 tablet  1  . levothyroxine (SYNTHROID, LEVOTHROID) 50 MCG tablet Take 1 tablet (50 mcg total) by mouth daily before breakfast.  30 tablet  1  . metFORMIN (GLUCOPHAGE) 500 MG tablet Take 1 tablet (500 mg total) by mouth 2 (two) times daily with a meal.  60 tablet  1  . Multiple Vitamin (MULTIVITAMIN WITH MINERALS) TABS Take 1 tablet by mouth daily.      Marland Kitchen omeprazole (PRILOSEC) 20 MG capsule Take 20 mg by mouth every Monday, Wednesday, and Friday.       . ONE TOUCH LANCETS MISC 1 each by Does not apply route daily.  200 each  0  . simvastatin (ZOCOR) 10 MG tablet Take 1 tablet (10 mg total) by mouth  at bedtime.  30 tablet  1   No current facility-administered medications on file prior to visit.    Allergies: No Known Allergies   Review of Systems:  CONSTITUTIONAL: No fevers, chills, night sweats, or weight loss.   EYES: No visual changes or eye pain ENT: No hearing changes.  No history of nose bleeds.   RESPIRATORY: No cough, wheezing and shortness of breath.   CARDIOVASCULAR: Negative for chest pain, and palpitations.   GI: Negative for abdominal discomfort, blood in stools or black stools.   GU:  No history of incontinence.   MUSCLOSKELETAL: + joint pain or  swelling.  No myalgias.   SKIN: Negative for lesions, rash, and itching.   HEMATOLOGY/ONCOLOGY: Negative for prolonged bleeding, bruising easily, and swollen nodes.   ENDOCRINE: Negative for cold or heat intolerance, polydipsia or goiter.   PSYCH:  No depression or anxiety symptoms.   NEURO: As Above.   Vital Signs:  BP 118/74  Pulse 78  Temp(Src) 97.8 F (36.6 C) (Oral)  Ht 6' 1.5" (1.867 m)  Wt 190 lb 8 oz (86.41 kg)  BMI 24.79 kg/m2  Neurological Exam:  MENTAL STATUS including orientation to time, place, person, recent and remote memory, attention span and concentration, language, and fund of knowledge is fair. Speech is not dysarthric.   CRANIAL NERVES:  III-IV-VI: Pupils equal round and reactive to light. Restricted upward gaze bilaterally, otherwise normal conjugate, extra-ocular eye movements in all directions of gaze.  VII: Normal facial symmetry and movements.  IX-X: Normal palatal movement.  XII: Normal tongue strength and range of motion, no deviation or fasciculation.   MOTOR:  5/5 throughout except mild weakness in ABP, FDI.  Intrinsic hand atrophy bilaterally. No pronator drift.  Tone is normal.  MSRs:  Right      Left  brachioradialis  2+   brachioradialis  2+   biceps  2+   biceps  2+   triceps  2+   triceps  2+   patellar  3+   patellar  3+   ankle jerk  0   ankle jerk  0    SENSORY: Vibration reduced distal to knees and absent at ankles bilaterally (unchanged)   COORDINATION/GAIT:  Gait is wide-based with small steps, moderate instability when he walks.  Data: Thoracic myelogram 04/29/2012: high-grade central stenosis at T7 associated with the vertebral plana compression fracture of retropulsed bone.  CT cervical spine 02/03/2013:  Findings: There is no acute fracture or listhesis within the cervical spine. Normal C1-2 articulations are intact.  Prevertebral soft tissues are normal.  Multilevel degenerative disc disease is seen throughout the cervical  spine. Prominent bridging anterior osteophytes is present at the C2-3 level anteriorly. Multilevel facet arthropathy is present.  The partially visualized lung apices are clear.  IMPRESSION:  1. No CT evidence of acute traumatic injury.  2. Multilevel degenerative disc disease and facet arthrosis.   Component     Latest Ref Rng 03/24/2013 03/31/2013  Hemoglobin A1C     4.6 - 6.5 % 6.6 (H)   TSH     0.35 - 5.50 uIU/mL 3.65   Methylmalonic Acid, Quant     <0.40 umol/L  0.18  Vitamin B-12     211 - 911 pg/mL  176 (L)  Copper     70 - 175 mcg/dL  161  Ceruloplasmin     20 - 60 mg/dL  31   EMG 09/60/4540: 1. Generalized large fiber sensorimotor polyneuropathy affecting the left side; moderate  in degree electrically. A mild superimposed L5-S1 radiculopathy cannot be excluded. 2. Left median neuropathy at or distal to the wrist consistent with the clinical diagnosis of carpal tunnel syndrome, overall these changes are mild-moderate in degree electrically. 3. Mild left ulnar neuropathy at the elbow with purely demyelinating features evidenced as conduction velocity slowing.  MRI/A brain 04/21/2013: No acute finding. No evidence of residual or recurrent subduralcollection. Generalized brain atrophy and extensive chronic small vessel changes throughout the brain. Chronic right internal carotid artery occlusion. 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 2.5 x 1.7 x 2.1 cm extra axial lesion projecting upward from the clinoid process on the right, indenting the inferior frontal lobe. Mild adjacent gliosis. This lesion is felt to represent a chronic meningioma.  Right internal carotid artery occlusion. Hyperdense entity in the pre clinoid ICA on the CT examinations looks like a foreign object. Flow through a patent anterior communicating artery allows supply to the right hemisphere. I think there is probably a fenestrated anterior communicating artery. Difficult to completely exclude a  small anterior communicating artery aneurysm, but I do not favor that. If present, a would be no larger than 4 mm.   IMPRESSION: 1.  Seizure disorder, history of subdural hematoma 09/2012  - Keppra 500 mg twice a day was initially started for seizure prophylaxis. He had a breakthrough seizure when Keppra was being weaned in the summer of 2014  - Recent breakthrough seizures in 04/15/2013 for which Keppra was increased to 1000 mg twice daily  - Will get EEG to better localize the seizure focus since there are several foci he could be seizing from (old right temporal lobe infarct, R clinoid meningioma, history of SDH) 2.  Gait disorder secondary to large fiber polyneuropathy  - Neuropathy labs indicated B12 deficiency (176) 3.  Vitamin B12 deficiency 4.  Right clinoid calcified meningioma, chronic  - Images reviewed personally with patient.    - Discussed option of neurosurgical referral, but patient declined and prefers conservative management  5.  High grade T7 central canal stenosis with T7 vertebral fracture s/p fall (02/2012)  - Previously seen Dr. Yetta Barre in Neurosurgery, patient elected conservative management 6.  History of SDH in 09/2012 s/p evacuation   PLAN/RECOMMENDATIONS:  1.  Continue Keppra 1000mg  twice daily 2.  Vitamin B12 injections 1000 mcg weekly x 1 month, then monthly 3.  Routine EEG  5.  Seizure precautions discussed 6.  Fall precautions dicussed 7.  Return to clinic in 66-months   The duration of this appointment visit was 30 minutes of face-to-face time with the patient.  Greater than 50% of this time was spent in counseling, explanation of diagnosis, planning of further management, and coordination of care.   Thank you for allowing me to participate in patient's care.  If I can answer any additional questions, I would be pleased to do so.    Sincerely,    Javarion Douty K. Allena Katz, DO

## 2013-05-11 ENCOUNTER — Ambulatory Visit (INDEPENDENT_AMBULATORY_CARE_PROVIDER_SITE_OTHER): Payer: Medicare Other | Admitting: Neurology

## 2013-05-11 DIAGNOSIS — E538 Deficiency of other specified B group vitamins: Secondary | ICD-10-CM

## 2013-05-11 MED ORDER — CYANOCOBALAMIN 1000 MCG/ML IJ SOLN
1000.0000 ug | Freq: Once | INTRAMUSCULAR | Status: AC
  Administered 2013-05-11: 1000 ug via INTRAMUSCULAR

## 2013-05-11 NOTE — Progress Notes (Signed)
Patient in for b-12 injection given by Marlane Hatcher in left deltoid.

## 2013-05-12 ENCOUNTER — Encounter: Payer: Self-pay | Admitting: Neurology

## 2013-05-13 ENCOUNTER — Inpatient Hospital Stay (HOSPITAL_COMMUNITY)
Admission: EM | Admit: 2013-05-13 | Discharge: 2013-05-18 | DRG: 101 | Disposition: A | Discharge status: Skilled Nursing Facility | Payer: Medicare Other | Attending: Family Medicine | Admitting: Family Medicine

## 2013-05-13 ENCOUNTER — Encounter (HOSPITAL_COMMUNITY): Payer: Self-pay | Admitting: Emergency Medicine

## 2013-05-13 ENCOUNTER — Emergency Department (HOSPITAL_COMMUNITY): Payer: Medicare Other

## 2013-05-13 DIAGNOSIS — T4275XA Adverse effect of unspecified antiepileptic and sedative-hypnotic drugs, initial encounter: Secondary | ICD-10-CM | POA: Diagnosis not present

## 2013-05-13 DIAGNOSIS — R1310 Dysphagia, unspecified: Secondary | ICD-10-CM

## 2013-05-13 DIAGNOSIS — Z860100 Personal history of colon polyps, unspecified: Secondary | ICD-10-CM

## 2013-05-13 DIAGNOSIS — Z781 Physical restraint status: Secondary | ICD-10-CM | POA: Diagnosis not present

## 2013-05-13 DIAGNOSIS — I10 Essential (primary) hypertension: Secondary | ICD-10-CM | POA: Diagnosis present

## 2013-05-13 DIAGNOSIS — S065X9A Traumatic subdural hemorrhage with loss of consciousness of unspecified duration, initial encounter: Secondary | ICD-10-CM

## 2013-05-13 DIAGNOSIS — E039 Hypothyroidism, unspecified: Secondary | ICD-10-CM | POA: Diagnosis present

## 2013-05-13 DIAGNOSIS — K219 Gastro-esophageal reflux disease without esophagitis: Secondary | ICD-10-CM | POA: Diagnosis present

## 2013-05-13 DIAGNOSIS — Z79899 Other long term (current) drug therapy: Secondary | ICD-10-CM

## 2013-05-13 DIAGNOSIS — S51812A Laceration without foreign body of left forearm, initial encounter: Secondary | ICD-10-CM

## 2013-05-13 DIAGNOSIS — T424X5A Adverse effect of benzodiazepines, initial encounter: Secondary | ICD-10-CM | POA: Diagnosis not present

## 2013-05-13 DIAGNOSIS — Z9889 Other specified postprocedural states: Secondary | ICD-10-CM

## 2013-05-13 DIAGNOSIS — G40309 Generalized idiopathic epilepsy and epileptic syndromes, not intractable, without status epilepticus: Principal | ICD-10-CM | POA: Diagnosis present

## 2013-05-13 DIAGNOSIS — G40909 Epilepsy, unspecified, not intractable, without status epilepticus: Secondary | ICD-10-CM

## 2013-05-13 DIAGNOSIS — D126 Benign neoplasm of colon, unspecified: Secondary | ICD-10-CM

## 2013-05-13 DIAGNOSIS — E1165 Type 2 diabetes mellitus with hyperglycemia: Secondary | ICD-10-CM

## 2013-05-13 DIAGNOSIS — L570 Actinic keratosis: Secondary | ICD-10-CM

## 2013-05-13 DIAGNOSIS — E871 Hypo-osmolality and hyponatremia: Secondary | ICD-10-CM | POA: Diagnosis present

## 2013-05-13 DIAGNOSIS — E785 Hyperlipidemia, unspecified: Secondary | ICD-10-CM | POA: Diagnosis present

## 2013-05-13 DIAGNOSIS — E783 Hyperchylomicronemia: Secondary | ICD-10-CM | POA: Diagnosis present

## 2013-05-13 DIAGNOSIS — R4182 Altered mental status, unspecified: Secondary | ICD-10-CM

## 2013-05-13 DIAGNOSIS — IMO0001 Reserved for inherently not codable concepts without codable children: Secondary | ICD-10-CM | POA: Diagnosis present

## 2013-05-13 DIAGNOSIS — R410 Disorientation, unspecified: Secondary | ICD-10-CM | POA: Diagnosis present

## 2013-05-13 DIAGNOSIS — F19921 Other psychoactive substance use, unspecified with intoxication with delirium: Secondary | ICD-10-CM | POA: Diagnosis not present

## 2013-05-13 DIAGNOSIS — R569 Unspecified convulsions: Secondary | ICD-10-CM

## 2013-05-13 DIAGNOSIS — T50905A Adverse effect of unspecified drugs, medicaments and biological substances, initial encounter: Secondary | ICD-10-CM | POA: Diagnosis present

## 2013-05-13 DIAGNOSIS — C44300 Unspecified malignant neoplasm of skin of unspecified part of face: Secondary | ICD-10-CM

## 2013-05-13 DIAGNOSIS — G40409 Other generalized epilepsy and epileptic syndromes, not intractable, without status epilepticus: Secondary | ICD-10-CM

## 2013-05-13 LAB — CBC WITH DIFFERENTIAL/PLATELET
Basophils Absolute: 0 10*3/uL (ref 0.0–0.1)
Basophils Relative: 0 % (ref 0–1)
Eosinophils Relative: 1 % (ref 0–5)
Lymphocytes Relative: 15 % (ref 12–46)
MCHC: 34.6 g/dL (ref 30.0–36.0)
Monocytes Absolute: 1.2 10*3/uL — ABNORMAL HIGH (ref 0.1–1.0)
Neutro Abs: 7.9 10*3/uL — ABNORMAL HIGH (ref 1.7–7.7)
Neutrophils Relative %: 73 % (ref 43–77)
Platelets: 197 10*3/uL (ref 150–400)
RDW: 14.2 % (ref 11.5–15.5)
WBC: 10.8 10*3/uL — ABNORMAL HIGH (ref 4.0–10.5)

## 2013-05-13 LAB — COMPREHENSIVE METABOLIC PANEL
ALT: 6 U/L (ref 0–53)
AST: 14 U/L (ref 0–37)
Albumin: 3.4 g/dL — ABNORMAL LOW (ref 3.5–5.2)
CO2: 21 mEq/L (ref 19–32)
Calcium: 8.9 mg/dL (ref 8.4–10.5)
Chloride: 95 mEq/L — ABNORMAL LOW (ref 96–112)
Creatinine, Ser: 0.94 mg/dL (ref 0.50–1.35)
GFR calc non Af Amer: 79 mL/min — ABNORMAL LOW (ref >90)
Sodium: 128 mEq/L — ABNORMAL LOW (ref 135–145)
Total Bilirubin: 0.3 mg/dL (ref 0.3–1.2)
Total Protein: 6.8 g/dL (ref 6.0–8.3)

## 2013-05-13 LAB — URINALYSIS, ROUTINE W REFLEX MICROSCOPIC
Bilirubin Urine: NEGATIVE
Glucose, UA: NEGATIVE mg/dL
Hgb urine dipstick: NEGATIVE
Leukocytes, UA: NEGATIVE
Nitrite: NEGATIVE
Protein, ur: NEGATIVE mg/dL
Specific Gravity, Urine: 1.015 (ref 1.005–1.030)
pH: 7.5 (ref 5.0–8.0)

## 2013-05-13 MED ORDER — SODIUM CHLORIDE 0.9 % IV BOLUS (SEPSIS)
1000.0000 mL | Freq: Once | INTRAVENOUS | Status: AC
  Administered 2013-05-14: 1000 mL via INTRAVENOUS

## 2013-05-13 MED ORDER — LORAZEPAM 2 MG/ML IJ SOLN
2.0000 mg | Freq: Once | INTRAMUSCULAR | Status: AC
  Administered 2013-05-13: 2 mg via INTRAVENOUS

## 2013-05-13 MED ORDER — LORAZEPAM 2 MG/ML IJ SOLN
INTRAMUSCULAR | Status: AC
  Filled 2013-05-13: qty 1

## 2013-05-13 NOTE — ED Notes (Signed)
Per EMS, pt was visiting wife at nursing home when he had a seizure, falling out of his wheelchair on to the floor. Pt hit arm on floor, resulting in 1-2 inch laceration which has been bleeding since EMS arrived. Pt had 4 subsequent seizures each lasting 1 minute. Pt has a hx of seizures since his stroke 1 year ago. Pt has left sided weakness, facial droop since stroke.Pt alert and oriented to person, place, event.

## 2013-05-13 NOTE — ED Notes (Signed)
Bed: ZO10 Expected date:  Expected time:  Means of arrival:  Comments: EMS/elderly/seizure

## 2013-05-13 NOTE — ED Provider Notes (Signed)
CSN: [id number]     Arrival date & time 05/13/13  2024 History   First MD Initiated Contact with Patient 05/13/13 2027     Chief Complaint  Patient presents with  . Seizures  . Extremity Laceration   (Consider location/radiation/quality/duration/timing/severity/associated sxs/prior Treatment) Patient is a 79 y.o. male presenting with seizures. The history is provided by the EMS personnel and a relative.  Seizures  Gerald Leach is a 79 y.o. male  with a hx of CVA (1 yr ago with subsequent seizures), HTN presents to the Emergency Department complaining via EMS after several seizures this evening.  Pt's first seizure caused him to fall out of his wheelchair hitting his head and causing a L forearm laceration.  EMS reports 3 seizures prior to arrival.  Level 5 caveat for AMS and active seizure.    Past Medical History  Diagnosis Date  . Hypertension   . Colonic polyp 2003  . GERD (gastroesophageal reflux disease)   . ED (erectile dysfunction)   . Hyperlipidemia   . Hypothyroidism   . Brain aneurysm   . Seizures     per family   Past Surgical History  Procedure Laterality Date  . Brain aneurysm surgery      at [facility]  . Carotid artery aneurysm    . Colonoscopy      polyps  . Right hernia    . Craniotomy Right 09/19/2012    Procedure: CRANIOTOMY HEMATOMA EVACUATION SUBDURAL;  Surgeon: Clydene Fake, MD;  Location: [facility];  Service: Neurosurgery;  Laterality: Right;  . Craniotomy Right 09/22/2012    Procedure: CRANIOTOMY HEMATOMA EVACUATION SUBDURAL;  Surgeon: Clydene Fake, MD;  Location: [facility];  Service: Neurosurgery;  Laterality: Right;   Family History  Problem Relation Age of Onset  . Liver disease Brother     Died   History  Substance Use Topics  . Smoking status: Never Smoker   . Smokeless tobacco: Never Used  . Alcohol Use: Yes     Comment: rum mixed in diet coke 3 a week    Review of Systems  Unable to perform ROS: Acuity of condition   Neurological: Positive for seizures.    Allergies  Review of patient's allergies indicates no known allergies.  Home Medications   Current Outpatient Rx  Name  Route  Sig  Dispense  Refill  . levETIRAcetam (KEPPRA) 1000 MG tablet   Oral   Take 1,000 mg by mouth 2 (two) times daily.         Marland Kitchen levothyroxine (SYNTHROID, LEVOTHROID) 50 MCG tablet   Oral   Take 1 tablet (50 mcg total) by mouth daily before breakfast.   30 tablet   1   . metFORMIN (GLUCOPHAGE) 500 MG tablet   Oral   Take 1 tablet (500 mg total) by mouth 2 (two) times daily with a meal.   60 tablet   1   . Multiple Vitamin (MULTIVITAMIN WITH MINERALS) TABS   Oral   Take 1 tablet by mouth daily.         Marland Kitchen omeprazole (PRILOSEC) 20 MG capsule   Oral   Take 20 mg by mouth every Monday, Wednesday, and Friday.          . simvastatin (ZOCOR) 10 MG tablet   Oral   Take 1 tablet (10 mg total) by mouth at bedtime.   30 tablet   1   . ONE TOUCH LANCETS MISC   Does not apply  1 each by Does not apply route daily.   200 each   0     DM II - 250.00    BP 140/88  Pulse 77  Temp(Src) 98.5 F (36.9 C) (Oral)  SpO2 95% Physical Exam  Nursing note and vitals reviewed. Constitutional: He appears well-developed and well-nourished. He appears distressed.  HENT:  Head: Atraumatic.  Mouth/Throat: No oropharyngeal exudate.  Eyes: No scleral icterus.  Cardiovascular: Intact distal pulses.   Genitourinary:  No incontinence  Musculoskeletal: He exhibits no edema.  Neurological:  Pt with active grand mal seizure  Skin: Skin is warm and dry. He is not diaphoretic. No erythema.  5cm laceration to the left forearm with active bleeding  Psychiatric: He has a normal mood and affect.    ED Course  LACERATION REPAIR Date/Time: 05/14/2013 12:04 AM Performed by: Dierdre Forth Authorized by: Dierdre Forth Consent: Verbal consent obtained. Risks and benefits: risks, benefits and alternatives were  discussed Consent given by: patient and guardian Patient understanding: patient states understanding of the procedure being performed Patient consent: the patient's understanding of the procedure matches consent given Procedure consent: procedure consent matches procedure scheduled Test results: test results available and properly labeled Site marked: the operative site was marked Required items: required blood products, implants, devices, and special equipment available Patient identity confirmed: verbally with patient and arm band Time out: Immediately prior to procedure a "time out" was called to verify the correct patient, procedure, equipment, support staff and site/side marked as required. Body area: upper extremity Location details: left lower arm Laceration length: 5 cm Foreign bodies: no foreign bodies Tendon involvement: none Nerve involvement: none Vascular damage: yes Anesthesia: local infiltration Local anesthetic: lidocaine 2% with epinephrine Anesthetic total: 12 ml Patient sedated: no Preparation: Patient was prepped and draped in the usual sterile fashion. Irrigation solution: saline Irrigation method: syringe Amount of cleaning: standard Debridement: none Degree of undermining: none Skin closure: 4-0 Prolene Number of sutures: 6 Technique: horizontal mattress and simple (3 horizontal mattress anda 3 simple interrupted) Approximation: loose Approximation difficulty: complex Dressing: 4x4 sterile gauze and pressure dressing Comments: Pt combative throughout procedure, but hemostasis achieved without immediate complications   (including critical care time) Labs Review Labs Reviewed  CBC WITH DIFFERENTIAL - Abnormal; Notable for the following:    WBC 10.8 (*)    HCT 38.7 (*)    Neutro Abs 7.9 (*)    Monocytes Absolute 1.2 (*)    All other components within normal limits  COMPREHENSIVE METABOLIC PANEL - Abnormal; Notable for the following:    Sodium 128 (*)     Chloride 95 (*)    Glucose, Bld 142 (*)    Albumin 3.4 (*)    GFR calc non Af Amer 79 (*)    All other components within normal limits  CG4 I-STAT (LACTIC ACID) - Abnormal; Notable for the following:    Lactic Acid, Venous 2.80 (*)    All other components within normal limits  URINALYSIS, ROUTINE W REFLEX MICROSCOPIC   Imaging Review Ct Head Wo Contrast  05/13/2013   CLINICAL DATA:  Fall, seizure.  EXAM: CT HEAD WITHOUT CONTRAST  CT CERVICAL SPINE WITHOUT CONTRAST  TECHNIQUE: Multidetector CT imaging of the head and cervical spine was performed following the standard protocol without intravenous contrast. Multiplanar CT image reconstructions of the cervical spine were also generated.  COMPARISON:  CT scan of April 15, 2013. MRI scan of May 01, 2013.  FINDINGS: CT HEAD FINDINGS  Status post right  parietal craniotomy. Mild diffuse cortical atrophy is noted. Chronic ischemic white matter disease is noted. Ventricular size is unchanged compared to prior exam and consistent with degree of atrophy present. 2.3 x 1.4 cm calcified mass is again noted superior to the right clinoid process which was demonstrated to represent meningioma on prior MRI exam. There is no evidence of acute infarction or hemorrhage seen currently.  CT CERVICAL SPINE FINDINGS  No fracture or spondylolisthesis is noted. Mild degenerative disc disease is noted at C3-4 and C4-5. Minimal hypertrophy of posterior facet joints is seen at multiple levels.  IMPRESSION: Status post right parietal craniotomy. Mild diffuse cortical atrophy and chronic ischemic white matter disease. No change seen involving 2.3 cm calcified mass arising from right clinoid process consistent with meningioma as described on prior MRI exam. No acute intracranial abnormality is noted.  Degenerative changes are noted in the cervical spine. No acute abnormality seen.   Electronically Signed   By: Roque Lias M.D.   On: 05/13/2013 22:03   Ct Cervical Spine Wo  Contrast  05/13/2013   CLINICAL DATA:  Fall, seizure.  EXAM: CT HEAD WITHOUT CONTRAST  CT CERVICAL SPINE WITHOUT CONTRAST  TECHNIQUE: Multidetector CT imaging of the head and cervical spine was performed following the standard protocol without intravenous contrast. Multiplanar CT image reconstructions of the cervical spine were also generated.  COMPARISON:  CT scan of April 15, 2013. MRI scan of May 01, 2013.  FINDINGS: CT HEAD FINDINGS  Status post right parietal craniotomy. Mild diffuse cortical atrophy is noted. Chronic ischemic white matter disease is noted. Ventricular size is unchanged compared to prior exam and consistent with degree of atrophy present. 2.3 x 1.4 cm calcified mass is again noted superior to the right clinoid process which was demonstrated to represent meningioma on prior MRI exam. There is no evidence of acute infarction or hemorrhage seen currently.  CT CERVICAL SPINE FINDINGS  No fracture or spondylolisthesis is noted. Mild degenerative disc disease is noted at C3-4 and C4-5. Minimal hypertrophy of posterior facet joints is seen at multiple levels.  IMPRESSION: Status post right parietal craniotomy. Mild diffuse cortical atrophy and chronic ischemic white matter disease. No change seen involving 2.3 cm calcified mass arising from right clinoid process consistent with meningioma as described on prior MRI exam. No acute intracranial abnormality is noted.  Degenerative changes are noted in the cervical spine. No acute abnormality seen.   Electronically Signed   By: Roque Lias M.D.   On: 05/13/2013 22:03    EKG Interpretation    Date/Time:  Wednesday May 13 2013 20:43:20 EST Ventricular Rate:  87 PR Interval:  196 QRS Duration: 97 QT Interval:  376 QTC Calculation: 452 R Axis:   -14 Text Interpretation:  sinus rhythm Borderline low voltage, extremity leads nonspecific t wave changes No significant change since last tracing Confirmed by Karma Ganja  MD, MARTHA [id number]) on  05/13/2013 9:49:47 PM            MDM   1. Seizure   2. Laceration of forearm, left, initial encounter   3. Altered mental status      Gerald Leach presents with his 4th grand mal seizure this evening.  Pt given 2mg  Ativan with resolution of seizure.  Pressure dressing applied to L forearm laceration.  Will assess for causes of increased seizure activity.     9:58 PM  Face to face Exam:   General: Awake, agitated HEENT: Atraumatic, PERRL, no oral trauma Resp: Normal effort,  clear and equal breath sounds  Abd: Nondistended, soft and nontender Neuro:L sided weakness (at baseline per family), confused  Lymph: No adenopathy Skin: 5cm laceration to the L forearm  11:58PM Patient continues to be agitated and confused. At this point he is urinating on himself. Family states this is not his baseline. We'll proceed with admission for overnight observation.  Record review shows that patient has a prior history of aneurysm coiling at [facility] in 2002. In April of this year, the patient had a fall with head injury and developed a subdural hematoma which had to be evacuated on 2 separate occasions. After placement and rehabilitation, the patient had been put on Keppra twice daily for preventative purposes. After his dosage was weaned, the patient had a seizure apparently in August of this year and his Keppra was reinstated at his original dose. No further episodes until 04/15/13 when he was evaluated for another grand mal seizure and discharged from the ED.  He returned to his baseline almost immediately that time.     Will proceed with admission by [facility].          Dahlia Client Edvardo Honse, PA-C 05/14/13 [phone]

## 2013-05-14 DIAGNOSIS — R569 Unspecified convulsions: Secondary | ICD-10-CM

## 2013-05-14 DIAGNOSIS — IMO0001 Reserved for inherently not codable concepts without codable children: Secondary | ICD-10-CM

## 2013-05-14 DIAGNOSIS — S51809A Unspecified open wound of unspecified forearm, initial encounter: Secondary | ICD-10-CM

## 2013-05-14 DIAGNOSIS — G40909 Epilepsy, unspecified, not intractable, without status epilepticus: Secondary | ICD-10-CM

## 2013-05-14 DIAGNOSIS — R4182 Altered mental status, unspecified: Secondary | ICD-10-CM

## 2013-05-14 DIAGNOSIS — E039 Hypothyroidism, unspecified: Secondary | ICD-10-CM

## 2013-05-14 LAB — BASIC METABOLIC PANEL
BUN: 10 mg/dL (ref 6–23)
BUN: 14 mg/dL (ref 6–23)
CO2: 24 mEq/L (ref 19–32)
Calcium: 8.7 mg/dL (ref 8.4–10.5)
Calcium: 9 mg/dL (ref 8.4–10.5)
Chloride: 100 mEq/L (ref 96–112)
Creatinine, Ser: 0.74 mg/dL (ref 0.50–1.35)
Creatinine, Ser: 0.8 mg/dL (ref 0.50–1.35)
GFR calc Af Amer: >90 mL/min (ref >90)
GFR calc Af Amer: >90 mL/min (ref >90)
GFR calc non Af Amer: 87 mL/min — ABNORMAL LOW (ref >90)
GFR calc non Af Amer: 84 mL/min — ABNORMAL LOW (ref >90)
Glucose, Bld: 142 mg/dL — ABNORMAL HIGH (ref 70–99)
Glucose, Bld: 127 mg/dL — ABNORMAL HIGH (ref 70–99)
Potassium: 4.2 mEq/L (ref 3.5–5.1)

## 2013-05-14 LAB — GLUCOSE, CAPILLARY: Glucose-Capillary: 133 mg/dL — ABNORMAL HIGH (ref 70–99)

## 2013-05-14 MED ORDER — LEVETIRACETAM 750 MG PO TABS
1500.0000 mg | ORAL_TABLET | Freq: Two times a day (BID) | ORAL | Status: DC
  Administered 2013-05-14 – 2013-05-18 (×9): 1500 mg via ORAL
  Filled 2013-05-14 (×10): qty 2

## 2013-05-14 MED ORDER — LORAZEPAM 2 MG/ML IJ SOLN
1.0000 mg | Freq: Once | INTRAMUSCULAR | Status: AC
  Administered 2013-05-14: 1 mg via INTRAVENOUS
  Filled 2013-05-14: qty 1

## 2013-05-14 MED ORDER — SIMVASTATIN 10 MG PO TABS
10.0000 mg | ORAL_TABLET | Freq: Every day | ORAL | Status: DC
  Administered 2013-05-14 – 2013-05-17 (×4): 10 mg via ORAL
  Filled 2013-05-14 (×5): qty 1

## 2013-05-14 MED ORDER — PANTOPRAZOLE SODIUM 40 MG PO TBEC
40.0000 mg | DELAYED_RELEASE_TABLET | Freq: Every day | ORAL | Status: DC
  Administered 2013-05-14 – 2013-05-18 (×5): 40 mg via ORAL
  Filled 2013-05-14 (×5): qty 1

## 2013-05-14 MED ORDER — SODIUM CHLORIDE 0.9 % IV SOLN
INTRAVENOUS | Status: DC
  Administered 2013-05-14 – 2013-05-17 (×4): via INTRAVENOUS

## 2013-05-14 MED ORDER — LORAZEPAM 2 MG/ML IJ SOLN
0.5000 mg | Freq: Four times a day (QID) | INTRAMUSCULAR | Status: DC | PRN
  Administered 2013-05-14: 0.05 mg via INTRAVENOUS
  Administered 2013-05-15 (×2): 0.5 mg via INTRAVENOUS
  Filled 2013-05-14 (×3): qty 1

## 2013-05-14 MED ORDER — LIDOCAINE-EPINEPHRINE 2 %-1:100000 IJ SOLN: 20.0000 mL | Freq: Once | INTRAMUSCULAR | Status: DC

## 2013-05-14 MED ORDER — ADULT MULTIVITAMIN W/MINERALS CH
1.0000 | ORAL_TABLET | Freq: Every day | ORAL | Status: DC
  Administered 2013-05-14 – 2013-05-18 (×5): 1 via ORAL
  Filled 2013-05-14 (×5): qty 1

## 2013-05-14 MED ORDER — LEVOTHYROXINE SODIUM 50 MCG PO TABS
50.0000 ug | ORAL_TABLET | Freq: Every day | ORAL | Status: DC
  Administered 2013-05-14 – 2013-05-18 (×5): 50 ug via ORAL
  Filled 2013-05-14 (×6): qty 1

## 2013-05-14 MED ORDER — LIDOCAINE-EPINEPHRINE 2 %-1:100000 IJ SOLN
INTRAMUSCULAR | Status: AC
  Filled 2013-05-14: qty 1

## 2013-05-14 MED ORDER — SODIUM CHLORIDE 0.9 % IV SOLN
1500.0000 mg | Freq: Once | INTRAVENOUS | Status: AC
  Administered 2013-05-14: 1500 mg via INTRAVENOUS
  Filled 2013-05-14: qty 15

## 2013-05-14 MED ORDER — METFORMIN HCL 500 MG PO TABS
500.0000 mg | ORAL_TABLET | Freq: Two times a day (BID) | ORAL | Status: DC
  Administered 2013-05-14 – 2013-05-18 (×8): 500 mg via ORAL
  Filled 2013-05-14 (×11): qty 1

## 2013-05-14 NOTE — Progress Notes (Signed)
CARE MANAGEMENT NOTE 05/14/2013  Patient:  Gerald Leach, Gerald Leach   Account Number:  [id number]  Date Initiated:  05/14/2013  Documentation initiated by:  Kathrynne Kulinski  Subjective/Objective Assessment:   pt with hx of seizures now having several witnessed and requiring iv keppra     Action/Plan:   home when stable   Anticipated DC Date:  05/17/2013   Anticipated DC Plan:  HOME/SELF CARE  In-house referral  NA      DC Planning Services  NA      [facility] Choice  NA   Choice offered to / List presented to:  NA   DME arranged  NA      DME agency  NA     HH arranged  NA  NA      HH agency  NA   Status of service:  In process, will continue to follow Medicare Important Message given?  NA - LOS <3 / Initial given by admissions (If response is "NO", the following Medicare IM given date fields will be blank) Date Medicare IM given:   Date Additional Medicare IM given:    Discharge Disposition:    Per UR Regulation:  Reviewed for med. necessity/level of care/duration of stay  If discussed at Long Length of Stay Meetings, dates discussed:    Comments:  [id number]/Ellasyn Swilling Lorrin Mais: Case management [phone] Chart reviewed and updated.  Next chart review due on [id number]. Needs for discharge at time of review:  none

## 2013-05-14 NOTE — Progress Notes (Signed)
Pt Left Elbow sutured by EDMD, Hemastatis achieved. Pressure bandage applied. Pt has good cap refil, 95% sat reading to left thumb on pulsoximetry to verify adequate blood flow to extremity.

## 2013-05-14 NOTE — Consult Note (Signed)
Neurology Consultation Reason for Consult: Seizures Referring Physician: Penny Pia  CC: Seizures  History is obtained from: Patient  HPI: Gerald Leach is a 79 y.o. male with a history of seizures since a subdural hematoma in April of this year who presents with breakthrough seizures. He had multiple generalized seizures yesterday and therefore was admitted. He takes Keppra 1000 mg twice a day and states that he does not think that he has missed any doses.    ROS: A 14 point ROS was performed and is negative except as noted in the HPI.  Past Medical History  Diagnosis Date  . Hypertension   . Colonic polyp 2003  . GERD (gastroesophageal reflux disease)   . ED (erectile dysfunction)   . Hyperlipidemia   . Hypothyroidism   . Brain aneurysm   . Seizures     per family    Family History: No hx seizures  Social History: Tob: denies  Exam: Current vital signs: BP 138/72  Pulse 78  Temp(Src) 97.5 F (36.4 C) (Oral)  Ht 6' 1.5" (1.867 m)  Wt 83.6 kg (184 lb 4.9 oz)  BMI 23.98 kg/m2  SpO2 96% Vital signs in last 24 hours: Temp:  [97.5 F (36.4 C)-98.5 F (36.9 C)] 97.5 F (36.4 C) (11/27 0215) Pulse Rate:  [77-91] 78 (11/27 0215) BP: (138-163)/(53-110) 138/72 mmHg (11/27 0215) SpO2:  [95 %-96 %] 96 % (11/27 0200) Weight:  [83.6 kg (184 lb 4.9 oz)] 83.6 kg (184 lb 4.9 oz) (11/27 0215)  General: in bed, NAD CV: RRR Mental Status: Patient is awake, alert, oriented to person, place, month, year, and situation. Immediate and remote memory are intact. Patient is able to give a clear and coherent history. No signs of aphasia or neglect Cranial Nerves: II: Visual Fields are full. Pupils are equal, round, and reactive to light.  Discs are difficult to visualize. III,IV, VI: EOMI without ptosis or diploplia.  V: Facial sensation is symmetric to temperature VII: Facial movement is notable for mild left lower facial weakness VIII: hearing is intact to voice X: Uvula  elevates symmetrically XI: Shoulder shrug is symmetric. XII: tongue is midline without atrophy or fasciculations.  Motor: Tone is normal. Bulk is normal. 5/5 strength was present on the right. On the left, there is 4/5 weakness of the arm and leg Sensory: Sensation is symmetric to light touch and temperature in the arms and legs. Deep Tendon Reflexes: 2+ and symmetric in the biceps and patellae.  Cerebellar: FNF intact on right, consistent with weakness on left Gait: Not tested due to weakness    I have reviewed labs in epic and the results pertinent to this consultation are: cmp on admission - hyponatremia  I have reviewed the images obtained:CT head - no acute findings, previous right cranitomy and meningioma  Impression: 79 yo M with seizures s/p subdural hematoma. He had breakthrough seizures and does not think that he has missed any doses. At this time, I would favor increasing keppra to 1500mg  BID.   Recommendations: 1) Keppra 1500mg  BID 2) Please call with any further questions or if the patient has recurrent seizures. Otherwise neurology will sign off.   Ritta Slot, MD [facility] [phone]  If 7pm- 7am, please page neurology on call at [phone].

## 2013-05-14 NOTE — Progress Notes (Addendum)
Patient seen and evaluated earlier this morning. Please refer to my associates H and P for further details regarding assessment and plan.  Neurology consulted and they will round on patient today.  Would like to thank Dr. Amada Jupiter for his input in this case.  Will reassess next am. Plan is for improvement in sodium levels and f/u with neurology recommendations.  Gerald Leach  Addendum: Will obtain sodium levels since patient has had fluid bolus and some saline administration.  Should sodium levels be decreased after saline administration then will obtain further lab work looking at sodium levels and creatinine levels to assess for SIADH

## 2013-05-14 NOTE — Progress Notes (Signed)
Patient trying to get out of without assistance insisting that he is not at the hospital, and being held against he will. Writer tried to redirect patient multiple times which upset  he patient. Patient continues to throw legs over side rails and attempts to get up. NP notified at 0305 order for IV ativan given. Patient medicated and rest well at this time will continue to monitor.

## 2013-05-14 NOTE — H&P (Signed)
[facility] History and Physical  Gerald Leach ZOX:[id number] DOB: 07/08/1933 DOA: 05/13/2013  Referring physician: ED PCP: Evette Georges, MD  Chief Complaint: Seizure  HPI: Gerald Leach is a 79 y.o. male who presents to the ED after a grand mal seizure witnessed by his Daughter.  The patient is also actively having a grand mall seizure on arrival witnessed by ED staff and EDP.  He also has a 1-2 inch elbow laceration on his L elbow.  Seizure was stopped with 2mg  of ativan and patient is now slowly waking up though he is not cooperative at this time.  Review of Systems: Patient has no complaints other than he wants to go home.  Past Medical History  Diagnosis Date  . Hypertension   . Colonic polyp 2003  . GERD (gastroesophageal reflux disease)   . ED (erectile dysfunction)   . Hyperlipidemia   . Hypothyroidism   . Brain aneurysm   . Seizures     per family   Past Surgical History  Procedure Laterality Date  . Brain aneurysm surgery      at [facility]  . Carotid artery aneurysm    . Colonoscopy      polyps  . Right hernia    . Craniotomy Right 09/19/2012    Procedure: CRANIOTOMY HEMATOMA EVACUATION SUBDURAL;  Surgeon: Clydene Fake, MD;  Location: [facility];  Service: Neurosurgery;  Laterality: Right;  . Craniotomy Right 09/22/2012    Procedure: CRANIOTOMY HEMATOMA EVACUATION SUBDURAL;  Surgeon: Clydene Fake, MD;  Location: [facility];  Service: Neurosurgery;  Laterality: Right;   Social History:  reports that he has never smoked. He has never used smokeless tobacco. He reports that he drinks alcohol. He reports that he does not use illicit drugs.   No Known Allergies  Family History  Problem Relation Age of Onset  . Liver disease Brother     Died    Prior to Admission medications   Medication Sig Start Date End Date Taking? Authorizing Provider  levETIRAcetam (KEPPRA) 1000 MG tablet Take 1,000 mg by mouth 2 (two) times daily.   Yes Historical  Provider, MD  levothyroxine (SYNTHROID, LEVOTHROID) 50 MCG tablet Take 1 tablet (50 mcg total) by mouth daily before breakfast. 10/10/12  Yes Mcarthur Rossetti Angiulli, PA-C  metFORMIN (GLUCOPHAGE) 500 MG tablet Take 1 tablet (500 mg total) by mouth 2 (two) times daily with a meal. 10/10/12  Yes Daniel J Angiulli, PA-C  Multiple Vitamin (MULTIVITAMIN WITH MINERALS) TABS Take 1 tablet by mouth daily. 10/10/12  Yes Daniel J Angiulli, PA-C  omeprazole (PRILOSEC) 20 MG capsule Take 20 mg by mouth every Monday, Wednesday, and Friday.  01/06/13  Yes Historical Provider, MD  simvastatin (ZOCOR) 10 MG tablet Take 1 tablet (10 mg total) by mouth at bedtime. 10/10/12  Yes Daniel J Angiulli, PA-C  ONE TOUCH LANCETS MISC 1 each by Does not apply route daily. 06/27/11   Roderick Pee, MD   Physical Exam: Filed Vitals:   05/13/13 2151  BP: 140/88  Pulse: 77  Temp:     General:  NAD, resting comfortably in bed Eyes: PEERLA EOMI ENT: mucous membranes moist Neck: supple w/o JVD Cardiovascular: RRR w/o MRG Respiratory: CTA B Abdomen: soft, nt, nd, bs+ Skin: no rash nor lesion Musculoskeletal: MAE, full ROM all 4 extremities Psychiatric: paranoid tone and affect, patient continues to insist that he is NOT at [facility], that [place], and we are  all impersonating doctors despite multiple attempts by ourselves and daughter to tell him otherwise.  He then states that we have brainwashed his daughter. Neurologic: Oriented to self, not oriented to time nor location, facial droop from prior stroke  Labs on Admission:  Basic Metabolic Panel:  Recent Labs Lab 05/13/13 2115  NA 128*  K 4.3  CL 95*  CO2 21  GLUCOSE 142*  BUN 13  CREATININE 0.94  CALCIUM 8.9   Liver Function Tests:  Recent Labs Lab 05/13/13 2115  AST 14  ALT 6  ALKPHOS 75  BILITOT 0.3  PROT 6.8  ALBUMIN 3.4*   No results found for this basename: LIPASE, AMYLASE,  in the last 168 hours No results found for this  basename: AMMONIA,  in the last 168 hours CBC:  Recent Labs Lab 05/13/13 2115  WBC 10.8*  NEUTROABS 7.9*  HGB 13.4  HCT 38.7*  MCV 81.5  PLT 197   Cardiac Enzymes: No results found for this basename: CKTOTAL, CKMB, CKMBINDEX, TROPONINI,  in the last 168 hours  BNP (last 3 results) No results found for this basename: PROBNP,  in the last 8760 hours CBG: No results found for this basename: GLUCAP,  in the last 168 hours  Radiological Exams on Admission: Ct Head Wo Contrast  05/13/2013   CLINICAL DATA:  Fall, seizure.  EXAM: CT HEAD WITHOUT CONTRAST  CT CERVICAL SPINE WITHOUT CONTRAST  TECHNIQUE: Multidetector CT imaging of the head and cervical spine was performed following the standard protocol without intravenous contrast. Multiplanar CT image reconstructions of the cervical spine were also generated.  COMPARISON:  CT scan of April 15, 2013. MRI scan of May 01, 2013.  FINDINGS: CT HEAD FINDINGS  Status post right parietal craniotomy. Mild diffuse cortical atrophy is noted. Chronic ischemic white matter disease is noted. Ventricular size is unchanged compared to prior exam and consistent with degree of atrophy present. 2.3 x 1.4 cm calcified mass is again noted superior to the right clinoid process which was demonstrated to represent meningioma on prior MRI exam. There is no evidence of acute infarction or hemorrhage seen currently.  CT CERVICAL SPINE FINDINGS  No fracture or spondylolisthesis is noted. Mild degenerative disc disease is noted at C3-4 and C4-5. Minimal hypertrophy of posterior facet joints is seen at multiple levels.  IMPRESSION: Status post right parietal craniotomy. Mild diffuse cortical atrophy and chronic ischemic white matter disease. No change seen involving 2.3 cm calcified mass arising from right clinoid process consistent with meningioma as described on prior MRI exam. No acute intracranial abnormality is noted.  Degenerative changes are noted in the cervical  spine. No acute abnormality seen.   Electronically Signed   By: Roque Lias M.D.   On: 05/13/2013 22:03   Ct Cervical Spine Wo Contrast  05/13/2013   CLINICAL DATA:  Fall, seizure.  EXAM: CT HEAD WITHOUT CONTRAST  CT CERVICAL SPINE WITHOUT CONTRAST  TECHNIQUE: Multidetector CT imaging of the head and cervical spine was performed following the standard protocol without intravenous contrast. Multiplanar CT image reconstructions of the cervical spine were also generated.  COMPARISON:  CT scan of April 15, 2013. MRI scan of May 01, 2013.  FINDINGS: CT HEAD FINDINGS  Status post right parietal craniotomy. Mild diffuse cortical atrophy is noted. Chronic ischemic white matter disease is noted. Ventricular size is unchanged compared to prior exam and consistent with degree of atrophy present. 2.3 x 1.4 cm calcified mass is again noted superior to the right clinoid process  which was demonstrated to represent meningioma on prior MRI exam. There is no evidence of acute infarction or hemorrhage seen currently.  CT CERVICAL SPINE FINDINGS  No fracture or spondylolisthesis is noted. Mild degenerative disc disease is noted at C3-4 and C4-5. Minimal hypertrophy of posterior facet joints is seen at multiple levels.  IMPRESSION: Status post right parietal craniotomy. Mild diffuse cortical atrophy and chronic ischemic white matter disease. No change seen involving 2.3 cm calcified mass arising from right clinoid process consistent with meningioma as described on prior MRI exam. No acute intracranial abnormality is noted.  Degenerative changes are noted in the cervical spine. No acute abnormality seen.   Electronically Signed   By: Roque Lias M.D.   On: 05/13/2013 22:03    EKG: Independently reviewed.  Assessment/Plan Principal Problem:   Seizure Active Problems:   Seizure disorder   1. Seizure - breakthrough seizure despite Keppra 1gm BID, spoke with Dr. Roseanne Reno on phone, increasing Keppra to 1.5gm BID and  giving first dose tonight IV load.  Patient still disoriented and delusional from seizure. 2. L forearm lac - sutures done in ED, will need to be rechecked, and re-evaluated if this develops a hematoma which is a strong possibility given how much he is moving his arm around.    Code Status: Full (must indicate code status--if unknown or must be presumed, indicate so) Family Communication: Daughter at bedside (indicate person spoken with, if applicable, with phone number if by telephone) Disposition Plan: Admit to obs (indicate anticipated LOS)  Time spent: 70 min  Shavone Nevers M. [facility] Pager [phone]  If 7PM-7AM, please contact night-coverage [email or web address] Password [facility] 05/14/2013, 12:24 AM

## 2013-05-15 LAB — GLUCOSE, CAPILLARY
Glucose-Capillary: 142 mg/dL — ABNORMAL HIGH (ref 70–99)
Glucose-Capillary: 109 mg/dL — ABNORMAL HIGH (ref 70–99)

## 2013-05-15 LAB — BASIC METABOLIC PANEL
BUN: 11 mg/dL (ref 6–23)
CO2: 22 mEq/L (ref 19–32)
Calcium: 8.8 mg/dL (ref 8.4–10.5)
Chloride: 102 mEq/L (ref 96–112)
Creatinine, Ser: 0.66 mg/dL (ref 0.50–1.35)
GFR calc Af Amer: >90 mL/min (ref >90)
GFR calc non Af Amer: >90 mL/min (ref >90)
Glucose, Bld: 148 mg/dL — ABNORMAL HIGH (ref 70–99)
Potassium: 3.5 mEq/L (ref 3.5–5.1)
Sodium: 134 mEq/L — ABNORMAL LOW (ref 135–145)

## 2013-05-15 NOTE — Progress Notes (Signed)
Patient's family desires to speak with social Investment banker, operational regarding home needs for father.   Family states he lives in home that does not have bathroom on first floor, 8 steps to climb to second floor, patient's wife is in a SNF currently, interested in the mobile chair that moves between first and second floors that he could sit in, [facility], possible hospital bed, and assistance from NT at home.  Message left for [place], Case Manager that family on floor waiting to discuss these needs.

## 2013-05-15 NOTE — Progress Notes (Signed)
Pt much more alert, feed himself dinner-he did scatter food everywhere, but great improvement from this am. Pt states "I think I had my Keppra confused, as Britta Mccreedy used to fix it in little jars". "Britta Mccreedy is now at nursing home because she fell and broke her hip". Reassured that he is doing better.

## 2013-05-15 NOTE — Progress Notes (Addendum)
[facility] PROGRESS NOTE  Gerald Leach XBJ:[id number] DOB: 1933-12-05 DOA: 05/13/2013 PCP: Evette Georges, MD  Assessment/Plan: 1. Seizure disorder - Neurology evaluated and placed on Keppra 1500 mg po BID - No seizure like activity  2. Delirium - At this point most likely developed delirium secondary to Ativan and Keppra administration.  - I will discontinue Ativan - Once able hold discontinue restraints - Place order for sitter - If no improvement off ativan would consider psychiatry consultation.  Addendum 3. Hypothyroidism - stable, continuing home regimen  4. DM - Will place on diabetic diet - Continue Metformin  Code Status: full Family Communication: discussed with daughter Disposition Plan: Pending improvement in condition and resolution of delirium   Consultants:  Neurology  Procedures:  CT head w/o contrast  CT cervical spine  Antibiotics:  None  HPI/Subjective: No new seizure activity reported. Discuss case with daughter who mentions that patient is currently confused and not himself. They are concerned about disposition once patient condition improved  Objective: Filed Vitals:   05/15/13 0650  BP: 148/95  Pulse: 78  Temp: 97.8 F (36.6 C)  Resp: 18    Intake/Output Summary (Last 24 hours) at 05/15/13 1128 Last data filed at 05/14/13 1747  Gross per 24 hour  Intake    480 ml  Output      0 ml  Net    480 ml   Filed Weights   05/14/13 0215 05/14/13 2000  Weight: 83.6 kg (184 lb 4.9 oz) 83.6 kg (184 lb 4.9 oz)    Exam:   General:  Pt in NAD, Alert and awake  Cardiovascular: RRR, no MRG  Respiratory: CTA BL, no wheezes  Abdomen: soft, NT, ND  Musculoskeletal: warm and dry   Data Reviewed: Basic Metabolic Panel:  Recent Labs Lab 05/13/13 2115 05/14/13 1219 05/14/13 2156 05/15/13 0805  NA 128* 134* 135 134*  K 4.3 4.2 4.0 3.5  CL 95* 100 101 102  CO2 21 23 24 22   GLUCOSE 142* 142* 127* 148*  BUN 13 10 14  11   CREATININE 0.94 0.74 0.80 0.66  CALCIUM 8.9 8.7 9.0 8.8   Liver Function Tests:  Recent Labs Lab 05/13/13 2115  AST 14  ALT 6  ALKPHOS 75  BILITOT 0.3  PROT 6.8  ALBUMIN 3.4*   No results found for this basename: LIPASE, AMYLASE,  in the last 168 hours No results found for this basename: AMMONIA,  in the last 168 hours CBC:  Recent Labs Lab 05/13/13 2115  WBC 10.8*  NEUTROABS 7.9*  HGB 13.4  HCT 38.7*  MCV 81.5  PLT 197   Cardiac Enzymes: No results found for this basename: CKTOTAL, CKMB, CKMBINDEX, TROPONINI,  in the last 168 hours BNP (last 3 results) No results found for this basename: PROBNP,  in the last 8760 hours CBG:  Recent Labs Lab 05/14/13 0916 05/14/13 1342 05/14/13 1733 05/14/13 2121 05/15/13 0803  GLUCAP 112* 146* 143* 133* 142*    No results found for this or any previous visit (from the past 240 hour(s)).   Studies: Ct Head Wo Contrast  05/13/2013   CLINICAL DATA:  Fall, seizure.  EXAM: CT HEAD WITHOUT CONTRAST  CT CERVICAL SPINE WITHOUT CONTRAST  TECHNIQUE: Multidetector CT imaging of the head and cervical spine was performed following the standard protocol without intravenous contrast. Multiplanar CT image reconstructions of the cervical spine were also generated.  COMPARISON:  CT scan of April 15, 2013. MRI scan of May 01, 2013.  FINDINGS: CT HEAD FINDINGS  Status post right parietal craniotomy. Mild diffuse cortical atrophy is noted. Chronic ischemic white matter disease is noted. Ventricular size is unchanged compared to prior exam and consistent with degree of atrophy present. 2.3 x 1.4 cm calcified mass is again noted superior to the right clinoid process which was demonstrated to represent meningioma on prior MRI exam. There is no evidence of acute infarction or hemorrhage seen currently.  CT CERVICAL SPINE FINDINGS  No fracture or spondylolisthesis is noted. Mild degenerative disc disease is noted at C3-4 and C4-5. Minimal  hypertrophy of posterior facet joints is seen at multiple levels.  IMPRESSION: Status post right parietal craniotomy. Mild diffuse cortical atrophy and chronic ischemic white matter disease. No change seen involving 2.3 cm calcified mass arising from right clinoid process consistent with meningioma as described on prior MRI exam. No acute intracranial abnormality is noted.  Degenerative changes are noted in the cervical spine. No acute abnormality seen.   Electronically Signed   By: Roque Lias M.D.   On: 05/13/2013 22:03   Ct Cervical Spine Wo Contrast  05/13/2013   CLINICAL DATA:  Fall, seizure.  EXAM: CT HEAD WITHOUT CONTRAST  CT CERVICAL SPINE WITHOUT CONTRAST  TECHNIQUE: Multidetector CT imaging of the head and cervical spine was performed following the standard protocol without intravenous contrast. Multiplanar CT image reconstructions of the cervical spine were also generated.  COMPARISON:  CT scan of April 15, 2013. MRI scan of May 01, 2013.  FINDINGS: CT HEAD FINDINGS  Status post right parietal craniotomy. Mild diffuse cortical atrophy is noted. Chronic ischemic white matter disease is noted. Ventricular size is unchanged compared to prior exam and consistent with degree of atrophy present. 2.3 x 1.4 cm calcified mass is again noted superior to the right clinoid process which was demonstrated to represent meningioma on prior MRI exam. There is no evidence of acute infarction or hemorrhage seen currently.  CT CERVICAL SPINE FINDINGS  No fracture or spondylolisthesis is noted. Mild degenerative disc disease is noted at C3-4 and C4-5. Minimal hypertrophy of posterior facet joints is seen at multiple levels.  IMPRESSION: Status post right parietal craniotomy. Mild diffuse cortical atrophy and chronic ischemic white matter disease. No change seen involving 2.3 cm calcified mass arising from right clinoid process consistent with meningioma as described on prior MRI exam. No acute intracranial  abnormality is noted.  Degenerative changes are noted in the cervical spine. No acute abnormality seen.   Electronically Signed   By: Roque Lias M.D.   On: 05/13/2013 22:03    Scheduled Meds: . levETIRAcetam  1,500 mg Oral BID  . levothyroxine  50 mcg Oral QAC breakfast  . metFORMIN  500 mg Oral BID WC  . multivitamin with minerals  1 tablet Oral Daily  . pantoprazole  40 mg Oral Daily  . simvastatin  10 mg Oral QHS   Continuous Infusions: . sodium chloride 75 mL/hr at 05/14/13 1809    Principal Problem:   Seizure Active Problems:   Seizure disorder   Seizures, generalized convulsive    Time spent: > 35 minutes    Penny Pia  [facility] Pager [phone]. If 7PM-7AM, please contact night-coverage at [email or web address], password [facility] 05/15/2013, 11:28 AM  LOS: 2 days

## 2013-05-15 NOTE — ED Provider Notes (Signed)
Medical screening examination/treatment/procedure(s) were conducted as a shared visit with non-physician practitioner(s) and myself.  I personally evaluated the patient during the encounter.  EKG Interpretation    Date/Time:  Wednesday May 13 2013 20:43:20 EST Ventricular Rate:  87 PR Interval:  196 QRS Duration: 97 QT Interval:  376 QTC Calculation: 452 R Axis:   -14 Text Interpretation:  sinus rhythm Borderline low voltage, extremity leads nonspecific t wave changes No significant change since last tracing Confirmed by [facility]  MD, Wilbon Obenchain [phone]) on 05/13/2013 9:49:47 PM          Pt seen and evaluated.  He has hx of seizures and has had at least 3 seizures today.  Workup reassuring.  Laceration to left forearm repaired.  Pt awake, somnolent, answers some questions but is slow to respond.  Moving all extremities.  Pt is slow to return to his baseline.  Pt admitted for observation.     Ethelda Chick, MD 05/15/13 [phone]

## 2013-05-15 NOTE — Care Management Note (Signed)
Cm spoke with patient's daughter Zella Ball and her spouse at the bedside concerning discharge planning. Pt's daughter concerned about pt's safety and ability to care for self upon discharge. Cm informed pt's daughter that pt disposition depends on PT eval. Pt unable to participate in a PT eval at this time due to mental status. Family had questions concerning mobile lift chair. Cm instructed pt's family to contact pt's insurance company or research dme online. Cm informed family CM can assist with arrange other dme such as a hospital bed or wheelchair. Cm provided pt's daughter with information concerning private duty care agencies. Will continue to follow.    Roxy Manns Bolton Canupp,MSN,RN [phone]

## 2013-05-15 NOTE — Progress Notes (Signed)
Coban removed from L elbow (AC area) as tissue around coban was with edema. When coban removed blisters and denuded skin noted.  Cleaned with NS and Tegaderm applied.

## 2013-05-15 NOTE — Progress Notes (Signed)
Nutrition Brief Note  Patient identified for having low braden score.   Wt Readings from Last 5 Encounters:  05/14/13 184 lb 4.9 oz (83.6 kg)  05/05/13 190 lb 8 oz (86.41 kg)  04/21/13 190 lb 11.2 oz (86.501 kg)  03/31/13 188 lb (85.276 kg)  03/24/13 190 lb (86.183 kg)    Body mass index is 23.98 kg/(m^2). Patient meets criteria for normal weight based on current BMI.   Current diet order is regular, patient is consuming approximately 100% of meals at this time. Labs and medications reviewed. Admitted with seizure. Met with pt who reports good appetite PTA, eats 3 meals/day with stable weight. Eating excellent.   No nutrition interventions warranted at this time. If nutrition issues arise, please consult RD.   Levon Hedger MS, RD, LDN [phone] Pager [phone] After Hours Pager

## 2013-05-16 DIAGNOSIS — F19921 Other psychoactive substance use, unspecified with intoxication with delirium: Secondary | ICD-10-CM

## 2013-05-16 DIAGNOSIS — R569 Unspecified convulsions: Secondary | ICD-10-CM

## 2013-05-16 DIAGNOSIS — IMO0001 Reserved for inherently not codable concepts without codable children: Secondary | ICD-10-CM

## 2013-05-16 DIAGNOSIS — E1165 Type 2 diabetes mellitus with hyperglycemia: Secondary | ICD-10-CM

## 2013-05-16 DIAGNOSIS — R410 Disorientation, unspecified: Secondary | ICD-10-CM | POA: Diagnosis present

## 2013-05-16 DIAGNOSIS — T50905A Adverse effect of unspecified drugs, medicaments and biological substances, initial encounter: Secondary | ICD-10-CM | POA: Diagnosis present

## 2013-05-16 HISTORY — DX: Disorientation, unspecified: R41.0

## 2013-05-16 HISTORY — DX: Adverse effect of unspecified drugs, medicaments and biological substances, initial encounter: T50.905A

## 2013-05-16 LAB — GLUCOSE, CAPILLARY
Glucose-Capillary: 121 mg/dL — ABNORMAL HIGH (ref 70–99)
Glucose-Capillary: 117 mg/dL — ABNORMAL HIGH (ref 70–99)
Glucose-Capillary: 121 mg/dL — ABNORMAL HIGH (ref 70–99)

## 2013-05-16 NOTE — Progress Notes (Signed)
[facility] PROGRESS NOTE  Gerald Leach RUE:[id number] DOB: [birth year] DOA: 05/13/2013 PCP: Evette Georges, MD  Assessment/Plan: 1. Seizure disorder - Neurology evaluated and placed on Keppra 1500 mg po BID - No seizure like activity  2. Delirium - At this point most likely developed delirium secondary to Ativan and Keppra administration.  - I will discontinue Ativan - Once able hold discontinue restraints - Place order for sitter - If no improvement off ativan would consider psychiatry consultation.  3. Hypothyroidism - stable, continuing home regimen  4. DM - Will place on diabetic diet - Continue Metformin  5. drug-induced delirium - Improved off of benzodiazepine. Would continue to avoid benzodiazepine. Suspect that in combination with increased dose of Keppra most likely lead to drug-induced delirium. - Will reassess next a.m. if patient is still having delirium would consider consulting neurology and psychiatry for further evaluation and recommendations  Code Status: full Family Communication: No family at bedside Disposition Plan: Pending improvement in condition and resolution of delirium   Consultants:  Neurology  Procedures:  CT head w/o contrast  CT cervical spine  Antibiotics:  None  HPI/Subjective: No seizure-like activity reported. Patient still confused but improved from yesterday  Objective: Filed Vitals:   05/16/13 0501  BP: 148/92  Pulse: 97  Temp: 97.9 F (36.6 C)  Resp: 22    Intake/Output Summary (Last 24 hours) at 05/16/13 1203 Last data filed at 05/16/13 0825  Gross per 24 hour  Intake   1305 ml  Output   2600 ml  Net  -1295 ml   Filed Weights   05/14/13 0215 05/14/13 2000  Weight: 83.6 kg (184 lb 4.9 oz) 83.6 kg (184 lb 4.9 oz)    Exam:   General:  Pt in NAD, Alert and awake. Not oriented to place or time but oriented to self  Cardiovascular: RRR, no MRG  Respiratory: CTA BL, no wheezes  Abdomen: soft,  NT, ND  Musculoskeletal: warm and dry   Data Reviewed: Basic Metabolic Panel:  Recent Labs Lab 05/13/13 2115 05/14/13 1219 05/14/13 2156 05/15/13 0805  NA 128* 134* 135 134*  K 4.3 4.2 4.0 3.5  CL 95* 100 101 102  CO2 21 23 24 22   GLUCOSE 142* 142* 127* 148*  BUN 13 10 14 11   CREATININE 0.94 0.74 0.80 0.66  CALCIUM 8.9 8.7 9.0 8.8   Liver Function Tests:  Recent Labs Lab 05/13/13 2115  AST 14  ALT 6  ALKPHOS 75  BILITOT 0.3  PROT 6.8  ALBUMIN 3.4*   No results found for this basename: LIPASE, AMYLASE,  in the last 168 hours No results found for this basename: AMMONIA,  in the last 168 hours CBC:  Recent Labs Lab 05/13/13 2115  WBC 10.8*  NEUTROABS 7.9*  HGB 13.4  HCT 38.7*  MCV 81.5  PLT 197   Cardiac Enzymes: No results found for this basename: CKTOTAL, CKMB, CKMBINDEX, TROPONINI,  in the last 168 hours BNP (last 3 results) No results found for this basename: PROBNP,  in the last 8760 hours CBG:  Recent Labs Lab 05/15/13 0803 05/15/13 1157 05/15/13 2112 05/16/13 0745 05/16/13 1130  GLUCAP 142* 107* 109* 121* 117*    No results found for this or any previous visit (from the past 240 hour(s)).   Studies: No results found.  Scheduled Meds: . levETIRAcetam  1,500 mg Oral BID  . levothyroxine  50 mcg Oral QAC breakfast  . metFORMIN  500 mg Oral BID WC  .  multivitamin with minerals  1 tablet Oral Daily  . pantoprazole  40 mg Oral Daily  . simvastatin  10 mg Oral QHS   Continuous Infusions: . sodium chloride 75 mL/hr at 05/15/13 2134    Principal Problem:   Seizure Active Problems:   Seizure disorder   Seizures, generalized convulsive   Drug-induced delirium(292.81)    Time spent: > 35 minutes    Penny Pia  [facility] Pager [phone]. If 7PM-7AM, please contact night-coverage at [email or web address], password [facility] 05/16/2013, 12:03 PM  LOS: 3 days

## 2013-05-17 DIAGNOSIS — G40309 Generalized idiopathic epilepsy and epileptic syndromes, not intractable, without status epilepticus: Principal | ICD-10-CM

## 2013-05-17 DIAGNOSIS — F19921 Other psychoactive substance use, unspecified with intoxication with delirium: Secondary | ICD-10-CM

## 2013-05-17 LAB — COMPREHENSIVE METABOLIC PANEL
ALT: 10 U/L (ref 0–53)
AST: 10 U/L (ref 0–37)
Albumin: 3 g/dL — ABNORMAL LOW (ref 3.5–5.2)
CO2: 23 mEq/L (ref 19–32)
Calcium: 8.9 mg/dL (ref 8.4–10.5)
Creatinine, Ser: 0.87 mg/dL (ref 0.50–1.35)
GFR calc non Af Amer: 81 mL/min — ABNORMAL LOW (ref >90)
Sodium: 134 mEq/L — ABNORMAL LOW (ref 135–145)
Total Protein: 6.3 g/dL (ref 6.0–8.3)

## 2013-05-17 LAB — CBC WITH DIFFERENTIAL/PLATELET
Eosinophils Absolute: 0.2 10*3/uL (ref 0.0–0.7)
Eosinophils Relative: 2 % (ref 0–5)
HCT: 34.1 % — ABNORMAL LOW (ref 39.0–52.0)
Hemoglobin: 11.5 g/dL — ABNORMAL LOW (ref 13.0–17.0)
Lymphs Abs: 2.4 10*3/uL (ref 0.7–4.0)
MCH: 27.6 pg (ref 26.0–34.0)
MCHC: 33.7 g/dL (ref 30.0–36.0)
MCV: 81.8 fL (ref 78.0–100.0)
Monocytes Absolute: 0.9 10*3/uL (ref 0.1–1.0)
Monocytes Relative: 10 % (ref 3–12)
Neutrophils Relative %: 59 % (ref 43–77)
RBC: 4.17 MIL/uL — ABNORMAL LOW (ref 4.22–5.81)

## 2013-05-17 LAB — GLUCOSE, CAPILLARY
Glucose-Capillary: 110 mg/dL — ABNORMAL HIGH (ref 70–99)
Glucose-Capillary: 96 mg/dL (ref 70–99)
Glucose-Capillary: 142 mg/dL — ABNORMAL HIGH (ref 70–99)

## 2013-05-17 NOTE — Progress Notes (Signed)
[facility] PROGRESS NOTE  Gerald Leach:[id number] DOB: September 30, 1933 DOA: 05/13/2013 PCP: Evette Georges, MD  Assessment/Plan: Seizure disorder - Neurology evaluated and placed on Keppra 1500 mg po BID; continue regimen. Contact neurology if further seizures occur  - No seizure like activity   Delirium  - At this point most likely developed delirium secondary to Ativan and Keppra administration.  - Today appears to have cleared, however RNs inform me patient still has waxing and waning episodes of delirium (baseline?).  - Currently no need for sitter or restraints    Hypothyroidism  - Since uncontrolled thyroid disease can play into seizure activity we'll obtain TSH/free T4  -In meantime continue home regimen   DM  - Well-controlled on current diabetic regimen   - Continue Metformin   Drug-induced delirium  - Improved off of benzodiazepine. Would continue to avoid benzodiazepine.   Psychosocial -Per nursing wife currently in rehabilitation facility secondary to hip replacement and we'll not be able to care for husband upon discharge -Daughter was only in town visiting family for holiday we'll not be able to care for father -Will need to consider getting NCM involved in the a.m. to discuss placement of patient when stable  Code Status: full  Family Communication: No family at bedside  Disposition Plan: Pending improvement in condition and resolution of delirium      Consultants:  Neurology (Dr. Onalee Hua); have signed off  Procedures:    Antibiotics:    HPI/Subjective: Gerald WOJDYLA is a 79 y.o WM PMHx  HTN, HLD, hypothyroidism, diabetes type 2, new onset seizures since a subdural hematoma in April 2014. Presented to the ED after a grand mal seizure witnessed by his Daughter. The Pt is also actively having a grand mall seizure on arrival witnessed by ED staff and EDP. He also has a 1-2 inch elbow laceration on his L elbow. Seizure was stopped  with 2mg  of ativan and patient is now slowly waking up though he is not cooperative at this time. 05/15/2013 No new seizure activity reported. Discuss case with daughter who mentions that patient is currently confused and not himself. They are concerned about disposition once patient condition improved. 05/16/2013 No seizure-like activity reported. Patient still confused but improved from yesterday TODAY no new seizure activity reported. Patient sitting in bed comfortably and eating his lunch. A./O. x4     Objective: Filed Vitals:   05/16/13 1325 05/16/13 2228 05/17/13 0500 05/17/13 1352  BP: 143/87 145/75 151/94 135/81  Pulse: 86 83 73 86  Temp: 98.1 F (36.7 C) 98.3 F (36.8 C) 98.5 F (36.9 C) 97.9 F (36.6 C)  TempSrc: Oral Oral Oral Oral  Resp: 20 20  20   Height:      Weight:      SpO2: 100% 100% 97% 97%    Intake/Output Summary (Last 24 hours) at 05/17/13 2059 Last data filed at 05/17/13 1800  Gross per 24 hour  Intake 2252.5 ml  Output   2050 ml  Net  202.5 ml   Filed Weights   05/14/13 0215 05/14/13 2000  Weight: 83.6 kg (184 lb 4.9 oz) 83.6 kg (184 lb 4.9 oz)    Exam:   General:  A./O. x4, NAD, when questioned though about specific medical history patient becomes confused on dates and times of events.  Cardiovascular: Regular in rhythm, negative murmurs rubs gallops, DP/PT pulse 2+ bilateral  Respiratory: Clear to auscultation bilateral  Abdomen: Soft, nontender, nondistended, plus bowel sounds  Musculoskeletal: Negative pedal  edema, multiple healing lacerations on bilateral calves and shins.   Data Reviewed: Basic Metabolic Panel:  Recent Labs Lab 05/13/13 2115 05/14/13 1219 05/14/13 2156 05/15/13 0805  NA 128* 134* 135 134*  K 4.3 4.2 4.0 3.5  CL 95* 100 101 102  CO2 21 23 24 22   GLUCOSE 142* 142* 127* 148*  BUN 13 10 14 11   CREATININE 0.94 0.74 0.80 0.66  CALCIUM 8.9 8.7 9.0 8.8   Liver Function Tests:  Recent Labs Lab 05/13/13 2115   AST 14  ALT 6  ALKPHOS 75  BILITOT 0.3  PROT 6.8  ALBUMIN 3.4*   No results found for this basename: LIPASE, AMYLASE,  in the last 168 hours No results found for this basename: AMMONIA,  in the last 168 hours CBC:  Recent Labs Lab 05/13/13 2115  WBC 10.8*  NEUTROABS 7.9*  HGB 13.4  HCT 38.7*  MCV 81.5  PLT 197   Cardiac Enzymes: No results found for this basename: CKTOTAL, CKMB, CKMBINDEX, TROPONINI,  in the last 168 hours BNP (last 3 results) No results found for this basename: PROBNP,  in the last 8760 hours CBG:  Recent Labs Lab 05/16/13 1130 05/16/13 1652 05/17/13 0810 05/17/13 1223 05/17/13 1636  GLUCAP 117* 121* 110* 96 124*    No results found for this or any previous visit (from the past 240 hour(s)).   Studies: No results found.  Scheduled Meds: . levETIRAcetam  1,500 mg Oral BID  . levothyroxine  50 mcg Oral QAC breakfast  . metFORMIN  500 mg Oral BID WC  . multivitamin with minerals  1 tablet Oral Daily  . pantoprazole  40 mg Oral Daily  . simvastatin  10 mg Oral QHS   Continuous Infusions: . sodium chloride 75 mL/hr at 05/17/13 1541    Principal Problem:   Seizure Active Problems:   HYPOTHYROIDISM   Diabetes mellitus type II, uncontrolled   Seizure disorder   Seizures, generalized convulsive   Drug-induced delirium(292.81)    Time spent: 40 minutes   WOODS, CURTIS, J  [facility] Pager [phone]. If 7PM-7AM, please contact night-coverage at [email or web address], password [facility] 05/17/2013, 8:59 PM  LOS: 4 days

## 2013-05-18 ENCOUNTER — Ambulatory Visit (HOSPITAL_COMMUNITY): Payer: Medicare Other

## 2013-05-18 DIAGNOSIS — I10 Essential (primary) hypertension: Secondary | ICD-10-CM

## 2013-05-18 LAB — CBC WITH DIFFERENTIAL/PLATELET
Basophils Absolute: 0 10*3/uL (ref 0.0–0.1)
Basophils Relative: 0 % (ref 0–1)
Eosinophils Relative: 2 % (ref 0–5)
HCT: 34.2 % — ABNORMAL LOW (ref 39.0–52.0)
Hemoglobin: 11.7 g/dL — ABNORMAL LOW (ref 13.0–17.0)
Lymphocytes Relative: 28 % (ref 12–46)
MCHC: 34.2 g/dL (ref 30.0–36.0)
MCV: 81.6 fL (ref 78.0–100.0)
Monocytes Absolute: 0.9 10*3/uL (ref 0.1–1.0)
Monocytes Relative: 9 % (ref 3–12)
Neutro Abs: 5.9 10*3/uL (ref 1.7–7.7)
Neutrophils Relative %: 61 % (ref 43–77)
RDW: 14.2 % (ref 11.5–15.5)

## 2013-05-18 LAB — COMPREHENSIVE METABOLIC PANEL
ALT: 10 U/L (ref 0–53)
AST: 12 U/L (ref 0–37)
BUN: 13 mg/dL (ref 6–23)
CO2: 22 mEq/L (ref 19–32)
Calcium: 8.8 mg/dL (ref 8.4–10.5)
Chloride: 102 mEq/L (ref 96–112)
Creatinine, Ser: 0.76 mg/dL (ref 0.50–1.35)
GFR calc non Af Amer: 86 mL/min — ABNORMAL LOW (ref >90)
Total Bilirubin: 0.3 mg/dL (ref 0.3–1.2)

## 2013-05-18 LAB — TSH: TSH: 5.801 u[IU]/mL — ABNORMAL HIGH (ref 0.350–4.500)

## 2013-05-18 LAB — T4, FREE: Free T4: 1.13 ng/dL (ref 0.80–1.80)

## 2013-05-18 LAB — GLUCOSE, CAPILLARY
Glucose-Capillary: 97 mg/dL (ref 70–99)
Glucose-Capillary: 115 mg/dL — ABNORMAL HIGH (ref 70–99)

## 2013-05-18 LAB — MAGNESIUM: Magnesium: 1.8 mg/dL (ref 1.5–2.5)

## 2013-05-18 MED ORDER — LEVETIRACETAM 750 MG PO TABS: 1500.0000 mg | ORAL_TABLET | Freq: Two times a day (BID) | ORAL | Status: DC

## 2013-05-18 NOTE — Discharge Summary (Signed)
Physician Discharge Summary  Gerald Leach UEA:[id number] DOB: 03-14-1934 DOA: 05/13/2013  PCP: Evette Georges, MD  Admit date: 05/13/2013 Discharge date: 05/18/2013  Time spent: > 35 minutes  Recommendations for Outpatient Follow-up:  1. Follow up with neurology in 2 weeks  Discharge Diagnoses:  Principal Problem:   Seizure Active Problems:   HYPOTHYROIDISM   HYPERLIPIDEMIA   HYPERTENSION   Diabetes mellitus type II, uncontrolled   Seizure disorder   Seizures, generalized convulsive   Drug-induced delirium(292.81)   Discharge Condition: Stable  Diet recommendation: Diabetic diet, low sodium diet  Filed Weights   05/14/13 0215 05/14/13 2000  Weight: 83.6 kg (184 lb 4.9 oz) 83.6 kg (184 lb 4.9 oz)    History of present illness:  Patient is a 79 year old Caucasian male with history of seizures who presented to the emergency department after having a breakthrough seizure.  Hospital Course:   1. Seizure disorder - Neurology evaluated and placed on Keppra 1500 mg po BID  - No seizure like activity   2. Delirium  - At this point most likely developed delirium secondary to Ativan and Keppra administration.  -Resolved after discontinuation of Ativan    3. Hypothyroidism  - Continuing home regimen on discharge  4. DM  - Will place on diabetic diet  - Continue Metformin   5. drug-induced delirium  - Improved off of benzodiazepine. Would continue to avoid benzodiazepine. Suspect that in combination with increased dose of Keppra most likely lead to drug-induced delirium.   Procedures:  none  Consultations:  Neurology  Discharge Exam: Filed Vitals:   05/18/13 0618  BP: 144/77  Pulse: 86  Temp: 98 F (36.7 C)  Resp: 20    General: Pt in NAD, Alert and awake Cardiovascular: RRR, no MRG Respiratory: CTA BL, no wheezes  Discharge Instructions  Discharge Orders   Future Orders Complete By Expires   Call MD for:  temperature >100.4  As directed     Diet - low sodium heart healthy  As directed    Discharge instructions  As directed    Comments:     Please be sure to follow up with your primary care physician in 1-2 weeks or sooner.  Follow up with the neurologist in 2 weeks or sooner should any new concerns arise.   Increase activity slowly  As directed        Medication List         levETIRAcetam 750 MG tablet  Commonly known as:  KEPPRA  Take 2 tablets (1,500 mg total) by mouth 2 (two) times daily.     levothyroxine 50 MCG tablet  Commonly known as:  SYNTHROID, LEVOTHROID  Take 1 tablet (50 mcg total) by mouth daily before breakfast.     metFORMIN 500 MG tablet  Commonly known as:  GLUCOPHAGE  Take 1 tablet (500 mg total) by mouth 2 (two) times daily with a meal.     multivitamin with minerals Tabs tablet  Take 1 tablet by mouth daily.     omeprazole 20 MG capsule  Commonly known as:  PRILOSEC  Take 20 mg by mouth every Monday, Wednesday, and Friday.     ONE TOUCH LANCETS Misc  1 each by Does not apply route daily.     simvastatin 10 MG tablet  Commonly known as:  ZOCOR  Take 1 tablet (10 mg total) by mouth at bedtime.       No Known Allergies    The results of significant diagnostics from this  hospitalization (including imaging, microbiology, ancillary and laboratory) are listed below for reference.    Significant Diagnostic Studies: Ct Head Wo Contrast  05/13/2013   CLINICAL DATA:  Fall, seizure.  EXAM: CT HEAD WITHOUT CONTRAST  CT CERVICAL SPINE WITHOUT CONTRAST  TECHNIQUE: Multidetector CT imaging of the head and cervical spine was performed following the standard protocol without intravenous contrast. Multiplanar CT image reconstructions of the cervical spine were also generated.  COMPARISON:  CT scan of April 15, 2013. MRI scan of May 01, 2013.  FINDINGS: CT HEAD FINDINGS  Status post right parietal craniotomy. Mild diffuse cortical atrophy is noted. Chronic ischemic white matter disease is noted.  Ventricular size is unchanged compared to prior exam and consistent with degree of atrophy present. 2.3 x 1.4 cm calcified mass is again noted superior to the right clinoid process which was demonstrated to represent meningioma on prior MRI exam. There is no evidence of acute infarction or hemorrhage seen currently.  CT CERVICAL SPINE FINDINGS  No fracture or spondylolisthesis is noted. Mild degenerative disc disease is noted at C3-4 and C4-5. Minimal hypertrophy of posterior facet joints is seen at multiple levels.  IMPRESSION: Status post right parietal craniotomy. Mild diffuse cortical atrophy and chronic ischemic white matter disease. No change seen involving 2.3 cm calcified mass arising from right clinoid process consistent with meningioma as described on prior MRI exam. No acute intracranial abnormality is noted.  Degenerative changes are noted in the cervical spine. No acute abnormality seen.   Electronically Signed   By: Roque Lias M.D.   On: 05/13/2013 22:03   Ct Cervical Spine Wo Contrast  05/13/2013   CLINICAL DATA:  Fall, seizure.  EXAM: CT HEAD WITHOUT CONTRAST  CT CERVICAL SPINE WITHOUT CONTRAST  TECHNIQUE: Multidetector CT imaging of the head and cervical spine was performed following the standard protocol without intravenous contrast. Multiplanar CT image reconstructions of the cervical spine were also generated.  COMPARISON:  CT scan of April 15, 2013. MRI scan of May 01, 2013.  FINDINGS: CT HEAD FINDINGS  Status post right parietal craniotomy. Mild diffuse cortical atrophy is noted. Chronic ischemic white matter disease is noted. Ventricular size is unchanged compared to prior exam and consistent with degree of atrophy present. 2.3 x 1.4 cm calcified mass is again noted superior to the right clinoid process which was demonstrated to represent meningioma on prior MRI exam. There is no evidence of acute infarction or hemorrhage seen currently.  CT CERVICAL SPINE FINDINGS  No fracture or  spondylolisthesis is noted. Mild degenerative disc disease is noted at C3-4 and C4-5. Minimal hypertrophy of posterior facet joints is seen at multiple levels.  IMPRESSION: Status post right parietal craniotomy. Mild diffuse cortical atrophy and chronic ischemic white matter disease. No change seen involving 2.3 cm calcified mass arising from right clinoid process consistent with meningioma as described on prior MRI exam. No acute intracranial abnormality is noted.  Degenerative changes are noted in the cervical spine. No acute abnormality seen.   Electronically Signed   By: Roque Lias M.D.   On: 05/13/2013 22:03   Mr Maxine Glenn Head Wo Contrast  05/01/2013   CLINICAL DATA:  Seizures.  Abnormal head CT.  EXAM: MRI HEAD WITHOUT AND WITH CONTRAST  MRA HEAD WITHOUT CONTRAST  TECHNIQUE: Multiplanar, multiecho pulse sequences of the brain and surrounding structures were obtained without and with intravenous contrast. Angiographic images of the head were obtained using MRA technique without contrast.  CONTRAST:  18mL MULTIHANCE GADOBENATE DIMEGLUMINE 529  MG/ML IV SOLN  COMPARISON:  Multiple head CT exams from 09/18/2012 through 04/15/2013  FINDINGS: MRI HEAD FINDINGS  The patient has had previous right-sided craniotomy for treatment of subdural hematomas.  No evidence of acute or subacute infarction. There are mild chronic small-vessel changes affecting the pons. The cerebral hemispheres show moderate chronic small vessel changes throughout the deep and subcortical white matter. There is a small right temporal cortical infarction which appears old. There is no residual or recurrent subdural collection. Ventricles are prominent consistent with generalized atrophy.  There is a 2.5 x 1.7 x 2.1 cm extra-axial lesion projecting upward from the clinoid process region on the right. This contains areas of calcification and shows low level enhancement. This is consistent with a meningioma. I do not believe this represents an  aneurysm or intra-axial tumor.  MRA HEAD FINDINGS  There is no flow in the right internal carotid artery. The left internal carotid artery is widely patent through the skullbase. The left internal carotid artery supplies the left middle cerebral artery territory and, through a patent anterior communicating artery, supplies the anterior circulation on the right. There is complex vascular anatomy in the region of the anterior communicating artery. I think there is probably a fenestration. The findings are less likely to represent an aneurysm. If there were an aneurysm here, it would be no larger than 3-4 mm in size.  Both vertebral arteries are widely patent to the basilar. No basilar stenosis. Posterior circulation branch vessels are patent and appear normal.  In correlation with the previous CT examinations, note is made that there is a focal hyperdense entity in the pre cavernous carotid on the right that has characteristics of a foreign object. This could be accidental or some sort of intentional medical device placed for occlusion.  IMPRESSION: No acute finding. No evidence of residual or recurrent subdural collection.  Generalized brain atrophy and extensive chronic small vessel changes throughout the brain. Chronic right internal carotid artery occlusion. 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  2.5 x 1.7 x 2.1 cm extra axial lesion projecting upward from the clinoid process on the right, indenting the inferior frontal lobe. Mild adjacent gliosis. This lesion is felt to represent a chronic meningioma.  Right internal carotid artery occlusion. Hyperdense entity in the pre clinoid ICA on the CT examinations looks like a foreign object. Flow through a patent anterior communicating artery allows supply to the right hemisphere. I think there is probably a fenestrated anterior communicating artery. Difficult to completely exclude a small anterior communicating artery aneurysm, but I do not favor that. If  present, a would be no larger than 4 mm.   Electronically Signed   By: Paulina Fusi M.D.   On: 05/01/2013 14:55   Mr Laqueta Jean ZO Contrast  05/01/2013   CLINICAL DATA:  Seizures.  Abnormal head CT.  EXAM: MRI HEAD WITHOUT AND WITH CONTRAST  MRA HEAD WITHOUT CONTRAST  TECHNIQUE: Multiplanar, multiecho pulse sequences of the brain and surrounding structures were obtained without and with intravenous contrast. Angiographic images of the head were obtained using MRA technique without contrast.  CONTRAST:  18mL MULTIHANCE GADOBENATE DIMEGLUMINE 529 MG/ML IV SOLN  COMPARISON:  Multiple head CT exams from 09/18/2012 through 04/15/2013  FINDINGS: MRI HEAD FINDINGS  The patient has had previous right-sided craniotomy for treatment of subdural hematomas.  No evidence of acute or subacute infarction. There are mild chronic small-vessel changes affecting the pons. The cerebral hemispheres show moderate chronic small vessel changes  throughout the deep and subcortical white matter. There is a small right temporal cortical infarction which appears old. There is no residual or recurrent subdural collection. Ventricles are prominent consistent with generalized atrophy.  There is a 2.5 x 1.7 x 2.1 cm extra-axial lesion projecting upward from the clinoid process region on the right. This contains areas of calcification and shows low level enhancement. This is consistent with a meningioma. I do not believe this represents an aneurysm or intra-axial tumor.  MRA HEAD FINDINGS  There is no flow in the right internal carotid artery. The left internal carotid artery is widely patent through the skullbase. The left internal carotid artery supplies the left middle cerebral artery territory and, through a patent anterior communicating artery, supplies the anterior circulation on the right. There is complex vascular anatomy in the region of the anterior communicating artery. I think there is probably a fenestration. The findings are less  likely to represent an aneurysm. If there were an aneurysm here, it would be no larger than 3-4 mm in size.  Both vertebral arteries are widely patent to the basilar. No basilar stenosis. Posterior circulation branch vessels are patent and appear normal.  In correlation with the previous CT examinations, note is made that there is a focal hyperdense entity in the pre cavernous carotid on the right that has characteristics of a foreign object. This could be accidental or some sort of intentional medical device placed for occlusion.  IMPRESSION: No acute finding. No evidence of residual or recurrent subdural collection.  Generalized brain atrophy and extensive chronic small vessel changes throughout the brain. Chronic right internal carotid artery occlusion. 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  2.5 x 1.7 x 2.1 cm extra axial lesion projecting upward from the clinoid process on the right, indenting the inferior frontal lobe. Mild adjacent gliosis. This lesion is felt to represent a chronic meningioma.  Right internal carotid artery occlusion. Hyperdense entity in the pre clinoid ICA on the CT examinations looks like a foreign object. Flow through a patent anterior communicating artery allows supply to the right hemisphere. I think there is probably a fenestrated anterior communicating artery. Difficult to completely exclude a small anterior communicating artery aneurysm, but I do not favor that. If present, a would be no larger than 4 mm.   Electronically Signed   By: Paulina Fusi M.D.   On: 05/01/2013 14:55    Microbiology: No results found for this or any previous visit (from the past 240 hour(s)).   Labs: Basic Metabolic Panel:  Recent Labs Lab 05/14/13 1219 05/14/13 2156 05/15/13 0805 05/17/13 2122 05/18/13 0450  NA 134* 135 134* 134* 136  K 4.2 4.0 3.5 3.6 3.6  CL 100 101 102 101 102  CO2 23 24 22 23 22   GLUCOSE 142* 127* 148* 132* 135*  BUN 10 14 11 13 13   CREATININE 0.74 0.80  0.66 0.87 0.76  CALCIUM 8.7 9.0 8.8 8.9 8.8  MG  --   --   --   --  1.8   Liver Function Tests:  Recent Labs Lab 05/13/13 2115 05/17/13 2122 05/18/13 0450  AST 14 10 12   ALT 6 10 10   ALKPHOS 75 79 80  BILITOT 0.3 0.3 0.3  PROT 6.8 6.3 6.2  ALBUMIN 3.4* 3.0* 3.0*   No results found for this basename: LIPASE, AMYLASE,  in the last 168 hours No results found for this basename: AMMONIA,  in the last 168 hours CBC:  Recent Labs  Lab 05/13/13 2115 05/17/13 2122 05/18/13 0450  WBC 10.8* 8.4 9.7  NEUTROABS 7.9* 5.0 5.9  HGB 13.4 11.5* 11.7*  HCT 38.7* 34.1* 34.2*  MCV 81.5 81.8 81.6  PLT 197 242 244   Cardiac Enzymes: No results found for this basename: CKTOTAL, CKMB, CKMBINDEX, TROPONINI,  in the last 168 hours BNP: BNP (last 3 results) No results found for this basename: PROBNP,  in the last 8760 hours CBG:  Recent Labs Lab 05/17/13 1223 05/17/13 1636 05/17/13 2040 05/18/13 0741 05/18/13 1140  GLUCAP 96 124* 142* 163* 115*       Signed:  Penny Pia  [facility] 05/18/2013, 1:01 PM

## 2013-05-18 NOTE — Progress Notes (Signed)
Discharge instructions accompanied pt, left the unit in stable condition, transported via ambulance to SNF. 

## 2013-05-18 NOTE — Care Management Note (Signed)
    Page 1 of 2   05/18/2013     4:48:49 PM   CARE MANAGEMENT NOTE 05/18/2013  Patient:  CORTEZ, FLIPPEN   Account Number:  [id number]  Date Initiated:  05/14/2013  Documentation initiated by:  DAVIS,RHONDA  Subjective/Objective Assessment:   pt with hx of seizures now having several witnessed and requiring iv keppra     Action/Plan:   home when stable   Anticipated DC Date:  05/17/2013   Anticipated DC Plan:  HOME/SELF CARE  In-house referral  Clinical Social Worker      DC Planning Services  NA      [facility] Choice  NA   Choice offered to / List presented to:  NA   DME arranged  NA      DME agency  NA     HH arranged  NA  NA      HH agency  NA   Status of service:  Completed, signed off Medicare Important Message given?  NA - LOS <3 / Initial given by admissions (If response is "NO", the following Medicare IM given date fields will be blank) Date Medicare IM given:   Date Additional Medicare IM given:    Discharge Disposition:  SKILLED NURSING FACILITY  Per UR Regulation:  Reviewed for med. necessity/level of care/duration of stay  If discussed at Long Length of Stay Meetings, dates discussed:    Comments:  05/18/2013 Colleen Can BSN RN CCM [phone] Pt discharged to SNF today.  [id number] Earlene Plater, RN,BSN,CCM: Case management [phone] Chart reviewed and updated.  Next chart review due on [id number]. Needs for discharge at time of review:  none

## 2013-05-18 NOTE — Clinical Social Work Psychosocial (Signed)
     Clinical Social Work Department BRIEF PSYCHOSOCIAL ASSESSMENT 05/18/2013  Patient:  Gerald Leach, Gerald Leach     Account Number:  [id number]     Admit date:  05/13/2013  Clinical Social Worker:  Hattie Perch  Date/Time:  05/18/2013 12:00 M  Referred by:  Physician  Date Referred:  05/18/2013 Referred for  SNF Placement   Other Referral:   Interview type:  Patient Other interview type:   son at bedside    PSYCHOSOCIAL DATA Living Status:  FAMILY Admitted from facility:   Level of care:   Primary support name:  Jacques Earthly Primary support relationship to patient:  CHILD, ADULT Degree of support available:   good    CURRENT CONCERNS Current Concerns  Post-Acute Placement   Other Concerns:    SOCIAL WORK ASSESSMENT / PLAN CSW met with patient and patient son at bedside. patient is alert and oriented X3. patient states that his wife is currently in rehab at [facility]. Son and patient confirm that is where patient would like to go to rehab.   Assessment/plan status:   Other assessment/ plan:   Information/referral to community resources:    PATIENTS/FAMILYS RESPONSE TO PLAN OF CARE: patient wishes he could go home. he is hopeful his wife will be home from rehab in about a week and is upset that he is not able to go home with her. however, he is realistic and realizes that rehab is necessary right now.

## 2013-05-18 NOTE — Clinical Social Work Placement (Signed)
     Clinical Social Work Department CLINICAL SOCIAL WORK PLACEMENT NOTE 05/18/2013  Patient:  Gerald Leach, Gerald Leach  Account Number:  [id number] Admit date:  05/13/2013  Clinical Social Worker:  Becky Sax, LCSW  Date/time:  05/18/2013 12:00 M  Clinical Social Work is seeking post-discharge placement for this patient at the following level of care:   SKILLED NURSING   (*CSW will update this form in Epic as items are completed)   05/18/2013  Patient/family provided with [facility] Department of Clinical Social Works list of facilities offering this level of care within the geographic area requested by the patient (or if unable, by the patients family).  05/18/2013  Patient/family informed of their freedom to choose among providers that offer the needed level of care, that participate in Medicare, Medicaid or managed care program needed by the patient, have an available bed and are willing to accept the patient.  05/18/2013  Patient/family informed of [facility] ownership interest in [facility], as well as of the fact that they are under no obligation to receive care at this facility.  PASARR submitted to EDS on 05/18/2013 PASARR number received from EDS on 05/18/2013  FL2 transmitted to all facilities in geographic area requested by pt/family on  05/18/2013 FL2 transmitted to all facilities within larger geographic area on   Patient informed that his/her managed care company has contracts with or will negotiate with  certain facilities, including the following:     Patient/family informed of bed offers received:  05/18/2013 Patient chooses bed at [facility] AND [facility] Physician recommends and patient chooses bed at    Patient to be transferred to [facility] on  05/18/2013 Patient to be transferred to facility by ptar  The following physician request were entered in Epic:   Additional Comments:

## 2013-05-18 NOTE — Progress Notes (Addendum)
Patient cleared for discharge. Packet copied and placed in [place]. Patient has bed available at [facility] for today. Son to complete paperwork at [facility] at 230pm. ptar called for transportation.  Marrianne Sica C. [name] Wojtkiewicz MSW, LCSW [phone]

## 2013-06-30 ENCOUNTER — Telehealth: Payer: Self-pay | Admitting: *Deleted

## 2013-06-30 NOTE — Telephone Encounter (Signed)
Spoke with patient son Gerald Leach is no longer in the assistant living he is home again. Recommended he schedule a follow up appointment

## 2013-07-01 NOTE — Telephone Encounter (Signed)
Noted.  Thank you.  Donika K. Posey Pronto, DO

## 2013-07-08 ENCOUNTER — Encounter (HOSPITAL_COMMUNITY): Payer: Self-pay | Admitting: Emergency Medicine

## 2013-07-08 ENCOUNTER — Emergency Department (HOSPITAL_COMMUNITY)
Admission: EM | Admit: 2013-07-08 | Discharge: 2013-07-08 | Disposition: A | Discharge status: Home / Self Care | Payer: Medicare Other | Attending: Emergency Medicine | Admitting: Emergency Medicine

## 2013-07-08 ENCOUNTER — Emergency Department (HOSPITAL_COMMUNITY): Payer: Medicare Other

## 2013-07-08 DIAGNOSIS — E039 Hypothyroidism, unspecified: Secondary | ICD-10-CM | POA: Insufficient documentation

## 2013-07-08 DIAGNOSIS — R569 Unspecified convulsions: Secondary | ICD-10-CM

## 2013-07-08 DIAGNOSIS — K219 Gastro-esophageal reflux disease without esophagitis: Secondary | ICD-10-CM | POA: Insufficient documentation

## 2013-07-08 DIAGNOSIS — I10 Essential (primary) hypertension: Secondary | ICD-10-CM | POA: Insufficient documentation

## 2013-07-08 DIAGNOSIS — Z860100 Personal history of colon polyps, unspecified: Secondary | ICD-10-CM | POA: Insufficient documentation

## 2013-07-08 DIAGNOSIS — G40909 Epilepsy, unspecified, not intractable, without status epilepticus: Secondary | ICD-10-CM

## 2013-07-08 DIAGNOSIS — E785 Hyperlipidemia, unspecified: Secondary | ICD-10-CM | POA: Insufficient documentation

## 2013-07-08 DIAGNOSIS — Z87448 Personal history of other diseases of urinary system: Secondary | ICD-10-CM | POA: Insufficient documentation

## 2013-07-08 DIAGNOSIS — Z79899 Other long term (current) drug therapy: Secondary | ICD-10-CM | POA: Insufficient documentation

## 2013-07-08 LAB — URINALYSIS, ROUTINE W REFLEX MICROSCOPIC
Bilirubin Urine: NEGATIVE
Glucose, UA: NEGATIVE mg/dL
Hgb urine dipstick: NEGATIVE
KETONES UR: NEGATIVE mg/dL
Leukocytes, UA: NEGATIVE
NITRITE: NEGATIVE
PROTEIN: NEGATIVE mg/dL
Specific Gravity, Urine: 1.012 (ref 1.005–1.030)
Urobilinogen, UA: 0.2 mg/dL (ref 0.0–1.0)
pH: 8 (ref 5.0–8.0)

## 2013-07-08 LAB — BASIC METABOLIC PANEL
BUN: 14 mg/dL (ref 6–23)
CO2: 26 meq/L (ref 19–32)
CREATININE: 0.76 mg/dL (ref 0.50–1.35)
Calcium: 9.1 mg/dL (ref 8.4–10.5)
Chloride: 98 mEq/L (ref 96–112)
GFR calc Af Amer: >90 mL/min (ref >90)
GFR calc non Af Amer: 86 mL/min — ABNORMAL LOW (ref >90)
Glucose, Bld: 119 mg/dL — ABNORMAL HIGH (ref 70–99)
POTASSIUM: 4.1 meq/L (ref 3.7–5.3)
Sodium: 136 mEq/L — ABNORMAL LOW (ref 137–147)

## 2013-07-08 LAB — GLUCOSE, CAPILLARY: Glucose-Capillary: 113 mg/dL — ABNORMAL HIGH (ref 70–99)

## 2013-07-08 LAB — CBC
HCT: 38.5 % — ABNORMAL LOW (ref 39.0–52.0)
HEMOGLOBIN: 12.8 g/dL — AB (ref 13.0–17.0)
MCH: 26.8 pg (ref 26.0–34.0)
MCHC: 33.2 g/dL (ref 30.0–36.0)
MCV: 80.7 fL (ref 78.0–100.0)
Platelets: 249 10*3/uL (ref 150–400)
RBC: 4.77 MIL/uL (ref 4.22–5.81)
RDW: 13 % (ref 11.5–15.5)
WBC: 12.1 10*3/uL — AB (ref 4.0–10.5)

## 2013-07-08 MED ORDER — LEVETIRACETAM 500 MG/5ML IV SOLN: 500.0000 mg | Freq: Once | INTRAVENOUS | Status: DC

## 2013-07-08 MED ORDER — SODIUM CHLORIDE 0.9 % IV SOLN
1000.0000 mg | Freq: Once | INTRAVENOUS | Status: AC
  Administered 2013-07-08: 1000 mg via INTRAVENOUS
  Filled 2013-07-08: qty 10

## 2013-07-08 NOTE — ED Provider Notes (Signed)
Medical screening examination/treatment/procedure(s) were conducted as a shared visit with non-physician practitioner(s) and myself.  I personally evaluated the patient during the encounter.  EKG Interpretation   None       Well appearing. Will increase keppra. keppra load in ER  Hoy Morn, MD 07/08/13 [phone]

## 2013-07-08 NOTE — ED Notes (Signed)
Brought in by EMS from home with c/o seizures.  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   Pt presents to ED alert and responsive.

## 2013-07-08 NOTE — ED Notes (Signed)
Pt. At CT scan. 

## 2013-07-08 NOTE — Discharge Instructions (Signed)
Seizure, Adult A seizure is abnormal electrical activity in the brain. Seizures usually last from 30 seconds to 2 minutes. There are various types of seizures. Before a seizure, you may have a warning sensation (aura) that a seizure is about to occur. An aura may include the following symptoms:   Fear or anxiety.  Nausea.  Feeling like the room is spinning (vertigo).  Vision changes, such as seeing flashing lights or spots. Common symptoms during a seizure include:  A change in attention or behavior (altered mental status).  Convulsions with rhythmic jerking movements.  Drooling.  Rapid eye movements.  Grunting.  Loss of bladder and bowel control.  Bitter taste in the mouth.  Tongue biting. After a seizure, you may feel confused and sleepy. You may also have an injury resulting from convulsions during the seizure. HOME CARE INSTRUCTIONS   If you are given medicines, take them exactly as prescribed by your health care provider.  Keep all follow-up appointments as directed by your health care provider.  Do not swim or drive or engage in risky activity during which a seizure could cause further injury to you or others until your health care provider says it is OK.  Get adequate rest.  Teach friends and family what to do if you have a seizure. They should:  Lay you on the ground to prevent a fall.  Put a cushion under your head.  Loosen any tight clothing around your neck.  Turn you on your side. If vomiting occurs, this helps keep your airway clear.  Stay with you until you recover.  Know whether or not you need emergency care. SEEK IMMEDIATE MEDICAL CARE IF:  The seizure lasts longer than 5 minutes.  The seizure is severe or you do not wake up immediately after the seizure.  You have an altered mental status after the seizure.  You are having more frequent or worsening seizures. Someone should drive you to the emergency department or call local emergency  services (911 in [place] MAKE SURE YOU:  Understand these instructions.  Will watch your condition.  Will get help right away if you are not doing well or get worse. Document Released: 06/01/2000 Document Revised: 03/25/2013 Document Reviewed: 01/14/2013 [facility] Patient Information 2014 [place]. Given additional In the emergency department to help control your seizures, your lab work, urine, and head CT are all within normal limits.  Please follow up with your primary care physician. Please make sure to take your Keppra regular basis Please contact your neurologist for further instructions concerning better seizure control

## 2013-07-08 NOTE — ED Provider Notes (Signed)
CSN: [id number]     Arrival date & time 07/08/13  0017 History   First MD Initiated Contact with Patient 07/08/13 0101     Chief Complaint  Patient presents with  . Seizures   (Consider location/radiation/quality/duration/timing/severity/associated sxs/prior Treatment) HPI Comments: Patient was transported from home by EMS after family called reporting that the patient.  Has been having intermittent seizure activity.  Since 6 p.m., yesterday.  The report 5, weakness, seizures, shaking, to the upper extremities, and a blank face.  He does take Keppra for seizure control.  He does have a history of aneurysm, CVA with left-sided weakness. On arrival to the emergency department, patient is unable to articulate any significant history intercourse, and appropriately, and don't know if this is his baseline, or a result of the seizures.  Patient is a 80 y.o. male presenting with seizures. The history is provided by the EMS personnel.  Seizures Seizure activity on arrival: no   Seizure type:  Unable to specify Initial focality:  Unable to specify Severity:  Unable to specify   Past Medical History  Diagnosis Date  . Hypertension   . Colonic polyp 2003  . GERD (gastroesophageal reflux disease)   . ED (erectile dysfunction)   . Hyperlipidemia   . Hypothyroidism   . Brain aneurysm   . Seizures     per family   Past Surgical History  Procedure Laterality Date  . Brain aneurysm surgery      at [facility]  . Carotid artery aneurysm    . Colonoscopy      polyps  . Right hernia    . Craniotomy Right 09/19/2012    Procedure: CRANIOTOMY HEMATOMA EVACUATION SUBDURAL;  Surgeon: Otilio Connors, MD;  Location: [facility];  Service: Neurosurgery;  Laterality: Right;  . Craniotomy Right 09/22/2012    Procedure: CRANIOTOMY HEMATOMA EVACUATION SUBDURAL;  Surgeon: Otilio Connors, MD;  Location: [facility];  Service: Neurosurgery;  Laterality: Right;   Family History  Problem Relation Age of Onset  .  Liver disease Brother     Died   History  Substance Use Topics  . Smoking status: Never Smoker   . Smokeless tobacco: Never Used  . Alcohol Use: Yes     Comment: rum mixed in diet coke 3 a week    Review of Systems  Unable to perform ROS: Other  Constitutional: Negative for fever.  Neurological: Positive for seizures.  All other systems reviewed and are negative.    Allergies  Review of patient's allergies indicates no known allergies.  Home Medications   Current Outpatient Rx  Name  Route  Sig  Dispense  Refill  . levETIRAcetam (KEPPRA) 750 MG tablet   Oral   Take 1,500 mg by mouth 2 (two) times daily.         Marland Kitchen levothyroxine (SYNTHROID, LEVOTHROID) 50 MCG tablet   Oral   Take 50 mcg by mouth daily before breakfast.         . metFORMIN (GLUCOPHAGE) 500 MG tablet   Oral   Take 500 mg by mouth 2 (two) times daily with a meal.         . Multiple Vitamin (MULTIVITAMIN WITH MINERALS) TABS tablet   Oral   Take 1 tablet by mouth daily.         Marland Kitchen omeprazole (PRILOSEC) 20 MG capsule   Oral   Take 20 mg by mouth every Monday, Wednesday, and Friday.          Marland Kitchen  simvastatin (ZOCOR) 10 MG tablet   Oral   Take 10 mg by mouth at bedtime.         . ONE TOUCH LANCETS MISC   Does not apply   1 each by Does not apply route daily.   200 each   0     DM II - 250.00    BP 125/82  Pulse 78  Temp(Src) 98.5 F (36.9 C) (Oral)  Resp 20  SpO2 98% Physical Exam  Nursing note and vitals reviewed. Constitutional: He is oriented to person, place, and time. He appears well-developed and well-nourished.  HENT:  Head: Normocephalic.  Right Ear: External ear normal.  Left Ear: External ear normal.  Cardiovascular: Normal rate.   Pulmonary/Chest: Effort normal and breath sounds normal.  Abdominal: Soft.  Musculoskeletal: Normal range of motion.  Neurological: He is alert and oriented to person, place, and time.  Skin: Skin is warm and dry.  Psychiatric: His  behavior is normal.    ED Course  Procedures (including critical care time) Labs Review Labs Reviewed  BASIC METABOLIC PANEL - Abnormal; Notable for the following:    Sodium 136 (*)    Glucose, Bld 119 (*)    GFR calc non Af Amer 86 (*)    All other components within normal limits  CBC - Abnormal; Notable for the following:    WBC 12.1 (*)    Hemoglobin 12.8 (*)    HCT 38.5 (*)    All other components within normal limits  GLUCOSE, CAPILLARY - Abnormal; Notable for the following:    Glucose-Capillary 113 (*)    All other components within normal limits  URINALYSIS, ROUTINE W REFLEX MICROSCOPIC - Abnormal; Notable for the following:    APPearance CLOUDY (*)    All other components within normal limits   Imaging Review Ct Head Wo Contrast  07/08/2013   CLINICAL DATA:  Head trauma, history of stroke and aneurysm.  EXAM: CT HEAD WITHOUT CONTRAST  TECHNIQUE: Contiguous axial images were obtained from the base of the skull through the vertex without intravenous contrast.  COMPARISON:  CT of the head May 13, 2013  FINDINGS: No intraparenchymal hemorrhage, mass effect or midline shift. Right frontoparietal encephalomalacia again noted, with right craniotomy. Peripherally calcified 22 x 17 mm mass at right anterior clinoid. No intraparenchymal hemorrhage, midline shift or acute large vascular territory infarcts. Patchy to confluent supratentorial white matter hypodensities again noted.  No abnormal extra-axial fluid collections. Basal cisterns are patent. Paranasal sinus mucosal thickening without air-fluid levels. Tubular density within the carotid siphon of the right may reflect stent or calcification, unchanged.  IMPRESSION: No acute intracranial process.  Stable 22 x 17 mm right anterior clinoid meningioma versus calcified aneurysm given patient's history.  Right frontotemporal encephalomalacia in a background of moderate white matter changes suggesting chronic small vessel ischemic  disease.   Electronically Signed   By: Elon Alas   On: 07/08/2013 02:10    EKG Interpretation   None       MDM   1. Seizure   2. Seizure disorder     Patient was given, 1000 mg Are allergic in the emergency department, IV.  He is been observed through the night.  He said no further seizure activity.  His lab work, urine, and head CT are all within normal limits.  I recommend that he followup with his neurologist to discuss that her seizure control    Garald Balding, NP 07/08/13 [place]  Olean Ree, NP 07/08/13 [phone]

## 2013-07-08 NOTE — ED Notes (Signed)
Pt. CBG 113. RN, Cheral Bay made aware.

## 2013-07-08 NOTE — ED Notes (Signed)
Bed: GE36 Expected date: 07/08/13 Expected time: 12:13 AM Means of arrival: Ambulance Comments: seizures

## 2013-07-08 NOTE — ED Notes (Signed)
Pt's daughter, Shirlean Mylar, tel# [phone],  [phone]. Pleas call above person when pt is discharged.

## 2013-07-19 ENCOUNTER — Other Ambulatory Visit: Payer: Self-pay | Admitting: Family Medicine

## 2013-07-26 ENCOUNTER — Other Ambulatory Visit: Payer: Self-pay | Admitting: Family Medicine

## 2013-09-06 ENCOUNTER — Other Ambulatory Visit: Payer: Self-pay | Admitting: Family Medicine

## 2013-10-11 ENCOUNTER — Other Ambulatory Visit: Payer: Self-pay | Admitting: Family Medicine

## 2013-10-12 ENCOUNTER — Other Ambulatory Visit: Payer: Self-pay | Admitting: Family Medicine

## 2013-10-13 ENCOUNTER — Other Ambulatory Visit: Payer: Self-pay | Admitting: Family Medicine

## 2013-11-09 ENCOUNTER — Other Ambulatory Visit: Payer: Self-pay | Admitting: Family Medicine

## 2013-11-15 ENCOUNTER — Other Ambulatory Visit: Payer: Self-pay | Admitting: Family Medicine

## 2014-01-11 ENCOUNTER — Other Ambulatory Visit: Payer: Self-pay | Admitting: Family Medicine

## 2014-02-05 ENCOUNTER — Emergency Department (HOSPITAL_COMMUNITY): Payer: Medicare Other

## 2014-02-05 ENCOUNTER — Observation Stay (HOSPITAL_COMMUNITY)
Admission: EM | Admit: 2014-02-05 | Discharge: 2014-02-09 | Disposition: A | Discharge status: Home / Self Care | Payer: Medicare Other | Attending: Internal Medicine | Admitting: Internal Medicine

## 2014-02-05 ENCOUNTER — Encounter (HOSPITAL_COMMUNITY): Payer: Self-pay | Admitting: Emergency Medicine

## 2014-02-05 DIAGNOSIS — F0390 Unspecified dementia without behavioral disturbance: Secondary | ICD-10-CM | POA: Insufficient documentation

## 2014-02-05 DIAGNOSIS — Y92009 Unspecified place in unspecified non-institutional (private) residence as the place of occurrence of the external cause: Secondary | ICD-10-CM | POA: Insufficient documentation

## 2014-02-05 DIAGNOSIS — S0100XA Unspecified open wound of scalp, initial encounter: Secondary | ICD-10-CM | POA: Diagnosis not present

## 2014-02-05 DIAGNOSIS — R55 Syncope and collapse: Secondary | ICD-10-CM | POA: Diagnosis present

## 2014-02-05 DIAGNOSIS — I10 Essential (primary) hypertension: Secondary | ICD-10-CM

## 2014-02-05 DIAGNOSIS — G40909 Epilepsy, unspecified, not intractable, without status epilepticus: Secondary | ICD-10-CM

## 2014-02-05 DIAGNOSIS — IMO0001 Reserved for inherently not codable concepts without codable children: Secondary | ICD-10-CM | POA: Diagnosis not present

## 2014-02-05 DIAGNOSIS — E785 Hyperlipidemia, unspecified: Secondary | ICD-10-CM | POA: Diagnosis not present

## 2014-02-05 DIAGNOSIS — K219 Gastro-esophageal reflux disease without esophagitis: Secondary | ICD-10-CM | POA: Insufficient documentation

## 2014-02-05 DIAGNOSIS — L570 Actinic keratosis: Secondary | ICD-10-CM

## 2014-02-05 DIAGNOSIS — G40409 Other generalized epilepsy and epileptic syndromes, not intractable, without status epilepticus: Secondary | ICD-10-CM

## 2014-02-05 DIAGNOSIS — E039 Hypothyroidism, unspecified: Secondary | ICD-10-CM

## 2014-02-05 DIAGNOSIS — S065XAA Traumatic subdural hemorrhage with loss of consciousness status unknown, initial encounter: Secondary | ICD-10-CM

## 2014-02-05 DIAGNOSIS — Z79899 Other long term (current) drug therapy: Secondary | ICD-10-CM | POA: Insufficient documentation

## 2014-02-05 DIAGNOSIS — R29810 Facial weakness: Secondary | ICD-10-CM | POA: Insufficient documentation

## 2014-02-05 DIAGNOSIS — R569 Unspecified convulsions: Secondary | ICD-10-CM

## 2014-02-05 DIAGNOSIS — F19921 Other psychoactive substance use, unspecified with intoxication with delirium: Secondary | ICD-10-CM

## 2014-02-05 DIAGNOSIS — W19XXXA Unspecified fall, initial encounter: Secondary | ICD-10-CM | POA: Diagnosis not present

## 2014-02-05 DIAGNOSIS — R1310 Dysphagia, unspecified: Secondary | ICD-10-CM

## 2014-02-05 DIAGNOSIS — S065X9A Traumatic subdural hemorrhage with loss of consciousness of unspecified duration, initial encounter: Secondary | ICD-10-CM

## 2014-02-05 DIAGNOSIS — IMO0002 Reserved for concepts with insufficient information to code with codable children: Secondary | ICD-10-CM

## 2014-02-05 DIAGNOSIS — D126 Benign neoplasm of colon, unspecified: Secondary | ICD-10-CM

## 2014-02-05 DIAGNOSIS — R29898 Other symptoms and signs involving the musculoskeletal system: Secondary | ICD-10-CM | POA: Insufficient documentation

## 2014-02-05 DIAGNOSIS — Z9889 Other specified postprocedural states: Secondary | ICD-10-CM

## 2014-02-05 DIAGNOSIS — C44300 Unspecified malignant neoplasm of skin of unspecified part of face: Secondary | ICD-10-CM

## 2014-02-05 DIAGNOSIS — E1165 Type 2 diabetes mellitus with hyperglycemia: Secondary | ICD-10-CM

## 2014-02-05 HISTORY — DX: Syncope and collapse: R55

## 2014-02-05 LAB — CBC WITH DIFFERENTIAL/PLATELET
Basophils Absolute: 0 10*3/uL (ref 0.0–0.1)
Basophils Relative: 0 % (ref 0–1)
Eosinophils Absolute: 0.1 10*3/uL (ref 0.0–0.7)
Eosinophils Relative: 1 % (ref 0–5)
HCT: 39.1 % (ref 39.0–52.0)
HEMOGLOBIN: 12.8 g/dL — AB (ref 13.0–17.0)
Lymphocytes Relative: 17 % (ref 12–46)
Lymphs Abs: 2.1 10*3/uL (ref 0.7–4.0)
MCH: 26.4 pg (ref 26.0–34.0)
MCHC: 32.7 g/dL (ref 30.0–36.0)
MCV: 80.8 fL (ref 78.0–100.0)
MONOS PCT: 8 % (ref 3–12)
Monocytes Absolute: 0.9 10*3/uL (ref 0.1–1.0)
NEUTROS PCT: 74 % (ref 43–77)
Neutro Abs: 9.1 10*3/uL — ABNORMAL HIGH (ref 1.7–7.7)
Platelets: 208 10*3/uL (ref 150–400)
RBC: 4.84 MIL/uL (ref 4.22–5.81)
RDW: 15 % (ref 11.5–15.5)
WBC: 12.2 10*3/uL — ABNORMAL HIGH (ref 4.0–10.5)

## 2014-02-05 LAB — BASIC METABOLIC PANEL
Anion gap: 13 (ref 5–15)
BUN: 11 mg/dL (ref 6–23)
CALCIUM: 8.6 mg/dL (ref 8.4–10.5)
CO2: 24 mEq/L (ref 19–32)
CREATININE: 0.79 mg/dL (ref 0.50–1.35)
Chloride: 100 mEq/L (ref 96–112)
GFR calc non Af Amer: 84 mL/min — ABNORMAL LOW (ref >90)
Glucose, Bld: 112 mg/dL — ABNORMAL HIGH (ref 70–99)
Potassium: 4 mEq/L (ref 3.7–5.3)
Sodium: 137 mEq/L (ref 137–147)

## 2014-02-05 LAB — I-STAT TROPONIN, ED: Troponin i, poc: 0 ng/mL (ref 0.00–0.08)

## 2014-02-05 MED ORDER — LEVETIRACETAM 500 MG PO TABS
1000.0000 mg | ORAL_TABLET | Freq: Two times a day (BID) | ORAL | Status: DC
  Administered 2014-02-06 – 2014-02-09 (×7): 1000 mg via ORAL
  Filled 2014-02-05 (×9): qty 2

## 2014-02-05 MED ORDER — SIMVASTATIN 10 MG PO TABS
10.0000 mg | ORAL_TABLET | Freq: Every day | ORAL | Status: DC
  Administered 2014-02-06 – 2014-02-08 (×3): 10 mg via ORAL
  Filled 2014-02-05 (×5): qty 1

## 2014-02-05 MED ORDER — ADULT MULTIVITAMIN W/MINERALS CH
1.0000 | ORAL_TABLET | Freq: Every day | ORAL | Status: DC
  Administered 2014-02-06 – 2014-02-09 (×4): 1 via ORAL
  Filled 2014-02-05 (×4): qty 1

## 2014-02-05 MED ORDER — METFORMIN HCL 500 MG PO TABS
500.0000 mg | ORAL_TABLET | Freq: Two times a day (BID) | ORAL | Status: DC
  Administered 2014-02-06 – 2014-02-09 (×7): 500 mg via ORAL
  Filled 2014-02-05 (×9): qty 1

## 2014-02-05 MED ORDER — SODIUM CHLORIDE 0.9 % IJ SOLN
3.0000 mL | Freq: Two times a day (BID) | INTRAMUSCULAR | Status: DC
  Administered 2014-02-06 – 2014-02-08 (×6): 3 mL via INTRAVENOUS

## 2014-02-05 MED ORDER — PANTOPRAZOLE SODIUM 40 MG PO TBEC
40.0000 mg | DELAYED_RELEASE_TABLET | Freq: Every day | ORAL | Status: DC
  Administered 2014-02-06 – 2014-02-09 (×4): 40 mg via ORAL
  Filled 2014-02-05 (×4): qty 1

## 2014-02-05 MED ORDER — LEVOTHYROXINE SODIUM 50 MCG PO TABS
50.0000 ug | ORAL_TABLET | Freq: Every day | ORAL | Status: DC
  Administered 2014-02-06 – 2014-02-09 (×4): 50 ug via ORAL
  Filled 2014-02-05 (×5): qty 1

## 2014-02-05 NOTE — ED Notes (Signed)
Per EMS: Pt from home for eval of fall from standing position. Pt states he slipped on the wet floor and hit his head on the brick door stopper. NO LOC noted by family, EMS noted 300-400 cc of blood loss upon arrival. Pt axox 4 en route and upon arrival, pt has injury noted to tongue and family reports abnormal speech. Pt given 250 bolus NS en route. Axo 4 upon arrival. EMS noted unremarkable EKG, pupils 34mm PERRLA. nad noted.

## 2014-02-05 NOTE — Progress Notes (Signed)
This is a patient with a history of a seizure disorder and no prior cardiac disease who had a syncopal event today and hit his head.  In the ED telemetry was concerning for ventricular tachycardia.  However, upon review of these events, it was artifact in one of the leads, with his normal qrs marching through in other leads.  He was asymptomatic at time of telemetry events.  Occational PVCs with different morphology as well.    Stephani Police, MD Cardiology.

## 2014-02-05 NOTE — ED Notes (Signed)
Pt returned from radiology with no runs of vtach.

## 2014-02-05 NOTE — H&P (Signed)
[facility] History and Physical  Gerald Leach WYO:[id number] DOB: 07-Jul-1933 DOA: 02/05/2014  Referring physician: EDP PCP: [name], MD   Chief Complaint: Fall, Syncope   HPI: Gerald Leach is a 80 y.o. male who presents to the ED after a fall occuring at home.  He has a history of a seizure disorder secondary to prior [facility] with resulting enecphalomalacia and chronic L sided weakness and facial droop.  I actually admitted the patient last November for seizures including a witnessed grand-mal seizure in the ED at that time.  He does not recall the events surrounding his fall today or if he had a seizure, he does believe he had LOC at that time.  He did bite his tongue.  He also hit his head on the ground (thinks he landed on a brick) and has a laceration to the back of his head.  Thankfully his CT head was negative for any acute intracranial trauma findings this time, last tetanus shot was in last 5 years.  Review of Systems: Systems reviewed.  As above, otherwise negative  Past Medical History  Diagnosis Date  . Hypertension   . Colonic polyp 2003  . GERD (gastroesophageal reflux disease)   . ED (erectile dysfunction)   . Hyperlipidemia   . Hypothyroidism   . Brain aneurysm   . Seizures     per family   Past Surgical History  Procedure Laterality Date  . Brain aneurysm surgery      at [facility]  . Carotid artery aneurysm    . Colonoscopy      polyps  . Right hernia    . Craniotomy Right 09/19/2012    Procedure: CRANIOTOMY HEMATOMA EVACUATION SUBDURAL;  Surgeon: Otilio Connors, MD;  Location: [place] [facility];  Service: Neurosurgery;  Laterality: Right;  . Craniotomy Right 09/22/2012    Procedure: CRANIOTOMY HEMATOMA EVACUATION SUBDURAL;  Surgeon: Otilio Connors, MD;  Location: [place] [facility];  Service: Neurosurgery;  Laterality: Right;   Social History:  reports that he has never smoked. He has never used smokeless tobacco. He reports that he drinks alcohol. He  reports that he does not use illicit drugs.  No Known Allergies  Family History  Problem Relation Age of Onset  . Liver disease Brother     Died     Prior to Admission medications   Medication Sig Start Date End Date Taking? Authorizing Provider  levETIRAcetam (KEPPRA) 1000 MG tablet Take 1,000 mg by mouth 2 (two) times daily.   Yes Historical Provider, MD  levothyroxine (SYNTHROID, LEVOTHROID) 50 MCG tablet Take 50 mcg by mouth daily before breakfast.   Yes Historical Provider, MD  metFORMIN (GLUCOPHAGE) 500 MG tablet Take 500 mg by mouth 2 (two) times daily with a meal.   Yes Historical Provider, MD  Multiple Vitamin (MULTIVITAMIN WITH MINERALS) TABS tablet Take 1 tablet by mouth daily.   Yes Historical Provider, MD  omeprazole (PRILOSEC) 20 MG capsule Take 20 mg by mouth every Monday, Wednesday, and Friday.  01/06/13  Yes Historical Provider, MD  simvastatin (ZOCOR) 10 MG tablet Take 10 mg by mouth at bedtime.   Yes Historical Provider, MD  ONE TOUCH LANCETS MISC 1 each by Does not apply route daily. 06/27/11   Dorena Cookey, MD   Physical Exam: Filed Vitals:   02/05/14 2246  BP: 151/95  Pulse: 79  Temp:   Resp: 19    BP 151/95  Pulse 79  Temp(Src) 97.9 F (36.6 C) (  Oral)  Resp 19  Ht 6' (1.829 m)  Wt 82.555 kg (182 lb)  BMI 24.68 kg/m2  SpO2 99%  General Appearance:    Alert, oriented, no distress, appears stated age  Head:    Normocephalic, atraumatic  Eyes:    PERRL, EOMI, sclera non-icteric        Nose:   Nares without drainage or epistaxis. Mucosa, turbinates normal  Throat:   Moist mucous membranes. Oropharynx without erythema or exudate.  Neck:   Supple. No carotid bruits.  No thyromegaly.  No lymphadenopathy.   Back:     No CVA tenderness, no spinal tenderness  Lungs:     Clear to auscultation bilaterally, without wheezes, rhonchi or rales  Chest wall:    No tenderness to palpitation  Heart:    Regular rate and rhythm without murmurs, gallops, rubs  Abdomen:      Soft, non-tender, nondistended, normal bowel sounds, no organomegaly  Genitalia:    deferred  Rectal:    deferred  Extremities:   No clubbing, cyanosis or edema.  Pulses:   2+ and symmetric all extremities  Skin:   Skin color, texture, turgor normal, no rashes or lesions  Lymph nodes:   Cervical, supraclavicular, and axillary nodes normal  Neurologic:   CNII-XII intact. Normal strength, sensation and reflexes      throughout    Labs on Admission:  Basic Metabolic Panel:  Recent Labs Lab 02/05/14 1845  NA 137  K 4.0  CL 100  CO2 24  GLUCOSE 112*  BUN 11  CREATININE 0.79  CALCIUM 8.6   Liver Function Tests: No results found for this basename: AST, ALT, ALKPHOS, BILITOT, PROT, ALBUMIN,  in the last 168 hours No results found for this basename: LIPASE, AMYLASE,  in the last 168 hours No results found for this basename: AMMONIA,  in the last 168 hours CBC:  Recent Labs Lab 02/05/14 1845  WBC 12.2*  NEUTROABS 9.1*  HGB 12.8*  HCT 39.1  MCV 80.8  PLT 208   Cardiac Enzymes: No results found for this basename: CKTOTAL, CKMB, CKMBINDEX, TROPONINI,  in the last 168 hours  BNP (last 3 results) No results found for this basename: PROBNP,  in the last 8760 hours CBG: No results found for this basename: GLUCAP,  in the last 168 hours  Radiological Exams on Admission: Dg Chest 1 View  02/05/2014   CLINICAL DATA:  Syncope and tachycardia  EXAM: CHEST - 1 VIEW  COMPARISON:  September 27, 2012  FINDINGS: There is no edema or consolidation. Heart is borderline enlarged with pulmonary vascularity within normal limits. No pneumothorax. No adenopathy. There is old rib trauma on the right.  IMPRESSION: No edema or consolidation.  Heart borderline enlarged.   Electronically Signed   By: Lowella Grip M.D.   On: 02/05/2014 19:48   Dg Thoracic Spine 2 View  02/05/2014   CLINICAL DATA:  80 year old male status post fall with upper back pain. Initial encounter.  EXAM: THORACIC SPINE - 2  VIEW  COMPARISON:  [facility] thoracic spine radiographs 10/20/2012 and earlier, thoracic spine CT myelogram 05/02/2012.  FINDINGS: Chronic severe T7 compression fracture. No significant change in a configuration of that level since 2014. Adjacent levels appear stable and intact. No other thoracic compression fracture. Overall stable thoracic vertebral height and alignment. Normal thoracic segmentation. Grossly intact visualized upper lumbar levels. Grossly stable visualized thoracic visceral contours.  IMPRESSION: 1. Chronic severe T7 compression fracture not significantly changed since 2014. 2.  No acute fracture or listhesis identified in the thoracic spine.   Electronically Signed   By: Lars Pinks M.D.   On: 02/05/2014 19:50   Ct Head Wo Contrast  02/05/2014   CLINICAL DATA:  80 year old male with fall from standing. Posterior head injury and headache with neck pain.  EXAM: CT HEAD WITHOUT CONTRAST  CT CERVICAL SPINE WITHOUT CONTRAST  TECHNIQUE: Multidetector CT imaging of the head and cervical spine was performed following the standard protocol without intravenous contrast. Multiplanar CT image reconstructions of the cervical spine were also generated.  COMPARISON:  05/13/2013.  Brain MRI 05/01/2013.  FINDINGS: CT HEAD FINDINGS  Sequelae of multiple craniotomies on the right. There is trace subcutaneous gas along the right occipital convexity, with laceration and hematoma associated. Hematoma measures up to 13 mm in thickness. Underlying right occipital bone intact. No acute osseous abnormality identified. Mildly increased paranasal sinus mucosal thickening. Mastoids and tympanic cavities are clear. Stable orbits soft tissues.  Chronic coarsely calcified right clinoid region irregular mass, best demonstrated on 05/01/2013 and felt to be meningioma at that time. Stable cerebral volume. Mild ventriculomegaly. No midline shift. No new intracranial mass effect identified. No acute intracranial hemorrhage  identified. Patchy white matter hypodensity. No evidence of cortically based acute infarction identified. Stable CT appearance of the major intracranial vascular structures.  CT CERVICAL SPINE FINDINGS  Overall stable cervical vertebral height and alignment with exaggerated lordosis. Chronic ankylosis at C2-C3. Visualized skull base is intact. No atlanto-occipital dissociation. Cervicothoracic junction alignment is within normal limits. Bilateral posterior element alignment is within normal limits. Moderate to severe chronic facet hypertrophy on the right at C3-C4. And on the left at C4-C5. No acute cervical spine fracture identified. Grossly intact visualized upper thoracic levels. Stable small bone island in the T1 spinous process.  Negative lung apices.  Negative paraspinal soft tissues.  IMPRESSION: 1.  No acute intracranial abnormality. 2. No acute fracture or listhesis identified in the cervical spine. Ligamentous injury is not excluded. 3. Postoperative changes to the skull. Chronic right supraclinoid mass, see MRI report 05/01/2013.   Electronically Signed   By: Lars Pinks M.D.   On: 02/05/2014 19:46   Ct Cervical Spine Wo Contrast  02/05/2014   CLINICAL DATA:  80 year old male with fall from standing. Posterior head injury and headache with neck pain.  EXAM: CT HEAD WITHOUT CONTRAST  CT CERVICAL SPINE WITHOUT CONTRAST  TECHNIQUE: Multidetector CT imaging of the head and cervical spine was performed following the standard protocol without intravenous contrast. Multiplanar CT image reconstructions of the cervical spine were also generated.  COMPARISON:  05/13/2013.  Brain MRI 05/01/2013.  FINDINGS: CT HEAD FINDINGS  Sequelae of multiple craniotomies on the right. There is trace subcutaneous gas along the right occipital convexity, with laceration and hematoma associated. Hematoma measures up to 13 mm in thickness. Underlying right occipital bone intact. No acute osseous abnormality identified. Mildly  increased paranasal sinus mucosal thickening. Mastoids and tympanic cavities are clear. Stable orbits soft tissues.  Chronic coarsely calcified right clinoid region irregular mass, best demonstrated on 05/01/2013 and felt to be meningioma at that time. Stable cerebral volume. Mild ventriculomegaly. No midline shift. No new intracranial mass effect identified. No acute intracranial hemorrhage identified. Patchy white matter hypodensity. No evidence of cortically based acute infarction identified. Stable CT appearance of the major intracranial vascular structures.  CT CERVICAL SPINE FINDINGS  Overall stable cervical vertebral height and alignment with exaggerated lordosis. Chronic ankylosis at C2-C3. Visualized skull base is intact.  No atlanto-occipital dissociation. Cervicothoracic junction alignment is within normal limits. Bilateral posterior element alignment is within normal limits. Moderate to severe chronic facet hypertrophy on the right at C3-C4. And on the left at C4-C5. No acute cervical spine fracture identified. Grossly intact visualized upper thoracic levels. Stable small bone island in the T1 spinous process.  Negative lung apices.  Negative paraspinal soft tissues.  IMPRESSION: 1.  No acute intracranial abnormality. 2. No acute fracture or listhesis identified in the cervical spine. Ligamentous injury is not excluded. 3. Postoperative changes to the skull. Chronic right supraclinoid mass, see MRI report 05/01/2013.   Electronically Signed   By: Lars Pinks M.D.   On: 02/05/2014 19:46    EKG: Independently reviewed.  NSR with frequent PVCs.  There is question of a possible episode of NSVT on the monitor; however, on further review by cardiology they feel that the rhythm strip in question represents artifact.  Assessment/Plan Principal Problem:   Syncope Active Problems:   Diabetes mellitus type II, uncontrolled   Seizure disorder   1. Syncope - strongly suspect patient's LOC and fall at home  is secondary to a seizure given the tongue biting and his extensive history of the same, see above EKG discussion as well regarding the differential of V.Tach on monitor in ED.  He is asymptomatic from a cardiac standpoint.  Patient being admitted for overnight observation, tele monitor, seizure precautions.  Discussed no driving with patient! 2. Seizure disorder - resume home seizure meds 3. DM2 - continue home metformin check CBG AC/HS  Code Status: Full Code  Family Communication: No family in room Disposition Plan: Admit to obs   Time spent: 70 min  Hani Campusano M. [facility] Pager [phone]  If 7AM-7PM, please contact the day team taking care of the patient [email or web address] Password [facility] 02/05/2014, 11:28 PM

## 2014-02-05 NOTE — ED Provider Notes (Signed)
CSN: [id number]     Arrival date & time 02/05/14  1812 History   First MD Initiated Contact with Patient 02/05/14 1822     Chief Complaint  Patient presents with  . Fall  . Head Injury     (Consider location/radiation/quality/duration/timing/severity/associated sxs/prior Treatment) HPI Comments: Patient history of HTN, seizure disorder secondary to subdural hematoma, also status post coiling of a cerebral aneurysm, presents with a fall. He was brought in by EMS. He states that he doesn't remember what lead to the fall. He woke up on the floor. He doesn't know if he had a seizure. He did bite his tongue. He also hit his head on the ground and has a laceration to the back of his head. He denies any chest pain or shortness of breath. He denies any numbness or weakness in his extremities. He notes some left-sided weakness that is unchanged from baseline per his report. He states his last tetanus shot was within the last 5 years.  Patient is a 80 y.o. male presenting with fall and head injury.  Fall Associated symptoms include headaches (Head pain secondary to fall). Pertinent negatives include no chest pain, no abdominal pain and no shortness of breath.  Head Injury Associated symptoms: headache (Head pain secondary to fall) and neck pain   Associated symptoms: no nausea, no numbness and no vomiting     Past Medical History  Diagnosis Date  . Hypertension   . Colonic polyp 2003  . GERD (gastroesophageal reflux disease)   . ED (erectile dysfunction)   . Hyperlipidemia   . Hypothyroidism   . Brain aneurysm   . Seizures     per family   Past Surgical History  Procedure Laterality Date  . Brain aneurysm surgery      at [facility]  . Carotid artery aneurysm    . Colonoscopy      polyps  . Right hernia    . Craniotomy Right 09/19/2012    Procedure: CRANIOTOMY HEMATOMA EVACUATION SUBDURAL;  Surgeon: Otilio Connors, MD;  Location: [place] [facility];  Service: Neurosurgery;  Laterality: Right;   . Craniotomy Right 09/22/2012    Procedure: CRANIOTOMY HEMATOMA EVACUATION SUBDURAL;  Surgeon: Otilio Connors, MD;  Location: [place] [facility];  Service: Neurosurgery;  Laterality: Right;   Family History  Problem Relation Age of Onset  . Liver disease Brother     Died   History  Substance Use Topics  . Smoking status: Never Smoker   . Smokeless tobacco: Never Used  . Alcohol Use: Yes     Comment: rum mixed in diet coke 3 a week    Review of Systems  Constitutional: Negative for fever, chills, diaphoresis and fatigue.  HENT: Negative for congestion, rhinorrhea and sneezing.   Eyes: Negative.   Respiratory: Negative for cough, chest tightness and shortness of breath.   Cardiovascular: Negative for chest pain and leg swelling.  Gastrointestinal: Negative for nausea, vomiting, abdominal pain, diarrhea and blood in stool.  Genitourinary: Negative for frequency, hematuria, flank pain and difficulty urinating.  Musculoskeletal: Positive for back pain and neck pain. Negative for arthralgias.  Skin: Negative for rash.  Neurological: Positive for syncope and headaches (Head pain secondary to fall). Negative for dizziness, speech difficulty, weakness and numbness.      Allergies  Review of patient's allergies indicates no known allergies.  Home Medications   Prior to Admission medications   Medication Sig Start Date End Date Taking? Authorizing Provider  levETIRAcetam (KEPPRA) 1000 MG  tablet Take 1,000 mg by mouth 2 (two) times daily.   Yes Historical Provider, MD  levothyroxine (SYNTHROID, LEVOTHROID) 50 MCG tablet Take 50 mcg by mouth daily before breakfast.   Yes Historical Provider, MD  metFORMIN (GLUCOPHAGE) 500 MG tablet Take 500 mg by mouth 2 (two) times daily with a meal.   Yes Historical Provider, MD  Multiple Vitamin (MULTIVITAMIN WITH MINERALS) TABS tablet Take 1 tablet by mouth daily.   Yes Historical Provider, MD  omeprazole (PRILOSEC) 20 MG capsule Take 20 mg by mouth every  Monday, Wednesday, and Friday.  01/06/13  Yes Historical Provider, MD  simvastatin (ZOCOR) 10 MG tablet Take 10 mg by mouth at bedtime.   Yes Historical Provider, MD  ONE TOUCH LANCETS MISC 1 each by Does not apply route daily. 06/27/11   Dorena Cookey, MD   BP 121/76  Pulse 77  Temp(Src) 97.9 F (36.6 C) (Oral)  Resp 13  Ht 6' (1.829 m)  Wt 182 lb (82.555 kg)  BMI 24.68 kg/m2  SpO2 99% Physical Exam  Constitutional: He is oriented to person, place, and time. He appears well-developed and well-nourished.  HENT:  Head: Normocephalic and atraumatic.  Small abrasion to the tip of his tongue  Eyes: Pupils are equal, round, and reactive to light.  Neck:  Patient is in a c-collar. He has tenderness to the lower cervical spine and upper thoracic spine. There is no pain to the lumbosacral spine. No step-offs or deformities are noted.  Cardiovascular: Normal rate, regular rhythm and normal heart sounds.   Pulmonary/Chest: Effort normal and breath sounds normal. No respiratory distress. He has no wheezes. He has no rales. He exhibits no tenderness.  Abdominal: Soft. Bowel sounds are normal. There is no tenderness. There is no rebound and no guarding.  Musculoskeletal: Normal range of motion. He exhibits no edema.  No pain on palpation or range of motion of extremities  Lymphadenopathy:    He has no cervical adenopathy.  Neurological: He is alert and oriented to person, place, and time. No cranial nerve deficit. Coordination normal.  Patient has 4 out of 5 motor strength in his left lower extremity, 5 out of 5 in all other extremities. Sensation grossly intact to light touch in all extremities. Finger to nose intact. No facial drooping  Skin: Skin is warm and dry. No rash noted.  Psychiatric: He has a normal mood and affect.    ED Course  LACERATION REPAIR Date/Time: 02/05/2014 10:46 PM Performed by: Halayna Blane Authorized by: Malvin Johns Consent: Verbal consent obtained. Risks and  benefits: risks, benefits and alternatives were discussed Consent given by: patient Body area: head/neck Location details: scalp Laceration length: 4 cm Foreign bodies: no foreign bodies Tendon involvement: none Nerve involvement: none Vascular damage: no Anesthesia: local infiltration Local anesthetic: lidocaine 1% with epinephrine Anesthetic total: 3 ml Patient sedated: no Preparation: Patient was prepped and draped in the usual sterile fashion. Irrigation solution: saline Irrigation method: syringe Amount of cleaning: standard Debridement: none Degree of undermining: none Skin closure: staples Number of sutures: 6 Technique: simple Approximation: close Approximation difficulty: simple Patient tolerance: Patient tolerated the procedure well with no immediate complications.   (including critical care time) Labs Review Labs Reviewed  CBC WITH DIFFERENTIAL - Abnormal; Notable for the following:    WBC 12.2 (*)    Hemoglobin 12.8 (*)    Neutro Abs 9.1 (*)    All other components within normal limits  BASIC METABOLIC PANEL - Abnormal; Notable for  the following:    Glucose, Bld 112 (*)    GFR calc non Af Amer 84 (*)    All other components within normal limits  I-STAT TROPOININ, ED    Imaging Review Dg Chest 1 View  02/05/2014   CLINICAL DATA:  Syncope and tachycardia  EXAM: CHEST - 1 VIEW  COMPARISON:  September 27, 2012  FINDINGS: There is no edema or consolidation. Heart is borderline enlarged with pulmonary vascularity within normal limits. No pneumothorax. No adenopathy. There is old rib trauma on the right.  IMPRESSION: No edema or consolidation.  Heart borderline enlarged.   Electronically Signed   By: Lowella Grip M.D.   On: 02/05/2014 19:48   Dg Thoracic Spine 2 View  02/05/2014   CLINICAL DATA:  80 year old male status post fall with upper back pain. Initial encounter.  EXAM: THORACIC SPINE - 2 VIEW  COMPARISON:  [facility] thoracic spine radiographs  10/20/2012 and earlier, thoracic spine CT myelogram 05/02/2012.  FINDINGS: Chronic severe T7 compression fracture. No significant change in a configuration of that level since 2014. Adjacent levels appear stable and intact. No other thoracic compression fracture. Overall stable thoracic vertebral height and alignment. Normal thoracic segmentation. Grossly intact visualized upper lumbar levels. Grossly stable visualized thoracic visceral contours.  IMPRESSION: 1. Chronic severe T7 compression fracture not significantly changed since 2014. 2.  No acute fracture or listhesis identified in the thoracic spine.   Electronically Signed   By: Lars Pinks M.D.   On: 02/05/2014 19:50   Ct Head Wo Contrast  02/05/2014   CLINICAL DATA:  80 year old male with fall from standing. Posterior head injury and headache with neck pain.  EXAM: CT HEAD WITHOUT CONTRAST  CT CERVICAL SPINE WITHOUT CONTRAST  TECHNIQUE: Multidetector CT imaging of the head and cervical spine was performed following the standard protocol without intravenous contrast. Multiplanar CT image reconstructions of the cervical spine were also generated.  COMPARISON:  05/13/2013.  Brain MRI 05/01/2013.  FINDINGS: CT HEAD FINDINGS  Sequelae of multiple craniotomies on the right. There is trace subcutaneous gas along the right occipital convexity, with laceration and hematoma associated. Hematoma measures up to 13 mm in thickness. Underlying right occipital bone intact. No acute osseous abnormality identified. Mildly increased paranasal sinus mucosal thickening. Mastoids and tympanic cavities are clear. Stable orbits soft tissues.  Chronic coarsely calcified right clinoid region irregular mass, best demonstrated on 05/01/2013 and felt to be meningioma at that time. Stable cerebral volume. Mild ventriculomegaly. No midline shift. No new intracranial mass effect identified. No acute intracranial hemorrhage identified. Patchy white matter hypodensity. No evidence of  cortically based acute infarction identified. Stable CT appearance of the major intracranial vascular structures.  CT CERVICAL SPINE FINDINGS  Overall stable cervical vertebral height and alignment with exaggerated lordosis. Chronic ankylosis at C2-C3. Visualized skull base is intact. No atlanto-occipital dissociation. Cervicothoracic junction alignment is within normal limits. Bilateral posterior element alignment is within normal limits. Moderate to severe chronic facet hypertrophy on the right at C3-C4. And on the left at C4-C5. No acute cervical spine fracture identified. Grossly intact visualized upper thoracic levels. Stable small bone island in the T1 spinous process.  Negative lung apices.  Negative paraspinal soft tissues.  IMPRESSION: 1.  No acute intracranial abnormality. 2. No acute fracture or listhesis identified in the cervical spine. Ligamentous injury is not excluded. 3. Postoperative changes to the skull. Chronic right supraclinoid mass, see MRI report 05/01/2013.   Electronically Signed   By: Lars Pinks  M.D.   On: 02/05/2014 19:46   Ct Cervical Spine Wo Contrast  02/05/2014   CLINICAL DATA:  80 year old male with fall from standing. Posterior head injury and headache with neck pain.  EXAM: CT HEAD WITHOUT CONTRAST  CT CERVICAL SPINE WITHOUT CONTRAST  TECHNIQUE: Multidetector CT imaging of the head and cervical spine was performed following the standard protocol without intravenous contrast. Multiplanar CT image reconstructions of the cervical spine were also generated.  COMPARISON:  05/13/2013.  Brain MRI 05/01/2013.  FINDINGS: CT HEAD FINDINGS  Sequelae of multiple craniotomies on the right. There is trace subcutaneous gas along the right occipital convexity, with laceration and hematoma associated. Hematoma measures up to 13 mm in thickness. Underlying right occipital bone intact. No acute osseous abnormality identified. Mildly increased paranasal sinus mucosal thickening. Mastoids and  tympanic cavities are clear. Stable orbits soft tissues.  Chronic coarsely calcified right clinoid region irregular mass, best demonstrated on 05/01/2013 and felt to be meningioma at that time. Stable cerebral volume. Mild ventriculomegaly. No midline shift. No new intracranial mass effect identified. No acute intracranial hemorrhage identified. Patchy white matter hypodensity. No evidence of cortically based acute infarction identified. Stable CT appearance of the major intracranial vascular structures.  CT CERVICAL SPINE FINDINGS  Overall stable cervical vertebral height and alignment with exaggerated lordosis. Chronic ankylosis at C2-C3. Visualized skull base is intact. No atlanto-occipital dissociation. Cervicothoracic junction alignment is within normal limits. Bilateral posterior element alignment is within normal limits. Moderate to severe chronic facet hypertrophy on the right at C3-C4. And on the left at C4-C5. No acute cervical spine fracture identified. Grossly intact visualized upper thoracic levels. Stable small bone island in the T1 spinous process.  Negative lung apices.  Negative paraspinal soft tissues.  IMPRESSION: 1.  No acute intracranial abnormality. 2. No acute fracture or listhesis identified in the cervical spine. Ligamentous injury is not excluded. 3. Postoperative changes to the skull. Chronic right supraclinoid mass, see MRI report 05/01/2013.   Electronically Signed   By: Lars Pinks M.D.   On: 02/05/2014 19:46     EKG Interpretation   Date/Time:  Friday February 05 2014 18:57:14 EDT Ventricular Rate:  63 PR Interval:  181 QRS Duration: 102 QT Interval:  432 QTC Calculation: 442 R Axis:   -16 Text Interpretation:  Sinus rhythm Borderline left axis deviation since  last tracing no significant change Confirmed by Nilay Mangrum  MD, [place] Craigo ([id number])  on 02/05/2014 7:43:04 PM      MDM   Final diagnoses:  Syncope, unspecified syncope type    Pt presents after a syncopal episode.  No  evidence of ICH, cervical fracture or other injuries.  No seizure activity noted per family.  During the ED stay, pt had 2 episodes of what appears to be a wide complex tachycardia, lasting 30-45 seconds each.  Pt was asymptomatic, lying on the bed during these episodes.  Cardiology was consulted and pt was seen.  Dr Kennith Center felt that the complexes were most likely artifact.  However, given that the pt did have a significant syncopal event without evidence of seizure activity, I still felt that the pt should be admitted.  I spoke with Dr. Alcario Drought who will admit the pt.   Malvin Johns, MD 02/05/14 2250

## 2014-02-05 NOTE — ED Notes (Signed)
This RN to accompany pt to radiology.

## 2014-02-05 NOTE — ED Notes (Signed)
Pt has had 2 20-30 second runs of vtach. EDP aware and at bedside. Pt a/o at this time.

## 2014-02-06 DIAGNOSIS — G40309 Generalized idiopathic epilepsy and epileptic syndromes, not intractable, without status epilepticus: Secondary | ICD-10-CM

## 2014-02-06 DIAGNOSIS — Z9889 Other specified postprocedural states: Secondary | ICD-10-CM

## 2014-02-06 DIAGNOSIS — I10 Essential (primary) hypertension: Secondary | ICD-10-CM

## 2014-02-06 LAB — GLUCOSE, CAPILLARY
GLUCOSE-CAPILLARY: 120 mg/dL — AB (ref 70–99)
GLUCOSE-CAPILLARY: 124 mg/dL — AB (ref 70–99)
Glucose-Capillary: 114 mg/dL — ABNORMAL HIGH (ref 70–99)
Glucose-Capillary: 131 mg/dL — ABNORMAL HIGH (ref 70–99)
Glucose-Capillary: 181 mg/dL — ABNORMAL HIGH (ref 70–99)

## 2014-02-06 LAB — CBC
HEMATOCRIT: 36.5 % — AB (ref 39.0–52.0)
HEMOGLOBIN: 12.1 g/dL — AB (ref 13.0–17.0)
MCH: 26.4 pg (ref 26.0–34.0)
MCHC: 33.2 g/dL (ref 30.0–36.0)
MCV: 79.7 fL (ref 78.0–100.0)
Platelets: 204 10*3/uL (ref 150–400)
RBC: 4.58 MIL/uL (ref 4.22–5.81)
RDW: 15 % (ref 11.5–15.5)
WBC: 9.2 10*3/uL (ref 4.0–10.5)

## 2014-02-06 LAB — URINALYSIS, ROUTINE W REFLEX MICROSCOPIC
Bilirubin Urine: NEGATIVE
Glucose, UA: NEGATIVE mg/dL
Hgb urine dipstick: NEGATIVE
Ketones, ur: NEGATIVE mg/dL
Leukocytes, UA: NEGATIVE
NITRITE: NEGATIVE
Protein, ur: NEGATIVE mg/dL
SPECIFIC GRAVITY, URINE: 1.014 (ref 1.005–1.030)
UROBILINOGEN UA: 1 mg/dL (ref 0.0–1.0)
pH: 7.5 (ref 5.0–8.0)

## 2014-02-06 LAB — BASIC METABOLIC PANEL
Anion gap: 13 (ref 5–15)
BUN: 10 mg/dL (ref 6–23)
CALCIUM: 8.7 mg/dL (ref 8.4–10.5)
CO2: 23 mEq/L (ref 19–32)
Chloride: 101 mEq/L (ref 96–112)
Creatinine, Ser: 0.74 mg/dL (ref 0.50–1.35)
GFR calc Af Amer: >90 mL/min (ref >90)
GFR calc non Af Amer: 86 mL/min — ABNORMAL LOW (ref >90)
GLUCOSE: 108 mg/dL — AB (ref 70–99)
Potassium: 3.9 mEq/L (ref 3.7–5.3)
SODIUM: 137 meq/L (ref 137–147)

## 2014-02-06 LAB — TSH: TSH: 6.14 u[IU]/mL — AB (ref 0.350–4.500)

## 2014-02-06 LAB — T4, FREE: FREE T4: 1.22 ng/dL (ref 0.80–1.80)

## 2014-02-06 MED ORDER — HALOPERIDOL 2 MG PO TABS
2.0000 mg | ORAL_TABLET | Freq: Four times a day (QID) | ORAL | Status: DC | PRN
  Filled 2014-02-06: qty 1

## 2014-02-06 NOTE — Progress Notes (Addendum)
PROGRESS NOTE  Gerald Leach CVE:[id number] DOB: [birth year] DOA: 02/05/2014 PCP: [name], MD  Assessment/Plan: Syncope - strongly suspect patient's LOC and fall at home is secondary to a seizure given the tongue biting and his extensive history of the same Discussed no driving with patient -PT eval -MRI   AMS/dementia -unsteady on feet at home per son -PT eval -haldol PRN  V.Tach on monitor in ED. He is asymptomatic from a cardiac standpoint. tele monitor - seen by Dr. Kennith Center- was not true V tac  Seizure disorder - resume home seizure meds   S/p craniotomy  DM2 - continue home metformin  -CBG AC/HS   Code Status: full Family Communication: son on phone Disposition Plan:    Consultants:  neuro  Procedures:     HPI/Subjective: Up trying to get shorts on- very unsteady on feet Wants to go home  Objective: Filed Vitals:   02/06/14 0529  BP: 144/74  Pulse: 71  Temp: 97.5 F (36.4 C)  Resp: 18    Intake/Output Summary (Last 24 hours) at 02/06/14 0908 Last data filed at 02/06/14 0500  Gross per 24 hour  Intake    120 ml  Output    200 ml  Net    -80 ml   Filed Weights   02/05/14 1816 02/06/14 0009  Weight: 82.555 kg (182 lb) 83.9 kg (184 lb 15.5 oz)    Exam:   General:  confused  Cardiovascular: rrr  Respiratory: clear  Abdomen: +Bs, soft  Musculoskeletal: moves all 4 ext   Data Reviewed: Basic Metabolic Panel:  Recent Labs Lab 02/05/14 1845 02/06/14 0429  NA 137 137  K 4.0 3.9  CL 100 101  CO2 24 23  GLUCOSE 112* 108*  BUN 11 10  CREATININE 0.79 0.74  CALCIUM 8.6 8.7   Liver Function Tests: No results found for this basename: AST, ALT, ALKPHOS, BILITOT, PROT, ALBUMIN,  in the last 168 hours No results found for this basename: LIPASE, AMYLASE,  in the last 168 hours No results found for this basename: AMMONIA,  in the last 168 hours CBC:  Recent Labs Lab 02/05/14 1845 02/06/14 0429  WBC 12.2* 9.2  NEUTROABS  9.1*  --   HGB 12.8* 12.1*  HCT 39.1 36.5*  MCV 80.8 79.7  PLT 208 204   Cardiac Enzymes: No results found for this basename: CKTOTAL, CKMB, CKMBINDEX, TROPONINI,  in the last 168 hours BNP (last 3 results) No results found for this basename: PROBNP,  in the last 8760 hours CBG:  Recent Labs Lab 02/06/14 0018 02/06/14 0604  GLUCAP 181* 120*    No results found for this or any previous visit (from the past 240 hour(s)).   Studies: Dg Chest 1 View  02/05/2014   CLINICAL DATA:  Syncope and tachycardia  EXAM: CHEST - 1 VIEW  COMPARISON:  September 27, 2012  FINDINGS: There is no edema or consolidation. Heart is borderline enlarged with pulmonary vascularity within normal limits. No pneumothorax. No adenopathy. There is old rib trauma on the right.  IMPRESSION: No edema or consolidation.  Heart borderline enlarged.   Electronically Signed   By: Lowella Grip M.D.   On: 02/05/2014 19:48   Dg Thoracic Spine 2 View  02/05/2014   CLINICAL DATA:  80 year old male status post fall with upper back pain. Initial encounter.  EXAM: THORACIC SPINE - 2 VIEW  COMPARISON:  [facility] thoracic spine radiographs 10/20/2012 and earlier, thoracic spine CT myelogram 05/02/2012.  FINDINGS: Chronic severe  T7 compression fracture. No significant change in a configuration of that level since 2014. Adjacent levels appear stable and intact. No other thoracic compression fracture. Overall stable thoracic vertebral height and alignment. Normal thoracic segmentation. Grossly intact visualized upper lumbar levels. Grossly stable visualized thoracic visceral contours.  IMPRESSION: 1. Chronic severe T7 compression fracture not significantly changed since 2014. 2.  No acute fracture or listhesis identified in the thoracic spine.   Electronically Signed   By: Lars Pinks M.D.   On: 02/05/2014 19:50   Ct Head Wo Contrast  02/05/2014   CLINICAL DATA:  80 year old male with fall from standing. Posterior head injury and  headache with neck pain.  EXAM: CT HEAD WITHOUT CONTRAST  CT CERVICAL SPINE WITHOUT CONTRAST  TECHNIQUE: Multidetector CT imaging of the head and cervical spine was performed following the standard protocol without intravenous contrast. Multiplanar CT image reconstructions of the cervical spine were also generated.  COMPARISON:  05/13/2013.  Brain MRI 05/01/2013.  FINDINGS: CT HEAD FINDINGS  Sequelae of multiple craniotomies on the right. There is trace subcutaneous gas along the right occipital convexity, with laceration and hematoma associated. Hematoma measures up to 13 mm in thickness. Underlying right occipital bone intact. No acute osseous abnormality identified. Mildly increased paranasal sinus mucosal thickening. Mastoids and tympanic cavities are clear. Stable orbits soft tissues.  Chronic coarsely calcified right clinoid region irregular mass, best demonstrated on 05/01/2013 and felt to be meningioma at that time. Stable cerebral volume. Mild ventriculomegaly. No midline shift. No new intracranial mass effect identified. No acute intracranial hemorrhage identified. Patchy white matter hypodensity. No evidence of cortically based acute infarction identified. Stable CT appearance of the major intracranial vascular structures.  CT CERVICAL SPINE FINDINGS  Overall stable cervical vertebral height and alignment with exaggerated lordosis. Chronic ankylosis at C2-C3. Visualized skull base is intact. No atlanto-occipital dissociation. Cervicothoracic junction alignment is within normal limits. Bilateral posterior element alignment is within normal limits. Moderate to severe chronic facet hypertrophy on the right at C3-C4. And on the left at C4-C5. No acute cervical spine fracture identified. Grossly intact visualized upper thoracic levels. Stable small bone island in the T1 spinous process.  Negative lung apices.  Negative paraspinal soft tissues.  IMPRESSION: 1.  No acute intracranial abnormality. 2. No acute  fracture or listhesis identified in the cervical spine. Ligamentous injury is not excluded. 3. Postoperative changes to the skull. Chronic right supraclinoid mass, see MRI report 05/01/2013.   Electronically Signed   By: Lars Pinks M.D.   On: 02/05/2014 19:46   Ct Cervical Spine Wo Contrast  02/05/2014   CLINICAL DATA:  80 year old male with fall from standing. Posterior head injury and headache with neck pain.  EXAM: CT HEAD WITHOUT CONTRAST  CT CERVICAL SPINE WITHOUT CONTRAST  TECHNIQUE: Multidetector CT imaging of the head and cervical spine was performed following the standard protocol without intravenous contrast. Multiplanar CT image reconstructions of the cervical spine were also generated.  COMPARISON:  05/13/2013.  Brain MRI 05/01/2013.  FINDINGS: CT HEAD FINDINGS  Sequelae of multiple craniotomies on the right. There is trace subcutaneous gas along the right occipital convexity, with laceration and hematoma associated. Hematoma measures up to 13 mm in thickness. Underlying right occipital bone intact. No acute osseous abnormality identified. Mildly increased paranasal sinus mucosal thickening. Mastoids and tympanic cavities are clear. Stable orbits soft tissues.  Chronic coarsely calcified right clinoid region irregular mass, best demonstrated on 05/01/2013 and felt to be meningioma at that time. Stable cerebral volume.  Mild ventriculomegaly. No midline shift. No new intracranial mass effect identified. No acute intracranial hemorrhage identified. Patchy white matter hypodensity. No evidence of cortically based acute infarction identified. Stable CT appearance of the major intracranial vascular structures.  CT CERVICAL SPINE FINDINGS  Overall stable cervical vertebral height and alignment with exaggerated lordosis. Chronic ankylosis at C2-C3. Visualized skull base is intact. No atlanto-occipital dissociation. Cervicothoracic junction alignment is within normal limits. Bilateral posterior element  alignment is within normal limits. Moderate to severe chronic facet hypertrophy on the right at C3-C4. And on the left at C4-C5. No acute cervical spine fracture identified. Grossly intact visualized upper thoracic levels. Stable small bone island in the T1 spinous process.  Negative lung apices.  Negative paraspinal soft tissues.  IMPRESSION: 1.  No acute intracranial abnormality. 2. No acute fracture or listhesis identified in the cervical spine. Ligamentous injury is not excluded. 3. Postoperative changes to the skull. Chronic right supraclinoid mass, see MRI report 05/01/2013.   Electronically Signed   By: Lars Pinks M.D.   On: 02/05/2014 19:46    Scheduled Meds: . levETIRAcetam  1,000 mg Oral BID  . levothyroxine  50 mcg Oral QAC breakfast  . metFORMIN  500 mg Oral BID WC  . multivitamin with minerals  1 tablet Oral Daily  . pantoprazole  40 mg Oral Daily  . simvastatin  10 mg Oral QHS  . sodium chloride  3 mL Intravenous Q12H   Continuous Infusions:  Antibiotics Given (last 72 hours)   None      Principal Problem:   Syncope Active Problems:   Diabetes mellitus type II, uncontrolled   Seizure disorder    Time spent: 35 min    Joyous Gleghorn  [facility] Pager [phone]. If 7PM-7AM, please contact night-coverage at [email or web address], password [facility] 02/06/2014, 9:08 AM  LOS: 1 day

## 2014-02-06 NOTE — Progress Notes (Signed)
UR completed 

## 2014-02-06 NOTE — Consult Note (Signed)
Reason for Consult: Recurrent generalized seizure.  HPI:                                                                                                                                          Gerald Leach is an 80 y.o. male history of hemorrhagic stroke associated with aneurysm rupture, hypertension, hyperlipidemia and seizure disorder, to the emergency room yesterday after being found unconscious at home. Patient's wife indicated that he was exhibiting seizure activity when she initially saw him. His been taking Keppra 1000 mg twice a day. CT scan of his head showed no acute intracranial abnormality. MRI of the brain is pending.  Past Medical History  Diagnosis Date  . Hypertension   . Colonic polyp 2003  . GERD (gastroesophageal reflux disease)   . ED (erectile dysfunction)   . Hyperlipidemia   . Hypothyroidism   . Brain aneurysm   . Seizures     per family    Past Surgical History  Procedure Laterality Date  . Brain aneurysm surgery      at [facility]  . Carotid artery aneurysm    . Colonoscopy      polyps  . Right hernia    . Craniotomy Right 09/19/2012    Procedure: CRANIOTOMY HEMATOMA EVACUATION SUBDURAL;  Surgeon: Otilio Connors, MD;  Location: [place] [facility];  Service: Neurosurgery;  Laterality: Right;  . Craniotomy Right 09/22/2012    Procedure: CRANIOTOMY HEMATOMA EVACUATION SUBDURAL;  Surgeon: Otilio Connors, MD;  Location: [place] [facility];  Service: Neurosurgery;  Laterality: Right;    Family History  Problem Relation Age of Onset  . Liver disease Brother     Died    Social History:  reports that he has never smoked. He has never used smokeless tobacco. He reports that he drinks alcohol. He reports that he does not use illicit drugs.  No Known Allergies  MEDICATIONS:                                                                                                                     I have reviewed the patient's current medications.   ROS:  History obtained from spouse and the patient  General ROS: negative for - chills, fatigue, fever, night sweats, weight gain or weight loss Psychological ROS: negative for - behavioral disorder, hallucinations, memory difficulties, mood swings or suicidal ideation Ophthalmic ROS: negative for - blurry vision, double vision, eye pain or loss of vision ENT ROS: negative for - epistaxis, nasal discharge, oral lesions, sore throat, tinnitus or vertigo Allergy and Immunology ROS: negative for - hives or itchy/watery eyes Hematological and Lymphatic ROS: negative for - bleeding problems, bruising or swollen lymph nodes Endocrine ROS: negative for - galactorrhea, hair pattern changes, polydipsia/polyuria or temperature intolerance Respiratory ROS: negative for - cough, hemoptysis, shortness of breath or wheezing Cardiovascular ROS: negative for - chest pain, dyspnea on exertion, edema or irregular heartbeat Gastrointestinal ROS: negative for - abdominal pain, diarrhea, hematemesis, nausea/vomiting or stool incontinence Genito-Urinary ROS: negative for - dysuria, hematuria, incontinence or urinary frequency/urgency Musculoskeletal ROS: negative for - joint swelling or muscular weakness Neurological ROS: as noted in HPI Dermatological ROS: negative for rash and skin lesion changes   Blood pressure 144/74, pulse 71, temperature 97.5 F (36.4 C), temperature source Oral, resp. rate 18, height 6' (1.829 m), weight 83.9 kg (184 lb 15.5 oz), SpO2 97.00%.   Neurologic Examination:                                                                                                      Mental Status: Alert, oriented to person and place but disoriented to time slightly.  Speech fluent without evidence of aphasia. Able to follow commands without difficulty. Cranial Nerves: II-Visual fields were  normal. III/IV/VI-Pupils were equal and reacted. Extraocular movements were full and conjugate.    V/VII-no facial numbness and no facial weakness. VIII-normal. X-normal speech and symmetrical palatal movement. Motor: 5/5 bilaterally with normal tone and bulk Sensory: Normal throughout. Deep Tendon Reflexes: 1+ and symmetric. Plantars: Flexor on the right and mute on the left Cerebellar: Normal finger-to-nose testing.  Lab Results  Component Value Date/Time   CHOL 152 05/28/2012  9:44 AM    Results for orders placed during the hospital encounter of 02/05/14 (from the past 48 hour(s))  CBC WITH DIFFERENTIAL     Status: Abnormal   Collection Time    02/05/14  6:45 PM      Result Value Ref Range   WBC 12.2 (*) 4.0 - 10.5 K/uL   RBC 4.84  4.22 - 5.81 MIL/uL   Hemoglobin 12.8 (*) 13.0 - 17.0 g/dL   HCT 39.1  39.0 - 52.0 %   MCV 80.8  78.0 - 100.0 fL   MCH 26.4  26.0 - 34.0 pg   MCHC 32.7  30.0 - 36.0 g/dL   RDW 15.0  11.5 - 15.5 %   Platelets 208  150 - 400 K/uL   Neutrophils Relative % 74  43 - 77 %   Neutro Abs 9.1 (*) 1.7 - 7.7 K/uL   Lymphocytes Relative 17  12 - 46 %   Lymphs Abs 2.1  0.7 - 4.0 K/uL   Monocytes Relative 8  3 - 12 %   Monocytes Absolute 0.9  0.1 - 1.0 K/uL   Eosinophils Relative 1  0 - 5 %   Eosinophils Absolute 0.1  0.0 - 0.7 K/uL   Basophils Relative 0  0 - 1 %   Basophils Absolute 0.0  0.0 - 0.1 K/uL  BASIC METABOLIC PANEL     Status: Abnormal   Collection Time    02/05/14  6:45 PM      Result Value Ref Range   Sodium 137  137 - 147 mEq/L   Potassium 4.0  3.7 - 5.3 mEq/L   Chloride 100  96 - 112 mEq/L   CO2 24  19 - 32 mEq/L   Glucose, Bld 112 (*) 70 - 99 mg/dL   BUN 11  6 - 23 mg/dL   Creatinine, Ser 0.79  0.50 - 1.35 mg/dL   Calcium 8.6  8.4 - 10.5 mg/dL   GFR calc non Af Amer 84 (*) >90 mL/min   GFR calc Af Amer >90  >90 mL/min   Comment: (NOTE)     The eGFR has been calculated using the CKD EPI equation.     This calculation has not been  validated in all clinical situations.     eGFR's persistently <90 mL/min signify possible Chronic Kidney     Disease.   Anion gap 13  5 - 15  I-STAT TROPOININ, ED     Status: None   Collection Time    02/05/14  7:10 PM      Result Value Ref Range   Troponin i, poc 0.00  0.00 - 0.08 ng/mL   Comment 3            Comment: Due to the release kinetics of cTnI,     a negative result within the first hours     of the onset of symptoms does not rule out     myocardial infarction with certainty.     If myocardial infarction is still suspected,     repeat the test at appropriate intervals.  GLUCOSE, CAPILLARY     Status: Abnormal   Collection Time    02/06/14 12:18 AM      Result Value Ref Range   Glucose-Capillary 181 (*) 70 - 99 mg/dL  CBC     Status: Abnormal   Collection Time    02/06/14  4:29 AM      Result Value Ref Range   WBC 9.2  4.0 - 10.5 K/uL   RBC 4.58  4.22 - 5.81 MIL/uL   Hemoglobin 12.1 (*) 13.0 - 17.0 g/dL   HCT 36.5 (*) 39.0 - 52.0 %   MCV 79.7  78.0 - 100.0 fL   MCH 26.4  26.0 - 34.0 pg   MCHC 33.2  30.0 - 36.0 g/dL   RDW 15.0  11.5 - 15.5 %   Platelets 204  150 - 400 K/uL  BASIC METABOLIC PANEL     Status: Abnormal   Collection Time    02/06/14  4:29 AM      Result Value Ref Range   Sodium 137  137 - 147 mEq/L   Potassium 3.9  3.7 - 5.3 mEq/L   Chloride 101  96 - 112 mEq/L   CO2 23  19 - 32 mEq/L   Glucose, Bld 108 (*) 70 - 99 mg/dL   BUN 10  6 - 23 mg/dL   Creatinine, Ser 0.74  0.50 - 1.35 mg/dL   Calcium 8.7  8.4 - 10.5 mg/dL   GFR calc non Af Amer 86 (*) >90 mL/min   GFR calc Af Amer >90  >90 mL/min   Comment: (NOTE)     The eGFR has been calculated using the CKD EPI equation.     This calculation has not been validated in all clinical situations.     eGFR's persistently <90 mL/min signify possible Chronic Kidney     Disease.   Anion gap 13  5 - 15  TSH     Status: Abnormal   Collection Time    02/06/14  4:29 AM      Result Value Ref Range   TSH  6.140 (*) 0.350 - 4.500 uIU/mL  GLUCOSE, CAPILLARY     Status: Abnormal   Collection Time    02/06/14  6:04 AM      Result Value Ref Range   Glucose-Capillary 120 (*) 70 - 99 mg/dL  URINALYSIS, ROUTINE W REFLEX MICROSCOPIC     Status: None   Collection Time    02/06/14 10:44 AM      Result Value Ref Range   Color, Urine YELLOW  YELLOW   APPearance CLEAR  CLEAR   Specific Gravity, Urine 1.014  1.005 - 1.030   pH 7.5  5.0 - 8.0   Glucose, UA NEGATIVE  NEGATIVE mg/dL   Hgb urine dipstick NEGATIVE  NEGATIVE   Bilirubin Urine NEGATIVE  NEGATIVE   Ketones, ur NEGATIVE  NEGATIVE mg/dL   Protein, ur NEGATIVE  NEGATIVE mg/dL   Urobilinogen, UA 1.0  0.0 - 1.0 mg/dL   Nitrite NEGATIVE  NEGATIVE   Leukocytes, UA NEGATIVE  NEGATIVE   Comment: MICROSCOPIC NOT DONE ON URINES WITH NEGATIVE PROTEIN, BLOOD, LEUKOCYTES, NITRITE, OR GLUCOSE <1000 mg/dL.  GLUCOSE, CAPILLARY     Status: Abnormal   Collection Time    02/06/14 11:22 AM      Result Value Ref Range   Glucose-Capillary 124 (*) 70 - 99 mg/dL   Comment 1 Notify RN      Dg Chest 1 View  02/05/2014   CLINICAL DATA:  Syncope and tachycardia  EXAM: CHEST - 1 VIEW  COMPARISON:  September 27, 2012  FINDINGS: There is no edema or consolidation. Heart is borderline enlarged with pulmonary vascularity within normal limits. No pneumothorax. No adenopathy. There is old rib trauma on the right.  IMPRESSION: No edema or consolidation.  Heart borderline enlarged.   Electronically Signed   By: Lowella Grip M.D.   On: 02/05/2014 19:48   Dg Thoracic Spine 2 View  02/05/2014   CLINICAL DATA:  80 year old male status post fall with upper back pain. Initial encounter.  EXAM: THORACIC SPINE - 2 VIEW  COMPARISON:  [facility] thoracic spine radiographs 10/20/2012 and earlier, thoracic spine CT myelogram 05/02/2012.  FINDINGS: Chronic severe T7 compression fracture. No significant change in a configuration of that level since 2014. Adjacent levels appear  stable and intact. No other thoracic compression fracture. Overall stable thoracic vertebral height and alignment. Normal thoracic segmentation. Grossly intact visualized upper lumbar levels. Grossly stable visualized thoracic visceral contours.  IMPRESSION: 1. Chronic severe T7 compression fracture not significantly changed since 2014. 2.  No acute fracture or listhesis identified in the thoracic spine.   Electronically Signed   By: Lars Pinks M.D.   On: 02/05/2014 19:50   Ct Head Wo Contrast  02/05/2014   CLINICAL DATA:  80 year old male with fall from standing. Posterior head injury and headache with neck pain.  EXAM: CT HEAD WITHOUT CONTRAST  CT CERVICAL SPINE WITHOUT CONTRAST  TECHNIQUE: Multidetector CT imaging of the head and cervical spine was performed following the standard protocol without intravenous contrast. Multiplanar CT image reconstructions of the cervical spine were also generated.  COMPARISON:  05/13/2013.  Brain MRI 05/01/2013.  FINDINGS: CT HEAD FINDINGS  Sequelae of multiple craniotomies on the right. There is trace subcutaneous gas along the right occipital convexity, with laceration and hematoma associated. Hematoma measures up to 13 mm in thickness. Underlying right occipital bone intact. No acute osseous abnormality identified. Mildly increased paranasal sinus mucosal thickening. Mastoids and tympanic cavities are clear. Stable orbits soft tissues.  Chronic coarsely calcified right clinoid region irregular mass, best demonstrated on 05/01/2013 and felt to be meningioma at that time. Stable cerebral volume. Mild ventriculomegaly. No midline shift. No new intracranial mass effect identified. No acute intracranial hemorrhage identified. Patchy white matter hypodensity. No evidence of cortically based acute infarction identified. Stable CT appearance of the major intracranial vascular structures.  CT CERVICAL SPINE FINDINGS  Overall stable cervical vertebral height and alignment with  exaggerated lordosis. Chronic ankylosis at C2-C3. Visualized skull base is intact. No atlanto-occipital dissociation. Cervicothoracic junction alignment is within normal limits. Bilateral posterior element alignment is within normal limits. Moderate to severe chronic facet hypertrophy on the right at C3-C4. And on the left at C4-C5. No acute cervical spine fracture identified. Grossly intact visualized upper thoracic levels. Stable small bone island in the T1 spinous process.  Negative lung apices.  Negative paraspinal soft tissues.  IMPRESSION: 1.  No acute intracranial abnormality. 2. No acute fracture or listhesis identified in the cervical spine. Ligamentous injury is not excluded. 3. Postoperative changes to the skull. Chronic right supraclinoid mass, see MRI report 05/01/2013.   Electronically Signed   By: Lars Pinks M.D.   On: 02/05/2014 19:46   Ct Cervical Spine Wo Contrast  02/05/2014   CLINICAL DATA:  80 year old male with fall from standing. Posterior head injury and headache with neck pain.  EXAM: CT HEAD WITHOUT CONTRAST  CT CERVICAL SPINE WITHOUT CONTRAST  TECHNIQUE: Multidetector CT imaging of the head and cervical spine was performed following the standard protocol without intravenous contrast. Multiplanar CT image reconstructions of the cervical spine were also generated.  COMPARISON:  05/13/2013.  Brain MRI 05/01/2013.  FINDINGS: CT HEAD FINDINGS  Sequelae of multiple craniotomies on the right. There is trace subcutaneous gas along the right occipital convexity, with laceration and hematoma associated. Hematoma measures up to 13 mm in thickness. Underlying right occipital bone intact. No acute osseous abnormality identified. Mildly increased paranasal sinus mucosal thickening. Mastoids and tympanic cavities are clear. Stable orbits soft tissues.  Chronic coarsely calcified right clinoid region irregular mass, best demonstrated on 05/01/2013 and felt to be meningioma at that time. Stable cerebral  volume. Mild ventriculomegaly. No midline shift. No new intracranial mass effect identified. No acute intracranial hemorrhage identified. Patchy white matter hypodensity. No evidence of cortically based acute infarction identified. Stable CT appearance of the major intracranial vascular structures.  CT CERVICAL SPINE FINDINGS  Overall stable cervical vertebral height and alignment with exaggerated lordosis. Chronic ankylosis at C2-C3. Visualized skull base is intact. No atlanto-occipital dissociation. Cervicothoracic junction alignment is within normal limits. Bilateral posterior element alignment is within normal limits. Moderate to severe chronic facet hypertrophy on the right at C3-C4. And on the left at C4-C5. No acute cervical spine fracture identified. Grossly intact visualized upper thoracic levels. Stable small bone island in the T1  spinous process.  Negative lung apices.  Negative paraspinal soft tissues.  IMPRESSION: 1.  No acute intracranial abnormality. 2. No acute fracture or listhesis identified in the cervical spine. Ligamentous injury is not excluded. 3. Postoperative changes to the skull. Chronic right supraclinoid mass, see MRI report 05/01/2013.   Electronically Signed   By: Lars Pinks M.D.   On: 02/05/2014 19:46   Assessment/Plan: 80 year old man with a history of previous hemorrhagic stroke as well as hypertension and hyperlipidemia and seizure disorder presenting with recurrent breakthrough grand mal seizure.  Recommendations: 1. Agree with obtaining MRI of the brain to rule out recurrent stroke. Stroke workup with risk assessment if MRI is positive for acute infarction. 2. Increase Keppra to 1,250 mg twice a day. 3. Followup with outpatient neurologist following discharge. 4. Aspirin 81 mg per day.  C.R. Nicole Kindred, MD [facility] [phone]  02/06/2014, 12:16 PM

## 2014-02-06 NOTE — Evaluation (Signed)
Physical Therapy Evaluation Patient Details Name: Gerald Leach MRN: [id number] DOB: 10-29-1933 Today's Date: 02/06/2014   History of Present Illness  Pt adm with syncope likely due to seizure. PMH - Rt craniotomy in 2014 due to aneurysm rupture, seizures  Clinical Impression  Pt admitted with above. Pt currently with functional limitations due to the deficits listed below (see PT Problem List).  Pt will benefit from skilled PT to increase their independence and safety with mobility. Currently pt needs 24 hour assist due to very high fall risk due to poor balance and decr cognitive awareness. Wife unable to physically assist. Recommend ST-SNF unless family able to provide 24 hour care by physically able caregiver.     Follow Up Recommendations SNF    Equipment Recommendations  None recommended by PT    Recommendations for Other Services       Precautions / Restrictions Precautions Precautions: Fall      Mobility  Bed Mobility                  Transfers Overall transfer level: Needs assistance Equipment used: None;1 person hand held assist;Rolling walker (2 wheeled) Transfers: Sit to/from Stand Sit to Stand: Min guard         General transfer comment: For balance and safety  Ambulation/Gait Ambulation/Gait assistance: Min assist Ambulation Distance (Feet): 200 Feet (x 2) Assistive device: 4-wheeled walker;1 person hand held assist Gait Pattern/deviations: Step-through pattern;Decreased step length - left;Decreased dorsiflexion - left Gait velocity: decr Gait velocity interpretation: Below normal speed for age/gender General Gait Details: Pt unsteady on feet with min A required to maintain balance. Using rolling walker resulted in some improvement but pt still requiring min A for balance.  Stairs            Wheelchair Mobility    Modified Rankin (Stroke Patients Only)       Balance Overall balance assessment: Needs assistance Sitting-balance  support: No upper extremity supported;Feet supported Sitting balance-Leahy Scale: Good     Standing balance support: Single extremity supported Standing balance-Leahy Scale: Poor                               Pertinent Vitals/Pain Pain Assessment: No/denies pain    Home Living Family/patient expects to be discharged to:: Private residence Living Arrangements: Spouse/significant other Available Help at Discharge: Family (wife unable to assist due to debility and medical issues) Type of Home: House Home Access: Level entry     Home Layout: Multi-level Home Equipment: Environmental consultant - 2 wheels;Cane - single point      Prior Function Level of Independence: Independent with assistive device(s)         Comments: Uses cane at all times.     Hand Dominance   Dominant Hand: Right    Extremity/Trunk Assessment   Upper Extremity Assessment: Overall WFL for tasks assessed           Lower Extremity Assessment: LLE deficits/detail;Generalized weakness   LLE Deficits / Details: Pt dragging LLE with amb due to weak dorsiflexion.     Communication   Communication: No difficulties  Cognition Arousal/Alertness: Awake/alert Behavior During Therapy: Impulsive Overall Cognitive Status: Impaired/Different from baseline Area of Impairment: Memory;Safety/judgement     Memory: Decreased short-term memory;Decreased recall of precautions   Safety/Judgement: Decreased awareness of safety;Decreased awareness of deficits     General Comments: Pt told not to stand unassisted while I went to get walker. Bed  alarm set. Pt standing up within 20 seconds. Again instructed to stay seated. Again initiating standing.    General Comments      Exercises        Assessment/Plan    PT Assessment Patient needs continued PT services  PT Diagnosis Difficulty walking;Abnormality of gait;Altered mental status   PT Problem List Decreased strength;Decreased balance;Decreased  mobility;Decreased safety awareness;Decreased knowledge of precautions;Decreased cognition  PT Treatment Interventions DME instruction;Gait training;Functional mobility training;Therapeutic activities;Balance training;Therapeutic exercise;Cognitive remediation;Patient/family education   PT Goals (Current goals can be found in the Care Plan section) Acute Rehab PT Goals Patient Stated Goal: Go home PT Goal Formulation: With patient Time For Goal Achievement: 02/13/14 Potential to Achieve Goals: Good    Frequency Min 3X/week   Barriers to discharge Decreased caregiver support wife unable to provide physical assist    Co-evaluation               End of Session Equipment Utilized During Treatment: Other (comment) (belt on pt's shorts) Activity Tolerance: Patient tolerated treatment well Patient left: in bed;with call bell/phone within reach;with bed alarm set Nurse Communication: Mobility status;Other (comment) (Need for bed/chair alarm)         Time: 6433-2951 PT Time Calculation (min): 16 min   Charges:   PT Evaluation $Initial PT Evaluation Tier I: 1 Procedure PT Treatments $Gait Training: 8-22 mins   PT G Codes:          Rodderick Holtzer March 05, 2014, 1:59 PM  [facility] [phone]

## 2014-02-07 ENCOUNTER — Observation Stay (HOSPITAL_COMMUNITY): Payer: Medicare Other

## 2014-02-07 DIAGNOSIS — E039 Hypothyroidism, unspecified: Secondary | ICD-10-CM

## 2014-02-07 LAB — GLUCOSE, CAPILLARY
GLUCOSE-CAPILLARY: 107 mg/dL — AB (ref 70–99)
GLUCOSE-CAPILLARY: 150 mg/dL — AB (ref 70–99)
Glucose-Capillary: 128 mg/dL — ABNORMAL HIGH (ref 70–99)

## 2014-02-07 NOTE — Progress Notes (Signed)
PROGRESS NOTE  Gerald Leach MVH:[id number] DOB: [birth year] DOA: 02/05/2014 PCP: [name], MD  Assessment/Plan: Syncope - strongly suspect patient's LOC and fall at home is secondary to a seizure given the tongue biting and his extensive history of the same Discussed no driving with patient -PT eval -MRI  -keppra increased by neuro  AMS/dementia -unsteady on feet at home per son -PT eval- SNF vs 24 hour care at home -haldol PRN  V.Tach on monitor in ED. He is asymptomatic from a cardiac standpoint. tele monitor - seen by Dr. Kennith Center- was not true V tac  Seizure disorder -  -keppra increased by neuro   S/p craniotomy  DM2 - continue home metformin  -CBG AC/HS   Code Status: full Family Communication: son on phone yesterday Disposition Plan:    Consultants:  neuro  Procedures:     HPI/Subjective: In bed No new c/o Still impulsive  Objective: Filed Vitals:   02/06/14 2039  BP: 139/88  Pulse: 71  Temp: 98.5 F (36.9 C)  Resp: 18    Intake/Output Summary (Last 24 hours) at 02/07/14 0832 Last data filed at 02/06/14 1700  Gross per 24 hour  Intake    843 ml  Output    100 ml  Net    743 ml   Filed Weights   02/05/14 1816 02/06/14 0009  Weight: 82.555 kg (182 lb) 83.9 kg (184 lb 15.5 oz)    Exam:   General:  Pleasant/cooperative  Cardiovascular: rrr  Respiratory: clear  Abdomen: +Bs, soft  Musculoskeletal: moves all 4 ext   Data Reviewed: Basic Metabolic Panel:  Recent Labs Lab 02/05/14 1845 02/06/14 0429  NA 137 137  K 4.0 3.9  CL 100 101  CO2 24 23  GLUCOSE 112* 108*  BUN 11 10  CREATININE 0.79 0.74  CALCIUM 8.6 8.7   Liver Function Tests: No results found for this basename: AST, ALT, ALKPHOS, BILITOT, PROT, ALBUMIN,  in the last 168 hours No results found for this basename: LIPASE, AMYLASE,  in the last 168 hours No results found for this basename: AMMONIA,  in the last 168 hours CBC:  Recent Labs Lab  02/05/14 1845 02/06/14 0429  WBC 12.2* 9.2  NEUTROABS 9.1*  --   HGB 12.8* 12.1*  HCT 39.1 36.5*  MCV 80.8 79.7  PLT 208 204   Cardiac Enzymes: No results found for this basename: CKTOTAL, CKMB, CKMBINDEX, TROPONINI,  in the last 168 hours BNP (last 3 results) No results found for this basename: PROBNP,  in the last 8760 hours CBG:  Recent Labs Lab 02/06/14 0018 02/06/14 0604 02/06/14 1122 02/06/14 1630 02/06/14 2051  GLUCAP 181* 120* 124* 131* 114*    No results found for this or any previous visit (from the past 240 hour(s)).   Studies: Dg Chest 1 View  02/05/2014   CLINICAL DATA:  Syncope and tachycardia  EXAM: CHEST - 1 VIEW  COMPARISON:  September 27, 2012  FINDINGS: There is no edema or consolidation. Heart is borderline enlarged with pulmonary vascularity within normal limits. No pneumothorax. No adenopathy. There is old rib trauma on the right.  IMPRESSION: No edema or consolidation.  Heart borderline enlarged.   Electronically Signed   By: Lowella Grip M.D.   On: 02/05/2014 19:48   Dg Thoracic Spine 2 View  02/05/2014   CLINICAL DATA:  80 year old male status post fall with upper back pain. Initial encounter.  EXAM: THORACIC SPINE - 2 VIEW  COMPARISON:  [facility]  thoracic spine radiographs 10/20/2012 and earlier, thoracic spine CT myelogram 05/02/2012.  FINDINGS: Chronic severe T7 compression fracture. No significant change in a configuration of that level since 2014. Adjacent levels appear stable and intact. No other thoracic compression fracture. Overall stable thoracic vertebral height and alignment. Normal thoracic segmentation. Grossly intact visualized upper lumbar levels. Grossly stable visualized thoracic visceral contours.  IMPRESSION: 1. Chronic severe T7 compression fracture not significantly changed since 2014. 2.  No acute fracture or listhesis identified in the thoracic spine.   Electronically Signed   By: Lars Pinks M.D.   On: 02/05/2014 19:50   Ct  Head Wo Contrast  02/05/2014   CLINICAL DATA:  80 year old male with fall from standing. Posterior head injury and headache with neck pain.  EXAM: CT HEAD WITHOUT CONTRAST  CT CERVICAL SPINE WITHOUT CONTRAST  TECHNIQUE: Multidetector CT imaging of the head and cervical spine was performed following the standard protocol without intravenous contrast. Multiplanar CT image reconstructions of the cervical spine were also generated.  COMPARISON:  05/13/2013.  Brain MRI 05/01/2013.  FINDINGS: CT HEAD FINDINGS  Sequelae of multiple craniotomies on the right. There is trace subcutaneous gas along the right occipital convexity, with laceration and hematoma associated. Hematoma measures up to 13 mm in thickness. Underlying right occipital bone intact. No acute osseous abnormality identified. Mildly increased paranasal sinus mucosal thickening. Mastoids and tympanic cavities are clear. Stable orbits soft tissues.  Chronic coarsely calcified right clinoid region irregular mass, best demonstrated on 05/01/2013 and felt to be meningioma at that time. Stable cerebral volume. Mild ventriculomegaly. No midline shift. No new intracranial mass effect identified. No acute intracranial hemorrhage identified. Patchy white matter hypodensity. No evidence of cortically based acute infarction identified. Stable CT appearance of the major intracranial vascular structures.  CT CERVICAL SPINE FINDINGS  Overall stable cervical vertebral height and alignment with exaggerated lordosis. Chronic ankylosis at C2-C3. Visualized skull base is intact. No atlanto-occipital dissociation. Cervicothoracic junction alignment is within normal limits. Bilateral posterior element alignment is within normal limits. Moderate to severe chronic facet hypertrophy on the right at C3-C4. And on the left at C4-C5. No acute cervical spine fracture identified. Grossly intact visualized upper thoracic levels. Stable small bone island in the T1 spinous process.  Negative  lung apices.  Negative paraspinal soft tissues.  IMPRESSION: 1.  No acute intracranial abnormality. 2. No acute fracture or listhesis identified in the cervical spine. Ligamentous injury is not excluded. 3. Postoperative changes to the skull. Chronic right supraclinoid mass, see MRI report 05/01/2013.   Electronically Signed   By: Lars Pinks M.D.   On: 02/05/2014 19:46   Ct Cervical Spine Wo Contrast  02/05/2014   CLINICAL DATA:  80 year old male with fall from standing. Posterior head injury and headache with neck pain.  EXAM: CT HEAD WITHOUT CONTRAST  CT CERVICAL SPINE WITHOUT CONTRAST  TECHNIQUE: Multidetector CT imaging of the head and cervical spine was performed following the standard protocol without intravenous contrast. Multiplanar CT image reconstructions of the cervical spine were also generated.  COMPARISON:  05/13/2013.  Brain MRI 05/01/2013.  FINDINGS: CT HEAD FINDINGS  Sequelae of multiple craniotomies on the right. There is trace subcutaneous gas along the right occipital convexity, with laceration and hematoma associated. Hematoma measures up to 13 mm in thickness. Underlying right occipital bone intact. No acute osseous abnormality identified. Mildly increased paranasal sinus mucosal thickening. Mastoids and tympanic cavities are clear. Stable orbits soft tissues.  Chronic coarsely calcified right clinoid region irregular mass,  best demonstrated on 05/01/2013 and felt to be meningioma at that time. Stable cerebral volume. Mild ventriculomegaly. No midline shift. No new intracranial mass effect identified. No acute intracranial hemorrhage identified. Patchy white matter hypodensity. No evidence of cortically based acute infarction identified. Stable CT appearance of the major intracranial vascular structures.  CT CERVICAL SPINE FINDINGS  Overall stable cervical vertebral height and alignment with exaggerated lordosis. Chronic ankylosis at C2-C3. Visualized skull base is intact. No  atlanto-occipital dissociation. Cervicothoracic junction alignment is within normal limits. Bilateral posterior element alignment is within normal limits. Moderate to severe chronic facet hypertrophy on the right at C3-C4. And on the left at C4-C5. No acute cervical spine fracture identified. Grossly intact visualized upper thoracic levels. Stable small bone island in the T1 spinous process.  Negative lung apices.  Negative paraspinal soft tissues.  IMPRESSION: 1.  No acute intracranial abnormality. 2. No acute fracture or listhesis identified in the cervical spine. Ligamentous injury is not excluded. 3. Postoperative changes to the skull. Chronic right supraclinoid mass, see MRI report 05/01/2013.   Electronically Signed   By: Lars Pinks M.D.   On: 02/05/2014 19:46    Scheduled Meds: . levETIRAcetam  1,000 mg Oral BID  . levothyroxine  50 mcg Oral QAC breakfast  . metFORMIN  500 mg Oral BID WC  . multivitamin with minerals  1 tablet Oral Daily  . pantoprazole  40 mg Oral Daily  . simvastatin  10 mg Oral QHS  . sodium chloride  3 mL Intravenous Q12H   Continuous Infusions:  Antibiotics Given (last 72 hours)   None      Principal Problem:   Syncope Active Problems:   Diabetes mellitus type II, uncontrolled   Seizure disorder    Time spent: 25 min    Alazne Quant  [facility] Pager [phone]. If 7PM-7AM, please contact night-coverage at [email or web address], password [facility] 02/07/2014, 8:32 AM  LOS: 2 days

## 2014-02-07 NOTE — Progress Notes (Signed)
Utilization Review Completed.   Tinisha Etzkorn, RN, BSN Nurse Case Manager  

## 2014-02-08 LAB — GLUCOSE, CAPILLARY
GLUCOSE-CAPILLARY: 125 mg/dL — AB (ref 70–99)
Glucose-Capillary: 132 mg/dL — ABNORMAL HIGH (ref 70–99)
Glucose-Capillary: 126 mg/dL — ABNORMAL HIGH (ref 70–99)
Glucose-Capillary: 110 mg/dL — ABNORMAL HIGH (ref 70–99)

## 2014-02-08 NOTE — Clinical Social Work Psychosocial (Signed)
Clinical Social Work Department BRIEF PSYCHOSOCIAL ASSESSMENT 02/08/2014  Patient:  Gerald Leach, Gerald Leach     Account Number:  [id number]     Admit date:  02/05/2014  Clinical Social Worker:  Marciano Sequin  Date/Time:  02/08/2014 03:30 PM  Referred by:  Physician  Date Referred:  02/08/2014 Referred for  SNF Placement   Other Referral:   Interview type:  Patient Other interview type:    PSYCHOSOCIAL DATA Living Status:  WIFE Admitted from facility:   Level of care:  [place] Primary support name:  Matthieu, Lo[place]us [phone] Primary support relationship to patient:  SPOUSE Degree of support available:   Good    CURRENT CONCERNS Current Concerns  Post-Acute Placement   Other Concerns:    SOCIAL WORK ASSESSMENT / PLAN CSW met with patient at bedside. CSW introduced self and explained role. CSW explained PT recommendation for rehab. CSW explained SNF placement process. CSW discussed d/c plan.  CSW discussed benefits of SNF placement to include increased/improved strength and mobility. Patient reported that he would like to call his wife and daughter to dicsuss rehab.   Assessment/plan status:  Information/Referral to [employer] Other assessment/ plan:   CSW to complete FL2 for SNF placement.   Information/referral to community resources:   CSW provided patient community SNF list.    PATIENT'S/FAMILY'S RESPONSE TO PLAN OF CARE: The patient reported that he would like to go home. Insead of SNF.    [place], MSW, [place]

## 2014-02-08 NOTE — Clinical Social Work Placement (Addendum)
Clinical Social Work Department CLINICAL SOCIAL WORK PLACEMENT NOTE 02/08/2014  Patient:  Gerald Leach, Gerald Leach  Account Number:  [id number] Admit date:  02/05/2014  Clinical Social Worker:  Paulette Blanch BIBBS, LCSWA  Date/time:  02/08/2014 3:44pm   Clinical Social Work is seeking post-discharge placement for this patient at the following level of care:   SKILLED NURSING   (*CSW will update this form in Epic as items are completed)   02/08/2014  Patient/family provided with [place] Department of Clinical Social Work's list of facilities offering this level of care within the geographic area requested by the patient (or if unable, by the patient's family).  02/08/2014  Patient/family informed of their freedom to choose among providers that offer the needed level of care, that participate in Medicare, Medicaid or managed care program needed by the patient, have an available bed and are willing to accept the patient.  02/08/2014  Patient/family informed of [facility]' ownership interest in [facility], as well as of the fact that they are under no obligation to receive care at this facility.  PASARR submitted to EDS on 05/18/2013 PASARR number received on 05/18/2013  FL2 transmitted to all facilities in geographic area requested by pt/family on  02/08/2014 FL2 transmitted to all facilities within larger geographic area on 02/08/2014  Patient informed that his/her managed care company has contracts with or will negotiate with  certain facilities, including the following:     Patient/family informed of bed offers received:  02/09/2014  Patient chooses bed at  Physician recommends and patient chooses bed at    Patient to be transferred to  on   Patient to be transferred to facility by  Patient and family notified of transfer on  Name of family member notified:    The following physician request were entered in Epic:   Additional Comments: Existing PASSAR [id number]  Patient  and family have agreed that patient will return home with his wife and home health following - CM to arrange prior to discharge.   [place], MSW, [place]

## 2014-02-08 NOTE — Progress Notes (Signed)
PROGRESS NOTE  Gerald Leach PYP:[id number] DOB: 1934-04-04 DOA: 02/05/2014 PCP: [name], MD  Assessment/Plan: Syncope - strongly suspect patient's LOC and fall at home is secondary to a seizure given the tongue biting and his extensive history of the same Discussed no driving with patient -PT eval -MRI discussed with Dr. Nicole Kindred, no further work up -keppra increased by neuro- patient had no further episodes  AMS/dementia -unsteady on feet at home per son -PT eval- SNF vs 24 hour care at home- discussed with daughter -haldol PRN  V.Tach on monitor in ED. He is asymptomatic from a cardiac standpoint. tele monitor - seen by Dr. Kennith Center- was not true V tac  Seizure disorder -  -keppra increased by neuro   S/p craniotomy  DM2 - continue home metformin  -CBG AC/HS   Code Status: full Family Communication: daughter and wife Disposition Plan: SNF   Consultants:  neuro  Procedures:     HPI/Subjective: Feeling better No SOB, no CP  Objective: Filed Vitals:   02/08/14 0408  BP: 131/82  Pulse: 70  Temp: 97.5 F (36.4 C)  Resp: 18    Intake/Output Summary (Last 24 hours) at 02/08/14 1014 Last data filed at 02/08/14 0700  Gross per 24 hour  Intake    460 ml  Output    100 ml  Net    360 ml   Filed Weights   02/05/14 1816 02/06/14 0009  Weight: 82.555 kg (182 lb) 83.9 kg (184 lb 15.5 oz)    Exam:   General:  Pleasant/cooperative  Cardiovascular: rrr  Respiratory: clear  Abdomen: +Bs, soft  Musculoskeletal: moves all 4 ext   Data Reviewed: Basic Metabolic Panel:  Recent Labs Lab 02/05/14 1845 02/06/14 0429  NA 137 137  K 4.0 3.9  CL 100 101  CO2 24 23  GLUCOSE 112* 108*  BUN 11 10  CREATININE 0.79 0.74  CALCIUM 8.6 8.7   Liver Function Tests: No results found for this basename: AST, ALT, ALKPHOS, BILITOT, PROT, ALBUMIN,  in the last 168 hours No results found for this basename: LIPASE, AMYLASE,  in the last 168 hours No  results found for this basename: AMMONIA,  in the last 168 hours CBC:  Recent Labs Lab 02/05/14 1845 02/06/14 0429  WBC 12.2* 9.2  NEUTROABS 9.1*  --   HGB 12.8* 12.1*  HCT 39.1 36.5*  MCV 80.8 79.7  PLT 208 204   Cardiac Enzymes: No results found for this basename: CKTOTAL, CKMB, CKMBINDEX, TROPONINI,  in the last 168 hours BNP (last 3 results) No results found for this basename: PROBNP,  in the last 8760 hours CBG:  Recent Labs Lab 02/06/14 2051 02/07/14 0600 02/07/14 1153 02/07/14 2050 02/08/14 0705  GLUCAP 114* 128* 107* 150* 132*    No results found for this or any previous visit (from the past 240 hour(s)).   Studies: Mr Herby Abraham Contrast  02/07/2014   CLINICAL DATA:  Unsteady gait. Question CVA. Fall with head injury 02/05/2014  EXAM: MRI HEAD WITHOUT CONTRAST  TECHNIQUE: Multiplanar, multiecho pulse sequences of the brain and surrounding structures were obtained without intravenous contrast.  COMPARISON:  CT head 02/05/2014, MRI 05/01/2013  FINDINGS: Right occipital scalp contusion with staples from laceration. Chronic right parietal craniotomy. Chronic hemosiderin along the surface of the right cerebral hemisphere from prior surgery.  Negative for acute infarct.  Ventricular enlargement, question communicating hydrocephalus. There is mild atrophy however the ventricles are more dilated than expected for the level of atrophy.  This is unchanged from the prior MRI. Central atrophy also could explain this finding. There is chronic microvascular ischemic change in the white matter.  Large supra clinoid mass on the right again noted and unchanged. This measures 18 x 28 mm on axial images. This mass shows extensive peripheral calcification on CT and has areas of mixed increased and decreased signal on T1 suggesting methemoglobin and chronic blood products. 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 There appears to have  been prior occlusion of the right internal carotid artery with intraluminal endovascular device which is noted in the proximal cavernous carotid on the right on CT. I cannot exclude residual flow in this presumed aneurysm. There was some hyperintense signal within this lesion on prior MRA. I would suggest CTA of the head to evaluate for residual flow and to confirm that this is an aneurysm. Correlation with prior history would be helpful to confirm this diagnosis.  IMPRESSION: Negative for acute infarct.  Ventricular enlargement which may be due to NPH versus atrophy.  Right parietal craniotomy. Chronic hemosiderin on the surface of the brain on the right due to prior surgery. Negative for subdural hematoma.  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 Prior MRI report suggested meningioma however imaging characteristic appear more common consistent with giant aneurysm. CTA of the head is suggested to evaluate for any residual flow within the aneurysm. Correlate with prior medical history to confirm.   Electronically Signed   By: Franchot Gallo M.D.   On: 02/07/2014 19:45    Scheduled Meds: . levETIRAcetam  1,000 mg Oral BID  . levothyroxine  50 mcg Oral QAC breakfast  . metFORMIN  500 mg Oral BID WC  . multivitamin with minerals  1 tablet Oral Daily  . pantoprazole  40 mg Oral Daily  . simvastatin  10 mg Oral QHS  . sodium chloride  3 mL Intravenous Q12H   Continuous Infusions:  Antibiotics Given (last 72 hours)   None      Principal Problem:   Syncope Active Problems:   Diabetes mellitus type II, uncontrolled   Seizure disorder    Time spent: 25 min    Rahi Chandonnet  [facility] Pager [phone]. If 7PM-7AM, please contact night-coverage at [email or web address], password [facility] 02/08/2014, 10:14 AM  LOS: 3 days

## 2014-02-08 NOTE — Progress Notes (Signed)
PT Late G code Entry   02/06/14 1403  PT G-Codes **NOT FOR INPATIENT CLASS**  Functional Assessment Tool Used clinical judgement  Functional Limitation Mobility: Walking and moving around  Mobility: Walking and Moving Around Current Status [phone]) CI  Mobility: Walking and Moving Around Goal Status [phone]) [place]

## 2014-02-08 NOTE — Progress Notes (Signed)
Physical Therapy Treatment Patient Details Name: Gerald Leach MRN: [id number] DOB: [birth year] Today's Date: 03/03/14    History of Present Illness Pt adm with syncope likely due to seizure. PMH - Rt craniotomy in 2014 due to aneurysm rupture, seizures    PT Comments    Pt making progress but still high fall risk and requiring assist for mobility.  Follow Up Recommendations  SNF     Equipment Recommendations  None recommended by PT    Recommendations for Other Services       Precautions / Restrictions Precautions Precautions: Fall    Mobility  Bed Mobility                  Transfers Overall transfer level: Needs assistance Equipment used: Rolling walker (2 wheeled);Straight cane Transfers: Sit to/from Stand Sit to Stand: Min assist;Min guard         General transfer comment: Assist with hips from low chair on first attempt and then min guard for balance and safety on subsequent attempts.  Ambulation/Gait Ambulation/Gait assistance: Min assist;Min guard Ambulation Distance (Feet): 200 Feet (x 3) Assistive device: Rolling walker (2 wheeled);Straight cane Gait Pattern/deviations: Step-through pattern;Decreased step length - left;Trunk flexed Gait velocity: decr Gait velocity interpretation: Below normal speed for age/gender General Gait Details: Pt with left leg dragging especially as fatigues.    Stairs            Wheelchair Mobility    Modified Rankin (Stroke Patients Only)       Balance Overall balance assessment: Needs assistance Sitting-balance support: No upper extremity supported Sitting balance-Leahy Scale: Good     Standing balance support: No upper extremity supported Standing balance-Leahy Scale: Fair Standing balance comment: Pt able to stand unsupported statically but requires assist for any dynamic balance.             High level balance activites: Side stepping;Backward walking;Turns High Level Balance Comments:  Required min A for these activities    Cognition Arousal/Alertness: Awake/alert Behavior During Therapy: WFL for tasks assessed/performed         Memory: Decreased short-term memory   Safety/Judgement: Decreased awareness of safety          Exercises      General Comments        Pertinent Vitals/Pain Pain Assessment: No/denies pain    Home Living                      Prior Function            PT Goals (current goals can now be found in the care plan section) Progress towards PT goals: Progressing toward goals    Frequency  Min 3X/week    PT Plan Current plan remains appropriate    Co-evaluation             End of Session Equipment Utilized During Treatment: Gait belt Activity Tolerance: Patient tolerated treatment well Patient left: in chair;with call bell/phone within reach;with chair alarm set;with family/visitor present     Time: 2094-7096 PT Time Calculation (min): 35 min  Charges:  $Gait Training: 23-37 mins                    G Codes:      Raykwon Hobbs 03/03/14, 1:24 PM  [employer] PT [phone]

## 2014-02-09 DIAGNOSIS — R569 Unspecified convulsions: Secondary | ICD-10-CM

## 2014-02-09 LAB — GLUCOSE, CAPILLARY
GLUCOSE-CAPILLARY: 110 mg/dL — AB (ref 70–99)
Glucose-Capillary: 112 mg/dL — ABNORMAL HIGH (ref 70–99)
Glucose-Capillary: 116 mg/dL — ABNORMAL HIGH (ref 70–99)

## 2014-02-09 MED ORDER — LEVETIRACETAM 250 MG PO TABS: 1250.0000 mg | ORAL_TABLET | Freq: Two times a day (BID) | ORAL | Status: DC

## 2014-02-09 NOTE — Discharge Summary (Addendum)
Physician Discharge Summary  Gerald Leach DHR:[id number] DOB: 1934/02/03 DOA: 02/05/2014  PCP: [name], MD  Admit date: 02/05/2014 Discharge date: 02/09/2014  Time spent: 35 minutes  Recommendations for Outpatient Follow-up:  1. No driving 2. Family refused SNF- needs 24 hour care/supervision  Discharge Diagnoses:  Principal Problem:   Syncope Active Problems:   Diabetes mellitus type II, uncontrolled   Seizure disorder   Discharge Condition: improved  Diet recommendation: cardiac/diabetic  Filed Weights   02/05/14 1816 02/06/14 0009  Weight: 82.555 kg (182 lb) 83.9 kg (184 lb 15.5 oz)    History of present illness:  Gerald Leach is a 80 y.o. male who presents to the ED after a fall occuring at home. He has a history of a seizure disorder secondary to prior [facility] with resulting enecphalomalacia and chronic L sided weakness and facial droop. I actually admitted the patient last November for seizures including a witnessed grand-mal seizure in the ED at that time.  He does not recall the events surrounding his fall today or if he had a seizure, he does believe he had LOC at that time. He did bite his tongue. He also hit his head on the ground (thinks he landed on a brick) and has a laceration to the back of his head.  Thankfully his CT head was negative for any acute intracranial trauma findings this time, last tetanus shot was in last 5 years   Hospital Course:  Syncope - strongly suspect patient's LOC and fall at home is secondary to a seizure given the tongue biting and his extensive history of the same  Discussed no driving with patient  -PT eval  -MRI discussed with Dr. Nicole Kindred, no further work up  -keppra increased by neuro- patient had no further episodes   AMS/dementia  -unsteady on feet at home per son  -to SNF  V.Tach on monitor in ED. He is asymptomatic from a cardiac standpoint.  tele monitor  - seen by Dr. Kennith Center- was not true V tac   Seizure  disorder -  -keppra increased by neuro   S/p craniotomy  DM2 - continue home metformin  -CBG AC/HS   Procedures:    Consultations:    Discharge Exam: Filed Vitals:   02/09/14 0500  BP: 112/57  Pulse: 65  Temp: 98.1 F (36.7 C)  Resp: 19    General: pleasant/cooperative Cardiovascular: rrr Respiratory: clear  Discharge Instructions You were cared for by a hospitalist during your hospital stay. If you have any questions about your discharge medications or the care you received while you were in the hospital after you are discharged, you can call the unit and asked to speak with the hospitalist on call if the hospitalist that took care of you is not available. Once you are discharged, your primary care physician will handle any further medical issues. Please note that NO REFILLS for any discharge medications will be authorized once you are discharged, as it is imperative that you return to your primary care physician (or establish a relationship with a primary care physician if you do not have one) for your aftercare needs so that they can reassess your need for medications and monitor your lab values.      Discharge Instructions   Diet - low sodium heart healthy    Complete by:  As directed      Diet Carb Modified    Complete by:  As directed      Discharge instructions  Complete by:  As directed   No driving     Increase activity slowly    Complete by:  As directed             Medication List         levETIRAcetam 250 MG tablet  Commonly known as:  KEPPRA  Take 5 tablets (1,250 mg total) by mouth 2 (two) times daily.     levothyroxine 50 MCG tablet  Commonly known as:  SYNTHROID, LEVOTHROID  Take 50 mcg by mouth daily before breakfast.     metFORMIN 500 MG tablet  Commonly known as:  GLUCOPHAGE  Take 500 mg by mouth 2 (two) times daily with a meal.     multivitamin with minerals Tabs tablet  Take 1 tablet by mouth daily.     omeprazole 20 MG capsule   Commonly known as:  PRILOSEC  Take 20 mg by mouth every Monday, Wednesday, and Friday.     ONE TOUCH LANCETS Misc  1 each by Does not apply route daily.     simvastatin 10 MG tablet  Commonly known as:  ZOCOR  Take 10 mg by mouth at bedtime.       No Known Allergies    The results of significant diagnostics from this hospitalization (including imaging, microbiology, ancillary and laboratory) are listed below for reference.    Significant Diagnostic Studies: Dg Chest 1 View  02/05/2014   CLINICAL DATA:  Syncope and tachycardia  EXAM: CHEST - 1 VIEW  COMPARISON:  September 27, 2012  FINDINGS: There is no edema or consolidation. Heart is borderline enlarged with pulmonary vascularity within normal limits. No pneumothorax. No adenopathy. There is old rib trauma on the right.  IMPRESSION: No edema or consolidation.  Heart borderline enlarged.   Electronically Signed   By: Lowella Grip M.D.   On: 02/05/2014 19:48   Dg Thoracic Spine 2 View  02/05/2014   CLINICAL DATA:  80 year old male status post fall with upper back pain. Initial encounter.  EXAM: THORACIC SPINE - 2 VIEW  COMPARISON:  [facility] thoracic spine radiographs 10/20/2012 and earlier, thoracic spine CT myelogram 05/02/2012.  FINDINGS: Chronic severe T7 compression fracture. No significant change in a configuration of that level since 2014. Adjacent levels appear stable and intact. No other thoracic compression fracture. Overall stable thoracic vertebral height and alignment. Normal thoracic segmentation. Grossly intact visualized upper lumbar levels. Grossly stable visualized thoracic visceral contours.  IMPRESSION: 1. Chronic severe T7 compression fracture not significantly changed since 2014. 2.  No acute fracture or listhesis identified in the thoracic spine.   Electronically Signed   By: Lars Pinks M.D.   On: 02/05/2014 19:50   Ct Head Wo Contrast  02/05/2014   CLINICAL DATA:  80 year old male with fall from standing.  Posterior head injury and headache with neck pain.  EXAM: CT HEAD WITHOUT CONTRAST  CT CERVICAL SPINE WITHOUT CONTRAST  TECHNIQUE: Multidetector CT imaging of the head and cervical spine was performed following the standard protocol without intravenous contrast. Multiplanar CT image reconstructions of the cervical spine were also generated.  COMPARISON:  05/13/2013.  Brain MRI 05/01/2013.  FINDINGS: CT HEAD FINDINGS  Sequelae of multiple craniotomies on the right. There is trace subcutaneous gas along the right occipital convexity, with laceration and hematoma associated. Hematoma measures up to 13 mm in thickness. Underlying right occipital bone intact. No acute osseous abnormality identified. Mildly increased paranasal sinus mucosal thickening. Mastoids and tympanic cavities are clear. Stable orbits soft  tissues.  Chronic coarsely calcified right clinoid region irregular mass, best demonstrated on 05/01/2013 and felt to be meningioma at that time. Stable cerebral volume. Mild ventriculomegaly. No midline shift. No new intracranial mass effect identified. No acute intracranial hemorrhage identified. Patchy white matter hypodensity. No evidence of cortically based acute infarction identified. Stable CT appearance of the major intracranial vascular structures.  CT CERVICAL SPINE FINDINGS  Overall stable cervical vertebral height and alignment with exaggerated lordosis. Chronic ankylosis at C2-C3. Visualized skull base is intact. No atlanto-occipital dissociation. Cervicothoracic junction alignment is within normal limits. Bilateral posterior element alignment is within normal limits. Moderate to severe chronic facet hypertrophy on the right at C3-C4. And on the left at C4-C5. No acute cervical spine fracture identified. Grossly intact visualized upper thoracic levels. Stable small bone island in the T1 spinous process.  Negative lung apices.  Negative paraspinal soft tissues.  IMPRESSION: 1.  No acute intracranial  abnormality. 2. No acute fracture or listhesis identified in the cervical spine. Ligamentous injury is not excluded. 3. Postoperative changes to the skull. Chronic right supraclinoid mass, see MRI report 05/01/2013.   Electronically Signed   By: Lars Pinks M.D.   On: 02/05/2014 19:46   Ct Cervical Spine Wo Contrast  02/05/2014   CLINICAL DATA:  80 year old male with fall from standing. Posterior head injury and headache with neck pain.  EXAM: CT HEAD WITHOUT CONTRAST  CT CERVICAL SPINE WITHOUT CONTRAST  TECHNIQUE: Multidetector CT imaging of the head and cervical spine was performed following the standard protocol without intravenous contrast. Multiplanar CT image reconstructions of the cervical spine were also generated.  COMPARISON:  05/13/2013.  Brain MRI 05/01/2013.  FINDINGS: CT HEAD FINDINGS  Sequelae of multiple craniotomies on the right. There is trace subcutaneous gas along the right occipital convexity, with laceration and hematoma associated. Hematoma measures up to 13 mm in thickness. Underlying right occipital bone intact. No acute osseous abnormality identified. Mildly increased paranasal sinus mucosal thickening. Mastoids and tympanic cavities are clear. Stable orbits soft tissues.  Chronic coarsely calcified right clinoid region irregular mass, best demonstrated on 05/01/2013 and felt to be meningioma at that time. Stable cerebral volume. Mild ventriculomegaly. No midline shift. No new intracranial mass effect identified. No acute intracranial hemorrhage identified. Patchy white matter hypodensity. No evidence of cortically based acute infarction identified. Stable CT appearance of the major intracranial vascular structures.  CT CERVICAL SPINE FINDINGS  Overall stable cervical vertebral height and alignment with exaggerated lordosis. Chronic ankylosis at C2-C3. Visualized skull base is intact. No atlanto-occipital dissociation. Cervicothoracic junction alignment is within normal limits. Bilateral  posterior element alignment is within normal limits. Moderate to severe chronic facet hypertrophy on the right at C3-C4. And on the left at C4-C5. No acute cervical spine fracture identified. Grossly intact visualized upper thoracic levels. Stable small bone island in the T1 spinous process.  Negative lung apices.  Negative paraspinal soft tissues.  IMPRESSION: 1.  No acute intracranial abnormality. 2. No acute fracture or listhesis identified in the cervical spine. Ligamentous injury is not excluded. 3. Postoperative changes to the skull. Chronic right supraclinoid mass, see MRI report 05/01/2013.   Electronically Signed   By: Lars Pinks M.D.   On: 02/05/2014 19:46   Mr Brain Wo Contrast  02/07/2014   CLINICAL DATA:  Unsteady gait. Question CVA. Fall with head injury 02/05/2014  EXAM: MRI HEAD WITHOUT CONTRAST  TECHNIQUE: Multiplanar, multiecho pulse sequences of the brain and surrounding structures were obtained without intravenous contrast.  COMPARISON:  CT head 02/05/2014, MRI 05/01/2013  FINDINGS: Right occipital scalp contusion with staples from laceration. Chronic right parietal craniotomy. Chronic hemosiderin along the surface of the right cerebral hemisphere from prior surgery.  Negative for acute infarct.  Ventricular enlargement, question communicating hydrocephalus. There is mild atrophy however the ventricles are more dilated than expected for the level of atrophy. This is unchanged from the prior MRI. Central atrophy also could explain this finding. There is chronic microvascular ischemic change in the white matter.  Large supra clinoid mass on the right again noted and unchanged. This measures 18 x 28 mm on axial images. This mass shows extensive peripheral calcification on CT and has areas of mixed increased and decreased signal on T1 suggesting methemoglobin and chronic blood products. 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 There  appears to have been prior occlusion of the right internal carotid artery with intraluminal endovascular device which is noted in the proximal cavernous carotid on the right on CT. I cannot exclude residual flow in this presumed aneurysm. There was some hyperintense signal within this lesion on prior MRA. I would suggest CTA of the head to evaluate for residual flow and to confirm that this is an aneurysm. Correlation with prior history would be helpful to confirm this diagnosis.  IMPRESSION: Negative for acute infarct.  Ventricular enlargement which may be due to NPH versus atrophy.  Right parietal craniotomy. Chronic hemosiderin on the surface of the brain on the right due to prior surgery. Negative for subdural hematoma.  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 Prior MRI report suggested meningioma however imaging characteristic appear more common consistent with giant aneurysm. CTA of the head is suggested to evaluate for any residual flow within the aneurysm. Correlate with prior medical history to confirm.   Electronically Signed   By: Franchot Gallo M.D.   On: 02/07/2014 19:45    Microbiology: No results found for this or any previous visit (from the past 240 hour(s)).   Labs: Basic Metabolic Panel:  Recent Labs Lab 02/05/14 1845 02/06/14 0429  NA 137 137  K 4.0 3.9  CL 100 101  CO2 24 23  GLUCOSE 112* 108*  BUN 11 10  CREATININE 0.79 0.74  CALCIUM 8.6 8.7   Liver Function Tests: No results found for this basename: AST, ALT, ALKPHOS, BILITOT, PROT, ALBUMIN,  in the last 168 hours No results found for this basename: LIPASE, AMYLASE,  in the last 168 hours No results found for this basename: AMMONIA,  in the last 168 hours CBC:  Recent Labs Lab 02/05/14 1845 02/06/14 0429  WBC 12.2* 9.2  NEUTROABS 9.1*  --   HGB 12.8* 12.1*  HCT 39.1 36.5*  MCV 80.8 79.7  PLT 208 204   Cardiac Enzymes: No results found for this  basename: CKTOTAL, CKMB, CKMBINDEX, TROPONINI,  in the last 168 hours BNP: BNP (last 3 results) No results found for this basename: PROBNP,  in the last 8760 hours CBG:  Recent Labs Lab 02/08/14 0705 02/08/14 1136 02/08/14 1705 02/08/14 2122 02/09/14 0630  GLUCAP 132* 126* 125* 110* 110*       Signed:  Xaden Kaufman  [facility] 02/09/2014, 10:53 AM

## 2014-02-09 NOTE — Care Management Note (Signed)
    Page 1 of 2   02/09/2014     4:13:52 PM CARE MANAGEMENT NOTE 02/09/2014  Patient:  ANAND, TEJADA   Account Number:  [id number]  Date Initiated:  02/09/2014  Documentation initiated by:  Mercy Leppla  Subjective/Objective Assessment:   Pt adm on 02/05/14 with fall, ? seizure, head lac.  PTA, pt resides at home with spouse.     Action/Plan:   PT recommending SNF at dc, but pt desires to go home with [facility] care.  CSW spoke with pt's son, and they request [facility] for [facility] needs.   Anticipated DC Date:  02/09/2014   Anticipated DC Plan:  [place]  In-house referral  Clinical Social Worker      DC Planning Services  CM consult      [facility] Choice  HOME HEALTH   Choice offered to / List presented to:  C-1 Patient        [place] arranged  HH-1 RN  [place].   Status of service:  Completed, signed off Medicare Important Message given?   (If response is "NO", the following Medicare IM given date fields will be blank) Date Medicare IM given:   Medicare IM given by:   Date Additional Medicare IM given:   Additional Medicare IM given by:    Discharge Disposition:  [place]  Per UR Regulation:  Reviewed for med. necessity/level of care/duration of stay  If discussed at [place] of Stay Meetings, dates discussed:    Comments:  02/09/14 Ellan Lambert, RN, BSN [phone] Referral to [facility] for [facility] follow up.  Per conversation with family, pt has all needed DME at home.

## 2014-02-09 NOTE — Clinical Social Work Note (Signed)
Clinical Social Worker continuing to follow patient and family for support and discharge planning needs.  CSW spoke with patient at bedside and patient son, Oliverio Cho, over the phone per patient request.  Patient is currently refusing placement, stating that he has been to [facility] in the past and will not return to a facility similar to it.  Patient son is aware of patient refusal and communicated directly with patient regarding plans.  Patient and patient son have agreed on patient return home with home health following.  Patient and patient son agreeable to [place] and having a SW present for a safety evaluation of the home.  CM updated and home health ordered.   Patient son plans to arrive around 18:00 to drive patient home with his wife.  RN updated.    Clinical Social Worker will sign off for now as social work intervention is no longer needed. Please consult [place] again if new need arises.  Barbette Or, [place]

## 2014-02-09 NOTE — Progress Notes (Signed)
Went over discharge instructions with the patient and his son. Reinforced 24 hour supervision. Patient given paper prescriptions. Iv d/c'd, patient taken off the cardiac monitor. Patient discharged home. Home health services to follow up. Roxan Hockey, RN

## 2014-02-16 ENCOUNTER — Ambulatory Visit (INDEPENDENT_AMBULATORY_CARE_PROVIDER_SITE_OTHER): Payer: Medicare Other | Admitting: Family Medicine

## 2014-02-16 ENCOUNTER — Encounter: Payer: Self-pay | Admitting: Family Medicine

## 2014-02-16 VITALS — BP 110/80 | Temp 98.3°F

## 2014-02-16 DIAGNOSIS — Z4802 Encounter for removal of sutures: Secondary | ICD-10-CM

## 2014-02-16 DIAGNOSIS — Z23 Encounter for immunization: Secondary | ICD-10-CM

## 2014-02-16 NOTE — Patient Instructions (Signed)
Return when necessary 

## 2014-02-16 NOTE — Progress Notes (Signed)
   Subjective:    [name], male    DOB: 09/11/1933, 80 y.o.   MRN: [id number]  HPI  Gerald Leach is a 80 year old male who comes in today for staple removal  He fell at home and sustained a laceration to the posterior portion of the skull. He with the emergency room. Evaluation was otherwise negative. C5 staples were used to close the laceration  Review of Systems    review of systems negative Objective:   Physical Exam  Well-developed well-nourished male no acute distress vital signs stable he is afebrile  5 Staples were removed without complications      Assessment & Plan:  Status post laceration......... staple removal..... return when necessary

## 2014-02-23 DIAGNOSIS — E1165 Type 2 diabetes mellitus with hyperglycemia: Secondary | ICD-10-CM

## 2014-02-23 DIAGNOSIS — G40309 Generalized idiopathic epilepsy and epileptic syndromes, not intractable, without status epilepticus: Secondary | ICD-10-CM

## 2014-02-23 DIAGNOSIS — IMO0001 Reserved for inherently not codable concepts without codable children: Secondary | ICD-10-CM

## 2014-02-23 DIAGNOSIS — R55 Syncope and collapse: Secondary | ICD-10-CM

## 2014-03-08 ENCOUNTER — Other Ambulatory Visit: Payer: Self-pay | Admitting: Family Medicine

## 2014-03-25 ENCOUNTER — Other Ambulatory Visit (INDEPENDENT_AMBULATORY_CARE_PROVIDER_SITE_OTHER): Payer: Medicare Other

## 2014-03-25 DIAGNOSIS — E039 Hypothyroidism, unspecified: Secondary | ICD-10-CM | POA: Diagnosis not present

## 2014-03-25 DIAGNOSIS — I10 Essential (primary) hypertension: Secondary | ICD-10-CM | POA: Diagnosis not present

## 2014-03-25 DIAGNOSIS — E785 Hyperlipidemia, unspecified: Secondary | ICD-10-CM

## 2014-03-25 DIAGNOSIS — Z125 Encounter for screening for malignant neoplasm of prostate: Secondary | ICD-10-CM | POA: Diagnosis not present

## 2014-03-25 DIAGNOSIS — Z0000 Encounter for general adult medical examination without abnormal findings: Secondary | ICD-10-CM

## 2014-03-25 LAB — MICROALBUMIN / CREATININE URINE RATIO
CREATININE, U: 223.7 mg/dL
Microalb Creat Ratio: 0.4 mg/g (ref 0.0–30.0)
Microalb, Ur: 0.8 mg/dL (ref 0.0–1.9)

## 2014-03-25 LAB — BASIC METABOLIC PANEL
BUN: 13 mg/dL (ref 6–23)
CO2: 25 mEq/L (ref 19–32)
Calcium: 9.1 mg/dL (ref 8.4–10.5)
Chloride: 102 mEq/L (ref 96–112)
Creatinine, Ser: 0.8 mg/dL (ref 0.4–1.5)
GFR: 93.82 mL/min (ref >60.00)
Glucose, Bld: 107 mg/dL — ABNORMAL HIGH (ref 70–99)
POTASSIUM: 4.4 meq/L (ref 3.5–5.1)
SODIUM: 138 meq/L (ref 135–145)

## 2014-03-25 LAB — LIPID PANEL
Cholesterol: 139 mg/dL (ref 0–200)
HDL: 28.8 mg/dL — ABNORMAL LOW (ref >39.00)
LDL Cholesterol: 74 mg/dL (ref 0–99)
NonHDL: 110.2
TRIGLYCERIDES: 182 mg/dL — AB (ref 0.0–149.0)
Total CHOL/HDL Ratio: 5
VLDL: 36.4 mg/dL (ref 0.0–40.0)

## 2014-03-25 LAB — CBC WITH DIFFERENTIAL/PLATELET
BASOS PCT: 0.5 % (ref 0.0–3.0)
Basophils Absolute: 0 10*3/uL (ref 0.0–0.1)
EOS PCT: 1 % (ref 0.0–5.0)
Eosinophils Absolute: 0.1 10*3/uL (ref 0.0–0.7)
HCT: 38.8 % — ABNORMAL LOW (ref 39.0–52.0)
Hemoglobin: 12.6 g/dL — ABNORMAL LOW (ref 13.0–17.0)
Lymphocytes Relative: 32.2 % (ref 12.0–46.0)
Lymphs Abs: 2.6 10*3/uL (ref 0.7–4.0)
MCHC: 32.4 g/dL (ref 30.0–36.0)
MCV: 78.3 fl (ref 78.0–100.0)
MONO ABS: 0.9 10*3/uL (ref 0.1–1.0)
MONOS PCT: 10.7 % (ref 3.0–12.0)
NEUTROS PCT: 55.6 % (ref 43.0–77.0)
Neutro Abs: 4.5 10*3/uL (ref 1.4–7.7)
Platelets: 243 10*3/uL (ref 150.0–400.0)
RBC: 4.95 Mil/uL (ref 4.22–5.81)
RDW: 14.6 % (ref 11.5–15.5)
WBC: 8 10*3/uL (ref 4.0–10.5)

## 2014-03-25 LAB — POCT URINALYSIS DIPSTICK
Bilirubin, UA: NEGATIVE
GLUCOSE UA: NEGATIVE
Leukocytes, UA: NEGATIVE
Nitrite, UA: NEGATIVE
RBC UA: NEGATIVE
Spec Grav, UA: 1.02
UROBILINOGEN UA: 0.2
pH, UA: 5.5

## 2014-03-25 LAB — TSH: TSH: 4.46 u[IU]/mL (ref 0.35–4.50)

## 2014-03-25 LAB — PSA: PSA: 0.53 ng/mL (ref 0.10–4.00)

## 2014-03-25 LAB — HEPATIC FUNCTION PANEL
ALT: 14 U/L (ref 0–53)
AST: 19 U/L (ref 0–37)
Albumin: 3.4 g/dL — ABNORMAL LOW (ref 3.5–5.2)
Alkaline Phosphatase: 69 U/L (ref 39–117)
BILIRUBIN DIRECT: 0.2 mg/dL (ref 0.0–0.3)
TOTAL PROTEIN: 7.1 g/dL (ref 6.0–8.3)
Total Bilirubin: 0.8 mg/dL (ref 0.2–1.2)

## 2014-03-25 LAB — HEMOGLOBIN A1C: Hgb A1c MFr Bld: 6.3 % (ref 4.6–6.5)

## 2014-03-29 ENCOUNTER — Encounter: Payer: Self-pay | Admitting: Internal Medicine

## 2014-03-31 ENCOUNTER — Other Ambulatory Visit: Payer: Self-pay | Admitting: Family Medicine

## 2014-04-01 ENCOUNTER — Encounter: Payer: Medicare Other | Admitting: Family Medicine

## 2014-04-04 ENCOUNTER — Other Ambulatory Visit: Payer: Self-pay | Admitting: Family Medicine

## 2014-04-05 ENCOUNTER — Encounter: Payer: Self-pay | Admitting: *Deleted

## 2014-04-05 NOTE — Telephone Encounter (Signed)
Patient called with appointment time and date and letter sent.  Dec. 1 at 8 am.

## 2014-04-05 NOTE — Telephone Encounter (Signed)
Do you mind calling to confirming the dose of his seizure medication as Keppra 1250mg  twice daily?  I rec'd a refill request, but I have not seen him in almost 1 year and he needs to set up return visit since the medication has been adjusted.    Camry Robello K. Posey Pronto, DO

## 2014-04-14 ENCOUNTER — Ambulatory Visit (INDEPENDENT_AMBULATORY_CARE_PROVIDER_SITE_OTHER): Payer: Medicare Other | Admitting: Family Medicine

## 2014-04-14 ENCOUNTER — Encounter: Payer: Self-pay | Admitting: Family Medicine

## 2014-04-14 VITALS — BP 122/86 | Temp 97.7°F | Ht 69.5 in | Wt 190.8 lb

## 2014-04-14 DIAGNOSIS — K219 Gastro-esophageal reflux disease without esophagitis: Secondary | ICD-10-CM

## 2014-04-14 DIAGNOSIS — E038 Other specified hypothyroidism: Secondary | ICD-10-CM

## 2014-04-14 DIAGNOSIS — E783 Hyperchylomicronemia: Secondary | ICD-10-CM

## 2014-04-14 MED ORDER — SIMVASTATIN 10 MG PO TABS
10.0000 mg | ORAL_TABLET | Freq: Every day | ORAL | Status: DC
Start: 2014-04-14 — End: 2015-06-04

## 2014-04-14 MED ORDER — OMEPRAZOLE 20 MG PO CPDR: 20.0000 mg | DELAYED_RELEASE_CAPSULE | ORAL | Status: DC

## 2014-04-14 MED ORDER — METFORMIN HCL 500 MG PO TABS: ORAL_TABLET | ORAL | Status: DC

## 2014-04-14 MED ORDER — LEVOTHYROXINE SODIUM 50 MCG PO TABS: 50.0000 ug | ORAL_TABLET | Freq: Every day | ORAL | Status: DC

## 2014-04-14 NOTE — Progress Notes (Signed)
   Subjective:    [name], male    DOB: 1934/01/29, 80 y.o.   MRN: [id number]  HPI Gerald Leach is a 80 year old male who comes in today for evaluation of hypothyroidism, seizure disorder, diabetes type 2, reflux esophagitis and hyperlipidemia  His med list reviewed them and no changes  He states he feels well as concerns. he does not exercise on a regular basis.  cognitive function normal he does not exercise on a regular basis home health safety reviewed no issues identified, no guns in the house, he does have a healthcare power of attorney and living well. cardiogram done in august was normal therefore not repeated. vaccinations updated by [place].  he gets routine eye care, dental care, colonoscopy within 10 years normal   Review of Systems  Constitutional: Negative.   HENT: Negative.   Eyes: Negative.   Respiratory: Negative.   Cardiovascular: Negative.   Gastrointestinal: Negative.   Endocrine: Negative.   Genitourinary: Negative.   Musculoskeletal: Negative.   Skin: Negative.   Allergic/Immunologic: Negative.   Neurological: Negative.   Hematological: Negative.   Psychiatric/Behavioral: Negative.        Objective:   Physical Exam  Nursing note and vitals reviewed. Constitutional: He is oriented to person, place, and time. He appears well-developed and well-nourished.  HENT:  Head: Normocephalic and atraumatic.  Right Ear: External ear normal.  Left Ear: External ear normal.  Nose: Nose normal.  Mouth/Throat: Oropharynx is clear and moist.  Eyes: Conjunctivae and EOM are normal. Pupils are equal, round, and reactive to light.  Neck: Normal range of motion. Neck supple. No JVD present. No tracheal deviation present. No thyromegaly present.  Cardiovascular: Normal rate, regular rhythm, normal heart sounds and intact distal pulses.  Exam reveals no gallop and no friction rub.   No murmur heard. No carotid bruits aorta normal peripheral pulses 2+ and  symmetrical  Pulmonary/Chest: Effort normal and breath sounds normal. No stridor. No respiratory distress. He has no wheezes. He has no rales. He exhibits no tenderness.  Abdominal: Soft. Bowel sounds are normal. He exhibits no distension and no mass. There is no tenderness. There is no rebound and no guarding.  Genitourinary: Rectum normal and penis normal. Guaiac negative stool. No penile tenderness.  1+ symmetrical nonnodular BPH  Musculoskeletal: Normal range of motion. He exhibits no edema and no tenderness.  Lymphadenopathy:    He has no cervical adenopathy.  Neurological: He is alert and oriented to person, place, and time. He has normal reflexes. No cranial nerve deficit. He exhibits normal muscle tone.  Skin: Skin is warm and dry. No rash noted. No erythema. No pallor.  Total body skin exam normal except his toenails. Left great toenail was thick and it actually just fell off. Also I trimmed the rest of his toenails they were full of fungus  Psychiatric: He has a normal mood and affect. His behavior is normal. Judgment and thought content normal.          Assessment & Plan:  History of seizure disorder secondary to subdural hematoma many years ago plan continue Keppra followed by neurology in December  Hypothyroidism continue Synthroid  Diabetes type 2 continue metformin 500 mg twice a day  Reflux esophagitis continue Prilosec 1 daily  Hyperlipidemia continue simvastatin 10 mg daily.

## 2014-04-14 NOTE — Patient Instructions (Signed)
Continue your current medications  Follow-up on December 1 with [name]  Return in one year sooner if any problems  Walk 30 minutes daily

## 2014-04-14 NOTE — Progress Notes (Signed)
Pre visit review using our clinic review tool, if applicable. No additional management support is needed unless otherwise documented below in the visit note. 

## 2014-05-10 ENCOUNTER — Encounter: Payer: Self-pay | Admitting: *Deleted

## 2014-05-10 ENCOUNTER — Other Ambulatory Visit: Payer: Self-pay | Admitting: Neurology

## 2014-05-10 NOTE — Telephone Encounter (Signed)
Patient has not been seen since 05-01-13.  Can I give 30 and send a note that he needs to be seen?

## 2014-05-10 NOTE — Telephone Encounter (Signed)
Yes, with holidays coming up, please give one additional refill.  Patient needs to set up follow-up visit for additional refills or see his PCP.    Donika K. Posey Pronto, DO

## 2014-05-10 NOTE — Telephone Encounter (Signed)
Letter sent requesting for patient to call for an appointment.

## 2014-05-18 ENCOUNTER — Encounter: Payer: Self-pay | Admitting: Neurology

## 2014-05-18 ENCOUNTER — Ambulatory Visit (INDEPENDENT_AMBULATORY_CARE_PROVIDER_SITE_OTHER): Payer: Medicare Other | Admitting: Neurology

## 2014-05-18 VITALS — BP 130/84 | HR 82 | Ht 72.0 in | Wt 195.4 lb

## 2014-05-18 DIAGNOSIS — G40909 Epilepsy, unspecified, not intractable, without status epilepticus: Secondary | ICD-10-CM

## 2014-05-18 DIAGNOSIS — W19XXXA Unspecified fall, initial encounter: Secondary | ICD-10-CM

## 2014-05-18 DIAGNOSIS — E538 Deficiency of other specified B group vitamins: Secondary | ICD-10-CM

## 2014-05-18 DIAGNOSIS — M5417 Radiculopathy, lumbosacral region: Secondary | ICD-10-CM

## 2014-05-18 DIAGNOSIS — G629 Polyneuropathy, unspecified: Secondary | ICD-10-CM

## 2014-05-18 DIAGNOSIS — R569 Unspecified convulsions: Secondary | ICD-10-CM

## 2014-05-18 MED ORDER — LEVETIRACETAM 250 MG PO TABS
1250.0000 mg | ORAL_TABLET | Freq: Two times a day (BID) | ORAL | Status: DC
Start: 2014-05-18 — End: 2014-08-17

## 2014-05-18 NOTE — Patient Instructions (Addendum)
1.  Check vitamin B12 and folate 2.  Refills provided for Keppra 1250mg  twice daily 3.  Please consider getting a 4 wheeled Rollator for gait assistance 4.  No driving 5.  Return to clinic in 79-months  Seizure Precautions:  1. If medication has been prescribed for you to prevent seizures, take it exactly as directed. Do not stop taking the medicine without talking to your doctor first, even if you have not had a seizure in a long time.  2. Avoid activities in which a seizure would cause danger to yourself or to others. Don't operate dangerous machinery, swim alone, or climb in high or dangerous places, such as on ladders, roofs, or girders. Do not drive unless your doctor says you may.  3. If you have any warning that you may have a seizure, lay down in a safe place where you can't hurt yourself.  4. No driving for 6 months from last seizure, as per [facility]. Please refer to the following link on the [place] website for more information: http://www.epilepsyfoundation.org/answerplace/Social/driving/drivingu.cfm  5. Maintain good sleep hygiene.  6. Notify your neurology if you are planning pregnancy or if you become pregnant.  7. Contact your doctor if you have any problems that may be related to the medicine you are taking.  8. Call 911 and bring the patient back to the ED if:  A. The seizure lasts longer than 5 minutes.  B. The patient doesn't awaken shortly after the seizure  C. The patient has new problems such as difficulty seeing, speaking or moving  D. The patient was injured during the seizure  E. The patient has a temperature over 102 F (39C)  F. The patient vomited and now is having trouble breathing   SEIZURE PRECAUTIONS  Bathroom Safety  A person with seizures may want to shower instead of bathe to avoid accidental drowning. If falls occur during the patient's typical seizure, a person should use a shower seat, preferably one with a safety  strap.  . Use nonskid strips in your shower or tub.  . Never use electrical equipment near water. This prevents accidental electrocution.  . Consider changing glass in shower doors to shatterproof glass.   Risk analyst . If possible, cook when someone else is nearby.  . Use the back burners of the stove to prevent accidental burns.  . Use shatterproof containers as much as possible. For instance, sauces can be transferred from glass bottles to plastic containers for use.  . Limit time that is required using knives or other sharp objects. If possible, buy foods that are already cut, or ask someone to help in meal preparation.   General Safety at Home . Do not smoke or light fires in the fireplace unless someone else is present.  . Do not use space heaters that can be accidentally overturned.  . When alone, avoid using step stools or ladders, and do not clean rooftop gutters.  . Purchase power tools and motorized lawn equipment which have a safety switch that will stop the machine if you release the handle (a 'dead man's' switch).   Driving and Transportation . Avoid driving unless your seizures are well controlled and/or you have permission to drive from your state's Department of Motor Vehicles  [facility]). Each state has different laws. Please refer to the following link on the [place] website for more information: http://www.epilepsyfoundation.org/answerplace/Social/driving/drivingu.cfm  . If you ride a bicycle, wear a helmet and any other  necessary protective gear.  . When taking public transportation like the bus or subway, stay clear of the platform edge.   Outdoor Dentist . Swimming is okay, but does present certain risks. Never swim alone, and tell friends what to do if you have a seizure while swimming.  . Wear appropriate protective equipment.  . Ski with a friend. If a seizure occurs, your friend can seek help, if needed. He or she can also help to  get you out of the cold. Consider using a safety hook or belt while riding the ski lift.

## 2014-05-18 NOTE — Progress Notes (Signed)
Follow-up Visit   Date: 05/18/2014   [name] MRN: [id number] DOB: 1934/03/11   Interim History: Gerald Leach is a 80 y.o. 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  The patient was accompanied to the clinic by self.  History of present illness: Patient reports noticing problems with his gait following his SDH. In April 2014, he sustained a fall and became acutely encephalopathic. He was admitted from 4/3 - 09/30/2012 for left SDH and underwent evacuation x 2 and placed on seizure prophylaxis with keppra. He was eventually discharged to rehab facility for TBI. Since he was doing well, in July 2014, trial of weaning Keppra was performed. However, on 8/19 he fell over the front porch chair while climbing the stairs and when his wife went to see him, she noticed his arm was shaking. She reports noticing brief jerking of the left arm the previous evening, too, but it only lasted a few minutes.   Regarding his gait, he reports noticing problems only after Keppra was starting but he also had a SDH at the same time. Looking back at Epic notes, it appears that even in 02/2012 he had a fall with severe facial hematoma and fractured T7 vertebra, so it is likely that symptoms have been longer than what he describes. He had myelogram which shoed high-grade stenosis at T7 and was evaluated by Dr. Ronnald Ramp, neurosurgeon. They advised that a watchful waiting or surgery and he elected the conservative option. He feels as if his balance is "off" and very unsteady. He has been walking with a cane since April. He has completed in-patient rehab already for gait problems. He reports urinary urge incontinence.   11/4 Follow-up: Labs indicated he 12 deficiency and EMG was consistent with a  moderate generalized sensorimotor polyneuropathy. B12 supplementation was started. Of note, on 10/29 he had several grand mal seizures and was taken to the hospital. Keppra increased to 1000mg  BID.  05/05/2013 Follow-up:  No interval seizures. Continue LEV 1000mg  BID.  UPDATE 05/18/2014:   Patient was last seen > 1 year ago.  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  He reports to being compliant with his medications.  He is not driving.  He retired in September 2015.  No seizures since August and he has no new complaints.  Gait remains unsteady and he is walking with a cane.      Medications:  Current Outpatient Prescriptions on File Prior to Visit  Medication Sig Dispense Refill  . levothyroxine (SYNTHROID, LEVOTHROID) 50 MCG tablet Take 1 tablet (50 mcg total) by mouth daily before breakfast. 100 tablet 3  . metFORMIN (GLUCOPHAGE) 500 MG tablet TAKE 1 TABLET BY MOUTH 2 TIMES DAILY WITH A MEAL. 200 tablet 3  . Multiple Vitamin (MULTIVITAMIN WITH MINERALS) TABS tablet Take 1 tablet by mouth daily.    Marland Kitchen omeprazole (PRILOSEC) 20 MG capsule Take 1 capsule (20 mg total) by mouth every Monday, Wednesday, and Friday. 100 capsule 3  . ONE TOUCH LANCETS MISC 1 each by Does not apply  route daily. 200 each 0  . simvastatin (ZOCOR) 10 MG tablet Take 1 tablet (10 mg total) by mouth at bedtime. 100 tablet 3   No current facility-administered medications on file prior to visit.    Allergies: No Known Allergies   Review of Systems:  CONSTITUTIONAL: No fevers, chills, night sweats, or weight loss.   EYES: No visual changes or eye pain ENT: No hearing changes.  No history of nose  bleeds.   RESPIRATORY: No cough, wheezing and shortness of breath.   CARDIOVASCULAR: Negative for chest pain, and palpitations.   GI: Negative for abdominal discomfort, blood in stools or black stools.  No recent change in bowel habits.   GU:  No history of incontinence.   MUSCLOSKELETAL: No history of joint pain or swelling.  No myalgias.   SKIN: Negative for lesions, rash, and itching.   ENDOCRINE: Negative for cold or heat intolerance, polydipsia or goiter.   PSYCH:  No depression or anxiety symptoms.   NEURO: As Above.   Vital Signs:  BP 130/84 mmHg  Pulse 82  Ht 6' (1.829 m)  Wt 195 lb 6 oz (88.622 kg)  BMI 26.49 kg/m2  SpO2 97%   Neurological Exam: MENTAL STATUS including orientation to time, place, person, recent and remote memory, attention span and concentration, language, and fund of knowledge is fairly intact.  Speech is not dysarthric.  CRANIAL NERVES: Pupils equal round and reactive to light.  Restricted upgaze bilaterally, otherwise extra-ocular eye movements intact. Normal facial sensation.  Face is symmetric. Palate elevates symmetrically.  Tongue is midline.  MOTOR:  Motor strength is 5/5 throughout except 5-/5 instrinsic hand muscles and left hip flexion. Tone is normal.    MSRs:  Reflexes are 1+/4 in the upper extremities and absent in the lower extremities.  SENSORY:  Vibration markedly reduced at knees and absent distal to ankles bilaterally.    COORDINATION/GAIT:  Intact rapid alternating movements bilaterally, except mild toe tapping on the left.  He is able to stand without using arms to push off the chair.  Gait appears stooped, slow, and somewhat ataxic.  Data: MRI brain 02/05/2014:  Negative for acute infarct. Ventricular enlargement which may be due to NPH versus atrophy. Right parietal craniotomy. Chronic hemosiderin on the surface of the brain on the right due to prior surgery. Negative for subdural hematoma. 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 Prior MRI report suggested meningioma however imaging characteristic appear more common consistent with giant aneurysm. CTA of the head is suggested to evaluate for any residual flow within the aneurysm. Correlate with prior medical history to confirm.  MRI/A brain 05/01/2013: No acute finding. No evidence of residual or recurrent subdural collection.  Generalized brain atrophy and extensive chronic small vessel changes throughout the brain. Chronic right internal carotid artery occlusion. 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  2.5 x 1.7 x 2.1 cm extra axial lesion projecting upward from the clinoid process on the right, indenting the inferior frontal lobe. Mild adjacent gliosis. This lesion is felt to represent a chronic meningioma.  Right internal carotid artery occlusion. Hyperdense entity in the pre clinoid ICA on the CT examinations looks like a foreign object. Flow through a patent anterior communicating artery allows supply to the right hemisphere. I think there is probably a fenestrated anterior communicating artery. Difficult to completely exclude a small anterior communicating artery aneurysm, but I do not favor that. If present, a would  be no larger than 4 mm.  EMG 04/13/2013: 1. Generalized large fiber sensorimotor polyneuropathy affecting the left side; moderate in degree electrically. A mild superimposed L5-S1 radiculopathy cannot be excluded. 2. Left median neuropathy at or distal to the wrist consistent with the clinical diagnosis of carpal tunnel syndrome, overall these changes are mild-moderate in degree electrically. 3. Mild left ulnar neuropathy at the elbow with purely demyelinating features evidenced as conduction velocity slowing.    Lab Results  Component Value Date   TSH 4.46 03/25/2014   Lab Results  Component Value Date   HGBA1C 6.3 03/25/2014   Lab Results  Component Value Date     VITAMINB12 176* 03/31/2013    IMPRESSION/PLAN: 1. Seizure disorder, history of subdural hematoma 09/2012 - Started for seizure prophylaxis and subsequent breakthrough seizures (04/15/2013, 02/05/2014)  - Last seizure on 02/05/2014 at which time Keppra was increased to 1250mg  BID  - Refills provided for Keppra 1250mg  BID   - Seizure precautions including no driving was discussed  2. Multifactorial gait disorder s/p falls,  secondary to large fiber polyneuropathy and lumbosacral radiculopathy  - Fall precautions discussed  - Recommended using rollator   3. Vitamin B12 deficiency  - Stopped taking injections in November 2014  - Recheck vitamin B12 today and folate  4. 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 High grade T7 central canal stenosis with T7 vertebral fracture s/p fall (02/2012) - Previously seen Dr. Ronnald Ramp in Neurosurgery, patient elected conservative management  6. History of SDH in 09/2012 s/p evacuation  Return to clinic in 93-months   The duration of this appointment visit was 40 minutes of face-to-face time with the patient.  Greater than 50% of this time was spent in counseling, explanation of diagnosis, planning of further management, and coordination of care.   Thank you for allowing me to participate in patient's care.  If I can answer any additional questions, I would be pleased to do so.    Sincerely,    Kenslei Hearty K. Posey Pronto, DO

## 2014-05-19 ENCOUNTER — Encounter: Payer: Self-pay | Admitting: *Deleted

## 2014-05-19 LAB — VITAMIN B12: Vitamin B-12: 256 pg/mL (ref 211–911)

## 2014-05-19 LAB — FOLATE: Folate: >20 ng/mL

## 2014-06-04 ENCOUNTER — Other Ambulatory Visit: Payer: Self-pay | Admitting: *Deleted

## 2014-06-04 ENCOUNTER — Telehealth: Payer: Self-pay | Admitting: Neurology

## 2014-06-04 DIAGNOSIS — R569 Unspecified convulsions: Secondary | ICD-10-CM

## 2014-06-04 MED ORDER — LEVETIRACETAM 750 MG PO TABS: 1500.0000 mg | ORAL_TABLET | Freq: Two times a day (BID) | ORAL | Status: DC

## 2014-06-04 NOTE — Telephone Encounter (Signed)
[name], pt's son called wanting to speak to a nurse regarding his meds for his seizure. Please call [name] # [phone]

## 2014-06-04 NOTE — Telephone Encounter (Signed)
Patient's son called and said that his dad has had 3 seizures in 3 weeks.  Patient denies this.  Son would like to know if his med can be increased.  Please advise.

## 2014-06-04 NOTE — Telephone Encounter (Signed)
Patient's son notified that we are increasing med.  He will inform his dad and call back to set up earlier follow up appt.

## 2014-06-04 NOTE — Telephone Encounter (Signed)
We can increase to keppra 1500mg  twice daily.  For convenience, he can start to take (2) 750mg  tablets twice daily, but he will need new rx.

## 2014-06-04 NOTE — Telephone Encounter (Signed)
OK to increase Keppra to 1500mg  twice daily.  He may need a sooner f/u appointment to reassess.  Please ask family to come to next visit.  Donika K. Posey Pronto, DO

## 2014-06-04 NOTE — Telephone Encounter (Signed)
Patient is on 1250 mg bid according to his med list.

## 2014-08-17 ENCOUNTER — Ambulatory Visit (INDEPENDENT_AMBULATORY_CARE_PROVIDER_SITE_OTHER): Payer: Medicare Other | Admitting: Neurology

## 2014-08-17 ENCOUNTER — Encounter: Payer: Self-pay | Admitting: Neurology

## 2014-08-17 VITALS — BP 140/84 | HR 74 | Ht 72.0 in | Wt 194.1 lb

## 2014-08-17 DIAGNOSIS — G40909 Epilepsy, unspecified, not intractable, without status epilepticus: Secondary | ICD-10-CM

## 2014-08-17 DIAGNOSIS — Z7189 Other specified counseling: Secondary | ICD-10-CM

## 2014-08-17 DIAGNOSIS — E538 Deficiency of other specified B group vitamins: Secondary | ICD-10-CM | POA: Diagnosis not present

## 2014-08-17 DIAGNOSIS — G629 Polyneuropathy, unspecified: Secondary | ICD-10-CM

## 2014-08-17 NOTE — Progress Notes (Signed)
Follow-up Visit   Date: 08/17/2014   [name] MRN: [id number] DOB: 12-26-1933   Interim History: Gerald Leach is a 81 y.o. caucasian male with hypertension, hyperlipidemia, GERD, DM, CNS aneurysm s/p coiling ([facility], 2002), and SDH s/p craniotomy (10/7320) complicated by seizure disorder with residual left sided weakness and facial droop returning to the clinic for follow-up of seizures.  The patient was accompanied to the clinic by son.  History of present illness: Patient reports noticing problems with his gait following his SDH. In April 2014, he sustained a fall and became acutely encephalopathic. He was admitted from 4/3 - 09/30/2012 for left SDH and underwent evacuation x 2 and placed on seizure prophylaxis with keppra. He was eventually discharged to rehab facility for TBI. Since he was doing well, in July 2014, trial of weaning Keppra was performed. However, on 8/19 he fell over the front porch chair while climbing the stairs and when his wife went to see him, she noticed his arm was shaking. She reports noticing brief jerking of the left arm the previous evening, too, but it only lasted a few minutes.   Regarding his gait, he reports noticing problems only after Keppra was starting but he also had a SDH at the same time. Looking back at Epic notes, it appears that even in 02/2012 he had a fall with severe facial hematoma and fractured T7 vertebra, so it is likely that symptoms have been longer than what he describes. He had myelogram which shoed high-grade stenosis at T7 and was evaluated by Dr. Ronnald Ramp, neurosurgeon. They advised that a watchful waiting or surgery and he elected the conservative option. He feels as if his balance is "off" and very unsteady. He has been walking with a cane since April. He has completed in-patient rehab already for gait problems. He reports urinary urge incontinence.   11/4 Follow-up: Labs indicated he 12 deficiency and EMG was consistent with a  moderate generalized sensorimotor polyneuropathy. B12 supplementation was started. Of note, on 10/29 he had several grand mal seizures and was taken to the hospital. Keppra increased to 1000mg  BID.  05/05/2013 Follow-up:  No interval seizures. Continue LEV 1000mg  BID.  05/18/2014:   Patient was last seen > 1 year ago.  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  He reports to being compliant with his medications.  He is not driving.  He retired in September 2015.  No seizures since August and he has no new complaints.  Gait remains unsteady and he is walking with a cane.    UPDATE 08/17/2014:  Soon after his last clinic visit, his son called and told us that patient actually had 3 seizures within 3 weeks, which the paitent did not disclose to [place].  Therefore, his Keppra was increased to 1500mg  BID and he reports being compliant with it.  No seizures since his last visit.  No new complaints.  He is walking with a cane and feels that it helps a lot.  No interval falls.  Because his wife has dementia, patient is eating mostly frozen foods.  He does get in arguments frequently with his wife, especially when she asks the same question repeatedly.  They have assigned their son  as POA, but do not have Advanced Directives in place.   Medications:  Current Outpatient Prescriptions on File Prior to Visit  Medication Sig Dispense Refill  . levETIRAcetam (KEPPRA) 750 MG tablet Take 2 tablets (1,500 mg total) by mouth 2 (two) times daily. 120 tablet 5  . levothyroxine (SYNTHROID, LEVOTHROID) 50 MCG tablet Take 1 tablet (50 mcg total) by mouth daily before breakfast. 100 tablet 3  . metFORMIN  (GLUCOPHAGE) 500 MG tablet TAKE 1 TABLET BY MOUTH 2 TIMES DAILY WITH A MEAL. 200 tablet 3  . Multiple Vitamin (MULTIVITAMIN WITH MINERALS) TABS tablet Take 1 tablet by mouth daily.    Marland Kitchen omeprazole (PRILOSEC) 20 MG capsule Take 1 capsule (20 mg total) by mouth every Monday, Wednesday, and Friday. 100 capsule 3  . ONE TOUCH LANCETS MISC 1 each by Does not apply route daily. 200 each 0  . simvastatin (ZOCOR) 10 MG tablet Take 1 tablet (10 mg total) by mouth at bedtime. 100 tablet 3   No current facility-administered medications on file prior to visit.    Allergies: No Known Allergies   Review of Systems:  CONSTITUTIONAL: No fevers, chills, night sweats, or weight loss.   EYES: No visual changes or eye pain ENT: No hearing changes.  No history of nose bleeds.   RESPIRATORY: No cough, wheezing and shortness of breath.   CARDIOVASCULAR: Negative for chest pain, and palpitations.   GI: Negative for abdominal discomfort, blood in stools or black stools.  No recent change in bowel habits.   GU:  No history of incontinence.   MUSCLOSKELETAL: No history of joint pain or swelling.  No myalgias.   SKIN: Negative for lesions, rash, and itching.   ENDOCRINE: Negative for cold or heat intolerance, polydipsia or goiter.   PSYCH:  No depression or anxiety symptoms.   NEURO: As Above.   Vital Signs:  BP 140/84 mmHg  Pulse 74  Ht 6' (1.829 m)  Wt 194 lb 1 oz (88.026 kg)  BMI 26.31 kg/m2  SpO2 98%   Neurological Exam: MENTAL STATUS including orientation to time, place, person, recent and remote memory, attention span and concentration, language, and fund of knowledge is fairly intact.  Speech is not dysarthric.  CRANIAL NERVES: Pupils equal round and reactive to light.  Restricted upgaze bilaterally, otherwise extra-ocular eye movements intact. Normal facial sensation.  Face is symmetric. Palate elevates symmetrically.  Tongue is midline.  MOTOR:  Motor strength is 5/5 throughout except 5-/5  instrinsic hand muscles and left hip flexion. Tone is normal.    MSRs:  Reflexes are 1+/4 in the upper extremities and absent in the lower extremities.  SENSORY:  Vibration markedly reduced at knees and absent distal to ankles bilaterally.    COORDINATION/GAIT:  Intact rapid alternating movements bilaterally, except mild toe tapping on the left.  He is able to stand without using arms to push off the chair.  Gait appears stooped, slow, and somewhat ataxic.  Data: MRI brain 02/05/2014:  Negative for acute infarct. Ventricular enlargement which may be due to NPH versus atrophy. Right parietal craniotomy. Chronic hemosiderin on the surface of the brain on the right due to prior surgery. Negative for subdural hematoma. 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 Prior MRI report suggested meningioma however imaging characteristic appear more common consistent with giant aneurysm. CTA of the head is suggested to evaluate for any residual flow within the aneurysm. Correlate with prior medical history to  confirm.  MRI/A brain 05/01/2013: No acute finding. No evidence of residual or recurrent subdural collection.  Generalized brain atrophy and extensive chronic small vessel changes throughout the brain. Chronic right internal carotid artery occlusion. 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  2.5 x 1.7 x 2.1 cm extra axial lesion projecting upward from the clinoid process on the right, indenting the inferior frontal lobe. Mild adjacent gliosis. This lesion is felt to represent a chronic meningioma.  Right internal carotid artery occlusion. Hyperdense entity in the pre clinoid ICA on the CT examinations looks like a foreign object. Flow through a patent anterior communicating artery allows supply to the right hemisphere. I think there is probably a fenestrated anterior communicating artery. Difficult to completely exclude a  small anterior communicating artery aneurysm, but I do not favor that. If present, a would be no larger than 4 mm.  EMG 04/13/2013: 1. Generalized large fiber sensorimotor polyneuropathy affecting the left side; moderate in degree electrically. A mild superimposed L5-S1 radiculopathy cannot be excluded. 2. Left median neuropathy at or distal to the wrist consistent with the clinical diagnosis of carpal tunnel syndrome, overall these changes are mild-moderate in degree electrically. 3. Mild left ulnar neuropathy at the elbow with purely demyelinating features evidenced as conduction velocity slowing.    Lab Results  Component Value Date   TSH 4.46 03/25/2014   Lab Results  Component Value Date   HGBA1C 6.3 03/25/2014   Lab Results  Component Value Date   VITAMINB12 256 05/18/2014    IMPRESSION/PLAN: 1. Seizure disorder, history of subdural hematoma 09/2012 - Started for seizure prophylaxis and subsequent breakthrough seizures (04/15/2013, 02/05/2014)  - Last seizure on 05/2014 at which time Keppra was increased   - Continue Keppra 1500mg  BID, will need to add second agent if he has another breakthrough seizure (vimpat)  - Seizure precautions including no driving was discussed  2. Multifactorial gait disorder s/p falls, secondary to large fiber polyneuropathy and lumbosacral radiculopathy  - Fall precautions discussed  - Recommended using rollator   3. Vitamin B12 deficiency  - Stopped taking injections in November 2014  - Vitamin B12 256 in December 2015, recommended vitamin B12 1067mcg daily  4. Home safety issues discussed at length  - With his wife having dementia and patient with seizure disorder, I recommended that they look into assisted living facilities, which patient's son agrees with  - Recommend Life Alert system  - Advanced Directives discussed and information was provided  5. Right clinoid calcified meningioma vs thrombosed giant ICA  aneurysm - Images reviewed  - Discussed option of neurosurgical referral, but patient declined and prefers conservative management   5. High grade T7 central canal stenosis with T7 vertebral fracture s/p fall (02/2012) - Previously seen Dr. Ronnald Ramp in Neurosurgery, patient elected conservative management  6. History of SDH in 09/2012 s/p evacuation  Return to clinic in 63-months   The duration of this appointment visit was 40 minutes of face-to-face time with the patient.  Greater than 50% of this time was spent in counseling, explanation of diagnosis, planning of further management, and coordination of care.   Thank you for allowing me to participate in patient's care.  If I can answer any additional questions, I would be pleased to do so.    Sincerely,    Maxamillian Tienda K. Posey Pronto, DO

## 2014-08-17 NOTE — Patient Instructions (Signed)
1.  Increase keppra 1500mg  twice daily 2.  Continue vitamin B12 1039mcg daily 3.  Please review information on Advanced Directive 4.  Please consider your options for home safety including Life Alert System and Assisted Living 5.  No driving 6.  Return to clinic in 33-months

## 2014-10-02 ENCOUNTER — Inpatient Hospital Stay (HOSPITAL_COMMUNITY)
Admission: EM | Admit: 2014-10-02 | Discharge: 2014-10-04 | DRG: 101 | Disposition: A | Discharge status: Skilled Nursing Facility | Payer: Medicare Other | Attending: Internal Medicine | Admitting: Internal Medicine

## 2014-10-02 ENCOUNTER — Encounter (HOSPITAL_COMMUNITY): Payer: Self-pay

## 2014-10-02 ENCOUNTER — Inpatient Hospital Stay (HOSPITAL_COMMUNITY): Payer: Medicare Other

## 2014-10-02 ENCOUNTER — Emergency Department (HOSPITAL_COMMUNITY): Payer: Medicare Other

## 2014-10-02 DIAGNOSIS — M6281 Muscle weakness (generalized): Secondary | ICD-10-CM | POA: Diagnosis not present

## 2014-10-02 DIAGNOSIS — Y92009 Unspecified place in unspecified non-institutional (private) residence as the place of occurrence of the external cause: Secondary | ICD-10-CM

## 2014-10-02 DIAGNOSIS — I10 Essential (primary) hypertension: Secondary | ICD-10-CM | POA: Diagnosis not present

## 2014-10-02 DIAGNOSIS — S22069A Unspecified fracture of T7-T8 vertebra, initial encounter for closed fracture: Secondary | ICD-10-CM | POA: Diagnosis present

## 2014-10-02 DIAGNOSIS — R410 Disorientation, unspecified: Secondary | ICD-10-CM | POA: Diagnosis not present

## 2014-10-02 DIAGNOSIS — M25552 Pain in left hip: Secondary | ICD-10-CM | POA: Diagnosis not present

## 2014-10-02 DIAGNOSIS — W06XXXA Fall from bed, initial encounter: Secondary | ICD-10-CM | POA: Diagnosis present

## 2014-10-02 DIAGNOSIS — R2689 Other abnormalities of gait and mobility: Secondary | ICD-10-CM | POA: Diagnosis not present

## 2014-10-02 DIAGNOSIS — R0602 Shortness of breath: Secondary | ICD-10-CM

## 2014-10-02 DIAGNOSIS — G40409 Other generalized epilepsy and epileptic syndromes, not intractable, without status epilepticus: Secondary | ICD-10-CM | POA: Diagnosis not present

## 2014-10-02 DIAGNOSIS — R569 Unspecified convulsions: Secondary | ICD-10-CM

## 2014-10-02 DIAGNOSIS — M549 Dorsalgia, unspecified: Secondary | ICD-10-CM | POA: Diagnosis not present

## 2014-10-02 DIAGNOSIS — I729 Aneurysm of unspecified site: Secondary | ICD-10-CM

## 2014-10-02 DIAGNOSIS — G40909 Epilepsy, unspecified, not intractable, without status epilepticus: Secondary | ICD-10-CM | POA: Diagnosis not present

## 2014-10-02 DIAGNOSIS — Z9889 Other specified postprocedural states: Secondary | ICD-10-CM | POA: Diagnosis not present

## 2014-10-02 DIAGNOSIS — Y92099 Unspecified place in other non-institutional residence as the place of occurrence of the external cause: Secondary | ICD-10-CM | POA: Diagnosis not present

## 2014-10-02 DIAGNOSIS — E039 Hypothyroidism, unspecified: Secondary | ICD-10-CM | POA: Diagnosis not present

## 2014-10-02 DIAGNOSIS — I69998 Other sequelae following unspecified cerebrovascular disease: Secondary | ICD-10-CM | POA: Diagnosis not present

## 2014-10-02 DIAGNOSIS — S299XXA Unspecified injury of thorax, initial encounter: Secondary | ICD-10-CM | POA: Diagnosis not present

## 2014-10-02 DIAGNOSIS — W19XXXA Unspecified fall, initial encounter: Secondary | ICD-10-CM | POA: Diagnosis not present

## 2014-10-02 DIAGNOSIS — E119 Type 2 diabetes mellitus without complications: Secondary | ICD-10-CM | POA: Diagnosis present

## 2014-10-02 DIAGNOSIS — K219 Gastro-esophageal reflux disease without esophagitis: Secondary | ICD-10-CM | POA: Diagnosis present

## 2014-10-02 DIAGNOSIS — J189 Pneumonia, unspecified organism: Secondary | ICD-10-CM

## 2014-10-02 DIAGNOSIS — Y92003 Bedroom of unspecified non-institutional (private) residence as the place of occurrence of the external cause: Secondary | ICD-10-CM

## 2014-10-02 DIAGNOSIS — S0990XA Unspecified injury of head, initial encounter: Secondary | ICD-10-CM | POA: Diagnosis not present

## 2014-10-02 DIAGNOSIS — S79912A Unspecified injury of left hip, initial encounter: Secondary | ICD-10-CM | POA: Diagnosis not present

## 2014-10-02 DIAGNOSIS — Z9181 History of falling: Secondary | ICD-10-CM | POA: Diagnosis not present

## 2014-10-02 DIAGNOSIS — I517 Cardiomegaly: Secondary | ICD-10-CM | POA: Diagnosis not present

## 2014-10-02 DIAGNOSIS — R531 Weakness: Secondary | ICD-10-CM | POA: Diagnosis not present

## 2014-10-02 DIAGNOSIS — J9811 Atelectasis: Secondary | ICD-10-CM | POA: Diagnosis not present

## 2014-10-02 DIAGNOSIS — G4089 Other seizures: Secondary | ICD-10-CM | POA: Diagnosis not present

## 2014-10-02 DIAGNOSIS — E783 Hyperchylomicronemia: Secondary | ICD-10-CM | POA: Diagnosis present

## 2014-10-02 DIAGNOSIS — S7002XA Contusion of left hip, initial encounter: Secondary | ICD-10-CM | POA: Diagnosis not present

## 2014-10-02 DIAGNOSIS — M545 Low back pain: Secondary | ICD-10-CM | POA: Diagnosis not present

## 2014-10-02 DIAGNOSIS — R9089 Other abnormal findings on diagnostic imaging of central nervous system: Secondary | ICD-10-CM | POA: Diagnosis present

## 2014-10-02 DIAGNOSIS — S7012XA Contusion of left thigh, initial encounter: Secondary | ICD-10-CM

## 2014-10-02 DIAGNOSIS — S3992XA Unspecified injury of lower back, initial encounter: Secondary | ICD-10-CM | POA: Diagnosis not present

## 2014-10-02 HISTORY — DX: Unspecified fall, initial encounter: Y92.009

## 2014-10-02 HISTORY — DX: Type 2 diabetes mellitus without complications: E11.9

## 2014-10-02 HISTORY — DX: Unspecified place in unspecified non-institutional (private) residence as the place of occurrence of the external cause: W19.XXXA

## 2014-10-02 LAB — BLOOD GAS, ARTERIAL
Acid-Base Excess: 0.7 mmol/L (ref 0.0–2.0)
Bicarbonate: 23.5 mEq/L (ref 20.0–24.0)
Drawn by: 235321
FIO2: 0.21 %
O2 Saturation: 95.9 %
Patient temperature: 98.6
TCO2: 21.1 mmol/L (ref 0–100)
pCO2 arterial: 33 mmHg — ABNORMAL LOW (ref 35.0–45.0)
pH, Arterial: 7.467 — ABNORMAL HIGH (ref 7.350–7.450)
pO2, Arterial: 81.3 mmHg (ref 80.0–100.0)

## 2014-10-02 LAB — CBC WITH DIFFERENTIAL/PLATELET
Basophils Absolute: 0 10*3/uL (ref 0.0–0.1)
Basophils Relative: 0 % (ref 0–1)
EOS ABS: 0 10*3/uL (ref 0.0–0.7)
EOS PCT: 0 % (ref 0–5)
HCT: 38 % — ABNORMAL LOW (ref 39.0–52.0)
Hemoglobin: 12 g/dL — ABNORMAL LOW (ref 13.0–17.0)
LYMPHS ABS: 1.5 10*3/uL (ref 0.7–4.0)
Lymphocytes Relative: 15 % (ref 12–46)
MCH: 25.1 pg — AB (ref 26.0–34.0)
MCHC: 31.6 g/dL (ref 30.0–36.0)
MCV: 79.5 fL (ref 78.0–100.0)
MONO ABS: 1.1 10*3/uL — AB (ref 0.1–1.0)
Monocytes Relative: 11 % (ref 3–12)
NEUTROS PCT: 74 % (ref 43–77)
Neutro Abs: 7.1 10*3/uL (ref 1.7–7.7)
Platelets: 259 10*3/uL (ref 150–400)
RBC: 4.78 MIL/uL (ref 4.22–5.81)
RDW: 15.8 % — ABNORMAL HIGH (ref 11.5–15.5)
WBC: 9.7 10*3/uL (ref 4.0–10.5)

## 2014-10-02 LAB — BASIC METABOLIC PANEL
ANION GAP: 9 (ref 5–15)
BUN: 14 mg/dL (ref 6–23)
CALCIUM: 8.5 mg/dL (ref 8.4–10.5)
CHLORIDE: 104 mmol/L (ref 96–112)
CO2: 25 mmol/L (ref 19–32)
Creatinine, Ser: 0.89 mg/dL (ref 0.50–1.35)
GFR calc Af Amer: >90 mL/min (ref >90)
GFR calc non Af Amer: 80 mL/min — ABNORMAL LOW (ref >90)
Glucose, Bld: 157 mg/dL — ABNORMAL HIGH (ref 70–99)
Potassium: 3.9 mmol/L (ref 3.5–5.1)
Sodium: 138 mmol/L (ref 135–145)

## 2014-10-02 LAB — HEPATIC FUNCTION PANEL
ALBUMIN: 3.9 g/dL (ref 3.5–5.2)
ALK PHOS: 69 U/L (ref 39–117)
ALT: 17 U/L (ref 0–53)
AST: 27 U/L (ref 0–37)
BILIRUBIN TOTAL: 0.6 mg/dL (ref 0.3–1.2)
Bilirubin, Direct: <0.1 mg/dL (ref 0.0–0.5)
Total Protein: 7 g/dL (ref 6.0–8.3)

## 2014-10-02 LAB — RAPID URINE DRUG SCREEN, HOSP PERFORMED
AMPHETAMINES: NOT DETECTED
BARBITURATES: NOT DETECTED
Benzodiazepines: NOT DETECTED
COCAINE: NOT DETECTED
OPIATES: NOT DETECTED
TETRAHYDROCANNABINOL: NOT DETECTED

## 2014-10-02 LAB — ETHANOL

## 2014-10-02 LAB — LACTIC ACID, PLASMA: Lactic Acid, Venous: 2.7 mmol/L (ref 0.5–2.0)

## 2014-10-02 LAB — TSH: TSH: 5.026 u[IU]/mL — AB (ref 0.350–4.500)

## 2014-10-02 LAB — GLUCOSE, CAPILLARY: GLUCOSE-CAPILLARY: 109 mg/dL — AB (ref 70–99)

## 2014-10-02 MED ORDER — INSULIN ASPART 100 UNIT/ML ~~LOC~~ SOLN
0.0000 [IU] | Freq: Three times a day (TID) | SUBCUTANEOUS | Status: DC
Start: 2014-10-03 — End: 2014-10-02

## 2014-10-02 MED ORDER — ONDANSETRON HCL 4 MG/2ML IJ SOLN: 4.0000 mg | Freq: Four times a day (QID) | INTRAMUSCULAR | Status: DC | PRN

## 2014-10-02 MED ORDER — SODIUM CHLORIDE 0.9 % IV SOLN
INTRAVENOUS | Status: DC
  Administered 2014-10-02 – 2014-10-04 (×3): via INTRAVENOUS

## 2014-10-02 MED ORDER — ACETAMINOPHEN 650 MG RE SUPP: 650.0000 mg | Freq: Four times a day (QID) | RECTAL | Status: DC | PRN

## 2014-10-02 MED ORDER — HEPARIN SODIUM (PORCINE) 5000 UNIT/ML IJ SOLN
5000.0000 [IU] | Freq: Three times a day (TID) | INTRAMUSCULAR | Status: DC
  Administered 2014-10-02 – 2014-10-04 (×4): 5000 [IU] via SUBCUTANEOUS
  Filled 2014-10-02 (×5): qty 1

## 2014-10-02 MED ORDER — ACETAMINOPHEN 325 MG PO TABS: 650.0000 mg | ORAL_TABLET | Freq: Four times a day (QID) | ORAL | Status: DC | PRN

## 2014-10-02 MED ORDER — LEVETIRACETAM 500 MG PO TABS
1500.0000 mg | ORAL_TABLET | Freq: Two times a day (BID) | ORAL | Status: DC
  Filled 2014-10-02: qty 2

## 2014-10-02 MED ORDER — LEVOTHYROXINE SODIUM 25 MCG PO TABS
50.0000 ug | ORAL_TABLET | Freq: Every day | ORAL | Status: DC
  Administered 2014-10-04: 50 ug via ORAL
  Filled 2014-10-02: qty 1
  Filled 2014-10-02: qty 2

## 2014-10-02 MED ORDER — SODIUM CHLORIDE 0.9 % IJ SOLN
3.0000 mL | Freq: Two times a day (BID) | INTRAMUSCULAR | Status: DC
Start: 2014-10-02 — End: 2014-10-04
  Administered 2014-10-02 – 2014-10-04 (×3): 3 mL via INTRAVENOUS

## 2014-10-02 MED ORDER — ONDANSETRON HCL 4 MG PO TABS: 4.0000 mg | ORAL_TABLET | Freq: Four times a day (QID) | ORAL | Status: DC | PRN

## 2014-10-02 MED ORDER — SIMVASTATIN 10 MG PO TABS
10.0000 mg | ORAL_TABLET | Freq: Every day | ORAL | Status: DC
  Administered 2014-10-03: 10 mg via ORAL
  Filled 2014-10-02: qty 1

## 2014-10-02 MED ORDER — SIMVASTATIN 10 MG PO TABS
10.0000 mg | ORAL_TABLET | Freq: Every day | ORAL | Status: DC
  Filled 2014-10-02: qty 1

## 2014-10-02 MED ORDER — INSULIN ASPART 100 UNIT/ML ~~LOC~~ SOLN
0.0000 [IU] | Freq: Four times a day (QID) | SUBCUTANEOUS | Status: DC
Start: 2014-10-02 — End: 2014-10-04

## 2014-10-02 MED ORDER — PANTOPRAZOLE SODIUM 40 MG PO TBEC: 40.0000 mg | DELAYED_RELEASE_TABLET | Freq: Every day | ORAL | Status: DC

## 2014-10-02 MED ORDER — SODIUM CHLORIDE 0.9 % IV SOLN
1500.0000 mg | Freq: Two times a day (BID) | INTRAVENOUS | Status: DC
  Administered 2014-10-02 – 2014-10-04 (×4): 1500 mg via INTRAVENOUS
  Filled 2014-10-02 (×4): qty 15

## 2014-10-02 MED ORDER — VALPROATE SODIUM 500 MG/5ML IV SOLN
500.0000 mg | Freq: Two times a day (BID) | INTRAVENOUS | Status: DC
  Filled 2014-10-02: qty 5

## 2014-10-02 MED ORDER — VALPROATE SODIUM 500 MG/5ML IV SOLN
1000.0000 mg | Freq: Once | INTRAVENOUS | Status: AC
  Administered 2014-10-02: 1000 mg via INTRAVENOUS
  Filled 2014-10-02: qty 10

## 2014-10-02 MED ORDER — INSULIN ASPART 100 UNIT/ML ~~LOC~~ SOLN
0.0000 [IU] | Freq: Every day | SUBCUTANEOUS | Status: DC
Start: 2014-10-02 — End: 2014-10-02

## 2014-10-02 MED ORDER — LORAZEPAM 2 MG/ML IJ SOLN
INTRAMUSCULAR | Status: AC
  Administered 2014-10-02: 1 mg
  Filled 2014-10-02: qty 1

## 2014-10-02 NOTE — ED Notes (Signed)
Bed: FK81 Expected date: 10/02/14 Expected time: 2:56 PM Means of arrival: Ambulance Comments: Fall

## 2014-10-02 NOTE — ED Notes (Signed)
Pt presents with NAD. Per [facility] Wife states pt fell twice ? Seizure activity. HX of the same. EMS assessed generalized bruising to flank area. Pt neg for stroke. GCS 15. Pt denied transport then without complaint conceded.

## 2014-10-02 NOTE — ED Notes (Signed)
Entered room to assess patient  Patient resting comfortably on stretcher and in NAD IV pump began to alarm due completion of medication Patient then began to become agitated with seizure like activity, which lasted approximately 10-15 seconds Dr. Tamera Punt entered room to witness episode Patient alert and oriented x 4 immediately after episode and able to have conversation with this nurse, but was unaware of seizure like activity that had just occurred  Ativan 1 mg via IV given, per verbal order from Dr. Tamera Punt Patient remains on cardiac monitor with seizure pads in place

## 2014-10-02 NOTE — ED Notes (Signed)
MD at bedside. EDP BELFIE PRESENT TO [facility] THIS PT

## 2014-10-02 NOTE — ED Notes (Signed)
RT called and made aware of order for ABG

## 2014-10-02 NOTE — ED Provider Notes (Signed)
CSN: [id number]     Arrival date & time 10/02/14  1455 History   First MD Initiated Contact with Patient 10/02/14 1515     Chief Complaint  Patient presents with  . Fall  . Hip Pain  . Weakness  . Seizures     (Consider location/radiation/quality/duration/timing/severity/associated sxs/prior Treatment) HPI Comments: Pt presents with left hip pain after fall.  He has had two falls this am.  He has a hx of DM, cerebral aneurysm s/p coiling at [facility] in 2002, SDH s/p evacuation in 2014 with subsequent seizures, on Keppra.  He states that he fell twice today while trying to get out of bed. He states that both times he had to urinate and was trying to rapidly get out of bed and got tangled up in the sheets. However his wife states that with this last fall there was seizure activity present during the fall. It's unclear if this is what led to the fall. Patient is unsure. He did fall onto the floor and hit a table apparently. Reportedly a lamp fell onto him. He denies any definite head trauma although there is a small abrasion area to his forehead. He mostly complains of pain to his left hip. He denies any tongue injury. He denies any other injuries from the fall. He denies any neck or back pain. He has some mild left facial drooping which is chronic from his past subdural hematoma. He denies any recent illnesses. He denies any increased weakness from his baseline. He denies any chest pain or shortness of breath. He denies any fevers cough congestion urinary difficulties or other recent illnesses.  Patient is a 81 y.o. male presenting with fall, hip pain, weakness, and seizures.  Fall Pertinent negatives include no chest pain, no abdominal pain, no headaches and no shortness of breath.  Hip Pain Pertinent negatives include no chest pain, no abdominal pain, no headaches and no shortness of breath.  Weakness Pertinent negatives include no chest pain, no abdominal pain, no headaches and no shortness of  breath.  Seizures   Past Medical History  Diagnosis Date  . Hypertension   . Colonic polyp 2003  . GERD (gastroesophageal reflux disease)   . ED (erectile dysfunction)   . Hyperlipidemia   . Hypothyroidism   . Brain aneurysm   . Seizures     per family  . Diabetes mellitus without complication    Past Surgical History  Procedure Laterality Date  . Brain aneurysm surgery      at [facility]  . Carotid artery aneurysm    . Colonoscopy      polyps  . Right hernia    . Craniotomy Right 09/19/2012    Procedure: CRANIOTOMY HEMATOMA EVACUATION SUBDURAL;  Surgeon: Otilio Connors, MD;  Location: [place] [facility];  Service: Neurosurgery;  Laterality: Right;  . Craniotomy Right 09/22/2012    Procedure: CRANIOTOMY HEMATOMA EVACUATION SUBDURAL;  Surgeon: Otilio Connors, MD;  Location: [place] [facility];  Service: Neurosurgery;  Laterality: Right;   Family History  Problem Relation Age of Onset  . Liver disease Brother     Died   History  Substance Use Topics  . Smoking status: Never Smoker   . Smokeless tobacco: Never Used  . Alcohol Use: 0.0 oz/week    0 Standard drinks or equivalent per week     Comment: rum mixed in diet coke 3 a week    Review of Systems  Constitutional: Negative for fever, chills, diaphoresis and fatigue.  HENT: Negative for congestion, rhinorrhea and sneezing.   Eyes: Negative.   Respiratory: Negative for cough, chest tightness and shortness of breath.   Cardiovascular: Negative for chest pain and leg swelling.  Gastrointestinal: Negative for nausea, vomiting, abdominal pain, diarrhea and blood in stool.  Genitourinary: Negative for frequency, hematuria, flank pain and difficulty urinating.  Musculoskeletal: Positive for arthralgias. Negative for back pain.  Skin: Negative for rash.  Neurological: Positive for seizures (possible). Negative for dizziness, speech difficulty, weakness, numbness and headaches.      Allergies  Review of patient's allergies indicates  no known allergies.  Home Medications   Prior to Admission medications   Medication Sig Start Date End Date Taking? Authorizing Provider  levETIRAcetam (KEPPRA) 750 MG tablet Take 2 tablets (1,500 mg total) by mouth 2 (two) times daily. 06/04/14  Yes Donika Keith Rake, DO  levothyroxine (SYNTHROID, LEVOTHROID) 50 MCG tablet Take 1 tablet (50 mcg total) by mouth daily before breakfast. 04/14/14  Yes Dorena Cookey, MD  metFORMIN (GLUCOPHAGE) 500 MG tablet TAKE 1 TABLET BY MOUTH 2 TIMES DAILY WITH A MEAL. Patient taking differently: Take 500 mg by mouth 2 (two) times daily with a meal.  04/14/14  Yes Dorena Cookey, MD  Multiple Vitamin (MULTIVITAMIN WITH MINERALS) TABS tablet Take 1 tablet by mouth 2 (two) times daily.    Yes Historical Provider, MD  omeprazole (PRILOSEC) 20 MG capsule Take 1 capsule (20 mg total) by mouth every Monday, Wednesday, and Friday. 04/14/14  Yes Dorena Cookey, MD  simvastatin (ZOCOR) 10 MG tablet Take 1 tablet (10 mg total) by mouth at bedtime. 04/14/14  Yes Dorena Cookey, MD  ONE TOUCH LANCETS MISC 1 each by Does not apply route daily. 06/27/11   Dorena Cookey, MD   BP 144/98 mmHg  Pulse 86  Resp 25  Ht 6\' 1"  (1.854 m)  Wt 186 lb (84.369 kg)  BMI 24.55 kg/m2  SpO2 96% Physical Exam  Constitutional: He is oriented to person, place, and time. He appears well-developed and well-nourished.  HENT:  Head: Normocephalic and atraumatic.  No tongue trauma  Eyes: Pupils are equal, round, and reactive to light.  Neck: Normal range of motion. Neck supple.  Cardiovascular: Normal rate, regular rhythm and normal heart sounds.   Pulmonary/Chest: Effort normal and breath sounds normal. No respiratory distress. He has no wheezes. He has no rales. He exhibits no tenderness.  Abdominal: Soft. Bowel sounds are normal. There is no tenderness. There is no rebound and no guarding.  Musculoskeletal: Normal range of motion. He exhibits no edema.  Positive significant ecchymosis to  his buttocks area bilaterally and around his right flank. There is ecchymosis across his lower back bilaterally. He has no pain to the cervical thoracic spine. There some mild tenderness to the lower lumbosacral spine with some overlying ecchymosis. There is pain on range of motion of the left hip. There is no pain to the knee or ankle. No swelling or deformity is noted to the leg. Pedal pulses are intact. There is no other pain on palpation or range of motion of extremities.  Lymphadenopathy:    He has no cervical adenopathy.  Neurological: He is alert and oriented to person, place, and time.  Pt has some slight drooping of the corner of the left mouth.  Other CN intact.  Motor 5/5 all extremities.  Sensation intact to LT all extremities.  FTN intact.  No pronator drift.  Skin: Skin is warm and dry. No  rash noted.  Psychiatric: He has a normal mood and affect.    ED Course  Procedures (including critical care time) Labs Review Labs Reviewed  BASIC METABOLIC PANEL - Abnormal; Notable for the following:    Glucose, Bld 157 (*)    GFR calc non Af Amer 80 (*)    All other components within normal limits  CBC WITH DIFFERENTIAL/PLATELET - Abnormal; Notable for the following:    Hemoglobin 12.0 (*)    HCT 38.0 (*)    MCH 25.1 (*)    RDW 15.8 (*)    Monocytes Absolute 1.1 (*)    All other components within normal limits  LACTIC ACID, PLASMA  LEVETIRACETAM LEVEL  PROLACTIN  URINE RAPID DRUG SCREEN (HOSP PERFORMED)  ETHANOL  BLOOD GAS, ARTERIAL  OSMOLALITY  HEMOGLOBIN A1C  URINALYSIS W MICROSCOPIC  TSH    Imaging Review Dg Lumbar Spine Complete  10/02/2014   CLINICAL DATA:  [place] at home getting out of bed today, low back pain, hip pain on the left  EXAM: LUMBAR SPINE - COMPLETE 4+ VIEW  COMPARISON:  Or 03/2013  FINDINGS: Minimal anterior listhesis of L4 on L5 due to degenerative facet change. No fracture. From L2-3 through L5-S1 there is significant degenerative facet change. There is no  fracture. Heavy calcification of the aortoiliac vessels.  IMPRESSION: Significant degenerative change with no acute findings   Electronically Signed   By: Skipper Cliche M.D.   On: 10/02/2014 16:13   Ct Head Wo Contrast  10/02/2014   CLINICAL DATA:  Patient status post fall. History of seizures. Weakness.  EXAM: CT HEAD WITHOUT CONTRAST  TECHNIQUE: Contiguous axial images were obtained from the base of the skull through the vertex without intravenous contrast.  COMPARISON:  Brain CT 02/05/2014; brain MR 02/07/2014  FINDINGS: Ventricles and sulci are prominent, compatible with atrophy. There is sequelae of multiple right-sided craniotomies. Unchanged chronic coarsely calcified right clinoid region mass. No significant mass-effect, evidence for acute cortically based infarct or intracranial hemorrhage. Periventricular and subcortical white matter hypodensity. Orbits are unremarkable. Polypoid mucosal thickening left maxillary sinus. Mastoid air cells are well aerated.  IMPRESSION: No acute intracranial process.  Re- demonstrated chronic right supraclinoid mass.  Chronic small vessel ischemic changes.   Electronically Signed   By: Lovey Newcomer M.D.   On: 10/02/2014 16:42   Dg Hip Unilat With Pelvis 2-3 Views Left  10/02/2014   CLINICAL DATA:  81 year old male with a history of fall. Left hip pain.  EXAM: LEFT HIP (WITH PELVIS) 2-3 VIEWS  COMPARISON:  Contemporaneous plain film of the lumbar spine. Prior abdominal plain film 09/25/2012  FINDINGS: Bony pelvic ring is intact with no acute bony abnormality identified. Bilateral hips projects normally over the acetabula.  Proximal aspects of the bilateral femurs unremarkable.  No displaced fracture of the proximal left femur identified. Left hip appears congruent.  Mild changes of osteoarthritis.  Incidental note made of transitional vertebral body of L6/S1, with likely ankylosis of the right L6 transverse process with the iliac bone.  IMPRESSION: No acute bony  abnormality identified.  Signed,  Dulcy Fanny. Earleen Newport, DO  [facility]  [facility]   Electronically Signed   By: Corrie Mckusick D.O.   On: 10/02/2014 16:16     EKG Interpretation   Date/Time:  Saturday October 02 2014 15:11:49 EDT Ventricular Rate:  95 PR Interval:  191 QRS Duration: 93 QT Interval:  352 QTC Calculation: 442 R Axis:   -15 Text  Interpretation:  Sinus rhythm Atrial premature complex Inferior  infarct, old since last tracing no significant change Confirmed by Shelda Truby   MD, Yakov Bergen [phone]) on 10/02/2014 3:16:26 PM      MDM   Final diagnoses:  Back pain  Contusion, hip and thigh, left, initial encounter  Seizure    I spoke to the patient's wife and she states that the patient had 2 seizures during the day today with a resulting falls. The last seizure these had was about 6 months ago. He is on Keppra 1500 mg twice a day. She states that he's been taking his medication regularly. He is at his baseline mental status. He has no history of dementia. I spoke with Dr. Armida Sans with the neuro hospitalist service who recommended loading the patient with IV Depakote and discharging the patient on Depakote 500 mg 3 times a day in addition to his Keppra.  After the IV Depakote was infused, patient had a generalized tonic-clonic type seizure lasting about 15 seconds. He was noncommunicative during the seizure activity. He was given dose of IV Ativan. At this point given that this is third seizure today I will admit him. I spoke with [name] who will admit the patient to telemetry. I also spoke with Dr. Nicole Kindred with the neuro hospitalist service who felt that it was okay for the patient to stay at [facility] long for admission.    Malvin Johns, MD 10/02/14 2021

## 2014-10-02 NOTE — H&P (Addendum)
[facility] History and Physical  Patient: Gerald Leach  MRN: [id number]  DOB: June 21, 1933  DOS: the patient was seen and examined on 10/02/2014 PCP: [name], MD  Chief Complaint: Fall  HPI: Gerald Leach is a 81 y.o. male with Past medical history of seizure disorder with grand mal seizures after subdural hematoma, essential hypertension, hypothyroidism, diabetes mellitus. The patient is presenting with fall. Patient mentions that he was at his baseline earlier in the morning at 1:30 when he woke up to go to the restroom he suddenly lost his balance and fell down. He did not lose any consciousness did not hit his head or neck. After this fall the patient was again able to ambulate but around afternoon 1 PM he had another fall. Patient mentions that a wire wrapped around his ankle on both times and he fell. Patient's wife reportedly mention to him that he was having seizure like episode. Patient at the time of my evaluation denies any complaint of headache, blurring of the vision, speech difficulty, neck pain, recent cough or runny nose, nausea or vomiting, abdominal pain, chest pain, diarrhea, constipation, burning urination. He mentions he has been compliant with all his medication and has taken Keppra in the morning. While the patient was here in the ER he was given an extra dose of Depakote after discussing with neurology and was recommended to send home on Depakote along with Keppra. After receiving the Depakote the patient had another episode of seizure like activity without any tongue bite or loss of control of bowel or bladder. This was witnessed by RN as well as ED physician. Patient was given Ativan but the episode lasted for a few seconds and was reportedly at back his baseline prior to Ativan.  The patient is coming from home And at his baseline independent for most of his ADL.  Review of Systems: as mentioned in the history of present illness.  A comprehensive  review of the other systems is negative.  Past Medical History  Diagnosis Date  . Hypertension   . Colonic polyp 2003  . GERD (gastroesophageal reflux disease)   . ED (erectile dysfunction)   . Hyperlipidemia   . Hypothyroidism   . Brain aneurysm   . Seizures     per family  . Diabetes mellitus without complication    Past Surgical History  Procedure Laterality Date  . Brain aneurysm surgery      at [facility]  . Carotid artery aneurysm    . Colonoscopy      polyps  . Right hernia    . Craniotomy Right 09/19/2012    Procedure: CRANIOTOMY HEMATOMA EVACUATION SUBDURAL;  Surgeon: Otilio Connors, MD;  Location: [facility];  Service: Neurosurgery;  Laterality: Right;  . Craniotomy Right 09/22/2012    Procedure: CRANIOTOMY HEMATOMA EVACUATION SUBDURAL;  Surgeon: Otilio Connors, MD;  Location: [facility];  Service: Neurosurgery;  Laterality: Right;   Social History:  reports that he has never smoked. He has never used smokeless tobacco. He reports that he drinks alcohol. He reports that he does not use illicit drugs.  No Known Allergies  Family History  Problem Relation Age of Onset  . Liver disease Brother     Died    Prior to Admission medications   Medication Sig Start Date End Date Taking? Authorizing Provider  levETIRAcetam (KEPPRA) 750 MG tablet Take 2 tablets (1,500 mg total) by mouth 2 (two) times daily. 06/04/14  Yes Alda Berthold,  DO  levothyroxine (SYNTHROID, LEVOTHROID) 50 MCG tablet Take 1 tablet (50 mcg total) by mouth daily before breakfast. 04/14/14  Yes Dorena Cookey, MD  metFORMIN (GLUCOPHAGE) 500 MG tablet TAKE 1 TABLET BY MOUTH 2 TIMES DAILY WITH A MEAL. Patient taking differently: Take 500 mg by mouth 2 (two) times daily with a meal.  04/14/14  Yes Dorena Cookey, MD  Multiple Vitamin (MULTIVITAMIN WITH MINERALS) TABS tablet Take 1 tablet by mouth 2 (two) times daily.    Yes Historical Provider, MD  omeprazole (PRILOSEC) 20 MG capsule Take 1 capsule (20 mg  total) by mouth every Monday, Wednesday, and Friday. 04/14/14  Yes Dorena Cookey, MD  simvastatin (ZOCOR) 10 MG tablet Take 1 tablet (10 mg total) by mouth at bedtime. 04/14/14  Yes Dorena Cookey, MD  ONE TOUCH LANCETS MISC 1 each by Does not apply route daily. 06/27/11   Dorena Cookey, MD    Physical Exam: Filed Vitals:   10/02/14 1459 10/02/14 1718 10/02/14 1830 10/02/14 1935  BP: 157/91 144/79 147/97 160/93  Pulse: 107 76 81 85  Resp: 18 18 22 28   Height: 6\' 1"  (1.854 m)     Weight: 84.369 kg (186 lb)     SpO2: 96% 100% 95% 96%    General: Alert, Awake and Oriented to Time, Place and Person. Appear in mild distress Eyes: PERRL ENT: Oral Mucosa clear dry. Neck: no JVD Cardiovascular: S1 and S2 Present, no Murmur, Peripheral Pulses Present Respiratory: Bilateral Air entry equal and Decreased,  Clear to Auscultation, noCrackles, no wheezes Abdomen: Bowel Sound present, Soft and non tender Skin: no Rash Extremities: Bilateral Pedal edema, no calf tenderness Neurologic: Grossly no focal neuro deficit.  Labs on Admission:  CBC:  Recent Labs Lab 10/02/14 1615  WBC 9.7  NEUTROABS 7.1  HGB 12.0*  HCT 38.0*  MCV 79.5  PLT 259    CMP     Component Value Date/Time   NA 138 10/02/2014 1615   K 3.9 10/02/2014 1615   CL 104 10/02/2014 1615   CO2 25 10/02/2014 1615   GLUCOSE 157* 10/02/2014 1615   BUN 14 10/02/2014 1615   CREATININE 0.89 10/02/2014 1615   CALCIUM 8.5 10/02/2014 1615   PROT 7.1 03/25/2014 1023   PROT 7.5 03/31/2013 1535   ALBUMIN 3.4* 03/25/2014 1023   AST 19 03/25/2014 1023   ALT 14 03/25/2014 1023   ALKPHOS 69 03/25/2014 1023   BILITOT 0.8 03/25/2014 1023   GFRNONAA 80* 10/02/2014 1615   GFRAA >90 10/02/2014 1615    No results for input(s): LIPASE, AMYLASE in the last 168 hours.  No results for input(s): CKTOTAL, CKMB, CKMBINDEX, TROPONINI in the last 168 hours. BNP (last 3 results) No results for input(s): BNP in the last 8760  hours.  ProBNP (last 3 results) No results for input(s): PROBNP in the last 8760 hours.   Radiological Exams on Admission: Dg Lumbar Spine Complete  10/02/2014   CLINICAL DATA:  [address] at home getting out of bed today, low back pain, hip pain on the left  EXAM: LUMBAR SPINE - COMPLETE 4+ VIEW  COMPARISON:  Or 03/2013  FINDINGS: Minimal anterior listhesis of L4 on L5 due to degenerative facet change. No fracture. From L2-3 through L5-S1 there is significant degenerative facet change. There is no fracture. Heavy calcification of the aortoiliac vessels.  IMPRESSION: Significant degenerative change with no acute findings   Electronically Signed   By: Skipper Cliche M.D.   On:  10/02/2014 16:13   Ct Head Wo Contrast  10/02/2014   CLINICAL DATA:  Patient status post fall. History of seizures. Weakness.  EXAM: CT HEAD WITHOUT CONTRAST  TECHNIQUE: Contiguous axial images were obtained from the base of the skull through the vertex without intravenous contrast.  COMPARISON:  Brain CT 02/05/2014; brain MR 02/07/2014  FINDINGS: Ventricles and sulci are prominent, compatible with atrophy. There is sequelae of multiple right-sided craniotomies. Unchanged chronic coarsely calcified right clinoid region mass. No significant mass-effect, evidence for acute cortically based infarct or intracranial hemorrhage. Periventricular and subcortical white matter hypodensity. Orbits are unremarkable. Polypoid mucosal thickening left maxillary sinus. Mastoid air cells are well aerated.  IMPRESSION: No acute intracranial process.  Re- demonstrated chronic right supraclinoid mass.  Chronic small vessel ischemic changes.   Electronically Signed   By: Lovey Newcomer M.D.   On: 10/02/2014 16:42   Dg Hip Unilat With Pelvis 2-3 Views Left  10/02/2014   CLINICAL DATA:  81 year old male with a history of fall. Left hip pain.  EXAM: LEFT HIP (WITH PELVIS) 2-3 VIEWS  COMPARISON:  Contemporaneous plain film of the lumbar spine. Prior abdominal  plain film 09/25/2012  FINDINGS: Bony pelvic ring is intact with no acute bony abnormality identified. Bilateral hips projects normally over the acetabula.  Proximal aspects of the bilateral femurs unremarkable.  No displaced fracture of the proximal left femur identified. Left hip appears congruent.  Mild changes of osteoarthritis.  Incidental note made of transitional vertebral body of L6/S1, with likely ankylosis of the right L6 transverse process with the iliac bone.  IMPRESSION: No acute bony abnormality identified.  Signed,  Dulcy Fanny. Earleen Newport, DO  [facility]  [facility]   Electronically Signed   By: Corrie Mckusick D.O.   On: 10/02/2014 16:16   EKG: Independently reviewed. nonspecific ST and T waves changes, sinus arrhythmia.  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   1. Seizure disorder, grand mal  CNS aneurysm, CNS meningioma versus thrombosed aneurysm T7 stenosis  The patient is presenting with complaints of recurrent fall. There was a reported history of seizure-like activity associated with the fall. Patient had a seizure-like activity while he was here in the ER. At the time of my evaluation the patient is at his baseline and does not have any focal deficit on exam. CT of the head is negative for any acute abnormality. We will get further workup to identify the etiology and presence of seizure. Patient will be admitted in the hospital on telemetry. Seizure prophylaxis, lorazepam as needed, and tinea with Keppra as well as continue with Depakote per neurology.  2. Hypothyroidism. Check TSH and continue Synthroid.  3.  Diabetes mellitus Last hemoglobin A1c 10/15 6.3. Holding metformin and placing him on sliding scale.  4. GERD. Continuing PPI.  5. Dyslipidemia. Continuing simvastatin.  6. Fall. Etiology unclear possible seizure. Patient appears dehydrated with third spacing leading to pedal edema and has generalized weakness. PTOT will be involved in the morning. We will follow the current workup to identify the etiology of generalized weakness.  Advance goals of care discussion: Full code   Consults: Phone consultation with neurology  DVT Prophylaxis: subcutaneous Heparin Nutrition: Advance as tolerated, diabetic diet  Disposition: Admitted as inpatient, telemetry unit.  Author: Berle Mull, MD [facility] Pager: [phone] 10/02/2014   Addendum: After evaluating the patient Neurology recommends to rule out acute stroke. Patient will be placed on nothing by mouth with speech therapy evaluation to assess swallowing per core measures. Insulin sliding scale changed to every 6 hours. Other Medications changed to IV as well. Continue serial neuro checks.  Jesson Foskey 9:48 PM 10/02/2014    If 7PM-7AM, please contact night-coverage [email or web address] Password [email or web address]

## 2014-10-02 NOTE — ED Notes (Signed)
Neuro consult at bedside Delay in taking patient up to floor

## 2014-10-02 NOTE — Consult Note (Addendum)
Admission H&P    Chief Complaint: Recurrent generalized seizures.  HPI: Gerald Leach is an 81 y.o. male history of hypertension, hyperlipidemia, hypothyroidism, subdural hematoma with surgical evacuation and subsequent seizure disorder, diabetes mellitus and right ICA aneurysm coiling in 2002, presenting following a witnessed recurrent seizure at home. Patient has been taking Keppra 1500 mg twice a day. CT scan of his head showed no new findings. He was given a loading dose of Depakote 1000 mg IV with plan to continue at 500 mg twice a day. Patient had a recurrent witnessed generalized seizure in the emergency room. No focal deficits were noted. He is being admitted for overnight observation and acute seizure management.  Past Medical History  Diagnosis Date  . Hypertension   . Colonic polyp 2003  . GERD (gastroesophageal reflux disease)   . ED (erectile dysfunction)   . Hyperlipidemia   . Hypothyroidism   . Brain aneurysm   . Seizures     per family  . Diabetes mellitus without complication     Past Surgical History  Procedure Laterality Date  . Brain aneurysm surgery      at [facility]  . Carotid artery aneurysm    . Colonoscopy      polyps  . Right hernia    . Craniotomy Right 09/19/2012    Procedure: CRANIOTOMY HEMATOMA EVACUATION SUBDURAL;  Surgeon: Otilio Connors, MD;  Location: [facility];  Service: Neurosurgery;  Laterality: Right;  . Craniotomy Right 09/22/2012    Procedure: CRANIOTOMY HEMATOMA EVACUATION SUBDURAL;  Surgeon: Otilio Connors, MD;  Location: [place] [facility];  Service: Neurosurgery;  Laterality: Right;    Family History  Problem Relation Age of Onset  . Liver disease Brother     Died   Social History:  reports that he has never smoked. He has never used smokeless tobacco. He reports that he drinks alcohol. He reports that he does not use illicit drugs.  Allergies: No Known Allergies  Medications: Patient's current medications were reviewed by  me.  ROS: History obtained from chart review and the patient  General ROS: negative for - chills, fatigue, fever, night sweats, weight gain or weight loss Psychological ROS: negative for - behavioral disorder, hallucinations, memory difficulties, mood swings or suicidal ideation Ophthalmic ROS: negative for - blurry vision, double vision, eye pain or loss of vision ENT ROS: negative for - epistaxis, nasal discharge, oral lesions, sore throat, tinnitus or vertigo Allergy and Immunology ROS: negative for - hives or itchy/watery eyes Hematological and Lymphatic ROS: negative for - bleeding problems, bruising or swollen lymph nodes Endocrine ROS: negative for - galactorrhea, hair pattern changes, polydipsia/polyuria or temperature intolerance Respiratory ROS: negative for - cough, hemoptysis, shortness of breath or wheezing Cardiovascular ROS: negative for - chest pain, dyspnea on exertion, edema or irregular heartbeat Gastrointestinal ROS: negative for - abdominal pain, diarrhea, hematemesis, nausea/vomiting or stool incontinence Genito-Urinary ROS: negative for - dysuria, hematuria, incontinence or urinary frequency/urgency Musculoskeletal ROS: negative for - joint swelling or muscular weakness Neurological ROS: as noted in HPI Dermatological ROS: negative for rash and skin lesion changes  Physical Examination: Blood pressure 144/98, pulse 77, resp. rate 17, height '6\' 1"'  (1.854 m), weight 84.369 kg (186 lb), SpO2 97 %.  HEENT-  Normocephalic, no lesions, without obvious abnormality.  Normal external eye and conjunctiva.  Normal TM's bilaterally.  Normal auditory canals and external ears. Normal external nose, mucus membranes and septum.  Normal pharynx. Neck supple with no masses, nodes, nodules  or enlargement. Cardiovascular - regular rate and rhythm, S1, S2 normal, no murmur, click, rub or gallop Lungs - chest clear, no wheezing, rales, normal symmetric air entry Abdomen - soft,  non-tender; bowel sounds normal; no masses,  no organomegaly Extremities - no joint deformities, effusion, or inflammation, no edema and no skin discoloration   Neurologic Examination: Mental Status: Alert, oriented, thought content appropriate.  Speech minimally slurred without evidence of aphasia. Able to follow commands without difficulty. Cranial Nerves: II-Visual fields were normal. III/IV/VI-Pupils were equal and reacted to light. Extraocular movements were full and conjugate.    V/VII-no facial numbness and no facial weakness. VIII-normal. X-minimal dysarthria; symmetrical palatal movement. XI: trapezius strength/neck flexion strength normal bilaterally XII-midline tongue extension with normal strength. Motor: 5/5 bilaterally with normal tone and bulk Sensory: Normal throughout. Deep Tendon Reflexes: 1+ and symmetric. Plantars: Mute bilaterally Cerebellar: Normal finger-to-nose testing. Carotid auscultation: Normal  Results for orders placed or performed during the hospital encounter of 10/02/14 (from the past 48 hour(s))  Basic metabolic panel     Status: Abnormal   Collection Time: 10/02/14  4:15 PM  Result Value Ref Range   Sodium 138 135 - 145 mmol/L   Potassium 3.9 3.5 - 5.1 mmol/L   Chloride 104 96 - 112 mmol/L   CO2 25 19 - 32 mmol/L   Glucose, Bld 157 (H) 70 - 99 mg/dL   BUN 14 6 - 23 mg/dL   Creatinine, Ser 0.89 0.50 - 1.35 mg/dL   Calcium 8.5 8.4 - 10.5 mg/dL   GFR calc non Af Amer 80 (L) >90 mL/min   GFR calc Af Amer >90 >90 mL/min    Comment: (NOTE) The eGFR has been calculated using the CKD EPI equation. This calculation has not been validated in all clinical situations. eGFR's persistently <90 mL/min signify possible Chronic Kidney Disease.    Anion gap 9 5 - 15  CBC with Differential     Status: Abnormal   Collection Time: 10/02/14  4:15 PM  Result Value Ref Range   WBC 9.7 4.0 - 10.5 K/uL   RBC 4.78 4.22 - 5.81 MIL/uL   Hemoglobin 12.0 (L) 13.0 -  17.0 g/dL   HCT 38.0 (L) 39.0 - 52.0 %   MCV 79.5 78.0 - 100.0 fL   MCH 25.1 (L) 26.0 - 34.0 pg   MCHC 31.6 30.0 - 36.0 g/dL   RDW 15.8 (H) 11.5 - 15.5 %   Platelets 259 150 - 400 K/uL   Neutrophils Relative % 74 43 - 77 %   Neutro Abs 7.1 1.7 - 7.7 K/uL   Lymphocytes Relative 15 12 - 46 %   Lymphs Abs 1.5 0.7 - 4.0 K/uL   Monocytes Relative 11 3 - 12 %   Monocytes Absolute 1.1 (H) 0.1 - 1.0 K/uL   Eosinophils Relative 0 0 - 5 %   Eosinophils Absolute 0.0 0.0 - 0.7 K/uL   Basophils Relative 0 0 - 1 %   Basophils Absolute 0.0 0.0 - 0.1 K/uL  Lactic acid, plasma     Status: Abnormal   Collection Time: 10/02/14  8:14 PM  Result Value Ref Range   Lactic Acid, Venous 2.7 (HH) 0.5 - 2.0 mmol/L    Comment: REPEATED TO VERIFY CRITICAL RESULT CALLED TO, READ BACK BY AND VERIFIED WITH: POSTEN,E/ED '@2115'  ON 10/02/14 BY KARCZEWSKI,S.   Blood gas, arterial     Status: Abnormal   Collection Time: 10/02/14  8:30 PM  Result Value Ref Range  FIO2 0.21 %   pH, Arterial 7.467 (H) 7.350 - 7.450   pCO2 arterial 33.0 (L) 35.0 - 45.0 mmHg   pO2, Arterial 81.3 80.0 - 100.0 mmHg   Bicarbonate 23.5 20.0 - 24.0 mEq/L   TCO2 21.1 0 - 100 mmol/L   Acid-Base Excess 0.7 0.0 - 2.0 mmol/L   O2 Saturation 95.9 %   Patient temperature 98.6    Collection site RIGHT RADIAL    Drawn by [id number]    Sample type ARTERIAL DRAW    Allens test (pass/fail) PASS PASS   Dg Lumbar Spine Complete  10/02/2014   CLINICAL DATA:  [address] at home getting out of bed today, low back pain, hip pain on the left  EXAM: LUMBAR SPINE - COMPLETE 4+ VIEW  COMPARISON:  Or 03/2013  FINDINGS: Minimal anterior listhesis of L4 on L5 due to degenerative facet change. No fracture. From L2-3 through L5-S1 there is significant degenerative facet change. There is no fracture. Heavy calcification of the aortoiliac vessels.  IMPRESSION: Significant degenerative change with no acute findings   Electronically Signed   By: Skipper Cliche M.D.   On:  10/02/2014 16:13   Ct Head Wo Contrast  10/02/2014   CLINICAL DATA:  Patient status post fall. History of seizures. Weakness.  EXAM: CT HEAD WITHOUT CONTRAST  TECHNIQUE: Contiguous axial images were obtained from the base of the skull through the vertex without intravenous contrast.  COMPARISON:  Brain CT 02/05/2014; brain MR 02/07/2014  FINDINGS: Ventricles and sulci are prominent, compatible with atrophy. There is sequelae of multiple right-sided craniotomies. Unchanged chronic coarsely calcified right clinoid region mass. No significant mass-effect, evidence for acute cortically based infarct or intracranial hemorrhage. Periventricular and subcortical white matter hypodensity. Orbits are unremarkable. Polypoid mucosal thickening left maxillary sinus. Mastoid air cells are well aerated.  IMPRESSION: No acute intracranial process.  Re- demonstrated chronic right supraclinoid mass.  Chronic small vessel ischemic changes.   Electronically Signed   By: Lovey Newcomer M.D.   On: 10/02/2014 16:42   Dg Hip Unilat With Pelvis 2-3 Views Left  10/02/2014   CLINICAL DATA:  81 year old male with a history of fall. Left hip pain.  EXAM: LEFT HIP (WITH PELVIS) 2-3 VIEWS  COMPARISON:  Contemporaneous plain film of the lumbar spine. Prior abdominal plain film 09/25/2012  FINDINGS: Bony pelvic ring is intact with no acute bony abnormality identified. Bilateral hips projects normally over the acetabula.  Proximal aspects of the bilateral femurs unremarkable.  No displaced fracture of the proximal left femur identified. Left hip appears congruent.  Mild changes of osteoarthritis.  Incidental note made of transitional vertebral body of L6/S1, with likely ankylosis of the right L6 transverse process with the iliac bone.  IMPRESSION: No acute bony abnormality identified.  Signed,  Dulcy Fanny. Earleen Newport, DO  [facility]  [facility]   Electronically Signed   By: Corrie Mckusick D.O.   On:  10/02/2014 16:16    Assessment/Plan 81 year old man with recurrent breakthrough generalized seizures, etiology is unclear. Mental status is back to baseline at this point. He has no focal findings. CT scan showed no acute intracranial abnormality.  Recommendations: 1. Stat Depakote level. 2. Will plan to adjust Depakote based on level obtained. If within normal range, additional AED will be prescribed if patient has no seizure. 3. Continue Keppra at 1500 mg twice a day. 4. MRI of the brain without contrast to rule out acute stroke.  We will continue to follow this  patient with you.  C.R. Nicole Kindred, [place] [facility] [phone]  10/02/2014, 9:23 PM

## 2014-10-02 NOTE — ED Notes (Signed)
Pt ambulated in the hallway with minimal assistance. The nurse and I walked him from the room to the nurses station and back and he barely put any weight on [place], but we were there for support if he needed it. Pt also walked with out any assistance a few feet inside his room.

## 2014-10-02 NOTE — ED Notes (Signed)
Patient transported to X-ray 

## 2014-10-03 ENCOUNTER — Inpatient Hospital Stay (HOSPITAL_COMMUNITY): Payer: Medicare Other

## 2014-10-03 DIAGNOSIS — G40909 Epilepsy, unspecified, not intractable, without status epilepticus: Secondary | ICD-10-CM

## 2014-10-03 LAB — CBC WITH DIFFERENTIAL/PLATELET
Basophils Absolute: 0 10*3/uL (ref 0.0–0.1)
Basophils Relative: 0 % (ref 0–1)
EOS PCT: 1 % (ref 0–5)
Eosinophils Absolute: 0 10*3/uL (ref 0.0–0.7)
HCT: 36.4 % — ABNORMAL LOW (ref 39.0–52.0)
Hemoglobin: 11.4 g/dL — ABNORMAL LOW (ref 13.0–17.0)
LYMPHS ABS: 2.3 10*3/uL (ref 0.7–4.0)
LYMPHS PCT: 28 % (ref 12–46)
MCH: 25 pg — ABNORMAL LOW (ref 26.0–34.0)
MCHC: 31.3 g/dL (ref 30.0–36.0)
MCV: 79.8 fL (ref 78.0–100.0)
MONO ABS: 0.9 10*3/uL (ref 0.1–1.0)
MONOS PCT: 11 % (ref 3–12)
NEUTROS ABS: 4.9 10*3/uL (ref 1.7–7.7)
Neutrophils Relative %: 60 % (ref 43–77)
PLATELETS: 230 10*3/uL (ref 150–400)
RBC: 4.56 MIL/uL (ref 4.22–5.81)
RDW: 16 % — AB (ref 11.5–15.5)
WBC: 8.1 10*3/uL (ref 4.0–10.5)

## 2014-10-03 LAB — COMPREHENSIVE METABOLIC PANEL
ALT: 16 U/L (ref 0–53)
ANION GAP: 5 (ref 5–15)
AST: 18 U/L (ref 0–37)
Albumin: 3.3 g/dL — ABNORMAL LOW (ref 3.5–5.2)
Alkaline Phosphatase: 64 U/L (ref 39–117)
BUN: 10 mg/dL (ref 6–23)
CHLORIDE: 105 mmol/L (ref 96–112)
CO2: 25 mmol/L (ref 19–32)
Calcium: 8 mg/dL — ABNORMAL LOW (ref 8.4–10.5)
Creatinine, Ser: 0.78 mg/dL (ref 0.50–1.35)
GFR calc non Af Amer: 84 mL/min — ABNORMAL LOW (ref >90)
Glucose, Bld: 123 mg/dL — ABNORMAL HIGH (ref 70–99)
POTASSIUM: 3.6 mmol/L (ref 3.5–5.1)
Sodium: 135 mmol/L (ref 135–145)
TOTAL PROTEIN: 6.4 g/dL (ref 6.0–8.3)
Total Bilirubin: 1 mg/dL (ref 0.3–1.2)

## 2014-10-03 LAB — URINALYSIS, ROUTINE W REFLEX MICROSCOPIC
BILIRUBIN URINE: NEGATIVE
Glucose, UA: NEGATIVE mg/dL
HGB URINE DIPSTICK: NEGATIVE
KETONES UR: 15 mg/dL — AB
Leukocytes, UA: NEGATIVE
Nitrite: NEGATIVE
Protein, ur: NEGATIVE mg/dL
SPECIFIC GRAVITY, URINE: 1.018 (ref 1.005–1.030)
UROBILINOGEN UA: 1 mg/dL (ref 0.0–1.0)
pH: 7.5 (ref 5.0–8.0)

## 2014-10-03 LAB — OSMOLALITY: Osmolality: 288 mOsm/kg (ref 275–300)

## 2014-10-03 LAB — GLUCOSE, CAPILLARY
Glucose-Capillary: 119 mg/dL — ABNORMAL HIGH (ref 70–99)
Glucose-Capillary: 105 mg/dL — ABNORMAL HIGH (ref 70–99)
Glucose-Capillary: 103 mg/dL — ABNORMAL HIGH (ref 70–99)
Glucose-Capillary: 108 mg/dL — ABNORMAL HIGH (ref 70–99)

## 2014-10-03 LAB — PROTIME-INR
INR: 1.06 (ref 0.00–1.49)
Prothrombin Time: 13.9 seconds (ref 11.6–15.2)

## 2014-10-03 LAB — MAGNESIUM: MAGNESIUM: 2 mg/dL (ref 1.5–2.5)

## 2014-10-03 LAB — VALPROIC ACID LEVEL: VALPROIC ACID LVL: 37.3 ug/mL — AB (ref 50.0–100.0)

## 2014-10-03 MED ORDER — DIVALPROEX SODIUM 250 MG PO DR TAB
500.0000 mg | DELAYED_RELEASE_TABLET | Freq: Every day | ORAL | Status: DC
  Administered 2014-10-04: 500 mg via ORAL
  Filled 2014-10-03: qty 2

## 2014-10-03 MED ORDER — QUETIAPINE FUMARATE 25 MG PO TABS
25.0000 mg | ORAL_TABLET | Freq: Once | ORAL | Status: AC
  Administered 2014-10-03: 25 mg via ORAL
  Filled 2014-10-03: qty 1

## 2014-10-03 MED ORDER — VALPROATE SODIUM 500 MG/5ML IV SOLN
500.0000 mg | Freq: Two times a day (BID) | INTRAVENOUS | Status: DC
  Administered 2014-10-03: 500 mg via INTRAVENOUS
  Filled 2014-10-03 (×2): qty 5

## 2014-10-03 MED ORDER — DIVALPROEX SODIUM 250 MG PO DR TAB
1000.0000 mg | DELAYED_RELEASE_TABLET | Freq: Every day | ORAL | Status: DC
  Administered 2014-10-03: 1000 mg via ORAL
  Filled 2014-10-03: qty 4

## 2014-10-03 NOTE — Evaluation (Signed)
Physical Therapy Evaluation Patient Details Name: Gerald Leach: [id number] DOB: Jan 26, 1934 Today's Date: 10/03/2014   History of Present Illness  Gerald Leach is a 82 y.o. male with Past medical history of seizure disorder with grand mal seizures after subdural hematoma evacuationw ith craniotomy in 09/2012, essential hypertension, hypothyroidism, diabetes mellitus.Present to hospital with recent and seizure activity reported by wife.   Clinical Impression  Pt with what seems to be new onset of mobility challenges, noted ataxic gait, decreased balance and decreased ability with all mobility. To benefit from PT to continue with increasing ability with all mobility.     Follow Up Recommendations SNF (dependeing on pt's progress here. )    Equipment Recommendations  None recommended by PT (if actually true pt has RW .Marland Kitchen he will need it intially)    Recommendations for Other Services       Precautions / Restrictions Precautions Precautions: Fall      Mobility  Bed Mobility Overal bed mobility: Needs Assistance Bed Mobility: Supine to Sit;Sit to Supine     Supine to sit: Min assist;HOB elevated     General bed mobility comments: cues for what we were doinga nd how to progress movment requested of him. Moved slow to the EOB. Also was found in urine, unsure he was aware.   Transfers Overall transfer level: Needs assistance Equipment used: None Transfers: Sit to/from Stand Sit to Stand: +2 physical assistance         General transfer comment: initially tried with hand held assist, however upon rising pt with "Fatima Sanger /bouncy LEs and not able to get LEs to react for catching his balance. It was a little unpredictable. And he stated omething was not right. We then sat back down in recliner and assessed LES coordinationa dn strenght and all seemed WNL.   Ambulation/Gait Ambulation/Gait assistance: +2 physical assistance;Mod assist (varied see details below) Ambulation Distance  (Feet): 150 Feet Assistive device: None (then tried RW ) Gait Pattern/deviations: Step-through pattern     General Gait Details: initially pt with no AD however having difficulty processing and coordinating LE to progress, very challengin to take 5-10 steps anf B HHA with lots of cues and ataxic gait. Then once I started pattern in a straight line, then next 10 steps slightly better, but still ataxic and unpredictable. Then after about 20 feet  began using RW with imporved balnce and stepping pattern, however still with R LE laggin a little and fatgued easily. Pt was not self aware of this and after 150 feet, we just told him it would be good ot sit down.   Stairs            Wheelchair Mobility    Modified Rankin (Stroke Patients Only)       Balance Overall balance assessment: Needs assistance                                           Pertinent Vitals/Pain Pain Assessment: No/denies pain    Home Living Family/patient expects to be discharged to:: Private residence Living Arrangements: Spouse/significant other Available Help at Discharge: Family (wife unable to assist due to her own medical issues. ) Type of Home: House Home Access: Stairs to enter (2) Entrance Stairs-Rails: Right Entrance Stairs-Number of Steps: 2 Home Layout: Multi-level Home Equipment: Walker - 2 wheels;Cane - single point  Prior Function Level of Independence: Independent with assistive device(s)         Comments: pt states he uses a cane , however having difficulty with giving some of this history, stating " I just don't feel right.Marland Kitchen a little hazy"     Hand Dominance        Extremity/Trunk Assessment   Upper Extremity Assessment: Defer to OT evaluation           Lower Extremity Assessment: Generalized weakness (when tested strenght and coordiantion seemed at [facility], however when pt first stood, we had some LOB and jerkiness in BLEs, unable to catch balance or  coordiate progressing L:Es for ambulation.)         Communication   Communication: No difficulties (speech a little unclear at times)  Cognition Arousal/Alertness: Awake/alert Behavior During Therapy: WFL for tasks assessed/performed Overall Cognitive Status: Within Functional Limits for tasks assessed (however noted pt not very clear and stated he felt "hazy". unsure of  accuracy of history at this time. )                      General Comments      Exercises        Assessment/Plan    PT Assessment Patient needs continued PT services  PT Diagnosis Difficulty walking   PT Problem List Decreased strength;Decreased activity tolerance;Decreased balance;Decreased mobility;Decreased knowledge of use of DME  PT Treatment Interventions DME instruction;Gait training;Stair training;Functional mobility training;Therapeutic activities;Patient/family education   PT Goals (Current goals can be found in the Care Plan section) Acute Rehab PT Goals PT Goal Formulation: Patient unable to participate in goal setting Time For Goal Achievement: 10/17/14 Potential to Achieve Goals: Good    Frequency Min 4X/week   Barriers to discharge   lives on multi level and hoas to be able to go up and down steps to go to functional parts of house.    Co-evaluation               End of Session Equipment Utilized During Treatment: Gait belt Activity Tolerance: Patient tolerated treatment well Patient left: in chair;with chair alarm set Nurse Communication: Mobility status         Time: 1220-1300 PT Time Calculation (min) (ACUTE ONLY): 40 min   Charges:   PT Evaluation $Initial PT Evaluation Tier I: 1 Procedure PT Treatments $Gait Training: 8-22 mins $Therapeutic Activity: 8-22 mins   PT G CodesClide Dales 2014-10-22, 2:26 PM  Clide Dales, PT Pager: [phone] 10-22-2014

## 2014-10-03 NOTE — Progress Notes (Signed)
NEURO HOSPITALIST PROGRESS NOTE   SUBJECTIVE:                                                                                                                        Resting comfortably in bed, eating dinner. No further seizures noted. On IV keppra 1,500 mg BID and Depakote 500 mg BID respectively without noticeable side effects. MRI brain was personally reviewed and showed no acute abnormality  Lobulated giant right ICA terminus aneurysm appears stable encompassing 30 x 18 x 21 mm.  4/16 VPA level 37.3 (post loading). Metabolic profile and CBC unrevealing.   OBJECTIVE:                                                                                                                           Vital signs in last 24 hours: Temp:  [97.6 F (36.4 C)-97.8 F (36.6 C)] 97.7 F (36.5 C) (04/17 1410) Pulse Rate:  [73-86] 76 (04/17 1410) Resp:  [17-28] 19 (04/17 1410) BP: (126-160)/(84-98) 126/84 mmHg (04/17 1410) SpO2:  [96 %-99 %] 99 % (04/17 1410) Weight:  [83.915 kg (185 lb)-84.369 kg (186 lb)] 83.915 kg (185 lb) (04/17 0945)  Intake/Output from previous day: 04/16 0701 - 04/17 0700 In: -  Out: 200 [Urine:200] Intake/Output this shift:   Nutritional status: Diet Carb Modified Fluid consistency:: Thin; Room service appropriate?: Yes  Past Medical History  Diagnosis Date  . Hypertension   . Colonic polyp 2003  . GERD (gastroesophageal reflux disease)   . ED (erectile dysfunction)   . Hyperlipidemia   . Hypothyroidism   . Brain aneurysm   . Seizures     per family  . Diabetes mellitus without complication    Physical exam: HEENT- Normocephalic, no lesions, without obvious abnormality. Normal external eye and conjunctiva. Normal TM's bilaterally. Normal auditory canals and external ears. Normal external nose, mucus membranes and septum. Normal pharynx. Neck supple with no masses, nodes, nodules or enlargement. Cardiovascular - regular  rate and rhythm, S1, S2 normal, no murmur, click, rub or gallop Lungs - chest clear, no wheezing, rales, normal symmetric air entry Abdomen - soft, non-tender; bowel sounds normal; no masses, no organomegaly Extremities - no joint deformities, effusion, or inflammation, no edema and no  skin discoloration   Neurologic Exam:  Mental Status: Alert, oriented, thought content appropriate. Speech minimally slurred without evidence of aphasia. Able to follow commands without difficulty. Cranial Nerves: II-Visual fields were normal. III/IV/VI-Pupils were equal and reacted to light. Extraocular movements were full and conjugate.  V/VII-no facial numbness and no facial weakness. VIII-normal. X-minimal dysarthria; symmetrical palatal movement. XI: trapezius strength/neck flexion strength normal bilaterally XII-midline tongue extension with normal strength. Motor: 5/5 bilaterally with normal tone and bulk Sensory: Normal throughout. Deep Tendon Reflexes: 1+ and symmetric. Plantars: Mute bilaterally Cerebellar: Normal finger-to-nose testing. Carotid auscultation: Normal  Lab Results: Lab Results  Component Value Date/Time   CHOL 139 03/25/2014 10:23 AM   Lipid Panel No results for input(s): CHOL, TRIG, HDL, CHOLHDL, VLDL, LDLCALC in the last 72 hours.  Studies/Results: Dg Chest 2 View  10/02/2014   CLINICAL DATA:  Generalized weakness, fall, seizure, shortness of breath.  EXAM: CHEST  2 VIEW  COMPARISON:  Chest and thoracic spine radiographs 02/05/2014  FINDINGS: Lung volumes are low. There is cardiomegaly which is likely accentuated by a portable technique and low lung volumes. Tortuosity of the thoracic aorta is unchanged allowing for differences in technique. Right greater than left bibasilar opacities. No definite pulmonary edema, large pleural effusion or consolidation. Old left-sided rib fractures are seen. Chronic compression deformity of mid thoracic spine, unchanged.  IMPRESSION: 1.  Enlargement of the cardiac silhouette may be related to underlying cardiomegaly versus related to technique. 2. Right basilar opacity, favor atelectasis. Pneumonia could have a similar appearance.   Electronically Signed   By: Jeb Levering M.D.   On: 10/02/2014 21:23   Dg Lumbar Spine Complete  10/02/2014   CLINICAL DATA:  [place] at home getting out of bed today, low back pain, hip pain on the left  EXAM: LUMBAR SPINE - COMPLETE 4+ VIEW  COMPARISON:  Or 03/2013  FINDINGS: Minimal anterior listhesis of L4 on L5 due to degenerative facet change. No fracture. From L2-3 through L5-S1 there is significant degenerative facet change. There is no fracture. Heavy calcification of the aortoiliac vessels.  IMPRESSION: Significant degenerative change with no acute findings   Electronically Signed   By: Skipper Cliche M.D.   On: 10/02/2014 16:13   Ct Head Wo Contrast  10/02/2014   CLINICAL DATA:  Patient status post fall. History of seizures. Weakness.  EXAM: CT HEAD WITHOUT CONTRAST  TECHNIQUE: Contiguous axial images were obtained from the base of the skull through the vertex without intravenous contrast.  COMPARISON:  Brain CT 02/05/2014; brain MR 02/07/2014  FINDINGS: Ventricles and sulci are prominent, compatible with atrophy. There is sequelae of multiple right-sided craniotomies. Unchanged chronic coarsely calcified right clinoid region mass. No significant mass-effect, evidence for acute cortically based infarct or intracranial hemorrhage. Periventricular and subcortical white matter hypodensity. Orbits are unremarkable. Polypoid mucosal thickening left maxillary sinus. Mastoid air cells are well aerated.  IMPRESSION: No acute intracranial process.  Re- demonstrated chronic right supraclinoid mass.  Chronic small vessel ischemic changes.   Electronically Signed   By: Lovey Newcomer M.D.   On: 10/02/2014 16:42   Mr Brain Wo Contrast  10/03/2014   CLINICAL DATA:  81 year old male with hypertension, presenting  after recurrent seizure at home. And then witnessed generalized seizure in the emergency department. Initial encounter.  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  EXAM: MRI HEAD WITHOUT CONTRAST  TECHNIQUE: Multiplanar, multiecho pulse sequences of the brain and  surrounding structures were obtained without intravenous contrast.  COMPARISON:  Head CTs without contrast 10/02/2014 and earlier. Brain MRI 02/07/2014 and earlier.  FINDINGS: Major intracranial vascular flow voids are stable, with chronically occluded appearance of the distal cervical right ICA and ICA siphon. Lobulated giant right ICA terminus aneurysm appears stable encompassing 30 x 18 x 21 mm (AP by transverse by CC)  Sequelae of right side craniotomy. Mild superficial siderosis of the right hemisphere re- identified. Stable cerebral volume since 2015. No restricted diffusion or evidence of acute infarction.  Stable ventricle size and configuration. No acute intracranial mass effect or midline shift. No acute intracranial hemorrhage identified. Negative pituitary, cervicomedullary junction and visualized cervical spine. Stable gray and white matter signal throughout the brain.  Stable paranasal sinuses and mastoids. Trace retained secretions in the nasopharynx. Stable orbit and scalp soft tissues. Normal bone marrow signal.  IMPRESSION: 1. Stable.  No acute intracranial abnormality. 2. Sequelae of right ICA occlusion with stable thrombosed appearing giant right ICA terminus aneurysm up to 3 cm. 3. 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 Sequelae of right frontal craniotomy. 4. Cerebral volume loss with suspected ex vacuo ventricle enlargement and nonspecific chronic white matter signal changes.   Electronically Signed   By: Genevie Ann M.D.   On: 10/03/2014 10:38   Dg Hip Unilat With Pelvis 2-3 Views Left  10/02/2014   CLINICAL DATA:  81 year old male  with a history of fall. Left hip pain.  EXAM: LEFT HIP (WITH PELVIS) 2-3 VIEWS  COMPARISON:  Contemporaneous plain film of the lumbar spine. Prior abdominal plain film 09/25/2012  FINDINGS: Bony pelvic ring is intact with no acute bony abnormality identified. Bilateral hips projects normally over the acetabula.  Proximal aspects of the bilateral femurs unremarkable.  No displaced fracture of the proximal left femur identified. Left hip appears congruent.  Mild changes of osteoarthritis.  Incidental note made of transitional vertebral body of L6/S1, with likely ankylosis of the right L6 transverse process with the iliac bone.  IMPRESSION: No acute bony abnormality identified.  Signed,  Dulcy Fanny. Earleen Newport, DO  [facility]  [facility]   Electronically Signed   By: Corrie Mckusick D.O.   On: 10/02/2014 16:16    MEDICATIONS                                                                                                                        Scheduled: . heparin  5,000 Units Subcutaneous 3 times per day  . insulin aspart  0-9 Units Subcutaneous Q6H  . levETIRAcetam  1,500 mg Intravenous Q12H  . levothyroxine  50 mcg Oral QAC breakfast  . simvastatin  10 mg Oral QHS  . sodium chloride  3 mL Intravenous Q12H  . valproate sodium  500 mg Intravenous Q12H    ASSESSMENT/PLAN:  81 year old man with recurrent breakthrough generalized seizures, etiology probably symptomatic form prior subdural hematoma s/p craniotomy. Mental status is back to baseline at this point. He has no focal findings. MRI scan was personally reviewed and showed no acute intracranial abnormality. Post IV loading Depakote sub-therapeutic 37.3: will increase maintenance dose to 500-1,000 mg BID starting tonight. No further neurological intervention required. Will sign off.   Dorian Pod,  MD [facility] [phone]  10/03/2014, 7:06 PM

## 2014-10-03 NOTE — Evaluation (Signed)
Clinical/Bedside Swallow Evaluation Patient Details  Name: Gerald Leach MRN: [id number] Date of Birth: 1933/11/30  Today's Date: 10/03/2014 Time: SLP Start Time (ACUTE ONLY): 1127 SLP Stop Time (ACUTE ONLY): 1142 SLP Time Calculation (min) (ACUTE ONLY): 15 min  Past Medical History:  Past Medical History  Diagnosis Date  . Hypertension   . Colonic polyp 2003  . GERD (gastroesophageal reflux disease)   . ED (erectile dysfunction)   . Hyperlipidemia   . Hypothyroidism   . Brain aneurysm   . Seizures     per family  . Diabetes mellitus without complication    Past Surgical History:  Past Surgical History  Procedure Laterality Date  . Brain aneurysm surgery      at [facility]  . Carotid artery aneurysm    . Colonoscopy      polyps  . Right hernia    . Craniotomy Right 09/19/2012    Procedure: CRANIOTOMY HEMATOMA EVACUATION SUBDURAL;  Surgeon: Otilio Connors, MD;  Location: [place] NEUR[facility] ORS;  Service: Neurosurgery;  Laterality: Right;  . Craniotomy Right 09/22/2012    Procedure: CRANIOTOMY HEMATOMA EVACUATION SUBDURAL;  Surgeon: Otilio Connors, MD;  Location: [place] NEURO ORS;  Service: Neurosurgery;  Laterality: Right;   HPI:  81 y.o. male with past medical history of seizure disorder, subdural hematoma, essential hypertension, hypothyroidism, diabetes mellitus, GERD, brain aneursym, craniotomy 09/2012 after [facility] admitted s/p fall. MRI No acute intracranial abnormality. CXR Right basilar opacity, favor atelectasis. Pneumonia could have a similar appearance. Had NBS 09/26/12 with Dys 2, thin recommended.   Assessment / Plan / Recommendation Clinical Impression  Pt demonstrated mild oral delays/holding bolus prior to initiating transit (pt stated he "doesn't know why he is doing it").  No pharyngeal impairments present. Pt advised to consume small bites/sips, straws allowed. Reports difficulty swallowing larger pills. Recommend regular texture, thin liquids, pills with water except larger pills  whole in applesauce. No follow up needed.    Aspiration Risk  Mild    Diet Recommendation Regular;Thin liquid   Liquid Administration via: Straw;Cup Medication Administration: Whole meds with liquid (large pills whole in applesauce) Supervision: Patient able to self feed Compensations: Slow rate;Small sips/bites Postural Changes and/or Swallow Maneuvers: Seated upright 90 degrees;Upright 30-60 min after meal    Other  Recommendations Oral Care Recommendations: Oral care BID   Follow Up Recommendations  None    Frequency and Duration        Pertinent Vitals/Pain none         Swallow Study           Oral/Motor/Sensory Function Overall Oral Motor/Sensory Function: Appears within functional limits for tasks assessed   Ice Chips Ice chips: Within functional limits Presentation: Spoon   Thin Liquid Thin Liquid: Impaired Presentation: Cup;Straw Oral Phase Impairments: Impaired anterior to posterior transit Oral Phase Functional Implications: Prolonged oral transit;Oral holding Pharyngeal  Phase Impairments: Suspected delayed Swallow    Nectar Thick Nectar Thick Liquid: Not tested   Honey Thick Honey Thick Liquid: Not tested   Puree Puree: Within functional limits   Solid   GO    Solid: Within functional limits       Houston Siren 10/03/2014,11:56 AM  Orbie Pyo Colvin Caroli.Ed [employer] [phone]

## 2014-10-03 NOTE — Progress Notes (Signed)
Notified by CMT ,Kieth Brightly, patient had 12 beats of vtach. Patient asymptomatic denies any pain or shortness of breath. Vital signs are blood pressure 155/ 87, temp oral 97.6, pulse 76, respirations 17, spo2 on room air 97%. Dr. Coralyn Pear paged. Will continue to monitor patient.

## 2014-10-03 NOTE — Progress Notes (Addendum)
[facility] PROGRESS NOTE  Gerald Leach PZW:[id number] DOB: 01-24-1934 DOA: 10/02/2014 PCP: [name], MD  Assessment/Plan: 1. Seizure disorder. -Patient presenting with recurrent seizures. Spoke with his son this afternoon here wasn't quite sure if he was taken his seizure medication as indicated. There were concerns that may have skipped a few doses. -Initial CT scan of brain was negative, this followed by an MRI of brain which did not reveal evidence of acute CVA. -He is currently maintained on Keppra 500 mg by mouth twice a day and was started on Depakote 500 mg IV twice a day after receiving 1000 mg loading dose. -Has been seizure-free on the floor. -Await further recommendations from neurology  2.  Hypothyroidism. -TSH elevated at 5.026 -His son reporting concerns about him taking his prescription medications at home as directed. -I think in the future will need supervision, for now continue Synthroid 50 g by mouth daily  3.  Dyslipidemia -Continue simvastatin 10 mg by mouth daily  4.  Deconditioning. -Patient about with by physical therapy today recommending skilled nursing facility placement  5.  Right basilar opacity. -Initial chest x-ray showing right basilar opacity which per radiology favored atelectasis. So far he has remained afebrile, having white count of 8100, with oxygen saturations of 99% on room air. On exam he appeared to have clear lungs with normal inspiratory effort. -Will repeat chest x-ray in a.m. to ensure stability.  Code Status: Full code Family Communication: Spoke to her son over telephone conversation Disposition Plan: Patient may benefit from rehabilitation at skilled nursing facility   Consultants:  Neurology   HPI/Subjective: Patient is a pleasant 81 year old gentleman with a past medical history of subdural hematoma with surgical evacuation having a.m. assessment seizure disorder who was admitted to the medicine service on  10/02/2014. He presented with recurrent seizures, family members having multiple seizures at home prior to this presentation than having a witnessed seizure in the emergency room. Patient was taking Keppra 1500 mg by mouth twice a day. Initial CT scan of brain did not reveal acute intracranial findings. He was given a loading dose of Depakote thousand milligrams IV followed by 500 mg IV twice a day. Patient was seen and evaluated by neurology. MRI of brain did not show evidence of acute CVA. Patient this afternoon becoming more confused, mildly sedated.   Objective: Filed Vitals:   10/03/14 1410  BP: 126/84  Pulse: 76  Temp: 97.7 F (36.5 C)  Resp: 19    Intake/Output Summary (Last 24 hours) at 10/03/14 1644 Last data filed at 10/03/14 1500  Gross per 24 hour  Intake    643 ml  Output    750 ml  Net   -107 ml   Filed Weights   10/02/14 1459 10/02/14 2130 10/03/14 0614  Weight: 84.369 kg (186 lb) 84.369 kg (186 lb) 84.1 kg (185 lb 6.5 oz)    Exam:   General:  Patient is awake and alert, he was able to tell me that this was [facility] in [facility] [place], year being 2016.  Cardiovascular: Regular rate and rhythm normal S1-S2  Respiratory: Normal S ref, lungs are clear to auscultation bilaterally  Abdomen: Soft nontender nondistended  Musculoskeletal: No edema  Data Reviewed: Basic Metabolic Panel:  Recent Labs Lab 10/02/14 1615 10/03/14 0605  NA 138 135  K 3.9 3.6  CL 104 105  CO2 25 25  GLUCOSE 157* 123*  BUN 14 10  CREATININE 0.89 0.78  CALCIUM 8.5  8.0*  MG  --  2.0   Liver Function Tests:  Recent Labs Lab 10/02/14 1615 10/03/14 0605  AST 27 18  ALT 17 16  ALKPHOS 69 64  BILITOT 0.6 1.0  PROT 7.0 6.4  ALBUMIN 3.9 3.3*   No results for input(s): LIPASE, AMYLASE in the last 168 hours. No results for input(s): AMMONIA in the last 168 hours. CBC:  Recent Labs Lab 10/02/14 1615 10/03/14 0605  WBC 9.7 8.1   NEUTROABS 7.1 4.9  HGB 12.0* 11.4*  HCT 38.0* 36.4*  MCV 79.5 79.8  PLT 259 230   Cardiac Enzymes: No results for input(s): CKTOTAL, CKMB, CKMBINDEX, TROPONINI in the last 168 hours. BNP (last 3 results) No results for input(s): BNP in the last 8760 hours.  ProBNP (last 3 results) No results for input(s): PROBNP in the last 8760 hours.  CBG:  Recent Labs Lab 10/02/14 2329 10/03/14 0410 10/03/14 1115  GLUCAP 109* 119* 105*    No results found for this or any previous visit (from the past 240 hour(s)).   Studies: Dg Chest 2 View  10/02/2014   CLINICAL DATA:  Generalized weakness, fall, seizure, shortness of breath.  EXAM: CHEST  2 VIEW  COMPARISON:  Chest and thoracic spine radiographs 02/05/2014  FINDINGS: Lung volumes are low. There is cardiomegaly which is likely accentuated by a portable technique and low lung volumes. Tortuosity of the thoracic aorta is unchanged allowing for differences in technique. Right greater than left bibasilar opacities. No definite pulmonary edema, large pleural effusion or consolidation. Old left-sided rib fractures are seen. Chronic compression deformity of mid thoracic spine, unchanged.  IMPRESSION: 1. Enlargement of the cardiac silhouette may be related to underlying cardiomegaly versus related to technique. 2. Right basilar opacity, favor atelectasis. Pneumonia could have a similar appearance.   Electronically Signed   By: Jeb Levering M.D.   On: 10/02/2014 21:23   Dg Lumbar Spine Complete  10/02/2014   CLINICAL DATA:  [place] at home getting out of bed today, low back pain, hip pain on the left  EXAM: LUMBAR SPINE - COMPLETE 4+ VIEW  COMPARISON:  Or 03/2013  FINDINGS: Minimal anterior listhesis of L4 on L5 due to degenerative facet change. No fracture. From L2-3 through L5-S1 there is significant degenerative facet change. There is no fracture. Heavy calcification of the aortoiliac vessels.  IMPRESSION: Significant degenerative change with no acute  findings   Electronically Signed   By: Skipper Cliche M.D.   On: 10/02/2014 16:13   Ct Head Wo Contrast  10/02/2014   CLINICAL DATA:  Patient status post fall. History of seizures. Weakness.  EXAM: CT HEAD WITHOUT CONTRAST  TECHNIQUE: Contiguous axial images were obtained from the base of the skull through the vertex without intravenous contrast.  COMPARISON:  Brain CT 02/05/2014; brain MR 02/07/2014  FINDINGS: Ventricles and sulci are prominent, compatible with atrophy. There is sequelae of multiple right-sided craniotomies. Unchanged chronic coarsely calcified right clinoid region mass. No significant mass-effect, evidence for acute cortically based infarct or intracranial hemorrhage. Periventricular and subcortical white matter hypodensity. Orbits are unremarkable. Polypoid mucosal thickening left maxillary sinus. Mastoid air cells are well aerated.  IMPRESSION: No acute intracranial process.  Re- demonstrated chronic right supraclinoid mass.  Chronic small vessel ischemic changes.   Electronically Signed   By: Lovey Newcomer M.D.   On: 10/02/2014 16:42   Mr Brain Wo Contrast  10/03/2014   CLINICAL DATA:  81 year old male with hypertension, presenting after recurrent seizure at home.  And then witnessed generalized seizure in the emergency department. Initial encounter.  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  EXAM: MRI HEAD WITHOUT CONTRAST  TECHNIQUE: Multiplanar, multiecho pulse sequences of the brain and surrounding structures were obtained without intravenous contrast.  COMPARISON:  Head CTs without contrast 10/02/2014 and earlier. Brain MRI 02/07/2014 and earlier.  FINDINGS: Major intracranial vascular flow voids are stable, with chronically occluded appearance of the distal cervical right ICA and ICA siphon. Lobulated giant right ICA terminus aneurysm appears stable encompassing 30 x 18 x 21 mm (AP by transverse  by CC)  Sequelae of right side craniotomy. Mild superficial siderosis of the right hemisphere re- identified. Stable cerebral volume since 2015. No restricted diffusion or evidence of acute infarction.  Stable ventricle size and configuration. No acute intracranial mass effect or midline shift. No acute intracranial hemorrhage identified. Negative pituitary, cervicomedullary junction and visualized cervical spine. Stable gray and white matter signal throughout the brain.  Stable paranasal sinuses and mastoids. Trace retained secretions in the nasopharynx. Stable orbit and scalp soft tissues. Normal bone marrow signal.  IMPRESSION: 1. Stable.  No acute intracranial abnormality. 2. Sequelae of right ICA occlusion with stable thrombosed appearing giant right ICA terminus aneurysm up to 3 cm. 3. 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 Sequelae of right frontal craniotomy. 4. Cerebral volume loss with suspected ex vacuo ventricle enlargement and nonspecific chronic white matter signal changes.   Electronically Signed   By: Genevie Ann M.D.   On: 10/03/2014 10:38   Dg Hip Unilat With Pelvis 2-3 Views Left  10/02/2014   CLINICAL DATA:  81 year old male with a history of fall. Left hip pain.  EXAM: LEFT HIP (WITH PELVIS) 2-3 VIEWS  COMPARISON:  Contemporaneous plain film of the lumbar spine. Prior abdominal plain film 09/25/2012  FINDINGS: Bony pelvic ring is intact with no acute bony abnormality identified. Bilateral hips projects normally over the acetabula.  Proximal aspects of the bilateral femurs unremarkable.  No displaced fracture of the proximal left femur identified. Left hip appears congruent.  Mild changes of osteoarthritis.  Incidental note made of transitional vertebral body of L6/S1, with likely ankylosis of the right L6 transverse process with the iliac bone.  IMPRESSION: No acute bony abnormality identified.  Signed,  Dulcy Fanny. Earleen Newport, DO  [facility]   [facility]   Electronically Signed   By: Corrie Mckusick D.O.   On: 10/02/2014 16:16    Scheduled Meds: . heparin  5,000 Units Subcutaneous 3 times per day  . insulin aspart  0-9 Units Subcutaneous Q6H  . levETIRAcetam  1,500 mg Intravenous Q12H  . levothyroxine  50 mcg Oral QAC breakfast  . simvastatin  10 mg Oral QHS  . sodium chloride  3 mL Intravenous Q12H  . valproate sodium  500 mg Intravenous Q12H   Continuous Infusions: . 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    Time spent: 30 min    Kelvin Cellar  [facility] Pager [phone]. If 7PM-7AM, please contact night-coverage  at [email or web address], password [facility] [place] 10/03/2014, 4:44 PM  LOS: 1 day

## 2014-10-03 NOTE — Progress Notes (Signed)
CARE MANAGEMENT NOTE 10/03/2014  Patient:  NIXXON, FARIA   Account Number:  [id number]  Date Initiated:  10/03/2014  Documentation initiated by:  DAVIS,RHONDA  Subjective/Objective Assessment:   pt with witness generalized seizure activity and poss cva-hx of subdural bleed with evacuation in 2014/     Action/Plan:   tbd based on mri, and speech eval,neuro checks q2-4 hours   Anticipated DC Date:  10/06/2014   Anticipated DC Plan:  HOME/SELF CARE  In-house referral  NA      DC Planning Services  CM consult      Choice offered to / List presented to:             Status of service:  In process, will continue to follow Medicare Important Message given?   (If response is "NO", the following Medicare IM given date fields will be blank) Date Medicare IM given:   Medicare IM given by:   Date Additional Medicare IM given:   Additional Medicare IM given by:    Discharge Disposition:    Per UR Regulation:  Reviewed for med. necessity/level of care/duration of stay  If discussed at [place] of Stay Meetings, dates discussed:    Comments:  October 03, 2014/Rhonda L. Rosana Hoes, RN, BSN, CCM. Case Management [place] [phone] No discharge needs present of time of review.

## 2014-10-04 ENCOUNTER — Inpatient Hospital Stay (HOSPITAL_COMMUNITY): Payer: Medicare Other

## 2014-10-04 DIAGNOSIS — Z9889 Other specified postprocedural states: Secondary | ICD-10-CM

## 2014-10-04 DIAGNOSIS — R2689 Other abnormalities of gait and mobility: Secondary | ICD-10-CM | POA: Diagnosis not present

## 2014-10-04 DIAGNOSIS — R569 Unspecified convulsions: Secondary | ICD-10-CM | POA: Diagnosis not present

## 2014-10-04 DIAGNOSIS — I10 Essential (primary) hypertension: Secondary | ICD-10-CM | POA: Diagnosis not present

## 2014-10-04 DIAGNOSIS — E785 Hyperlipidemia, unspecified: Secondary | ICD-10-CM | POA: Diagnosis not present

## 2014-10-04 DIAGNOSIS — M6281 Muscle weakness (generalized): Secondary | ICD-10-CM | POA: Diagnosis not present

## 2014-10-04 DIAGNOSIS — Z9114 Patient's other noncompliance with medication regimen: Secondary | ICD-10-CM | POA: Diagnosis not present

## 2014-10-04 DIAGNOSIS — R410 Disorientation, unspecified: Secondary | ICD-10-CM | POA: Diagnosis not present

## 2014-10-04 DIAGNOSIS — W19XXXA Unspecified fall, initial encounter: Secondary | ICD-10-CM | POA: Diagnosis not present

## 2014-10-04 DIAGNOSIS — G40909 Epilepsy, unspecified, not intractable, without status epilepticus: Secondary | ICD-10-CM | POA: Diagnosis not present

## 2014-10-04 DIAGNOSIS — G4089 Other seizures: Secondary | ICD-10-CM | POA: Diagnosis not present

## 2014-10-04 DIAGNOSIS — E039 Hypothyroidism, unspecified: Secondary | ICD-10-CM | POA: Diagnosis not present

## 2014-10-04 DIAGNOSIS — J9811 Atelectasis: Secondary | ICD-10-CM | POA: Diagnosis not present

## 2014-10-04 DIAGNOSIS — K219 Gastro-esophageal reflux disease without esophagitis: Secondary | ICD-10-CM | POA: Diagnosis not present

## 2014-10-04 DIAGNOSIS — I517 Cardiomegaly: Secondary | ICD-10-CM | POA: Diagnosis not present

## 2014-10-04 DIAGNOSIS — R5381 Other malaise: Secondary | ICD-10-CM

## 2014-10-04 DIAGNOSIS — E119 Type 2 diabetes mellitus without complications: Secondary | ICD-10-CM | POA: Diagnosis not present

## 2014-10-04 DIAGNOSIS — Z9181 History of falling: Secondary | ICD-10-CM | POA: Diagnosis not present

## 2014-10-04 DIAGNOSIS — G40409 Other generalized epilepsy and epileptic syndromes, not intractable, without status epilepticus: Secondary | ICD-10-CM | POA: Diagnosis not present

## 2014-10-04 LAB — BASIC METABOLIC PANEL
ANION GAP: 9 (ref 5–15)
BUN: 13 mg/dL (ref 6–23)
CALCIUM: 8.2 mg/dL — AB (ref 8.4–10.5)
CHLORIDE: 103 mmol/L (ref 96–112)
CO2: 21 mmol/L (ref 19–32)
Creatinine, Ser: 0.71 mg/dL (ref 0.50–1.35)
GFR calc Af Amer: >90 mL/min (ref >90)
GFR calc non Af Amer: 88 mL/min — ABNORMAL LOW (ref >90)
Glucose, Bld: 122 mg/dL — ABNORMAL HIGH (ref 70–99)
POTASSIUM: 4 mmol/L (ref 3.5–5.1)
SODIUM: 133 mmol/L — AB (ref 135–145)

## 2014-10-04 LAB — GLUCOSE, CAPILLARY
GLUCOSE-CAPILLARY: 94 mg/dL (ref 70–99)
GLUCOSE-CAPILLARY: 85 mg/dL (ref 70–99)
Glucose-Capillary: 112 mg/dL — ABNORMAL HIGH (ref 70–99)

## 2014-10-04 LAB — HEMOGLOBIN A1C
HEMOGLOBIN A1C: 6.4 % — AB (ref 4.8–5.6)
Mean Plasma Glucose: 137 mg/dL

## 2014-10-04 LAB — CBC
HEMATOCRIT: 40.2 % (ref 39.0–52.0)
Hemoglobin: 12.8 g/dL — ABNORMAL LOW (ref 13.0–17.0)
MCH: 25.7 pg — ABNORMAL LOW (ref 26.0–34.0)
MCHC: 31.8 g/dL (ref 30.0–36.0)
MCV: 80.6 fL (ref 78.0–100.0)
PLATELETS: 223 10*3/uL (ref 150–400)
RBC: 4.99 MIL/uL (ref 4.22–5.81)
RDW: 16.2 % — AB (ref 11.5–15.5)
WBC: 8.5 10*3/uL (ref 4.0–10.5)

## 2014-10-04 LAB — LEVETIRACETAM LEVEL: Levetiracetam Lvl: NOT DETECTED ug/mL (ref 10.0–40.0)

## 2014-10-04 LAB — PROLACTIN: PROLACTIN: 11.7 ng/mL (ref 4.0–15.2)

## 2014-10-04 MED ORDER — DIVALPROEX SODIUM 500 MG PO DR TAB: 1000.0000 mg | DELAYED_RELEASE_TABLET | Freq: Every day | ORAL | Status: DC

## 2014-10-04 MED ORDER — DIVALPROEX SODIUM 500 MG PO DR TAB: 500.0000 mg | DELAYED_RELEASE_TABLET | Freq: Every day | ORAL | Status: DC

## 2014-10-04 MED ORDER — LEVETIRACETAM 500 MG PO TABS: 1500.0000 mg | ORAL_TABLET | Freq: Two times a day (BID) | ORAL | Status: DC

## 2014-10-04 NOTE — Discharge Summary (Signed)
Physician Discharge Summary  Gerald Leach PZW:[id number] DOB: January 12, 1934 DOA: 10/02/2014  PCP: [name], MD  Admit date: 10/02/2014 Discharge date: 10/04/2014  Time spent: 35 minutes  Recommendations for Outpatient Follow-up:  1. Patient admitted for recurrent seizures, Depakote was added to his regimen, he was discharged on Depakote 500 mg PO BID along with Keppra 1,500 PO BID.  2. Repeat TSH in 3-4 weeks 3. Given deconditioning he was discharged to SNF for Rehab.   Discharge Diagnoses:  Principal Problem:   Seizure disorder, grand mal Active Problems:   Hypothyroidism   GERD   S/P craniotomy   Seizure disorder   Fall at home   CNS aneurysm s/p coiling ([facility], 2002)   High grade T7 central canal stenosis with T7 vertebral fracture s/p fall (02/2012)   Right clinoid calcified meningioma vs thrombosed giant ICA aneurysm, conservative management.   Discharge Condition: Stable  Diet recommendation: Heart Healthy Carb Mod Diet  Filed Weights   10/02/14 2130 10/03/14 0614 10/04/14 0636  Weight: 84.369 kg (186 lb) 84.1 kg (185 lb 6.5 oz) 84.1 kg (185 lb 6.5 oz)    History of present illness:  Gerald Leach is a 81 y.o. male with Past medical history of seizure disorder with grand mal seizures after subdural hematoma, essential hypertension, hypothyroidism, diabetes mellitus. The patient is presenting with fall. Patient mentions that he was at his baseline earlier in the morning at 1:30 when he woke up to go to the restroom he suddenly lost his balance and fell down. He did not lose any consciousness did not hit his head or neck. After this fall the patient was again able to ambulate but around afternoon 1 PM he had another fall. Patient mentions that a wire wrapped around his ankle on both times and he fell. Patient's wife reportedly mention to him that he was having seizure like episode. Patient at the time of my evaluation denies any complaint of headache, blurring of  the vision, speech difficulty, neck pain, recent cough or runny nose, nausea or vomiting, abdominal pain, chest pain, diarrhea, constipation, burning urination. He mentions he has been compliant with all his medication and has taken Keppra in the morning. While the patient was here in the ER he was given an extra dose of Depakote after discussing with neurology and was recommended to send home on Depakote along with Keppra. After receiving the Depakote the patient had another episode of seizure like activity without any tongue bite or loss of control of bowel or bladder. This was witnessed by RN as well as ED physician. Patient was given Ativan but the episode lasted for a few seconds and was reportedly at back his baseline prior to Ativan.  Hospital Course:  Patient is a pleasant 81 year old gentleman with a past medical history of subdural hematoma with surgical evacuation having a.m. assessment seizure disorder who was admitted to the medicine service on 10/02/2014. He presented with recurrent seizures, family members having multiple seizures at home prior to this presentation than having a witnessed seizure in the emergency room. Patient was taking Keppra 1500 mg by mouth twice a day. Initial CT scan of brain did not reveal acute intracranial findings. He was given a loading dose of Depakote thousand milligrams IV followed by 500 mg IV twice a day. Patient was seen and evaluated by neurology. MRI of brain did not show evidence of acute CVA.    Seizure disorder. -Patient presenting with recurrent seizures. Spoke with his son this afternoon here  wasn't quite sure if he was taken his seizure medication as indicated. There were concerns that may have skipped a few doses. -Initial CT scan of brain was negative, this followed by an MRI of brain which did not reveal evidence of acute CVA. -He will be discharged on Keppra 1,500 mg PO BID and Depakote 500 mg PO BID. -Patient will need follow up with his  neurologist Dr Posey Pronto in 1 week.   2. Hypothyroidism. -TSH elevated at 5.026 -His son reporting concerns about him taking his prescription medications at home as directed. -Will discharge on Synthroid 50 g by mouth daily, please follow up on a TSH in 3-4 weeks.   3. Dyslipidemia -Continue simvastatin 10 mg by mouth daily  4. Deconditioning. -Patient to receive rehab at skilled nursing facility  5. Right basilar opacity. -Initial chest x-ray showing right basilar opacity which per radiology favored atelectasis. So far he has remained afebrile, having white count of 8100, with oxygen saturations of 99% on room air. On exam he appeared to have clear lungs with normal inspiratory effort. -Repeat chest x-ray this am showed clear right lung  Consultations:  Neurology  Discharge Exam: Filed Vitals:   10/04/14 0437  BP: 153/86  Pulse: 70  Temp: 98.3 F (36.8 C)  Resp: 18     General: Patient is awake and alert, looks a little better today  Cardiovascular: Regular rate and rhythm normal S1-S2  Respiratory: Normal S ref, lungs are clear to auscultation bilaterally  Abdomen: Soft nontender nondistended  Musculoskeletal: No edema Discharge Instructions   Discharge Instructions    Call MD for:  difficulty breathing, headache or visual disturbances    Complete by:  As directed      Call MD for:  extreme fatigue    Complete by:  As directed      Call MD for:  hives    Complete by:  As directed      Call MD for:  persistant dizziness or light-headedness    Complete by:  As directed      Call MD for:  persistant nausea and vomiting    Complete by:  As directed      Call MD for:  redness, tenderness, or signs of infection (pain, swelling, redness, odor or green/yellow discharge around incision site)    Complete by:  As directed      Call MD for:  severe uncontrolled pain    Complete by:  As directed      Call MD for:  temperature >100.4    Complete by:  As directed       Diet - low sodium heart healthy    Complete by:  As directed      Increase activity slowly    Complete by:  As directed           Current Discharge Medication List    START taking these medications   Details  !! divalproex (DEPAKOTE) 500 MG DR tablet Take 2 tablets (1,000 mg total) by mouth at bedtime. Qty: 30 tablet, Refills: 1    !! divalproex (DEPAKOTE) 500 MG DR tablet Take 1 tablet (500 mg total) by mouth daily after breakfast. Qty: 30 tablet, Refills: 1     !! - Potential duplicate medications found. Please discuss with provider.    CONTINUE these medications which have NOT CHANGED   Details  levETIRAcetam (KEPPRA) 750 MG tablet Take 2 tablets (1,500 mg total) by mouth 2 (two) times daily. Qty: 120 tablet, Refills:  5   Associated Diagnoses: Seizures    levothyroxine (SYNTHROID, LEVOTHROID) 50 MCG tablet Take 1 tablet (50 mcg total) by mouth daily before breakfast. Qty: 100 tablet, Refills: 3   Associated Diagnoses: Hyperlipidemia, group D    metFORMIN (GLUCOPHAGE) 500 MG tablet TAKE 1 TABLET BY MOUTH 2 TIMES DAILY WITH A MEAL. Qty: 200 tablet, Refills: 3   Associated Diagnoses: Hyperlipidemia, group D    Multiple Vitamin (MULTIVITAMIN WITH MINERALS) TABS tablet Take 1 tablet by mouth 2 (two) times daily.     omeprazole (PRILOSEC) 20 MG capsule Take 1 capsule (20 mg total) by mouth every Monday, Wednesday, and Friday. Qty: 100 capsule, Refills: 3   Associated Diagnoses: Hyperlipidemia, group D    simvastatin (ZOCOR) 10 MG tablet Take 1 tablet (10 mg total) by mouth at bedtime. Qty: 100 tablet, Refills: 3   Associated Diagnoses: Hyperlipidemia, group D    ONE TOUCH LANCETS MISC 1 each by Does not apply route daily. Qty: 200 each, Refills: 0       No Known Allergies Follow-up Information    Follow up with PATEL, DONIKA, DO In 1 week.   Specialty:  Neurology   Contact information:   [address] [place] [address] [phone]        Follow up with [name],[name], MD In 2 weeks.   Specialty:  Family Medicine   Contact information:   [place] [address] [phone]        The results of significant diagnostics from this hospitalization (including imaging, microbiology, ancillary and laboratory) are listed below for reference.    Significant Diagnostic Studies: Dg Chest 2 View  10/04/2014   CLINICAL DATA:  Seizures, confusion, right basilar opacity on recent x-ray  EXAM: CHEST  2 VIEW  COMPARISON:  Portable chest x-ray of October 02, 2014  FINDINGS: The lungs are adequately inflated. Right basilar opacity has markedly improved. There is minimal subsegmental atelectasis at the left lung base. There is no pleural effusion. The cardiac silhouette is mildly enlarged. The pulmonary vascularity is normal. There is tortuosity of the descending thoracic aorta. There are old rib deformities bilaterally. There is high-grade wedge deformity of a mid thoracic vertebral body unchanged since a thoracic spine series of Oct 20, 2012.  IMPRESSION: Minimal subsegmental atelectasis at the left base. The right lung is clear. Stable cardiomegaly.   Electronically Signed   By: David  Martinique   On: 10/04/2014 08:50   Dg Chest 2 View  10/02/2014   CLINICAL DATA:  Generalized weakness, fall, seizure, shortness of breath.  EXAM: CHEST  2 VIEW  COMPARISON:  Chest and thoracic spine radiographs 02/05/2014  FINDINGS: Lung volumes are low. There is cardiomegaly which is likely accentuated by a portable technique and low lung volumes. Tortuosity of the thoracic aorta is unchanged allowing for differences in technique. Right greater than left bibasilar opacities. No definite pulmonary edema, large pleural effusion or consolidation. Old left-sided rib fractures are seen. Chronic compression deformity of mid thoracic spine, unchanged.  IMPRESSION: 1. Enlargement of the cardiac silhouette may be related to underlying cardiomegaly versus  related to technique. 2. Right basilar opacity, favor atelectasis. Pneumonia could have a similar appearance.   Electronically Signed   By: Jeb Levering M.D.   On: 10/02/2014 21:23   Dg Lumbar Spine Complete  10/02/2014   CLINICAL DATA:  [place] at home getting out of bed today, low back pain, hip pain on the left  EXAM: LUMBAR  SPINE - COMPLETE 4+ VIEW  COMPARISON:  Or 03/2013  FINDINGS: Minimal anterior listhesis of L4 on L5 due to degenerative facet change. No fracture. From L2-3 through L5-S1 there is significant degenerative facet change. There is no fracture. Heavy calcification of the aortoiliac vessels.  IMPRESSION: Significant degenerative change with no acute findings   Electronically Signed   By: Skipper Cliche M.D.   On: 10/02/2014 16:13   Ct Head Wo Contrast  10/02/2014   CLINICAL DATA:  Patient status post fall. History of seizures. Weakness.  EXAM: CT HEAD WITHOUT CONTRAST  TECHNIQUE: Contiguous axial images were obtained from the base of the skull through the vertex without intravenous contrast.  COMPARISON:  Brain CT 02/05/2014; brain MR 02/07/2014  FINDINGS: Ventricles and sulci are prominent, compatible with atrophy. There is sequelae of multiple right-sided craniotomies. Unchanged chronic coarsely calcified right clinoid region mass. No significant mass-effect, evidence for acute cortically based infarct or intracranial hemorrhage. Periventricular and subcortical white matter hypodensity. Orbits are unremarkable. Polypoid mucosal thickening left maxillary sinus. Mastoid air cells are well aerated.  IMPRESSION: No acute intracranial process.  Re- demonstrated chronic right supraclinoid mass.  Chronic small vessel ischemic changes.   Electronically Signed   By: Lovey Newcomer M.D.   On: 10/02/2014 16:42   Mr Brain Wo Contrast  10/03/2014   CLINICAL DATA:  81 year old male with hypertension, presenting after recurrent seizure at home. And then witnessed generalized seizure in the emergency  department. Initial encounter.  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  EXAM: MRI HEAD WITHOUT CONTRAST  TECHNIQUE: Multiplanar, multiecho pulse sequences of the brain and surrounding structures were obtained without intravenous contrast.  COMPARISON:  Head CTs without contrast 10/02/2014 and earlier. Brain MRI 02/07/2014 and earlier.  FINDINGS: Major intracranial vascular flow voids are stable, with chronically occluded appearance of the distal cervical right ICA and ICA siphon. Lobulated giant right ICA terminus aneurysm appears stable encompassing 30 x 18 x 21 mm (AP by transverse by CC)  Sequelae of right side craniotomy. Mild superficial siderosis of the right hemisphere re- identified. Stable cerebral volume since 2015. No restricted diffusion or evidence of acute infarction.  Stable ventricle size and configuration. No acute intracranial mass effect or midline shift. No acute intracranial hemorrhage identified. Negative pituitary, cervicomedullary junction and visualized cervical spine. Stable gray and white matter signal throughout the brain.  Stable paranasal sinuses and mastoids. Trace retained secretions in the nasopharynx. Stable orbit and scalp soft tissues. Normal bone marrow signal.  IMPRESSION: 1. Stable.  No acute intracranial abnormality. 2. Sequelae of right ICA occlusion with stable thrombosed appearing giant right ICA terminus aneurysm up to 3 cm. 3. 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 Sequelae of right frontal craniotomy. 4. Cerebral volume loss with suspected ex vacuo ventricle enlargement and nonspecific chronic white matter signal changes.   Electronically Signed   By: Genevie Ann M.D.   On: 10/03/2014 10:38   Dg Hip Unilat With Pelvis 2-3 Views Left  10/02/2014   CLINICAL DATA:  81 year old male with a history of fall. Left hip pain.  EXAM: LEFT HIP (WITH PELVIS) 2-3 VIEWS   COMPARISON:  Contemporaneous plain film of the lumbar spine. Prior abdominal plain film 09/25/2012  FINDINGS: Bony pelvic ring is intact with no acute bony abnormality identified. Bilateral hips projects normally over the acetabula.  Proximal aspects of the bilateral femurs unremarkable.  No displaced fracture of the proximal left  femur identified. Left hip appears congruent.  Mild changes of osteoarthritis.  Incidental note made of transitional vertebral body of L6/S1, with likely ankylosis of the right L6 transverse process with the iliac bone.  IMPRESSION: No acute bony abnormality identified.  Signed,  Dulcy Fanny. Earleen Newport, DO  [facility]  [facility]   Electronically Signed   By: Corrie Mckusick D.O.   On: 10/02/2014 16:16    Microbiology: No results found for this or any previous visit (from the past 240 hour(s)).   Labs: Basic Metabolic Panel:  Recent Labs Lab 10/02/14 1615 10/03/14 0605 10/04/14 0439  NA 138 135 133*  K 3.9 3.6 4.0  CL 104 105 103  CO2 25 25 21   GLUCOSE 157* 123* 122*  BUN 14 10 13   CREATININE 0.89 0.78 0.71  CALCIUM 8.5 8.0* 8.2*  MG  --  2.0  --    Liver Function Tests:  Recent Labs Lab 10/02/14 1615 10/03/14 0605  AST 27 18  ALT 17 16  ALKPHOS 69 64  BILITOT 0.6 1.0  PROT 7.0 6.4  ALBUMIN 3.9 3.3*   No results for input(s): LIPASE, AMYLASE in the last 168 hours. No results for input(s): AMMONIA in the last 168 hours. CBC:  Recent Labs Lab 10/02/14 1615 10/03/14 0605 10/04/14 0439  WBC 9.7 8.1 8.5  NEUTROABS 7.1 4.9  --   HGB 12.0* 11.4* 12.8*  HCT 38.0* 36.4* 40.2  MCV 79.5 79.8 80.6  PLT 259 230 223   Cardiac Enzymes: No results for input(s): CKTOTAL, CKMB, CKMBINDEX, TROPONINI in the last 168 hours. BNP: BNP (last 3 results) No results for input(s): BNP in the last 8760 hours.  ProBNP (last 3 results) No results for input(s): PROBNP in the last 8760 hours.  CBG:  Recent Labs Lab  10/03/14 1115 10/03/14 1716 10/03/14 2211 10/04/14 0440 10/04/14 0901  GLUCAP 105* 103* 108* 112* 94       Signed:  Neena Beecham  [facility] 10/04/2014, 10:54 AM

## 2014-10-04 NOTE — Progress Notes (Signed)
Clinical Social Work Department BRIEF PSYCHOSOCIAL ASSESSMENT 10/04/2014  Patient:  Gerald Leach, Gerald Leach     Account Number:  [id number]     [place] date:  10/02/2014  Clinical Social Worker:  Renold Genta  Date/Time:  10/04/2014 10:58 AM  Referred by:  Physician  Date Referred:  10/04/2014 Referred for  SNF Placement   Other Referral:   Interview type:  Family Other interview type:   patient's son, [name] via phone    PSYCHOSOCIAL DATA Living Status:  WIFE Admitted from facility:   Level of care:   Primary support name:  Damonte Frieson (son) c#: [phone] Primary support relationship to patient:  CHILD, ADULT Degree of support available:   good    CURRENT CONCERNS Current Concerns  Post-Acute Placement   Other Concerns:    SOCIAL WORK ASSESSMENT / PLAN CSW received consult for SNF placement.   Assessment/plan status:  Information/Referral to [employer] Other assessment/ plan:   Information/referral to community resources:   CSW completed FL2 and faxed information out to [facility] - provided SNF bed offers.    PATIENT'S/FAMILY'S RESPONSE TO PLAN OF CARE: Patient's son informed CSW that patient had been to [facility] in the past and had a good experience. CSW confirmed with Abigail Butts at [facility] that they would be able to take patient back. Son, [name] aware. Anticipating discharge this afternoon.          Raynaldo Opitz, [facility] Clinical Social Worker cell #: [phone]

## 2014-10-04 NOTE — Progress Notes (Signed)
Report given to [facility], RN at [employer].  Patient to be picked up at 2pm by EMS per SW.  Will continue to monitor

## 2014-10-04 NOTE — Progress Notes (Signed)
OT Cancellation Note  Patient Details Name: Gerald Leach MRN: [id number] DOB: 04-Jan-1934   Cancelled Treatment:    Reason Eval/Treat Not Completed: Other (comment) Noted pt leaving for SNF. Will defer to SNF  [facility], Thereasa Parkin 10/04/2014, 1:42 PM

## 2014-10-04 NOTE — Progress Notes (Signed)
Patient alert oriented x 2, can carry conversation. Kept on taking tele off, and actually refused to put tele back on this time. On call NP  T Rogue Bussing  Notified-may leave tele off if pt refusing. Central tele made aware.

## 2014-10-04 NOTE — Progress Notes (Signed)
EMS came to pick patient up for transport to SNF.  Patient refused to go.  EMS has stated that they cannot take the patient since he was alert and oriented for them.  Dr. Coralyn Pear, patient's wife and son, and CSW aware.  Patient's wife has talked to patient, but he is still refusing.  Patient's son stated that he would attempt to talk to patient after he is done signing papers at [facility].  Will continue to monitor patient.

## 2014-10-04 NOTE — Progress Notes (Addendum)
Patient is set to discharge to [facility] today. Patient & son, Gerald Leach aware. Discharge packet given to RN, Archer Asa. [facility] called for 2:00 pickup for transport.   Clinical Social Work Department CLINICAL SOCIAL WORK PLACEMENT NOTE 10/04/2014  Patient:  Gerald Leach, Gerald Leach  Account Number:  [id number] [place] date:  10/02/2014  Clinical Social Worker:  Renold Genta  Date/time:  10/04/2014 11:04 AM  Clinical Social Work is seeking post-discharge placement for this patient at the following level of care:   SKILLED NURSING   (*CSW will update this form in Epic as items are completed)   10/04/2014  Patient/family provided with [place] Department of Clinical Social Work's list of facilities offering this level of care within the geographic area requested by the patient (or if unable, by the patient's family).  10/04/2014  Patient/family informed of their freedom to choose among providers that offer the needed level of care, that participate in Medicare, Medicaid or managed care program needed by the patient, have an available bed and are willing to accept the patient.  10/04/2014  Patient/family informed of [facility]' ownership interest in [facility], as well as of the fact that they are under no obligation to receive care at this facility.  PASARR submitted to EDS on 10/04/2014 PASARR number received on 10/04/2014  FL2 transmitted to all facilities in geographic area requested by pt/family on  10/04/2014 FL2 transmitted to all facilities within larger geographic area on   Patient informed that his/her managed care company has contracts with or will negotiate with  certain facilities, including the following:     Patient/family informed of bed offers received:  10/04/2014 Patient chooses bed at [facility] Physician recommends and patient chooses bed at    Patient to be transferred to [facility] on  10/04/2014 Patient to be  transferred to facility by [facility] Patient and family notified of transfer on 10/04/2014 Name of family member notified:  patient's son, Gerald Leach via phone  The following physician request were entered in Epic:   Additional Comments:     Raynaldo Opitz, [place] Social Worker cell #: [phone]

## 2014-10-04 NOTE — Progress Notes (Signed)
Physical Therapy Treatment Patient Details Name: Gerald Leach MRN: [id number] DOB: [birth year] Today's Date: 10/04/2014    History of Present Illness Gerald Leach is a 81 y.o. male adm 10/02/14 with Sz activity per pt wife; Past medical history of seizure disorder with grand mal seizures after subdural hematoma evacuation with craniotomy in 09/2012,  hypertension, hypothyroidism, diabetes mellitus.    PT Comments    Pt progressing; will likely need SNF post acute;   Follow Up Recommendations  SNF     Equipment Recommendations  None recommended by PT    Recommendations for Other Services       Precautions / Restrictions Precautions Precautions: Fall    Mobility  Bed Mobility Overal bed mobility: Needs Assistance Bed Mobility: Supine to Sit     Supine to sit: Supervision;Min guard     General bed mobility comments: pt incontinent of urine in bed; min/guard for safety when moving to EOB, cues for initation and task completion  Transfers Overall transfer level: Needs assistance Equipment used: None Transfers: Sit to/from Stand Sit to Stand: Min assist         General transfer comment: min assist for anterior-superior wt shift and to stabilize once standing; multi-modal cues for safety, hand placement adn to make sure chair  was there prior to sitting  Ambulation/Gait Ambulation/Gait assistance: Min guard;Min assist Ambulation Distance (Feet): 180 Feet Assistive device: 1 person hand held assist;2 person hand held assist Gait Pattern/deviations: Step-to pattern;Step-through pattern;Decreased step length - left;Decreased weight shift to left;Drifts right/left     General Gait Details: multi-modal cues for finding room, incr step length and posture; pt maintains LLE in external rotation and drags L foot at times; pt reports this is his baseline   Stairs            Wheelchair Mobility    Modified Rankin (Stroke Patients Only)       Balance Overall  balance assessment: Needs assistance;History of Falls         Standing balance support: Single extremity supported;No upper extremity supported;During functional activity Standing balance-Leahy Scale: Fair                      Cognition Arousal/Alertness: Awake/alert Behavior During Therapy: Flat affect;Impulsive   Area of Impairment: Memory;Following commands;Safety/judgement     Memory: Decreased short-term memory Following Commands: Follows one step commands with increased time Safety/Judgement: Decreased awareness of safety     General Comments: pt sits twice without being close to chair requriing quick reaction form PT staff to avoid falls    Exercises      General Comments        Pertinent Vitals/Pain Pain Assessment: Faces Faces Pain Scale: Hurts a little bit Pain Location: L hip Pain Intervention(s): Monitored during session;Repositioned    Home Living                      Prior Function            PT Goals (current goals can now be found in the care plan section) Acute Rehab PT Goals Patient Stated Goal: none stated PT Goal Formulation: Patient unable to participate in goal setting Time For Goal Achievement: 10/17/14 Potential to Achieve Goals: Good Progress towards PT goals: Progressing toward goals    Frequency  Min 3X/week    PT Plan Current plan remains appropriate    Co-evaluation  End of Session Equipment Utilized During Treatment: Gait belt Activity Tolerance: Patient tolerated treatment well Patient left: in chair;with call bell/phone within reach;with chair alarm set     Time: 1751-0258 PT Time Calculation (min) (ACUTE ONLY): 17 min  Charges:  $Gait Training: 8-22 mins                    G Codes:      Etherine Mackowiak 11-Oct-2014, 12:44 PM

## 2014-10-05 DIAGNOSIS — E119 Type 2 diabetes mellitus without complications: Secondary | ICD-10-CM | POA: Diagnosis not present

## 2014-10-05 DIAGNOSIS — G40909 Epilepsy, unspecified, not intractable, without status epilepticus: Secondary | ICD-10-CM | POA: Diagnosis not present

## 2014-10-05 DIAGNOSIS — E785 Hyperlipidemia, unspecified: Secondary | ICD-10-CM | POA: Diagnosis not present

## 2014-10-05 DIAGNOSIS — Z9114 Patient's other noncompliance with medication regimen: Secondary | ICD-10-CM | POA: Diagnosis not present

## 2014-10-05 DIAGNOSIS — R569 Unspecified convulsions: Secondary | ICD-10-CM | POA: Diagnosis not present

## 2014-10-05 DIAGNOSIS — K219 Gastro-esophageal reflux disease without esophagitis: Secondary | ICD-10-CM | POA: Diagnosis not present

## 2014-10-05 DIAGNOSIS — M6281 Muscle weakness (generalized): Secondary | ICD-10-CM | POA: Diagnosis not present

## 2014-10-05 DIAGNOSIS — E039 Hypothyroidism, unspecified: Secondary | ICD-10-CM | POA: Diagnosis not present

## 2014-10-20 ENCOUNTER — Telehealth: Payer: Self-pay | Admitting: *Deleted

## 2014-10-20 NOTE — Telephone Encounter (Signed)
OK to continue PT.  If they need another referral, please ask them to send.  Donika K. Posey Pronto, DO

## 2014-10-20 NOTE — Telephone Encounter (Signed)
Adonis Brook from [facility] called to see if [name] will continue his home haelth orders patient is currently in a facility but will be discharged on Friday Call back number [phone]

## 2014-10-20 NOTE — Telephone Encounter (Signed)
Verbal order given to continue with therapy.

## 2014-10-22 ENCOUNTER — Telehealth: Payer: Self-pay | Admitting: Family Medicine

## 2014-10-22 DIAGNOSIS — M6281 Muscle weakness (generalized): Secondary | ICD-10-CM | POA: Diagnosis not present

## 2014-10-22 DIAGNOSIS — R262 Difficulty in walking, not elsewhere classified: Secondary | ICD-10-CM | POA: Diagnosis not present

## 2014-10-22 NOTE — Telephone Encounter (Signed)
Morey Hummingbird from [facility]. Wanted to report that she was at patient's home for PT and he had stubbed his left toe & it was bleeding, she did apply a compression dressing and advised patient to go to an Urgent Care if it didn't stop bleeding.  She also wants order for nursing for the patient. She states she was told to call [name] since [name] is the doctor that would be singing his home health orders. Callback [phone].

## 2014-10-25 DIAGNOSIS — R262 Difficulty in walking, not elsewhere classified: Secondary | ICD-10-CM | POA: Diagnosis not present

## 2014-10-25 DIAGNOSIS — M6281 Muscle weakness (generalized): Secondary | ICD-10-CM | POA: Diagnosis not present

## 2014-10-25 NOTE — Telephone Encounter (Signed)
I spoke with Gerald Leach and she said that they will fax over the [phone] with orders for nursing and Education officer, museum.

## 2014-10-26 DIAGNOSIS — R262 Difficulty in walking, not elsewhere classified: Secondary | ICD-10-CM | POA: Diagnosis not present

## 2014-10-26 DIAGNOSIS — M6281 Muscle weakness (generalized): Secondary | ICD-10-CM | POA: Diagnosis not present

## 2014-10-28 DIAGNOSIS — M6281 Muscle weakness (generalized): Secondary | ICD-10-CM | POA: Diagnosis not present

## 2014-10-28 DIAGNOSIS — R262 Difficulty in walking, not elsewhere classified: Secondary | ICD-10-CM | POA: Diagnosis not present

## 2014-10-29 DIAGNOSIS — R262 Difficulty in walking, not elsewhere classified: Secondary | ICD-10-CM | POA: Diagnosis not present

## 2014-10-29 DIAGNOSIS — M6281 Muscle weakness (generalized): Secondary | ICD-10-CM | POA: Diagnosis not present

## 2014-11-01 ENCOUNTER — Ambulatory Visit: Payer: Medicare Other | Admitting: Family Medicine

## 2014-11-01 ENCOUNTER — Telehealth: Payer: Self-pay | Admitting: Neurology

## 2014-11-01 DIAGNOSIS — Z0289 Encounter for other administrative examinations: Secondary | ICD-10-CM

## 2014-11-01 DIAGNOSIS — R262 Difficulty in walking, not elsewhere classified: Secondary | ICD-10-CM | POA: Diagnosis not present

## 2014-11-01 DIAGNOSIS — M6281 Muscle weakness (generalized): Secondary | ICD-10-CM | POA: Diagnosis not present

## 2014-11-01 NOTE — Telephone Encounter (Signed)
Noted  

## 2014-11-01 NOTE — Telephone Encounter (Signed)
Don w/ [facility] did home visit w/ patient on 10/25/14 for OT. Plan of care = will go to home 1x per week x 4 weeks for OT to include transferring, ADL, balance training, DM foot care, and equipment training. Please call Noni Saupe with any questions regarding plan, CB# [phone] / Sherri S.

## 2014-11-01 NOTE — Telephone Encounter (Signed)
FYI

## 2014-11-02 ENCOUNTER — Telehealth: Payer: Self-pay | Admitting: Family Medicine

## 2014-11-02 ENCOUNTER — Telehealth: Payer: Self-pay | Admitting: *Deleted

## 2014-11-02 DIAGNOSIS — M6281 Muscle weakness (generalized): Secondary | ICD-10-CM | POA: Diagnosis not present

## 2014-11-02 DIAGNOSIS — R262 Difficulty in walking, not elsewhere classified: Secondary | ICD-10-CM | POA: Diagnosis not present

## 2014-11-02 NOTE — Telephone Encounter (Signed)
Attempted to call Heather back but her voicemail has not been set up yet.

## 2014-11-02 NOTE — Telephone Encounter (Signed)
Patient should be seen by Georgina Snell per Dr [name]

## 2014-11-02 NOTE — Telephone Encounter (Signed)
Home health nurse call in reference to patients medication Call back number [phone] Gerald Leach with Gerald Leach)

## 2014-11-02 NOTE — Telephone Encounter (Signed)
[facility] needs to know what his last A1C was and is he supposed to be checking them at home? They would like a podiatry appt for a nail trim and they see him for a toe injury that could possibly be infected. The redness is increasing and he is only using antibiotic ointment and keeping it covered. He may need to seen to evaluate. Son will most likely need to coordinate his appointments.

## 2014-11-02 NOTE — Telephone Encounter (Signed)
Attempted to call Heather back again.  No VM to leave message.

## 2014-11-02 NOTE — Telephone Encounter (Signed)
Pt did not show up for appt on 11/01/14 said they were not aware of the appt. Pt called today to schedule an appt.where do you think I can schedule pt for a 30 minute  Or do he need to see someone else.

## 2014-11-03 ENCOUNTER — Ambulatory Visit: Payer: Medicare Other | Admitting: Family Medicine

## 2014-11-03 ENCOUNTER — Telehealth: Payer: Self-pay | Admitting: Family Medicine

## 2014-11-03 DIAGNOSIS — Z0289 Encounter for other administrative examinations: Secondary | ICD-10-CM

## 2014-11-03 NOTE — Telephone Encounter (Signed)
Gerald Leach came in after 12 o'clock. Dr. [name] isn't in the office and I was advised to have you call Gerald Leach to reschedule his appointment.

## 2014-11-04 ENCOUNTER — Ambulatory Visit: Payer: Medicare Other | Admitting: Neurology

## 2014-11-04 DIAGNOSIS — R262 Difficulty in walking, not elsewhere classified: Secondary | ICD-10-CM | POA: Diagnosis not present

## 2014-11-04 DIAGNOSIS — M6281 Muscle weakness (generalized): Secondary | ICD-10-CM | POA: Diagnosis not present

## 2014-11-04 NOTE — Telephone Encounter (Signed)
I have attempted to contact patient's home health nurse with no success.  Patient is coming in today.  I will go over his medications at that time and send updated copy with him.

## 2014-11-05 ENCOUNTER — Encounter: Payer: Self-pay | Admitting: Neurology

## 2014-11-05 ENCOUNTER — Ambulatory Visit (INDEPENDENT_AMBULATORY_CARE_PROVIDER_SITE_OTHER): Payer: Medicare Other | Admitting: Adult Health

## 2014-11-05 ENCOUNTER — Encounter: Payer: Self-pay | Admitting: Adult Health

## 2014-11-05 ENCOUNTER — Ambulatory Visit (INDEPENDENT_AMBULATORY_CARE_PROVIDER_SITE_OTHER): Payer: Medicare Other | Admitting: Neurology

## 2014-11-05 VITALS — BP 102/70 | Temp 98.6°F | Ht 71.0 in | Wt 191.0 lb

## 2014-11-05 VITALS — BP 130/80 | HR 56 | Ht 71.0 in | Wt 190.4 lb

## 2014-11-05 DIAGNOSIS — L97529 Non-pressure chronic ulcer of other part of left foot with unspecified severity: Secondary | ICD-10-CM

## 2014-11-05 DIAGNOSIS — E11621 Type 2 diabetes mellitus with foot ulcer: Secondary | ICD-10-CM | POA: Diagnosis not present

## 2014-11-05 DIAGNOSIS — G40909 Epilepsy, unspecified, not intractable, without status epilepticus: Secondary | ICD-10-CM

## 2014-11-05 DIAGNOSIS — F0390 Unspecified dementia without behavioral disturbance: Secondary | ICD-10-CM | POA: Diagnosis not present

## 2014-11-05 DIAGNOSIS — I671 Cerebral aneurysm, nonruptured: Secondary | ICD-10-CM | POA: Diagnosis not present

## 2014-11-05 DIAGNOSIS — R569 Unspecified convulsions: Secondary | ICD-10-CM | POA: Diagnosis not present

## 2014-11-05 DIAGNOSIS — R4189 Other symptoms and signs involving cognitive functions and awareness: Secondary | ICD-10-CM | POA: Diagnosis not present

## 2014-11-05 DIAGNOSIS — E538 Deficiency of other specified B group vitamins: Secondary | ICD-10-CM

## 2014-11-05 MED ORDER — SULFAMETHOXAZOLE-TRIMETHOPRIM 800-160 MG PO TABS: 1.0000 | ORAL_TABLET | Freq: Two times a day (BID) | ORAL | Status: DC

## 2014-11-05 MED ORDER — LEVETIRACETAM 750 MG PO TABS: 1500.0000 mg | ORAL_TABLET | Freq: Two times a day (BID) | ORAL | Status: DC

## 2014-11-05 MED ORDER — DIVALPROEX SODIUM 500 MG PO DR TAB: 500.0000 mg | DELAYED_RELEASE_TABLET | Freq: Two times a day (BID) | ORAL | Status: DC

## 2014-11-05 NOTE — Progress Notes (Signed)
Follow-up Visit   Date: 11/05/2014   Gerald Leach MRN: [id number] DOB: 1934-01-12   Interim History: Gerald Leach is a 81 y.o. caucasian male with hypertension, hyperlipidemia, GERD, DM, CNS aneurysm s/p coiling ([facility], 2002), and SDH s/p craniotomy (10/6431) complicated by seizure disorder with residual left sided weakness and facial droop returning to the clinic for follow-up of seizures.  The patient was accompanied to the clinic by son.  History of present illness: Patient reports noticing problems with his gait following his SDH. In April 2014, he sustained a fall and became acutely encephalopathic. He was admitted from 4/3 - 09/30/2012 for left SDH and underwent evacuation x 2 and placed on seizure prophylaxis with keppra. He was eventually discharged to rehab facility for TBI. Since he was doing well, in July 2014, trial of weaning Keppra was performed. However, on 8/19 he fell over the front porch chair while climbing the stairs and when his wife went to see him, she noticed his arm was shaking. She reports noticing brief jerking of the left arm the previous evening, too, but it only lasted a few minutes.   Regarding his gait, he reports noticing problems only after Keppra was starting but he also had a SDH at the same time. Looking back at Epic notes, it appears that even in 02/2012 he had a fall with severe facial hematoma and fractured T7 vertebra, so it is likely that symptoms have been longer than what he describes. He had myelogram which shoed high-grade stenosis at T7 and was evaluated by Dr. Ronnald Ramp, neurosurgeon. They advised that a watchful waiting or surgery and he elected the conservative option. He feels as if his balance is "off" and very unsteady. He has been walking with a cane since April. He has completed in-patient rehab already for gait problems. He reports urinary urge incontinence.   11/4 Follow-up: Labs indicated he 12 deficiency and EMG was consistent with a  moderate generalized sensorimotor polyneuropathy. B12 supplementation was started. Of note, on 10/29 he had several grand mal seizures and was taken to the hospital. Keppra increased to 1000mg  BID.  05/05/2013 Follow-up:  No interval seizures. Continue LEV 1000mg  BID.  05/18/2014:   Patient was last seen > 1 year ago.  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  He reports to being compliant with his medications.  He is not driving.  He retired in September 2015.  No seizures since August and he has no new complaints.  Gait remains unsteady and he is walking with a cane.    UPDATE 08/17/2014:  Soon after his last clinic visit, his son called and told us that patient actually had 3 seizures within 3 weeks, which the paitent did not disclose to [place].  Therefore, his Keppra was increased to 1500mg  BID and he reports being compliant with it.  No seizures since his last visit.  No new complaints.  He is walking with a cane and feels that it helps a lot.  No interval falls.  Because his wife has dementia, patient is eating mostly frozen foods.  He does get in arguments frequently with his wife, especially when she asks the same question repeatedly.  They have assigned their son  as POA, but do not have Advanced Directives in place.  UPDATE 11/05/2014:  Gerald Leach was admitted to [facility] with breakthrough seizures, likely in the setting of medication noncompliance as his keppra level on admission was not detected. He reportedly had several seizures at home prior to presentation and another one in the emergency department. Keppra 1500mg  twice daily was restarted and he was loaded with Depakote 1000mg   and started on 500mg  twice daily.  Repeat MRI brain did not show any new findings, known lobulated giant right ICA terminus aneurysm appears stable encompassing 30 x 18 x 21 mm.  He was discharged to rehab facility and has been home for the past 2 weeks with home PT and home health.  No seizures in the past month.    Son reports noticing that patient is having memory problems and getting lost at times.  He was not able to make his PCP's visit because of getting lost and arriving late.  He has accrued late fees on some of his bills.  Regarding his last admission, his son noticed that his Keppra was in the medicine bottle, but had not been placed in his pillbox, so suspected that he had not been taking it.  He has not set up POA and still manages his own finances.  He is not driving.  Home health now comes to manage his medications.      Medications:  Current Outpatient Prescriptions on File Prior to Visit  Medication Sig Dispense Refill  . levothyroxine (SYNTHROID, LEVOTHROID) 50 MCG tablet Take 1 tablet (50 mcg total) by mouth daily before breakfast. 100 tablet 3  . metFORMIN (GLUCOPHAGE) 500 MG tablet TAKE 1 TABLET BY MOUTH 2 TIMES DAILY WITH A MEAL. (Patient taking differently: Take 500 mg by mouth 2 (two) times daily with a meal. ) 200 tablet 3  . Multiple Vitamin (MULTIVITAMIN WITH MINERALS) TABS tablet Take 1 tablet by mouth 2 (two) times daily.     Marland Kitchen omeprazole (PRILOSEC) 20 MG capsule Take 1 capsule (20 mg total) by mouth every Monday, Wednesday, and Friday. 100 capsule 3  . ONE TOUCH LANCETS MISC 1 each by Does not apply route daily. 200 each 0  . simvastatin (ZOCOR) 10 MG tablet Take 1 tablet (10 mg total) by mouth at bedtime. 100 tablet 3   No current facility-administered medications on file prior to visit.    Allergies: No Known Allergies   Review of Systems:  CONSTITUTIONAL: No fevers, chills, night sweats, or weight loss.   EYES: No visual changes or eye pain ENT: No hearing  changes.  No history of nose bleeds.   RESPIRATORY: No cough, wheezing and shortness of breath.   CARDIOVASCULAR: Negative for chest pain, and palpitations.   GI: Negative for abdominal discomfort, blood in stools or black stools.  No recent change in bowel habits.   GU:  No history of incontinence.   MUSCLOSKELETAL: No history of joint pain or swelling.  No myalgias.   SKIN: Negative for lesions, rash, and itching.   ENDOCRINE: Negative for cold or heat intolerance, polydipsia or goiter.   PSYCH:  No depression or anxiety symptoms.   NEURO: As Above.   Vital Signs:  BP 130/80 mmHg  Pulse 56  Ht 5\' 11"  (1.803 m)  Wt 190 lb 6 oz (86.354 kg)  BMI 26.56 kg/m2  SpO2 98%   Neurological Exam: MENTAL STATUS:  Unshaven, dressed appropriately.  He is slow to process one-step commands and has to be asked  repeatedly at times.  He is oriented to person, place, year, and month.   Montreal Cognitive Assessment  11/05/2014  Visuospatial/ Executive (0/5) 1  Naming (0/3) 3  Attention: Read list of digits (0/2) 2  Attention: Read list of letters (0/1) 1  Attention: Serial 7 subtraction starting at 100 (0/3) 3  Language: Repeat phrase (0/2) 2  Language : Fluency (0/1) 0  Abstraction (0/2) 2  Delayed Recall (0/5) 0  Orientation (0/6) 5  Total 19  Adjusted Score (based on education) 19     CRANIAL NERVES: Pupils equal round and reactive to light.  Restricted upgaze bilaterally, otherwise extra-ocular eye movements intact. Normal facial sensation.  Face is symmetric. Palate elevates symmetrically.  Tongue is midline.  MOTOR:  Motor strength is 5/5 throughout except 5-/5 instrinsic hand muscles and left hip flexion. Tone is normal.    COORDINATION/GAIT:  Intact rapid alternating movements bilaterally, except mild toe tapping on the left.  He is able to stand without using arms to push off the chair.  Gait appears stooped, slow, and somewhat ataxic.  Data: MRI brain wo contrast 10/03/2014: 1.  Stable.  No acute intracranial abnormality. 2. Sequelae of right ICA occlusion with stable thrombosed appearing giant right ICA terminus aneurysm up to 3 cm. 3. 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 Sequelae of right frontal craniotomy. 4. Cerebral volume loss with suspected ex vacuo ventricle enlargement and nonspecific chronic white matter signal changes.  MRI/A brain 05/01/2013: No acute finding. No evidence of residual or recurrent subdural collection.  Generalized brain atrophy and extensive chronic small vessel changes throughout the brain. Chronic right internal carotid artery occlusion. 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  2.5 x 1.7 x 2.1 cm extra axial lesion projecting upward from the clinoid process on the right, indenting the inferior frontal lobe. Mild adjacent gliosis. This lesion is felt to represent a chronic meningioma.  Right internal carotid artery occlusion. Hyperdense entity in the pre clinoid ICA on the CT examinations looks like a foreign object. Flow through a patent anterior communicating artery allows supply to the right hemisphere. I think there is probably a fenestrated anterior communicating artery. Difficult to completely exclude a small anterior communicating artery aneurysm, but I do not favor that. If present, a would be no larger than 4 mm.  EMG 04/13/2013: 1. Generalized large fiber sensorimotor polyneuropathy affecting the left side; moderate in degree electrically. A mild superimposed L5-S1 radiculopathy cannot be excluded. 2. Left median neuropathy at or distal to the wrist consistent with the clinical diagnosis of carpal tunnel syndrome, overall these changes are mild-moderate in degree electrically. 3. Mild left ulnar neuropathy at the elbow with purely demyelinating features evidenced as conduction velocity slowing.   Lab Results  Component Value Date   TSH 5.026* 10/02/2014   Lab Results  Component Value Date     HGBA1C 6.4* 10/02/2014   Lab Results  Component Value Date   VITAMINB12 256 05/18/2014    IMPRESSION/PLAN: 1. Seizure disorder, history of subdural hematoma 09/2012 with recent breakthrough seizure in setting of medication noncompliance - Started for seizure prophylaxis and subsequent breakthrough seizures (04/15/2013, 02/05/2014, 06/02/2015)  - Breakthrough seizure in April in the setting of medication noncompliance.  VPA added to his regimen, which I will continue for now  - Continue Keppra 1500mg  BID and depakote 500mg  twice daily - refills provided for one year  - Check LEV and VPA levels  - Seizure precautions including no driving was discussed  2.  Cognitive impairment, likely due to multifactorial dementia [facility] 19/30) - new issue  - Check vitamin E and vitamin B12  - Encouraged son to set up POA   - Consider starting aricept at next visit if labs return normal   3. Multifactorial gait disorder s/p falls, secondary to large fiber polyneuropathy and lumbosacral radiculopathy   - Recommended using rollator   4. Vitamin B12 deficiency  - Stopped taking injections in November 2014  - Vitamin B12 256 in December 2015, recommended vitamin B12 1074mcg daily  5. Home safety issues discussed at length  - with his new cognitive problems and medicatoin compliance, along with his wife having dementia, he would be better suited in assisted living  - Currently getting home health to manage medications  6. Right clinoid calcified meningioma vs thrombosed giant ICA aneurysm  7. High grade T7 central canal stenosis with T7 vertebral fracture s/p fall (02/2012) - Previously seen Dr. Ronnald Ramp in Neurosurgery, patient elected conservative management  8. History of SDH in 09/2012 s/p evacuation  Return to clinic in 49-months   The duration of this appointment visit was 30 minutes of face-to-face time with the patient.  Greater than 50% of this time was spent in  counseling, explanation of diagnosis, planning of further management, and coordination of care.   Thank you for allowing me to participate in patient's care.  If I can answer any additional questions, I would be pleased to do so.    Sincerely,    Rori Goar K. Posey Pronto, DO

## 2014-11-05 NOTE — Patient Instructions (Signed)
1.  Check blood work 2.  Continue keppra 1500mg  twice daily and depakote 500mg  twice daily 3.  Recommend setting up power of attorney especially with new memory and cognitive problems, as well as looking into skilled nursing facilities 4.  Return to clinic in 3 months

## 2014-11-05 NOTE — Progress Notes (Signed)
   Subjective:    [name], male    DOB: 30-Aug-1933, 81 y.o.   MRN: [id number]  HPI  [name] presents to the office to wound to his left second toe. He sustained this injury while at physical therapy at the beginning of the week. He denies any pain, but came to the office today because his toe was becoming more red. He has been putting neosporin on the wound while at home.   His blood sugars have been controlled and his last A1c on 10/02/2014 was 6.4.     Review of Systems  Constitutional: Negative for fever and chills.  Skin: Positive for color change and wound.  All other systems reviewed and are negative.      Objective:   Physical Exam  Constitutional: He is oriented to person, place, and time. He appears well-developed and well-nourished. No distress.  Musculoskeletal:  Uses cane to walk  Neurological: He is alert and oriented to person, place, and time.  Skin: Skin is warm and dry. He is not diaphoretic.  Wound to tip of left second toe. His toe nail as fallen off.Scab formed.  No drainage from the wound. The toe is red an slightly swollen. Not warm. No pain with palpation.    Nursing note and vitals reviewed.      Assessment & Plan:  1. Diabetic ulcer of left foot associated with type 2 diabetes mellitus - Follow up with me on Monday May 23rd for recheck - sulfamethoxazole-trimethoprim (BACTRIM DS) 800-160 MG per tablet; Take 1 tablet by mouth 2 (two) times daily.  Dispense: 28 tablet; Refill: 0 - Advised to go to to ER if toe appears to be becoming worse.  - Possible consult to Gen Surg

## 2014-11-05 NOTE — Progress Notes (Signed)
Pre visit review using our clinic review tool, if applicable. No additional management support is needed unless otherwise documented below in the visit note. 

## 2014-11-05 NOTE — Patient Instructions (Signed)
I have sent a prescription to the pharmacy for Bactrim. This is an antibiotic for your toe injury. Please take it twice a day until it is gone. Follow up with my on Monday. If you notice that the toe is getting worse over the weekend, then please go to the ER

## 2014-11-06 LAB — VALPROIC ACID LEVEL: VALPROIC ACID LVL: 83.1 ug/mL (ref 50.0–100.0)

## 2014-11-06 LAB — VITAMIN B12: Vitamin B-12: 444 pg/mL (ref 211–911)

## 2014-11-08 ENCOUNTER — Ambulatory Visit: Payer: Medicare Other | Admitting: Adult Health

## 2014-11-08 DIAGNOSIS — M6281 Muscle weakness (generalized): Secondary | ICD-10-CM | POA: Diagnosis not present

## 2014-11-08 DIAGNOSIS — R262 Difficulty in walking, not elsewhere classified: Secondary | ICD-10-CM | POA: Diagnosis not present

## 2014-11-08 LAB — LAMOTRIGINE LEVEL: Lamotrigine Lvl: <0.5 ug/mL — ABNORMAL LOW (ref 4.0–18.0)

## 2014-11-09 ENCOUNTER — Telehealth: Payer: Self-pay

## 2014-11-09 ENCOUNTER — Ambulatory Visit (INDEPENDENT_AMBULATORY_CARE_PROVIDER_SITE_OTHER): Payer: Medicare Other | Admitting: Adult Health

## 2014-11-09 ENCOUNTER — Encounter: Payer: Self-pay | Admitting: Adult Health

## 2014-11-09 ENCOUNTER — Ambulatory Visit (INDEPENDENT_AMBULATORY_CARE_PROVIDER_SITE_OTHER)
Admission: RE | Admit: 2014-11-09 | Discharge: 2014-11-09 | Disposition: A | Discharge status: Home / Self Care | Payer: Medicare Other | Source: Ambulatory Visit | Attending: Adult Health | Admitting: Adult Health

## 2014-11-09 VITALS — BP 100/68 | Temp 97.8°F | Wt 190.0 lb

## 2014-11-09 DIAGNOSIS — L97529 Non-pressure chronic ulcer of other part of left foot with unspecified severity: Secondary | ICD-10-CM | POA: Diagnosis not present

## 2014-11-09 DIAGNOSIS — Z5189 Encounter for other specified aftercare: Secondary | ICD-10-CM

## 2014-11-09 DIAGNOSIS — S91105A Unspecified open wound of left lesser toe(s) without damage to nail, initial encounter: Secondary | ICD-10-CM | POA: Diagnosis not present

## 2014-11-09 DIAGNOSIS — E11621 Type 2 diabetes mellitus with foot ulcer: Secondary | ICD-10-CM | POA: Diagnosis not present

## 2014-11-09 LAB — VITAMIN E
GAMMA-TOCOPHEROL (VIT E): 1.5 mg/L (ref <=4.3)
VITAMIN E (ALPHA TOCOPHEROL): 10 mg/L (ref 5.7–19.9)

## 2014-11-09 MED ORDER — AMOXICILLIN-POT CLAVULANATE 875-125 MG PO TABS: 1.0000 | ORAL_TABLET | Freq: Two times a day (BID) | ORAL | Status: DC

## 2014-11-09 NOTE — Progress Notes (Signed)
   Subjective:    [name], male    DOB: 07/19/1933, 81 y.o.   MRN: [id number]  HPI  Gerald Leach presents to the office today for follow up of wound to left second toe. He was started on Bactrim DS BID for 14 days. On follow up he does not endorse any change in the status of his toe.     Review of Systems  Skin: Positive for color change and wound.  All other systems reviewed and are negative.      Objective:   Physical Exam  Constitutional: He appears well-developed and well-nourished. No distress.  Cardiovascular: Normal rate, regular rhythm, normal heart sounds and intact distal pulses.  Exam reveals no gallop and no friction rub.   No murmur heard. Pulmonary/Chest: Effort normal and breath sounds normal.  Musculoskeletal: Normal range of motion.  Skin: Skin is warm and dry. He is not diaphoretic. There is erythema.  Wound does not appear to be healing well. Continue to have non draining wound on distal tip of left second toe. Left second toe continues to be red and swollen with warmth   Left great toe red, warmth and swollen.   Distal pulses ok  Nursing note and vitals reviewed.      Assessment & Plan:   1. Visit for wound check - amoxicillin-clavulanate (AUGMENTIN) 875-125 MG per tablet; Take 1 tablet by mouth 2 (two) times daily.  Dispense: 28 tablet; Refill: 0 - DG Foot Complete Left; Future - AMB referral to wound care center - Continue taking Bactrim - Follow up Friday at 10:30 am

## 2014-11-09 NOTE — Telephone Encounter (Signed)
Heather from [facility] called to see if there any new wound care changes for pt.  Advised that per Tommi Rumps' s note pt was given an additional abx to take along with the ones he was currently on.  A referral for wound care was placed as well.  Until pt can be seen by Wound Care pt should keep the area clean, use neosporin, and a loose dry bandage.  Nira Conn has some concerns about pt and his spouse.  Pt and wife are not safe to be home alone, there is an increase risk of falls especially since their bedroom is on the 2nd floor.  Pt was out with pt this week and noticed that pt was dragging his left foot more and he was tripping over the rugs throughout the house.  One of the smaller rugs was removed but the larger remain in the home.,  The social worker has been out and they have noticed a quick decline in pt and his spouses' mental ability. Heather empathized the fact that the pt and his spouse are not safe at home and need to be observed 24/7 or have frequent observation.

## 2014-11-09 NOTE — Progress Notes (Signed)
Pre visit review using our clinic review tool, if applicable. No additional management support is needed unless otherwise documented below in the visit note. 

## 2014-11-09 NOTE — Patient Instructions (Addendum)
Continue to take the antibiotics that were prescribed as well as the new antibiotics.  Go get an x ray of your foot. Wound care will call you to make an appointment. Follow up with me on Friday.

## 2014-11-10 DIAGNOSIS — R262 Difficulty in walking, not elsewhere classified: Secondary | ICD-10-CM | POA: Diagnosis not present

## 2014-11-10 DIAGNOSIS — M6281 Muscle weakness (generalized): Secondary | ICD-10-CM | POA: Diagnosis not present

## 2014-11-10 NOTE — Telephone Encounter (Signed)
Per Dr [name] - Dr [name] did not order home health, however he does agree that patient should be in a skilled nursing facility.  If patient declines then they should call 911. Left detailed message on machine for Heather from [place].

## 2014-11-11 ENCOUNTER — Encounter (HOSPITAL_BASED_OUTPATIENT_CLINIC_OR_DEPARTMENT_OTHER): Payer: Medicare Other | Attending: Plastic Surgery

## 2014-11-11 DIAGNOSIS — E11621 Type 2 diabetes mellitus with foot ulcer: Secondary | ICD-10-CM | POA: Insufficient documentation

## 2014-11-11 DIAGNOSIS — I10 Essential (primary) hypertension: Secondary | ICD-10-CM | POA: Insufficient documentation

## 2014-11-11 DIAGNOSIS — G40909 Epilepsy, unspecified, not intractable, without status epilepticus: Secondary | ICD-10-CM | POA: Insufficient documentation

## 2014-11-11 DIAGNOSIS — I69354 Hemiplegia and hemiparesis following cerebral infarction affecting left non-dominant side: Secondary | ICD-10-CM | POA: Diagnosis not present

## 2014-11-11 DIAGNOSIS — L97521 Non-pressure chronic ulcer of other part of left foot limited to breakdown of skin: Secondary | ICD-10-CM | POA: Insufficient documentation

## 2014-11-11 DIAGNOSIS — I69392 Facial weakness following cerebral infarction: Secondary | ICD-10-CM | POA: Diagnosis not present

## 2014-11-12 ENCOUNTER — Ambulatory Visit: Payer: Medicare Other | Admitting: Adult Health

## 2014-11-12 DIAGNOSIS — R262 Difficulty in walking, not elsewhere classified: Secondary | ICD-10-CM | POA: Diagnosis not present

## 2014-11-12 DIAGNOSIS — M6281 Muscle weakness (generalized): Secondary | ICD-10-CM | POA: Diagnosis not present

## 2014-11-16 DIAGNOSIS — M6281 Muscle weakness (generalized): Secondary | ICD-10-CM | POA: Diagnosis not present

## 2014-11-16 DIAGNOSIS — R262 Difficulty in walking, not elsewhere classified: Secondary | ICD-10-CM | POA: Diagnosis not present

## 2014-11-17 DIAGNOSIS — M6281 Muscle weakness (generalized): Secondary | ICD-10-CM | POA: Diagnosis not present

## 2014-11-17 DIAGNOSIS — R262 Difficulty in walking, not elsewhere classified: Secondary | ICD-10-CM | POA: Diagnosis not present

## 2014-11-18 ENCOUNTER — Encounter (HOSPITAL_BASED_OUTPATIENT_CLINIC_OR_DEPARTMENT_OTHER): Payer: Medicare Other | Attending: Internal Medicine

## 2014-11-18 DIAGNOSIS — L97521 Non-pressure chronic ulcer of other part of left foot limited to breakdown of skin: Secondary | ICD-10-CM | POA: Diagnosis not present

## 2014-11-18 DIAGNOSIS — E785 Hyperlipidemia, unspecified: Secondary | ICD-10-CM | POA: Insufficient documentation

## 2014-11-18 DIAGNOSIS — K219 Gastro-esophageal reflux disease without esophagitis: Secondary | ICD-10-CM | POA: Diagnosis not present

## 2014-11-18 DIAGNOSIS — M7989 Other specified soft tissue disorders: Secondary | ICD-10-CM | POA: Diagnosis not present

## 2014-11-18 DIAGNOSIS — E11621 Type 2 diabetes mellitus with foot ulcer: Secondary | ICD-10-CM | POA: Insufficient documentation

## 2014-11-18 DIAGNOSIS — G40909 Epilepsy, unspecified, not intractable, without status epilepticus: Secondary | ICD-10-CM | POA: Diagnosis not present

## 2014-11-18 DIAGNOSIS — I159 Secondary hypertension, unspecified: Secondary | ICD-10-CM | POA: Insufficient documentation

## 2014-11-19 ENCOUNTER — Telehealth: Payer: Self-pay | Admitting: Neurology

## 2014-11-19 DIAGNOSIS — R262 Difficulty in walking, not elsewhere classified: Secondary | ICD-10-CM | POA: Diagnosis not present

## 2014-11-19 DIAGNOSIS — M6281 Muscle weakness (generalized): Secondary | ICD-10-CM | POA: Diagnosis not present

## 2014-11-19 NOTE — Telephone Encounter (Signed)
Attempted to call Don back.  Left message that it is ok to do extra visits.  Requested for him to fax order over on Monday for [name] to sign.

## 2014-11-19 NOTE — Telephone Encounter (Signed)
Caprice Red, Occ. Therapist called and wanted to get some additional visits approved for pt, please call back [phone]

## 2014-11-23 ENCOUNTER — Telehealth: Payer: Self-pay | Admitting: Neurology

## 2014-11-23 DIAGNOSIS — M6281 Muscle weakness (generalized): Secondary | ICD-10-CM | POA: Diagnosis not present

## 2014-11-23 DIAGNOSIS — R262 Difficulty in walking, not elsewhere classified: Secondary | ICD-10-CM | POA: Diagnosis not present

## 2014-11-23 NOTE — Telephone Encounter (Signed)
Heather w/ [facility] called, needs visit orders to continue wound care monitoring, needs 2 times per week x1, then 1x per week for the next 2 weeks, then d/c. CB# [phone] / Sherri S.

## 2014-11-23 NOTE — Telephone Encounter (Signed)
LM for Heather giving v/o to continue wound care and requested for her to fax the order.

## 2014-11-25 ENCOUNTER — Ambulatory Visit (HOSPITAL_COMMUNITY)
Admission: RE | Admit: 2014-11-25 | Discharge: 2014-11-25 | Disposition: A | Discharge status: Home / Self Care | Payer: Medicare Other | Source: Ambulatory Visit | Attending: Vascular Surgery | Admitting: Vascular Surgery

## 2014-11-25 ENCOUNTER — Encounter (HOSPITAL_COMMUNITY): Payer: Medicare Other

## 2014-11-25 ENCOUNTER — Telehealth: Payer: Self-pay | Admitting: Neurology

## 2014-11-25 ENCOUNTER — Other Ambulatory Visit: Payer: Self-pay | Admitting: Internal Medicine

## 2014-11-25 DIAGNOSIS — E119 Type 2 diabetes mellitus without complications: Secondary | ICD-10-CM | POA: Insufficient documentation

## 2014-11-25 DIAGNOSIS — I739 Peripheral vascular disease, unspecified: Secondary | ICD-10-CM

## 2014-11-25 DIAGNOSIS — R262 Difficulty in walking, not elsewhere classified: Secondary | ICD-10-CM | POA: Diagnosis not present

## 2014-11-25 DIAGNOSIS — M6281 Muscle weakness (generalized): Secondary | ICD-10-CM | POA: Diagnosis not present

## 2014-11-25 NOTE — Telephone Encounter (Signed)
Noted  

## 2014-11-25 NOTE — Telephone Encounter (Signed)
FYI

## 2014-11-25 NOTE — Telephone Encounter (Signed)
Morey Hummingbird called from [facility] to let Dr Posey Pronto know that pt has declined his physical therapy/Dawn CB# [phone]

## 2014-11-29 DIAGNOSIS — R262 Difficulty in walking, not elsewhere classified: Secondary | ICD-10-CM | POA: Diagnosis not present

## 2014-11-29 DIAGNOSIS — M6281 Muscle weakness (generalized): Secondary | ICD-10-CM | POA: Diagnosis not present

## 2014-12-02 DIAGNOSIS — E785 Hyperlipidemia, unspecified: Secondary | ICD-10-CM | POA: Diagnosis not present

## 2014-12-02 DIAGNOSIS — L97521 Non-pressure chronic ulcer of other part of left foot limited to breakdown of skin: Secondary | ICD-10-CM | POA: Diagnosis not present

## 2014-12-02 DIAGNOSIS — G40909 Epilepsy, unspecified, not intractable, without status epilepticus: Secondary | ICD-10-CM | POA: Diagnosis not present

## 2014-12-02 DIAGNOSIS — K219 Gastro-esophageal reflux disease without esophagitis: Secondary | ICD-10-CM | POA: Diagnosis not present

## 2014-12-02 DIAGNOSIS — I159 Secondary hypertension, unspecified: Secondary | ICD-10-CM | POA: Diagnosis not present

## 2014-12-02 DIAGNOSIS — M7989 Other specified soft tissue disorders: Secondary | ICD-10-CM | POA: Diagnosis not present

## 2014-12-02 DIAGNOSIS — E11621 Type 2 diabetes mellitus with foot ulcer: Secondary | ICD-10-CM | POA: Diagnosis not present

## 2014-12-02 LAB — GLUCOSE, CAPILLARY: GLUCOSE-CAPILLARY: 91 mg/dL (ref 65–99)

## 2014-12-03 DIAGNOSIS — M6281 Muscle weakness (generalized): Secondary | ICD-10-CM | POA: Diagnosis not present

## 2014-12-03 DIAGNOSIS — R262 Difficulty in walking, not elsewhere classified: Secondary | ICD-10-CM | POA: Diagnosis not present

## 2014-12-06 DIAGNOSIS — M6281 Muscle weakness (generalized): Secondary | ICD-10-CM | POA: Diagnosis not present

## 2014-12-06 DIAGNOSIS — R262 Difficulty in walking, not elsewhere classified: Secondary | ICD-10-CM | POA: Diagnosis not present

## 2014-12-08 DIAGNOSIS — R262 Difficulty in walking, not elsewhere classified: Secondary | ICD-10-CM | POA: Diagnosis not present

## 2014-12-08 DIAGNOSIS — M6281 Muscle weakness (generalized): Secondary | ICD-10-CM | POA: Diagnosis not present

## 2014-12-09 DIAGNOSIS — E11621 Type 2 diabetes mellitus with foot ulcer: Secondary | ICD-10-CM | POA: Diagnosis not present

## 2014-12-09 DIAGNOSIS — I159 Secondary hypertension, unspecified: Secondary | ICD-10-CM | POA: Diagnosis not present

## 2014-12-09 DIAGNOSIS — L97521 Non-pressure chronic ulcer of other part of left foot limited to breakdown of skin: Secondary | ICD-10-CM | POA: Diagnosis not present

## 2014-12-09 DIAGNOSIS — M7989 Other specified soft tissue disorders: Secondary | ICD-10-CM | POA: Diagnosis not present

## 2014-12-09 DIAGNOSIS — E785 Hyperlipidemia, unspecified: Secondary | ICD-10-CM | POA: Diagnosis not present

## 2014-12-09 DIAGNOSIS — K219 Gastro-esophageal reflux disease without esophagitis: Secondary | ICD-10-CM | POA: Diagnosis not present

## 2014-12-09 DIAGNOSIS — G40909 Epilepsy, unspecified, not intractable, without status epilepticus: Secondary | ICD-10-CM | POA: Diagnosis not present

## 2014-12-09 LAB — GLUCOSE, CAPILLARY: GLUCOSE-CAPILLARY: 95 mg/dL (ref 65–99)

## 2014-12-10 ENCOUNTER — Telehealth: Payer: Self-pay | Admitting: Neurology

## 2014-12-10 DIAGNOSIS — R262 Difficulty in walking, not elsewhere classified: Secondary | ICD-10-CM | POA: Diagnosis not present

## 2014-12-10 DIAGNOSIS — M6281 Muscle weakness (generalized): Secondary | ICD-10-CM | POA: Diagnosis not present

## 2014-12-10 NOTE — Telephone Encounter (Signed)
Heather from [place], cancelled his appointment for today, he went to the wound center and his son with take care of the dressing and they will follow up on Monday/Dawn CB# [phone]

## 2014-12-13 NOTE — Telephone Encounter (Signed)
Noted  

## 2014-12-14 DIAGNOSIS — M6281 Muscle weakness (generalized): Secondary | ICD-10-CM | POA: Diagnosis not present

## 2014-12-14 DIAGNOSIS — R262 Difficulty in walking, not elsewhere classified: Secondary | ICD-10-CM | POA: Diagnosis not present

## 2014-12-16 DIAGNOSIS — I159 Secondary hypertension, unspecified: Secondary | ICD-10-CM | POA: Diagnosis not present

## 2014-12-16 DIAGNOSIS — E785 Hyperlipidemia, unspecified: Secondary | ICD-10-CM | POA: Diagnosis not present

## 2014-12-16 DIAGNOSIS — E11621 Type 2 diabetes mellitus with foot ulcer: Secondary | ICD-10-CM | POA: Diagnosis not present

## 2014-12-16 DIAGNOSIS — G40909 Epilepsy, unspecified, not intractable, without status epilepticus: Secondary | ICD-10-CM | POA: Diagnosis not present

## 2014-12-16 DIAGNOSIS — L97521 Non-pressure chronic ulcer of other part of left foot limited to breakdown of skin: Secondary | ICD-10-CM | POA: Diagnosis not present

## 2014-12-16 DIAGNOSIS — K219 Gastro-esophageal reflux disease without esophagitis: Secondary | ICD-10-CM | POA: Diagnosis not present

## 2014-12-16 DIAGNOSIS — M7989 Other specified soft tissue disorders: Secondary | ICD-10-CM | POA: Diagnosis not present

## 2014-12-17 ENCOUNTER — Encounter: Payer: Self-pay | Admitting: Internal Medicine

## 2014-12-17 ENCOUNTER — Ambulatory Visit (INDEPENDENT_AMBULATORY_CARE_PROVIDER_SITE_OTHER): Payer: Medicare Other | Admitting: Neurology

## 2014-12-17 ENCOUNTER — Encounter: Payer: Self-pay | Admitting: Neurology

## 2014-12-17 VITALS — BP 120/80 | HR 85 | Ht 71.0 in | Wt 186.0 lb

## 2014-12-17 DIAGNOSIS — R569 Unspecified convulsions: Secondary | ICD-10-CM

## 2014-12-17 DIAGNOSIS — F0390 Unspecified dementia without behavioral disturbance: Secondary | ICD-10-CM | POA: Diagnosis not present

## 2014-12-17 DIAGNOSIS — G629 Polyneuropathy, unspecified: Secondary | ICD-10-CM

## 2014-12-17 DIAGNOSIS — R269 Unspecified abnormalities of gait and mobility: Secondary | ICD-10-CM | POA: Diagnosis not present

## 2014-12-17 NOTE — Patient Instructions (Addendum)
1.  Please bring your son with you to your next appointment 2.  You need to use a rollator or walker 3.  Please strongly consider moving into an assisted living facility 4.  Return to clinic in November

## 2014-12-17 NOTE — Progress Notes (Signed)
Follow-up Visit   Date: 12/17/2014   Gerald Leach MRN: [id number] DOB: 1934/05/06   Interim History: Gerald Leach is a 81 y.o. caucasian male with hypertension, hyperlipidemia, GERD, DM, CNS aneurysm s/p coiling ([facility], 2002), and SDH s/p craniotomy (07/3534) complicated by seizure disorder returning to the clinic for follow-up of seizures disorder and dementia.  The patient was accompanied to the clinic by self.  History of present illness: In April 2014, he sustained a fall and became acutely encephalopathic. He was admitted from 4/3 - 09/30/2012 for left SDH and underwent evacuation x 2 and placed on seizure prophylaxis with keppra. He was eventually discharged to rehab facility for TBI. Since he was doing well, in July 2014, trial of weaning Keppra was performed. However, on 8/19 he fell over the front porch chair while climbing the stairs and when his wife went to see him, she noticed his arm was shaking.   Regarding his gait, he reports noticing problems only after Keppra was starting but he also had a SDH at the same time. Looking back at Epic notes, it appears that even in 02/2012 he had a fall with severe facial hematoma and fractured T7 vertebra, so it is likely that symptoms have been longer than what he describes. He had myelogram which showed high-grade stenosis at T7 and was evaluated by Dr. Ronnald Ramp, neurosurgeon. They advised that a watchful waiting or surgery and he elected the conservative option. He feels as if his balance is "off" and very unsteady. He has been walking with a cane since April.  04/21/2013 Follow-up: Labs indicated he 12 deficiency and EMG was consistent with a moderate generalized sensorimotor polyneuropathy. B12 supplementation was started. Of note, on 10/29 he had several grand mal seizures and was taken to the hospital. Keppra increased to 1000mg  BID.  05/18/2014:   Patient was last seen > 1 year ago.  Since then, he has been to the emergency room twice  with break through seizures (last admitted from 8/21-8/25/2015) at which time MRI brain was updated and showed no acute findings (chronic ventricular enlargement, right parietal craniotomy, and right supraclinoid mass (thrombosed supra clinoid ICA aneurysm vs meningioma) and Keppra was increased to 1250mg  BID.  He reports to being compliant with his medications.  He is not driving.  He retired in September 2015.  No seizures since August and he has no new complaints.  Gait remains unsteady and he is walking with a cane.    UPDATE 08/17/2014:  Soon after his last clinic visit, his son called and told us that patient actually had 3 seizures within 3 weeks, which the paitent did not disclose to [place].  Therefore, his Keppra was increased to 1500mg  BID and he reports being compliant with it.  No seizures since his last visit.  No new complaints.  He is walking with a cane and feels that it helps a lot.  No interval falls.  Because his wife has dementia, patient is eating mostly frozen foods.  He does get in arguments frequently with his wife, especially when she asks the same question repeatedly.    UPDATE 11/05/2014:  Gerald Leach was admitted to [facility] with breakthrough seizures, likely in the setting of medication noncompliance as his keppra level on admission was not detected. He reportedly had several seizures at home prior to presentation and another one in the emergency department. Keppra 1500mg  twice daily was restarted and he was loaded with Depakote 1000mg  and started on 500mg  twice  daily.  Repeat MRI brain did not show any new findings, known lobulated giant right ICA terminus aneurysm appears stable encompassing 30 x 18 x 21 mm.   Son reports noticing that patient is having memory problems and getting lost at times.  He was not able to make his PCP's visit because of getting lost and arriving late.  He has accrued late fees on some of his bills.  Regarding his last admission, his son noticed that his  Keppra was in the medicine bottle, but had not been placed in his pillbox, so suspected that he had not been taking it.  He has not set up POA and still manages his own finances.  He is not driving.  Home health now comes to manage his medications.    UPDATE 12/17/2014:  Patient arrived > 1hr late for his appointment.  He looks dishelved and poorly groomed. He complains of stubbing his left toe and having poor wound healing with it.  He is very quite today and does not engage in conversation.  He is looking at retirement homes to move into.  He does not feel that he needs assisted living, despite my disagreement with this.  No interval seizures.    Medications:  Current Outpatient Prescriptions on File Prior to Visit  Medication Sig Dispense Refill  . amoxicillin-clavulanate (AUGMENTIN) 875-125 MG per tablet Take 1 tablet by mouth 2 (two) times daily. 28 tablet 0  . divalproex (DEPAKOTE) 500 MG DR tablet Take 1 tablet (500 mg total) by mouth 2 (two) times daily. 180 tablet 3  . levETIRAcetam (KEPPRA) 750 MG tablet Take 2 tablets (1,500 mg total) by mouth 2 (two) times daily. 360 tablet 3  . levothyroxine (SYNTHROID, LEVOTHROID) 50 MCG tablet Take 1 tablet (50 mcg total) by mouth daily before breakfast. 100 tablet 3  . metFORMIN (GLUCOPHAGE) 500 MG tablet TAKE 1 TABLET BY MOUTH 2 TIMES DAILY WITH A MEAL. (Patient taking differently: Take 500 mg by mouth 2 (two) times daily with a meal. ) 200 tablet 3  . Multiple Vitamin (MULTIVITAMIN WITH MINERALS) TABS tablet Take 1 tablet by mouth 2 (two) times daily.     Marland Kitchen omeprazole (PRILOSEC) 20 MG capsule Take 1 capsule (20 mg total) by mouth every Monday, Wednesday, and Friday. 100 capsule 3  . ONE TOUCH LANCETS MISC 1 each by Does not apply route daily. 200 each 0  . simvastatin (ZOCOR) 10 MG tablet Take 1 tablet (10 mg total) by mouth at bedtime. 100 tablet 3  . sulfamethoxazole-trimethoprim (BACTRIM DS) 800-160 MG per tablet Take 1 tablet by mouth 2 (two)  times daily. 28 tablet 0   No current facility-administered medications on file prior to visit.    Allergies: No Known Allergies   Review of Systems:  CONSTITUTIONAL: No fevers, chills, night sweats, or weight loss.   EYES: No visual changes or eye pain ENT: No hearing changes.  No history of nose bleeds.   RESPIRATORY: No cough, wheezing and shortness of breath.   CARDIOVASCULAR: Negative for chest pain, and palpitations.   GI: Negative for abdominal discomfort, blood in stools or black stools.  No recent change in bowel habits.   GU:  No history of incontinence.   MUSCLOSKELETAL: No history of joint pain or swelling.  No myalgias.   SKIN: + lesions, rash, and itching.   ENDOCRINE: Negative for cold or heat intolerance, polydipsia or goiter.   PSYCH:  +depression or anxiety symptoms.   NEURO: As Above.  Vital Signs:  BP 120/80 mmHg  Pulse 85  Ht 5\' 11"  (1.803 m)  Wt 186 lb (84.369 kg)  BMI 25.95 kg/m2  SpO2 96%   Neurological Exam: MENTAL STATUS:  Unshaven, dressed appropriately.  He is slow to process one-step commands and has to be asked repeatedly at times.  He is oriented to person, place, year, and month.  CRANIAL NERVES: Pupils equal round and reactive to light.  Restricted upgaze bilaterally, otherwise extra-ocular eye movements intact. Normal facial sensation.  Mild left facial droop.   MOTOR:  Motor strength is 5/5 throughout except 5-/5 instrinsic hand muscles and left hip flexion. Tone is normal.    COORDINATION/GAIT:    Gait appears stooped, slow, and ataxic.  He uses a single prong cane.  Data: MRI brain wo contrast 10/03/2014: 1. Stable.  No acute intracranial abnormality. 2. Sequelae of right ICA occlusion with stable thrombosed appearing giant right ICA terminus aneurysm up to 3 cm. 3. 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 Sequelae of right frontal craniotomy. 4. Cerebral volume loss with suspected ex vacuo ventricle enlargement and  nonspecific chronic white matter signal changes.  EMG 04/13/2013: 1. Generalized large fiber sensorimotor polyneuropathy affecting the left side; moderate in degree electrically. A mild superimposed L5-S1 radiculopathy cannot be excluded. 2. Left median neuropathy at or distal to the wrist consistent with the clinical diagnosis of carpal tunnel syndrome, overall these changes are mild-moderate in degree electrically. 3. Mild left ulnar neuropathy at the elbow with purely demyelinating features evidenced as conduction velocity slowing.   Lab Results  Component Value Date   TSH 5.026* 10/02/2014   Lab Results  Component Value Date   HGBA1C 6.4* 10/02/2014   Lab Results  Component Value Date   [id number] 638 11/05/2014   PROBLEM LIST: 1.  Seizure disorder 2.  Multifactorial dementia 3.  Gait disorder 4.  History of SDH in 09/2012 s/p evacuation 5.  Right clinoid calcified meningioma vs thrombosed giant ICA aneurysm 6. High grade T7 central canal stenosis with T7 vertebral fracture s/p fall (02/2012).  Previously seen Dr. Ronnald Ramp in Neurosurgery, patient elected conservative management    IMPRESSION/PLAN: 1. Seizure disorder, history of subdural hematoma 09/2012, no interval seizures - Started for seizure prophylaxis and subsequent breakthrough seizures (04/15/2013, 02/05/2014, 06/02/2015, 09/2014)  - Continue Keppra 1500mg  BID and depakote 500mg  twice daily  - Seizure precautions including no driving was discussed  2.  Cognitive impairment, likely due to multifactorial dementia [facility] 19/30), severe and interfering with IADLs and ADLs  - Patient is very reluctant to move into assisted living facility which we discussed at length  - Home health is managing medications  - Patient declined aricept   3. Multifactorial gait disorder s/p falls, secondary to large fiber polyneuropathy and lumbosacral radiculopathy   - Recommended using rollator   4. Vitamin B12 deficiency.   Continue B12 1028mcg daily   Return to clinic in 38-months   The duration of this appointment visit was 25 minutes of face-to-face time with the patient.  Greater than 50% of this time was spent in counseling, explanation of diagnosis, planning of further management, and coordination of care.   Thank you for allowing me to participate in patient's care.  If I can answer any additional questions, I would be pleased to do so.    Sincerely,    Lazaro Isenhower K. Posey Pronto, DO

## 2015-02-18 ENCOUNTER — Ambulatory Visit: Payer: Medicare Other | Admitting: Neurology

## 2015-03-07 ENCOUNTER — Encounter: Payer: Self-pay | Admitting: Neurology

## 2015-03-07 ENCOUNTER — Ambulatory Visit (INDEPENDENT_AMBULATORY_CARE_PROVIDER_SITE_OTHER): Payer: Medicare Other | Admitting: Neurology

## 2015-03-07 ENCOUNTER — Telehealth: Payer: Self-pay | Admitting: Neurology

## 2015-03-07 VITALS — BP 110/78 | HR 66 | Ht 71.0 in | Wt 177.1 lb

## 2015-03-07 DIAGNOSIS — G40909 Epilepsy, unspecified, not intractable, without status epilepticus: Secondary | ICD-10-CM

## 2015-03-07 DIAGNOSIS — F0390 Unspecified dementia without behavioral disturbance: Secondary | ICD-10-CM | POA: Diagnosis not present

## 2015-03-07 DIAGNOSIS — G609 Hereditary and idiopathic neuropathy, unspecified: Secondary | ICD-10-CM | POA: Diagnosis not present

## 2015-03-07 DIAGNOSIS — G629 Polyneuropathy, unspecified: Secondary | ICD-10-CM | POA: Diagnosis not present

## 2015-03-07 DIAGNOSIS — R269 Unspecified abnormalities of gait and mobility: Secondary | ICD-10-CM | POA: Diagnosis not present

## 2015-03-07 DIAGNOSIS — M21372 Foot drop, left foot: Secondary | ICD-10-CM | POA: Diagnosis not present

## 2015-03-07 LAB — COMPREHENSIVE METABOLIC PANEL
ALBUMIN: 4 g/dL (ref 3.6–5.1)
ALK PHOS: 57 U/L (ref 40–115)
ALT: 13 U/L (ref 9–46)
AST: 14 U/L (ref 10–35)
BUN: 12 mg/dL (ref 7–25)
CALCIUM: 9 mg/dL (ref 8.6–10.3)
CO2: 29 mmol/L (ref 20–31)
Chloride: 98 mmol/L (ref 98–110)
Creat: 0.7 mg/dL (ref 0.70–1.18)
GLUCOSE: 77 mg/dL (ref 65–99)
POTASSIUM: 4.3 mmol/L (ref 3.5–5.3)
Sodium: 137 mmol/L (ref 135–146)
Total Bilirubin: 0.6 mg/dL (ref 0.2–1.2)
Total Protein: 6.7 g/dL (ref 6.1–8.1)

## 2015-03-07 LAB — TSH: TSH: 7.046 u[IU]/mL — ABNORMAL HIGH (ref 0.350–4.500)

## 2015-03-07 NOTE — Telephone Encounter (Signed)
Pt/son/[name]/ needs an approval for a" round about walker"//[phone]

## 2015-03-07 NOTE — Telephone Encounter (Signed)
Pt's son Gerald Leach called and has a question about one of the medications that Dr Posey Pronto prescribed for him today/Dawn CB# [phone]

## 2015-03-07 NOTE — Telephone Encounter (Signed)
Left message on machine for patient's son to call back.   

## 2015-03-07 NOTE — Patient Instructions (Addendum)
1.  Check blood work 2.  Reduce valproate to 250mg  in the morning 500mg  at bedtime 3.  Continue Keppra 1500mg  twice daily 4.  MRI brain without contrast 5.  Call with update in 1 month 6.  Start using a rollator  Return to clinic in 2-3 months

## 2015-03-07 NOTE — Progress Notes (Signed)
Follow-up Visit   Date: 03/07/2015   Gerald Leach MRN: [id number] DOB: [birth year]   Interim History: Gerald Leach is a 81 y.o. caucasian male with hypertension, hyperlipidemia, GERD, DM, CNS aneurysm s/p coiling ([facility], 2002), and SDH s/p craniotomy (0/7371) complicated by seizure disorder returning to the clinic for follow-up of seizures disorder and dementia.  The patient was accompanied to the clinic by daughter and son.  History of present illness: In April 2014, he sustained a fall and became acutely encephalopathic. He was admitted from 4/3 - 09/30/2012 for left SDH and underwent evacuation x 2 and placed on seizure prophylaxis with keppra. He was eventually discharged to rehab facility for TBI. Since he was doing well, in July 2014, trial of weaning Keppra was performed. However, on 8/19 he fell over the front porch chair while climbing the stairs and when his wife went to see him, she noticed his arm was shaking.   Regarding his gait, he reports noticing problems only after Keppra was starting but he also had a SDH at the same time. Looking back at Epic notes, it appears that even in 02/2012 he had a fall with severe facial hematoma and fractured T7 vertebra, so it is likely that symptoms have been longer than what he describes. He had myelogram which showed high-grade stenosis at T7 and was evaluated by Dr. Ronnald Ramp, neurosurgeon. They advised that a watchful waiting or surgery and he elected the conservative option. He feels as if his balance is "off" and very unsteady. He has been walking with a cane since April.  04/21/2013 Follow-up: Labs indicated he 12 deficiency and EMG was consistent with a moderate generalized sensorimotor polyneuropathy. B12 supplementation was started. Of note, on 10/29 he had a grand mal seizures and was taken to the hospital. Keppra increased to 1000mg  BID.  05/18/2014:   Patient was last seen > 1 year ago.  Since then, he has been to the emergency room  twice with break through seizures (last admitted from 8/21-8/25/2015) at which time MRI brain was updated and showed no acute findings (chronic ventricular enlargement, right parietal craniotomy, and right supraclinoid mass (thrombosed supra clinoid ICA aneurysm vs meningioma) and Keppra was increased to 1250mg  BID.  He reports to being compliant with his medications.  He is not driving.  He retired in September 2015.  No seizures since August and he has no new complaints.  Gait remains unsteady and he is walking with a cane.    UPDATE 08/17/2014:  Soon after his last clinic visit, his son called and told us that patient actually had 3 seizures within 3 weeks, which the paitent did not disclose to [place].  Therefore, his Keppra was increased to 1500mg  BID and he reports being compliant with it.  No seizures since his last visit.   He is walking with a cane and feels that it helps a lot.  No interval falls.  Because his wife has dementia, patient is eating mostly frozen foods.    UPDATE 11/05/2014:  Gerald Leach was admitted to [facility] with breakthrough seizures, likely in the setting of medication noncompliance as his keppra level on admission was not detected. He reportedly had several seizures at home prior to presentation and another one in the emergency department. Keppra 1500mg  twice daily was restarted and he was loaded with Depakote 1000mg  and started on 500mg  twice daily.  Repeat MRI brain did not show any new findings, known lobulated giant right ICA terminus aneurysm appears  stable encompassing 30 x 18 x 21 mm.   Son reports noticing that patient is having memory problems and getting lost at times.  He was not able to make his PCP's visit because of getting lost and arriving late.  He has accrued late fees on some of his bills.  Regarding his last admission, his son noticed that his Keppra was in the medicine bottle, but had not been placed in his pillbox, so suspected that he had not been taking it.  He  has not set up POA and still manages his own finances.  He is not driving.  Home health now comes to manage his medications.    UPDATE 12/17/2014:  Patient arrived > 1hr late for his appointment.  He looks dishelved and poorly groomed. He complains of stubbing his left toe and having poor wound healing with it.  He is very quite today and does not engage in conversation.  He is looking at retirement homes to move into.  He does not feel that he needs assisted living, despite my disagreement with this.  No interval seizures.   UPDATE 03/07/2015:  Son and daughter feel that over the past 3 months, his walking is much worse and he is sleeping a lot more than usual.  He sleeps around 7:30pm and wakes up at 10am. Son's girlfriend comes as an aid to help manage medications and cleaning.  He started using a walker and has not had any falls, but his walking is much worse than before.  Son says it takes him 15 minutes to walk 70 feet and is much more sedentary.  He refused to allow PT help, so it was discontinued.  No interval seizures.    Medications:  Current Outpatient Prescriptions on File Prior to Visit  Medication Sig Dispense Refill  . divalproex (DEPAKOTE) 500 MG DR tablet Take 1 tablet (500 mg total) by mouth 2 (two) times daily. 180 tablet 3  . levETIRAcetam (KEPPRA) 750 MG tablet Take 2 tablets (1,500 mg total) by mouth 2 (two) times daily. 360 tablet 3  . levothyroxine (SYNTHROID, LEVOTHROID) 50 MCG tablet Take 1 tablet (50 mcg total) by mouth daily before breakfast. 100 tablet 3  . metFORMIN (GLUCOPHAGE) 500 MG tablet TAKE 1 TABLET BY MOUTH 2 TIMES DAILY WITH A MEAL. (Patient taking differently: Take 500 mg by mouth 2 (two) times daily with a meal. ) 200 tablet 3  . Multiple Vitamin (MULTIVITAMIN WITH MINERALS) TABS tablet Take 1 tablet by mouth 2 (two) times daily.     Marland Kitchen omeprazole (PRILOSEC) 20 MG capsule Take 1 capsule (20 mg total) by mouth every Monday, Wednesday, and Friday. 100 capsule 3  .  ONE TOUCH LANCETS MISC 1 each by Does not apply route daily. 200 each 0  . simvastatin (ZOCOR) 10 MG tablet Take 1 tablet (10 mg total) by mouth at bedtime. 100 tablet 3   No current facility-administered medications on file prior to visit.    Allergies: No Known Allergies   Review of Systems:  CONSTITUTIONAL: No fevers, chills, night sweats, or weight loss.   EYES: No visual changes or eye pain ENT: No hearing changes.  No history of nose bleeds.   RESPIRATORY: No cough, wheezing and shortness of breath.   CARDIOVASCULAR: Negative for chest pain, and palpitations.   GI: Negative for abdominal discomfort, blood in stools or Sandeep Delagarza stools.  No recent change in bowel habits.   GU:  No history of incontinence.   MUSCLOSKELETAL: No history  of joint pain or swelling.  No myalgias.   SKIN: + lesions, rash, and itching.   ENDOCRINE: Negative for cold or heat intolerance, polydipsia or goiter.   PSYCH:  +depression or anxiety symptoms.   NEURO: As Above.   Vital Signs:  BP 110/78 mmHg  Pulse 66  Ht 5\' 11"  (1.803 m)  Wt 177 lb 2 oz (80.343 kg)  BMI 24.71 kg/m2  SpO2 98%   Neurological Exam: MENTAL STATUS:  Unshaven, dressed appropriately.  He is slow to process one-step commands and has to be asked repeatedly at times.  He is oriented to person, place, year, and month.  CRANIAL NERVES: Pupils equal round and reactive to light.  Restricted upgaze bilaterally, otherwise extra-ocular eye movements intact. Normal facial sensation.  Mild left facial droop.   MOTOR:  Left proximal and distal leg with 4+/5 motor strength - new.  Bilateral UE and RLE is 5/5 throughout except 5-/5 instrinsic hand muscles and left hip flexion. Tone is normal.    COORDINATION/GAIT:  Very unsteady and ataxic gait, even with using a walker.   Data: MRI brain wo contrast 10/03/2014: 1. Stable.  No acute intracranial abnormality. 2. Sequelae of right ICA occlusion with stable thrombosed appearing giant right ICA  terminus aneurysm up to 3 cm. 3. 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 Sequelae of right frontal craniotomy. 4. Cerebral volume loss with suspected ex vacuo ventricle enlargement and nonspecific chronic white matter signal changes.  EMG 04/13/2013: 1. Generalized large fiber sensorimotor polyneuropathy affecting the left side; moderate in degree electrically. A mild superimposed L5-S1 radiculopathy cannot be excluded. 2. Left median neuropathy at or distal to the wrist consistent with the clinical diagnosis of carpal tunnel syndrome, overall these changes are mild-moderate in degree electrically. 3. Mild left ulnar neuropathy at the elbow with purely demyelinating features evidenced as conduction velocity slowing.   Lab Results  Component Value Date   TSH 5.026* 10/02/2014   Lab Results  Component Value Date   HGBA1C 6.4* 10/02/2014   Lab Results  Component Value Date   [id number] 809 11/05/2014   PROBLEM LIST: 1.  Seizure disorder 2.  Multifactorial dementia 3.  Gait disorder 4.  History of SDH in 09/2012 s/p evacuation 5.  Right clinoid calcified meningioma vs thrombosed giant ICA aneurysm 6. High grade T7 central canal stenosis with T7 vertebral fracture s/p fall (02/2012).  Previously seen Dr. Ronnald Ramp in Neurosurgery, patient elected conservative management    IMPRESSION/PLAN: 1. Left leg weakness - new   - New stroke vs worsening lumbar radiculopathy  - MRI brain wo constast to look for evidence of stroke, if no stroke check MRI lumbar spine  - Counseled on fall precautions at length especially as he is dragging the left foot  - Encouraged rollator  - He would benefit from home PT, but he is not interested   2.  Seizure disorder, history of subdural hematoma 09/2012, no interval seizures - Started for seizure prophylaxis and subsequent breakthrough seizures (04/15/2013, 02/05/2014, 06/02/2015, 09/2014)  - Continue Keppra 1500mg  BID.    - Reduce  depakote to 250mg  in the morning and 500mg  at bedtime to see if this helps sedation.  May taper further if he remains seizure free  - I am not convinced that he needs a second antiepileptic agent as his most recent seizure occurred in the setting of medication noncomplaince  3.  Multifactorial dementia, severe and interfering with IADLs and ADLs  - Patient is very reluctant to move  into assisted living facility which we discussed at length  - Home health is managing medications   4. Multifactorial gait disorder s/p falls, secondary to large fiber polyneuropathy and lumbosacral radiculopathy, new left foot drop  - Recommended using rollator   5. Vitamin B12 deficiency.  Continue B12 1068mcg daily  6.  Increased sedation  - Check CMP, TSH  - Slowly reduce VPA as noted above   Return to clinic in 2-3 months   The duration of this appointment visit was 40 minutes of face-to-face time with the patient.  Greater than 50% of this time was spent in counseling, explanation of diagnosis, planning of further management, and coordination of care.   Thank you for allowing me to participate in patient's care.  If I can answer any additional questions, I would be pleased to do so.    Sincerely,    Donika K. Posey Pronto, DO

## 2015-03-08 ENCOUNTER — Other Ambulatory Visit: Payer: Self-pay | Admitting: Family Medicine

## 2015-03-08 ENCOUNTER — Other Ambulatory Visit: Payer: Self-pay | Admitting: *Deleted

## 2015-03-08 DIAGNOSIS — R569 Unspecified convulsions: Secondary | ICD-10-CM

## 2015-03-08 DIAGNOSIS — G40909 Epilepsy, unspecified, not intractable, without status epilepticus: Secondary | ICD-10-CM

## 2015-03-08 DIAGNOSIS — R269 Unspecified abnormalities of gait and mobility: Secondary | ICD-10-CM

## 2015-03-08 DIAGNOSIS — W19XXXA Unspecified fall, initial encounter: Secondary | ICD-10-CM

## 2015-03-08 DIAGNOSIS — F0390 Unspecified dementia without behavioral disturbance: Secondary | ICD-10-CM

## 2015-03-08 DIAGNOSIS — E039 Hypothyroidism, unspecified: Secondary | ICD-10-CM

## 2015-03-08 DIAGNOSIS — M21372 Foot drop, left foot: Secondary | ICD-10-CM

## 2015-03-08 NOTE — Telephone Encounter (Signed)
[name] called back requesting Rx for rollator.  Rx faxed.

## 2015-03-08 NOTE — Telephone Encounter (Signed)
Called [name] back and left message for him to call me back.

## 2015-03-17 DIAGNOSIS — R269 Unspecified abnormalities of gait and mobility: Secondary | ICD-10-CM | POA: Diagnosis not present

## 2015-03-23 ENCOUNTER — Ambulatory Visit
Admission: RE | Admit: 2015-03-23 | Discharge: 2015-03-23 | Disposition: A | Discharge status: Home / Self Care | Payer: Medicare Other | Source: Ambulatory Visit | Attending: Neurology | Admitting: Neurology

## 2015-03-23 DIAGNOSIS — F0390 Unspecified dementia without behavioral disturbance: Secondary | ICD-10-CM | POA: Diagnosis not present

## 2015-03-23 DIAGNOSIS — R531 Weakness: Secondary | ICD-10-CM | POA: Diagnosis not present

## 2015-03-28 ENCOUNTER — Other Ambulatory Visit: Payer: Self-pay | Admitting: Neurology

## 2015-03-28 ENCOUNTER — Telehealth: Payer: Self-pay | Admitting: Neurology

## 2015-03-28 NOTE — Telephone Encounter (Signed)
Called and discussed MRI brain results with patient and his son.  There is chronically occluded RICA, giant thromboised R ICA aneurysm, and ventriculomegaly, possibly due to NPH. No evidence of stroke of left leg weakness.  I recommend that look at his lumbar spine to look for a cause of his left leg weakness as there is no evidence of stroke.   Son states that he is experiencing incontinence and worsening memory problems over the past few months, so will need to keep in mind possibility of NPH.  May consider LP for large volume tap going forward.  Cole Eastridge K. Posey Pronto, DO

## 2015-03-28 NOTE — Telephone Encounter (Signed)
Pt's daughter Shirlean Mylar called and was asking about his MRI/Dawn CB# [phone]

## 2015-03-28 NOTE — Telephone Encounter (Signed)
I spoke with Shirlean Mylar and informed her that I did not call patient on Friday about his MRI.  She said that she would call the ordering physician's office.

## 2015-03-29 ENCOUNTER — Other Ambulatory Visit: Payer: Self-pay | Admitting: *Deleted

## 2015-03-29 DIAGNOSIS — G629 Polyneuropathy, unspecified: Secondary | ICD-10-CM

## 2015-03-29 DIAGNOSIS — R269 Unspecified abnormalities of gait and mobility: Secondary | ICD-10-CM

## 2015-03-29 DIAGNOSIS — M5417 Radiculopathy, lumbosacral region: Secondary | ICD-10-CM

## 2015-03-29 DIAGNOSIS — M21372 Foot drop, left foot: Secondary | ICD-10-CM

## 2015-03-29 NOTE — Telephone Encounter (Signed)
MRI ordered and [facility] will call Gerald Leach (son) to set it up.

## 2015-04-08 ENCOUNTER — Ambulatory Visit
Admission: RE | Admit: 2015-04-08 | Discharge: 2015-04-08 | Disposition: A | Discharge status: Home / Self Care | Payer: Medicare Other | Source: Ambulatory Visit | Attending: Neurology | Admitting: Neurology

## 2015-04-08 ENCOUNTER — Telehealth: Payer: Self-pay | Admitting: *Deleted

## 2015-04-08 DIAGNOSIS — M4806 Spinal stenosis, lumbar region: Secondary | ICD-10-CM | POA: Diagnosis not present

## 2015-04-08 DIAGNOSIS — M5417 Radiculopathy, lumbosacral region: Secondary | ICD-10-CM

## 2015-04-08 DIAGNOSIS — R269 Unspecified abnormalities of gait and mobility: Secondary | ICD-10-CM

## 2015-04-08 DIAGNOSIS — G629 Polyneuropathy, unspecified: Secondary | ICD-10-CM

## 2015-04-08 DIAGNOSIS — M21372 Foot drop, left foot: Secondary | ICD-10-CM

## 2015-04-08 NOTE — Telephone Encounter (Signed)
[name] called and said that depakote was lowered to 250 qam and 500 qpm.  He says he is doing well.  Will cut back to 250 qam and 250 qpm per [name].

## 2015-04-11 ENCOUNTER — Telehealth: Payer: Self-pay | Admitting: Neurology

## 2015-04-11 NOTE — Telephone Encounter (Signed)
Gerald Leach returned my call.  Discussed that there is mild age-related changes of the lumbar spine, but nothing to explain is leg weakness, gait abnormalities, or incontinence.  It is reasonable to investigate for NPH given his MRI brian findings and the new symptoms of urinary incontinence and memory changes.  Son will discuss with Gerald Leach and call back to confirm testing.  Donika K. Posey Pronto, DO

## 2015-04-11 NOTE — Telephone Encounter (Signed)
I attempted to contact patient's son, [name], via phone today regarding the results of MRI lumbar spine, however there was no answer so a message was left for the patient to return my call.   Donika K. Posey Pronto, DO

## 2015-04-12 ENCOUNTER — Telehealth: Payer: Self-pay | Admitting: Neurology

## 2015-04-12 NOTE — Telephone Encounter (Signed)
Pt/ son Gerald Leach/ called to inform that they want the spinal tap scheduled/Gerald Leach Tabet, son, [phone]

## 2015-04-12 NOTE — Telephone Encounter (Signed)
Noted.  Will order.

## 2015-04-13 ENCOUNTER — Telehealth: Payer: Self-pay | Admitting: Neurology

## 2015-04-13 ENCOUNTER — Other Ambulatory Visit: Payer: Self-pay | Admitting: *Deleted

## 2015-04-13 DIAGNOSIS — M5417 Radiculopathy, lumbosacral region: Secondary | ICD-10-CM

## 2015-04-13 DIAGNOSIS — M21372 Foot drop, left foot: Secondary | ICD-10-CM

## 2015-04-13 DIAGNOSIS — W19XXXA Unspecified fall, initial encounter: Secondary | ICD-10-CM

## 2015-04-13 DIAGNOSIS — R269 Unspecified abnormalities of gait and mobility: Secondary | ICD-10-CM

## 2015-04-13 DIAGNOSIS — R569 Unspecified convulsions: Secondary | ICD-10-CM

## 2015-04-13 DIAGNOSIS — G629 Polyneuropathy, unspecified: Secondary | ICD-10-CM

## 2015-04-13 NOTE — Telephone Encounter (Signed)
Patient is scheduled for Tuesday, November 1 at 10:00.  [name] is aware and will have the patient there at the 9:00 arrival time.  Instructions given to be NPO 6 hours prior.

## 2015-04-13 NOTE — Telephone Encounter (Signed)
Left message for Gerald Leach to call me back.

## 2015-04-13 NOTE — Telephone Encounter (Signed)
Aleisa/ [phone] [facility] Scheduling/ called concerning the Lumbar Puncture for Nov. 1, 2016

## 2015-04-14 ENCOUNTER — Other Ambulatory Visit: Payer: Self-pay | Admitting: Radiology

## 2015-04-14 NOTE — Telephone Encounter (Signed)
I spoke with Gerald Leach and informed her that the LP has been set up and [name] with PT will coordinate with radiology.

## 2015-04-14 NOTE — Telephone Encounter (Signed)
Gerald Leach called and left another message for me to call her back.  Called back and left message for her to call me back.

## 2015-04-18 ENCOUNTER — Other Ambulatory Visit (INDEPENDENT_AMBULATORY_CARE_PROVIDER_SITE_OTHER): Payer: Medicare Other

## 2015-04-18 ENCOUNTER — Ambulatory Visit (INDEPENDENT_AMBULATORY_CARE_PROVIDER_SITE_OTHER): Payer: Medicare Other | Admitting: *Deleted

## 2015-04-18 DIAGNOSIS — E039 Hypothyroidism, unspecified: Secondary | ICD-10-CM | POA: Diagnosis not present

## 2015-04-18 DIAGNOSIS — Z23 Encounter for immunization: Secondary | ICD-10-CM

## 2015-04-19 ENCOUNTER — Ambulatory Visit (HOSPITAL_COMMUNITY)
Admission: RE | Admit: 2015-04-19 | Discharge: 2015-04-19 | Disposition: A | Discharge status: Home / Self Care | Payer: Medicare Other | Source: Ambulatory Visit | Attending: Neurology | Admitting: Neurology

## 2015-04-19 ENCOUNTER — Other Ambulatory Visit: Payer: Self-pay | Admitting: Family Medicine

## 2015-04-19 DIAGNOSIS — M5417 Radiculopathy, lumbosacral region: Secondary | ICD-10-CM | POA: Insufficient documentation

## 2015-04-19 DIAGNOSIS — R269 Unspecified abnormalities of gait and mobility: Secondary | ICD-10-CM | POA: Insufficient documentation

## 2015-04-19 DIAGNOSIS — W19XXXA Unspecified fall, initial encounter: Secondary | ICD-10-CM

## 2015-04-19 DIAGNOSIS — G629 Polyneuropathy, unspecified: Secondary | ICD-10-CM | POA: Diagnosis not present

## 2015-04-19 DIAGNOSIS — R569 Unspecified convulsions: Secondary | ICD-10-CM | POA: Diagnosis not present

## 2015-04-19 DIAGNOSIS — M21372 Foot drop, left foot: Secondary | ICD-10-CM | POA: Diagnosis not present

## 2015-04-19 LAB — CSF CELL COUNT WITH DIFFERENTIAL
EOS CSF: NONE SEEN % (ref 0–1)
RBC Count, CSF: 32 /mm3 — ABNORMAL HIGH
Segmented Neutrophils-CSF: NONE SEEN % (ref 0–6)
TUBE #: 3
WBC, CSF: 0 /mm3 (ref 0–5)

## 2015-04-19 LAB — GLUCOSE, CSF: Glucose, CSF: 60 mg/dL (ref 40–70)

## 2015-04-19 LAB — TSH: TSH: 7.67 u[IU]/mL — ABNORMAL HIGH (ref 0.35–4.50)

## 2015-04-19 LAB — PROTEIN, CSF: TOTAL PROTEIN, CSF: 46 mg/dL — AB (ref 15–45)

## 2015-04-19 NOTE — Discharge Instructions (Signed)
Lumbar Puncture, Care After °Refer to this sheet in the next few weeks. These instructions provide you with information on caring for yourself after your procedure. Your health care provider may also give you more specific instructions. Your treatment has been planned according to current medical practices, but problems sometimes occur. Call your health care provider if you have any problems or questions after your procedure. °WHAT TO EXPECT AFTER THE PROCEDURE °After your procedure, it is typical to have the following sensations: °· Mild discomfort or pain at the insertion site. °· Mild headache that is relieved with pain medicines. °HOME CARE INSTRUCTIONS °· Avoid lifting anything heavier than 10 lb (4.5 kg) for at least 12 hours after the procedure. °· Drink enough fluids to keep your urine clear or pale yellow. °SEEK MEDICAL CARE IF: °· You have fever or chills. °· You have nausea or vomiting. °· You have a headache that lasts for more than 2 days. °SEEK IMMEDIATE MEDICAL CARE IF: °· You have any numbness or tingling in your legs. °· You are unable to control your bowel or bladder. °· You have bleeding or swelling in your back at the insertion site. °· You are dizzy or faint. °  °This information is not intended to replace advice given to you by your health care provider. Make sure you discuss any questions you have with your health care provider. °  °Document Released: 06/09/2013 Document Reviewed: 06/09/2013 °Elsevier Interactive Patient Education ©2016 Elsevier Inc. ° °

## 2015-04-20 ENCOUNTER — Telehealth: Payer: Self-pay | Admitting: Neurology

## 2015-04-20 NOTE — Telephone Encounter (Signed)
Called to find out if Mr. Lightner noticed any changes in his gait or incontinence since undergoing lumbar puncture, but there was no answer.  I called his son, [name], for an update, but he did not feel that there was a marked change, but will see his father again this evening and call us with an update tomorrow.  PT evaluation noticed mild improvement in gait speed and timed-up-and-go, balance remained unchanged.  Unless there has been a marked change in his gait and incontinence as per patient's report, this is unlikely NPH and symptomatic management for gait and incontinence is recommended.  Donika K. Posey Pronto, DO

## 2015-04-21 ENCOUNTER — Encounter: Payer: Self-pay | Admitting: *Deleted

## 2015-04-21 ENCOUNTER — Ambulatory Visit: Payer: Medicare Other | Admitting: Neurology

## 2015-04-22 ENCOUNTER — Other Ambulatory Visit: Payer: Self-pay | Admitting: Family Medicine

## 2015-04-22 DIAGNOSIS — R7989 Other specified abnormal findings of blood chemistry: Secondary | ICD-10-CM

## 2015-04-22 LAB — CSF CULTURE

## 2015-04-22 LAB — CSF CULTURE W GRAM STAIN: Culture: NO GROWTH

## 2015-04-22 MED ORDER — LEVOTHYROXINE SODIUM 75 MCG PO TABS: 75.0000 ug | ORAL_TABLET | Freq: Every day | ORAL | Status: DC

## 2015-04-27 NOTE — Telephone Encounter (Signed)
Have you spoken to [place].  If not, I will call him to see how his dad is doing.

## 2015-04-28 NOTE — Telephone Encounter (Signed)
No, I haven't spoken to him.  Can you call see how his father his doing?  Donika K. Posey Pronto, DO

## 2015-05-03 ENCOUNTER — Telehealth: Payer: Self-pay | Admitting: *Deleted

## 2015-05-03 NOTE — Telephone Encounter (Signed)
Called [name] and left a message for him to call me if he has any updates for [name] about his dad.  Otherwise, he can update at the next visit.

## 2015-05-04 ENCOUNTER — Other Ambulatory Visit: Payer: Self-pay | Admitting: Family Medicine

## 2015-05-06 ENCOUNTER — Telehealth: Payer: Self-pay

## 2015-05-06 NOTE — Telephone Encounter (Signed)
Pt due for an appointment.  Called to schedule an appt with Dr. [name].  Left a message for call back.

## 2015-05-09 ENCOUNTER — Telehealth: Payer: Self-pay | Admitting: Family Medicine

## 2015-05-09 NOTE — Telephone Encounter (Signed)
Spoke with wife and per Dr [name] patient should be fine.  Wife will observe patient and call back if any changes occur.

## 2015-05-09 NOTE — Telephone Encounter (Signed)
Patient Name: Gerald Leach  DOB: 07-29-1933    Initial Comment Caller states patient took double meds.Call lost from office.   Nurse Assessment  Nurse: Raphael Gibney, RN, Vera Date/Time Eilene Ghazi Time): 05/09/2015 1:22:18 PM  Confirm and document reason for call. If symptomatic, describe symptoms. ---Caller states she fills his box every morning. He had taken all his am monday medications and she filled his pill box for Tuesday and he took all the medications again about 2 hrs. He took 1 whole pill of Diavox for seizures; took 4 tabs of levetiracetam; He took 2 doses of levothyroid; he took 2 metformin 500 mg; 1 omeprazole. Says he feels fine. He is eating lunch. He is supposed to take his seizure medication again tonight at 8 pm.  Has the patient traveled out of the country within the last 30 days? ---Not Applicable  Does the patient have any new or worsening symptoms? ---Yes  Will a triage be completed? ---Yes  Related visit to physician within the last 2 weeks? ---No  Does the PT have any chronic conditions? (i.e. diabetes, asthma, etc.) ---Yes  List chronic conditions. ---thyroid; seizures; diabetes  Is this a behavioral health call? ---No     Guidelines    Guideline Title Affirmed Question Affirmed Notes  Poisoning [1] DOUBLE DOSE (an extra dose or lesser amount) of prescription drug AND [2] NO symptoms    Final Disposition User   Call PCP Now Raphael Gibney, RN, Vera    Comments  Called back line and spoke to [place] and advised her that pt has taken double dose of all his prescription medication with triage outcome of call PCP now which means to see doctor in 4 hrs and Dr. [name] has no available appts. States that they are waiting to hear back from the doctor and someone will call the pt back. [name] notified and verbalized understanding.   Referrals  REFERRED TO PCP OFFICE   Disagree/Comply: Comply

## 2015-06-03 ENCOUNTER — Telehealth: Payer: Self-pay | Admitting: *Deleted

## 2015-06-03 NOTE — Telephone Encounter (Signed)
Patient's son said that he is taking divalproex 250 mg bid.

## 2015-06-03 NOTE — Telephone Encounter (Signed)
Attempted to reach, no vm set up 

## 2015-06-03 NOTE — Telephone Encounter (Signed)
The note about decreasing the divalproex was sent under the wrong patient's chart.  This is the correct patient.  His son called about decreasing the medication.  Please advise.

## 2015-06-03 NOTE — Telephone Encounter (Signed)
See next note

## 2015-06-03 NOTE — Telephone Encounter (Signed)
What is the current dose?

## 2015-06-03 NOTE — Telephone Encounter (Signed)
If he has not had further seizures, he can decrease to 250mg  at bedtime

## 2015-06-03 NOTE — Telephone Encounter (Signed)
Called [name] and gave him instructions per Dr. Tomi Likens.

## 2015-06-04 ENCOUNTER — Other Ambulatory Visit: Payer: Self-pay | Admitting: Family Medicine

## 2015-06-26 ENCOUNTER — Other Ambulatory Visit: Payer: Self-pay | Admitting: Family Medicine

## 2015-07-20 ENCOUNTER — Ambulatory Visit: Payer: Medicare Other | Admitting: Neurology

## 2015-07-20 ENCOUNTER — Encounter: Payer: Self-pay | Admitting: Neurology

## 2015-07-20 ENCOUNTER — Ambulatory Visit (INDEPENDENT_AMBULATORY_CARE_PROVIDER_SITE_OTHER): Payer: Medicare Other | Admitting: Neurology

## 2015-07-20 VITALS — BP 120/84 | HR 65 | Wt 186.3 lb

## 2015-07-20 DIAGNOSIS — G40909 Epilepsy, unspecified, not intractable, without status epilepticus: Secondary | ICD-10-CM | POA: Diagnosis not present

## 2015-07-20 DIAGNOSIS — F0390 Unspecified dementia without behavioral disturbance: Secondary | ICD-10-CM | POA: Diagnosis not present

## 2015-07-20 DIAGNOSIS — R269 Unspecified abnormalities of gait and mobility: Secondary | ICD-10-CM | POA: Diagnosis not present

## 2015-07-20 DIAGNOSIS — M21372 Foot drop, left foot: Secondary | ICD-10-CM | POA: Diagnosis not present

## 2015-07-20 NOTE — Patient Instructions (Addendum)
1.  Stop depakote  2.  Continue keppra 1500mg  twice daily 3.  Encouraged to use walker 4.  Please call if you would like to start home physical therapy 5.  Return to clinic 4-6 months  [place] that can locate patients outside the home:  http://mobilealertsystems.com/ 534-073-1082 http://www.lifelinesys.com/content/lifeline-products/get-life-gosafe  [phone] www.verizonwireless.com/sure/ 5874410014  Medial Alert systems that link to smart phones:  http://www.lifelinesys.com/content/lifeline-products/response-app https://www.stanton.info/ RecycleRoad.pl.aspx  Al[employer] GPS Tracker:  VoiceZap.is 385-251-5026

## 2015-07-20 NOTE — Progress Notes (Signed)
Follow-up Visit   Date: 07/20/2015   ADRIAAN ZETTLE MRN: [id number] DOB: 04/24/1934   Interim History: EROS GREENFIELD is a 82 y.o. caucasian male with hypertension, hyperlipidemia, GERD, DM, CNS aneurysm s/p coiling ([facility], 2002), and SDH s/p craniotomy (0000000) complicated by seizure disorder returning to the clinic for follow-up of seizures disorder and dementia.   History of present illness: In April 2014, he sustained a fall and became acutely encephalopathic. He was admitted from 4/3 - 09/30/2012 for left SDH and underwent evacuation x 2 and placed on seizure prophylaxis with keppra. He was eventually discharged to rehab facility for TBI. Since he was doing well, in July 2014, trial of weaning Keppra was performed. However, on 8/19 he fell over the front porch chair while climbing the stairs and when his wife went to see him, she noticed his arm was shaking.   Regarding his gait, he reports noticing problems only after Keppra was starting but he also had a SDH at the same time. Looking back at Epic notes, it appears that even in 02/2012 he had a fall with severe facial hematoma and fractured T7 vertebra, so it is likely that symptoms have been longer than what he describes. He had myelogram which showed high-grade stenosis at T7 and was evaluated by Dr. Ronnald Ramp, neurosurgeon. They advised that a watchful waiting or surgery and he elected the conservative option. He feels as if his balance is "off" and very unsteady. He has been walking with a cane since April.  04/21/2013 Follow-up: Labs indicated he 12 deficiency and EMG was consistent with a moderate generalized sensorimotor polyneuropathy. B12 supplementation was started. Of note, on 10/29 he had a grand mal seizures and was taken to the hospital. Keppra increased to 1000mg  BID.  05/18/2014:   Patient was last seen > 1 year ago.  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  He reports to being compliant with his medications.  He is not driving.  He retired in September 2015.  No seizures since August and he has no new complaints.  Gait remains unsteady and he is walking with a cane.    UPDATE 08/17/2014:  Soon after his last clinic visit, his son called and told us that patient actually had 3 seizures within 3 weeks, which the paitent did not disclose to [place].  Therefore, his Keppra was increased to 1500mg  BID and he reports being compliant with it.    UPDATE 11/05/2014:  Mr. Scalone was admitted to [facility] with breakthrough seizures, likely in the setting of medication noncompliance as his keppra level on admission was not detected. He reportedly had several seizures at home prior to presentation and another one in the emergency department. Keppra 1500mg  twice daily was restarted and he was loaded with Depakote 1000mg  and started on 500mg  twice daily.  Repeat MRI brain did not show any new findings, known lobulated giant right ICA terminus aneurysm appears stable encompassing 30 x 18 x 21 mm.   Son reports noticing that patient is having memory problems and getting lost at times.  He was not able to make his PCP's visit because of getting lost and arriving late.  He has accrued late fees on some  of his bills.  Regarding his last admission, his son noticed that his Keppra was in the medicine bottle, but had not been placed in his pillbox, so suspected that he had not been taking it.  He has not set up POA and still manages his own finances.  He is not driving.  Home health now comes to manage his medications.    UPDATE 12/17/2014:  Patient arrived > 1hr late for his appointment.  He looks dishelved and poorly groomed. He  complains of stubbing his left toe and having poor wound healing with it.  He is very quite today and does not engage in conversation.  He is looking at retirement homes to move into.  He does not feel that he needs assisted living, despite my disagreement with this.  No interval seizures.   UPDATE 03/07/2015:  Son and daughter feel that over the past 3 months, his walking is much worse and he is sleeping a lot more than usual.  He sleeps around 7:30pm and wakes up at 10am. Son's girlfriend comes as an aid to help manage medications and cleaning.  He started using a walker and has not had any falls, but his walking is much worse than before.  Son says it takes him 15 minutes to walk 70 feet and is much more sedentary.  He refused to allow PT help, so it was discontinued.   UPDATE 07/20/2015:  He is accompanied by his son to today's visit. Since his last visit, patient has lumbar puncture with high volume tap to evaluate for NPH, which did not demonstrate significant improvement in his gait.  He also had MRI brain and lumbar spine for his left leg weakness.  No significant stenosis is seen in lumbar imaging.  He has a known RIGHT ICA treated giant aneurysm and no new stroke. He has not had any interval seizures, despite reducing his depakote.  He would like discontinue the depakote 250mg  at bedtime all together.  Patient denies falling, but son says that he falls once per month.  Despite multiple conversations with myself and his children who agree he would be better in an assisted living facility/SNF, Mr. Relyea continues to live independently.  He has a caregiver come 10 hours per week to prepare meals.  Memory remains fair, but son notes that he is much better about taking his medications as prescribed.     Medications:  Current Outpatient Prescriptions on File Prior to Visit  Medication Sig Dispense Refill  . cyanocobalamin 2000 MCG tablet Take 2,000 mcg by mouth at bedtime. Vitamin B12    . ferrous  sulfate 325 (65 FE) MG tablet Take 325 mg by mouth at bedtime.    . levETIRAcetam (KEPPRA) 750 MG tablet Take 2 tablets (1,500 mg total) by mouth 2 (two) times daily. 360 tablet 3  . levothyroxine (SYNTHROID, LEVOTHROID) 50 MCG tablet Take 1 tablet (50 mcg total) by mouth daily before breakfast. 100 tablet 3  . levothyroxine (SYNTHROID, LEVOTHROID) 75 MCG tablet Take 1 tablet (75 mcg total) by mouth daily. 90 tablet 1  . metFORMIN (GLUCOPHAGE) 500 MG tablet TAKE 1 TABLET BY MOUTH 2 TIMES DAILY WITH A MEAL. 200 tablet 2  . Multiple Vitamin (MULTIVITAMIN WITH MINERALS) TABS tablet Take 1 tablet by mouth at bedtime. Centrum    . omeprazole (PRILOSEC) 20 MG capsule TAKE 1 CAPSULE (20 MG TOTAL) BY MOUTH EVERY MONDAY, WEDNESDAY, AND FRIDAY. 100 capsule 2  . ONE TOUCH LANCETS MISC 1 each by Does  not apply route daily. 200 each 0  . simvastatin (ZOCOR) 10 MG tablet TAKE 1 TABLET (10 MG TOTAL) BY MOUTH AT BEDTIME. 100 tablet 2   No current facility-administered medications on file prior to visit.    Allergies: No Known Allergies   Review of Systems:  CONSTITUTIONAL: No fevers, chills, night sweats, or weight loss.   EYES: No visual changes or eye pain ENT: No hearing changes.  No history of nose bleeds.   RESPIRATORY: No cough, wheezing and shortness of breath.   CARDIOVASCULAR: Negative for chest pain, and palpitations.   GI: Negative for abdominal discomfort, blood in stools or black stools.  No recent change in bowel habits.   GU:  No history of incontinence.   MUSCLOSKELETAL: No history of joint pain or swelling.  No myalgias.   SKIN: No lesions, rash, and itching.   ENDOCRINE: Negative for cold or heat intolerance, polydipsia or goiter.   PSYCH:  +depression or anxiety symptoms.   NEURO: As Above.   Vital Signs:  BP 120/84 mmHg  Pulse 65  Wt 186 lb 5 oz (84.511 kg)  SpO2 95%   Neurological Exam: MENTAL STATUS:  Unshaven, dressed appropriately.  He is slow to process one-step  commands and has to be asked repeatedly at times.  He is oriented to person, place, year, and month.  CRANIAL NERVES: Pupils equal round and reactive to light.  Restricted upgaze bilaterally, otherwise extra-ocular eye movements intact. Normal facial sensation.  Mild left facial droop.   MOTOR:  Left hip flexion is 5/5 (improved), but left distal leg still with 4+/5.  Bilateral UE and RLE is 5/5 throughout except 5-/5 instrinsic hand muscles. Tone is slightly increased in LLE.    COORDINATION/GAIT:  Very unsteady and ataxic gait, significant left foot dragging, even with using a walker.   Data: MRI brain wo contrast 10/03/2014: 1. Stable.  No acute intracranial abnormality. 2. Sequelae of right ICA occlusion with stable thrombosed appearing giant right ICA terminus aneurysm up to 3 cm. 3. 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 Sequelae of right frontal craniotomy. 4. Cerebral volume loss with suspected ex vacuo ventricle enlargement and nonspecific chronic white matter signal changes.  EMG 04/13/2013: 1. Generalized large fiber sensorimotor polyneuropathy affecting the left side; moderate in degree electrically. A mild superimposed L5-S1 radiculopathy cannot be excluded. 2. Left median neuropathy at or distal to the wrist consistent with the clinical diagnosis of carpal tunnel syndrome, overall these changes are mild-moderate in degree electrically. 3. Mild left ulnar neuropathy at the elbow with purely demyelinating features evidenced as conduction velocity slowing.  MRI lumbar spine wo contrast 04/08/2015: Mild central canal narrowing L3-4 without nerve root compression. 0.4 cm anterolisthesis L4 on L5 due to facet arthropathy. The central canal and foramina remain open this level. Mild scoliosis.  Lab Results  Component Value Date   TSH 7.67* 04/18/2015   Lab Results  Component Value Date   HGBA1C 6.4* 10/02/2014   Lab Results  Component Value Date   [id number] 11/05/2014   PROBLEM LIST: 1.  Seizure disorder 2.  Multifactorial dementia 3.  Gait disorder 4.  History of SDH in 09/2012 s/p evacuation 5.  Right clinoid calcified meningioma vs thrombosed giant ICA aneurysm 6. High grade T7 central canal stenosis with T7 vertebral fracture s/p fall (02/2012).  Previously seen Dr. Ronnald Ramp in Neurosurgery, patient elected conservative management    IMPRESSION/PLAN: 1. Left leg weakness - imrpoved, but still with persistent left foot weakness  -  MRI lumbar spine without significant stenosis to explain weakness  - MRI brain does not show new stroke, but there is localizedsiderosis over the right sylvian fissure most likely causing left leg weakness  2.  Ventriculomegaly  - High volume spinal tap did not show marked change in gait pattern so unlikely to represent NPH  3.  Multifactorial gait abnormality s/p frequent falls, secondary to large fiber polyneuropathy and lumbosacral radiculopathy, new left foot drop  - Counseled on fall precautions at length especially as he is dragging the left foot  - Encouraged rollator  - He would benefit from home PT, but declined  4.  Seizure disorder, history of subdural hematoma 09/2012, no interval seizures - Started for seizure prophylaxis and subsequent breakthrough seizures (04/15/2013, 02/05/2014, 06/02/2015, 09/2014)  - Continue Keppra 1500mg  BID.    - Stop depakote. I am not convinced that he needs a second antiepileptic agent as his most recent seizure occurred in the setting of medication noncomplaince  5.  Multifactorial dementia, severe and interfering with IADLs and ADLs  - Patient is very reluctant to move into assisted living facility which we discussed at length   6.  Home safety issues discussed.  Information provided on Life Alert systems.  Return to clinic in 4-6 months   The duration of this appointment visit was 30 minutes of face-to-face time with the patient.  Greater than 50%  of this time was spent in counseling, explanation of diagnosis, planning of further management, and coordination of care.   Thank you for allowing me to participate in patient's care.  If I can answer any additional questions, I would be pleased to do so.    Sincerely,    Kashmere Daywalt K. Posey Pronto, DO

## 2015-08-04 ENCOUNTER — Emergency Department (HOSPITAL_COMMUNITY): Payer: Medicare Other

## 2015-08-04 ENCOUNTER — Inpatient Hospital Stay (HOSPITAL_COMMUNITY)
Admission: EM | Admit: 2015-08-04 | Discharge: 2015-08-09 | DRG: 101 | Disposition: A | Discharge status: Skilled Nursing Facility | Payer: Medicare Other | Attending: Internal Medicine | Admitting: Internal Medicine

## 2015-08-04 ENCOUNTER — Encounter (HOSPITAL_COMMUNITY): Payer: Self-pay | Admitting: Emergency Medicine

## 2015-08-04 DIAGNOSIS — K219 Gastro-esophageal reflux disease without esophagitis: Secondary | ICD-10-CM | POA: Diagnosis not present

## 2015-08-04 DIAGNOSIS — R569 Unspecified convulsions: Secondary | ICD-10-CM | POA: Diagnosis not present

## 2015-08-04 DIAGNOSIS — R079 Chest pain, unspecified: Secondary | ICD-10-CM | POA: Diagnosis not present

## 2015-08-04 DIAGNOSIS — E119 Type 2 diabetes mellitus without complications: Secondary | ICD-10-CM

## 2015-08-04 DIAGNOSIS — M6281 Muscle weakness (generalized): Secondary | ICD-10-CM | POA: Diagnosis not present

## 2015-08-04 DIAGNOSIS — S0990XA Unspecified injury of head, initial encounter: Secondary | ICD-10-CM | POA: Diagnosis not present

## 2015-08-04 DIAGNOSIS — R9401 Abnormal electroencephalogram [EEG]: Secondary | ICD-10-CM | POA: Diagnosis not present

## 2015-08-04 DIAGNOSIS — Z79899 Other long term (current) drug therapy: Secondary | ICD-10-CM

## 2015-08-04 DIAGNOSIS — E032 Hypothyroidism due to medicaments and other exogenous substances: Secondary | ICD-10-CM | POA: Diagnosis not present

## 2015-08-04 DIAGNOSIS — G40909 Epilepsy, unspecified, not intractable, without status epilepticus: Secondary | ICD-10-CM | POA: Diagnosis not present

## 2015-08-04 DIAGNOSIS — G40409 Other generalized epilepsy and epileptic syndromes, not intractable, without status epilepticus: Principal | ICD-10-CM | POA: Diagnosis present

## 2015-08-04 DIAGNOSIS — E039 Hypothyroidism, unspecified: Secondary | ICD-10-CM | POA: Diagnosis not present

## 2015-08-04 DIAGNOSIS — F0390 Unspecified dementia without behavioral disturbance: Secondary | ICD-10-CM | POA: Diagnosis present

## 2015-08-04 DIAGNOSIS — M25552 Pain in left hip: Secondary | ICD-10-CM | POA: Diagnosis not present

## 2015-08-04 DIAGNOSIS — Z7984 Long term (current) use of oral hypoglycemic drugs: Secondary | ICD-10-CM | POA: Diagnosis not present

## 2015-08-04 DIAGNOSIS — M25551 Pain in right hip: Secondary | ICD-10-CM | POA: Diagnosis not present

## 2015-08-04 DIAGNOSIS — R2681 Unsteadiness on feet: Secondary | ICD-10-CM | POA: Insufficient documentation

## 2015-08-04 DIAGNOSIS — S3993XA Unspecified injury of pelvis, initial encounter: Secondary | ICD-10-CM | POA: Diagnosis not present

## 2015-08-04 DIAGNOSIS — S72009A Fracture of unspecified part of neck of unspecified femur, initial encounter for closed fracture: Secondary | ICD-10-CM

## 2015-08-04 DIAGNOSIS — M79605 Pain in left leg: Secondary | ICD-10-CM | POA: Diagnosis not present

## 2015-08-04 DIAGNOSIS — G20 Parkinson's disease: Secondary | ICD-10-CM | POA: Insufficient documentation

## 2015-08-04 DIAGNOSIS — S199XXA Unspecified injury of neck, initial encounter: Secondary | ICD-10-CM | POA: Diagnosis not present

## 2015-08-04 DIAGNOSIS — S3991XA Unspecified injury of abdomen, initial encounter: Secondary | ICD-10-CM | POA: Diagnosis not present

## 2015-08-04 LAB — CBC WITH DIFFERENTIAL/PLATELET
Basophils Absolute: 0 10*3/uL (ref 0.0–0.1)
Basophils Relative: 0 %
EOS PCT: 1 %
Eosinophils Absolute: 0.1 10*3/uL (ref 0.0–0.7)
HEMATOCRIT: 45 % (ref 39.0–52.0)
Hemoglobin: 15.3 g/dL (ref 13.0–17.0)
LYMPHS PCT: 35 %
Lymphs Abs: 2.6 10*3/uL (ref 0.7–4.0)
MCH: 29.1 pg (ref 26.0–34.0)
MCHC: 34 g/dL (ref 30.0–36.0)
MCV: 85.6 fL (ref 78.0–100.0)
MONO ABS: 0.5 10*3/uL (ref 0.1–1.0)
MONOS PCT: 7 %
NEUTROS ABS: 4.2 10*3/uL (ref 1.7–7.7)
Neutrophils Relative %: 57 %
Platelets: 193 10*3/uL (ref 150–400)
RBC: 5.26 MIL/uL (ref 4.22–5.81)
RDW: 13.3 % (ref 11.5–15.5)
WBC: 7.4 10*3/uL (ref 4.0–10.5)

## 2015-08-04 LAB — COMPREHENSIVE METABOLIC PANEL
ALT: 15 U/L — ABNORMAL LOW (ref 17–63)
ANION GAP: 13 (ref 5–15)
AST: 22 U/L (ref 15–41)
Albumin: 4 g/dL (ref 3.5–5.0)
Alkaline Phosphatase: 69 U/L (ref 38–126)
BILIRUBIN TOTAL: 0.3 mg/dL (ref 0.3–1.2)
BUN: 12 mg/dL (ref 6–20)
CO2: 25 mmol/L (ref 22–32)
Calcium: 9.5 mg/dL (ref 8.9–10.3)
Chloride: 99 mmol/L — ABNORMAL LOW (ref 101–111)
Creatinine, Ser: 0.77 mg/dL (ref 0.61–1.24)
Glucose, Bld: 159 mg/dL — ABNORMAL HIGH (ref 65–99)
POTASSIUM: 4.2 mmol/L (ref 3.5–5.1)
Sodium: 137 mmol/L (ref 135–145)
TOTAL PROTEIN: 7.4 g/dL (ref 6.5–8.1)

## 2015-08-04 LAB — CBG MONITORING, ED: GLUCOSE-CAPILLARY: 136 mg/dL — AB (ref 65–99)

## 2015-08-04 LAB — PROTIME-INR
INR: 0.93 (ref 0.00–1.49)
Prothrombin Time: 12.7 seconds (ref 11.6–15.2)

## 2015-08-04 LAB — MAGNESIUM: MAGNESIUM: 2 mg/dL (ref 1.7–2.4)

## 2015-08-04 LAB — URINALYSIS, ROUTINE W REFLEX MICROSCOPIC
Bilirubin Urine: NEGATIVE
GLUCOSE, UA: NEGATIVE mg/dL
HGB URINE DIPSTICK: NEGATIVE
KETONES UR: NEGATIVE mg/dL
Leukocytes, UA: NEGATIVE
Nitrite: NEGATIVE
PROTEIN: NEGATIVE mg/dL
Specific Gravity, Urine: 1.017 (ref 1.005–1.030)
pH: 7 (ref 5.0–8.0)

## 2015-08-04 LAB — VALPROIC ACID LEVEL

## 2015-08-04 LAB — I-STAT TROPONIN, ED: TROPONIN I, POC: 0 ng/mL (ref 0.00–0.08)

## 2015-08-04 LAB — APTT: aPTT: 36 seconds (ref 24–37)

## 2015-08-04 MED ORDER — DEXTROSE 5 % IV SOLN
1000.0000 mg | Freq: Once | INTRAVENOUS | Status: DC
  Filled 2015-08-04: qty 10

## 2015-08-04 MED ORDER — LAMOTRIGINE 25 MG PO TABS
25.0000 mg | ORAL_TABLET | Freq: Once | ORAL | Status: AC
  Administered 2015-08-04: 25 mg via ORAL
  Filled 2015-08-04: qty 1

## 2015-08-04 MED ORDER — SODIUM CHLORIDE 0.9 % IV BOLUS (SEPSIS)
1000.0000 mL | Freq: Once | INTRAVENOUS | Status: AC
  Administered 2015-08-05: 1000 mL via INTRAVENOUS

## 2015-08-04 NOTE — Progress Notes (Signed)
Orthopedic Tech Progress Note Patient Details:  Gerald Leach 06/18/1873 [id number] Level 2 trauma ortho visit. [name], unknown   DOB: 06/18/1873, 82 y.o.   MRN: [id number]   [name] 08/04/2015, 6:02 PM

## 2015-08-04 NOTE — ED Notes (Signed)
Dr. Oleta Mouse advised patient does not need to be a level 2 at this time, Dr. Oleta Mouse requested patient be placed on seizure precautions
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
## 2015-08-04 NOTE — ED Notes (Signed)
Moved to room 15.  

## 2015-08-04 NOTE — ED Notes (Signed)
Upon entering the room, this RN spoke to patient and asked if he needed anything, patient was awake with eyes open, blinking his eyes but would not answer RN, upon calling patients name multiple times, patient looked at this RN, this RN asked patient if he knew where he was patient reported he was in a hospital, patient would not answer his name or any other questions stating, "I'm not going to tell you anything else, im tired of answering stupid questions." pt appeared very fidgetty, wringing his hands and tapping his fingers.
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
## 2015-08-05 ENCOUNTER — Observation Stay (HOSPITAL_COMMUNITY): Payer: Medicare Other

## 2015-08-05 ENCOUNTER — Encounter (HOSPITAL_COMMUNITY): Payer: Self-pay | Admitting: Internal Medicine

## 2015-08-05 DIAGNOSIS — S3993XA Unspecified injury of pelvis, initial encounter: Secondary | ICD-10-CM | POA: Diagnosis not present

## 2015-08-05 DIAGNOSIS — R079 Chest pain, unspecified: Secondary | ICD-10-CM | POA: Diagnosis not present

## 2015-08-05 DIAGNOSIS — S3991XA Unspecified injury of abdomen, initial encounter: Secondary | ICD-10-CM | POA: Diagnosis not present

## 2015-08-05 DIAGNOSIS — E032 Hypothyroidism due to medicaments and other exogenous substances: Secondary | ICD-10-CM | POA: Diagnosis not present

## 2015-08-05 DIAGNOSIS — E039 Hypothyroidism, unspecified: Secondary | ICD-10-CM | POA: Diagnosis present

## 2015-08-05 DIAGNOSIS — R569 Unspecified convulsions: Secondary | ICD-10-CM | POA: Diagnosis not present

## 2015-08-05 DIAGNOSIS — E119 Type 2 diabetes mellitus without complications: Secondary | ICD-10-CM

## 2015-08-05 LAB — COMPREHENSIVE METABOLIC PANEL
ALT: 15 U/L — ABNORMAL LOW (ref 17–63)
ANION GAP: 10 (ref 5–15)
AST: 20 U/L (ref 15–41)
Albumin: 3.4 g/dL — ABNORMAL LOW (ref 3.5–5.0)
Alkaline Phosphatase: 65 U/L (ref 38–126)
BILIRUBIN TOTAL: 0.9 mg/dL (ref 0.3–1.2)
BUN: 7 mg/dL (ref 6–20)
CHLORIDE: 103 mmol/L (ref 101–111)
CO2: 23 mmol/L (ref 22–32)
Calcium: 8.9 mg/dL (ref 8.9–10.3)
Creatinine, Ser: 0.73 mg/dL (ref 0.61–1.24)
Glucose, Bld: 122 mg/dL — ABNORMAL HIGH (ref 65–99)
POTASSIUM: 4.1 mmol/L (ref 3.5–5.1)
Sodium: 136 mmol/L (ref 135–145)
TOTAL PROTEIN: 6.5 g/dL (ref 6.5–8.1)

## 2015-08-05 LAB — TROPONIN I

## 2015-08-05 LAB — CBC WITH DIFFERENTIAL/PLATELET
BASOS ABS: 0 10*3/uL (ref 0.0–0.1)
Basophils Relative: 0 %
EOS PCT: 1 %
Eosinophils Absolute: 0.1 10*3/uL (ref 0.0–0.7)
HCT: 43 % (ref 39.0–52.0)
HEMOGLOBIN: 14.6 g/dL (ref 13.0–17.0)
LYMPHS ABS: 2.3 10*3/uL (ref 0.7–4.0)
LYMPHS PCT: 22 %
MCH: 28.8 pg (ref 26.0–34.0)
MCHC: 34 g/dL (ref 30.0–36.0)
MCV: 84.8 fL (ref 78.0–100.0)
Monocytes Absolute: 1.1 10*3/uL — ABNORMAL HIGH (ref 0.1–1.0)
Monocytes Relative: 10 %
NEUTROS ABS: 6.7 10*3/uL (ref 1.7–7.7)
NEUTROS PCT: 67 %
PLATELETS: 177 10*3/uL (ref 150–400)
RBC: 5.07 MIL/uL (ref 4.22–5.81)
RDW: 13.4 % (ref 11.5–15.5)
WBC: 10.2 10*3/uL (ref 4.0–10.5)

## 2015-08-05 LAB — GLUCOSE, CAPILLARY
GLUCOSE-CAPILLARY: 115 mg/dL — AB (ref 65–99)
GLUCOSE-CAPILLARY: 165 mg/dL — AB (ref 65–99)
GLUCOSE-CAPILLARY: 127 mg/dL — AB (ref 65–99)
Glucose-Capillary: 124 mg/dL — ABNORMAL HIGH (ref 65–99)

## 2015-08-05 MED ORDER — SODIUM CHLORIDE 0.9 % IV SOLN
100.0000 mg | Freq: Two times a day (BID) | INTRAVENOUS | Status: DC
  Administered 2015-08-05 (×3): 100 mg via INTRAVENOUS
  Filled 2015-08-05 (×5): qty 10

## 2015-08-05 MED ORDER — ONDANSETRON HCL 4 MG/2ML IJ SOLN: 4.0000 mg | Freq: Four times a day (QID) | INTRAMUSCULAR | Status: DC | PRN

## 2015-08-05 MED ORDER — INSULIN ASPART 100 UNIT/ML ~~LOC~~ SOLN
0.0000 [IU] | Freq: Three times a day (TID) | SUBCUTANEOUS | Status: DC
  Administered 2015-08-05: 1 [IU] via SUBCUTANEOUS
  Administered 2015-08-05 – 2015-08-06 (×2): 2 [IU] via SUBCUTANEOUS
  Administered 2015-08-08 – 2015-08-09 (×3): 1 [IU] via SUBCUTANEOUS

## 2015-08-05 MED ORDER — ACETAMINOPHEN 650 MG RE SUPP: 650.0000 mg | Freq: Four times a day (QID) | RECTAL | Status: DC | PRN

## 2015-08-05 MED ORDER — LEVETIRACETAM 750 MG PO TABS
1500.0000 mg | ORAL_TABLET | Freq: Two times a day (BID) | ORAL | Status: DC
  Administered 2015-08-05 – 2015-08-09 (×9): 1500 mg via ORAL
  Filled 2015-08-05 (×9): qty 2

## 2015-08-05 MED ORDER — ONDANSETRON HCL 4 MG PO TABS: 4.0000 mg | ORAL_TABLET | Freq: Four times a day (QID) | ORAL | Status: DC | PRN

## 2015-08-05 MED ORDER — HYDRALAZINE HCL 20 MG/ML IJ SOLN: 10.0000 mg | INTRAMUSCULAR | Status: DC | PRN

## 2015-08-05 MED ORDER — ACETAMINOPHEN 325 MG PO TABS: 650.0000 mg | ORAL_TABLET | Freq: Four times a day (QID) | ORAL | Status: DC | PRN

## 2015-08-05 MED ORDER — PANTOPRAZOLE SODIUM 40 MG PO TBEC
40.0000 mg | DELAYED_RELEASE_TABLET | Freq: Every day | ORAL | Status: DC
  Administered 2015-08-05 – 2015-08-09 (×5): 40 mg via ORAL
  Filled 2015-08-05 (×5): qty 1

## 2015-08-05 MED ORDER — IOHEXOL 350 MG/ML SOLN
100.0000 mL | Freq: Once | INTRAVENOUS | Status: AC | PRN
  Administered 2015-08-05: 100 mL via INTRAVENOUS

## 2015-08-05 MED ORDER — SODIUM CHLORIDE 0.9 % IV SOLN
INTRAVENOUS | Status: AC
  Administered 2015-08-05: 06:00:00 via INTRAVENOUS

## 2015-08-05 MED ORDER — LEVOTHYROXINE SODIUM 75 MCG PO TABS
75.0000 ug | ORAL_TABLET | Freq: Every day | ORAL | Status: DC
  Administered 2015-08-05 – 2015-08-09 (×5): 75 ug via ORAL
  Filled 2015-08-05 (×5): qty 1
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
## 2015-08-05 NOTE — ED Notes (Signed)
Patient transported to CT 

## 2015-08-05 NOTE — Progress Notes (Signed)
Pt +2 assist to bedside commode. Pt unsteady on feet.

## 2015-08-05 NOTE — ED Notes (Signed)
Unable to take report at this time.
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
## 2015-08-05 NOTE — ED Notes (Addendum)
Attends changed - patient incont  Door open to observe patient   Patient requested to have a "diaper" placed on him because he leaks urine all night long

## 2015-08-05 NOTE — ED Notes (Signed)
Dr Leonel Ramsay in to see patient

## 2015-08-05 NOTE — Progress Notes (Signed)
Hourly rounding performed. Call light within reach. Pt in no acute distress. Denies needs.  Family at bedside.  

## 2015-08-05 NOTE — Progress Notes (Signed)
Pt transferred from chair to bed.  °

## 2015-08-05 NOTE — Care Management Obs Status (Signed)
[place] NOTIFICATION   Patient Details  Name: Gerald Leach MRN: [id number] Date of Birth: 12-Sep-1933   Medicare Observation Status Notification Given:  Yes Patient presented with breakthrough seizure for medication adjustment. Appropriate of observation status per Medicare guidelines.   Rolm Baptise, RN 08/05/2015, 3:51 PM

## 2015-08-05 NOTE — Progress Notes (Signed)
Pt 2+ assist to get to bedside commode that is next to bed. Pt while pivoting started urinating on ground. Unsure of pts cognition comprehension.

## 2015-08-06 DIAGNOSIS — R569 Unspecified convulsions: Secondary | ICD-10-CM | POA: Diagnosis not present

## 2015-08-06 LAB — GLUCOSE, CAPILLARY
GLUCOSE-CAPILLARY: 118 mg/dL — AB (ref 65–99)
GLUCOSE-CAPILLARY: 169 mg/dL — AB (ref 65–99)
GLUCOSE-CAPILLARY: 91 mg/dL (ref 65–99)
GLUCOSE-CAPILLARY: 173 mg/dL — AB (ref 65–99)

## 2015-08-06 MED ORDER — LACOSAMIDE 50 MG PO TABS
100.0000 mg | ORAL_TABLET | Freq: Two times a day (BID) | ORAL | Status: DC
  Administered 2015-08-06: 100 mg via ORAL
  Filled 2015-08-06: qty 2

## 2015-08-06 MED ORDER — LACOSAMIDE 50 MG PO TABS
50.0000 mg | ORAL_TABLET | Freq: Two times a day (BID) | ORAL | Status: DC
  Administered 2015-08-06 – 2015-08-09 (×6): 50 mg via ORAL
  Filled 2015-08-06 (×6): qty 1

## 2015-08-06 MED ORDER — AMANTADINE HCL 100 MG PO CAPS
100.0000 mg | ORAL_CAPSULE | Freq: Every day | ORAL | Status: DC
  Administered 2015-08-06 – 2015-08-09 (×4): 100 mg via ORAL
  Filled 2015-08-06 (×4): qty 1
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
## 2015-08-07 LAB — GLUCOSE, CAPILLARY
GLUCOSE-CAPILLARY: 154 mg/dL — AB (ref 65–99)
Glucose-Capillary: 104 mg/dL — ABNORMAL HIGH (ref 65–99)
Glucose-Capillary: 151 mg/dL — ABNORMAL HIGH (ref 65–99)
Glucose-Capillary: 121 mg/dL — ABNORMAL HIGH (ref 65–99)
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
## 2015-08-08 ENCOUNTER — Inpatient Hospital Stay (HOSPITAL_COMMUNITY): Payer: Medicare Other

## 2015-08-08 DIAGNOSIS — R569 Unspecified convulsions: Secondary | ICD-10-CM | POA: Diagnosis not present

## 2015-08-08 DIAGNOSIS — E032 Hypothyroidism due to medicaments and other exogenous substances: Secondary | ICD-10-CM

## 2015-08-08 DIAGNOSIS — R9401 Abnormal electroencephalogram [EEG]: Secondary | ICD-10-CM | POA: Diagnosis not present

## 2015-08-08 LAB — GLUCOSE, CAPILLARY
GLUCOSE-CAPILLARY: 106 mg/dL — AB (ref 65–99)
GLUCOSE-CAPILLARY: 136 mg/dL — AB (ref 65–99)
GLUCOSE-CAPILLARY: 114 mg/dL — AB (ref 65–99)
Glucose-Capillary: 115 mg/dL — ABNORMAL HIGH (ref 65–99)

## 2015-08-08 NOTE — Progress Notes (Signed)
Pt up to chair for dinner.  Will continue to monitor. Cori Razor, RN

## 2015-08-08 NOTE — Clinical Social Work Note (Signed)
Clinical Social Work Assessment  Patient Details  Name: Gerald Leach MRN: [id number] Date of Birth: [birth year]  Date of referral:  08/08/15               Reason for consult:  Facility Placement                Permission sought to share information with:  Case Manager, Family Supports Permission granted to share information::  Yes, Verbal Permission Granted  Name::     Taydin Duncan  Agency::     Relationship::  Son   Contact Information:  [phone]  Housing/Transportation Living arrangements for the past 2 months:  Single Family Home Source of Information:  Adult Children Patient Interpreter Needed:  None Criminal Activity/Legal Involvement Pertinent to Current Situation/Hospitalization:  No - Comment as needed Significant Relationships:  Adult Children, Spouse Lives with:  Spouse Do you feel safe going back to the place where you live?  Yes Need for family participation in patient care:  Yes (Comment)  Care giving concerns:  Per son, patient will not have 24 hour assistance at home and believes it would be safer for patient to go to a skilled nursing facility.    Social Worker assessment / plan:  CSW received consult. CSW went to speak with patient regarding the possible need for short term rehab. CSW introduced self and acknowledged the patient. Patient is confused and disoriented. No family at bedside. CSW contacted son [name] to discuss PT recommendation for short term rehab. Son is agreeable to SNF placement for the patient. Son reports his mother has dementia and is not able to provide much support to patient. Son also reports that his wife spends about 10-15 hours each week at their home assisting with household chores, etc. Son informed CSW that he is familiar with the list of SNF's in [facility], and reports the patient has been to [facility] in the past. Per Son, this would be the family's first preference. CSW was provided permission to fax clinical information out to the  facilities. FL2 complete. CSW to initiate SNF placement process.    Employment status:  Disabled (Comment on whether or not currently receiving Disability) Insurance information:  Managed Medicare PT Recommendations:  [place] / Referral to community resources:     Patient/Family's Response to care:  Son would like for the patient to receive the proper treatment that he needs. Son reports he does not think it would be a good idea to return unless 24 hour assistance can be provided.   Patient/Family's Understanding of and Emotional Response to Diagnosis, Current Treatment, and Prognosis:  Son was very cooperative with CSW assessment. Son is very involved in patient's care and is aware of current treatment and prognosis. Son is agreeable to disposition plan. Son voiced that he knows the patient would have liked to go home, however believes short term rehab will be very beneficial for him at this time. Son is appreciative of CSW intervention.    Emotional Assessment Appearance:  Appears stated age Attitude/Demeanor/Rapport:   (Confused and disoriented) Affect (typically observed):   (Confused ) Orientation:  Oriented to Self Alcohol / Substance use:  Not Applicable Psych involvement (Current and /or in the community):  No (Comment)  Discharge Needs  Concerns to be addressed:  Decision making concerns, Discharge Planning Concerns Readmission within the last 30 days:  No Current discharge risk:  Physical Impairment Barriers to Discharge:  Continued Medical Work up   [employer],  LCSW 08/08/2015, 2:14 PM

## 2015-08-08 NOTE — Progress Notes (Signed)
EEG completed; results pending.    

## 2015-08-08 NOTE — Progress Notes (Signed)
CSW attempted to get in contact with Admissions Director at [facility] regarding referral but received no answer. CSW left voice message at 2:29pm. CSW will continue to follow and provide support to patient while in hospital.   Lucius Conn, [place] Worker [facility] Ph: [phone]

## 2015-08-08 NOTE — Care Management Important Message (Signed)
Important Message  Patient Details  Name: Gerald Leach MRN: [id number] Date of Birth: 1934/01/01   Medicare Important Message Given:  Yes    Loann Quill 08/08/2015, 11:24 AM
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
## 2015-08-09 ENCOUNTER — Inpatient Hospital Stay (HOSPITAL_COMMUNITY): Payer: Medicare Other

## 2015-08-09 DIAGNOSIS — Z79899 Other long term (current) drug therapy: Secondary | ICD-10-CM | POA: Diagnosis not present

## 2015-08-09 DIAGNOSIS — M25552 Pain in left hip: Secondary | ICD-10-CM | POA: Diagnosis not present

## 2015-08-09 DIAGNOSIS — R569 Unspecified convulsions: Secondary | ICD-10-CM | POA: Diagnosis not present

## 2015-08-09 DIAGNOSIS — M6281 Muscle weakness (generalized): Secondary | ICD-10-CM | POA: Diagnosis not present

## 2015-08-09 DIAGNOSIS — G40909 Epilepsy, unspecified, not intractable, without status epilepticus: Secondary | ICD-10-CM | POA: Diagnosis not present

## 2015-08-09 DIAGNOSIS — K219 Gastro-esophageal reflux disease without esophagitis: Secondary | ICD-10-CM | POA: Diagnosis not present

## 2015-08-09 DIAGNOSIS — Z7984 Long term (current) use of oral hypoglycemic drugs: Secondary | ICD-10-CM | POA: Diagnosis not present

## 2015-08-09 DIAGNOSIS — E785 Hyperlipidemia, unspecified: Secondary | ICD-10-CM | POA: Diagnosis not present

## 2015-08-09 DIAGNOSIS — M25551 Pain in right hip: Secondary | ICD-10-CM | POA: Diagnosis not present

## 2015-08-09 DIAGNOSIS — G20 Parkinson's disease: Secondary | ICD-10-CM | POA: Diagnosis not present

## 2015-08-09 DIAGNOSIS — M79605 Pain in left leg: Secondary | ICD-10-CM | POA: Diagnosis not present

## 2015-08-09 DIAGNOSIS — E039 Hypothyroidism, unspecified: Secondary | ICD-10-CM | POA: Diagnosis not present

## 2015-08-09 DIAGNOSIS — E119 Type 2 diabetes mellitus without complications: Secondary | ICD-10-CM | POA: Diagnosis not present

## 2015-08-09 LAB — GLUCOSE, CAPILLARY
GLUCOSE-CAPILLARY: 127 mg/dL — AB (ref 65–99)
Glucose-Capillary: 129 mg/dL — ABNORMAL HIGH (ref 65–99)

## 2015-08-09 MED ORDER — HYDROCODONE-ACETAMINOPHEN 5-325 MG PO TABS
1.0000 | ORAL_TABLET | ORAL | Status: DC | PRN
  Administered 2015-08-09: 1 via ORAL
  Filled 2015-08-09: qty 1

## 2015-08-09 MED ORDER — AMANTADINE HCL 100 MG PO CAPS: 100.0000 mg | ORAL_CAPSULE | Freq: Every day | ORAL | Status: DC

## 2015-08-09 MED ORDER — LACOSAMIDE 50 MG PO TABS: 50.0000 mg | ORAL_TABLET | Freq: Two times a day (BID) | ORAL | Status: DC

## 2015-08-09 MED ORDER — HYDROCODONE-ACETAMINOPHEN 5-325 MG PO TABS: 1.0000 | ORAL_TABLET | ORAL | Status: DC | PRN

## 2015-08-09 MED ORDER — MORPHINE SULFATE (PF) 2 MG/ML IV SOLN
2.0000 mg | INTRAVENOUS | Status: DC | PRN
  Administered 2015-08-09: 2 mg via INTRAVENOUS
  Filled 2015-08-09: qty 1

## 2015-08-09 MED ORDER — LEVETIRACETAM 750 MG PO TABS: 1500.0000 mg | ORAL_TABLET | Freq: Two times a day (BID) | ORAL | Status: DC
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
## 2015-08-09 NOTE — Clinical Social Work Placement (Signed)
   CLINICAL SOCIAL WORK PLACEMENT  NOTE  Date:  08/09/2015  Patient Details  Name: Gerald Leach MRN: [id number] Date of Birth: 01-17-1934  Clinical Social Work is seeking post-discharge placement for this patient at the [place] level of care (*CSW will initial, date and re-position this form in  chart as items are completed):  Yes   Patient/family provided with [place] Work Department's list of facilities offering this level of care within the geographic area requested by the patient (or if unable, by the patient's family).  Yes   Patient/family informed of their freedom to choose among providers that offer the needed level of care, that participate in Medicare, Medicaid or managed care program needed by the patient, have an available bed and are willing to accept the patient.      Patient/family informed of [facility]'s ownership interest in [facility] and [facility], as well as of the fact that they are under no obligation to receive care at these facilities.  PASRR submitted to EDS on 08/09/15     PASRR number received on       Existing PASRR number confirmed on 05/18/13     FL2 transmitted to all facilities in geographic area requested by pt/family on 08/08/15     FL2 transmitted to all facilities within larger geographic area on       Patient informed that his/her managed care company has contracts with or will negotiate with certain facilities, including the following:        Yes   Patient/family informed of bed offers received.  Patient chooses bed at  [facility])     Physician recommends and patient chooses bed at      Patient to be transferred to  [facility]) on 08/09/15.  Patient to be transferred to facility by  [facility])     Patient family notified on 08/09/15 of transfer.  Name of family member notified:  Shirlean Mylar and [name]: Son and Daughter     PHYSICIAN Please prepare priority discharge summary, including  medications     Additional Comment:    _______________________________________________ Raymondo Band, LCSW 08/09/2015, 11:14 AM
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
## 2015-08-09 NOTE — Progress Notes (Signed)
patient has been seizure free since adding Vimpat. EEG shows no epileptiform activity. At this time neurology will S/O feel free to call with questions.   Etta Quill PA-C [facility] [phone]  08/09/2015, 9:42 AM

## 2015-08-09 NOTE — Progress Notes (Signed)
Transportation has been arranged for patient to transfer to [facility]. Son is aware. No further CSW needs requested at this time. CSW to sign off.   Lucius Conn, [place] Worker [facility] Ph: [phone]

## 2015-08-09 NOTE — Care Management Note (Signed)
Case Management Note  Patient Details  Name: AIRAM RAIFSNIDER MRN: [id number] Date of Birth: 1934/05/19  Subjective/Objective:                    Action/Plan: Plan is for patient to discharge to SNF today. No further needs per CM.   Expected Discharge Date:                  Expected Discharge Plan:     In-House Referral:     Discharge planning Services     Post Acute Care Choice:    Choice offered to:     DME Arranged:    DME Agency:     HH Arranged:    [place] Agency:     Status of Service:     Medicare Important Message Given:  Yes Date Medicare IM Given:    Medicare IM give by:    Date Additional Medicare IM Given:    Additional Medicare Important Message give by:     If discussed at [place] of Stay Meetings, dates discussed:    Additional Comments:  Pollie Friar, RN 08/09/2015, 11:25 AM

## 2015-08-09 NOTE — Progress Notes (Signed)
RN discussed discharge plan with patient, MD discussed discharge with family and patient. CSW arranged transport via [facility]. IV removed, tele removed, patient does not have any clothing of his own, dressed in cloth gown, only belongings at bedside are cane, no other belongings in cabinets or room. Report given to receiving nurse at [facility], patient's neuro assessment unchanged, skin intact, patient has no current complaints of pain, will continue to monitor closely.

## 2015-08-09 NOTE — Progress Notes (Signed)
Patient states his left leg is painful, states he cannot specify where pain is on leg, leg assessed, patient thinks most pain is in thigh region. MD notified, see XR

## 2015-08-09 NOTE — Progress Notes (Signed)
Patient has accepted bed offer at [facility] and [place]. Facility has been informed of family's selection. Per facility representative, patient can arrive to facility once discharge summary is received. CSW to inform family of transfer time when known. Family was very [place] attorney of CSW services.   Patient to be transported via [place] to [facility]. D/C Summary to be sent via Hub system. CSW will continue to follow and provide support while in hospital.   Lucius Conn, [place] Worker [facility] Ph: [phone]

## 2015-08-09 NOTE — Progress Notes (Signed)
CSW spoke with [facility] to provide bed offers. Son reports that he would really like for the patient to go to [facility]. CSW informed son that there is not a bed available at that facility. Son provided CSW SS# to submit for [facility]. Son informed CSW that he will need to discuss offers with family, and will follow back up with CSW. Son is aware that patient is medically stable and ready for discharge on today.   Lucius Conn, [place] Worker [facility] Ph: [phone]

## 2015-08-10 ENCOUNTER — Encounter (HOSPITAL_COMMUNITY): Payer: Self-pay | Admitting: Emergency Medicine

## 2015-08-10 ENCOUNTER — Observation Stay (HOSPITAL_COMMUNITY)
Admission: EM | Admit: 2015-08-10 | Discharge: 2015-08-12 | Disposition: A | Discharge status: Skilled Nursing Facility | Payer: Medicare Other | Attending: Family Medicine | Admitting: Family Medicine

## 2015-08-10 ENCOUNTER — Encounter: Payer: Self-pay | Admitting: Neurology

## 2015-08-10 DIAGNOSIS — E039 Hypothyroidism, unspecified: Secondary | ICD-10-CM | POA: Diagnosis not present

## 2015-08-10 DIAGNOSIS — E119 Type 2 diabetes mellitus without complications: Secondary | ICD-10-CM | POA: Diagnosis not present

## 2015-08-10 DIAGNOSIS — Z7984 Long term (current) use of oral hypoglycemic drugs: Secondary | ICD-10-CM | POA: Insufficient documentation

## 2015-08-10 DIAGNOSIS — G40909 Epilepsy, unspecified, not intractable, without status epilepticus: Principal | ICD-10-CM | POA: Insufficient documentation

## 2015-08-10 DIAGNOSIS — G20 Parkinson's disease: Secondary | ICD-10-CM | POA: Diagnosis not present

## 2015-08-10 DIAGNOSIS — K219 Gastro-esophageal reflux disease without esophagitis: Secondary | ICD-10-CM | POA: Diagnosis not present

## 2015-08-10 DIAGNOSIS — R2681 Unsteadiness on feet: Secondary | ICD-10-CM | POA: Insufficient documentation

## 2015-08-10 DIAGNOSIS — Z79899 Other long term (current) drug therapy: Secondary | ICD-10-CM | POA: Diagnosis not present

## 2015-08-10 DIAGNOSIS — R569 Unspecified convulsions: Secondary | ICD-10-CM

## 2015-08-10 DIAGNOSIS — E785 Hyperlipidemia, unspecified: Secondary | ICD-10-CM | POA: Insufficient documentation

## 2015-08-10 LAB — URINALYSIS, ROUTINE W REFLEX MICROSCOPIC
Bilirubin Urine: NEGATIVE
GLUCOSE, UA: NEGATIVE mg/dL
Hgb urine dipstick: NEGATIVE
Ketones, ur: NEGATIVE mg/dL
LEUKOCYTES UA: NEGATIVE
Nitrite: NEGATIVE
PH: 6 (ref 5.0–8.0)
PROTEIN: NEGATIVE mg/dL
SPECIFIC GRAVITY, URINE: 1.012 (ref 1.005–1.030)

## 2015-08-10 LAB — CBC WITH DIFFERENTIAL/PLATELET
BASOS ABS: 0 10*3/uL (ref 0.0–0.1)
Basophils Relative: 0 %
Eosinophils Absolute: 0.1 10*3/uL (ref 0.0–0.7)
Eosinophils Relative: 1 %
HEMATOCRIT: 48.1 % (ref 39.0–52.0)
HEMOGLOBIN: 16.3 g/dL (ref 13.0–17.0)
LYMPHS PCT: 28 %
Lymphs Abs: 2.3 10*3/uL (ref 0.7–4.0)
MCH: 29.1 pg (ref 26.0–34.0)
MCHC: 33.9 g/dL (ref 30.0–36.0)
MCV: 85.9 fL (ref 78.0–100.0)
Monocytes Absolute: 0.8 10*3/uL (ref 0.1–1.0)
Monocytes Relative: 10 %
NEUTROS ABS: 5.2 10*3/uL (ref 1.7–7.7)
NEUTROS PCT: 61 %
PLATELETS: 227 10*3/uL (ref 150–400)
RBC: 5.6 MIL/uL (ref 4.22–5.81)
RDW: 13.5 % (ref 11.5–15.5)
WBC: 8.5 10*3/uL (ref 4.0–10.5)

## 2015-08-10 LAB — BASIC METABOLIC PANEL
ANION GAP: 13 (ref 5–15)
BUN: 15 mg/dL (ref 6–20)
CO2: 21 mmol/L — ABNORMAL LOW (ref 22–32)
Calcium: 9.4 mg/dL (ref 8.9–10.3)
Chloride: 101 mmol/L (ref 101–111)
Creatinine, Ser: 0.91 mg/dL (ref 0.61–1.24)
GFR calc Af Amer: >60 mL/min (ref >60)
Glucose, Bld: 142 mg/dL — ABNORMAL HIGH (ref 65–99)
POTASSIUM: 4.5 mmol/L (ref 3.5–5.1)
SODIUM: 135 mmol/L (ref 135–145)

## 2015-08-10 MED ORDER — SODIUM CHLORIDE 0.9 % IV SOLN
100.0000 mg | Freq: Once | INTRAVENOUS | Status: AC
  Administered 2015-08-10: 100 mg via INTRAVENOUS
  Filled 2015-08-10: qty 10

## 2015-08-10 MED ORDER — LORAZEPAM 2 MG/ML IJ SOLN
INTRAMUSCULAR | Status: AC
  Filled 2015-08-10: qty 1

## 2015-08-10 MED ORDER — LEVETIRACETAM 750 MG PO TABS
1500.0000 mg | ORAL_TABLET | Freq: Two times a day (BID) | ORAL | Status: DC
  Administered 2015-08-11 – 2015-08-12 (×3): 1500 mg via ORAL
  Filled 2015-08-10 (×3): qty 2

## 2015-08-10 MED ORDER — METFORMIN HCL 500 MG PO TABS
500.0000 mg | ORAL_TABLET | Freq: Two times a day (BID) | ORAL | Status: DC
  Administered 2015-08-11: 500 mg via ORAL
  Filled 2015-08-10: qty 1

## 2015-08-10 MED ORDER — LACOSAMIDE 50 MG PO TABS: 50.0000 mg | ORAL_TABLET | Freq: Two times a day (BID) | ORAL | Status: DC

## 2015-08-10 MED ORDER — LACOSAMIDE 50 MG PO TABS
50.0000 mg | ORAL_TABLET | Freq: Once | ORAL | Status: DC
  Filled 2015-08-10: qty 1

## 2015-08-10 MED ORDER — ENOXAPARIN SODIUM 40 MG/0.4ML ~~LOC~~ SOLN
40.0000 mg | Freq: Every day | SUBCUTANEOUS | Status: DC
  Administered 2015-08-11 (×2): 40 mg via SUBCUTANEOUS
  Filled 2015-08-10 (×2): qty 0.4

## 2015-08-10 MED ORDER — HYDROCODONE-ACETAMINOPHEN 5-325 MG PO TABS: 1.0000 | ORAL_TABLET | ORAL | Status: DC | PRN

## 2015-08-10 MED ORDER — SODIUM CHLORIDE 0.9 % IV SOLN
1500.0000 mg | Freq: Once | INTRAVENOUS | Status: AC
  Administered 2015-08-10: 1500 mg via INTRAVENOUS
  Filled 2015-08-10: qty 15

## 2015-08-10 MED ORDER — LEVETIRACETAM 750 MG PO TABS: 1500.0000 mg | ORAL_TABLET | Freq: Two times a day (BID) | ORAL | Status: DC

## 2015-08-10 MED ORDER — AMANTADINE HCL 100 MG PO CAPS
100.0000 mg | ORAL_CAPSULE | Freq: Every day | ORAL | Status: DC
  Administered 2015-08-11 – 2015-08-12 (×2): 100 mg via ORAL
  Filled 2015-08-10 (×3): qty 1

## 2015-08-10 MED ORDER — LACOSAMIDE 200 MG/20ML IV SOLN
100.0000 mg | Freq: Two times a day (BID) | INTRAVENOUS | Status: DC
  Administered 2015-08-10 – 2015-08-12 (×4): 100 mg via INTRAVENOUS
  Filled 2015-08-10 (×8): qty 10

## 2015-08-10 MED ORDER — PANTOPRAZOLE SODIUM 40 MG PO TBEC
40.0000 mg | DELAYED_RELEASE_TABLET | Freq: Every day | ORAL | Status: DC
  Administered 2015-08-11 – 2015-08-12 (×2): 40 mg via ORAL
  Filled 2015-08-10 (×2): qty 1

## 2015-08-10 MED ORDER — LEVETIRACETAM 750 MG PO TABS
1500.0000 mg | ORAL_TABLET | Freq: Once | ORAL | Status: DC
  Filled 2015-08-10: qty 2

## 2015-08-10 MED ORDER — SODIUM CHLORIDE 0.9 % IV SOLN
100.0000 mg | Freq: Two times a day (BID) | INTRAVENOUS | Status: DC
  Filled 2015-08-10: qty 10

## 2015-08-10 MED ORDER — LEVOTHYROXINE SODIUM 75 MCG PO TABS
75.0000 ug | ORAL_TABLET | Freq: Every day | ORAL | Status: DC
  Administered 2015-08-11 – 2015-08-12 (×2): 75 ug via ORAL
  Filled 2015-08-10 (×2): qty 1
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
## 2015-08-10 NOTE — ED Notes (Signed)
Patient started to look up in the air and the started jerking his right arm - ?focal seizure

## 2015-08-10 NOTE — Progress Notes (Signed)
Patient arrived from ED A and O to about 2-3 time and situation are fleeting, vitals are stable and no seizure activity reported since last administration of vim pat and IV Kepra his bed rails are padded, will continue to monitor.

## 2015-08-10 NOTE — ED Notes (Signed)
This tech was at bedside placing pt on 12 lead and placing seizure pads when pt became very tachycardic and began having seizure like activity, this tech alerted Carmelina Paddock, RN who retrieved Dr. Jeneen Rinks, both came to bedside. Seizure pads in place and EKG given to and signed by Dr. Jeneen Rinks.
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
## 2015-08-10 NOTE — ED Notes (Signed)
60M nurse unable to take report at this time

## 2015-08-11 DIAGNOSIS — R569 Unspecified convulsions: Secondary | ICD-10-CM | POA: Diagnosis not present

## 2015-08-11 NOTE — Progress Notes (Signed)
[facility] PROGRESS NOTE  DONI KUECHLE [id number] DOB: 19-Mar-1934 DOA: 08/10/2015 PCP: [name], MD  Assessment/Plan: 1. Simple partial versus complex partial seizures- will consult neurology, continue Keppra, Vimpat. 2. Hypothyroidism- continue Synthroid 3. Diabetes mellitus- hold metformin, start sliding scale insulin with NovoLog. 4. DVT prophylaxis- Lovenox  Code Status: Full code Family Communication:  No family present at bedside Disposition Plan: Pending improvement in seizures   Consultants:  Neurology   Procedures:  None   Antibiotics:  none   HPI/Subjective: 82 y.o. male just discharged from our service yesterday (21st) after a stay for grand-mal seizures. During that admit he was also diagnosed with Parkinson's disease as well. He was discharged to [place]; however, today (22nd) NH reports that patient had 5 episodes of partial seizures. Patient isnt so sure about this, he states he thinks they are overcalling and think that his hand tremor (pill rolling tremor from PD) is the seizure.   Patient unable to provide any history regarding seizures. As per ED notes patient had multiple episodes of upper body stiffening and focal shaking of left upper extremity.   Objective: Filed Vitals:   08/11/15 0600 08/11/15 1047  BP: 139/82 119/80  Pulse: 70 73  Temp: 97.6 F (36.4 C) 98.3 F (36.8 C)  Resp: 20 20    Intake/Output Summary (Last 24 hours) at 08/11/15 1539 Last data filed at 08/10/15 2044  Gross per 24 hour  Intake    115 ml  Output      0 ml  Net    115 ml   Filed Weights   08/10/15 2352  Weight: 83.734 kg (184 lb 9.6 oz)    Exam:   General:  Appears pleasantly confused  Cardiovascular: S1s2 RRR  Respiratory: Clear bilaterally  Abdomen: Soft, nontender  Musculoskeletal: No c/c//e  Data Reviewed: Basic Metabolic Panel:  Recent Labs Lab 08/04/15 1803 08/05/15 0459 08/10/15 1820  NA 137 136 135  K 4.2 4.1 4.5   CL 99* 103 101  CO2 25 23 21*  GLUCOSE 159* 122* 142*  BUN 12 7 15   CREATININE 0.77 0.73 0.91  CALCIUM 9.5 8.9 9.4  MG 2.0  --   --    Liver Function Tests:  Recent Labs Lab 08/04/15 1803 08/05/15 0459  AST 22 20  ALT 15* 15*  ALKPHOS 69 65  BILITOT 0.3 0.9  PROT 7.4 6.5  ALBUMIN 4.0 3.4*    CBC:  Recent Labs Lab 08/04/15 1803 08/05/15 0459 08/10/15 1820  WBC 7.4 10.2 8.5  NEUTROABS 4.2 6.7 5.2  HGB 15.3 14.6 16.3  HCT 45.0 43.0 48.1  MCV 85.6 84.8 85.9  PLT 193 177 227   Cardiac Enzymes:  Recent Labs Lab 08/05/15 0459 08/05/15 1027 08/05/15 1553  TROPONINI <0.03 <0.03 <0.03   BNP (last 3 results) No results for input(s): BNP in the last 8760 hours.  ProBNP (last 3 results) No results for input(s): PROBNP in the last 8760 hours.  CBG:  Recent Labs Lab 08/08/15 1206 08/08/15 1653 08/08/15 2113 08/09/15 0652 08/09/15 1145  GLUCAP 106* 136* 114* 129* 127*    No results found for this or any previous visit (from the past 240 hour(s)).   Studies: No results found.  Scheduled Meds: . amantadine  100 mg Oral Daily  . enoxaparin (LOVENOX) injection  40 mg Subcutaneous QHS  . lacosamide (VIMPAT) IV  100 mg Intravenous Q12H  . levETIRAcetam  1,500 mg Oral BID  . levothyroxine  75 mcg Oral QAC  breakfast  . metFORMIN  500 mg Oral BID WC  . pantoprazole  40 mg Oral Daily   Continuous Infusions:   Active Problems:   Seizure ([place])    Time spent: 25 min    [facility] Pager [phone]. If 7PM-7AM, please contact night-coverage at [email or web address], password [email or web address] 08/11/2015, 3:39 PM  LOS: 0 days
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
## 2015-08-11 NOTE — Consult Note (Signed)
Admission H&P    Chief Complaint: Breakthrough seizure activity.  HPI: Gerald Leach is an 82 y.o. male history of hypertension, hyperlipidemia, hypothyroidism, brain aneurysm, diabetes mellitus and recent onset of seizures, admitted on 08/09/2012 for recurrent episodes of nausea seizure activity. Patient had recent onset of grand mal seizures and was discharged from this hospital on 08/09/2015 on Keppra 1500 mg twice a day and Vimpat 50 mg twice a day. He was given a loading dose of Vimpat 200 mg and Vimpat was increased to 100 mg twice a day. No recurrence of seizure activity has been reported. CT scan of his head onto 16 2017 no acute findings. A 2.7 x 1.8 cm mass lesion was seen, consistent with sphenoid ridge meningioma. Chronic atrophy with secondary ventriculomegaly was noted, as well.   Past Medical History  Diagnosis Date  . Hypertension   . Colonic polyp 2003  . GERD (gastroesophageal reflux disease)   . ED (erectile dysfunction)   . Hyperlipidemia   . Hypothyroidism   . Brain aneurysm   . Seizures ([place])     per family  . Seizures ([place])   . Diabetes mellitus without complication [facility])     Past Surgical History  Procedure Laterality Date  . Brain aneurysm surgery      at [facility]  . Carotid artery aneurysm    . Colonoscopy      polyps  . Right hernia    . Craniotomy Right 09/19/2012    Procedure: CRANIOTOMY HEMATOMA EVACUATION SUBDURAL;  Surgeon: Otilio Connors, MD;  Location: [place] [facility];  Service: Neurosurgery;  Laterality: Right;  . Craniotomy Right 09/22/2012    Procedure: CRANIOTOMY HEMATOMA EVACUATION SUBDURAL;  Surgeon: Otilio Connors, MD;  Location: [place] [facility] ORS;  Service: Neurosurgery;  Laterality: Right;  . Craniotomy.    . Aneurysmal clipping      Family History  Problem Relation Age of Onset  . Liver disease Brother     Died  . Seizures Neg Hx    Social History:  reports that he has never smoked. He does not have any smokeless tobacco history on file.  He reports that he drinks alcohol. He reports that he does not use illicit drugs.  Allergies: No Known Allergies  Medications Prior to Admission  Medication Sig Dispense Refill  . amantadine (SYMMETREL) 100 MG capsule Take 1 capsule (100 mg total) by mouth daily. 30 capsule 0  . HYDROcodone-acetaminophen (NORCO/VICODIN) 5-325 MG tablet Take 1 tablet by mouth every 4 (four) hours as needed for moderate pain. 15 tablet 0  . lacosamide (VIMPAT) 50 MG TABS tablet Take 1 tablet (50 mg total) by mouth 2 (two) times daily. 60 tablet 0  . levETIRAcetam (KEPPRA) 750 MG tablet Take 2 tablets (1,500 mg total) by mouth 2 (two) times daily. 360 tablet 3  . levothyroxine (SYNTHROID, LEVOTHROID) 75 MCG tablet Take 1 tablet (75 mcg total) by mouth daily. 90 tablet 1  . metFORMIN (GLUCOPHAGE) 500 MG tablet TAKE 1 TABLET BY MOUTH 2 TIMES DAILY WITH A MEAL. 200 tablet 2  . omeprazole (PRILOSEC) 20 MG capsule TAKE 1 CAPSULE (20 MG TOTAL) BY MOUTH EVERY MONDAY, WEDNESDAY, AND FRIDAY. 100 capsule 2  . levETIRAcetam (KEPPRA) 750 MG tablet Take 2 tablets (1,500 mg total) by mouth 2 (two) times daily. (Patient not taking: Reported on 08/10/2015) 60 tablet 0    ROS: History obtained from chart review and the patient  General ROS: negative for - chills, fatigue, fever, night sweats,  weight gain or weight loss Psychological ROS: negative for - behavioral disorder, hallucinations, memory difficulties, mood swings or suicidal ideation Ophthalmic ROS: negative for - blurry vision, double vision, eye pain or loss of vision ENT ROS: negative for - epistaxis, nasal discharge, oral lesions, sore throat, tinnitus or vertigo Allergy and Immunology ROS: negative for - hives or itchy/watery eyes Hematological and Lymphatic ROS: negative for - bleeding problems, bruising or swollen lymph nodes Endocrine ROS: negative for - galactorrhea, hair pattern changes, polydipsia/polyuria or temperature intolerance Respiratory ROS: negative  for - cough, hemoptysis, shortness of breath or wheezing Cardiovascular ROS: negative for - chest pain, dyspnea on exertion, edema or irregular heartbeat Gastrointestinal ROS: negative for - abdominal pain, diarrhea, hematemesis, nausea/vomiting or stool incontinence Genito-Urinary ROS: negative for - dysuria, hematuria, incontinence or urinary frequency/urgency Musculoskeletal ROS: negative for - joint swelling or muscular weakness Neurological ROS: as noted in HPI Dermatological ROS: negative for rash and skin lesion changes  Physical Examination: Blood pressure 120/79, pulse 70, temperature 98 F (36.7 C), temperature source Oral, resp. rate 20, height 6' (1.829 m), weight 83.734 kg (184 lb 9.6 oz), SpO2 98 %.  HEENT-  Normocephalic, no lesions, without obvious abnormality.  Normal external eye and conjunctiva.  Normal TM's bilaterally.  Normal auditory canals and external ears. Normal external nose, mucus membranes and septum.  Normal pharynx. Neck supple with no masses, nodes, nodules or enlargement. Cardiovascular - regular rate and rhythm, S1, S2 normal, no murmur, click, rub or gallop Lungs - chest clear, no wheezing, rales, normal symmetric air entry Abdomen - soft, non-tender; bowel sounds normal; no masses,  no organomegaly Extremities - no joint deformities, effusion, or inflammation and no edema  Neurologic Examination: Mental Status: Alert, slightly disoriented to correct age, acute distress.  Speech fluent without evidence of aphasia. Able to follow commands without difficulty. Cranial Nerves: II-Visual fields were normal. III/IV/VI-Pupils were equal and reacted. Extraocular movements were full and conjugate.    V/VII-no facial numbness and no facial weakness. VIII-normal. X-normal speech and symmetrical palatal movement. XI: trapezius strength/neck flexion strength normal bilaterally XII-midline tongue extension with normal strength. Motor: 5/5 bilaterally with normal  tone and bulk Sensory: Normal throughout. Deep Tendon Reflexes: 1+ and symmetric. Plantars: Flexor bilaterally Cerebellar: Normal finger-to-nose testing.  Results for orders placed or performed during the hospital encounter of 08/10/15 (from the past 48 hour(s))  Basic metabolic panel     Status: Abnormal   Collection Time: 08/10/15  6:20 PM  Result Value Ref Range   Sodium 135 135 - 145 mmol/L   Potassium 4.5 3.5 - 5.1 mmol/L   Chloride 101 101 - 111 mmol/L   CO2 21 (L) 22 - 32 mmol/L   Glucose, Bld 142 (H) 65 - 99 mg/dL   BUN 15 6 - 20 mg/dL   Creatinine, Ser 0.91 0.61 - 1.24 mg/dL   Calcium 9.4 8.9 - 10.3 mg/dL   GFR calc non Af Amer >60 >60 mL/min   GFR calc Af Amer >60 >60 mL/min    Comment: (NOTE) The eGFR has been calculated using the CKD EPI equation. This calculation has not been validated in all clinical situations. eGFR's persistently <60 mL/min signify possible Chronic Kidney Disease.    Anion gap 13 5 - 15  CBC with Differential     Status: None   Collection Time: 08/10/15  6:20 PM  Result Value Ref Range   WBC 8.5 4.0 - 10.5 K/uL   RBC 5.60 4.22 - 5.81 MIL/uL  Hemoglobin 16.3 13.0 - 17.0 g/dL   HCT 48.1 39.0 - 52.0 %   MCV 85.9 78.0 - 100.0 fL   MCH 29.1 26.0 - 34.0 pg   MCHC 33.9 30.0 - 36.0 g/dL   RDW 13.5 11.5 - 15.5 %   Platelets 227 150 - 400 K/uL   Neutrophils Relative % 61 %   Neutro Abs 5.2 1.7 - 7.7 K/uL   Lymphocytes Relative 28 %   Lymphs Abs 2.3 0.7 - 4.0 K/uL   Monocytes Relative 10 %   Monocytes Absolute 0.8 0.1 - 1.0 K/uL   Eosinophils Relative 1 %   Eosinophils Absolute 0.1 0.0 - 0.7 K/uL   Basophils Relative 0 %   Basophils Absolute 0.0 0.0 - 0.1 K/uL  Urinalysis, Routine w reflex microscopic (not at [facility])     Status: None   Collection Time: 08/10/15  8:46 PM  Result Value Ref Range   Color, Urine YELLOW YELLOW   APPearance CLEAR CLEAR   Specific Gravity, Urine 1.012 1.005 - 1.030   pH 6.0 5.0 - 8.0   Glucose, UA NEGATIVE  NEGATIVE mg/dL   Hgb urine dipstick NEGATIVE NEGATIVE   Bilirubin Urine NEGATIVE NEGATIVE   Ketones, ur NEGATIVE NEGATIVE mg/dL   Protein, ur NEGATIVE NEGATIVE mg/dL   Nitrite NEGATIVE NEGATIVE   Leukocytes, UA NEGATIVE NEGATIVE    Comment: MICROSCOPIC NOT DONE ON URINES WITH NEGATIVE PROTEIN, BLOOD, LEUKOCYTES, NITRITE, OR GLUCOSE <1000 mg/dL.   No results found.  Assessment/Plan 82 year old man with seizure disorder presenting with breakthrough partial seizures, with no report of secondary generalization of seizure activity. Patient has been stable with increased and Vimpat. Dose of Keppra has remained unchanged at 1500 mg twice a day.  Recommendations: No further neurological intervention is indicated acutely at this point. Recommend no changes in current anticonvulsant medications. We will plan to see this patient in follow-up on an as-needed basis following this visit.  C.R. Nicole Kindred, MD [facility]  08/11/2015, 8:29 PM

## 2015-08-11 NOTE — Care Management Note (Signed)
Case Management Note  Patient Details  Name: Gerald Leach MRN: [id number] Date of Birth: January 04, 1934  Subjective/Objective:                    Action/Plan: Patient presented to the ED with seizures.  He was discharged to [facility] on 08/09/15 following an inpatient admission for the same.  CSW is aware that patient is from [facility] and will follow for discharge needs pending PT/OT evals and physician orders.  Expected Discharge Date:                  Expected Discharge Plan:  Skilled Nursing Facility  In-House Referral:  Clinical Social Work  Discharge planning Services     Post Acute Care Choice:    Choice offered to:     DME Arranged:    DME Agency:     HH Arranged:    [place] Agency:     Status of Service:  In process, will continue to follow  Medicare Important Message Given:    Date Medicare IM Given:    Medicare IM give by:    Date Additional Medicare IM Given:    Additional Medicare Important Message give by:     If discussed at [place] of Stay Meetings, dates discussed:    Additional Comments:  Rolm Baptise, RN 08/11/2015, 11:15 AM [phone]

## 2015-08-12 ENCOUNTER — Telehealth: Payer: Self-pay | Admitting: Family Medicine

## 2015-08-12 DIAGNOSIS — E119 Type 2 diabetes mellitus without complications: Secondary | ICD-10-CM | POA: Diagnosis not present

## 2015-08-12 DIAGNOSIS — G20 Parkinson's disease: Secondary | ICD-10-CM | POA: Diagnosis not present

## 2015-08-12 DIAGNOSIS — Z7984 Long term (current) use of oral hypoglycemic drugs: Secondary | ICD-10-CM | POA: Diagnosis not present

## 2015-08-12 DIAGNOSIS — D509 Iron deficiency anemia, unspecified: Secondary | ICD-10-CM | POA: Diagnosis not present

## 2015-08-12 DIAGNOSIS — R569 Unspecified convulsions: Secondary | ICD-10-CM | POA: Diagnosis not present

## 2015-08-12 DIAGNOSIS — R278 Other lack of coordination: Secondary | ICD-10-CM | POA: Diagnosis not present

## 2015-08-12 DIAGNOSIS — Z79899 Other long term (current) drug therapy: Secondary | ICD-10-CM | POA: Diagnosis not present

## 2015-08-12 DIAGNOSIS — K219 Gastro-esophageal reflux disease without esophagitis: Secondary | ICD-10-CM | POA: Diagnosis not present

## 2015-08-12 DIAGNOSIS — E039 Hypothyroidism, unspecified: Secondary | ICD-10-CM | POA: Diagnosis not present

## 2015-08-12 DIAGNOSIS — G40909 Epilepsy, unspecified, not intractable, without status epilepticus: Secondary | ICD-10-CM | POA: Diagnosis not present

## 2015-08-12 DIAGNOSIS — E785 Hyperlipidemia, unspecified: Secondary | ICD-10-CM | POA: Diagnosis not present

## 2015-08-12 DIAGNOSIS — R2689 Other abnormalities of gait and mobility: Secondary | ICD-10-CM | POA: Diagnosis not present

## 2015-08-12 DIAGNOSIS — M6281 Muscle weakness (generalized): Secondary | ICD-10-CM | POA: Diagnosis not present

## 2015-08-12 MED ORDER — LEVETIRACETAM 750 MG PO TABS: 1500.0000 mg | ORAL_TABLET | Freq: Two times a day (BID) | ORAL | Status: DC

## 2015-08-12 MED ORDER — LACOSAMIDE 100 MG PO TABS: 100.0000 mg | ORAL_TABLET | Freq: Two times a day (BID) | ORAL | Status: DC

## 2015-08-12 MED ORDER — HYDROCODONE-ACETAMINOPHEN 5-325 MG PO TABS: 1.0000 | ORAL_TABLET | ORAL | Status: DC | PRN

## 2015-08-12 NOTE — Telephone Encounter (Signed)
[facility] called to schedule pt 2 week hospital follow up. Pt was in for seizures. The last 2 visits were with Tommi Rumps, pt has not seen Dr [name] since 2015. Do you want pt to see Dr [name] for his follow up, or is Tommi Rumps ok? I made appointment for pt with [facility].

## 2015-08-12 NOTE — Clinical Social Work Placement (Signed)
   CLINICAL SOCIAL WORK PLACEMENT  NOTE  Date:  08/12/2015  Patient Details  Name: Gerald Leach MRN: [id number] Date of Birth: 1934-04-02  Clinical Social Work is seeking post-discharge placement for this patient at the [place] level of care (*CSW will initial, date and re-position this form in  chart as items are completed):  Yes   Patient/family provided with [place] Work Department's list of facilities offering this level of care within the geographic area requested by the patient (or if unable, by the patient's family).  Yes   Patient/family informed of their freedom to choose among providers that offer the needed level of care, that participate in Medicare, Medicaid or managed care program needed by the patient, have an available bed and are willing to accept the patient.  Yes   Patient/family informed of [place]'s ownership interest in [facility] and [facility], as well as of the fact that they are under no obligation to receive care at these facilities.  PASRR submitted to EDS on       PASRR number received on       Existing PASRR number confirmed on       FL2 transmitted to all facilities in geographic area requested by pt/family on 08/11/15     FL2 transmitted to all facilities within larger geographic area on       Patient informed that his/her managed care company has contracts with or will negotiate with certain facilities, including the following:        Yes   Patient/family informed of bed offers received.  Patient chooses bed at  [facility])     Physician recommends and patient chooses bed at      Patient to be transferred to  [facility]) on  .  Patient to be transferred to facility by  (son- Anker Kravec)     Patient family notified on 08/12/15 of transfer.  Name of family member notified:   (Pt's son [name])     PHYSICIAN       Additional Comment:     _______________________________________________ Rozell Searing, LCSW 08/12/2015, 2:01 PM

## 2015-08-12 NOTE — Clinical Social Work Note (Signed)
Patient's son, [name] no longer able to transport patient to SNF.   Clinical Social Worker facilitated patient discharge including contacting patient family and facility to confirm patient discharge plans.  Clinical information faxed to facility and family agreeable with plan.  CSW arranged ambulance transport via [facility] to [facility] and [facility].  RN to call report prior to discharge.  Clinical Social Worker will sign off for now as social work intervention is no longer needed. Please consult [place] again if new need arises.  Glendon Axe, MSW, LCSWA [phone] 08/12/2015 3:55 PM

## 2015-08-12 NOTE — Telephone Encounter (Signed)
Okay as long as Georgina Snell is okay with it

## 2015-08-12 NOTE — Telephone Encounter (Signed)
Pt needs hospital follow up appt 2 weeks from today. Pt has seen you the last 2 times. Not seen DrTodd in over 2 yrs. Are you ok with doing hospital follow up? Pt may get switched to you, if you ok with that.

## 2015-08-12 NOTE — Clinical Social Work Note (Signed)
Patient has a bed at [facility] and [place]. BSW intern has made family aware. BSW has also informed facility that patient to d/c to facility once medically stable.   BSW intern remains available.  [place] intern [phone]

## 2015-08-12 NOTE — Clinical Social Work Placement (Signed)
   CLINICAL SOCIAL WORK PLACEMENT  NOTE  Date:  08/12/2015  Patient Details  Name: Gerald SCHUELE MRN: [id number] Date of Birth: 1933-12-03  Clinical Social Work is seeking post-discharge placement for this patient at the [place] level of care (*CSW will initial, date and re-position this form in  chart as items are completed):  Yes   Patient/family provided with [place] Work Department's list of facilities offering this level of care within the geographic area requested by the patient (or if unable, by the patient's family).  Yes   Patient/family informed of their freedom to choose among providers that offer the needed level of care, that participate in Medicare, Medicaid or managed care program needed by the patient, have an available bed and are willing to accept the patient.  Yes   Patient/family informed of [place]'s ownership interest in [facility] and [facility], as well as of the fact that they are under no obligation to receive care at these facilities.  PASRR submitted to EDS on       PASRR number received on       Existing PASRR number confirmed on       FL2 transmitted to all facilities in geographic area requested by pt/family on 08/11/15     FL2 transmitted to all facilities within larger geographic area on       Patient informed that his/her managed care company has contracts with or will negotiate with certain facilities, including the following:        Yes   Patient/family informed of bed offers received.  Patient chooses bed at  [facility] [place])     Physician recommends and patient chooses bed at      Patient to be transferred to  [facility]) on  .  Patient to be transferred to facility by  (son- Posey Eddinger)     Patient family notified on   of transfer.  Name of family member notified:        PHYSICIAN       Additional Comment:     _______________________________________________ Leane Call, Student-SW 08/12/2015, 10:03 AM

## 2015-08-12 NOTE — Telephone Encounter (Signed)
That is fine 
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
## 2015-08-12 NOTE — Care Management Note (Signed)
Case Management Note  Patient Details  Name: RENDELL RABB MRN: [id number] Date of Birth: 18-Jul-1933  Subjective/Objective:                    Action/Plan: Patient discharging to [facility]. No further needs per CM.   Expected Discharge Date:                  Expected Discharge Plan:  Skilled Nursing Facility  In-House Referral:  Clinical Social Work  Discharge planning Services     Post Acute Care Choice:    Choice offered to:     DME Arranged:    DME Agency:     HH Arranged:    [place] Agency:     Status of Service:  Completed, signed off  Medicare Important Message Given:    Date Medicare IM Given:    Medicare IM give by:    Date Additional Medicare IM Given:    Additional Medicare Important Message give by:     If discussed at [place] of Stay Meetings, dates discussed:    Additional Comments:  Pollie Friar, RN 08/12/2015, 2:26 PM

## 2015-08-12 NOTE — Clinical Social Work Note (Signed)
Clinical Social Work Assessment  Patient Details  Name: Gerald Leach MRN: [id number] Date of Birth: June 28, 1933  Date of referral:  08/12/15               Reason for consult:  Facility Placement, Discharge Planning                Permission sought to share information with:  Family Supports Permission granted to share information::  Yes, Verbal Permission Granted  Name::      Gerald Leach)  Agency::   (SNF)  Relationship::   (son- Gerald Mocha)  Sport and exercise psychologist Information:   Gerald [phone])  Housing/Transportation Living arrangements for the past 2 months:  [place] of Information:  Adult Children Patient Interpreter Needed:  None Criminal Activity/Legal Involvement Pertinent to Current Situation/Hospitalization:  No - Comment as needed Significant Relationships:  Adult Children Lives with:  Self Do you feel safe going back to the place where you live?  No Need for family participation in patient care:  Yes (Comment)  Care giving concerns:   Patient's son, Gerald Mocha has not expressed any care giving concerns at this time.   Social Worker assessment / plan:   BSW intern has met with patient and patient's son at bedside to present bed offers. Patient has accepted bed at [facility]. Patient's son made BSW intern aware that admission paperwork had been completed prior and that facility was aware that patient to d/c to facility once medically stable. BSW intern has f/u with Narda Rutherford at [facility] to verify that patient to d/c today. Patient's son has stated that he will be providing transportation for patient. BSW intern remains available.  Employment status:  Retired Forensic scientist:  Medicare PT Recommendations:  [place] / Referral to community resources:  [place]  Patient/Family's Response to care:   Patient's son seemed very supportive of patient. Patient's son was very receptive and appreciated Social Work  intervention from [employer].  Patient/Family's Understanding of and Emotional Response to Diagnosis, Current Treatment, and Prognosis:   Patient's son understood need for further medical care and rehab at [facility].  Emotional Assessment Appearance:  Appears stated age Attitude/Demeanor/Rapport:   (pleasant) Affect (typically observed):  Accepting, Quiet Orientation:  Oriented to Place, Oriented to Self, Oriented to  Time, Oriented to Situation Alcohol / Substance use:  Not Applicable Psych involvement (Current and /or in the community):  No (Comment)  Discharge Needs  Concerns to be addressed:  No discharge needs identified Readmission within the last 30 days:  No Current discharge risk:  None Barriers to Discharge:  No Barriers Identified   Leane Call, Student-SW 08/12/2015, 12:11 PM

## 2015-08-13 LAB — LEVETIRACETAM LEVEL: Levetiracetam Lvl: 64.6 ug/mL — ABNORMAL HIGH (ref 10.0–40.0)

## 2015-08-15 DIAGNOSIS — G20 Parkinson's disease: Secondary | ICD-10-CM | POA: Diagnosis not present

## 2015-08-15 DIAGNOSIS — R569 Unspecified convulsions: Secondary | ICD-10-CM | POA: Diagnosis not present

## 2015-08-15 DIAGNOSIS — E119 Type 2 diabetes mellitus without complications: Secondary | ICD-10-CM | POA: Diagnosis not present

## 2015-08-16 DIAGNOSIS — R569 Unspecified convulsions: Secondary | ICD-10-CM | POA: Diagnosis not present

## 2015-08-16 DIAGNOSIS — D509 Iron deficiency anemia, unspecified: Secondary | ICD-10-CM | POA: Diagnosis not present

## 2015-08-16 DIAGNOSIS — M6281 Muscle weakness (generalized): Secondary | ICD-10-CM | POA: Diagnosis not present

## 2015-08-16 DIAGNOSIS — E039 Hypothyroidism, unspecified: Secondary | ICD-10-CM | POA: Diagnosis not present

## 2015-08-18 DIAGNOSIS — G20 Parkinson's disease: Secondary | ICD-10-CM | POA: Diagnosis not present

## 2015-08-18 DIAGNOSIS — E039 Hypothyroidism, unspecified: Secondary | ICD-10-CM | POA: Diagnosis not present

## 2015-08-18 DIAGNOSIS — R569 Unspecified convulsions: Secondary | ICD-10-CM | POA: Diagnosis not present

## 2015-08-18 DIAGNOSIS — M6281 Muscle weakness (generalized): Secondary | ICD-10-CM | POA: Diagnosis not present

## 2015-08-18 NOTE — Telephone Encounter (Signed)
Pt was scheduled

## 2015-08-24 DIAGNOSIS — E039 Hypothyroidism, unspecified: Secondary | ICD-10-CM | POA: Diagnosis not present

## 2015-08-24 DIAGNOSIS — M6281 Muscle weakness (generalized): Secondary | ICD-10-CM | POA: Diagnosis not present

## 2015-08-24 DIAGNOSIS — G20 Parkinson's disease: Secondary | ICD-10-CM | POA: Diagnosis not present

## 2015-08-24 DIAGNOSIS — R569 Unspecified convulsions: Secondary | ICD-10-CM | POA: Diagnosis not present

## 2015-08-25 ENCOUNTER — Ambulatory Visit: Payer: Self-pay | Admitting: Adult Health

## 2015-08-26 ENCOUNTER — Ambulatory Visit: Payer: Medicare Other | Admitting: Neurology

## 2015-09-06 DIAGNOSIS — E039 Hypothyroidism, unspecified: Secondary | ICD-10-CM | POA: Diagnosis not present

## 2015-09-06 DIAGNOSIS — G20 Parkinson's disease: Secondary | ICD-10-CM | POA: Diagnosis not present

## 2015-09-06 DIAGNOSIS — R569 Unspecified convulsions: Secondary | ICD-10-CM | POA: Diagnosis not present

## 2015-09-12 ENCOUNTER — Telehealth: Payer: Self-pay | Admitting: Family Medicine

## 2015-09-12 ENCOUNTER — Ambulatory Visit: Payer: Medicare Other | Admitting: Neurology

## 2015-09-12 ENCOUNTER — Telehealth: Payer: Self-pay | Admitting: Neurology

## 2015-09-12 DIAGNOSIS — E119 Type 2 diabetes mellitus without complications: Secondary | ICD-10-CM | POA: Diagnosis not present

## 2015-09-12 DIAGNOSIS — R2689 Other abnormalities of gait and mobility: Secondary | ICD-10-CM | POA: Diagnosis not present

## 2015-09-12 DIAGNOSIS — Z9181 History of falling: Secondary | ICD-10-CM | POA: Diagnosis not present

## 2015-09-12 DIAGNOSIS — E039 Hypothyroidism, unspecified: Secondary | ICD-10-CM | POA: Diagnosis not present

## 2015-09-12 DIAGNOSIS — Z7984 Long term (current) use of oral hypoglycemic drugs: Secondary | ICD-10-CM | POA: Diagnosis not present

## 2015-09-12 DIAGNOSIS — G20 Parkinson's disease: Secondary | ICD-10-CM | POA: Diagnosis not present

## 2015-09-12 NOTE — Telephone Encounter (Signed)
Shirlean Mylar was calling needing to get

## 2015-09-12 NOTE — Telephone Encounter (Signed)
Monique, physical therapist with [facility] called requesting verbal orders for pt. 2 X /week for 4 weeks,  for gait, balance training, strengthening, and safe transfer.

## 2015-09-12 NOTE — Telephone Encounter (Signed)
Shirlean Mylar called needing order for PT with diagnosis and PT note. [name] name is Gerald Leach 03/24/1934. Thank you Her number is [phone].

## 2015-09-12 NOTE — Telephone Encounter (Signed)
Pt needs post hospital fup. Pt was discharge from [facility] this past Saturday. Can I create 30 min slot? Pt was given vimpat 100 mg twice a day by dr Maylene Roes and  dr Forde Dandy  Put pt on symmetrel 100 mg one a day. Pt needs new rx send to [facility].

## 2015-09-12 NOTE — Telephone Encounter (Signed)
Pt wife also need to be seen for arthritis on same day

## 2015-09-13 ENCOUNTER — Other Ambulatory Visit: Payer: Self-pay | Admitting: *Deleted

## 2015-09-13 DIAGNOSIS — R4189 Other symptoms and signs involving cognitive functions and awareness: Secondary | ICD-10-CM

## 2015-09-13 DIAGNOSIS — W19XXXA Unspecified fall, initial encounter: Secondary | ICD-10-CM

## 2015-09-13 DIAGNOSIS — M21372 Foot drop, left foot: Secondary | ICD-10-CM

## 2015-09-13 DIAGNOSIS — R269 Unspecified abnormalities of gait and mobility: Secondary | ICD-10-CM

## 2015-09-13 DIAGNOSIS — I671 Cerebral aneurysm, nonruptured: Secondary | ICD-10-CM

## 2015-09-13 DIAGNOSIS — R569 Unspecified convulsions: Secondary | ICD-10-CM

## 2015-09-13 DIAGNOSIS — M5417 Radiculopathy, lumbosacral region: Secondary | ICD-10-CM

## 2015-09-13 NOTE — Telephone Encounter (Signed)
Ok to give verbal orders for below

## 2015-09-13 NOTE — Telephone Encounter (Signed)
Verbal orders given per Tommi Rumps - thanks!

## 2015-09-13 NOTE — Telephone Encounter (Signed)
I spoke with Gerald Leach and she needs order for PT and notes.  All faxed to her this morning.

## 2015-09-13 NOTE — Telephone Encounter (Signed)
I think this patient has transferred to [place]?

## 2015-09-14 DIAGNOSIS — Z7984 Long term (current) use of oral hypoglycemic drugs: Secondary | ICD-10-CM | POA: Diagnosis not present

## 2015-09-14 DIAGNOSIS — G20 Parkinson's disease: Secondary | ICD-10-CM | POA: Diagnosis not present

## 2015-09-14 DIAGNOSIS — R2689 Other abnormalities of gait and mobility: Secondary | ICD-10-CM | POA: Diagnosis not present

## 2015-09-14 DIAGNOSIS — Z9181 History of falling: Secondary | ICD-10-CM | POA: Diagnosis not present

## 2015-09-14 DIAGNOSIS — E119 Type 2 diabetes mellitus without complications: Secondary | ICD-10-CM | POA: Diagnosis not present

## 2015-09-14 DIAGNOSIS — E039 Hypothyroidism, unspecified: Secondary | ICD-10-CM | POA: Diagnosis not present

## 2015-09-14 NOTE — Telephone Encounter (Signed)
They need to establish care

## 2015-09-14 NOTE — Telephone Encounter (Signed)
Appointment has been made

## 2015-09-15 ENCOUNTER — Telehealth: Payer: Self-pay | Admitting: Family Medicine

## 2015-09-15 NOTE — Telephone Encounter (Signed)
Michelle OT from [facility]  would like verbal orders for once a wk for 1 wk and twice a wk for 3 wks and then once a wk for 2wks . Pt will then have OT for once a wk every other week for a total of 10 visits

## 2015-09-16 ENCOUNTER — Telehealth: Payer: Self-pay | Admitting: Neurology

## 2015-09-16 ENCOUNTER — Ambulatory Visit: Payer: Medicare Other | Admitting: Adult Health

## 2015-09-16 ENCOUNTER — Other Ambulatory Visit: Payer: Self-pay | Admitting: *Deleted

## 2015-09-16 ENCOUNTER — Other Ambulatory Visit: Payer: Self-pay | Admitting: Adult Health

## 2015-09-16 MED ORDER — LACOSAMIDE 100 MG PO TABS: 100.0000 mg | ORAL_TABLET | Freq: Two times a day (BID) | ORAL | Status: DC

## 2015-09-16 MED ORDER — GLUCOSE BLOOD VI STRP
ORAL_STRIP | Status: DC
Start: 2015-09-16 — End: 2016-11-15

## 2015-09-16 MED ORDER — ONETOUCH ULTRASOFT LANCETS MISC: Status: DC

## 2015-09-16 MED ORDER — AMANTADINE HCL 100 MG PO CAPS: 100.0000 mg | ORAL_CAPSULE | Freq: Every day | ORAL | Status: DC

## 2015-09-16 NOTE — Telephone Encounter (Signed)
I was looking through your last note and I did not see anything about patient being on Vimpat.  Did you order that?

## 2015-09-16 NOTE — Telephone Encounter (Signed)
Sooner follow-up.

## 2015-09-16 NOTE — Telephone Encounter (Signed)
Per Dr [name] he has not seen the pt and he cannot give orders for this and he suggested to contact the pts neurologist.  I called Sharyn Lull and informed her of this.

## 2015-09-16 NOTE — Telephone Encounter (Signed)
He was started on vimpat due to breakthrough seizure while hospitalized.  Rx has been sent - please fax order for vimpat.  He needs to set up f/u visit with me.  Kynlee Koenigsberg K. Posey Pronto, DO

## 2015-09-16 NOTE — Telephone Encounter (Signed)
See previous note

## 2015-09-16 NOTE — Telephone Encounter (Signed)
Gerald Leach [birth year]. His son [name] called requesting a refill on 2 medications. They are Vimpat 100 mg and Symmetrel 100 mg. Would you please call his son at [phone]. Thank you

## 2015-09-16 NOTE — Telephone Encounter (Signed)
Rx sent.  Patient's son notified.  Patient has a follow up in July.  Does he need to be seen sooner?

## 2015-09-16 NOTE — Telephone Encounter (Signed)
[name] notified that patient's appt has been moved up to April 10 at 11:30.

## 2015-09-19 DIAGNOSIS — G20 Parkinson's disease: Secondary | ICD-10-CM | POA: Diagnosis not present

## 2015-09-19 DIAGNOSIS — Z7984 Long term (current) use of oral hypoglycemic drugs: Secondary | ICD-10-CM | POA: Diagnosis not present

## 2015-09-19 DIAGNOSIS — E119 Type 2 diabetes mellitus without complications: Secondary | ICD-10-CM | POA: Diagnosis not present

## 2015-09-19 DIAGNOSIS — E039 Hypothyroidism, unspecified: Secondary | ICD-10-CM | POA: Diagnosis not present

## 2015-09-19 DIAGNOSIS — R2689 Other abnormalities of gait and mobility: Secondary | ICD-10-CM | POA: Diagnosis not present

## 2015-09-19 DIAGNOSIS — Z9181 History of falling: Secondary | ICD-10-CM | POA: Diagnosis not present

## 2015-09-22 DIAGNOSIS — R2689 Other abnormalities of gait and mobility: Secondary | ICD-10-CM | POA: Diagnosis not present

## 2015-09-22 DIAGNOSIS — E119 Type 2 diabetes mellitus without complications: Secondary | ICD-10-CM | POA: Diagnosis not present

## 2015-09-22 DIAGNOSIS — Z9181 History of falling: Secondary | ICD-10-CM | POA: Diagnosis not present

## 2015-09-22 DIAGNOSIS — Z7984 Long term (current) use of oral hypoglycemic drugs: Secondary | ICD-10-CM | POA: Diagnosis not present

## 2015-09-22 DIAGNOSIS — E039 Hypothyroidism, unspecified: Secondary | ICD-10-CM | POA: Diagnosis not present

## 2015-09-22 DIAGNOSIS — G20 Parkinson's disease: Secondary | ICD-10-CM | POA: Diagnosis not present

## 2015-09-26 ENCOUNTER — Encounter: Payer: Self-pay | Admitting: Neurology

## 2015-09-26 ENCOUNTER — Telehealth: Payer: Self-pay | Admitting: *Deleted

## 2015-09-26 ENCOUNTER — Ambulatory Visit (INDEPENDENT_AMBULATORY_CARE_PROVIDER_SITE_OTHER): Payer: Medicare Other | Admitting: Neurology

## 2015-09-26 VITALS — BP 140/84 | HR 84 | Ht 71.0 in | Wt 180.5 lb

## 2015-09-26 DIAGNOSIS — M21372 Foot drop, left foot: Secondary | ICD-10-CM

## 2015-09-26 DIAGNOSIS — G20 Parkinson's disease: Secondary | ICD-10-CM

## 2015-09-26 DIAGNOSIS — F0390 Unspecified dementia without behavioral disturbance: Secondary | ICD-10-CM | POA: Diagnosis not present

## 2015-09-26 DIAGNOSIS — R569 Unspecified convulsions: Secondary | ICD-10-CM | POA: Diagnosis not present

## 2015-09-26 DIAGNOSIS — G629 Polyneuropathy, unspecified: Secondary | ICD-10-CM

## 2015-09-26 NOTE — Patient Instructions (Signed)
Please call my office in 2 weeks, if you do not hear from [place] about financial resources for Vimpat Return to clinic in 3 months
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
## 2015-09-27 DIAGNOSIS — E039 Hypothyroidism, unspecified: Secondary | ICD-10-CM | POA: Diagnosis not present

## 2015-09-27 DIAGNOSIS — E119 Type 2 diabetes mellitus without complications: Secondary | ICD-10-CM | POA: Diagnosis not present

## 2015-09-27 DIAGNOSIS — Z7984 Long term (current) use of oral hypoglycemic drugs: Secondary | ICD-10-CM | POA: Diagnosis not present

## 2015-09-27 DIAGNOSIS — G20 Parkinson's disease: Secondary | ICD-10-CM | POA: Diagnosis not present

## 2015-09-27 DIAGNOSIS — R2689 Other abnormalities of gait and mobility: Secondary | ICD-10-CM | POA: Diagnosis not present

## 2015-09-27 DIAGNOSIS — Z9181 History of falling: Secondary | ICD-10-CM | POA: Diagnosis not present

## 2015-09-29 ENCOUNTER — Other Ambulatory Visit: Payer: Self-pay | Admitting: *Deleted

## 2015-09-29 DIAGNOSIS — E039 Hypothyroidism, unspecified: Secondary | ICD-10-CM | POA: Diagnosis not present

## 2015-09-29 DIAGNOSIS — Z7984 Long term (current) use of oral hypoglycemic drugs: Secondary | ICD-10-CM | POA: Diagnosis not present

## 2015-09-29 DIAGNOSIS — E119 Type 2 diabetes mellitus without complications: Secondary | ICD-10-CM | POA: Diagnosis not present

## 2015-09-29 DIAGNOSIS — R2689 Other abnormalities of gait and mobility: Secondary | ICD-10-CM | POA: Diagnosis not present

## 2015-09-29 DIAGNOSIS — Z9181 History of falling: Secondary | ICD-10-CM | POA: Diagnosis not present

## 2015-09-29 DIAGNOSIS — G20 Parkinson's disease: Secondary | ICD-10-CM | POA: Diagnosis not present

## 2015-09-29 MED ORDER — LACOSAMIDE 100 MG PO TABS: 100.0000 mg | ORAL_TABLET | Freq: Two times a day (BID) | ORAL | Status: DC

## 2015-10-04 DIAGNOSIS — Z9181 History of falling: Secondary | ICD-10-CM | POA: Diagnosis not present

## 2015-10-04 DIAGNOSIS — R2689 Other abnormalities of gait and mobility: Secondary | ICD-10-CM | POA: Diagnosis not present

## 2015-10-04 DIAGNOSIS — Z7984 Long term (current) use of oral hypoglycemic drugs: Secondary | ICD-10-CM | POA: Diagnosis not present

## 2015-10-04 DIAGNOSIS — E039 Hypothyroidism, unspecified: Secondary | ICD-10-CM | POA: Diagnosis not present

## 2015-10-04 DIAGNOSIS — E119 Type 2 diabetes mellitus without complications: Secondary | ICD-10-CM | POA: Diagnosis not present

## 2015-10-04 DIAGNOSIS — G20 Parkinson's disease: Secondary | ICD-10-CM | POA: Diagnosis not present

## 2015-10-06 DIAGNOSIS — E039 Hypothyroidism, unspecified: Secondary | ICD-10-CM | POA: Diagnosis not present

## 2015-10-06 DIAGNOSIS — Z9181 History of falling: Secondary | ICD-10-CM | POA: Diagnosis not present

## 2015-10-06 DIAGNOSIS — R2689 Other abnormalities of gait and mobility: Secondary | ICD-10-CM | POA: Diagnosis not present

## 2015-10-06 DIAGNOSIS — E119 Type 2 diabetes mellitus without complications: Secondary | ICD-10-CM | POA: Diagnosis not present

## 2015-10-06 DIAGNOSIS — Z7984 Long term (current) use of oral hypoglycemic drugs: Secondary | ICD-10-CM | POA: Diagnosis not present

## 2015-10-06 DIAGNOSIS — G20 Parkinson's disease: Secondary | ICD-10-CM | POA: Diagnosis not present

## 2015-10-11 ENCOUNTER — Telehealth: Payer: Self-pay | Admitting: Family Medicine

## 2015-10-11 DIAGNOSIS — G20 Parkinson's disease: Secondary | ICD-10-CM | POA: Diagnosis not present

## 2015-10-11 DIAGNOSIS — E119 Type 2 diabetes mellitus without complications: Secondary | ICD-10-CM | POA: Diagnosis not present

## 2015-10-11 DIAGNOSIS — E039 Hypothyroidism, unspecified: Secondary | ICD-10-CM | POA: Diagnosis not present

## 2015-10-11 DIAGNOSIS — R2689 Other abnormalities of gait and mobility: Secondary | ICD-10-CM | POA: Diagnosis not present

## 2015-10-11 DIAGNOSIS — Z7984 Long term (current) use of oral hypoglycemic drugs: Secondary | ICD-10-CM | POA: Diagnosis not present

## 2015-10-11 DIAGNOSIS — Z9181 History of falling: Secondary | ICD-10-CM | POA: Diagnosis not present

## 2015-10-11 NOTE — Telephone Encounter (Signed)
See below

## 2015-10-11 NOTE — Telephone Encounter (Signed)
Pt will be discharge from home health physical therapy([facility]) this week and family is requesting outpatient physical therapy for additional therapy such as riding stationary bike etc. Pt will needs order fax to facility of dr todd choice

## 2015-10-12 ENCOUNTER — Other Ambulatory Visit: Payer: Self-pay | Admitting: *Deleted

## 2015-10-12 MED ORDER — LACOSAMIDE 100 MG PO TABS: 1.0000 | ORAL_TABLET | Freq: Two times a day (BID) | ORAL | Status: DC

## 2015-10-13 DIAGNOSIS — Z7984 Long term (current) use of oral hypoglycemic drugs: Secondary | ICD-10-CM | POA: Diagnosis not present

## 2015-10-13 DIAGNOSIS — Z9181 History of falling: Secondary | ICD-10-CM | POA: Diagnosis not present

## 2015-10-13 DIAGNOSIS — R2689 Other abnormalities of gait and mobility: Secondary | ICD-10-CM | POA: Diagnosis not present

## 2015-10-13 DIAGNOSIS — E039 Hypothyroidism, unspecified: Secondary | ICD-10-CM | POA: Diagnosis not present

## 2015-10-13 DIAGNOSIS — E119 Type 2 diabetes mellitus without complications: Secondary | ICD-10-CM | POA: Diagnosis not present

## 2015-10-13 DIAGNOSIS — G20 Parkinson's disease: Secondary | ICD-10-CM | POA: Diagnosis not present

## 2015-10-13 NOTE — Telephone Encounter (Signed)
Left message on machine for son to return our call. 

## 2015-10-17 NOTE — Telephone Encounter (Signed)
Spoke with [name] (son) and he would like for his dad to establish with Georgina Snell.  Please call to schedule. Patient's wife would also like to establish with Georgina Snell - note in chart.  Please call [name] to schedule the appointment. thanks

## 2015-11-02 ENCOUNTER — Other Ambulatory Visit: Payer: Self-pay | Admitting: Neurology

## 2015-11-02 NOTE — Telephone Encounter (Signed)
Rx sent 

## 2015-11-04 NOTE — Telephone Encounter (Signed)
Pt has been sch

## 2015-11-10 ENCOUNTER — Telehealth: Payer: Self-pay | Admitting: Neurology

## 2015-11-10 NOTE — Telephone Encounter (Signed)
Raiquan Callow 06/23/1933. His son Ivo Ackers [id number]. He was calling to see if there is any way [name] can let the [facility] know that he shouldn't be driving. He is due to have them renewed soon. He would like you to please call him. Thank you

## 2015-11-11 NOTE — Telephone Encounter (Signed)
Called Gerald Leach back and instructed him to let the [facility] know that patient has seizures and dementia.  He said that he will be taking his dad and will let them know then.

## 2015-11-22 ENCOUNTER — Telehealth: Payer: Self-pay | Admitting: Neurology

## 2015-11-22 NOTE — Telephone Encounter (Signed)
Gerald Leach called back and left a voicemail - I returned her call and had to leave another voicemail. I called back and stated for her to please fax information on medication flag to [place] since we were unable to connect on the phone.

## 2015-11-22 NOTE — Telephone Encounter (Signed)
VM-Laura with [employer] called in regards to a red flag on a medication and would like a call back at [phone] EXT [id number]/Dawn

## 2015-11-22 NOTE — Telephone Encounter (Signed)
Left message on machine for Gerald Leach to call back.

## 2015-11-28 ENCOUNTER — Telehealth: Payer: Self-pay | Admitting: Neurology

## 2015-11-28 NOTE — Telephone Encounter (Signed)
Gerald Leach 07/08/1933. His son [name] called needing to speak with you. His number is [phone]. Thank you

## 2015-11-28 NOTE — Telephone Encounter (Signed)
Called [name] back and he said that the [facility] gave him a number and address for [place] to contact them about patient's driving.  [address], [address], [place], [place], [address].  [phone].

## 2015-11-30 NOTE — Telephone Encounter (Signed)
Can we send a letter recommending that patient not drive?

## 2015-12-01 NOTE — Telephone Encounter (Signed)
To protect patients health information, I will request that he complete the form that we will mail to him.  This will initiate [employer] to request medical review by his providers and I will complete the necessary paperwork.

## 2015-12-02 NOTE — Telephone Encounter (Signed)
Called Gerald Leach and left message letting him know what we are doing about the DM[employer] form.  Requested for him to call me and let me know if he would like it mailed to him or if he would like to pick it up.

## 2015-12-07 ENCOUNTER — Telehealth: Payer: Self-pay | Admitting: Family Medicine

## 2015-12-07 NOTE — Telephone Encounter (Signed)
Left message for daughter to call back.  

## 2015-12-07 NOTE — Telephone Encounter (Signed)
Placed note on Dr. Anastasio Auerbach desk--he is aware to review before he leaves.

## 2015-12-07 NOTE — Telephone Encounter (Signed)
[facility] - Client [facility] Patient Name: CAYDN CZAPLA DOB: 01-01-1934 Initial Comment father-n-law BS 173, prone to seizures, Nurse Assessment Nurse: Dimas Chyle, RN, Dellis Filbert Date/Time (Eastern Time): 12/07/2015 2:46:58 PM Confirm and document reason for call. If symptomatic, describe symptoms. You must click the next button to save text entered. ---Father-n-law BS 173, prone to seizures, Has the patient traveled out of the country within the last 30 days? ---No Does the patient have any new or worsening symptoms? ---Yes Will a triage be completed? ---Yes Related visit to physician within the last 2 weeks? ---No Does the PT have any chronic conditions? (i.e. diabetes, asthma, etc.) ---Yes List chronic conditions. ---Diabetes type 2, hyperlipidemia, GERD, seizures. Is this a behavioral health or substance abuse call? ---No Guidelines Guideline Title Affirmed Question Affirmed Notes Diabetes - High Blood Sugar Blood glucose 60-240 mg/dl (3.5 -13 mmol/ l) Final Disposition User [facility] [place], RN, Dellis Filbert Disagree/Comply: Comply

## 2015-12-07 NOTE — Telephone Encounter (Signed)
I don't think this level of blood sugar poses a major threat for seizure.  However, he has not had recent diabetes assessment and I recommend that he set this up soon.  We may need to adjust his medications.

## 2015-12-07 NOTE — Telephone Encounter (Signed)
Please show Dr Elease Hashimoto in Dr Gweneth Fritter absents

## 2015-12-08 NOTE — Telephone Encounter (Signed)
Pt daughter is returning autumn call

## 2015-12-08 NOTE — Telephone Encounter (Signed)
Pt has a pending appt with you on 12/14/2015 "f/u diabetes". He is suppose to establish care with you at some point. I let daughter know that at up coming appt we will address Diabetes and then have him follow up to establish care. Just FYI.

## 2015-12-10 ENCOUNTER — Other Ambulatory Visit: Payer: Self-pay | Admitting: Family Medicine

## 2015-12-13 DIAGNOSIS — M2042 Other hammer toe(s) (acquired), left foot: Secondary | ICD-10-CM | POA: Diagnosis not present

## 2015-12-13 DIAGNOSIS — L602 Onychogryphosis: Secondary | ICD-10-CM | POA: Diagnosis not present

## 2015-12-13 DIAGNOSIS — M2041 Other hammer toe(s) (acquired), right foot: Secondary | ICD-10-CM | POA: Diagnosis not present

## 2015-12-13 DIAGNOSIS — E119 Type 2 diabetes mellitus without complications: Secondary | ICD-10-CM | POA: Diagnosis not present

## 2015-12-13 DIAGNOSIS — M216X2 Other acquired deformities of left foot: Secondary | ICD-10-CM | POA: Diagnosis not present

## 2015-12-14 ENCOUNTER — Ambulatory Visit (INDEPENDENT_AMBULATORY_CARE_PROVIDER_SITE_OTHER): Payer: Medicare Other | Admitting: Adult Health

## 2015-12-14 ENCOUNTER — Encounter: Payer: Self-pay | Admitting: Adult Health

## 2015-12-14 VITALS — BP 130/72 | Temp 97.5°F | Ht 71.0 in | Wt 176.5 lb

## 2015-12-14 DIAGNOSIS — E119 Type 2 diabetes mellitus without complications: Secondary | ICD-10-CM

## 2015-12-14 DIAGNOSIS — H9193 Unspecified hearing loss, bilateral: Secondary | ICD-10-CM

## 2015-12-14 LAB — POCT GLYCOSYLATED HEMOGLOBIN (HGB A1C): Hemoglobin A1C: 5.7

## 2015-12-14 NOTE — Progress Notes (Signed)
Subjective:    [name], male    DOB: August 03, 1933, 82 y.o.   MRN: [id number]  HPI  82 year old male who presents to the office today for follow up regarding diabetes. He is currently Metformin 500mg  BID. On 12/07/2015 there was concern due to blood sugars being in the 170's. He reports that over the last 5 days his blood sugars have not been above 140.   He reports feeling well over all.   His daughter who is with him adds that she feels as though his hearing is getting worse. She is inquiring about a hearing exam   Review of Systems  Constitutional: Negative.   HENT: Positive for hearing loss. Negative for ear discharge and ear pain.   Respiratory: Negative.   Cardiovascular: Negative.   Genitourinary: Negative.   Musculoskeletal: Positive for arthralgias and gait problem.  Neurological: Negative.   All other systems reviewed and are negative.  Past Medical History  Diagnosis Date  . Hypertension   . Colonic polyp 2003  . GERD (gastroesophageal reflux disease)   . ED (erectile dysfunction)   . Hyperlipidemia   . Hypothyroidism   . Brain aneurysm   . Seizures ([place])     per family  . Seizures ([place])   . Diabetes mellitus without complication [facility])     Social History   Social History  . Marital Status: Married    Spouse Name: N/A  . Number of Children: N/A  . Years of Education: N/A   Occupational History  . Not on file.   Social History Main Topics  . Smoking status: Never Smoker   . Smokeless tobacco: Not on file  . Alcohol Use: 0.0 oz/week    0 Standard drinks or equivalent per week     Comment: rum mixed in diet coke 3 a week  . Drug Use: No  . Sexual Activity: Not on file   Other Topics Concern  . Not on file   Social History Narrative   ** Merged History Encounter **       Lives with wife in a 2 story home.  Has no trouble with the stairs as long as he holds on to the banister.  Education: college.  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      Past Surgical History  Procedure Laterality Date  . Brain aneurysm surgery      at [facility]  . Carotid artery aneurysm    . Colonoscopy      polyps  . Right hernia    . Craniotomy Right 09/19/2012    Procedure: CRANIOTOMY HEMATOMA EVACUATION SUBDURAL;  Surgeon: Otilio Connors, MD;  Location: [facility];  Service: Neurosurgery;  Laterality: Right;  . Craniotomy Right 09/22/2012    Procedure: CRANIOTOMY HEMATOMA EVACUATION SUBDURAL;  Surgeon: Otilio Connors, MD;  Location: [facility] NEUR[facility] ORS;  Service: Neurosurgery;  Laterality: Right;  . Craniotomy.    . Aneurysmal clipping      Family History  Problem Relation Age of Onset  . Liver disease Brother     Died  . Seizures Neg Hx     No Known Allergies  Current Outpatient Prescriptions on File Prior to Visit  Medication Sig Dispense Refill  . amantadine (SYMMETREL) 100 MG capsule Take 1 capsule (100 mg total) by mouth daily. 90 capsule 3  . divalproex (DEPAKOTE) 500 MG DR tablet TAKE 1 TABLET BY MOUTH 2 TIMES DAILY. 180 tablet  3  . glucose blood (ONETOUCH VERIO) test strip Use as instructed 100 each 12  . HYDROcodone-acetaminophen (NORCO/VICODIN) 5-325 MG tablet Take 1 tablet by mouth every 4 (four) hours as needed for moderate pain. 15 tablet 0  . Lacosamide (VIMPAT) 100 MG TABS Take 1 tablet (100 mg total) by mouth 2 (two) times daily. 28 tablet 0  . Lacosamide 100 MG TABS Take 1 tablet (100 mg total) by mouth 2 (two) times daily. 28 tablet 0  . Lancets (ONETOUCH ULTRASOFT) lancets Use as instructed 100 each 12  . levETIRAcetam (KEPPRA) 750 MG tablet Take 2 tablets (1,500 mg total) by mouth 2 (two) times daily. 360 tablet 3  . levothyroxine (SYNTHROID, LEVOTHROID) 75 MCG tablet TAKE 1 TABLET (75 MCG TOTAL) BY MOUTH DAILY. 90 tablet 0  . metFORMIN (GLUCOPHAGE) 500 MG tablet TAKE 1 TABLET BY MOUTH 2 TIMES DAILY WITH A MEAL. 200 tablet 2  . omeprazole (PRILOSEC) 20 MG capsule TAKE 1 CAPSULE (20 MG TOTAL)  BY MOUTH EVERY MONDAY, WEDNESDAY, AND FRIDAY. 100 capsule 2  . simvastatin (ZOCOR) 10 MG tablet      No current facility-administered medications on file prior to visit.    BP 130/72 mmHg  Temp(Src) 97.5 F (36.4 C) (Oral)  Ht 5\' 11"  (1.803 m)  Wt 176 lb 8 oz (80.06 kg)  BMI 24.63 kg/m2       Objective:   Physical Exam  Constitutional: He is oriented to person, place, and time. He appears well-developed and well-nourished. No distress.  HENT:  Head: Normocephalic and atraumatic.  Right Ear: External ear normal.  Left Ear: External ear normal.  Nose: Nose normal.  Mouth/Throat: Oropharynx is clear and moist. No oropharyngeal exudate.  Trace amount of cerumen in right ear canal.   Cardiovascular: Normal rate, regular rhythm, normal heart sounds and intact distal pulses.  Exam reveals no gallop and no friction rub.   No murmur heard. Pulmonary/Chest: Effort normal and breath sounds normal. No respiratory distress. He has no wheezes. He has no rales. He exhibits no tenderness.  Musculoskeletal: Normal range of motion. He exhibits no edema or tenderness.  Walks with a single prong cane  Neurological: He is alert and oriented to person, place, and time.  Skin: Skin is warm and dry. No rash noted. He is not diaphoretic. No erythema. No pallor.  Psychiatric: He has a normal mood and affect. His behavior is normal. Judgment and thought content normal.  Nursing note and vitals reviewed.     Assessment & Plan:  1. Controlled type 2 diabetes mellitus without complication, without long-term current use of insulin (HCC) - POC HgB A1c - 5.7 - Continue with current dose of Metformin - Follow up with PCP for physical.    2. Hard of hearing, bilateral - Ambulatory referral to Audiology   [name], NP

## 2015-12-14 NOTE — Patient Instructions (Addendum)
It was great seeing you again!  Today your A1c is 5.7  I would recommended that we do not change any of your medications at this time.   Make an appointment for your physical

## 2015-12-22 ENCOUNTER — Other Ambulatory Visit: Payer: Medicare Other

## 2015-12-22 ENCOUNTER — Encounter: Payer: Self-pay | Admitting: Neurology

## 2015-12-22 ENCOUNTER — Ambulatory Visit (INDEPENDENT_AMBULATORY_CARE_PROVIDER_SITE_OTHER): Payer: Medicare Other | Admitting: Neurology

## 2015-12-22 ENCOUNTER — Ambulatory Visit: Payer: Medicare Other | Admitting: Adult Health

## 2015-12-22 VITALS — BP 118/80 | HR 68 | Ht 71.0 in | Wt 177.1 lb

## 2015-12-22 DIAGNOSIS — F0390 Unspecified dementia without behavioral disturbance: Secondary | ICD-10-CM

## 2015-12-22 DIAGNOSIS — R569 Unspecified convulsions: Secondary | ICD-10-CM | POA: Diagnosis not present
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
## 2015-12-22 NOTE — Patient Instructions (Signed)
Continue your current medications Return to clinic in 4 months

## 2015-12-26 ENCOUNTER — Ambulatory Visit: Payer: Medicare Other | Admitting: Neurology

## 2016-01-20 ENCOUNTER — Other Ambulatory Visit: Payer: Self-pay | Admitting: Family Medicine

## 2016-01-23 NOTE — Telephone Encounter (Signed)
Rx refill sent to pharmacy. 

## 2016-02-07 ENCOUNTER — Ambulatory Visit: Payer: Medicare Other | Admitting: Adult Health

## 2016-02-21 ENCOUNTER — Telehealth: Payer: Self-pay | Admitting: Emergency Medicine

## 2016-02-21 NOTE — Telephone Encounter (Signed)
Received forms that needed to be completed per Dr. [name], forms must be completed by neurology. Also pt needs to pick a new PCP and establish care.

## 2016-02-21 NOTE — Telephone Encounter (Signed)
pt

## 2016-02-22 ENCOUNTER — Ambulatory Visit: Payer: Medicare Other | Admitting: Adult Health

## 2016-02-23 ENCOUNTER — Ambulatory Visit (INDEPENDENT_AMBULATORY_CARE_PROVIDER_SITE_OTHER): Payer: Medicare Other | Admitting: Adult Health

## 2016-02-23 ENCOUNTER — Encounter: Payer: Self-pay | Admitting: Adult Health

## 2016-02-23 VITALS — BP 118/68 | Temp 98.6°F | Ht 71.0 in | Wt 158.9 lb

## 2016-02-23 DIAGNOSIS — H6121 Impacted cerumen, right ear: Secondary | ICD-10-CM

## 2016-02-23 NOTE — Progress Notes (Signed)
   Subjective:    [name], male    DOB: May 31, 1934, 82 y.o.   MRN: [id number]  HPI  82 year old male who presents to the office today to have his ears irriagted. He presents with his daughter in law. Per daughter in law, he went to get hearing aids and when they got there, he was told that he needed to have his ears cleaned out before he could be fitted.   He denies any pain, discharge or ringing in his ears.     Review of Systems  Constitutional: Negative.   HENT: Positive for hearing loss. Negative for ear discharge and ear pain.   All other systems reviewed and are negative.      Objective:   Physical Exam  Constitutional: He is oriented to person, place, and time. He appears well-developed and well-nourished. No distress.  HENT:  Head: Normocephalic and atraumatic.  Right Ear: External ear normal. Decreased hearing is noted.  Left Ear: External ear normal. Decreased hearing is noted.  Nose: Nose normal.  Mouth/Throat: Oropharynx is clear and moist. No oropharyngeal exudate.  Tm visualized in left ear.  TM not visualized in right ear, cerumen impaction noted  Neurological: He is alert and oriented to person, place, and time.  Skin: Skin is warm and dry. No rash noted. He is not diaphoretic. No erythema. No pallor.  Psychiatric: He has a normal mood and affect. His behavior is normal. Judgment and thought content normal.  Nursing note and vitals reviewed.      Assessment & Plan:  1. Cerumen impaction, right - Cerumen impaction was easily irrigated with normal saline.  - TM visualized. No signs of infection   [name], NP

## 2016-03-08 ENCOUNTER — Other Ambulatory Visit: Payer: Self-pay | Admitting: Family Medicine

## 2016-03-09 NOTE — Telephone Encounter (Signed)
Established with [facility]

## 2016-03-09 NOTE — Telephone Encounter (Signed)
Ok to refill 

## 2016-03-13 DIAGNOSIS — L602 Onychogryphosis: Secondary | ICD-10-CM | POA: Diagnosis not present

## 2016-03-13 DIAGNOSIS — E1351 Other specified diabetes mellitus with diabetic peripheral angiopathy without gangrene: Secondary | ICD-10-CM | POA: Diagnosis not present

## 2016-03-13 DIAGNOSIS — L84 Corns and callosities: Secondary | ICD-10-CM | POA: Diagnosis not present

## 2016-03-25 ENCOUNTER — Other Ambulatory Visit: Payer: Self-pay | Admitting: Family Medicine

## 2016-03-27 ENCOUNTER — Other Ambulatory Visit: Payer: Self-pay | Admitting: Family Medicine

## 2016-04-03 ENCOUNTER — Telehealth: Payer: Self-pay | Admitting: Neurology

## 2016-04-03 NOTE — Telephone Encounter (Signed)
Attempted to call Gerald Leach back to let her know that I just now faxed the Rx to [facility].  Unable to leave message due to mailbox being full.

## 2016-04-03 NOTE — Telephone Encounter (Signed)
Rx faxed

## 2016-04-03 NOTE — Telephone Encounter (Signed)
[name] is calling to find out the status of the medication refill please call [phone]

## 2016-04-23 ENCOUNTER — Ambulatory Visit (INDEPENDENT_AMBULATORY_CARE_PROVIDER_SITE_OTHER): Payer: Medicare Other | Admitting: Neurology

## 2016-04-23 ENCOUNTER — Encounter: Payer: Self-pay | Admitting: Neurology

## 2016-04-23 VITALS — BP 110/70 | HR 79 | Ht 71.0 in | Wt 178.5 lb

## 2016-04-23 DIAGNOSIS — R569 Unspecified convulsions: Secondary | ICD-10-CM | POA: Diagnosis not present

## 2016-04-23 DIAGNOSIS — R269 Unspecified abnormalities of gait and mobility: Secondary | ICD-10-CM

## 2016-04-23 DIAGNOSIS — F0390 Unspecified dementia without behavioral disturbance: Secondary | ICD-10-CM | POA: Diagnosis not present

## 2016-04-23 DIAGNOSIS — G629 Polyneuropathy, unspecified: Secondary | ICD-10-CM
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
## 2016-04-23 NOTE — Patient Instructions (Addendum)
Continue your current medication  No driving  Recommend exploring assisted living facilities, as we have discussed before  Return to clinic in 6 months

## 2016-05-15 DIAGNOSIS — L84 Corns and callosities: Secondary | ICD-10-CM | POA: Diagnosis not present

## 2016-05-15 DIAGNOSIS — L602 Onychogryphosis: Secondary | ICD-10-CM | POA: Diagnosis not present

## 2016-05-15 DIAGNOSIS — E1351 Other specified diabetes mellitus with diabetic peripheral angiopathy without gangrene: Secondary | ICD-10-CM | POA: Diagnosis not present

## 2016-06-04 ENCOUNTER — Encounter: Payer: Medicare Other | Admitting: Family Medicine

## 2016-06-05 ENCOUNTER — Encounter: Payer: Medicare Other | Admitting: Adult Health

## 2016-06-19 ENCOUNTER — Encounter: Payer: Medicare Other | Admitting: Adult Health

## 2016-06-20 ENCOUNTER — Encounter: Payer: Self-pay | Admitting: Adult Health

## 2016-06-20 ENCOUNTER — Ambulatory Visit (INDEPENDENT_AMBULATORY_CARE_PROVIDER_SITE_OTHER): Payer: Self-pay | Admitting: Adult Health

## 2016-06-20 VITALS — BP 158/72 | Ht 71.0 in | Wt 184.0 lb

## 2016-06-20 DIAGNOSIS — Z23 Encounter for immunization: Secondary | ICD-10-CM

## 2016-06-20 DIAGNOSIS — E032 Hypothyroidism due to medicaments and other exogenous substances: Secondary | ICD-10-CM

## 2016-06-20 DIAGNOSIS — Z0000 Encounter for general adult medical examination without abnormal findings: Secondary | ICD-10-CM

## 2016-06-20 DIAGNOSIS — E119 Type 2 diabetes mellitus without complications: Secondary | ICD-10-CM

## 2016-06-20 LAB — CBC WITH DIFFERENTIAL/PLATELET
BASOS PCT: 0.1 % (ref 0.0–3.0)
Basophils Absolute: 0 10*3/uL (ref 0.0–0.1)
EOS ABS: 0 10*3/uL (ref 0.0–0.7)
EOS PCT: 0.4 % (ref 0.0–5.0)
HCT: 45.4 % (ref 39.0–52.0)
Hemoglobin: 15.4 g/dL (ref 13.0–17.0)
LYMPHS ABS: 1.5 10*3/uL (ref 0.7–4.0)
Lymphocytes Relative: 14.4 % (ref 12.0–46.0)
MCHC: 34 g/dL (ref 30.0–36.0)
MCV: 85.5 fl (ref 78.0–100.0)
MONO ABS: 1 10*3/uL (ref 0.1–1.0)
Monocytes Relative: 9.1 % (ref 3.0–12.0)
NEUTROS ABS: 8 10*3/uL — AB (ref 1.4–7.7)
NEUTROS PCT: 76 % (ref 43.0–77.0)
PLATELETS: 235 10*3/uL (ref 150.0–400.0)
RBC: 5.32 Mil/uL (ref 4.22–5.81)
RDW: 13.8 % (ref 11.5–15.5)
WBC: 10.5 10*3/uL (ref 4.0–10.5)

## 2016-06-20 LAB — BASIC METABOLIC PANEL
BUN: 14 mg/dL (ref 6–23)
CHLORIDE: 99 meq/L (ref 96–112)
CO2: 31 meq/L (ref 19–32)
CREATININE: 0.83 mg/dL (ref 0.40–1.50)
Calcium: 9.2 mg/dL (ref 8.4–10.5)
GFR: 94.59 mL/min (ref >60.00)
GLUCOSE: 115 mg/dL — AB (ref 70–99)
Potassium: 4.5 mEq/L (ref 3.5–5.1)
Sodium: 137 mEq/L (ref 135–145)

## 2016-06-20 LAB — POC URINALSYSI DIPSTICK (AUTOMATED)
Blood, UA: NEGATIVE
Glucose, UA: NEGATIVE
LEUKOCYTES UA: NEGATIVE
NITRITE UA: NEGATIVE
PH UA: 6
Spec Grav, UA: 1.02
UROBILINOGEN UA: 0.2

## 2016-06-20 LAB — LIPID PANEL
Cholesterol: 129 mg/dL (ref 0–200)
HDL: 40.9 mg/dL (ref >39.00)
LDL Cholesterol: 63 mg/dL (ref 0–99)
NONHDL: 88.24
Total CHOL/HDL Ratio: 3
Triglycerides: 124 mg/dL (ref 0.0–149.0)
VLDL: 24.8 mg/dL (ref 0.0–40.0)

## 2016-06-20 LAB — HEPATIC FUNCTION PANEL
ALBUMIN: 4.2 g/dL (ref 3.5–5.2)
ALT: 17 U/L (ref 0–53)
AST: 17 U/L (ref 0–37)
Alkaline Phosphatase: 92 U/L (ref 39–117)
BILIRUBIN TOTAL: 0.6 mg/dL (ref 0.2–1.2)
Bilirubin, Direct: 0.1 mg/dL (ref 0.0–0.3)
Total Protein: 6.8 g/dL (ref 6.0–8.3)

## 2016-06-20 LAB — HEMOGLOBIN A1C: HEMOGLOBIN A1C: 6 % (ref 4.6–6.5)

## 2016-06-20 LAB — TSH: TSH: 5.01 u[IU]/mL — AB (ref 0.35–4.50)

## 2016-06-20 MED ORDER — PNEUMOCOCCAL 13-VAL CONJ VACC IM SUSP: 0.5000 mL | INTRAMUSCULAR | Status: DC

## 2016-06-20 NOTE — Progress Notes (Signed)
Subjective:    [name], male    DOB: Jul 01, 1933, 83 y.o.   MRN: [id number]  HPI  Patient presents for yearly preventative medicine examination. He is a pleasant 83 year old male who  has a past medical history of Brain aneurysm; Colonic polyp (2003); Diabetes mellitus without complication [facility]); ED (erectile dysfunction); GERD (gastroesophageal reflux disease); Hyperlipidemia; Hypertension; Hypothyroidism; Seizures ([place]); and Seizures ([place]).  His daughter in law presents to this visit to help provide history. She feels as though " he is slowing down".  She is helping with medications as well as meals  All immunizations and health maintenance protocols were reviewed with the patient and needed orders were placed. Will give Prevnar 13  Appropriate screening laboratory values were ordered for the patient including screening of hyperlipidemia, renal function and hepatic function.  Medication reconciliation,  past medical history, social history, problem list and allergies were reviewed in detail with the patient  Goals were established with regard to weight loss, exercise, and  diet in compliance with medications. He is using a stationary bike  End of life planning was discussed. He has an advanced directive and living will.   He has been seen by Neurology for seizures and by his dentist. He has not been seen by the eye doctor in many days.   His only acute complaint is that of driving. He feels as though he should be able to drive. He continues to live at home with his wife who has advanced dementia.    Review of Systems  Constitutional: Negative.   Eyes: Negative.   Respiratory: Negative.   Cardiovascular: Negative.   Gastrointestinal: Negative.   Endocrine: Negative.   Genitourinary: Negative.   Musculoskeletal: Negative.   Allergic/Immunologic: Negative.   Neurological: Negative.   Hematological: Negative.   All other systems reviewed and are negative.  Past  Medical History:  Diagnosis Date  . Brain aneurysm   . Colonic polyp 2003  . Diabetes mellitus without complication ([place])   . ED (erectile dysfunction)   . GERD (gastroesophageal reflux disease)   . Hyperlipidemia   . Hypertension   . Hypothyroidism   . Seizures ([place])    per family  . Seizures [facility])     Social History   Social History  . Marital status: Married    Spouse name: N/A  . Number of children: N/A  . Years of education: N/A   Occupational History  . Not on file.   Social History Main Topics  . Smoking status: Never Smoker  . Smokeless tobacco: Never Used  . Alcohol use 0.0 oz/week     Comment: rum mixed in diet coke 3 a week  . Drug use:     Types: Nitrous oxide  . Sexual activity: Not on file   Other Topics Concern  . Not on file   Social History Narrative   ** Merged History Encounter **       Lives with wife in a 2 story home.  Has no trouble with the stairs as long as he holds on to the banister.  Education: college.  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      Past Surgical History:  Procedure Laterality Date  . Aneurysmal clipping    . brain aneurysm surgery     at [facility]  . carotid artery aneurysm    . COLONOSCOPY     polyps  . CRANIOTOMY Right 09/19/2012   Procedure:  CRANIOTOMY HEMATOMA EVACUATION SUBDURAL;  Surgeon: Otilio Connors, MD;  Location: [place] [facility]UR[facility];  Service: Neurosurgery;  Laterality: Right;  . CRANIOTOMY Right 09/22/2012   Procedure: CRANIOTOMY HEMATOMA EVACUATION SUBDURAL;  Surgeon: Otilio Connors, MD;  Location: [place] NEURO ORS;  Service: Neurosurgery;  Laterality: Right;  . Craniotomy.    . right hernia      Family History  Problem Relation Age of Onset  . Liver disease Brother     Died  . Seizures Neg Hx     No Known Allergies  Current Outpatient Prescriptions on File Prior to Visit  Medication Sig Dispense Refill  . amantadine (SYMMETREL) 100 MG capsule Take 1 capsule (100 mg total) by  mouth daily. 90 capsule 3  . glucose blood (ONETOUCH VERIO) test strip Use as instructed 100 each 12  . Lancets (ONETOUCH ULTRASOFT) lancets Use as instructed 100 each 12  . levETIRAcetam (KEPPRA) 750 MG tablet Take 2 tablets (1,500 mg total) by mouth 2 (two) times daily. 360 tablet 3  . levothyroxine (SYNTHROID, LEVOTHROID) 75 MCG tablet TAKE 1 TABLET (75 MCG TOTAL) BY MOUTH DAILY. 90 tablet 0  . metFORMIN (GLUCOPHAGE) 500 MG tablet TAKE 1 TABLET BY MOUTH 2 TIMES DAILY WITH A MEAL. 200 tablet 1  . omeprazole (PRILOSEC) 20 MG capsule TAKE 1 CAPSULE (20 MG TOTAL) BY MOUTH EVERY MONDAY, WEDNESDAY, AND FRIDAY. 100 capsule 1  . simvastatin (ZOCOR) 10 MG tablet TAKE 1 TABLET (10 MG TOTAL) BY MOUTH AT BEDTIME. 100 tablet 1  . VIMPAT 100 MG TABS TAKE 1 TABLET TWICE A DAY 180 tablet 3   No current facility-administered medications on file prior to visit.     BP (!) 158/72   Ht 5\' 11"  (1.803 m)   Wt 184 lb (83.5 kg)   BMI 25.66 kg/m       Objective:   Physical Exam  Constitutional: He is oriented to person, place, and time. He appears well-developed and well-nourished. No distress.  HENT:  Head: Normocephalic and atraumatic.  Right Ear: External ear normal.  Left Ear: External ear normal.  Nose: Nose normal.  Mouth/Throat: Oropharynx is clear and moist. No oropharyngeal exudate.  Eyes: Conjunctivae and EOM are normal. Pupils are equal, round, and reactive to light. Right eye exhibits no discharge. Left eye exhibits no discharge. No scleral icterus.  Neck: Normal range of motion. Neck supple. No tracheal deviation present. No thyromegaly present.  Cardiovascular: Normal rate, regular rhythm, normal heart sounds and intact distal pulses.  Exam reveals no gallop and no friction rub.   No murmur heard. Pulmonary/Chest: Effort normal and breath sounds normal. No stridor. No respiratory distress. He has no wheezes. He has no rales. He exhibits no tenderness.  Abdominal: Soft. Bowel sounds are  normal. He exhibits no distension and no mass. There is no tenderness. There is no rebound and no guarding.  Musculoskeletal: Normal range of motion. He exhibits no edema, tenderness or deformity.  Walks with a single prong cane  Lymphadenopathy:    He has no cervical adenopathy.  Neurological: He is alert and oriented to person, place, and time. He displays no tremor. No cranial nerve deficit or sensory deficit. Coordination normal.  Able to follow simple commands but has trouble with stepped commands  Skin: Skin is warm and dry. No rash noted. He is not diaphoretic. No erythema. No pallor.  Psychiatric: He has a normal mood and affect. His behavior is normal. Judgment and thought content normal.  Nursing note and  vitals reviewed.     Assessment & Plan:  1. Routine general medical examination at a health care facility - Continue to exercise and eat healthy  - Advised brain games to help with memory  - Spoke in length to patient about not being able to drive due to dementia and seizures - Basic metabolic panel - CBC with Differential/Platelet - Hemoglobin A1c - Hepatic function panel - Lipid panel - POCT Urinalysis Dipstick (Automated) - TSH  2. Need for vaccination with 13-polyvalent pneumococcal conjugate vaccine  - Pneumococcal conjugate vaccine 13-valent IM  3. Controlled type 2 diabetes mellitus without complication, without long-term current use of insulin (HCC)  - Basic metabolic panel - CBC with Differential/Platelet - Hemoglobin A1c - Hepatic function panel - Lipid panel - POCT Urinalysis Dipstick (Automated) - TSH - Pneumococcal conjugate vaccine 13-valent IM  4. Hypothyroidism due to non-medication exogenous substances  - Basic metabolic panel - CBC with Differential/Platelet - Hemoglobin A1c - Hepatic function panel - Lipid panel - POCT Urinalysis Dipstick (Automated) - TSH - Pneumococcal conjugate vaccine 13-valent IM - Consider changing synthroid dose.     [name], NP

## 2016-06-20 NOTE — Addendum Note (Signed)
Addended by: Sandria Bales B on: 06/20/2016 02:36 PM   Modules accepted: Orders

## 2016-06-21 ENCOUNTER — Other Ambulatory Visit: Payer: Self-pay | Admitting: Adult Health

## 2016-06-21 LAB — T4, FREE: Free T4: 0.89 ng/dL (ref 0.60–1.60)

## 2016-06-21 LAB — T3, FREE: T3, Free: 3.1 pg/mL (ref 2.3–4.2)

## 2016-06-21 MED ORDER — LEVOTHYROXINE SODIUM 75 MCG PO TABS: ORAL_TABLET | ORAL | 3 refills | Status: DC

## 2016-06-22 ENCOUNTER — Other Ambulatory Visit: Payer: Self-pay

## 2016-06-22 ENCOUNTER — Telehealth: Payer: Self-pay | Admitting: Adult Health

## 2016-06-22 ENCOUNTER — Telehealth: Payer: Self-pay | Admitting: Neurology

## 2016-06-22 MED ORDER — AMANTADINE HCL 100 MG PO CAPS: 100.0000 mg | ORAL_CAPSULE | Freq: Every day | ORAL | 3 refills | Status: DC

## 2016-06-22 MED ORDER — SIMVASTATIN 10 MG PO TABS: ORAL_TABLET | ORAL | 1 refills | Status: DC

## 2016-06-22 MED ORDER — OMEPRAZOLE 20 MG PO CPDR: DELAYED_RELEASE_CAPSULE | ORAL | 1 refills | Status: DC

## 2016-06-22 MED ORDER — METFORMIN HCL 500 MG PO TABS: ORAL_TABLET | ORAL | 1 refills | Status: DC

## 2016-06-22 NOTE — Telephone Encounter (Signed)
[employer] mail order for medication  Fax number [phone] Patient tina daughter in law [phone] called and states that they are changing everything over to the mail order listed above please fax the RX information to [place]

## 2016-06-22 NOTE — Telephone Encounter (Signed)
Pt needs new rxs amantadine 100 mg,metformin 500 mg #180 , simvastatin 10 mg #90, omeprazole 20 mg #90 and before fax levothyroxine 75 mcg rx please confirm with cory concerning mcg.  Pt has tsh labs. Fax to [facility] home delivery [phone]

## 2016-06-22 NOTE — Telephone Encounter (Signed)
It looks like Levothyroxine was sent in to [facility] 06/21/16 - I called and canceled that Rx.  All Rx's have been sent in to [facility]. Thanks!

## 2016-06-27 ENCOUNTER — Other Ambulatory Visit: Payer: Self-pay | Admitting: Family Medicine

## 2016-06-27 NOTE — Telephone Encounter (Signed)
Med list faxed

## 2016-07-09 ENCOUNTER — Telehealth: Payer: Self-pay | Admitting: Neurology

## 2016-07-09 NOTE — Telephone Encounter (Signed)
Patient needs [place] to fax over the list of medication that dr patel gives to patient [employer] mail order fax number [phone]

## 2016-07-10 NOTE — Telephone Encounter (Signed)
Another med list faxed.

## 2016-07-13 ENCOUNTER — Other Ambulatory Visit: Payer: Self-pay | Admitting: *Deleted

## 2016-07-13 DIAGNOSIS — R569 Unspecified convulsions: Secondary | ICD-10-CM

## 2016-07-13 MED ORDER — LACOSAMIDE 100 MG PO TABS: 1.0000 | ORAL_TABLET | Freq: Two times a day (BID) | ORAL | 3 refills | Status: DC

## 2016-07-13 MED ORDER — LEVETIRACETAM 750 MG PO TABS: 1500.0000 mg | ORAL_TABLET | Freq: Two times a day (BID) | ORAL | 3 refills | Status: DC

## 2016-07-24 ENCOUNTER — Other Ambulatory Visit: Payer: Self-pay | Admitting: Family Medicine

## 2016-07-30 ENCOUNTER — Telehealth: Payer: Self-pay | Admitting: Neurology

## 2016-07-30 NOTE — Telephone Encounter (Signed)
PT called and said he had a new pharmacy to fax his prescriptions to/[facility]#[phone]

## 2016-08-03 NOTE — Telephone Encounter (Signed)
Prescription(s) have been sent

## 2016-08-22 ENCOUNTER — Telehealth: Payer: Self-pay | Admitting: Neurology

## 2016-08-22 NOTE — Telephone Encounter (Signed)
Gerald Leach (daughter in law) Her  # [phone] . She is needing a letter faxed regarding his Seizure medication Vimpat 100 MG. The  Letter needs to be  faxed so that [facility] can send it to the home. [employer]'s fax # is [phone]. Thank you

## 2016-08-27 ENCOUNTER — Telehealth: Payer: Self-pay | Admitting: Adult Health

## 2016-08-27 NOTE — Telephone Encounter (Signed)
Pt need new Rx levothyroxine  Pharm:  [facility].

## 2016-08-28 ENCOUNTER — Other Ambulatory Visit: Payer: Self-pay

## 2016-08-28 ENCOUNTER — Telehealth: Payer: Self-pay | Admitting: Neurology

## 2016-08-28 DIAGNOSIS — L84 Corns and callosities: Secondary | ICD-10-CM | POA: Diagnosis not present

## 2016-08-28 DIAGNOSIS — B351 Tinea unguium: Secondary | ICD-10-CM | POA: Diagnosis not present

## 2016-08-28 DIAGNOSIS — M79672 Pain in left foot: Secondary | ICD-10-CM | POA: Diagnosis not present

## 2016-08-28 DIAGNOSIS — L602 Onychogryphosis: Secondary | ICD-10-CM | POA: Diagnosis not present

## 2016-08-28 DIAGNOSIS — M79671 Pain in right foot: Secondary | ICD-10-CM | POA: Diagnosis not present

## 2016-08-28 DIAGNOSIS — E1351 Other specified diabetes mellitus with diabetic peripheral angiopathy without gangrene: Secondary | ICD-10-CM | POA: Diagnosis not present

## 2016-08-28 NOTE — Telephone Encounter (Signed)
See note to about Rx.

## 2016-08-28 NOTE — Telephone Encounter (Signed)
I spoke with [name] and informed him that I just spoke with Gene (pharmacist with [facility]) and his dad will be getting Vimpat through mail order from now on.

## 2016-08-28 NOTE — Telephone Encounter (Signed)
PT's son [name] wanted a call back regarding medication/[facility] CB# [phone]

## 2016-08-31 ENCOUNTER — Other Ambulatory Visit: Payer: Self-pay

## 2016-08-31 MED ORDER — LEVOTHYROXINE SODIUM 75 MCG PO TABS: ORAL_TABLET | ORAL | 1 refills | Status: DC

## 2016-08-31 NOTE — Telephone Encounter (Signed)
Daughter in law calling to follow up on rx request.

## 2016-08-31 NOTE — Telephone Encounter (Signed)
I left detailed message for patient's daughter explaining that I called in Rx to [facility] home delivery.

## 2016-09-20 DIAGNOSIS — G40909 Epilepsy, unspecified, not intractable, without status epilepticus: Secondary | ICD-10-CM | POA: Diagnosis not present

## 2016-09-20 DIAGNOSIS — E7800 Pure hypercholesterolemia, unspecified: Secondary | ICD-10-CM | POA: Diagnosis not present

## 2016-09-20 DIAGNOSIS — Z6826 Body mass index (BMI) 26.0-26.9, adult: Secondary | ICD-10-CM | POA: Diagnosis not present

## 2016-09-20 DIAGNOSIS — E039 Hypothyroidism, unspecified: Secondary | ICD-10-CM | POA: Diagnosis not present

## 2016-09-20 DIAGNOSIS — Z7984 Long term (current) use of oral hypoglycemic drugs: Secondary | ICD-10-CM | POA: Diagnosis not present

## 2016-09-20 DIAGNOSIS — E1142 Type 2 diabetes mellitus with diabetic polyneuropathy: Secondary | ICD-10-CM | POA: Diagnosis not present

## 2016-09-20 DIAGNOSIS — Z0000 Encounter for general adult medical examination without abnormal findings: Secondary | ICD-10-CM | POA: Diagnosis not present

## 2016-09-20 DIAGNOSIS — H9113 Presbycusis, bilateral: Secondary | ICD-10-CM | POA: Diagnosis not present

## 2016-09-20 DIAGNOSIS — K219 Gastro-esophageal reflux disease without esophagitis: Secondary | ICD-10-CM | POA: Diagnosis not present

## 2016-09-20 DIAGNOSIS — Z79899 Other long term (current) drug therapy: Secondary | ICD-10-CM | POA: Diagnosis not present

## 2016-09-21 ENCOUNTER — Other Ambulatory Visit: Payer: Self-pay | Admitting: Neurology

## 2016-09-25 ENCOUNTER — Other Ambulatory Visit: Payer: Self-pay | Admitting: Adult Health

## 2016-09-26 ENCOUNTER — Other Ambulatory Visit: Payer: Self-pay | Admitting: Family Medicine

## 2016-10-24 ENCOUNTER — Ambulatory Visit (INDEPENDENT_AMBULATORY_CARE_PROVIDER_SITE_OTHER): Payer: Medicare HMO | Admitting: Neurology

## 2016-10-24 ENCOUNTER — Encounter: Payer: Self-pay | Admitting: Neurology

## 2016-10-24 VITALS — BP 110/78 | HR 80 | Wt 186.1 lb

## 2016-10-24 DIAGNOSIS — R269 Unspecified abnormalities of gait and mobility: Secondary | ICD-10-CM

## 2016-10-24 DIAGNOSIS — F0390 Unspecified dementia without behavioral disturbance: Secondary | ICD-10-CM | POA: Diagnosis not present

## 2016-10-24 DIAGNOSIS — R69 Illness, unspecified: Secondary | ICD-10-CM | POA: Diagnosis not present

## 2016-10-24 DIAGNOSIS — R569 Unspecified convulsions: Secondary | ICD-10-CM | POA: Diagnosis not present
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
## 2016-10-24 NOTE — Patient Instructions (Addendum)
1.  Continue keppra 1500mg  twice daily 2.  Continue vimpat 100mg  twice daily 3.  Continue amantadine 100mg  daily 4.  Encouraged to use walker 5.  Recommend that you look into assisted living facilities/nursing facilities  Return to clinic in 6 months

## 2016-10-30 ENCOUNTER — Other Ambulatory Visit: Payer: Self-pay | Admitting: Neurology

## 2016-10-30 NOTE — Telephone Encounter (Signed)
Rx sent 

## 2016-11-02 ENCOUNTER — Telehealth: Payer: Self-pay | Admitting: Adult Health

## 2016-11-02 NOTE — Telephone Encounter (Signed)
° ° ° ° ° °  Pt will run out of the below med before he received his mail order and is asking if about 30 pills can be called in until he receives.   [facility] [address]

## 2016-11-05 NOTE — Telephone Encounter (Signed)
Which medications?

## 2016-11-15 ENCOUNTER — Telehealth: Payer: Self-pay | Admitting: Adult Health

## 2016-11-15 ENCOUNTER — Other Ambulatory Visit: Payer: Self-pay

## 2016-11-15 MED ORDER — GLUCOSE BLOOD VI STRP: ORAL_STRIP | 12 refills | Status: DC

## 2016-11-15 NOTE — Telephone Encounter (Signed)
Rx has been refilled as directed.

## 2016-11-15 NOTE — Telephone Encounter (Signed)
Pt needs test strips #90 for verio flex meter  send to [facility]

## 2016-11-19 ENCOUNTER — Other Ambulatory Visit: Payer: Self-pay | Admitting: *Deleted

## 2016-11-19 MED ORDER — GLUCOSE BLOOD VI STRP: ORAL_STRIP | 12 refills | Status: DC

## 2016-11-20 DIAGNOSIS — R69 Illness, unspecified: Secondary | ICD-10-CM | POA: Diagnosis not present

## 2016-11-27 DIAGNOSIS — B351 Tinea unguium: Secondary | ICD-10-CM | POA: Diagnosis not present

## 2016-11-27 DIAGNOSIS — L84 Corns and callosities: Secondary | ICD-10-CM | POA: Diagnosis not present

## 2016-11-27 DIAGNOSIS — M79672 Pain in left foot: Secondary | ICD-10-CM | POA: Diagnosis not present

## 2016-11-27 DIAGNOSIS — E1351 Other specified diabetes mellitus with diabetic peripheral angiopathy without gangrene: Secondary | ICD-10-CM | POA: Diagnosis not present

## 2016-11-27 DIAGNOSIS — M79671 Pain in right foot: Secondary | ICD-10-CM | POA: Diagnosis not present

## 2017-01-16 ENCOUNTER — Other Ambulatory Visit: Payer: Self-pay | Admitting: Adult Health

## 2017-01-16 NOTE — Telephone Encounter (Signed)
Gerald Leach, pt had lab work and visit on 06/20/16.  Nothing documented on the labs.  Do you need to repeat TSH?

## 2017-01-17 NOTE — Telephone Encounter (Signed)
Ok to refill for 6 months 

## 2017-01-17 NOTE — Telephone Encounter (Signed)
Sent to the pharmacy by e-scribe per Cory's instructions.

## 2017-01-18 DIAGNOSIS — R69 Illness, unspecified: Secondary | ICD-10-CM | POA: Diagnosis not present

## 2017-02-12 DIAGNOSIS — L84 Corns and callosities: Secondary | ICD-10-CM | POA: Diagnosis not present

## 2017-02-12 DIAGNOSIS — E1351 Other specified diabetes mellitus with diabetic peripheral angiopathy without gangrene: Secondary | ICD-10-CM | POA: Diagnosis not present

## 2017-02-12 DIAGNOSIS — L03032 Cellulitis of left toe: Secondary | ICD-10-CM | POA: Diagnosis not present

## 2017-02-12 DIAGNOSIS — M2041 Other hammer toe(s) (acquired), right foot: Secondary | ICD-10-CM | POA: Diagnosis not present

## 2017-02-12 DIAGNOSIS — B351 Tinea unguium: Secondary | ICD-10-CM | POA: Diagnosis not present

## 2017-02-12 DIAGNOSIS — M2042 Other hammer toe(s) (acquired), left foot: Secondary | ICD-10-CM | POA: Diagnosis not present

## 2017-02-12 DIAGNOSIS — L97529 Non-pressure chronic ulcer of other part of left foot with unspecified severity: Secondary | ICD-10-CM | POA: Diagnosis not present

## 2017-02-19 DIAGNOSIS — G609 Hereditary and idiopathic neuropathy, unspecified: Secondary | ICD-10-CM | POA: Diagnosis not present

## 2017-02-19 DIAGNOSIS — L97529 Non-pressure chronic ulcer of other part of left foot with unspecified severity: Secondary | ICD-10-CM | POA: Diagnosis not present

## 2017-03-07 DIAGNOSIS — L97529 Non-pressure chronic ulcer of other part of left foot with unspecified severity: Secondary | ICD-10-CM | POA: Diagnosis not present

## 2017-03-07 DIAGNOSIS — M2042 Other hammer toe(s) (acquired), left foot: Secondary | ICD-10-CM | POA: Diagnosis not present

## 2017-03-07 DIAGNOSIS — E1351 Other specified diabetes mellitus with diabetic peripheral angiopathy without gangrene: Secondary | ICD-10-CM | POA: Diagnosis not present

## 2017-03-08 ENCOUNTER — Encounter: Payer: Self-pay | Admitting: Adult Health

## 2017-03-19 ENCOUNTER — Emergency Department (HOSPITAL_COMMUNITY): Payer: Medicare HMO

## 2017-03-19 ENCOUNTER — Emergency Department (HOSPITAL_COMMUNITY)
Admission: EM | Admit: 2017-03-19 | Discharge: 2017-03-20 | Disposition: A | Discharge status: Home / Self Care | Payer: Medicare HMO | Attending: Emergency Medicine | Admitting: Emergency Medicine

## 2017-03-19 ENCOUNTER — Encounter (HOSPITAL_COMMUNITY): Payer: Self-pay

## 2017-03-19 DIAGNOSIS — E119 Type 2 diabetes mellitus without complications: Secondary | ICD-10-CM | POA: Insufficient documentation

## 2017-03-19 DIAGNOSIS — I10 Essential (primary) hypertension: Secondary | ICD-10-CM | POA: Diagnosis not present

## 2017-03-19 DIAGNOSIS — G40909 Epilepsy, unspecified, not intractable, without status epilepticus: Secondary | ICD-10-CM | POA: Diagnosis not present

## 2017-03-19 DIAGNOSIS — S0990XA Unspecified injury of head, initial encounter: Secondary | ICD-10-CM | POA: Diagnosis not present

## 2017-03-19 DIAGNOSIS — S199XXA Unspecified injury of neck, initial encounter: Secondary | ICD-10-CM | POA: Diagnosis not present

## 2017-03-19 DIAGNOSIS — Z79899 Other long term (current) drug therapy: Secondary | ICD-10-CM | POA: Diagnosis not present

## 2017-03-19 DIAGNOSIS — R569 Unspecified convulsions: Secondary | ICD-10-CM | POA: Diagnosis present

## 2017-03-19 DIAGNOSIS — R9431 Abnormal electrocardiogram [ECG] [EKG]: Secondary | ICD-10-CM | POA: Diagnosis not present

## 2017-03-19 DIAGNOSIS — Z7984 Long term (current) use of oral hypoglycemic drugs: Secondary | ICD-10-CM | POA: Diagnosis not present

## 2017-03-19 LAB — CBG MONITORING, ED: GLUCOSE-CAPILLARY: 122 mg/dL — AB (ref 65–99)
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
## 2017-03-19 NOTE — ED Notes (Signed)
EKG given to EDP,Plunkett,MD., for review. 

## 2017-03-19 NOTE — ED Notes (Signed)
Patient transported to CT 
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
## 2017-03-19 NOTE — Discharge Instructions (Signed)
Continue taking your Keppra and Vimpat as currently prescribed. Call your neurologist to see if she wants to adjust your medication. Otherwise, keep your regularly scheduled follow-up appointment with her.

## 2017-03-21 ENCOUNTER — Telehealth: Payer: Self-pay | Admitting: Neurology

## 2017-03-21 DIAGNOSIS — L97529 Non-pressure chronic ulcer of other part of left foot with unspecified severity: Secondary | ICD-10-CM | POA: Diagnosis not present

## 2017-03-21 DIAGNOSIS — M2042 Other hammer toe(s) (acquired), left foot: Secondary | ICD-10-CM | POA: Diagnosis not present

## 2017-03-21 DIAGNOSIS — E1351 Other specified diabetes mellitus with diabetic peripheral angiopathy without gangrene: Secondary | ICD-10-CM | POA: Diagnosis not present

## 2017-03-21 NOTE — Telephone Encounter (Signed)
Left message on machine for patient's son to call back.   

## 2017-03-21 NOTE — Telephone Encounter (Signed)
Left message on machine for patient to call back.

## 2017-03-21 NOTE — Telephone Encounter (Signed)
Spoke with patient's son. Aware we have access to hospital records from 03/19/17 where patient had seizure. His CT looked okay. He states no further complications since this time. No further seizures. No headaches, etc. Patient is doing well.   Advised him to stay on current treatment and keep follow up on 04/26/17 with [name], but I would forward information to her and call back if any changes need made prior to appt. He agrees with this plan.

## 2017-03-21 NOTE — Telephone Encounter (Signed)
Patient's son called regarding a fall that resulted from a Seizure night before last. His last Seizure that they know of was February 2017. He has a follow up scheduled for November. Please Advise. Thanks

## 2017-03-21 NOTE — Telephone Encounter (Signed)
[name] returning your call. Thanks

## 2017-03-26 ENCOUNTER — Other Ambulatory Visit (HOSPITAL_COMMUNITY): Payer: Self-pay | Admitting: Podiatry

## 2017-03-26 DIAGNOSIS — I739 Peripheral vascular disease, unspecified: Secondary | ICD-10-CM

## 2017-03-27 ENCOUNTER — Other Ambulatory Visit: Payer: Self-pay | Admitting: Adult Health

## 2017-03-27 NOTE — Telephone Encounter (Signed)
Sent to the pharmacy by e-scribe for 90 days.  Pt due for yearly 06/2017.

## 2017-04-05 ENCOUNTER — Inpatient Hospital Stay (HOSPITAL_COMMUNITY): Admission: RE | Admit: 2017-04-05 | Payer: Medicare HMO | Source: Ambulatory Visit

## 2017-04-17 DIAGNOSIS — B351 Tinea unguium: Secondary | ICD-10-CM | POA: Diagnosis not present

## 2017-04-17 DIAGNOSIS — E1151 Type 2 diabetes mellitus with diabetic peripheral angiopathy without gangrene: Secondary | ICD-10-CM | POA: Diagnosis not present

## 2017-04-17 DIAGNOSIS — M79672 Pain in left foot: Secondary | ICD-10-CM | POA: Diagnosis not present

## 2017-04-17 DIAGNOSIS — L97511 Non-pressure chronic ulcer of other part of right foot limited to breakdown of skin: Secondary | ICD-10-CM | POA: Diagnosis not present

## 2017-04-17 DIAGNOSIS — L84 Corns and callosities: Secondary | ICD-10-CM | POA: Diagnosis not present

## 2017-04-17 DIAGNOSIS — L97529 Non-pressure chronic ulcer of other part of left foot with unspecified severity: Secondary | ICD-10-CM | POA: Diagnosis not present

## 2017-04-18 ENCOUNTER — Telehealth: Payer: Self-pay | Admitting: Adult Health

## 2017-04-18 MED ORDER — METFORMIN HCL 500 MG PO TABS: ORAL_TABLET | ORAL | 0 refills | Status: DC

## 2017-04-18 NOTE — Telephone Encounter (Signed)
Sent to the pharmacy by e-scribe. 

## 2017-04-18 NOTE — Telephone Encounter (Signed)
Ok to fill for 3 months?

## 2017-04-18 NOTE — Telephone Encounter (Signed)
Last seen in Jan for yearly.  Please advise.

## 2017-04-18 NOTE — Telephone Encounter (Signed)
Pt need new Rx for metfromin  Pharm:  [facility]  [phone]

## 2017-04-19 ENCOUNTER — Other Ambulatory Visit: Payer: Self-pay | Admitting: Cardiovascular Disease

## 2017-04-19 DIAGNOSIS — I739 Peripheral vascular disease, unspecified: Secondary | ICD-10-CM

## 2017-04-23 ENCOUNTER — Ambulatory Visit (HOSPITAL_COMMUNITY)
Admission: RE | Admit: 2017-04-23 | Discharge: 2017-04-23 | Disposition: A | Discharge status: Home / Self Care | Payer: Medicare HMO | Source: Ambulatory Visit | Attending: Cardiovascular Disease | Admitting: Cardiovascular Disease

## 2017-04-23 ENCOUNTER — Encounter: Payer: Self-pay | Admitting: Cardiovascular Disease

## 2017-04-23 ENCOUNTER — Ambulatory Visit: Payer: Medicare HMO | Admitting: Cardiovascular Disease

## 2017-04-23 DIAGNOSIS — E08621 Diabetes mellitus due to underlying condition with foot ulcer: Secondary | ICD-10-CM

## 2017-04-23 DIAGNOSIS — I739 Peripheral vascular disease, unspecified: Secondary | ICD-10-CM | POA: Diagnosis not present

## 2017-04-23 DIAGNOSIS — L97509 Non-pressure chronic ulcer of other part of unspecified foot with unspecified severity: Secondary | ICD-10-CM

## 2017-04-23 DIAGNOSIS — L97401 Non-pressure chronic ulcer of unspecified heel and midfoot limited to breakdown of skin: Secondary | ICD-10-CM

## 2017-04-23 DIAGNOSIS — E783 Hyperchylomicronemia: Secondary | ICD-10-CM

## 2017-04-23 DIAGNOSIS — E11621 Type 2 diabetes mellitus with foot ulcer: Secondary | ICD-10-CM | POA: Insufficient documentation

## 2017-04-23 NOTE — Patient Instructions (Signed)
Medication Instructions: Your physician recommends that you continue on your current medications as directed. Please refer to the Current Medication list given to you today.   Follow-Up: Your physician recommends that you schedule a follow-up appointment as needed with Dr. Berry.    

## 2017-04-23 NOTE — Assessment & Plan Note (Signed)
History of hyperlipidemia on statin therapy with recent lipid profile performed 06/20/16 revealing total is 129, LDL 63 and HDL of 40

## 2017-04-23 NOTE — Progress Notes (Signed)
04/23/2017 JRU PENSE   March 22, 1934  [id number]  Primary Physician Gerald Peng, NP Primary Cardiologist: Lorretta Harp MD Gerald Leach, [place]  HPI:  Gerald Leach is a 83 y.o. male married, father of 50, grandfather and 3 grandchildren is accompanied by his daughter Gerald Leach today. He was referred by Dr. Fritzi Leach , his podiatrist, for possible DOS for evaluation because of nonhealing toe ulcers. He has a history to hyperlipidemia and diabetes. He's never had a heart attack or stroke. He does have some Parkinson's symptoms as well as seizures. Never had a heart attack or stroke. He denies chest pain or shortness of breath. He is never smoked. He said nonhealing ulcers on the tips of his left second and third toe and right second toe. Dopplers performed in our office today showed normal ABIs and TBI's bilaterally triphasic waveforms and no evidence of obstructive disease. He does have palpable pedal pulses on exam.   Current Meds  Medication Sig  . amantadine (SYMMETREL) 100 MG capsule Take 1 capsule (100 mg total) by mouth daily.  . ferrous sulfate 325 (65 FE) MG tablet Take 325 mg by mouth at bedtime.  Marland Kitchen glucose blood (ONETOUCH VERIO) test strip Use once daily Dx E11.9  . Lancets (ONETOUCH ULTRASOFT) lancets Use as instructed  . levETIRAcetam (KEPPRA) 750 MG tablet Take 2 tablets (1,500 mg total) by mouth 2 (two) times daily.  Marland Kitchen levothyroxine (SYNTHROID, LEVOTHROID) 75 MCG tablet TAKE 1 TABLET (75 MCG TOTAL) BY MOUTH DAILY.  . metFORMIN (GLUCOPHAGE) 500 MG tablet TAKE 1 TABLET BY MOUTH 2 TIMES DAILY WITH A MEAL.  . Multiple Vitamins-Minerals (CENTRUM SILVER 50+MEN PO) Take 1 tablet by mouth every morning.  Marland Kitchen omeprazole (PRILOSEC) 20 MG capsule TAKE 1 CAPSULE (20 MG TOTAL) BY MOUTH EVERY MONDAY, WEDNESDAY, AND FRIDAY.  . simvastatin (ZOCOR) 10 MG tablet TAKE 1 TABLET (10 MG TOTAL) BY MOUTH AT BEDTIME.  . simvastatin (ZOCOR) 10 MG tablet TAKE 1 TABLET (10 MG TOTAL) BY MOUTH AT  BEDTIME.  Marland Kitchen VIMPAT 100 MG TABS TAKE 1 TABLET TWICE A DAY  . vitamin B-12 (CYANOCOBALAMIN) 1000 MCG tablet Take 2,000 mcg by mouth at bedtime.     No Known Allergies  Social History   Socioeconomic History  . Marital status: Married    Spouse name: Not on file  . Number of children: 2  . Years of education: Not on file  . Highest education level: Not on file  Social Needs  . Financial resource strain: Not on file  . Food insecurity - worry: Not on file  . Food insecurity - inability: Not on file  . Transportation needs - medical: Not on file  . Transportation needs - non-medical: Not on file  Occupational History  . Not on file  Tobacco Use  . Smoking status: Never Smoker  . Smokeless tobacco: Never Used  Substance and Sexual Activity  . Alcohol use: Yes    Alcohol/week: 0.0 oz    Comment: rum mixed in diet coke 3 a week  . Drug use: Yes    Types: Nitrous oxide  . Sexual activity: Not on file  Other Topics Concern  . Not on file  Social History Narrative   ** Merged History Encounter **       Lives with wife in a 2 story home.  Has no trouble with the stairs as long as he holds on to the banister.  Education: college.  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       Review of Systems: General: negative for chills, fever, night sweats or weight changes.  Cardiovascular: negative for chest pain, dyspnea on exertion, edema, orthopnea, palpitations, paroxysmal nocturnal dyspnea or shortness of breath Dermatological: negative for rash Respiratory: negative for cough or wheezing Urologic: negative for hematuria Abdominal: negative for nausea, vomiting, diarrhea, bright red blood per rectum, melena, or hematemesis Neurologic: negative for visual changes, syncope, or dizziness All other systems reviewed and are otherwise negative except as noted above.    Blood pressure 138/81, pulse 70, height 6' (1.829 m), weight 183 lb 3.2 oz (83.1 kg), SpO2 97 %.    General appearance: alert and no distress Neck: no adenopathy, no carotid bruit, no JVD, supple, symmetrical, trachea midline and thyroid not enlarged, symmetric, no tenderness/mass/nodules Lungs: clear to auscultation bilaterally Heart: regular rate and rhythm, S1, S2 normal, no murmur, click, rub or gallop Extremities: extremities normal, atraumatic, no cyanosis or edema Pulses: 2+ and symmetric Skin: Shallow ulcers on the tip of the left second and third toe and right second toe. Neurologic: Alert and oriented X 3, normal strength and tone. Normal symmetric reflexes. Normal coordination and gait  EKG not performed today  ASSESSMENT AND PLAN:   Hyperlipidemia, group D History of hyperlipidemia on statin therapy with recent lipid profile performed 06/20/16 revealing total is 129, LDL 63 and HDL of 40  Diabetic foot ulcers ([place]) [name] was referred by Dr. Fritzi Leach for vascular evaluation because of nonhealing ulcers on the tips of his left second and third toes and right second toe. He had Doppler studies in our office today that showed normal ABIs and TBI's with triphasic flow. He has palpable pedal pulses on exam. I think he probably has small vessel disease from his diabetes. I recommend continued aggressive local wound care and offloading.      Lorretta Harp MD FACP,FACC,FAHA, [facility] 04/23/2017 2:51 PM

## 2017-04-23 NOTE — Addendum Note (Signed)
Addended by: Debria Garret L on: 04/23/2017 04:00 PM   Modules accepted: Orders

## 2017-04-23 NOTE — Assessment & Plan Note (Signed)
Gerald Leach was referred by Dr. Fritzi Mandes for vascular evaluation because of nonhealing ulcers on the tips of his left second and third toes and right second toe. He had Doppler studies in our office today that showed normal ABIs and TBI's with triphasic flow. He has palpable pedal pulses on exam. I think he probably has small vessel disease from his diabetes. I recommend continued aggressive local wound care and offloading.

## 2017-04-24 ENCOUNTER — Ambulatory Visit: Payer: Medicare HMO | Admitting: Cardiovascular Disease

## 2017-04-25 ENCOUNTER — Ambulatory Visit: Payer: Medicare HMO | Admitting: Neurology

## 2017-04-25 DIAGNOSIS — L97529 Non-pressure chronic ulcer of other part of left foot with unspecified severity: Secondary | ICD-10-CM | POA: Diagnosis not present

## 2017-04-25 DIAGNOSIS — M2042 Other hammer toe(s) (acquired), left foot: Secondary | ICD-10-CM | POA: Diagnosis not present

## 2017-04-25 DIAGNOSIS — M2041 Other hammer toe(s) (acquired), right foot: Secondary | ICD-10-CM | POA: Diagnosis not present

## 2017-04-25 DIAGNOSIS — L97511 Non-pressure chronic ulcer of other part of right foot limited to breakdown of skin: Secondary | ICD-10-CM | POA: Diagnosis not present

## 2017-04-26 ENCOUNTER — Ambulatory Visit: Payer: Medicare HMO | Admitting: Neurology

## 2017-04-26 ENCOUNTER — Encounter: Payer: Self-pay | Admitting: Neurology

## 2017-04-26 VITALS — BP 110/74 | HR 90 | Ht 72.0 in | Wt 183.0 lb

## 2017-04-26 DIAGNOSIS — F0390 Unspecified dementia without behavioral disturbance: Secondary | ICD-10-CM

## 2017-04-26 DIAGNOSIS — R569 Unspecified convulsions: Secondary | ICD-10-CM | POA: Diagnosis not present

## 2017-04-26 DIAGNOSIS — R69 Illness, unspecified: Secondary | ICD-10-CM | POA: Diagnosis not present
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
## 2017-04-26 NOTE — Patient Instructions (Addendum)
Continue your medications as you are taking  Encouraged to consider in home caregiver to assist with daily activities  Return to clinic in 9 months

## 2017-04-28 IMAGING — MR MR HEAD W/O CM
11 series · 44 of 48 positions shown · non-contrast
Comparison: Previous MRI 10/03/2014. Multiple previous scans from
[id number] related to subdural evacuation.

CLINICAL DATA: Multifactorial dementia. History of subdural
hematoma, and giant RIGHT internal carotid artery aneurysm treated
with cavernous occlusion device. History of seizures. Increasing
LEFT leg weakness.

EXAM:
MRI HEAD WITHOUT CONTRAST
TECHNIQUE: Multiplanar, multiecho pulse sequences of the brain and surrounding
structures were obtained without intravenous contrast.

[Series 3: T1 · sagittal · 5.0mm · 0.45mm/px · 3 of 20 slices shown]
[im 1/20]
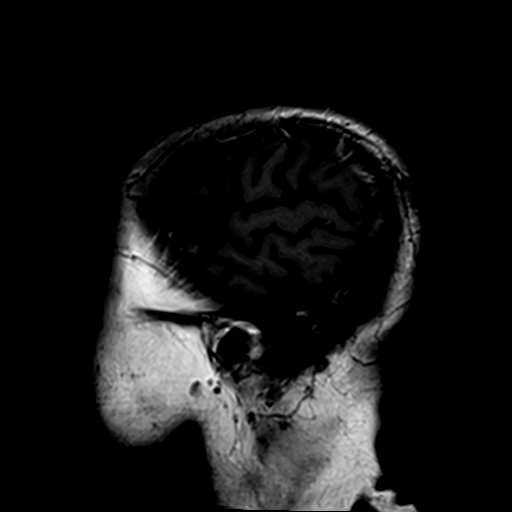
[im 10/20]
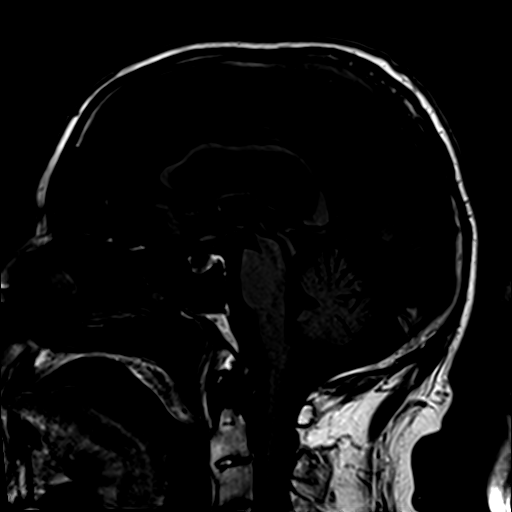
[im 20/20]
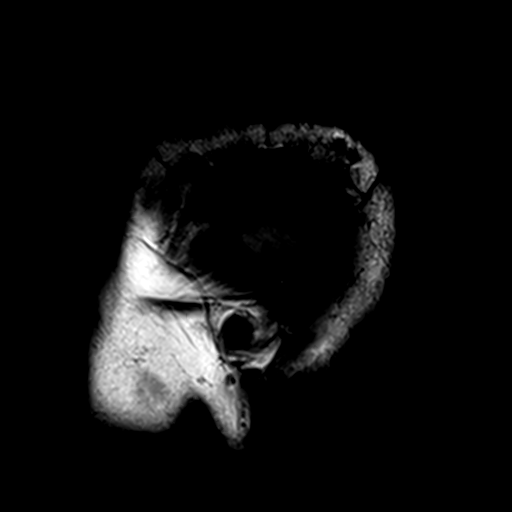

[Series 4: DWI · axial · 3.0mm · 1.80mm/px · z∈[-62,+84]mm · 9 of 100 slices shown (1 of 4)]
[im 1/100]
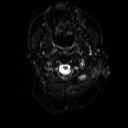
[im 13/100]
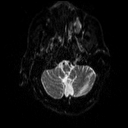
[im 25/100]
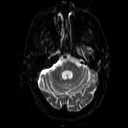
[im 38/100]
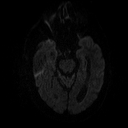
[im 50/100]
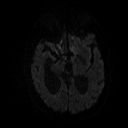
[im 62/100]
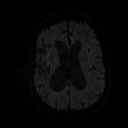
[im 75/100]
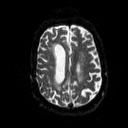
[im 87/100]
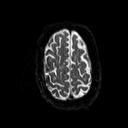
[im 100/100]
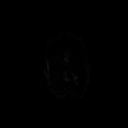

[Series 5: DWI · axial · 3.0mm · 1.80mm/px · z∈[-62,+84]mm · 4 of 48 slices shown (2 of 4)]
[im 1/48]
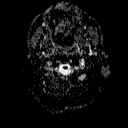
[im 16/48]
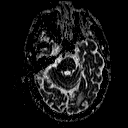
[im 32/48]
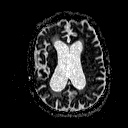
[im 48/48]
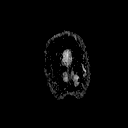

[Series 7: swi_images · axial · 2.0mm · 0.90mm/px · z∈[-69,+88]mm · 7 of 80 slices shown]
[im 1/80]
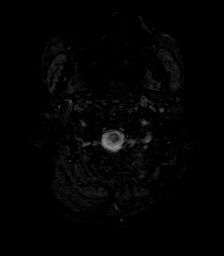
[im 14/80]
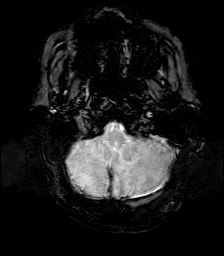
[im 27/80]
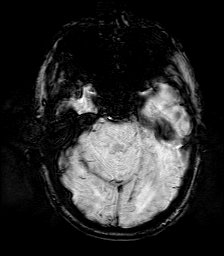
[im 40/80]
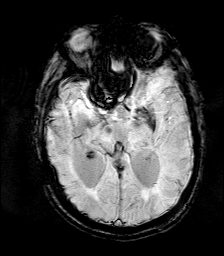
[im 53/80]
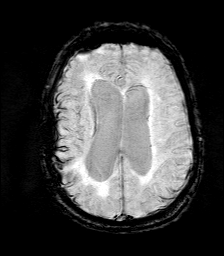
[im 66/80]
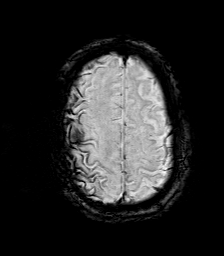
[im 80/80]
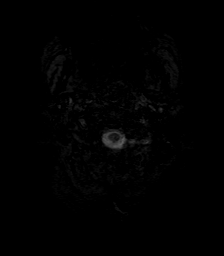

[Series 8: DWI · coronal · 5.0mm · 1.80mm/px · 6 of 60 slices shown (3 of 4)]
[im 1/60]
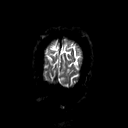
[im 12/60]
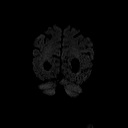
[im 24/60]
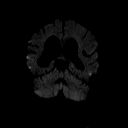
[im 36/60]
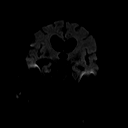
[im 48/60]
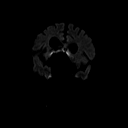
[im 60/60]
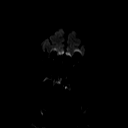

[Series 9: DWI · coronal · 5.0mm · 1.80mm/px · 3 of 30 slices shown (4 of 4)]
[im 1/30]
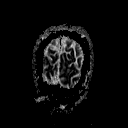
[im 15/30]
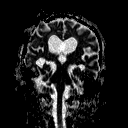
[im 30/30]
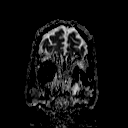

[Series 10: T2 · axial · 5.0mm · 0.45mm/px · z∈[-53,+82]mm · 2 of 22 slices shown (1 of 3)]
[im 1/22]
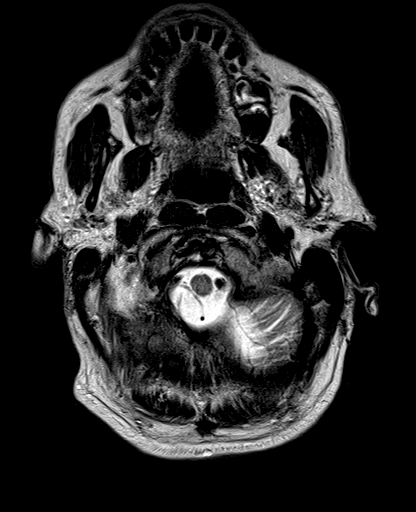
[im 22/22]
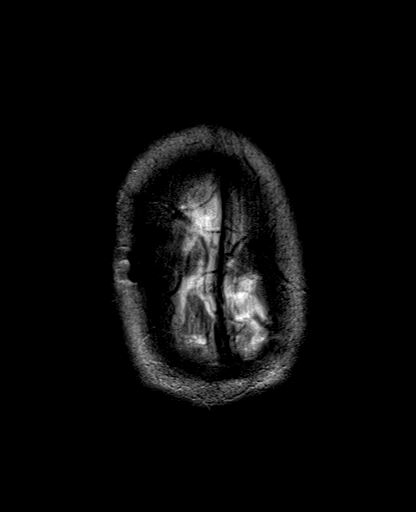

[Series 11: FLAIR · axial · 5.0mm · 0.45mm/px · z∈[-53,+82]mm · 2 of 22 slices shown]
[im 1/22]
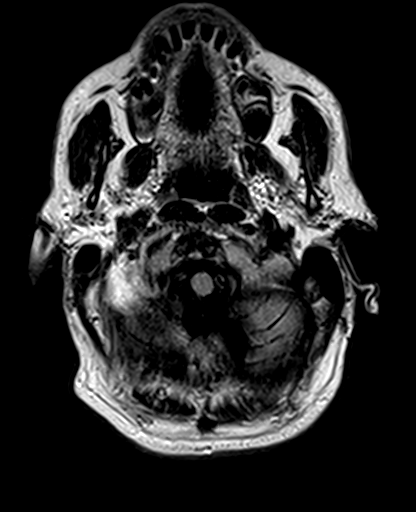
[im 22/22]
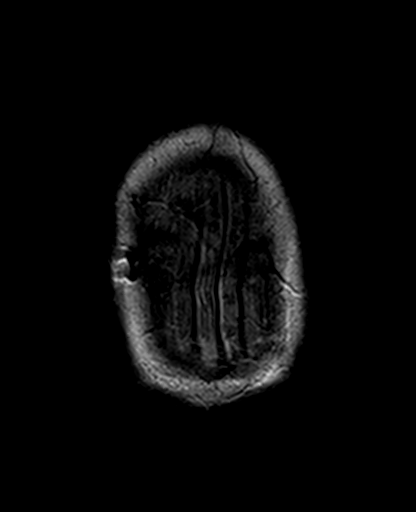

[Series 12: mpr tra · axial · 2.0mm · 0.45mm/px · z∈[-61,-9]mm · 3 of 80 slices shown]
[im 1/80]
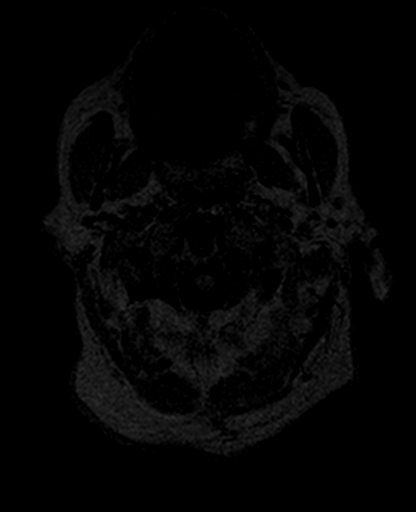
[im 14/80]
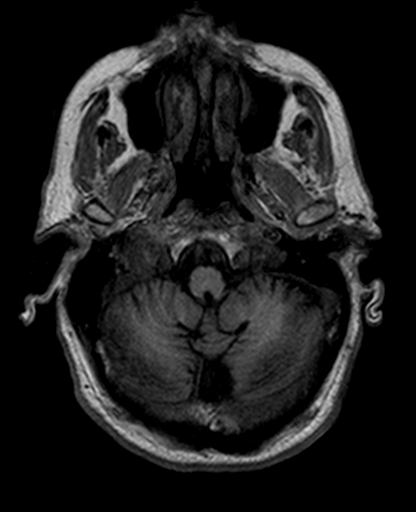
[im 27/80]
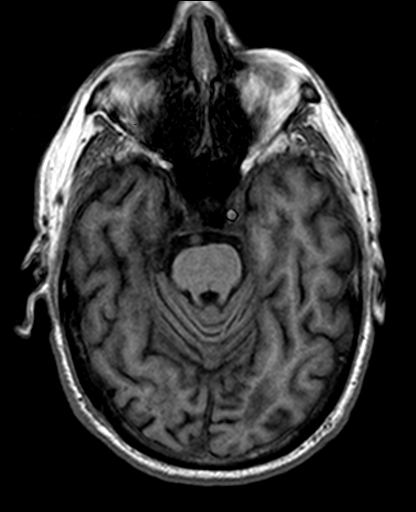

[Series 13: T2 · coronal · 5.0mm · 0.45mm/px · 2 of 24 slices shown (2 of 3)]
[im 1/24]
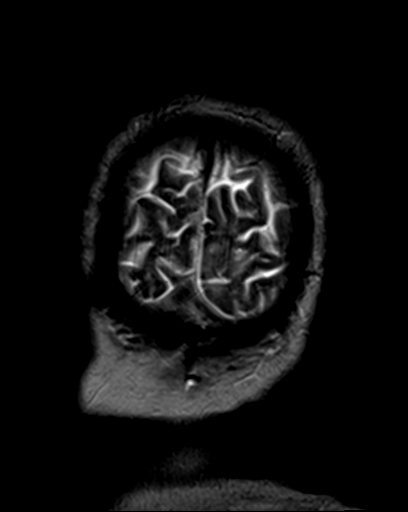
[im 24/24]
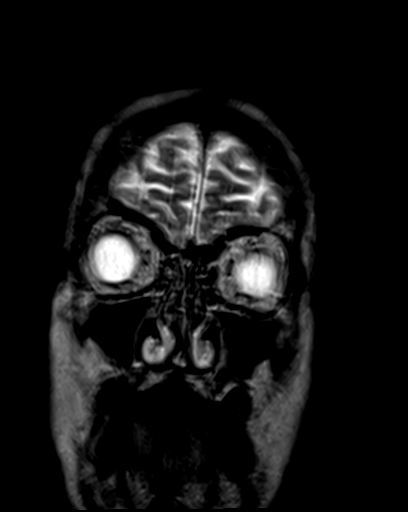

[Series 14: T2 · coronal · 3.0mm · 0.28mm/px · 3 of 28 slices shown (3 of 3)]
[im 1/28]
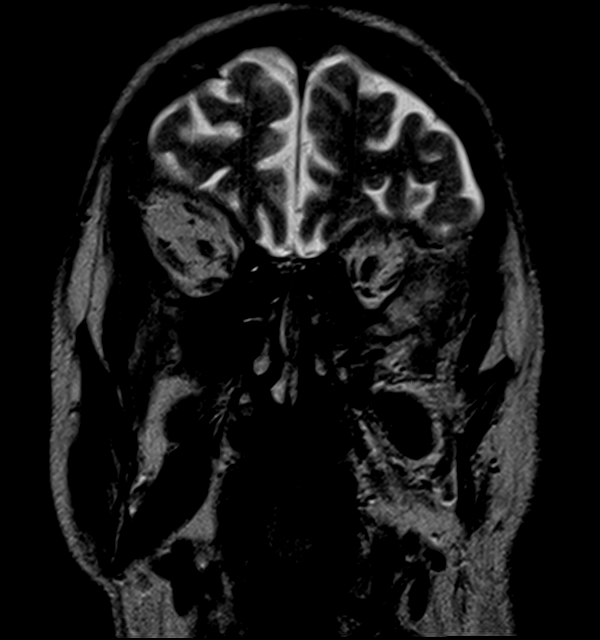
[im 14/28]
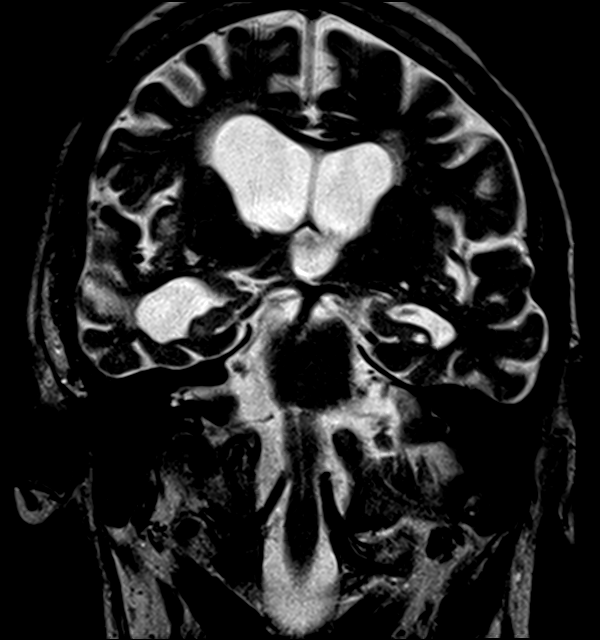
[im 28/28]
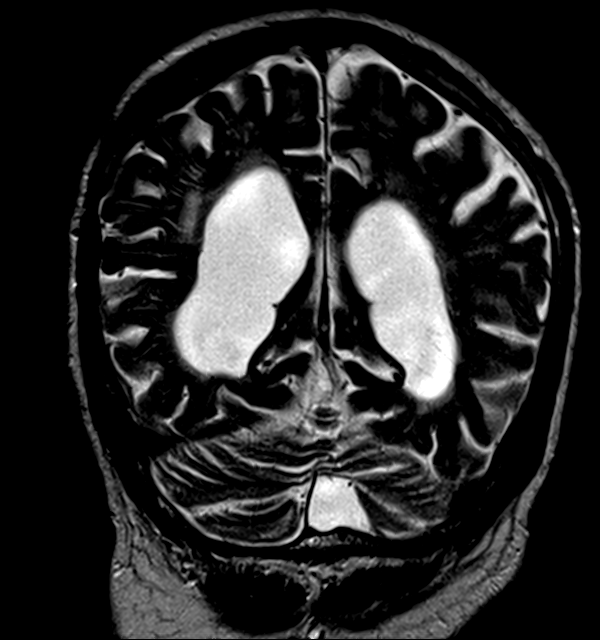

[44 of 48 positions shown; findings below may reference images not displayed]

FINDINGS: Chronically occluded RIGHT ICA. Thrombosed giant RIGHT ICA terminus
aneurysm, stable in size measuring 19 x 30 x 21 mm. Extensive
siderosis over the RIGHT sylvian fissure and convexity either
secondary to previous trauma or previous aneurysm rupture.

Disproportionate ventriculomegaly to the degree of cortical atrophy
measuring 52 mm biventricular diameter today, increased from 45 mm
on most remote prior CT dated 10/31/2012 given the periventricular
greater than subcortical white matter T2 and FLAIR hyperintensity,
both focal and continuous, the findings could represent normal
pressure hydrocephalus with transependymal absorption, particularly
given the chronic hemorrhage which is present. Consider lumbar
puncture as a diagnostic and potentially therapeutic maneuver to
assess for NPH. Disproportionate RIGHT temporal horn enlargement
likely related to RIGHT temporal lobe volume loss.

No restricted diffusion to suggest acute stroke. Attention was
specifically directed along the corticospinal tract subserving the
LEFT leg where no abnormality is seen. No acute hemorrhage, mass
lesion, or extra-axial fluid.

Previous RIGHT craniotomy. No tonsillar herniation or pituitary
mass. Basilar and LEFT carotid flow voids are maintained. No
extracranial soft tissue abnormality of significance.
IMPRESSION: Chronically occluded RIGHT ICA, related to treatment of the giant
thrombosed RIGHT ICA aneurysm roughly 2 x 3 x 2 cm.

Disproportionate ventriculomegaly up to 52 mm biventricular
diameter. Transependymal CSF could account for the periventricular
white matter signal. Normal pressure hydrocephalus not excluded.
Consider lumbar puncture for further evaluation.

No evidence for acute stroke as a cause for increasing LEFT leg
weakness.

## 2017-04-30 ENCOUNTER — Other Ambulatory Visit: Payer: Self-pay | Admitting: Neurology

## 2017-05-01 ENCOUNTER — Other Ambulatory Visit: Payer: Self-pay | Admitting: *Deleted

## 2017-05-01 MED ORDER — LACOSAMIDE 100 MG PO TABS: 1.0000 | ORAL_TABLET | Freq: Two times a day (BID) | ORAL | 3 refills | Status: DC

## 2017-05-23 DIAGNOSIS — L97529 Non-pressure chronic ulcer of other part of left foot with unspecified severity: Secondary | ICD-10-CM | POA: Diagnosis not present

## 2017-05-23 DIAGNOSIS — L97511 Non-pressure chronic ulcer of other part of right foot limited to breakdown of skin: Secondary | ICD-10-CM | POA: Diagnosis not present

## 2017-05-23 DIAGNOSIS — M2041 Other hammer toe(s) (acquired), right foot: Secondary | ICD-10-CM | POA: Diagnosis not present

## 2017-05-23 DIAGNOSIS — M2042 Other hammer toe(s) (acquired), left foot: Secondary | ICD-10-CM | POA: Diagnosis not present

## 2017-06-06 DIAGNOSIS — L97529 Non-pressure chronic ulcer of other part of left foot with unspecified severity: Secondary | ICD-10-CM | POA: Diagnosis not present

## 2017-06-06 DIAGNOSIS — L84 Corns and callosities: Secondary | ICD-10-CM | POA: Diagnosis not present

## 2017-06-06 DIAGNOSIS — L97511 Non-pressure chronic ulcer of other part of right foot limited to breakdown of skin: Secondary | ICD-10-CM | POA: Diagnosis not present

## 2017-06-06 DIAGNOSIS — E1151 Type 2 diabetes mellitus with diabetic peripheral angiopathy without gangrene: Secondary | ICD-10-CM | POA: Diagnosis not present

## 2017-06-06 DIAGNOSIS — B351 Tinea unguium: Secondary | ICD-10-CM | POA: Diagnosis not present

## 2017-06-06 DIAGNOSIS — E1351 Other specified diabetes mellitus with diabetic peripheral angiopathy without gangrene: Secondary | ICD-10-CM | POA: Diagnosis not present

## 2017-06-06 DIAGNOSIS — M2042 Other hammer toe(s) (acquired), left foot: Secondary | ICD-10-CM | POA: Diagnosis not present

## 2017-06-06 DIAGNOSIS — M2041 Other hammer toe(s) (acquired), right foot: Secondary | ICD-10-CM | POA: Diagnosis not present

## 2017-06-26 ENCOUNTER — Other Ambulatory Visit: Payer: Self-pay | Admitting: Family Medicine

## 2017-06-26 NOTE — Telephone Encounter (Signed)
Left a message for a return call on cell.  No answer or machine on home #.  Pt due for cpx and fasting lab work.

## 2017-07-02 ENCOUNTER — Other Ambulatory Visit: Payer: Self-pay | Admitting: Neurology

## 2017-07-02 DIAGNOSIS — R569 Unspecified convulsions: Secondary | ICD-10-CM

## 2017-07-02 MED ORDER — SIMVASTATIN 10 MG PO TABS: ORAL_TABLET | ORAL | 0 refills | Status: DC

## 2017-07-02 NOTE — Telephone Encounter (Signed)
Spoke to [employer] (son/DPR on file) and advised the pt is now due for cpx. Pt will discuss with his sister and call back to schedule.  90 day supply of medication sent to the pharmacy.

## 2017-07-04 ENCOUNTER — Other Ambulatory Visit: Payer: Self-pay | Admitting: Adult Health

## 2017-07-04 DIAGNOSIS — M2042 Other hammer toe(s) (acquired), left foot: Secondary | ICD-10-CM | POA: Diagnosis not present

## 2017-07-04 DIAGNOSIS — L97511 Non-pressure chronic ulcer of other part of right foot limited to breakdown of skin: Secondary | ICD-10-CM | POA: Diagnosis not present

## 2017-07-04 DIAGNOSIS — L97529 Non-pressure chronic ulcer of other part of left foot with unspecified severity: Secondary | ICD-10-CM | POA: Diagnosis not present

## 2017-07-04 DIAGNOSIS — M2041 Other hammer toe(s) (acquired), right foot: Secondary | ICD-10-CM | POA: Diagnosis not present

## 2017-07-05 ENCOUNTER — Telehealth: Payer: Self-pay | Admitting: Neurology

## 2017-07-05 ENCOUNTER — Other Ambulatory Visit: Payer: Self-pay | Admitting: *Deleted

## 2017-07-05 DIAGNOSIS — R569 Unspecified convulsions: Secondary | ICD-10-CM

## 2017-07-05 MED ORDER — LEVETIRACETAM 750 MG PO TABS: 1500.0000 mg | ORAL_TABLET | Freq: Two times a day (BID) | ORAL | 3 refills | Status: DC

## 2017-07-05 NOTE — Telephone Encounter (Signed)
Gerald Leach called patient's daughter in law. She said [facility] has discontinued the medication Levetiracetam 750 MG 4 X day that he takes. He does receive his Vimpat through [facility]. What should he do now? Please Call. Thanks

## 2017-07-05 NOTE — Telephone Encounter (Signed)
Sent in another Rx to [facility].

## 2017-07-08 ENCOUNTER — Telehealth: Payer: Self-pay | Admitting: Neurology

## 2017-07-08 NOTE — Telephone Encounter (Signed)
Gerald Leach called in regards to pt and had asked if his seizure medication Levetiracetam could be called in to [facility] delivery [facility] CB# [phone]

## 2017-07-08 NOTE — Telephone Encounter (Signed)
Gerald Leach notified that I did call [facility] and the Rx is in [place] and should get to them by tomorrow.

## 2017-07-08 NOTE — Telephone Encounter (Signed)
[facility] and the Rx has been shipped and is in [place].

## 2017-07-09 NOTE — Telephone Encounter (Signed)
Prescriptions filled for 90 days.  Pt has upcoming cpx on 07/24/17.

## 2017-07-10 ENCOUNTER — Telehealth: Payer: Self-pay | Admitting: Adult Health

## 2017-07-10 NOTE — Telephone Encounter (Signed)
Copied from [place]. Topic: Quick Communication - See Telephone Encounter >> Jul 10, 2017 10:16 AM Ivar Drape wrote: CRM for notification. See Telephone encounter for:  07/10/17. Patient needs the following medications faxed to his mail order pharmacist [facility], Fax: [phone]:  1) levothyroxine (SYNTHROID, LEVOTHROID) 75 MCG tablet  2) amantadine (SYMMETREL) 100 MG capsule

## 2017-07-10 NOTE — Telephone Encounter (Signed)
Both were sent to [facility] on 07/09/17

## 2017-07-24 ENCOUNTER — Ambulatory Visit (INDEPENDENT_AMBULATORY_CARE_PROVIDER_SITE_OTHER): Payer: Medicare HMO | Admitting: Adult Health

## 2017-07-24 ENCOUNTER — Encounter: Payer: Self-pay | Admitting: Adult Health

## 2017-07-24 VITALS — BP 110/70 | Temp 97.5°F | Ht 70.5 in | Wt 184.0 lb

## 2017-07-24 DIAGNOSIS — E783 Hyperchylomicronemia: Secondary | ICD-10-CM

## 2017-07-24 DIAGNOSIS — B353 Tinea pedis: Secondary | ICD-10-CM

## 2017-07-24 DIAGNOSIS — H6121 Impacted cerumen, right ear: Secondary | ICD-10-CM | POA: Diagnosis not present

## 2017-07-24 DIAGNOSIS — L97509 Non-pressure chronic ulcer of other part of unspecified foot with unspecified severity: Secondary | ICD-10-CM

## 2017-07-24 DIAGNOSIS — G40909 Epilepsy, unspecified, not intractable, without status epilepticus: Secondary | ICD-10-CM | POA: Diagnosis not present

## 2017-07-24 DIAGNOSIS — E032 Hypothyroidism due to medicaments and other exogenous substances: Secondary | ICD-10-CM

## 2017-07-24 DIAGNOSIS — Z0000 Encounter for general adult medical examination without abnormal findings: Secondary | ICD-10-CM | POA: Diagnosis not present

## 2017-07-24 DIAGNOSIS — E11621 Type 2 diabetes mellitus with foot ulcer: Secondary | ICD-10-CM

## 2017-07-24 DIAGNOSIS — E119 Type 2 diabetes mellitus without complications: Secondary | ICD-10-CM | POA: Diagnosis not present

## 2017-07-24 NOTE — Progress Notes (Deleted)
   Subjective:    Patient ID: DARRELD HOFFER, male    DOB: February 22, 1934, 84 y.o.   MRN: [id number]  HPI  Patient presents for yearly preventative medicine examination. He is a pleasant 84 year old male who  has a past medical history of Brain aneurysm, Colonic polyp (2003), Diabetes mellitus without complication ([place]), ED (erectile dysfunction), GERD (gastroesophageal reflux disease), Hyperlipidemia, Hypertension, Hypothyroidism, Seizures ([place]), and Seizures ([place]).    All immunizations and health maintenance protocols were reviewed with the patient and needed orders were placed.  Appropriate screening laboratory values were ordered for the patient including screening of hyperlipidemia, renal function and hepatic function. If indicated by BPH, a PSA was ordered.  Medication reconciliation,  past medical history, social history, problem list and allergies were reviewed in detail with the patient  Goals were established with regard to weight loss, exercise, and  diet in compliance with medications  End of life planning was discussed.    Review of Systems     Objective:   Physical Exam        Assessment & Plan:

## 2017-07-25 MED ORDER — NYSTATIN 100000 UNIT/GM EX POWD: Freq: Two times a day (BID) | CUTANEOUS | 3 refills | Status: DC

## 2017-07-25 NOTE — Progress Notes (Addendum)
Subjective:    [name], male    DOB: 1934-03-26, 84 y.o.   MRN: [id number]  HPI  Patient presents for yearly preventative medicine examination. He is a pleasant 84 year old male who  has a past medical history of Brain aneurysm, Colonic polyp (2003), Diabetes mellitus without complication ([place]), ED (erectile dysfunction), GERD (gastroesophageal reflux disease), Hyperlipidemia, Hypertension, Hypothyroidism, Seizures ([place]), and Seizures ([place]).  Presents with a daughter who helps provide history today.  He takes Synthroid 75 mcg for history of hypothyroidism  He takes metformin 500 mg BID due to history of diabetes Lab Results  Component Value Date   HGBA1C 6.0 06/20/2016   Takes simvastatin 10 mg for hyperlipidemia  He is followed by neurology for history of seizure due to SDH currently taking Keppra 1500 mg twice daily and Vimpat 100 mg twice daily.  No new seizures  Is currently also following with podiatry for diabetic foot care.  He reports being treated for multiple wounds on multiple toes of bilateral feet.  Follows with podiatry monthly.  His family reports that he is unable to change the bandages on the assigned schedule.  He is unable to bend over and remove bandages   All immunizations and health maintenance protocols were reviewed with the patient and needed orders were placed. He is up to date on all vaccinations   Appropriate screening laboratory values were ordered for the patient including screening of hyperlipidemia, renal function and hepatic function. If indicated by BPH, a PSA was ordered.  Medication reconciliation,  past medical history, social history, problem list and allergies were reviewed in detail with the patient  Goals were established with regard to weight loss, exercise, and  diet in compliance with medications  End of life planning was discussed.  He has an advanced directive and living well  Review of Systems  Constitutional:  Negative.   HENT: Negative.   Eyes: Negative.   Respiratory: Negative.   Cardiovascular: Negative.   Gastrointestinal: Negative.   Endocrine: Negative.   Genitourinary: Negative.   Musculoskeletal: Positive for arthralgias, back pain and gait problem.  Skin: Positive for wound.  Allergic/Immunologic: Negative.   Hematological: Negative.   Psychiatric/Behavioral: Negative.   All other systems reviewed and are negative.  Past Medical History:  Diagnosis Date  . Brain aneurysm   . Colonic polyp 2003  . Diabetes mellitus without complication ([place])   . ED (erectile dysfunction)   . GERD (gastroesophageal reflux disease)   . Hyperlipidemia   . Hypertension   . Hypothyroidism   . Seizures ([place])    per family  . Seizures ([place])     Social History   Socioeconomic History  . Marital status: Married    Spouse name: Not on file  . Number of children: 2  . Years of education: Not on file  . Highest education level: Not on file  Social Needs  . Financial resource strain: Not on file  . Food insecurity - worry: Not on file  . Food insecurity - inability: Not on file  . Transportation needs - medical: Not on file  . Transportation needs - non-medical: Not on file  Occupational History  . Not on file  Tobacco Use  . Smoking status: Never Smoker  . Smokeless tobacco: Never Used  Substance and Sexual Activity  . Alcohol use: Yes    Alcohol/week: 0.0 oz    Comment: rum mixed in diet coke 3 a week  . Drug use: Yes  Types: Nitrous oxide  . Sexual activity: Not on file  Other Topics Concern  . Not on file  Social History Narrative   ** Merged History Encounter **       Lives with wife in a 2 story home.  Has no trouble with the stairs as long as he holds on to the banister.  Education: college.  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      Past Surgical History:  Procedure Laterality Date  . Aneurysmal clipping    . brain aneurysm surgery     at  [facility]  . carotid artery aneurysm    . COLONOSCOPY     polyps  . CRANIOTOMY Right 09/19/2012   Procedure: CRANIOTOMY HEMATOMA EVACUATION SUBDURAL;  Surgeon: Otilio Connors, MD;  Location: [place] [facility];  Service: Neurosurgery;  Laterality: Right;  . CRANIOTOMY Right 09/22/2012   Procedure: CRANIOTOMY HEMATOMA EVACUATION SUBDURAL;  Surgeon: Otilio Connors, MD;  Location: [facility] [facility];  Service: Neurosurgery;  Laterality: Right;  . Craniotomy.    . right hernia      Family History  Problem Relation Age of Onset  . Liver disease Brother        Died  . Seizures Neg Hx     No Known Allergies  Current Outpatient Medications on File Prior to Visit  Medication Sig Dispense Refill  . amantadine (SYMMETREL) 100 MG capsule TAKE 1 CAPSULE DAILY 90 capsule 0  . ferrous sulfate 325 (65 FE) MG tablet Take 325 mg by mouth at bedtime.    Marland Kitchen glucose blood (ONETOUCH VERIO) test strip Use once daily Dx E11.9 100 each 12  . Lacosamide (VIMPAT) 100 MG TABS Take 1 tablet (100 mg total) 2 (two) times daily by mouth. 180 tablet 3  . Lancets (ONETOUCH ULTRASOFT) lancets Use as instructed 100 each 12  . levETIRAcetam (KEPPRA) 750 MG tablet Take 2 tablets (1,500 mg total) by mouth 2 (two) times daily. 360 tablet 3  . levothyroxine (SYNTHROID, LEVOTHROID) 75 MCG tablet TAKE 1 TABLET DAILY 90 tablet 0  . metFORMIN (GLUCOPHAGE) 500 MG tablet TAKE 1 TABLET TWICE A DAY  WITH A MEAL 180 tablet 0  . Multiple Vitamins-Minerals (CENTRUM SILVER 50+MEN PO) Take 1 tablet by mouth every morning.    . simvastatin (ZOCOR) 10 MG tablet TAKE 1 TABLET (10 MG TOTAL) BY MOUTH AT BEDTIME. 90 tablet 0  . vitamin B-12 (CYANOCOBALAMIN) 1000 MCG tablet Take 2,000 mcg by mouth at bedtime.     No current facility-administered medications on file prior to visit.     BP 110/70 (BP Location: Left Arm)   Temp (!) 97.5 F (36.4 C) (Oral)   Ht 5' 10.5" (1.791 m)   Wt 184 lb (83.5 kg)   BMI 26.03 kg/m       Objective:   Physical  Exam  Constitutional: He is oriented to person, place, and time. He appears well-developed and well-nourished. No distress.  HENT:  Cerumen impaction noted in right ear canal  Eyes: Conjunctivae and EOM are normal. Pupils are equal, round, and reactive to light. Right eye exhibits no discharge. Left eye exhibits no discharge. No scleral icterus.  Neck: Normal range of motion. Neck supple. No JVD present. Carotid bruit is not present. No tracheal deviation present. No thyromegaly present.  Cardiovascular: Normal rate, regular rhythm, normal heart sounds and intact distal pulses. Exam reveals no gallop and no friction rub.  No murmur heard. Pulmonary/Chest: Effort normal and  breath sounds normal. No stridor. No respiratory distress. He has no wheezes. He has no rales. He exhibits no tenderness.  Abdominal: Soft. Bowel sounds are normal. He exhibits no distension and no mass. There is no tenderness. There is no rebound and no guarding.  Genitourinary:  Genitourinary Comments: Deferred  Musculoskeletal: Normal range of motion. He exhibits no edema, tenderness or deformity.  Walks with a single prong cane he has slow steady gait  Lymphadenopathy:    He has no cervical adenopathy.  Neurological: He is alert and oriented to person, place, and time. He has normal reflexes. He displays normal reflexes. No cranial nerve deficit. He exhibits normal muscle tone. Coordination normal.  Skin: Skin is warm. No rash noted. He is not diaphoretic. No erythema. No pallor.  - wounds to distal aspect of multiple toes on bilateral feet.  These wounds do not appear infected. -Tinea pedis noted in the web spacing of multiple toes on bilateral feet  Psychiatric: He has a normal mood and affect. His behavior is normal. Judgment and thought content normal.  Nursing note and vitals reviewed.     Assessment & Plan:  1. Routine general medical examination at a health care facility - Basic metabolic panel; Future - CBC  with Differential/Platelet; Future - Hepatic function panel; Future - Lipid panel; Future - Hemoglobin A1c; Future - TSH; Future - PSA; Future  2. Hypothyroidism due to non-medication exogenous substances - consider increase in synthroid  - TSH; Future  3. Controlled type 2 diabetes mellitus without complication, without long-term current use of insulin ([place]) - Consider increasing metformin   - Basic metabolic panel; Future - CBC with Differential/Platelet; Future - Hepatic function panel; Future - Lipid panel; Future - Hemoglobin A1c; Future - TSH; Future  4. Seizure disorder ([place]) - Follow up with neurology as directed - Encouraged rolling walker with ambulation   5. Hyperlipidemia, group D - consider increasing statin  - Basic metabolic panel; Future - CBC with Differential/Platelet; Future - Hepatic function panel; Future - Lipid panel; Future - Hemoglobin A1c; Future - TSH; Future  6. Diabetic ulcer of toe associated with type 2 diabetes mellitus, unspecified laterality, unspecified ulcer stage ([place])  - Ambulatory referral to [place]  7. Tinea pedis of both feet  - nystatin (MYCOSTATIN/NYSTOP) powder; Apply topically 2 (two) times daily.  Dispense: 45 g; Refill: 3  8. Impacted cerumen of right ear -Cerumen impaction was easily removed with irrigation.  Patient endorsed from an hearing.  No signs of infection noted  [name], NP

## 2017-07-29 ENCOUNTER — Other Ambulatory Visit (INDEPENDENT_AMBULATORY_CARE_PROVIDER_SITE_OTHER): Payer: Medicare HMO

## 2017-07-29 ENCOUNTER — Other Ambulatory Visit: Payer: Medicare HMO

## 2017-07-29 DIAGNOSIS — Z0000 Encounter for general adult medical examination without abnormal findings: Secondary | ICD-10-CM

## 2017-07-29 DIAGNOSIS — E119 Type 2 diabetes mellitus without complications: Secondary | ICD-10-CM | POA: Diagnosis not present

## 2017-07-29 DIAGNOSIS — E032 Hypothyroidism due to medicaments and other exogenous substances: Secondary | ICD-10-CM | POA: Diagnosis not present

## 2017-07-29 DIAGNOSIS — Z125 Encounter for screening for malignant neoplasm of prostate: Secondary | ICD-10-CM

## 2017-07-29 DIAGNOSIS — E783 Hyperchylomicronemia: Secondary | ICD-10-CM | POA: Diagnosis not present

## 2017-07-29 LAB — CBC WITH DIFFERENTIAL/PLATELET
BASOS PCT: 0.6 %
Basophils Absolute: 42 cells/uL (ref 0–200)
Eosinophils Absolute: 84 cells/uL (ref 15–500)
Eosinophils Relative: 1.2 %
HEMATOCRIT: 45.1 % (ref 38.5–50.0)
Hemoglobin: 15.2 g/dL (ref 13.2–17.1)
LYMPHS ABS: 2135 {cells}/uL (ref 850–3900)
MCH: 27.9 pg (ref 27.0–33.0)
MCHC: 33.7 g/dL (ref 32.0–36.0)
MCV: 82.9 fL (ref 80.0–100.0)
MPV: 10.3 fL (ref 7.5–12.5)
Monocytes Relative: 8.5 %
Neutro Abs: 4144 cells/uL (ref 1500–7800)
Neutrophils Relative %: 59.2 %
Platelets: 241 10*3/uL (ref 140–400)
RBC: 5.44 10*6/uL (ref 4.20–5.80)
RDW: 13 % (ref 11.0–15.0)
Total Lymphocyte: 30.5 %
WBC mixed population: 595 cells/uL (ref 200–950)
WBC: 7 10*3/uL (ref 3.8–10.8)

## 2017-07-29 LAB — HEPATIC FUNCTION PANEL
ALBUMIN: 4.1 g/dL (ref 3.5–5.2)
ALT: 11 U/L (ref 0–53)
AST: 12 U/L (ref 0–37)
Alkaline Phosphatase: 77 U/L (ref 39–117)
BILIRUBIN TOTAL: 0.7 mg/dL (ref 0.2–1.2)
Bilirubin, Direct: 0.2 mg/dL (ref 0.0–0.3)
Total Protein: 7.1 g/dL (ref 6.0–8.3)

## 2017-07-29 LAB — PSA: PSA: 0.42 ng/mL (ref 0.10–4.00)

## 2017-07-29 LAB — LIPID PANEL
CHOLESTEROL: 124 mg/dL (ref 0–200)
HDL: 35.1 mg/dL — AB (ref >39.00)
LDL Cholesterol: 66 mg/dL (ref 0–99)
NonHDL: 88.99
Total CHOL/HDL Ratio: 4
Triglycerides: 115 mg/dL (ref 0.0–149.0)
VLDL: 23 mg/dL (ref 0.0–40.0)

## 2017-07-29 LAB — HEMOGLOBIN A1C: Hgb A1c MFr Bld: 6.2 % (ref 4.6–6.5)

## 2017-07-29 LAB — BASIC METABOLIC PANEL
BUN: 17 mg/dL (ref 6–23)
CALCIUM: 9.1 mg/dL (ref 8.4–10.5)
CO2: 31 mEq/L (ref 19–32)
CREATININE: 0.77 mg/dL (ref 0.40–1.50)
Chloride: 98 mEq/L (ref 96–112)
GFR: 102.86 mL/min (ref >60.00)
GLUCOSE: 116 mg/dL — AB (ref 70–99)
Potassium: 4.3 mEq/L (ref 3.5–5.1)
SODIUM: 137 meq/L (ref 135–145)

## 2017-07-29 LAB — TSH: TSH: 8.26 u[IU]/mL — ABNORMAL HIGH (ref 0.35–4.50)

## 2017-07-29 NOTE — Addendum Note (Signed)
Addended by: Tomi Likens on: 07/29/2017 11:45 AM   Modules accepted: Orders

## 2017-08-08 DIAGNOSIS — M79671 Pain in right foot: Secondary | ICD-10-CM | POA: Diagnosis not present

## 2017-08-08 DIAGNOSIS — L97511 Non-pressure chronic ulcer of other part of right foot limited to breakdown of skin: Secondary | ICD-10-CM | POA: Diagnosis not present

## 2017-08-08 DIAGNOSIS — L97529 Non-pressure chronic ulcer of other part of left foot with unspecified severity: Secondary | ICD-10-CM | POA: Diagnosis not present

## 2017-08-08 DIAGNOSIS — M79672 Pain in left foot: Secondary | ICD-10-CM | POA: Diagnosis not present

## 2017-09-05 DIAGNOSIS — L97511 Non-pressure chronic ulcer of other part of right foot limited to breakdown of skin: Secondary | ICD-10-CM | POA: Diagnosis not present

## 2017-09-05 DIAGNOSIS — M2042 Other hammer toe(s) (acquired), left foot: Secondary | ICD-10-CM | POA: Diagnosis not present

## 2017-09-05 DIAGNOSIS — M2041 Other hammer toe(s) (acquired), right foot: Secondary | ICD-10-CM | POA: Diagnosis not present

## 2017-09-05 DIAGNOSIS — L97529 Non-pressure chronic ulcer of other part of left foot with unspecified severity: Secondary | ICD-10-CM | POA: Diagnosis not present

## 2017-09-30 ENCOUNTER — Other Ambulatory Visit: Payer: Self-pay | Admitting: Adult Health

## 2017-10-01 NOTE — Telephone Encounter (Signed)
Sent to the pharmacy by e-scribe. 

## 2017-10-03 DIAGNOSIS — L97529 Non-pressure chronic ulcer of other part of left foot with unspecified severity: Secondary | ICD-10-CM | POA: Diagnosis not present

## 2017-10-03 DIAGNOSIS — E1151 Type 2 diabetes mellitus with diabetic peripheral angiopathy without gangrene: Secondary | ICD-10-CM | POA: Diagnosis not present

## 2017-10-03 DIAGNOSIS — B351 Tinea unguium: Secondary | ICD-10-CM | POA: Diagnosis not present

## 2017-10-03 DIAGNOSIS — L97511 Non-pressure chronic ulcer of other part of right foot limited to breakdown of skin: Secondary | ICD-10-CM | POA: Diagnosis not present

## 2017-10-03 DIAGNOSIS — L84 Corns and callosities: Secondary | ICD-10-CM | POA: Diagnosis not present

## 2017-10-20 ENCOUNTER — Other Ambulatory Visit: Payer: Self-pay | Admitting: Adult Health

## 2017-10-22 NOTE — Telephone Encounter (Signed)
Metformin sent to the pharmacy by e-scribe for 90 days.  Will hold levothyroxine until pt has lab work on 10/28/17.

## 2017-10-22 NOTE — Telephone Encounter (Signed)
You mentioned changing levothyroxine in last note based on lab results.  Please advise.  TSH was elevated.  Do you want to see pt every 3 or 6 months for A1C follow up?

## 2017-10-22 NOTE — Telephone Encounter (Signed)
Ok to refill Metformin for 90 +1 - we do not need to check his A1c every 3 months   I would like to check TSH within the next month but ok to refill until then

## 2017-10-28 ENCOUNTER — Other Ambulatory Visit (INDEPENDENT_AMBULATORY_CARE_PROVIDER_SITE_OTHER): Payer: Medicare HMO

## 2017-10-28 DIAGNOSIS — E039 Hypothyroidism, unspecified: Secondary | ICD-10-CM

## 2017-10-28 LAB — TSH: TSH: 4.68 u[IU]/mL — ABNORMAL HIGH (ref 0.35–4.50)

## 2017-10-29 NOTE — Telephone Encounter (Signed)
Ok to refill for one year. I am going to keep him on this dose.

## 2017-10-29 NOTE — Telephone Encounter (Signed)
Gerald Leach, thyroid level still elevated. Please advise.

## 2017-10-29 NOTE — Telephone Encounter (Signed)
Sent to the pharmacy by e-scribe. 

## 2017-10-30 ENCOUNTER — Other Ambulatory Visit: Payer: Self-pay | Admitting: Neurology

## 2017-10-30 DIAGNOSIS — E1151 Type 2 diabetes mellitus with diabetic peripheral angiopathy without gangrene: Secondary | ICD-10-CM | POA: Diagnosis not present

## 2017-10-30 DIAGNOSIS — L97529 Non-pressure chronic ulcer of other part of left foot with unspecified severity: Secondary | ICD-10-CM | POA: Diagnosis not present

## 2017-10-30 DIAGNOSIS — L97511 Non-pressure chronic ulcer of other part of right foot limited to breakdown of skin: Secondary | ICD-10-CM | POA: Diagnosis not present

## 2017-11-02 ENCOUNTER — Other Ambulatory Visit: Payer: Self-pay | Admitting: Adult Health

## 2017-11-05 NOTE — Telephone Encounter (Signed)
Should neuro fill this medication?

## 2017-11-05 NOTE — Telephone Encounter (Signed)
[facility] for neuro to fill

## 2017-12-04 DIAGNOSIS — E1151 Type 2 diabetes mellitus with diabetic peripheral angiopathy without gangrene: Secondary | ICD-10-CM | POA: Diagnosis not present

## 2017-12-04 DIAGNOSIS — L84 Corns and callosities: Secondary | ICD-10-CM | POA: Diagnosis not present

## 2017-12-04 DIAGNOSIS — L97529 Non-pressure chronic ulcer of other part of left foot with unspecified severity: Secondary | ICD-10-CM | POA: Diagnosis not present

## 2017-12-04 DIAGNOSIS — L97511 Non-pressure chronic ulcer of other part of right foot limited to breakdown of skin: Secondary | ICD-10-CM | POA: Diagnosis not present

## 2017-12-04 DIAGNOSIS — M79672 Pain in left foot: Secondary | ICD-10-CM | POA: Diagnosis not present

## 2017-12-04 DIAGNOSIS — M79671 Pain in right foot: Secondary | ICD-10-CM | POA: Diagnosis not present

## 2017-12-04 DIAGNOSIS — B351 Tinea unguium: Secondary | ICD-10-CM | POA: Diagnosis not present

## 2018-01-08 DIAGNOSIS — L97529 Non-pressure chronic ulcer of other part of left foot with unspecified severity: Secondary | ICD-10-CM | POA: Diagnosis not present

## 2018-01-08 DIAGNOSIS — L97511 Non-pressure chronic ulcer of other part of right foot limited to breakdown of skin: Secondary | ICD-10-CM | POA: Diagnosis not present

## 2018-01-08 DIAGNOSIS — E1151 Type 2 diabetes mellitus with diabetic peripheral angiopathy without gangrene: Secondary | ICD-10-CM | POA: Diagnosis not present

## 2018-01-27 ENCOUNTER — Ambulatory Visit: Payer: Medicare HMO | Admitting: Neurology

## 2018-02-05 ENCOUNTER — Other Ambulatory Visit: Payer: Self-pay | Admitting: *Deleted

## 2018-02-05 MED ORDER — METFORMIN HCL 500 MG PO TABS: 500.0000 mg | ORAL_TABLET | Freq: Two times a day (BID) | ORAL | 0 refills | Status: DC

## 2018-02-12 DIAGNOSIS — E1151 Type 2 diabetes mellitus with diabetic peripheral angiopathy without gangrene: Secondary | ICD-10-CM | POA: Diagnosis not present

## 2018-02-12 DIAGNOSIS — L97529 Non-pressure chronic ulcer of other part of left foot with unspecified severity: Secondary | ICD-10-CM | POA: Diagnosis not present

## 2018-02-12 DIAGNOSIS — M79671 Pain in right foot: Secondary | ICD-10-CM | POA: Diagnosis not present

## 2018-02-12 DIAGNOSIS — B351 Tinea unguium: Secondary | ICD-10-CM | POA: Diagnosis not present

## 2018-02-12 DIAGNOSIS — L84 Corns and callosities: Secondary | ICD-10-CM | POA: Diagnosis not present

## 2018-02-28 ENCOUNTER — Other Ambulatory Visit: Payer: Self-pay | Admitting: Adult Health

## 2018-03-04 NOTE — Telephone Encounter (Signed)
Sent to the pharmacy by e-scribe. 

## 2018-03-05 ENCOUNTER — Emergency Department (HOSPITAL_COMMUNITY): Payer: Medicare HMO

## 2018-03-05 ENCOUNTER — Observation Stay (HOSPITAL_COMMUNITY): Payer: Medicare HMO

## 2018-03-05 ENCOUNTER — Inpatient Hospital Stay (HOSPITAL_COMMUNITY)
Admission: EM | Admit: 2018-03-05 | Discharge: 2018-03-07 | DRG: 101 | Disposition: A | Discharge status: Home Health | Payer: Medicare HMO | Attending: Family Medicine | Admitting: Family Medicine

## 2018-03-05 DIAGNOSIS — I729 Aneurysm of unspecified site: Secondary | ICD-10-CM | POA: Diagnosis present

## 2018-03-05 DIAGNOSIS — Z79899 Other long term (current) drug therapy: Secondary | ICD-10-CM | POA: Diagnosis not present

## 2018-03-05 DIAGNOSIS — G40909 Epilepsy, unspecified, not intractable, without status epilepticus: Secondary | ICD-10-CM | POA: Diagnosis not present

## 2018-03-05 DIAGNOSIS — R809 Proteinuria, unspecified: Secondary | ICD-10-CM | POA: Diagnosis present

## 2018-03-05 DIAGNOSIS — R69 Illness, unspecified: Secondary | ICD-10-CM | POA: Diagnosis not present

## 2018-03-05 DIAGNOSIS — E118 Type 2 diabetes mellitus with unspecified complications: Secondary | ICD-10-CM | POA: Diagnosis not present

## 2018-03-05 DIAGNOSIS — I10 Essential (primary) hypertension: Secondary | ICD-10-CM | POA: Diagnosis not present

## 2018-03-05 DIAGNOSIS — K219 Gastro-esophageal reflux disease without esophagitis: Secondary | ICD-10-CM | POA: Diagnosis not present

## 2018-03-05 DIAGNOSIS — E876 Hypokalemia: Secondary | ICD-10-CM | POA: Diagnosis not present

## 2018-03-05 DIAGNOSIS — Z7984 Long term (current) use of oral hypoglycemic drugs: Secondary | ICD-10-CM

## 2018-03-05 DIAGNOSIS — E119 Type 2 diabetes mellitus without complications: Secondary | ICD-10-CM | POA: Diagnosis present

## 2018-03-05 DIAGNOSIS — E032 Hypothyroidism due to medicaments and other exogenous substances: Secondary | ICD-10-CM | POA: Diagnosis not present

## 2018-03-05 DIAGNOSIS — E039 Hypothyroidism, unspecified: Secondary | ICD-10-CM | POA: Diagnosis present

## 2018-03-05 DIAGNOSIS — R531 Weakness: Secondary | ICD-10-CM | POA: Diagnosis not present

## 2018-03-05 DIAGNOSIS — I4891 Unspecified atrial fibrillation: Secondary | ICD-10-CM | POA: Diagnosis not present

## 2018-03-05 DIAGNOSIS — E785 Hyperlipidemia, unspecified: Secondary | ICD-10-CM | POA: Diagnosis present

## 2018-03-05 DIAGNOSIS — Z86010 Personal history of colonic polyps: Secondary | ICD-10-CM

## 2018-03-05 DIAGNOSIS — R2681 Unsteadiness on feet: Secondary | ICD-10-CM | POA: Diagnosis present

## 2018-03-05 DIAGNOSIS — R2689 Other abnormalities of gait and mobility: Secondary | ICD-10-CM | POA: Diagnosis present

## 2018-03-05 DIAGNOSIS — D72829 Elevated white blood cell count, unspecified: Secondary | ICD-10-CM | POA: Diagnosis present

## 2018-03-05 DIAGNOSIS — E783 Hyperchylomicronemia: Secondary | ICD-10-CM | POA: Diagnosis present

## 2018-03-05 DIAGNOSIS — G20 Parkinson's disease: Secondary | ICD-10-CM | POA: Diagnosis not present

## 2018-03-05 DIAGNOSIS — Z9114 Patient's other noncompliance with medication regimen: Secondary | ICD-10-CM | POA: Diagnosis not present

## 2018-03-05 DIAGNOSIS — R4587 Impulsiveness: Secondary | ICD-10-CM | POA: Diagnosis present

## 2018-03-05 DIAGNOSIS — Z79890 Hormone replacement therapy (postmenopausal): Secondary | ICD-10-CM

## 2018-03-05 DIAGNOSIS — R0989 Other specified symptoms and signs involving the circulatory and respiratory systems: Secondary | ICD-10-CM | POA: Diagnosis not present

## 2018-03-05 DIAGNOSIS — R569 Unspecified convulsions: Secondary | ICD-10-CM

## 2018-03-05 DIAGNOSIS — R0902 Hypoxemia: Secondary | ICD-10-CM | POA: Diagnosis not present

## 2018-03-05 LAB — GLUCOSE, CAPILLARY: Glucose-Capillary: 107 mg/dL — ABNORMAL HIGH (ref 70–99)

## 2018-03-05 LAB — COMPREHENSIVE METABOLIC PANEL
ALT: 240 U/L — ABNORMAL HIGH (ref 0–44)
AST: 407 U/L — ABNORMAL HIGH (ref 15–41)
Albumin: 3.5 g/dL (ref 3.5–5.0)
Alkaline Phosphatase: 204 U/L — ABNORMAL HIGH (ref 38–126)
Anion gap: 11 (ref 5–15)
BUN: 7 mg/dL — ABNORMAL LOW (ref 8–23)
CHLORIDE: 103 mmol/L (ref 98–111)
CO2: 25 mmol/L (ref 22–32)
CREATININE: 0.94 mg/dL (ref 0.61–1.24)
Calcium: 8.9 mg/dL (ref 8.9–10.3)
Glucose, Bld: 159 mg/dL — ABNORMAL HIGH (ref 70–99)
POTASSIUM: 4 mmol/L (ref 3.5–5.1)
Sodium: 139 mmol/L (ref 135–145)
Total Bilirubin: 2.7 mg/dL — ABNORMAL HIGH (ref 0.3–1.2)
Total Protein: 7.2 g/dL (ref 6.5–8.1)

## 2018-03-05 LAB — CBC
HEMATOCRIT: 47.6 % (ref 39.0–52.0)
HEMOGLOBIN: 15.2 g/dL (ref 13.0–17.0)
MCH: 28 pg (ref 26.0–34.0)
MCHC: 31.9 g/dL (ref 30.0–36.0)
MCV: 87.8 fL (ref 78.0–100.0)
PLATELETS: 227 10*3/uL (ref 150–400)
RBC: 5.42 MIL/uL (ref 4.22–5.81)
RDW: 14 % (ref 11.5–15.5)
WBC: 18.9 10*3/uL — AB (ref 4.0–10.5)

## 2018-03-05 LAB — TSH: TSH: 2.341 u[IU]/mL (ref 0.350–4.500)

## 2018-03-05 LAB — CBG MONITORING, ED: Glucose-Capillary: 117 mg/dL — ABNORMAL HIGH (ref 70–99)

## 2018-03-05 LAB — ACETAMINOPHEN LEVEL: Acetaminophen (Tylenol), Serum: <10 ug/mL — ABNORMAL LOW (ref 10–30)

## 2018-03-05 LAB — ETHANOL: Alcohol, Ethyl (B): <10 mg/dL (ref <10)

## 2018-03-05 MED ORDER — INSULIN ASPART 100 UNIT/ML ~~LOC~~ SOLN: 0.0000 [IU] | Freq: Every day | SUBCUTANEOUS | Status: DC

## 2018-03-05 MED ORDER — SODIUM CHLORIDE 0.9% FLUSH
3.0000 mL | Freq: Two times a day (BID) | INTRAVENOUS | Status: DC
  Administered 2018-03-05 – 2018-03-06 (×2): 3 mL via INTRAVENOUS

## 2018-03-05 MED ORDER — ONDANSETRON HCL 4 MG PO TABS: 4.0000 mg | ORAL_TABLET | Freq: Four times a day (QID) | ORAL | Status: DC | PRN

## 2018-03-05 MED ORDER — LEVOTHYROXINE SODIUM 75 MCG PO TABS
75.0000 ug | ORAL_TABLET | Freq: Every day | ORAL | Status: DC
  Administered 2018-03-06 – 2018-03-07 (×2): 75 ug via ORAL
  Filled 2018-03-05 (×2): qty 1

## 2018-03-05 MED ORDER — ALBUTEROL SULFATE (2.5 MG/3ML) 0.083% IN NEBU: 2.5000 mg | INHALATION_SOLUTION | Freq: Four times a day (QID) | RESPIRATORY_TRACT | Status: DC | PRN

## 2018-03-05 MED ORDER — SODIUM CHLORIDE 0.9 % IV SOLN
75.0000 mL/h | INTRAVENOUS | Status: DC
  Administered 2018-03-06 – 2018-03-07 (×2): 75 mL/h via INTRAVENOUS

## 2018-03-05 MED ORDER — LORAZEPAM 2 MG/ML IJ SOLN: 1.0000 mg | INTRAMUSCULAR | Status: DC | PRN

## 2018-03-05 MED ORDER — FERROUS SULFATE 325 (65 FE) MG PO TABS
325.0000 mg | ORAL_TABLET | Freq: Every day | ORAL | Status: DC
  Administered 2018-03-05 – 2018-03-06 (×2): 325 mg via ORAL
  Filled 2018-03-05 (×2): qty 1

## 2018-03-05 MED ORDER — LORAZEPAM 2 MG/ML IJ SOLN
INTRAMUSCULAR | Status: AC
  Administered 2018-03-05: 1 mg via INTRAVENOUS
  Filled 2018-03-05: qty 1

## 2018-03-05 MED ORDER — ONDANSETRON HCL 4 MG/2ML IJ SOLN: 4.0000 mg | Freq: Four times a day (QID) | INTRAMUSCULAR | Status: DC | PRN

## 2018-03-05 MED ORDER — AMANTADINE HCL 100 MG PO CAPS
100.0000 mg | ORAL_CAPSULE | Freq: Every day | ORAL | Status: DC
  Administered 2018-03-06 – 2018-03-07 (×2): 100 mg via ORAL
  Filled 2018-03-05 (×2): qty 1

## 2018-03-05 MED ORDER — INSULIN ASPART 100 UNIT/ML ~~LOC~~ SOLN
0.0000 [IU] | Freq: Three times a day (TID) | SUBCUTANEOUS | Status: DC
  Administered 2018-03-06 – 2018-03-07 (×2): 1 [IU] via SUBCUTANEOUS

## 2018-03-05 MED ORDER — LEVETIRACETAM IN NACL 1500 MG/100ML IV SOLN
1500.0000 mg | Freq: Once | INTRAVENOUS | Status: AC
  Administered 2018-03-05: 1500 mg via INTRAVENOUS
  Filled 2018-03-05 (×2): qty 100

## 2018-03-05 MED ORDER — LACOSAMIDE 100 MG PO TABS: 1.0000 | ORAL_TABLET | Freq: Two times a day (BID) | ORAL | Status: DC

## 2018-03-05 MED ORDER — LEVETIRACETAM 750 MG PO TABS
1500.0000 mg | ORAL_TABLET | Freq: Two times a day (BID) | ORAL | Status: DC
  Administered 2018-03-06 – 2018-03-07 (×3): 1500 mg via ORAL
  Filled 2018-03-05 (×3): qty 2

## 2018-03-05 MED ORDER — ENOXAPARIN SODIUM 40 MG/0.4ML ~~LOC~~ SOLN
40.0000 mg | SUBCUTANEOUS | Status: DC
  Administered 2018-03-05 – 2018-03-06 (×2): 40 mg via SUBCUTANEOUS
  Filled 2018-03-05 (×2): qty 0.4

## 2018-03-05 MED ORDER — SIMVASTATIN 20 MG PO TABS
20.0000 mg | ORAL_TABLET | Freq: Every day | ORAL | Status: DC
  Administered 2018-03-05 – 2018-03-06 (×2): 20 mg via ORAL
  Filled 2018-03-05 (×2): qty 1

## 2018-03-05 MED ORDER — LORAZEPAM 2 MG/ML IJ SOLN
1.0000 mg | Freq: Once | INTRAMUSCULAR | Status: AC
  Administered 2018-03-05: 1 mg via INTRAVENOUS

## 2018-03-05 MED ORDER — SODIUM CHLORIDE 0.9 % IV SOLN
200.0000 mg | Freq: Two times a day (BID) | INTRAVENOUS | Status: DC
  Administered 2018-03-05 – 2018-03-07 (×5): 200 mg via INTRAVENOUS
  Filled 2018-03-05 (×6): qty 20

## 2018-03-05 NOTE — ED Provider Notes (Addendum)
[facility] EMERGENCY DEPARTMENT Provider Note   CSN: [id number] Arrival date & time: 03/05/18  1447     History   Chief Complaint Chief Complaint  Patient presents with  . Seizures    HPI Gerald Leach is a 84 y.o. male.  HPI Level 5 caveat due to altered mental status.  Called in to room for seizure.  Previous history of seizures but this was his fourth today.  Initially not able to provide much history.  Patient's "girlfriend" later was there and said that last seizure was a year ago.  He is on Keppra and Vimpat. Past Medical History:  Diagnosis Date  . Brain aneurysm   . Colonic polyp 2003  . Diabetes mellitus without complication ([place])   . ED (erectile dysfunction)   . GERD (gastroesophageal reflux disease)   . Hyperlipidemia   . Hypertension   . Hypothyroidism   . Seizures ([place])    per family  . Seizures [facility])     Patient Active Problem List   Diagnosis Date Noted  . Seizures ([place]) 03/05/2018  . Diabetic foot ulcers ([place]) 04/23/2017  . Parkinson disease ([place])   . Gait instability   . Diabetes mellitus type 2, controlled ([place]) 08/05/2015  . Hypothyroidism 08/05/2015  . Fall at home 10/02/2014  . CNS aneurysm s/p coiling ([facility], 2002) 10/02/2014  . High grade T7 central canal stenosis with T7 vertebral fracture s/p fall (02/2012) 10/02/2014  . 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 10/02/2014  . Syncope 02/05/2014  . Drug-induced delirium(292.81) 05/16/2013  . Seizure disorder ([place]) 04/21/2013  . Seizure disorder, grand mal ([place]) 03/24/2013  . Cervical compression fracture---C7 10/29/2012  . Dysphagia 09/21/2012  . Subdural hematoma ([place]) 09/20/2012  . SOLAR KERATOSIS 02/17/2009  . CARCINOMA, SKIN, SQUAMOUS CELL, FACE 04/19/2008  . Hyperlipidemia, group D 01/27/2008  . COLONIC POLYPS 11/15/2006  . GERD 11/15/2006    Past Surgical History:  Procedure Laterality Date  . Aneurysmal  clipping    . brain aneurysm surgery     at [facility]  . carotid artery aneurysm    . COLONOSCOPY     polyps  . CRANIOTOMY Right 09/19/2012   Procedure: CRANIOTOMY HEMATOMA EVACUATION SUBDURAL;  Surgeon: Otilio Connors, MD;  Location: [place];  Service: Neurosurgery;  Laterality: Right;  . CRANIOTOMY Right 09/22/2012   Procedure: CRANIOTOMY HEMATOMA EVACUATION SUBDURAL;  Surgeon: Otilio Connors, MD;  Location: [place] [facility] ORS;  Service: Neurosurgery;  Laterality: Right;  . Craniotomy.    . right hernia          Home Medications    Prior to Admission medications   Medication Sig Start Date End Date Taking? Authorizing Provider  amantadine (SYMMETREL) 100 MG capsule TAKE 1 CAPSULE DAILY 11/05/17  Yes Patel, Donika K, DO  ferrous sulfate 325 (65 FE) MG tablet Take 325 mg by mouth at bedtime.   Yes [provider]  levETIRAcetam (KEPPRA) 750 MG tablet Take 2 tablets (1,500 mg total) by mouth 2 (two) times daily. 07/05/17  Yes Patel, Arvin Collard K, DO  levothyroxine (SYNTHROID, LEVOTHROID) 75 MCG tablet TAKE 1 TABLET DAILY 10/29/17  Yes Nafziger, Tommi Rumps, NP  metFORMIN (GLUCOPHAGE) 500 MG tablet Take 1 tablet (500 mg total) by mouth 2 (two) times daily with a meal. 02/05/18  Yes Nafziger, Tommi Rumps, NP  Multiple Vitamins-Minerals (CENTRUM SILVER 50+MEN PO) Take 1 tablet by mouth every morning.   Yes [provider]  omeprazole ([place])  20 MG capsule TAKE 1 CAPSULE ON MONDAY,  WEDNESDAY, FRIDAY 03/04/18  Yes Nafziger, Tommi Rumps, NP  simvastatin (ZOCOR) 10 MG tablet TAKE 1 TABLET BY MOUTH EVERYDAY AT BEDTIME 10/01/17  Yes Nafziger, Cory, NP  VIMPAT 100 MG TABS TAKE 1 TABLET BY MOUTH TWICE A DAY 10/31/17  Yes Patel, Donika K, DO  vitamin B-12 (CYANOCOBALAMIN) 1000 MCG tablet Take 2,000 mcg by mouth at bedtime.   Yes [provider]  glucose blood (ONETOUCH VERIO) test strip Use once daily Dx E11.9 11/19/16   [name], NP  Lancets [place] ULTRASOFT) lancets Use as instructed 09/16/15    Nafziger, Tommi Rumps, NP  nystatin (MYCOSTATIN/NYSTOP) powder Apply topically 2 (two) times daily. Patient not taking: Reported on 03/05/2018 07/25/17   [name], NP    Family History Family History  Problem Relation Age of Onset  . Liver disease Brother        Died  . Seizures Neg Hx     Social History Social History   Tobacco Use  . Smoking status: Never Smoker  . Smokeless tobacco: Never Used  Substance Use Topics  . Alcohol use: Yes    Alcohol/week: 0.0 standard drinks    Comment: rum mixed in diet coke 3 a week  . Drug use: Yes    Types: Nitrous oxide     Allergies   Patient has no known allergies.   Review of Systems Review of Systems  Unable to perform ROS: Mental status change     Physical Exam Updated Vital Signs BP 131/72   Pulse 76   Temp 98.9 F (37.2 C) (Axillary)   Resp 19   Ht 6\' 1"  (1.854 m)   Wt 83.5 kg   SpO2 100%   BMI 24.29 kg/m   Physical Exam  Constitutional: He appears well-developed.  HENT:  Head: Atraumatic.  Cardiovascular: Normal rate.  Pulmonary/Chest: Effort normal.  Abdominal: There is no tenderness.  Musculoskeletal: He exhibits no edema.  Neurological:  Patient is postictal.  Confused.  Not really following commands.  Skin: Skin is warm. Capillary refill takes less than 2 seconds.     ED Treatments / Results  Labs (all labs ordered are listed, but only abnormal results are displayed) Labs Reviewed  COMPREHENSIVE METABOLIC PANEL - Abnormal; Notable for the following components:      Result Value   Glucose, Bld 159 (*)    BUN 7 (*)    AST 407 (*)    ALT 240 (*)    Alkaline Phosphatase 204 (*)    Total Bilirubin 2.7 (*)    All other components within normal limits  CBC - Abnormal; Notable for the following components:   WBC 18.9 (*)    All other components within normal limits  ACETAMINOPHEN LEVEL - Abnormal; Notable for the following components:   Acetaminophen (Tylenol), Serum <10 (*)    All other  components within normal limits  ETHANOL  URINALYSIS, ROUTINE W REFLEX MICROSCOPIC  CBC  BASIC METABOLIC PANEL  CBG MONITORING, ED    EKG None  Radiology Ct Head Wo Contrast  Result Date: 03/05/2018 CLINICAL DATA:  Seizure today. History of seizure, diabetes and brain aneurysm. EXAM: CT HEAD WITHOUT CONTRAST TECHNIQUE: Contiguous axial images were obtained from the base of the skull through the vertex without intravenous contrast. COMPARISON:  CT head 03/19/2017. FINDINGS: Brain: Imaging plane substandard due to physical condition. There is no evidence of acute intracranial hemorrhage, mass lesion, brain edema or extra-axial fluid collection. There is stable  atrophy with generalized prominence of the ventricles and subarachnoid spaces. Large right supraclinoid partially calcified aneurysm appears stable, measuring 2.6 x 1.6 cm on image 24/3. There is diffuse chronic periventricular white matter disease. There is no CT evidence of acute cortical infarction. Vascular: As above, stable large right supraclinoid ICA aneurysm. No hyperdense vessel identified. Skull: Remote right-sided craniotomy.  No acute osseous findings. Sinuses/Orbits: Stable small polyp medially in the left maxillary sinus. There is no mucosal thickening or air-fluid levels. The mastoid air cells and middle ears are clear. No orbital abnormalities are identified. There is some soft tissue emphysema in the left infratemporal fossa, most notable around the pterygoid musculature. Some air is also noted within the left internal jugular vein. No definite soft tissue inflammatory changes are identified. Other: None. IMPRESSION: 1. No evidence of acute intracranial process. 2. Stable atrophy, chronic small vessel ischemic changes and large right supraclinoid ICA aneurysm. 3. Prominent soft tissue emphysema in the left infratemporal fossa. This is more than typically seen incidentally from an intravenous line, although that is the likely  etiology. No definite surrounding inflammatory changes. Electronically Signed   By: Richardean Sale M.D.   On: 03/05/2018 15:58   Dg Chest Port 1 View  Result Date: 03/05/2018 CLINICAL DATA:  Seizure.  History of diabetes and hypertension. EXAM: PORTABLE CHEST 1 VIEW COMPARISON:  Radiographs 10/04/2014. FINDINGS: 1847 hours. Stable cardiomegaly and aortic atherosclerosis. There is chronic vascular congestion without overt pulmonary edema, confluent airspace opacity, pneumothorax or significant pleural effusion. Old rib fractures are again noted bilaterally. Multiple telemetry leads overlie the chest. IMPRESSION: Cardiomegaly with vascular congestion. No overt edema or confluent airspace opacity. Electronically Signed   By: Richardean Sale M.D.   On: 03/05/2018 19:11    Procedures Procedures (including critical care time)  Medications Ordered in ED Medications  lacosamide (VIMPAT) 200 mg in sodium chloride 0.9 % 25 mL IVPB (0 mg Intravenous Stopped 03/05/18 1623)  levothyroxine (SYNTHROID, LEVOTHROID) tablet 75 mcg (has no administration in time range)  simvastatin (ZOCOR) tablet 20 mg (has no administration in time range)  ferrous sulfate tablet 325 mg (has no administration in time range)  levETIRAcetam (KEPPRA) tablet 1,500 mg (has no administration in time range)  Lacosamide TABS 100 mg (has no administration in time range)  amantadine (SYMMETREL) capsule 100 mg (has no administration in time range)  0.9 %  sodium chloride infusion (has no administration in time range)  LORazepam (ATIVAN) injection 1-2 mg (has no administration in time range)  enoxaparin (LOVENOX) injection 40 mg (has no administration in time range)  sodium chloride flush (NS) 0.9 % injection 3 mL (has no administration in time range)  ondansetron (ZOFRAN) tablet 4 mg (has no administration in time range)    Or  ondansetron (ZOFRAN) injection 4 mg (has no administration in time range)  albuterol (PROVENTIL) (2.5 MG/3ML)  0.083% nebulizer solution 2.5 mg (has no administration in time range)  insulin aspart (novoLOG) injection 0-9 Units (has no administration in time range)  insulin aspart (novoLOG) injection 0-5 Units (has no administration in time range)  LORazepam (ATIVAN) injection 1 mg (1 mg Intravenous Given 03/05/18 1508)  levETIRAcetam (KEPPRA) IVPB 1500 mg/ 100 mL premix (0 mg Intravenous Stopped 03/05/18 1818)     Initial Impression / Assessment and Plan / ED Course  I have reviewed the triage vital signs and the nursing notes.  Pertinent labs & imaging results that were available during my care of the patient were reviewed by me and  considered in my medical decision making (see chart for details).     Patient with seizure.  History of same.  Had 4 episodes today.  Initially patient's girlfriend stated he had not taken his medicines this morning but family later said that it may have been a couple days.  Also drank some alcohol last night.  Initially given Vimpat then followed by Keppra.  Neurology recommends just restarting his oral medicines tomorrow.  However still not back at his baseline.  Likely just prolonged postictal period.  Mental status has been improving somewhat.  Neurology is seen patient recommends overnight watching.  Will admit to hospitalist.  Patient's LFTs are mildly elevated.  Alcohol and acetaminophen level added.  CRITICAL CARE Performed by: Davonna Belling Total critical care time: 30 minutes Critical care time was exclusive of separately billable procedures and treating other patients. Critical care was necessary to treat or prevent imminent or life-threatening deterioration. Critical care was time spent personally by me on the following activities: development of treatment plan with patient and/or surrogate as well as nursing, discussions with consultants, evaluation of patient's response to treatment, examination of patient, obtaining history from patient or surrogate,  ordering and performing treatments and interventions, ordering and review of laboratory studies, ordering and review of radiographic studies, pulse oximetry and re-evaluation of patient's condition.  Patient fell out of bed while in the room.  Reportedly hit lip.  There is a little laceration on the mucosal aspect of the upper lip to the right of midline.  Teeth appear stable.  No other severe trauma.  Does not appear to need imaging at this time.  Will be admitted to hospitalist for  Final Clinical Impressions(s) / ED Diagnoses   Final diagnoses:  Seizure [facility])    ED Discharge Orders    None       Davonna Belling, MD 03/05/18 Johnnye Lana    Davonna Belling, MD 03/05/18 [phone]
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
## 2018-03-05 NOTE — ED Notes (Signed)
Pt in postictal state and is sleeping. MD Pickering aware.

## 2018-03-05 NOTE — ED Notes (Addendum)
Pt placed in nonskid socks, arm band, fall risk pad, and on [place] alarm with family at bedside.

## 2018-03-05 NOTE — ED Notes (Addendum)
MD Alvino Chapel made aware that patient had seizure lasting about 30 seconds. Pt A&O x4.  Will continue to monitor. Seizure pads placed on bed rails.

## 2018-03-05 NOTE — ED Notes (Signed)
Pt found in floor by this RN with gown and IV off, pt lip bleeding but no other injuries apparent, pt not complaining of any pain, A&O at baseline as of post-ictal state. MD Alvino Chapel made aware.  Will continue to monitor.

## 2018-03-05 NOTE — ED Notes (Addendum)
Pt is currently post-ictal, but Pt's friend was informed that when Pt awakens and needs to use restroom, that we need to collect a urine sample. She verbalized understanding / urinal at bedside.

## 2018-03-05 NOTE — ED Triage Notes (Addendum)
Pt here via [place] after friend witnessed 2 seizures. Pt was lethargic and had a 3rd seizure in front of EMS that lasted 40 seconds.   Pt A&O x4 currently. CBG 161, 95% RA, 152/90, P 90-100 Afib.
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
## 2018-03-05 NOTE — ED Notes (Signed)
Pt's daughter-in-law is requesting that pt friend be asked to leave, per charge RN Roselyn Reef, neither one are blood related to the pt and it is not appropriate for friend to be asked to leave by daughter-in-law.  Friend is cooperative and attentive to pt's needs. Daughter-in-law sitting in hallway.  Will assess again if son shows up.

## 2018-03-06 LAB — URINALYSIS, ROUTINE W REFLEX MICROSCOPIC
Glucose, UA: NEGATIVE mg/dL
HGB URINE DIPSTICK: NEGATIVE
Ketones, ur: 20 mg/dL — AB
LEUKOCYTES UA: NEGATIVE
Nitrite: NEGATIVE
Protein, ur: 30 mg/dL — AB
Specific Gravity, Urine: 1.028 (ref 1.005–1.030)
pH: 6 (ref 5.0–8.0)

## 2018-03-06 LAB — BASIC METABOLIC PANEL
ANION GAP: 8 (ref 5–15)
BUN: 8 mg/dL (ref 8–23)
CHLORIDE: 107 mmol/L (ref 98–111)
CO2: 25 mmol/L (ref 22–32)
Calcium: 8.4 mg/dL — ABNORMAL LOW (ref 8.9–10.3)
Creatinine, Ser: 0.67 mg/dL (ref 0.61–1.24)
Glucose, Bld: 108 mg/dL — ABNORMAL HIGH (ref 70–99)
POTASSIUM: 3.5 mmol/L (ref 3.5–5.1)
SODIUM: 140 mmol/L (ref 135–145)

## 2018-03-06 LAB — GLUCOSE, CAPILLARY
GLUCOSE-CAPILLARY: 99 mg/dL (ref 70–99)
GLUCOSE-CAPILLARY: 95 mg/dL (ref 70–99)
Glucose-Capillary: 137 mg/dL — ABNORMAL HIGH (ref 70–99)
Glucose-Capillary: 122 mg/dL — ABNORMAL HIGH (ref 70–99)

## 2018-03-06 LAB — CBC
HCT: 43.3 % (ref 39.0–52.0)
Hemoglobin: 14.3 g/dL (ref 13.0–17.0)
MCH: 28.4 pg (ref 26.0–34.0)
MCHC: 33 g/dL (ref 30.0–36.0)
MCV: 86.1 fL (ref 78.0–100.0)
Platelets: 202 10*3/uL (ref 150–400)
RBC: 5.03 MIL/uL (ref 4.22–5.81)
RDW: 14.2 % (ref 11.5–15.5)
WBC: 12.8 10*3/uL — ABNORMAL HIGH (ref 4.0–10.5)

## 2018-03-06 NOTE — Evaluation (Signed)
Occupational Therapy Evaluation Patient Details Name: Gerald Leach MRN: [id number] DOB: [birth year] Today's Date: 03/06/2018    History of Present Illness Patient is am 84 y/o male presenting to the ED on 03/05/18 due to seizures. Patient with a PMH significant for HTN, DM type II, brain aneurysm s/p clipping, SDH s/p evacuation, seizure disorder, and hypothyroidism. Of note, patient fell in ED.   Clinical Impression   PTA patient reports independent with ADLs and mobility, daughter/daughter in law assist with IADLs and he reports only "heating food up in the microwave".  Unsure of patients cognitive baseline, but presents with decreased safety awareness, decreased short term memory, impaired problem solving and sequencing.  Requires setup assist for seated UB ADLs, min guard for grooming standing at sink, min assist for LB ADLs, and min assist for toilet transfers.  Required min assist for walker management and safety during short distance mobility, with L foot drag.  Based on performance today, recommend continued OT services while admitted and after dc at SNF level.  Will continue to follow acutely.      Follow Up Recommendations  SNF;Supervision/Assistance - 24 hour    Equipment Recommendations  Other (comment)(TBD at next venue of care)    Recommendations for Other Services       Precautions / Restrictions Precautions Precautions: Fall Restrictions Weight Bearing Restrictions: No      Mobility Bed Mobility Overal bed mobility: Needs Assistance Bed Mobility: Supine to Sit     Supine to sit: Min guard     General bed mobility comments: seated OOB in recliner   Transfers Overall transfer level: Needs assistance Equipment used: Rolling walker (2 wheeled) Transfers: Sit to/from Stand Sit to Stand: Min assist         General transfer comment: min assist for safety and balance    Balance Overall balance assessment: Needs assistance Sitting-balance support: No upper  extremity supported;Feet supported Sitting balance-Leahy Scale: Fair     Standing balance support: No upper extremity supported;During functional activity Standing balance-Leahy Scale: Poor Standing balance comment: reliant on external support                           ADL either performed or assessed with clinical judgement   ADL Overall ADL's : Needs assistance/impaired Eating/Feeding: Supervision/ safety;Set up;Sitting   Grooming: Min guard;Standing   Upper Body Bathing: Set up;Sitting   Lower Body Bathing: Minimal assistance;Sit to/from stand   Upper Body Dressing : Set up;Sitting   Lower Body Dressing: Minimal assistance;Sit to/from stand   Toilet Transfer: Ambulation;RW;Minimal assistance(simulated to recliner )   Toileting- Water quality scientist and Hygiene: Sit to/from stand;Minimal assistance       Functional mobility during ADLs: Minimal assistance;Rolling walker General ADL Comments: min assist to min guard for balance and safety, cueing throughout session for safety awareness, obstacle navigation, attention/awareness, and seqeuncing; completed ADLs and mobility in room with L LE foot drag and poor walker safety     Vision Baseline Vision/History: Wears glasses Wears Glasses: Reading only Patient Visual Report: No change from baseline Vision Assessment?: Yes Eye Alignment: Within Functional Limits Ocular Range of Motion: Within Functional Limits Tracking/Visual Pursuits: Requires cues, head turns, or add eye shifts to track Saccades: Additional eye shifts occurred during testing     Perception     Praxis      Pertinent Vitals/Pain Pain Assessment: No/denies pain     Hand Dominance Right   Extremity/Trunk Assessment Upper Extremity Assessment  Upper Extremity Assessment: Overall WFL for tasks assessed   Lower Extremity Assessment Lower Extremity Assessment: Defer to PT evaluation LLE Deficits / Details: poor foot clearance with mobility,  tends to drag L foot, seemingly reduced proprioception   Cervical / Trunk Assessment Cervical / Trunk Assessment: Kyphotic   Communication Communication Communication: HOH   Cognition Arousal/Alertness: Awake/alert Behavior During Therapy: WFL for tasks assessed/performed Overall Cognitive Status: No family/caregiver present to determine baseline cognitive functioning Area of Impairment: Following commands;Safety/judgement;Problem solving;Awareness;Attention                   Current Attention Level: Selective   Following Commands: Follows one step commands inconsistently;Follows one step commands with increased time Safety/Judgement: Decreased awareness of safety;Decreased awareness of deficits Awareness: Emergent Problem Solving: Slow processing;Decreased initiation;Difficulty sequencing;Requires verbal cues;Requires tactile cues General Comments: poor safety awareness, obstacle navigation, awareness of deficits   General Comments  unsure of true PLOF     Exercises     Shoulder Instructions      Home Living Family/patient expects to be discharged to:: Private residence Living Arrangements: Alone Available Help at Discharge: Family;Available PRN/intermittently Type of Home: House Home Access: Stairs to enter [employer] of Steps: (3-4) Entrance Stairs-Rails: (unsure) Home Layout: Multi-level;Able to live on main level with bedroom/bathroom Alternate Level Stairs-Number of Steps: 12 Alternate Level Stairs-Rails: Right Bathroom Shower/Tub: Teacher, early years/pre: Standard     Home Equipment: Grab bars - tub/shower;Cane - single point;Walker - 4 wheels   Additional Comments: unsure of actual use of AD      Prior Functioning/Environment Level of Independence: Needs assistance  Gait / Transfers Assistance Needed: ambulates with SPC vs rollator ADL's / Homemaking Assistance Needed: independent ADLs, reports daughter/vs in law helps with  household chores and groceries            OT Problem List: Decreased activity tolerance;Impaired balance (sitting and/or standing);Decreased cognition;Decreased safety awareness;Decreased knowledge of precautions;Decreased knowledge of use of DME or AE      OT Treatment/Interventions: Self-care/ADL training;Therapeutic exercise;DME and/or AE instruction;Balance training;Patient/family education;Therapeutic activities;Cognitive remediation/compensation;Neuromuscular education    OT Goals(Current goals can be found in the care plan section) Acute Rehab OT Goals Patient Stated Goal: none stated OT Goal Formulation: With patient Time For Goal Achievement: 03/20/18 Potential to Achieve Goals: Good  OT Frequency: Min 2X/week   Barriers to D/C:            Co-evaluation              AM-PAC PT "6 Clicks" Daily Activity     Outcome Measure Help from another person eating meals?: None Help from another person taking care of personal grooming?: A Little Help from another person toileting, which includes using toliet, bedpan, or urinal?: A Little Help from another person bathing (including washing, rinsing, drying)?: A Little Help from another person to put on and taking off regular upper body clothing?: None Help from another person to put on and taking off regular lower body clothing?: A Little 6 Click Score: 20   End of Session Equipment Utilized During Treatment: Gait belt;Rolling walker Nurse Communication: Mobility status;Precautions  Activity Tolerance: Patient tolerated treatment well Patient left: in chair;with call bell/phone within reach;with chair alarm set  OT Visit Diagnosis: Unsteadiness on feet (R26.81);Other abnormalities of gait and mobility (R26.89);Muscle weakness (generalized) (M62.81)                Time: 3016-0109 OT Time Calculation (min): 21 min Charges:  OT  General Charges $OT Visit: 1 Visit OT Evaluation $OT Eval Moderate Complexity: [place], [place] Acute Rehabilitation Services Pager [phone] Office [phone]   Delight Stare 03/06/2018, 11:15 AM

## 2018-03-06 NOTE — Evaluation (Signed)
Physical Therapy Evaluation Patient Details Name: Gerald Leach MRN: [id number] DOB: Jan 05, 1934 Today's Date: 03/06/2018   History of Present Illness  Patient is am 84 y/o male presenting to the ED on 03/05/18 due to seizures. Patient with a PMH significant for HTN, DM type II, brain aneurysm s/p clipping, SDH s/p evacuation, seizure disorder, and hypothyroidism. Of note, patient fell in ED.    Clinical Impression  Gerald Leach admitted with the above listed diagnosis. No family present - unsure of patients baseline cognitive status. Patient reports he lives alone in a multi-level home with intermittent family support. Patient today with poor safety awareness and required physical assist for transfers and mobility - up to Mod A. Patient with noted poor L foot clearance with gait with tendency to drag. PT to recommend SNF at discharge to progress safe functional mobility. PT to follow acutely.     Follow Up Recommendations SNF;Supervision/Assistance - 24 hour    Equipment Recommendations  Other (comment)(defer)    Recommendations for Other Services OT consult     Precautions / Restrictions Precautions Precautions: Fall Restrictions Weight Bearing Restrictions: No      Mobility  Bed Mobility Overal bed mobility: Needs Assistance Bed Mobility: Supine to Sit     Supine to sit: Min guard     General bed mobility comments: increased time and effort weith cueing for sequencing and posture  Transfers Overall transfer level: Needs assistance Equipment used: Rolling walker (2 wheeled) Transfers: Sit to/from Stand Sit to Stand: Mod assist         General transfer comment: Mod A to power up from bedside with patient pulling up from RW - heavy posterior lean in initial standing   Ambulation/Gait Ambulation/Gait assistance: Min assist Gait Distance (Feet): (hallway ambulation) Assistive device: Rolling walker (2 wheeled) Gait Pattern/deviations: Step-to pattern;Decreased step  length - left;Decreased stride length;Decreased weight shift to left;Drifts right/left;Trunk flexed Gait velocity: decreased   General Gait Details: poor safety awareness with device even with cueing, poor L foot clearance with tendency to drag L foot; Mod A for turning due to safety  Stairs            Wheelchair Mobility    Modified Rankin (Stroke Patients Only)       Balance Overall balance assessment: Needs assistance Sitting-balance support: No upper extremity supported;Feet supported Sitting balance-Leahy Scale: Fair     Standing balance support: Bilateral upper extremity supported;During functional activity Standing balance-Leahy Scale: Poor Standing balance comment: reliant on external support                             Pertinent Vitals/Pain Pain Assessment: No/denies pain    Home Living Family/patient expects to be discharged to:: Private residence Living Arrangements: Alone Available Help at Discharge: Family;Available PRN/intermittently Type of Home: House Home Access: Stairs to enter Entrance Stairs-Rails: (unsure) Entrance Stairs-Number of Steps: (3-4) Home Layout: Multi-level;Able to live on main level with bedroom/bathroom Home Equipment: Grab bars - tub/shower;Cane - single point;Walker - 4 wheels Additional Comments: unsure of actual use of AD    Prior Function Level of Independence: Needs assistance   Gait / Transfers Assistance Needed: ambulates with SPC vs rollator  ADL's / Homemaking Assistance Needed: reports daughter/vs in law helps with household chores and groceries        Hand Dominance        Extremity/Trunk Assessment   Upper Extremity Assessment Upper Extremity Assessment: Defer to OT evaluation  Lower Extremity Assessment Lower Extremity Assessment: Generalized weakness;LLE deficits/detail LLE Deficits / Details: poor foot clearance with mobility, tends to drag L foot, seemingly reduced proprioception     Cervical / Trunk Assessment Cervical / Trunk Assessment: Kyphotic  Communication   Communication: HOH  Cognition Arousal/Alertness: Awake/alert Behavior During Therapy: WFL for tasks assessed/performed Overall Cognitive Status: No family/caregiver present to determine baseline cognitive functioning Area of Impairment: Following commands;Safety/judgement;Problem solving                       Following Commands: Follows one step commands inconsistently;Follows one step commands with increased time Safety/Judgement: Decreased awareness of safety;Decreased awareness of deficits   Problem Solving: Slow processing;Decreased initiation;Difficulty sequencing;Requires verbal cues;Requires tactile cues General Comments: poor safety awareness, obstacle navigation, awareness of deficits      General Comments General comments (skin integrity, edema, etc.): unsure of true PLOF     Exercises     Assessment/Plan    PT Assessment Patient needs continued PT services  PT Problem List Decreased strength;Decreased activity tolerance;Decreased balance;Decreased mobility;Decreased knowledge of use of DME;Decreased safety awareness       PT Treatment Interventions DME instruction;Gait training;Stair training;Functional mobility training;Therapeutic activities;Therapeutic exercise;Balance training;Neuromuscular re-education;Patient/family education    PT Goals (Current goals can be found in the Care Plan section)  Acute Rehab PT Goals Patient Stated Goal: none stated PT Goal Formulation: With patient Time For Goal Achievement: 03/20/18 Potential to Achieve Goals: Fair    Frequency Min 3X/week   Barriers to discharge Decreased caregiver support reports he is alone during the day    Co-evaluation               AM-PAC PT "6 Clicks" Daily Activity  Outcome Measure Difficulty turning over in bed (including adjusting bedclothes, sheets and blankets)?: A Lot Difficulty moving  from lying on back to sitting on the side of the bed? : A Lot Difficulty sitting down on and standing up from a chair with arms (e.g., wheelchair, bedside commode, etc,.)?: Unable Help needed moving to and from a bed to chair (including a wheelchair)?: A Little Help needed walking in hospital room?: A Little Help needed climbing 3-5 steps with a railing? : A Lot 6 Click Score: 13    End of Session Equipment Utilized During Treatment: Gait belt Activity Tolerance: Patient tolerated treatment well Patient left: in chair;with call bell/phone within reach;with chair alarm set Nurse Communication: Mobility status PT Visit Diagnosis: Unsteadiness on feet (R26.81);Other abnormalities of gait and mobility (R26.89);Muscle weakness (generalized) (M62.81)    Time: 1610-9604 PT Time Calculation (min) (ACUTE ONLY): 29 min   Charges:   PT Evaluation $PT Eval Moderate Complexity: 1 Mod        Lanney Gins, PT, DPT Supplemental Physical Therapist 03/06/18 10:13 AM Pager: [phone] Office: [phone]

## 2018-03-06 NOTE — Progress Notes (Addendum)
Subjective: Patient is alert, oriented, bradyphrenic, with no further seizures overnight.  Exam: Vitals:   03/06/18 0328 03/06/18 0857  BP: 122/61 (!) 122/94  Pulse: 69 85  Resp: 18 20  Temp: 98.3 F (36.8 C) 97.8 F (36.6 C)  SpO2: 98% 97%    Physical Exam   HEENT-  Normocephalic, no lesions, without obvious abnormality.  Normal external eye and conjunctiva.   Extremities- Warm, dry and intact Musculoskeletal-no joint tenderness, deformity or swelling Skin-warm and dry, no hyperpigmentation, vitiligo, or suspicious lesions    Neuro:  Mental Status: Alert, oriented, thought content appropriate.  Speech bradyphrenic, fluent without evidence of aphasia.  Able to follow 3 step commands without difficulty. Cranial Nerves: II:  Visual fields grossly normal,  III,IV, VI: ptosis not present, extra-ocular motions intact bilaterally pupils equal, round, reactive to light and accommodation V,VII: smile symmetric, facial light touch sensation normal bilaterally VIII: hearing decreased bilaterally--baseline Motor: Moving all extremities antigravity Sensory: Pinprick and light touch intact throughout, bilaterally     Medications:  Prior to Admission:  Medications Prior to Admission  Medication Sig Dispense Refill Last Dose  . amantadine (SYMMETREL) 100 MG capsule TAKE 1 CAPSULE DAILY 90 capsule 3 03/04/2018 at Unknown time  . ferrous sulfate 325 (65 FE) MG tablet Take 325 mg by mouth at bedtime.   Past Week at Unknown time  . levETIRAcetam (KEPPRA) 750 MG tablet Take 2 tablets (1,500 mg total) by mouth 2 (two) times daily. 360 tablet 3 03/04/2018 at Unknown time  . levothyroxine (SYNTHROID, LEVOTHROID) 75 MCG tablet TAKE 1 TABLET DAILY 90 tablet 3 03/04/2018 at Unknown time  . metFORMIN (GLUCOPHAGE) 500 MG tablet Take 1 tablet (500 mg total) by mouth 2 (two) times daily with a meal. 180 tablet 0 03/04/2018 at Unknown time  . Multiple Vitamins-Minerals (CENTRUM SILVER 50+MEN PO) Take 1  tablet by mouth every morning.   03/04/2018 at Unknown time  . omeprazole (PRILOSEC) 20 MG capsule TAKE 1 CAPSULE ON MONDAY,  WEDNESDAY, FRIDAY 39 capsule 1 03/03/2018 at Unknown time  . simvastatin (ZOCOR) 10 MG tablet TAKE 1 TABLET BY MOUTH EVERYDAY AT BEDTIME 90 tablet 2 03/03/2018  . VIMPAT 100 MG TABS TAKE 1 TABLET BY MOUTH TWICE A DAY 180 tablet 1 03/04/2018 at Unknown time  . vitamin B-12 (CYANOCOBALAMIN) 1000 MCG tablet Take 2,000 mcg by mouth at bedtime.   03/03/2018  . glucose blood (ONETOUCH VERIO) test strip Use once daily Dx E11.9 100 each 12 Taking  . Lancets (ONETOUCH ULTRASOFT) lancets Use as instructed 100 each 12 Taking  . nystatin (MYCOSTATIN/NYSTOP) powder Apply topically 2 (two) times daily. (Patient not taking: Reported on 03/05/2018) 45 g 3 Not Taking at Unknown time   Scheduled: . amantadine  100 mg Oral Daily  . enoxaparin (LOVENOX) injection  40 mg Subcutaneous Q24H  . ferrous sulfate  325 mg Oral QHS  . insulin aspart  0-5 Units Subcutaneous QHS  . insulin aspart  0-9 Units Subcutaneous TID WC  . levETIRAcetam  1,500 mg Oral BID  . levothyroxine  75 mcg Oral QAC breakfast  . simvastatin  20 mg Oral q1800  . sodium chloride flush  3 mL Intravenous Q12H   Continuous: . sodium chloride 75 mL/hr (03/05/18 2157)  . lacosamide (VIMPAT) IV 200 mg (03/05/18 2207)    Pertinent Labs/Diagnostics: Patient's WBC has decreased from 18.9 to 12.8  Ct Head Wo Contrast  Result Date: 03/05/2018  IMPRESSION: 1. No evidence of acute intracranial process. 2. Stable atrophy,  chronic small vessel ischemic changes and large right supraclinoid ICA aneurysm. 3. Prominent soft tissue emphysema in the left infratemporal fossa. This is more than typically seen incidentally from an intravenous line, although that is the likely etiology. No definite surrounding inflammatory changes. Electronically Signed   By: Richardean Sale M.D.   On: 03/05/2018 15:58      Etta Quill PA-C [facility] [phone]   Assessment: 84 year old male with breakthrough seizure secondary to missing 2 doses of his antiepileptic medication in addition to drinking the night before.  At this time he has been placed back on his antiepileptic medication and has had no further seizures  Impression: Breakthrough seizure   Recommendations: - Remain on home dose of antiepileptic medications, do not miss any doses - Abstain from alcohol use -Per [facility] statutes, patients with seizures are not allowed to drive until  they have been seizure-free for six months. Use caution when using heavy equipment or power tools. Avoid working on ladders or at heights. Take showers instead of baths. Ensure the water temperature is not too high on the home water heater. Do not go swimming alone. When caring for infants or small children, sit down when holding, feeding, or changing them to minimize risk of injury to the child in the event you have a seizure.   Also, Maintain good sleep hygiene. Avoid alcohol.   -At this time neurology will sign off. - Patient is to follow-up with his outpatient neurologist  03/06/2018, 9:29 AM   NEUROHOSPITALIST ADDENDUM Performed a face to face diagnostic evaluation.  Patient is alert and oriented.  Admits that he forgot to take medication when he was visiting his girlfriend.  Agreed that he consume alcohol. Appears back to his baseline.  Resume home medications.  I have reviewed the contents of history and physical exam as documented by PA/ARNP/Resident and agree with above documentation.  I have discussed and formulated the above plan as documented. Edits to the note have been made as needed.    Karena Addison Aroor MD [facility] [phone]   If 7pm to 7am, please call on call as listed on AMION.

## 2018-03-06 NOTE — Progress Notes (Signed)
PROGRESS NOTE    Gerald Leach  TDV:[id number] DOB: Sep 26, 1933 DOA: 03/05/2018 PCP: [name], NP  Outpatient Specialists:   Brief Narrative:  Gerald Leach is 84 year old male with past medical history significant for hypertension, DM type II, brain aneurysm s/p clipping, SDH s/p evacuation, seizure disorder, and hypothyroidism.  Patient was admitted with repeated seizures after having missed his seizure medications for 2 days, couple with alcohol consumption.  Patient was brought to the hospital after reportedly having 3 seizures prior to arrival.  At baseline patient reportedly is unsteady on his feet but lives alone and is supposed to utilize a rolling walker but does not.  Neurology input is highly appreciated.  Assessment & Plan:   Principal Problem:   Seizures ([place]) Active Problems:   Leukocytosis  Seizures:  Patient reportedly had 4 seizures today suspected to be related to noncompliance and alcohol use.   Seizures thought to have started after CNS aneurysm with SDH requiring evacuation and clipping in 2014. Neurology was consulted. Patient received 200 mg of Vimpat and 1500 mg of Keppra IV while in the ED.  - Seizure precautions  - Ativan prn seizure activity - IV fluids normal saline at 75 mL per - Restart home medications Vimpat and Keppra in a.m. - PT/OT to eval and treat: Skilled nursing facility recommended - Social work for possible need for placement/rehab as lives alone  Leukocytosis:  WBC elevated at 18.2.  WBC this morning, 03/06/2018 was 12.8.   Chest x-ray otherwise negative for signs of aspiration. WBC likely elevated due to seizures  -Repeat chest x-ray in the morning.    Diabetes mellitus type 2:  Last hemoglobin A1c noted to be 6.2 on 07/29/2017. -Continue to monitor closely.  Hypothyroidism:  Last TSH noted to be 4.68 on 10/31/2017. - TSH was 2.341.   - Continue levothyroxine  Hyperlipidemia: - Continue simvastatin  DVT prophylaxis:  lovenoc Code Status: Full  Family Communication:  Disposition Plan:  Skilled nursing facility recommended.    Consultants:   Neurology  Procedures:   None  Antimicrobials:   None.   Subjective: No new complaints. Patient is not a particularly good historian.  Objective: Vitals:   03/05/18 2314 03/06/18 0328 03/06/18 0857 03/06/18 1631  BP: 116/81 122/61 (!) 122/94 134/75  Pulse: 66 69 85 75  Resp: 20 18 20 20   Temp: 98 F (36.7 C) 98.3 F (36.8 C) 97.8 F (36.6 C) 98.1 F (36.7 C)  TempSrc: Oral Oral Oral Oral  SpO2: 98% 98% 97% 99%  Weight:      Height:        Intake/Output Summary (Last 24 hours) at 03/06/2018 1725 Last data filed at 03/06/2018 1316 Gross per 24 hour  Intake 720 ml  Output 600 ml  Net 120 ml   Filed Weights   03/05/18 1453 03/05/18 2055  Weight: 83.5 kg 83.5 kg    Examination:  General exam: Appears calm and comfortable  Respiratory system: Clear to auscultation.  Cardiovascular system: S1 & S2 heard Gastrointestinal system: Abdomen is nondistended, soft and nontender. No organomegaly or masses felt. Normal bowel sounds heard. Central nervous system: Awake and alert.  Moves all limbs.   Extremities: No leg edema.    Data Reviewed: I have personally reviewed following labs and imaging studies  CBC: Recent Labs  Lab 03/05/18 1455 03/06/18 0305  WBC 18.9* 12.8*  HGB 15.2 14.3  HCT 47.6 43.3  MCV 87.8 86.1  PLT 227 062   Basic Metabolic Panel:  Recent Labs  Lab 03/05/18 1455 03/06/18 0305  NA 139 140  K 4.0 3.5  CL 103 107  CO2 25 25  GLUCOSE 159* 108*  BUN 7* 8  CREATININE 0.94 0.67  CALCIUM 8.9 8.4*   GFR: Estimated Creatinine Clearance: 80.5 mL/min (by C-G formula based on SCr of 0.67 mg/dL). Liver Function Tests: Recent Labs  Lab 03/05/18 1455  AST 407*  ALT 240*  ALKPHOS 204*  BILITOT 2.7*  PROT 7.2  ALBUMIN 3.5   No results for input(s): LIPASE, AMYLASE in the last 168 hours. No results for  input(s): AMMONIA in the last 168 hours. Coagulation Profile: No results for input(s): INR, PROTIME in the last 168 hours. Cardiac Enzymes: No results for input(s): CKTOTAL, CKMB, CKMBINDEX, TROPONINI in the last 168 hours. BNP (last 3 results) No results for input(s): PROBNP in the last 8760 hours. HbA1C: No results for input(s): HGBA1C in the last 72 hours. CBG: Recent Labs  Lab 03/05/18 1949 03/05/18 2105 03/06/18 0604 03/06/18 1140 03/06/18 1628  GLUCAP 117* 107* 99 137* 95   Lipid Profile: No results for input(s): CHOL, HDL, LDLCALC, TRIG, CHOLHDL, LDLDIRECT in the last 72 hours. Thyroid Function Tests: Recent Labs    03/05/18 2051  TSH 2.341   Anemia Panel: No results for input(s): VITAMINB12, FOLATE, FERRITIN, TIBC, IRON, RETICCTPCT in the last 72 hours. Urine analysis:    Component Value Date/Time   COLORURINE AMBER (A) 03/05/2018 1122   APPEARANCEUR HAZY (A) 03/05/2018 1122   LABSPEC 1.028 03/05/2018 1122   PHURINE 6.0 03/05/2018 1122   GLUCOSEU NEGATIVE 03/05/2018 1122   HGBUR NEGATIVE 03/05/2018 1122   HGBUR trace-intact 02/22/2010 0850   BILIRUBINUR MODERATE (A) 03/05/2018 1122   BILIRUBINUR 1+ 06/20/2016 1251   KETONESUR 20 (A) 03/05/2018 1122   PROTEINUR 30 (A) 03/05/2018 1122   UROBILINOGEN 0.2 06/20/2016 1251   UROBILINOGEN 1.0 10/02/2014 2242   NITRITE NEGATIVE 03/05/2018 1122   LEUKOCYTESUR NEGATIVE 03/05/2018 1122   Sepsis Labs: @LABRCNTIP (procalcitonin:4,lacticidven:4)  )No results found for this or any previous visit (from the past 240 hour(s)).       Radiology Studies: Ct Head Wo Contrast  Result Date: 03/05/2018 CLINICAL DATA:  Seizure today. History of seizure, diabetes and brain aneurysm. EXAM: CT HEAD WITHOUT CONTRAST TECHNIQUE: Contiguous axial images were obtained from the base of the skull through the vertex without intravenous contrast. COMPARISON:  CT head 03/19/2017. FINDINGS: Brain: Imaging plane substandard due to physical  condition. There is no evidence of acute intracranial hemorrhage, mass lesion, brain edema or extra-axial fluid collection. There is stable atrophy with generalized prominence of the ventricles and subarachnoid spaces. Large right supraclinoid partially calcified aneurysm appears stable, measuring 2.6 x 1.6 cm on image 24/3. There is diffuse chronic periventricular white matter disease. There is no CT evidence of acute cortical infarction. Vascular: As above, stable large right supraclinoid ICA aneurysm. No hyperdense vessel identified. Skull: Remote right-sided craniotomy.  No acute osseous findings. Sinuses/Orbits: Stable small polyp medially in the left maxillary sinus. There is no mucosal thickening or air-fluid levels. The mastoid air cells and middle ears are clear. No orbital abnormalities are identified. There is some soft tissue emphysema in the left infratemporal fossa, most notable around the pterygoid musculature. Some air is also noted within the left internal jugular vein. No definite soft tissue inflammatory changes are identified. Other: None. IMPRESSION: 1. No evidence of acute intracranial process. 2. Stable atrophy, chronic small vessel ischemic changes and large right supraclinoid ICA aneurysm. 3.  Prominent soft tissue emphysema in the left infratemporal fossa. This is more than typically seen incidentally from an intravenous line, although that is the likely etiology. No definite surrounding inflammatory changes. Electronically Signed   By: Richardean Sale M.D.   On: 03/05/2018 15:58   Dg Chest Port 1 View  Result Date: 03/05/2018 CLINICAL DATA:  Seizure.  History of diabetes and hypertension. EXAM: PORTABLE CHEST 1 VIEW COMPARISON:  Radiographs 10/04/2014. FINDINGS: 1847 hours. Stable cardiomegaly and aortic atherosclerosis. There is chronic vascular congestion without overt pulmonary edema, confluent airspace opacity, pneumothorax or significant pleural effusion. Old rib fractures are  again noted bilaterally. Multiple telemetry leads overlie the chest. IMPRESSION: Cardiomegaly with vascular congestion. No overt edema or confluent airspace opacity. Electronically Signed   By: Richardean Sale M.D.   On: 03/05/2018 19:11        Scheduled Meds: . amantadine  100 mg Oral Daily  . enoxaparin (LOVENOX) injection  40 mg Subcutaneous Q24H  . ferrous sulfate  325 mg Oral QHS  . insulin aspart  0-5 Units Subcutaneous QHS  . insulin aspart  0-9 Units Subcutaneous TID WC  . levETIRAcetam  1,500 mg Oral BID  . levothyroxine  75 mcg Oral QAC breakfast  . simvastatin  20 mg Oral q1800  . sodium chloride flush  3 mL Intravenous Q12H   Continuous Infusions: . sodium chloride 75 mL/hr (03/06/18 1153)  . lacosamide (VIMPAT) IV 200 mg (03/06/18 1019)     LOS: 0 days    Time spent: 30 minutes.    Dana Allan, MD  [facility] Pager #: [phone] 7PM-7AM contact night coverage as above

## 2018-03-07 ENCOUNTER — Inpatient Hospital Stay (HOSPITAL_COMMUNITY): Payer: Medicare HMO

## 2018-03-07 DIAGNOSIS — E783 Hyperchylomicronemia: Secondary | ICD-10-CM

## 2018-03-07 DIAGNOSIS — R2681 Unsteadiness on feet: Secondary | ICD-10-CM

## 2018-03-07 DIAGNOSIS — E119 Type 2 diabetes mellitus without complications: Secondary | ICD-10-CM

## 2018-03-07 DIAGNOSIS — E032 Hypothyroidism due to medicaments and other exogenous substances: Secondary | ICD-10-CM

## 2018-03-07 DIAGNOSIS — G40909 Epilepsy, unspecified, not intractable, without status epilepticus: Principal | ICD-10-CM

## 2018-03-07 LAB — CBC WITH DIFFERENTIAL/PLATELET
Abs Immature Granulocytes: 0 10*3/uL (ref 0.0–0.1)
Basophils Absolute: 0 10*3/uL (ref 0.0–0.1)
Basophils Relative: 0 %
Eosinophils Absolute: 0 10*3/uL (ref 0.0–0.7)
Eosinophils Relative: 0 %
HCT: 40.6 % (ref 39.0–52.0)
Hemoglobin: 13.5 g/dL (ref 13.0–17.0)
Immature Granulocytes: 0 %
Lymphocytes Relative: 10 %
Lymphs Abs: 1 10*3/uL (ref 0.7–4.0)
MCH: 28.4 pg (ref 26.0–34.0)
MCHC: 33.3 g/dL (ref 30.0–36.0)
MCV: 85.3 fL (ref 78.0–100.0)
Monocytes Absolute: 0.8 10*3/uL (ref 0.1–1.0)
Monocytes Relative: 8 %
Neutro Abs: 7.9 10*3/uL — ABNORMAL HIGH (ref 1.7–7.7)
Neutrophils Relative %: 82 %
Platelets: 163 10*3/uL (ref 150–400)
RBC: 4.76 MIL/uL (ref 4.22–5.81)
RDW: 14.2 % (ref 11.5–15.5)
WBC: 9.8 10*3/uL (ref 4.0–10.5)

## 2018-03-07 LAB — RENAL FUNCTION PANEL
Albumin: 2.8 g/dL — ABNORMAL LOW (ref 3.5–5.0)
Anion gap: 9 (ref 5–15)
BUN: 8 mg/dL (ref 8–23)
CO2: 20 mmol/L — ABNORMAL LOW (ref 22–32)
Calcium: 7.9 mg/dL — ABNORMAL LOW (ref 8.9–10.3)
Chloride: 106 mmol/L (ref 98–111)
Creatinine, Ser: 0.67 mg/dL (ref 0.61–1.24)
GFR calc Af Amer: >60 mL/min (ref >60)
GFR calc non Af Amer: >60 mL/min (ref >60)
Glucose, Bld: 127 mg/dL — ABNORMAL HIGH (ref 70–99)
Phosphorus: 1.9 mg/dL — ABNORMAL LOW (ref 2.5–4.6)
Potassium: 3.4 mmol/L — ABNORMAL LOW (ref 3.5–5.1)
Sodium: 135 mmol/L (ref 135–145)

## 2018-03-07 LAB — GLUCOSE, CAPILLARY
GLUCOSE-CAPILLARY: 118 mg/dL — AB (ref 70–99)
Glucose-Capillary: 149 mg/dL — ABNORMAL HIGH (ref 70–99)

## 2018-03-07 LAB — MAGNESIUM: Magnesium: 1.8 mg/dL (ref 1.7–2.4)

## 2018-03-07 MED ORDER — K PHOS MONO-SOD PHOS DI & MONO 155-852-130 MG PO TABS
500.0000 mg | ORAL_TABLET | Freq: Two times a day (BID) | ORAL | Status: DC
  Administered 2018-03-07: 500 mg via ORAL
  Filled 2018-03-07: qty 2

## 2018-03-07 NOTE — Progress Notes (Signed)
NURSING PROGRESS NOTE  Gerald Leach [id number] Discharge Data: 03/07/2018 5:00 PM Attending Provider: Patrecia Pour, MD CBS:[id number], Tommi Rumps, NP     [name] to be D/C'd Home per MD order.  Discussed with the patient the After Visit Summary and all questions fully answered. All IV's discontinued with no bleeding noted. All belongings returned to patient for patient to take home.   Last Vital Signs:  Blood pressure 126/69, pulse 90, temperature 98.2 F (36.8 C), temperature source Oral, resp. rate 18, height 6\' 1"  (1.854 m), weight 83.5 kg, SpO2 95 %.  Discharge Medication List Allergies as of 03/07/2018   No Known Allergies     Medication List    STOP taking these medications   simvastatin 10 MG tablet Commonly known as:  ZOCOR     TAKE these medications   amantadine 100 MG capsule Commonly known as:  SYMMETREL TAKE 1 CAPSULE DAILY   CENTRUM SILVER 50+MEN PO Take 1 tablet by mouth every morning.   ferrous sulfate 325 (65 FE) MG tablet Take 325 mg by mouth at bedtime.   glucose blood test strip Use once daily Dx E11.9   levETIRAcetam 750 MG tablet Commonly known as:  KEPPRA Take 2 tablets (1,500 mg total) by mouth 2 (two) times daily.   levothyroxine 75 MCG tablet Commonly known as:  SYNTHROID, LEVOTHROID TAKE 1 TABLET DAILY   metFORMIN 500 MG tablet Commonly known as:  GLUCOPHAGE Take 1 tablet (500 mg total) by mouth 2 (two) times daily with a meal.   nystatin powder Commonly known as:  MYCOSTATIN/NYSTOP Apply topically 2 (two) times daily.   omeprazole 20 MG capsule Commonly known as:  PRILOSEC TAKE 1 CAPSULE ON MONDAY,  WEDNESDAY, FRIDAY   onetouch ultrasoft lancets Use as instructed   VIMPAT 100 MG Tabs Generic drug:  Lacosamide TAKE 1 TABLET BY MOUTH TWICE A DAY   vitamin B-12 1000 MCG tablet Commonly known as:  CYANOCOBALAMIN Take 2,000 mcg by mouth at bedtime.

## 2018-03-07 NOTE — Progress Notes (Signed)
CSW acknowledging recommendation for SNF placement. CSW met with patient to discuss. Patient refusing SNF Placement at this time; says he has been at SNF before and didn't feel like it did anything for him. Patient says he has caregivers that come and check on him to help him almost every day, and that they walk with him and help him with his therapies. Patient did acknowledge that he would like to walk better and move his legs better, especially being able to bend them higher, so patient was agreeable to setting up home health after some discussion. Patient thinks he's used [facility] in the past. CSW alerted [facility] and MD.  CSW signing off.  [name], [place] Clinical Social Worker [phone]

## 2018-03-07 NOTE — Progress Notes (Signed)
PROGRESS NOTE  Gerald Leach  LYY:[id number] DOB: September 13, 1933 DOA: 03/05/2018 PCP: [name], NP   Brief Narrative: Gerald Leach is an 84 y.o. male with a history of brain aneurysm s/p coiling, SDH s/p evacuation, seizure disorder, HTN, T2DM and hypothyroidism who presented 9/18 with a breakthrough seizure in the setting of missing AED doses and drinking alcohol. Neurology was consulted, recommending restart of home medications, abstinence from alcohol, and ongoing seizure precautions including driving restrictions. The patient lives alone and is unsteady on his feet, displaying impulsivity, but at mental baseline. Physical therapy has recommended SNF disposition.   Assessment & Plan: Principal Problem:   Seizures ([place]) Active Problems:   Leukocytosis  Breakthrough seizure, seizure disorder, medication nonadherence:  - Loaded with AED in ED, no seizures since. Restarted home AEDs. Neurology has signed off, recommending continuing this regimen and abstaining from alcohol. - Standard seizure precautions reviewed - No further evaluation planned.   Impulsivity, gait imbalance, possible parkinson's disease: - Pt was started on amantadine in 2017 for left rigidity, masked facies and bradykinesia, hypophionia, gait instability. Will continue this. - Supposed to use rolling walker at home but does not. SNF recommended. Pt deciding whether he will agree to this or go home. CSW consulted.   Hypokalemia, hypophosphatemia:  - Replace today po.  Leukocytosis: Reactive to seizures most likely. CXR negative, and again negative on repeat. No nidus for infection found and WBC normalized without antimicrobial Tx.  - No further evaluation planned.   History of cerebral aneurysm s/p coiling, SDH s/p evacuation complicated by seizure disorder:  - As above  T2DM: Historically has been well controlled with HbA1c 6.2%  - Monitor, SSI at inpatient goal  Hypothyroidism: TSH wnl at 2.341.  - Continue  synthroid  Hyperlipidemia:  - Continue statin  DVT prophylaxis: Lovenox Code Status: Full Family Communication: None at bedside Disposition Plan: SNF recommended.   Consultants:   Neurology  Procedures:   None  Antimicrobials:  None   Subjective: Pt feels back to baseline, wants to go home. He's hungry. Has sitter due to impulsivity, getting up despite being significant fall risk. No seizures noted.   Objective: Vitals:   03/06/18 2016 03/07/18 0002 03/07/18 0500 03/07/18 0924  BP: (!) 144/90 (!) 151/91 (!) 149/88 126/69  Pulse: 81 92 82 90  Resp: 18 14 20 18   Temp: 98.5 F (36.9 C) 98.5 F (36.9 C) 98.6 F (37 C) 98.2 F (36.8 C)  TempSrc: Oral Oral Axillary Oral  SpO2: 95% 93% 94% 95%  Weight:      Height:        Intake/Output Summary (Last 24 hours) at 03/07/2018 1213 Last data filed at 03/07/2018 0900 Gross per 24 hour  Intake 1290 ml  Output 1750 ml  Net -460 ml   Filed Weights   03/05/18 1453 03/05/18 2055  Weight: 83.5 kg 83.5 kg    Gen: Elderly male in no distress Pulm: Non-labored breathing. Clear to auscultation bilaterally.  CV: Regular rate and rhythm. No murmur, rub, or gallop. No JVD, no pedal edema. GI: Abdomen soft, non-tender, non-distended, with normoactive bowel sounds. No organomegaly or masses felt. Ext: Warm, no deformities Skin: No rashes, lesions or ulcers. Ecchymosis small on upper lip near midline.  Neuro: Alert, oriented to person, place, situation, month. At mental baseline. Has 5/5 strength, intact sensation throughout. Psych: Judgement and insight appear fair. Mood euthymic & affect abnormal.   Data Reviewed: I have personally reviewed following labs and imaging studies  CBC: Recent Labs  Lab 03/05/18 1455 03/06/18 0305 03/07/18 0814  WBC 18.9* 12.8* 9.8  NEUTROABS  --   --  7.9*  HGB 15.2 14.3 13.5  HCT 47.6 43.3 40.6  MCV 87.8 86.1 85.3  PLT 227 202 063   Basic Metabolic Panel: Recent Labs  Lab  03/05/18 1455 03/06/18 0305 03/07/18 0814  NA 139 140 135  K 4.0 3.5 3.4*  CL 103 107 106  CO2 25 25 20*  GLUCOSE 159* 108* 127*  BUN 7* 8 8  CREATININE 0.94 0.67 0.67  CALCIUM 8.9 8.4* 7.9*  MG  --   --  1.8  PHOS  --   --  1.9*   GFR: Estimated Creatinine Clearance: 80.5 mL/min (by C-G formula based on SCr of 0.67 mg/dL). Liver Function Tests: Recent Labs  Lab 03/05/18 1455 03/07/18 0814  AST 407*  --   ALT 240*  --   ALKPHOS 204*  --   BILITOT 2.7*  --   PROT 7.2  --   ALBUMIN 3.5 2.8*   No results for input(s): LIPASE, AMYLASE in the last 168 hours. No results for input(s): AMMONIA in the last 168 hours. Coagulation Profile: No results for input(s): INR, PROTIME in the last 168 hours. Cardiac Enzymes: No results for input(s): CKTOTAL, CKMB, CKMBINDEX, TROPONINI in the last 168 hours. BNP (last 3 results) No results for input(s): PROBNP in the last 8760 hours. HbA1C: No results for input(s): HGBA1C in the last 72 hours. CBG: Recent Labs  Lab 03/06/18 1140 03/06/18 1628 03/06/18 2017 03/07/18 0632 03/07/18 1119  GLUCAP 137* 95 122* 118* 149*   Lipid Profile: No results for input(s): CHOL, HDL, LDLCALC, TRIG, CHOLHDL, LDLDIRECT in the last 72 hours. Thyroid Function Tests: Recent Labs    03/05/18 2051  TSH 2.341   Anemia Panel: No results for input(s): VITAMINB12, FOLATE, FERRITIN, TIBC, IRON, RETICCTPCT in the last 72 hours. Urine analysis:    Component Value Date/Time   COLORURINE AMBER (A) 03/05/2018 1122   APPEARANCEUR HAZY (A) 03/05/2018 1122   LABSPEC 1.028 03/05/2018 1122   PHURINE 6.0 03/05/2018 1122   GLUCOSEU NEGATIVE 03/05/2018 1122   HGBUR NEGATIVE 03/05/2018 1122   HGBUR trace-intact 02/22/2010 0850   BILIRUBINUR MODERATE (A) 03/05/2018 1122   BILIRUBINUR 1+ 06/20/2016 1251   KETONESUR 20 (A) 03/05/2018 1122   PROTEINUR 30 (A) 03/05/2018 1122   UROBILINOGEN 0.2 06/20/2016 1251   UROBILINOGEN 1.0 10/02/2014 2242   NITRITE  NEGATIVE 03/05/2018 1122   LEUKOCYTESUR NEGATIVE 03/05/2018 1122   No results found for this or any previous visit (from the past 240 hour(s)).    Radiology Studies: Ct Head Wo Contrast  Result Date: 03/05/2018 CLINICAL DATA:  Seizure today. History of seizure, diabetes and brain aneurysm. EXAM: CT HEAD WITHOUT CONTRAST TECHNIQUE: Contiguous axial images were obtained from the base of the skull through the vertex without intravenous contrast. COMPARISON:  CT head 03/19/2017. FINDINGS: Brain: Imaging plane substandard due to physical condition. There is no evidence of acute intracranial hemorrhage, mass lesion, brain edema or extra-axial fluid collection. There is stable atrophy with generalized prominence of the ventricles and subarachnoid spaces. Large right supraclinoid partially calcified aneurysm appears stable, measuring 2.6 x 1.6 cm on image 24/3. There is diffuse chronic periventricular white matter disease. There is no CT evidence of acute cortical infarction. Vascular: As above, stable large right supraclinoid ICA aneurysm. No hyperdense vessel identified. Skull: Remote right-sided craniotomy.  No acute osseous findings. Sinuses/Orbits: Stable small polyp  medially in the left maxillary sinus. There is no mucosal thickening or air-fluid levels. The mastoid air cells and middle ears are clear. No orbital abnormalities are identified. There is some soft tissue emphysema in the left infratemporal fossa, most notable around the pterygoid musculature. Some air is also noted within the left internal jugular vein. No definite soft tissue inflammatory changes are identified. Other: None. IMPRESSION: 1. No evidence of acute intracranial process. 2. Stable atrophy, chronic small vessel ischemic changes and large right supraclinoid ICA aneurysm. 3. Prominent soft tissue emphysema in the left infratemporal fossa. This is more than typically seen incidentally from an intravenous line, although that is the likely  etiology. No definite surrounding inflammatory changes. Electronically Signed   By: Richardean Sale M.D.   On: 03/05/2018 15:58   Dg Chest Port 1 View  Result Date: 03/07/2018 CLINICAL DATA:  Leukocytosis EXAM: PORTABLE CHEST 1 VIEW COMPARISON:  03/05/2018 FINDINGS: Cardiac shadow is mildly enlarged but accentuated by the portable technique. Vascular congestion is again identified without significant edema. No focal confluent infiltrate is seen. Old rib fractures are noted bilaterally. No acute bony abnormality is seen. IMPRESSION: Stable cardiomegaly and vascular congestion. No new focal abnormality is seen. Electronically Signed   By: Inez Catalina M.D.   On: 03/07/2018 07:17   Dg Chest Port 1 View  Result Date: 03/05/2018 CLINICAL DATA:  Seizure.  History of diabetes and hypertension. EXAM: PORTABLE CHEST 1 VIEW COMPARISON:  Radiographs 10/04/2014. FINDINGS: 1847 hours. Stable cardiomegaly and aortic atherosclerosis. There is chronic vascular congestion without overt pulmonary edema, confluent airspace opacity, pneumothorax or significant pleural effusion. Old rib fractures are again noted bilaterally. Multiple telemetry leads overlie the chest. IMPRESSION: Cardiomegaly with vascular congestion. No overt edema or confluent airspace opacity. Electronically Signed   By: Richardean Sale M.D.   On: 03/05/2018 19:11    Scheduled Meds: . amantadine  100 mg Oral Daily  . enoxaparin (LOVENOX) injection  40 mg Subcutaneous Q24H  . ferrous sulfate  325 mg Oral QHS  . insulin aspart  0-5 Units Subcutaneous QHS  . insulin aspart  0-9 Units Subcutaneous TID WC  . levETIRAcetam  1,500 mg Oral BID  . levothyroxine  75 mcg Oral QAC breakfast  . phosphorus  500 mg Oral BID  . simvastatin  20 mg Oral q1800  . sodium chloride flush  3 mL Intravenous Q12H   Continuous Infusions: . sodium chloride 75 mL/hr (03/07/18 0417)  . lacosamide (VIMPAT) IV 200 mg (03/07/18 1053)     LOS: 1 day   Time spent: 25  minutes.  Patrecia Pour, MD [facility] [email or web address] Password [email or web address] 03/07/2018, 12:13 PM

## 2018-03-07 NOTE — Discharge Summary (Signed)
Physician Discharge Summary  Gerald Leach SWF:[id number] DOB: [birth year] DOA: 03/05/2018  PCP: [name], NP  Admit date: 03/05/2018 Discharge date: 03/07/2018  Admitted From: Home Disposition: Home   Recommendations for Outpatient Follow-up:  1. Follow up with PCP in 1-2 weeks 2. Please obtain BMP, LFTs, and CBC in the next week. Holding statin with elevated LFTs thought to be due to alcohol intake.  3. Follow up urinalysis and management of proteinuria as indicated if continues.  4. Follow up with neurology in 6 weeks.  Home Health: PT, OT. Pt refused SNF. Equipment/Devices: None new, has DME at home Discharge Condition: Stable CODE STATUS: Full Diet recommendation: Heart healthy, carb-modified  Brief/Interim Summary: Gerald Leach is an 84 y.o. male with a history of brain aneurysm s/p coiling, SDH s/p evacuation, seizure disorder, HTN, T2DM and hypothyroidism who presented 9/18 with a breakthrough seizure in the setting of missing AED doses and drinking alcohol. Neurology was consulted, recommending restart of home medications, abstinence from alcohol, and ongoing seizure precautions including driving restrictions. The patient lives alone and is unsteady on his feet, displaying impulsivity, but at mental baseline. Physical therapy has recommended SNF disposition though the patient has declined and opts to go home. Home health will be arranged.  Discharge Diagnoses:  Principal Problem:   Seizures ([place]) Active Problems:   Hyperlipidemia, group D   Seizure disorder ([place])   CNS aneurysm s/p coiling ([facility], 2002)   Diabetes mellitus type 2, controlled ([place])   Hypothyroidism   Gait instability   Leukocytosis  Breakthrough seizure, seizure disorder, medication nonadherence:  - Loaded with AED in ED, no seizures since. Restarted home AEDs. Neurology has signed off, recommending continuing this regimen and abstaining from alcohol. - Standard seizure precautions reviewed - No  further evaluation planned.   Impulsivity, gait imbalance, possible parkinson's disease: - Pt was started on amantadine in 2017 for left rigidity, masked facies and bradykinesia, hypophionia, gait instability. Will continue this. - Home health ordered.  Hypokalemia, hypophosphatemia:  - Replaced today po.  Elevated LFTs: In pattern consistent with pt's report of alcohol consumption. Tylenol level negative. Also possibly due to seizures. No abd/RUQ pain. - Monitor at follow up.   Leukocytosis: Reactive to seizures most likely. CXR negative, and again negative on repeat. No nidus for infection found and WBC normalized without antimicrobial Tx.  - No further evaluation planned.   History of cerebral aneurysm s/p coiling, SDH s/p evacuation complicated by seizure disorder:  - As above  T2DM: Historically has been well controlled with HbA1c 6.2%  - Continue metformin.  Hypothyroidism: TSH wnl at 2.341.  - Continue synthroid  Hyperlipidemia:  - Hold statin until follow up with elevated LFTs.  Proteinuria: Noted on UA in setting of multiple seizures. Possibly related to DM. - Recommend recheck and management as indicated if continues at follow up.   Discharge Instructions Discharge Instructions    Diet - low sodium heart healthy   Complete by:  As directed    Diet Carb Modified   Complete by:  As directed    Discharge instructions   Complete by:  As directed    - Remain on vimpat and keppra, do not miss any doses - Abstain from alcohol use - Per [facility] statutes, patients with seizures are not allowed to drive until  they have been seizure-free for six months. Use caution when using heavy equipment or power tools. Avoid working on ladders or at heights. Take showers instead of baths. Ensure  the water temperature is not too high on the home water heater. Do not go swimming alone. When caring for infants or small children, sit down when holding, feeding, or changing  them to minimize risk of injury to the child in the event you have a seizure.   Also, Maintain good sleep hygiene. Avoid alcohol.   Follow up with your primary doctor in the next 1-2 weeks. You will need repeat labs including liver function tests which were abnormal. Because of this, do not take simvastatin until you follow up. If your symptoms return, seek medical attention right away.   Increase activity slowly   Complete by:  As directed      Allergies as of 03/07/2018   No Known Allergies     Medication List    STOP taking these medications   simvastatin 10 MG tablet Commonly known as:  ZOCOR     TAKE these medications   amantadine 100 MG capsule Commonly known as:  SYMMETREL TAKE 1 CAPSULE DAILY   CENTRUM SILVER 50+MEN PO Take 1 tablet by mouth every morning.   ferrous sulfate 325 (65 FE) MG tablet Take 325 mg by mouth at bedtime.   glucose blood test strip Use once daily Dx E11.9   levETIRAcetam 750 MG tablet Commonly known as:  KEPPRA Take 2 tablets (1,500 mg total) by mouth 2 (two) times daily.   levothyroxine 75 MCG tablet Commonly known as:  SYNTHROID, LEVOTHROID TAKE 1 TABLET DAILY   metFORMIN 500 MG tablet Commonly known as:  GLUCOPHAGE Take 1 tablet (500 mg total) by mouth 2 (two) times daily with a meal.   nystatin powder Commonly known as:  MYCOSTATIN/NYSTOP Apply topically 2 (two) times daily.   omeprazole 20 MG capsule Commonly known as:  PRILOSEC TAKE 1 CAPSULE ON MONDAY,  WEDNESDAY, FRIDAY   onetouch ultrasoft lancets Use as instructed   VIMPAT 100 MG Tabs Generic drug:  Lacosamide TAKE 1 TABLET BY MOUTH TWICE A DAY   vitamin B-12 1000 MCG tablet Commonly known as:  CYANOCOBALAMIN Take 2,000 mcg by mouth at bedtime.      Follow-up Information    [name], NP. Schedule an appointment as soon as possible for a visit in 1 week(s).   Specialty:  Family Medicine Contact information: [address] [place]  [address] [phone]          No Known Allergies  Consultations:  Neurology  Procedures/Studies: Ct Head Wo Contrast  Result Date: 03/05/2018 CLINICAL DATA:  Seizure today. History of seizure, diabetes and brain aneurysm. EXAM: CT HEAD WITHOUT CONTRAST TECHNIQUE: Contiguous axial images were obtained from the base of the skull through the vertex without intravenous contrast. COMPARISON:  CT head 03/19/2017. FINDINGS: Brain: Imaging plane substandard due to physical condition. There is no evidence of acute intracranial hemorrhage, mass lesion, brain edema or extra-axial fluid collection. There is stable atrophy with generalized prominence of the ventricles and subarachnoid spaces. Large right supraclinoid partially calcified aneurysm appears stable, measuring 2.6 x 1.6 cm on image 24/3. There is diffuse chronic periventricular white matter disease. There is no CT evidence of acute cortical infarction. Vascular: As above, stable large right supraclinoid ICA aneurysm. No hyperdense vessel identified. Skull: Remote right-sided craniotomy.  No acute osseous findings. Sinuses/Orbits: Stable small polyp medially in the left maxillary sinus. There is no mucosal thickening or air-fluid levels. The mastoid air cells and middle ears are clear. No orbital abnormalities are identified. There is some soft tissue emphysema in the  left infratemporal fossa, most notable around the pterygoid musculature. Some air is also noted within the left internal jugular vein. No definite soft tissue inflammatory changes are identified. Other: None. IMPRESSION: 1. No evidence of acute intracranial process. 2. Stable atrophy, chronic small vessel ischemic changes and large right supraclinoid ICA aneurysm. 3. Prominent soft tissue emphysema in the left infratemporal fossa. This is more than typically seen incidentally from an intravenous line, although that is the likely etiology. No definite surrounding inflammatory changes.  Electronically Signed   By: Richardean Sale M.D.   On: 03/05/2018 15:58   Dg Chest Port 1 View  Result Date: 03/07/2018 CLINICAL DATA:  Leukocytosis EXAM: PORTABLE CHEST 1 VIEW COMPARISON:  03/05/2018 FINDINGS: Cardiac shadow is mildly enlarged but accentuated by the portable technique. Vascular congestion is again identified without significant edema. No focal confluent infiltrate is seen. Old rib fractures are noted bilaterally. No acute bony abnormality is seen. IMPRESSION: Stable cardiomegaly and vascular congestion. No new focal abnormality is seen. Electronically Signed   By: Inez Catalina M.D.   On: 03/07/2018 07:17   Dg Chest Port 1 View  Result Date: 03/05/2018 CLINICAL DATA:  Seizure.  History of diabetes and hypertension. EXAM: PORTABLE CHEST 1 VIEW COMPARISON:  Radiographs 10/04/2014. FINDINGS: 1847 hours. Stable cardiomegaly and aortic atherosclerosis. There is chronic vascular congestion without overt pulmonary edema, confluent airspace opacity, pneumothorax or significant pleural effusion. Old rib fractures are again noted bilaterally. Multiple telemetry leads overlie the chest. IMPRESSION: Cardiomegaly with vascular congestion. No overt edema or confluent airspace opacity. Electronically Signed   By: Richardean Sale M.D.   On: 03/05/2018 19:11   Subjective: Pt feels well. No further seizures reported. No abd pain, N/V/D, rashes.   Discharge Exam: Vitals:   03/07/18 0500 03/07/18 0924  BP: (!) 149/88 126/69  Pulse: 82 90  Resp: 20 18  Temp: 98.6 F (37 C) 98.2 F (36.8 C)  SpO2: 94% 95%   General: Pt is alert, awake, not in acute distress Cardiovascular: RRR, S1/S2 +, no rubs, no gallops Respiratory: CTA bilaterally, no wheezing, no rhonchi Abdominal: Soft, NT, ND, bowel sounds + Extremities: No edema, no cyanosis Skin: Small ecchymosis on midline upper lip, no bleeding, teeth intact. No pallor or jaundice or rashes. Neuro: Alert, oriented to person, place, situation,  month. At mental baseline. Has 5/5 strength, intact sensation throughout on seated exam. Needs min assist with rolling walker which is his baseline and drags left foot somewhat.  Labs: Basic Metabolic Panel: Recent Labs  Lab 03/05/18 1455 03/06/18 0305 03/07/18 0814  NA 139 140 135  K 4.0 3.5 3.4*  CL 103 107 106  CO2 25 25 20*  GLUCOSE 159* 108* 127*  BUN 7* 8 8  CREATININE 0.94 0.67 0.67  CALCIUM 8.9 8.4* 7.9*  MG  --   --  1.8  PHOS  --   --  1.9*   Liver Function Tests: Recent Labs  Lab 03/05/18 1455 03/07/18 0814  AST 407*  --   ALT 240*  --   ALKPHOS 204*  --   BILITOT 2.7*  --   PROT 7.2  --   ALBUMIN 3.5 2.8*   CBC: Recent Labs  Lab 03/05/18 1455 03/06/18 0305 03/07/18 0814  WBC 18.9* 12.8* 9.8  NEUTROABS  --   --  7.9*  HGB 15.2 14.3 13.5  HCT 47.6 43.3 40.6  MCV 87.8 86.1 85.3  PLT 227 202 163   CBG: Recent Labs  Lab 03/06/18 1140  03/06/18 1628 03/06/18 2017 03/07/18 0632 03/07/18 1119  GLUCAP 137* 95 122* 118* 149*   Thyroid function studies Recent Labs    03/05/18 2051  TSH 2.341    Time coordinating discharge: Approximately 40 minutes  Patrecia Pour, MD  [facility] 03/07/2018, 12:52 PM Pager [phone]

## 2018-03-07 NOTE — Care Management Note (Signed)
Case Management Note  Patient Details  Name: Gerald Leach MRN: [id number] Date of Birth: 02-27-1934  Subjective/Objective:                    Action/Plan:  Spoke w patient and significant other at bedside. Provided with choice for home health, [facility] requested, accepted by [facility]. No DME needs, no other CM needs identified.  Expected Discharge Date:  03/07/18               Expected Discharge Plan:  [place]  In-House Referral:     Discharge planning Services  CM Consult  Post Acute Care Choice:  Home Health Choice offered to:  (S.O.)  DME Arranged:    DME Agency:     HH Arranged:  OT HH Agency:  [place]  Status of Service:  Completed, signed off  If discussed at [place] of Stay Meetings, dates discussed:    Additional Comments:  Carles Collet, RN 03/07/2018, 2:25 PM

## 2018-03-10 ENCOUNTER — Telehealth: Payer: Self-pay | Admitting: *Deleted

## 2018-03-10 ENCOUNTER — Telehealth: Payer: Self-pay | Admitting: Adult Health

## 2018-03-10 NOTE — Telephone Encounter (Signed)
Unable to reach patient at time of TCM Call. Left message for patient to return call when available.  

## 2018-03-10 NOTE — Telephone Encounter (Signed)
Transition Care Management Follow-up Telephone Call  Per Discharge Summary: Admit date: 03/05/2018 Discharge date: 03/07/2018  Admitted From: Home Disposition: Home   Recommendations for Outpatient Follow-up:  1. Follow up with PCP in 1-2 weeks 2. Please obtain BMP, LFTs, and CBC in the next week. Holding statin with elevated LFTs thought to be due to alcohol intake.  3. Follow up urinalysis and management of proteinuria as indicated if continues.  4. Follow up with neurology in 6 weeks.  Home Health: PT, OT. Pt refused SNF. Equipment/Devices: None new, has DME at home Discharge Condition: Stable CODE STATUS: Full Diet recommendation: Heart healthy, carb-modified  -- Call completed w/ patient's son, Gerald Leach (on [place]).   How have you been since you were released from the hospital? "He seems to be doing well. He missed his seizure medicine just a couple times when he should have taken it."   Do you understand why you were in the hospital? yes   Do you understand the discharge instructions? yes   Where were you discharged to? Home   Items Reviewed:  Medications reviewed: yes  Allergies reviewed: yes  Dietary changes reviewed: yes  Referrals reviewed: yes   Functional Questionnaire:   Activities of Daily Living (ADLs):   He states they are independent in the following: ambulation, bathing and hygiene, feeding, continence, grooming, toileting and dressing States they require assistance with the following: none   Any transportation issues/concerns?: no, family members will drive him to appointments   Any patient concerns? yes   Confirmed importance and date/time of follow-up visits scheduled yes  Provider Appointment booked with [name], NP 03/11/18 @ 4:30pm  Confirmed with patient if condition begins to worsen call PCP or go to the ER.  Patient was given the office number and encouraged to call back with question or concerns.  : yes

## 2018-03-10 NOTE — Telephone Encounter (Signed)
Copied from [place] [id number] [phone]. Topic: General - Other >> Mar 10, 2018  1:47 PM Margot Ables wrote: Reason for CRM: Pt refused PT eval - pt states his wife is in nursing home and he will be staying with a friend.

## 2018-03-11 ENCOUNTER — Ambulatory Visit (INDEPENDENT_AMBULATORY_CARE_PROVIDER_SITE_OTHER): Payer: Medicare HMO

## 2018-03-11 ENCOUNTER — Encounter: Payer: Self-pay | Admitting: Adult Health

## 2018-03-11 ENCOUNTER — Ambulatory Visit (INDEPENDENT_AMBULATORY_CARE_PROVIDER_SITE_OTHER): Payer: Medicare HMO | Admitting: Adult Health

## 2018-03-11 VITALS — BP 120/64 | HR 97 | Temp 97.6°F | Wt 173.8 lb

## 2018-03-11 DIAGNOSIS — Z7189 Other specified counseling: Secondary | ICD-10-CM

## 2018-03-11 DIAGNOSIS — R945 Abnormal results of liver function studies: Secondary | ICD-10-CM | POA: Diagnosis not present

## 2018-03-11 DIAGNOSIS — M545 Low back pain, unspecified: Secondary | ICD-10-CM

## 2018-03-11 DIAGNOSIS — R809 Proteinuria, unspecified: Secondary | ICD-10-CM

## 2018-03-11 DIAGNOSIS — R2681 Unsteadiness on feet: Secondary | ICD-10-CM

## 2018-03-11 DIAGNOSIS — G40909 Epilepsy, unspecified, not intractable, without status epilepticus: Secondary | ICD-10-CM

## 2018-03-11 DIAGNOSIS — R7989 Other specified abnormal findings of blood chemistry: Secondary | ICD-10-CM

## 2018-03-11 LAB — POCT URINALYSIS DIPSTICK
Blood, UA: NEGATIVE
Glucose, UA: NEGATIVE
KETONES UA: NEGATIVE
Leukocytes, UA: NEGATIVE
Nitrite, UA: NEGATIVE
PH UA: 6 (ref 5.0–8.0)
Protein, UA: POSITIVE — AB
Urobilinogen, UA: 4 E.U./dL — AB

## 2018-03-11 NOTE — Addendum Note (Signed)
Addended by: Apolinar Junes on: 03/11/2018 06:54 PM   Modules accepted: Level of Service

## 2018-03-11 NOTE — Progress Notes (Deleted)
Lab Results  Component Value Date   HGBA1C 6.2 07/29/2017

## 2018-03-11 NOTE — Progress Notes (Addendum)
Subjective:    [name], male    DOB: 07-May-1934, 84 y.o.   MRN: [id number]  HPI  84 year old male who  has a past medical history of Brain aneurysm, Colonic polyp (2003), Dementia, Diabetes mellitus without complication ([place]), ED (erectile dysfunction), GERD (gastroesophageal reflux disease), Hyperlipidemia, Hypertension, Hypothyroidism, Seizures ([place]), and Seizures ([place]). His son is with him at this visit to help provide history  He presents to the office today for TCM visit   Admit Date: 03/05/2018 Discharge Date: 03/07/18  He presented to the ER on 918 with a breakthrough seizure in the setting of missing AED doses and drinking alcohol.   Hospital Course:   1. Breakthrough Seizure In the emergency room he was loaded with AED and had no seizures since that time.  He is restarted on home medications.  Neurology was consulted and signed off, recommending continuation of home regimen and abstaining from alcohol.  2.  Gait Instability -continued Amantadine, which he has been on since 2017. Home health was ordered   3. Hypokalemia/Hypophosphatemia - replaced via PO   4. Elevated LFT's -consistent with patient's report alcohol consumption.  Tylenol level is negative.  Thought it could also be due to seizures.  And was held until follow-up and LFTs could be redrawn  5. Leukocytosis -active to seizure most likely.  Chest x-ray was negative and negative on repeat.  White blood cell normalized without antimicrobial treatment.  6. Proteinuria -noted on UA in setting of multiple seizures.  Possibly related to diabetes.  Was recommended recheck and management as indicated  Today in the office he reports that he is not know why he was in the emergency room.  He does not remember having seizures and does not know how much alcohol he drank.  Family reports that he may have had 3 drinks with a friend the evening of the seizure but there not 100% sure.  She does complain of low back  pain that has been present from the time he was in the hospital.  The patient believes that he may have fallen out of bed.  Health was ordered by the hospital team he refused home health orders yesterday stating that his wife was in a nursing home and he will be staying with a friend.  His son reports that he was staying with a friend but she lasted approximately 3 days and then could not take care of him any longer.  He has tried to get him into the same assisted living facility as his wife is then but he continues to refuse.  He is currently living at home by himself.  He should not reports that he has not missed any medication since being discharged.  We do not know how reliable this is.  Review of Systems  Constitutional: Negative.   HENT: Negative.   Eyes: Negative.   Respiratory: Negative.   Cardiovascular: Negative.   Endocrine: Negative.   Musculoskeletal: Positive for arthralgias, back pain and gait problem.  Skin: Negative.   Hematological: Negative.   Psychiatric/Behavioral: Positive for confusion.  All other systems reviewed and are negative.  Past Medical History:  Diagnosis Date  . Brain aneurysm   . Colonic polyp 2003  . Dementia   . Diabetes mellitus without complication ([place])   . ED (erectile dysfunction)   . GERD (gastroesophageal reflux disease)   . Hyperlipidemia   . Hypertension   . Hypothyroidism   . Seizures ([place])    per  family  . Seizures ([place])     Social History   Socioeconomic History  . Marital status: Married    Spouse name: Not on file  . Number of children: 2  . Years of education: Not on file  . Highest education level: Not on file  Occupational History  . Not on file  Social Needs  . Financial resource strain: Not on file  . Food insecurity:    Worry: Not on file    Inability: Not on file  . Transportation needs:    Medical: Not on file    Non-medical: Not on file  Tobacco Use  . Smoking status: Never Smoker  . Smokeless tobacco:  Never Used  Substance and Sexual Activity  . Alcohol use: Yes    Alcohol/week: 0.0 standard drinks    Comment: rum mixed in diet coke 3 a week  . Drug use: Yes    Types: Nitrous oxide  . Sexual activity: Not on file  Lifestyle  . Physical activity:    Days per week: Not on file    Minutes per session: Not on file  . Stress: Not on file  Relationships  . Social connections:    Talks on phone: Not on file    Gets together: Not on file    Attends religious service: Not on file    Active member of club or organization: Not on file    Attends meetings of clubs or organizations: Not on file    Relationship status: Not on file  . Intimate partner violence:    Fear of current or ex partner: Not on file    Emotionally abused: Not on file    Physically abused: Not on file    Forced sexual activity: Not on file  Other Topics Concern  . Not on file  Social History Narrative   ** Merged History Encounter **       Lives with wife in a 2 story home.  Has no trouble with the stairs as long as he holds on to the banister.  Education: college.  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      Past Surgical History:  Procedure Laterality Date  . Aneurysmal clipping    . brain aneurysm surgery     at [facility]  . carotid artery aneurysm    . COLONOSCOPY     polyps  . CRANIOTOMY Right 09/19/2012   Procedure: CRANIOTOMY HEMATOMA EVACUATION SUBDURAL;  Surgeon: Otilio Connors, MD;  Location: [facility]URO ORS;  Service: Neurosurgery;  Laterality: Right;  . CRANIOTOMY Right 09/22/2012   Procedure: CRANIOTOMY HEMATOMA EVACUATION SUBDURAL;  Surgeon: Otilio Connors, MD;  Location: [facility]URO ORS;  Service: Neurosurgery;  Laterality: Right;  . Craniotomy.    . right hernia      Family History  Problem Relation Age of Onset  . Liver disease Brother        Died  . Seizures Neg Hx     No Known Allergies  Current Outpatient Medications on File Prior to Visit  Medication Sig  Dispense Refill  . amantadine (SYMMETREL) 100 MG capsule TAKE 1 CAPSULE DAILY 90 capsule 3  . ferrous sulfate 325 (65 FE) MG tablet Take 325 mg by mouth at bedtime.    Marland Kitchen glucose blood (ONETOUCH VERIO) test strip Use once daily Dx E11.9 100 each 12  . Lancets (ONETOUCH ULTRASOFT) lancets Use as instructed 100 each 12  . levETIRAcetam (KEPPRA) 750 MG tablet  Take 2 tablets (1,500 mg total) by mouth 2 (two) times daily. 360 tablet 3  . levothyroxine (SYNTHROID, LEVOTHROID) 75 MCG tablet TAKE 1 TABLET DAILY 90 tablet 3  . metFORMIN (GLUCOPHAGE) 500 MG tablet Take 1 tablet (500 mg total) by mouth 2 (two) times daily with a meal. 180 tablet 0  . Multiple Vitamins-Minerals (CENTRUM SILVER 50+MEN PO) Take 1 tablet by mouth every morning.    . nystatin (MYCOSTATIN/NYSTOP) powder Apply topically 2 (two) times daily. 45 g 3  . omeprazole (PRILOSEC) 20 MG capsule TAKE 1 CAPSULE ON MONDAY,  WEDNESDAY, FRIDAY 39 capsule 1  . VIMPAT 100 MG TABS TAKE 1 TABLET BY MOUTH TWICE A DAY 180 tablet 1  . vitamin B-12 (CYANOCOBALAMIN) 1000 MCG tablet Take 2,000 mcg by mouth at bedtime.     No current facility-administered medications on file prior to visit.     BP 120/64 (BP Location: Left Arm, Patient Position: Sitting, Cuff Size: Normal)   Pulse 97   Temp 97.6 F (36.4 C) (Oral)   Wt 173 lb 12.8 oz (78.8 kg)   SpO2 97%   BMI 22.93 kg/m       Objective:   Physical Exam  Constitutional: He appears well-developed and well-nourished. No distress.  HENT:  Hard of hearing   Eyes: Pupils are equal, round, and reactive to light. Conjunctivae and EOM are normal.  Cardiovascular: Normal rate, regular rhythm, normal heart sounds and intact distal pulses.  Pulmonary/Chest: Effort normal and breath sounds normal.  Musculoskeletal: He exhibits tenderness (Tenderness with palpation to lower lumbar spine.).  Asked with a slow slightly limping gait.  Uses of rolling walker with seat for ambulation  Neurological: He is  alert.  Skin: Skin is warm and dry. He is not diaphoretic.  Psychiatric: He has a normal mood and affect. His behavior is normal. His speech is delayed. Cognition and memory are impaired. He exhibits abnormal recent memory and abnormal remote memory.  Nursing note and vitals reviewed.     Assessment & Plan:  1. Seizure disorder [facility]) -Reviewed labs, discharge note, and any additional imaging done in the hospital with the patient's son -Courage patient to take his medications on a daily basis.  Family can set out weekly pill packs for the patient.  He was also advised to abstain from alcohol consumption. - Basic Metabolic Panel - CBC with Differential/Platelet - Hepatic function panel - POC Urinalysis Dipstick - Ambulatory referral to [place]  2. Proteinuria, unspecified type  - POC Urinalysis Dipstick - Culture, Urine  3. Acute bilateral low back pain without sciatica  - DG Lumbar Spine Complete; Future  4. Elevated LFTs - Likely place back on statin  - Hepatic function panel  5. Gait instability  - Ambulatory referral to Home Health  6. Encounter for home safety review for injury prevention -Spoke to patient about living at home by himself and how this is unsafe living situation for him he continues to refuse to be placed in assisted living facility with his wife.  I do not think living at home is a viable option for [place] at this time and family agrees with me.  Will send social worker to home to evaluate him.  His son is also going to follow-up with neurology to see if they can help seen unsafe living situation - Ambulatory referral to [place], NP

## 2018-03-12 ENCOUNTER — Other Ambulatory Visit: Payer: Self-pay | Admitting: Adult Health

## 2018-03-12 DIAGNOSIS — R748 Abnormal levels of other serum enzymes: Secondary | ICD-10-CM

## 2018-03-12 LAB — CBC WITH DIFFERENTIAL/PLATELET
BASOS PCT: 1.4 % (ref 0.0–3.0)
Basophils Absolute: 0.1 10*3/uL (ref 0.0–0.1)
Eosinophils Absolute: 0.1 10*3/uL (ref 0.0–0.7)
Eosinophils Relative: 1.7 % (ref 0.0–5.0)
HEMATOCRIT: 39.8 % (ref 39.0–52.0)
Hemoglobin: 13 g/dL (ref 13.0–17.0)
LYMPHS PCT: 26.9 % (ref 12.0–46.0)
Lymphs Abs: 2 10*3/uL (ref 0.7–4.0)
MCHC: 32.8 g/dL (ref 30.0–36.0)
MCV: 86.7 fl (ref 78.0–100.0)
MONOS PCT: 9.7 % (ref 3.0–12.0)
Monocytes Absolute: 0.7 10*3/uL (ref 0.1–1.0)
NEUTROS ABS: 4.4 10*3/uL (ref 1.4–7.7)
Neutrophils Relative %: 60.3 % (ref 43.0–77.0)
PLATELETS: 266 10*3/uL (ref 150.0–400.0)
RBC: 4.59 Mil/uL (ref 4.22–5.81)
RDW: 14.8 % (ref 11.5–15.5)
WBC: 7.4 10*3/uL (ref 4.0–10.5)

## 2018-03-12 LAB — BASIC METABOLIC PANEL
BUN: 12 mg/dL (ref 6–23)
CHLORIDE: 102 meq/L (ref 96–112)
CO2: 26 meq/L (ref 19–32)
CREATININE: 0.61 mg/dL (ref 0.40–1.50)
Calcium: 9 mg/dL (ref 8.4–10.5)
GFR: 134.37 mL/min (ref >60.00)
Glucose, Bld: 128 mg/dL — ABNORMAL HIGH (ref 70–99)
Potassium: 4.5 mEq/L (ref 3.5–5.1)
SODIUM: 138 meq/L (ref 135–145)

## 2018-03-12 LAB — HEPATIC FUNCTION PANEL
ALBUMIN: 3.7 g/dL (ref 3.5–5.2)
ALT: 100 U/L — ABNORMAL HIGH (ref 0–53)
AST: 92 U/L — ABNORMAL HIGH (ref 0–37)
Alkaline Phosphatase: 391 U/L — ABNORMAL HIGH (ref 39–117)
Bilirubin, Direct: 0.8 mg/dL — ABNORMAL HIGH (ref 0.0–0.3)
TOTAL PROTEIN: 6.8 g/dL (ref 6.0–8.3)
Total Bilirubin: 1.9 mg/dL — ABNORMAL HIGH (ref 0.2–1.2)

## 2018-03-12 LAB — URINE CULTURE
MICRO NUMBER:: 91146375
Result:: NO GROWTH
SPECIMEN QUALITY: ADEQUATE

## 2018-03-21 DIAGNOSIS — B351 Tinea unguium: Secondary | ICD-10-CM | POA: Diagnosis not present

## 2018-03-21 DIAGNOSIS — M79672 Pain in left foot: Secondary | ICD-10-CM | POA: Diagnosis not present

## 2018-03-21 DIAGNOSIS — M79671 Pain in right foot: Secondary | ICD-10-CM | POA: Diagnosis not present

## 2018-03-21 DIAGNOSIS — E1151 Type 2 diabetes mellitus with diabetic peripheral angiopathy without gangrene: Secondary | ICD-10-CM | POA: Diagnosis not present

## 2018-03-26 ENCOUNTER — Other Ambulatory Visit: Payer: Self-pay

## 2018-03-27 ENCOUNTER — Telehealth: Payer: Self-pay | Admitting: Adult Health

## 2018-03-27 NOTE — Telephone Encounter (Signed)
Copied from [place] [id number]. Topic: Quick Communication - See Telephone Encounter >> Mar 27, 2018  9:06 AM Gardiner Ramus wrote: CRM for notification. See Telephone encounter for: 03/27/18. Pt daughter called and stated that she would like someone to call her bother [place] Ar. Daughter states that father needs home health. Could not disclose any information because she was not on DPR. Please advise

## 2018-03-27 NOTE — Telephone Encounter (Signed)
Called and spoke with brother Gerald Leach, states that someone was supposed to call and set Home Health up. Gerald Leach advised to call [facility] and check the status of setting up the eval. Aware that the referral was placed on 03/11/18 directly to [facility] and they handle their scheduling. Gerald Leach is going to call and will reach back out to our office if anything further needed.

## 2018-04-21 ENCOUNTER — Other Ambulatory Visit (INDEPENDENT_AMBULATORY_CARE_PROVIDER_SITE_OTHER): Payer: Medicare HMO

## 2018-04-21 DIAGNOSIS — R748 Abnormal levels of other serum enzymes: Secondary | ICD-10-CM

## 2018-04-21 LAB — HEPATIC FUNCTION PANEL
ALK PHOS: 108 U/L (ref 39–117)
ALT: 11 U/L (ref 0–53)
AST: 11 U/L (ref 0–37)
Albumin: 4 g/dL (ref 3.5–5.2)
BILIRUBIN DIRECT: 0.1 mg/dL (ref 0.0–0.3)
BILIRUBIN TOTAL: 0.5 mg/dL (ref 0.2–1.2)
Total Protein: 6.8 g/dL (ref 6.0–8.3)

## 2018-04-27 ENCOUNTER — Telehealth: Payer: Self-pay | Admitting: Adult Health

## 2018-04-29 NOTE — Telephone Encounter (Signed)
Last A1C 07/29/17.  Please advise.

## 2018-04-29 NOTE — Telephone Encounter (Signed)
Ok to refill for 90 days  

## 2018-04-29 NOTE — Telephone Encounter (Signed)
Sent to the pharmacy by e-scribe as directed.  No further action required.

## 2018-05-07 NOTE — Telephone Encounter (Signed)
Pt called and state that medication needs to be sent to  [place], [place] to [facility] [phone] (Phone) [phone]

## 2018-05-09 DIAGNOSIS — R69 Illness, unspecified: Secondary | ICD-10-CM | POA: Diagnosis not present

## 2018-06-20 ENCOUNTER — Encounter: Payer: Self-pay | Admitting: Neurology

## 2018-06-20 ENCOUNTER — Encounter

## 2018-06-20 ENCOUNTER — Ambulatory Visit: Payer: Medicare HMO | Admitting: Neurology

## 2018-06-20 VITALS — BP 128/80 | HR 86 | Ht 70.5 in | Wt 182.0 lb

## 2018-06-20 DIAGNOSIS — R269 Unspecified abnormalities of gait and mobility: Secondary | ICD-10-CM | POA: Diagnosis not present

## 2018-06-20 DIAGNOSIS — R2689 Other abnormalities of gait and mobility: Secondary | ICD-10-CM

## 2018-06-20 DIAGNOSIS — R69 Illness, unspecified: Secondary | ICD-10-CM | POA: Diagnosis not present

## 2018-06-20 DIAGNOSIS — R569 Unspecified convulsions: Secondary | ICD-10-CM | POA: Diagnosis not present

## 2018-06-20 DIAGNOSIS — F0390 Unspecified dementia without behavioral disturbance: Secondary | ICD-10-CM

## 2018-06-20 MED ORDER — CARBIDOPA-LEVODOPA 25-100 MG PO TABS: ORAL_TABLET | ORAL | 2 refills | Status: DC

## 2018-06-20 MED ORDER — LACOSAMIDE 100 MG PO TABS: 1.0000 | ORAL_TABLET | Freq: Two times a day (BID) | ORAL | 3 refills | Status: DC

## 2018-06-20 MED ORDER — LEVETIRACETAM 750 MG PO TABS: 1500.0000 mg | ORAL_TABLET | Freq: Two times a day (BID) | ORAL | 3 refills | Status: DC

## 2018-06-20 NOTE — Progress Notes (Signed)
Follow-up Visit   Date: 06/20/18   Gerald Leach MRN: [id number] DOB: 1934-01-12   Interim History: Gerald Leach is a 85 y.o. caucasian male with hypertension, hyperlipidemia, GERD, DM, CNS aneurysm s/p coiling ([facility], 2002), and SDH s/p craniotomy (08/7167) complicated by seizure disorder returning to the clinic for follow-up of seizures disorder and dementia.  He is accompanied by his male companion.  History of present illness: In April 2014, he sustained a fall and became acutely encephalopathic. He was admitted from 4/3 - 09/30/2012 for left SDH and underwent evacuation x 2 and placed on seizure prophylaxis with keppra. Due to breakthrough seizures, keppra was increased to 1000mg  BID.  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  Again in 2015, he had another seizure and Keppra was increased to 1250mg  BID. From 2015 - 2017, he continued to have breakthrough seizures and optimized on Keppra 1500mg  BID and vimpat 100mg  BID.  With this regimen, he only has breakthrough seizure with medication noncompliance.    He has had gait problems since 2013, one where he fractured T7 vertebra. He had myelogram which showed high-grade stenosis at T7 and was evaluated by Dr. Ronnald Ramp, neurosurgeon. They advised that a watchful waiting or surgery and he elected the conservative option.   In 2014, he was found to have vitamin B12 deficiency and diagnosed with peripheral neuropathy (confimed by EMG).    In early 2017, patient had lumbar puncture with high volume tap to evaluate for NPH, which did not demonstrate significant improvement in his gait.  He also had MRI brain and lumbar spine for his left leg weakness, which did not show structural lesion to explain symptoms.    UPDATE 06/20/2018:  He is here for 1 year follow-up visit.  He has had one  hospitalization for breakthrough seizure in the setting of medication noncompliance and alcohol use.  His wife was living with him at home until 2019 when she was moved into a memory unit. He continues to live at home and has assistance from his male companion for meals and medication management.  He has some oversight of his finances.  In September, his power was cut off due to billing not being paid.  He continues to have problems with walking and balance. Fortunately, he has not suffered any falls.   Medications:  Current Outpatient Medications on File Prior to Visit  Medication Sig Dispense Refill  . amantadine (SYMMETREL) 100 MG capsule TAKE 1 CAPSULE DAILY 90 capsule 3  . ferrous sulfate 325 (65 FE) MG tablet Take 325 mg by mouth at bedtime.    Gerald Leach Kitchen glucose blood (ONETOUCH VERIO) test strip Use once daily Dx E11.9 100 each 12  . Lancets (ONETOUCH ULTRASOFT) lancets Use as instructed 100 each 12  . levothyroxine (SYNTHROID, LEVOTHROID) 75 MCG tablet TAKE 1 TABLET DAILY 90 tablet 3  . metFORMIN (GLUCOPHAGE) 500 MG tablet TAKE 1 TABLET (500 MG TOTAL) BY MOUTH 2 (TWO) TIMES DAILY WITH A MEAL 180 tablet 0  . Multiple Vitamins-Minerals (CENTRUM SILVER 50+MEN PO) Take 1 tablet by mouth every morning.    . nystatin (MYCOSTATIN/NYSTOP) powder Apply topically 2 (two) times daily. 45 g 3  . omeprazole (PRILOSEC) 20 MG capsule TAKE 1 CAPSULE ON MONDAY,  WEDNESDAY, FRIDAY 39 capsule 1  . vitamin B-12 (CYANOCOBALAMIN) 1000 MCG tablet Take 2,000 mcg by mouth at bedtime.  No current facility-administered medications on file prior to visit.     Allergies: No Known Allergies   Review of Systems:  CONSTITUTIONAL: No fevers, chills, night sweats, or weight loss.   EYES: No visual changes or eye pain ENT: No hearing changes.  No history of nose bleeds.   RESPIRATORY: No cough, wheezing and shortness of breath.   CARDIOVASCULAR: Negative for chest pain, and palpitations.   GI: Negative for abdominal  discomfort, blood in stools or black stools.  No recent change in bowel habits.   GU:  No history of incontinence.   MUSCLOSKELETAL: No history of joint pain or swelling.  No myalgias.   SKIN: No lesions, rash, and itching.   ENDOCRINE: Negative for cold or heat intolerance, polydipsia or goiter.   PSYCH:  +depression or anxiety symptoms.   NEURO: As Above.   Vital Signs:  BP 128/80   Pulse 86   Ht 5' 10.5" (1.791 m)   Wt 182 lb (82.6 kg)   SpO2 98%   BMI 25.75 kg/m   Neurological Exam: MENTAL STATUS:  He appears more alert and cheerful today.  He is quicker to process one-step commands and answer questions.  He is oriented to person, place, year, and month.  CRANIAL NERVES: Pupils equal round and reactive to light.  Restricted upgaze bilaterally, otherwise extra-ocular eye movements intact.  Mild left facial droop.   MOTOR:  Bilateral UE and UE is 5/5 throughout except 5-/5 instrinsic hand muscles and bilateral dorsiflexion 4/5.  Tone is normal.  COORDINATION/GAIT:  Mild bradykinesia with finger tapping, worse on the left.  Shuffling gait, unsteady.   Data: MRI brain wo contrast 10/03/2014: 1. Stable.  No acute intracranial abnormality. 2. Sequelae of right ICA occlusion with stable thrombosed appearing giant right ICA terminus aneurysm up to 3 cm. 3. 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 Sequelae of right frontal craniotomy. 4. Cerebral volume loss with suspected ex vacuo ventricle enlargement and nonspecific chronic white matter signal changes.  EMG 04/13/2013: 1. Generalized large fiber sensorimotor polyneuropathy affecting the left side; moderate in degree electrically. A mild superimposed L5-S1 radiculopathy cannot be excluded. 2. Left median neuropathy at or distal to the wrist consistent with the clinical diagnosis of carpal tunnel syndrome, overall these changes are mild-moderate in degree electrically. 3. Mild left ulnar neuropathy at the elbow with  purely demyelinating features evidenced as conduction velocity slowing.  MRI lumbar spine wo contrast 04/08/2015: Mild central canal narrowing L3-4 without nerve root compression. 0.4 cm anterolisthesis L4 on L5 due to facet arthropathy. The central canal and foramina remain open this level. Mild scoliosis.  Lab Results  Component Value Date   TSH 2.341 03/05/2018   Lab Results  Component Value Date   HGBA1C 6.2 07/29/2017   Lab Results  Component Value Date   WUXLKGMW10 272 11/05/2014   PROBLEM LIST: 1.  Seizure disorder 2.  Multifactorial dementia 3.  Gait disorder 4.  History of SDH in 09/2012 s/p evacuation 5.  Right clinoid calcified meningioma vs thrombosed giant ICA aneurysm 6. High grade T7 central canal stenosis with T7 vertebral fracture s/p fall (02/2012).  Previously seen Dr. Ronnald Ramp in Neurosurgery, patient elected conservative management, with residual left foot weakness 7.  Ventriculomegaly.  High volume spinal tap did not show marked change in gait pattern so unlikely to represent NPH   IMPRESSION/PLAN: 1. Generalized tonic clonic seizure disorder, history of subdural hematoma 09/2012.  Overall, he has been doing well with only 1 breakthrough seizure  in the past year in the setting of medication noncompliance and alcohol use.    - Continue Keppra 1500mg  BID and vimpat 100mg  BID  - Medication compliance stressed  - Abstinence of alcohol encouraged  2.  Shuffling gait and blunted affect raises suspicion for parkinsonism.  He does not have tremor or rigidity.  I will offer a trial of sinemet - start half-tab TID and titrate to 1 tablet TID.  Start physical therapy for gait training.  Fall precautions discussed, always use a cane/walker.  3.  Multifactorial dementia, severe and interfering with IADLs and ADLs.  He now has caregiver to help with meals and medications.  Overall, mood appears better.  Continue amantidine 100mg  daily.  Encouraged to do cognitively stimulating  activities and start exercise program, such as using the stationary bike or water exercises  4.  Multifactorial gait abnormality with worsening shuffling pattern.  He has previously been evaluated for NPH with negative work-up.  He has large fiber neuropathy and known thoracic canal stenosis.    Return to clinic in 3 months  Greater than 50% of this 30 minute visit was spent in counseling, explanation of diagnosis, planning of further management, and coordination of care.   Thank you for allowing me to participate in patient's care.  If I can answer any additional questions, I would be pleased to do so.    Sincerely,    Torri Langston K. Posey Pronto, DO

## 2018-06-20 NOTE — Patient Instructions (Addendum)
Start Carbidopa Levodopa as follows at least 30-min prior to meals:     AM  Afternoon   Evening   Week 1:  1/2 tab  1/2 tab   1/2 tab  Week 2:   1/2 tab  1/2 tab   1 tab  Week 3:  1/2 tab  1 tab   1 tab  Week 4:  1 tab  1 tab   1 tab  *Avoid taking with protein products, such as milk, meat, cheese  *if you develop nausea, take with crackers  Start Home physical therapy  Continue Keppra 1500mg  twice daily and vimpat 100mg  twice daily  Return to clinic in 3 months

## 2018-07-28 ENCOUNTER — Other Ambulatory Visit: Payer: Self-pay | Admitting: Adult Health

## 2018-07-30 ENCOUNTER — Encounter: Payer: Self-pay | Admitting: Family Medicine

## 2018-07-30 NOTE — Telephone Encounter (Signed)
Sent to the pharmacy by e-scribe for 90 days.  Pt due for cpx and lab work.  Letter mailed.

## 2018-08-04 ENCOUNTER — Other Ambulatory Visit: Payer: Self-pay | Admitting: Adult Health

## 2018-08-04 ENCOUNTER — Other Ambulatory Visit: Payer: Self-pay | Admitting: *Deleted

## 2018-08-04 MED ORDER — AMANTADINE HCL 100 MG PO CAPS: 100.0000 mg | ORAL_CAPSULE | Freq: Every day | ORAL | 3 refills | Status: DC

## 2018-08-05 NOTE — Telephone Encounter (Signed)
Left a message for a return call.

## 2018-08-07 NOTE — Telephone Encounter (Signed)
Denied.  Pt needs an appt 

## 2018-08-20 ENCOUNTER — Emergency Department (HOSPITAL_COMMUNITY): Payer: Medicare HMO

## 2018-08-20 ENCOUNTER — Other Ambulatory Visit: Payer: Self-pay

## 2018-08-20 ENCOUNTER — Encounter (HOSPITAL_COMMUNITY): Payer: Self-pay | Admitting: *Deleted

## 2018-08-20 ENCOUNTER — Emergency Department (HOSPITAL_COMMUNITY)
Admission: EM | Admit: 2018-08-20 | Discharge: 2018-08-20 | Disposition: A | Discharge status: Home / Self Care | Payer: Medicare HMO | Attending: Emergency Medicine | Admitting: Emergency Medicine

## 2018-08-20 DIAGNOSIS — M25551 Pain in right hip: Secondary | ICD-10-CM

## 2018-08-20 DIAGNOSIS — R69 Illness, unspecified: Secondary | ICD-10-CM | POA: Diagnosis not present

## 2018-08-20 DIAGNOSIS — S79911A Unspecified injury of right hip, initial encounter: Secondary | ICD-10-CM | POA: Diagnosis not present

## 2018-08-20 DIAGNOSIS — Z79899 Other long term (current) drug therapy: Secondary | ICD-10-CM | POA: Insufficient documentation

## 2018-08-20 DIAGNOSIS — Z7984 Long term (current) use of oral hypoglycemic drugs: Secondary | ICD-10-CM | POA: Diagnosis not present

## 2018-08-20 DIAGNOSIS — E119 Type 2 diabetes mellitus without complications: Secondary | ICD-10-CM | POA: Diagnosis not present

## 2018-08-20 DIAGNOSIS — W19XXXA Unspecified fall, initial encounter: Secondary | ICD-10-CM | POA: Diagnosis not present

## 2018-08-20 DIAGNOSIS — Y998 Other external cause status: Secondary | ICD-10-CM | POA: Insufficient documentation

## 2018-08-20 DIAGNOSIS — Y929 Unspecified place or not applicable: Secondary | ICD-10-CM | POA: Insufficient documentation

## 2018-08-20 DIAGNOSIS — E039 Hypothyroidism, unspecified: Secondary | ICD-10-CM | POA: Diagnosis not present

## 2018-08-20 DIAGNOSIS — Y939 Activity, unspecified: Secondary | ICD-10-CM | POA: Diagnosis not present

## 2018-08-20 DIAGNOSIS — F1810 Inhalant abuse, uncomplicated: Secondary | ICD-10-CM | POA: Insufficient documentation

## 2018-08-20 DIAGNOSIS — I10 Essential (primary) hypertension: Secondary | ICD-10-CM | POA: Diagnosis not present

## 2018-08-20 DIAGNOSIS — F0390 Unspecified dementia without behavioral disturbance: Secondary | ICD-10-CM | POA: Insufficient documentation

## 2018-08-20 NOTE — ED Triage Notes (Signed)
Pt reports he was walking when he lost his balance and fell back, landing on his buttocks.  Pt reports R hip pain.  No bruising noted.  He was ambulatory.  Pt's daughter reports pt was ambulating without his cane or walker.  He lives alone.  He would not go stay with his wife at the memory care, and would not pay for home health care.  Pt is A&Ox 4, and is HOH.

## 2018-08-20 NOTE — ED Provider Notes (Signed)
[place] DEPT Provider Note   CSN: [id number] Arrival date & time: 08/20/18  1807    History   Chief Complaint Chief Complaint  Patient presents with  . Fall    HPI Gerald Leach is a 85 y.o. male.     The history is provided by the patient.  Fall  This is a new problem. The current episode started 2 days ago. The problem occurs daily. The problem has been rapidly improving. Pertinent negatives include no chest pain, no abdominal pain, no headaches and no shortness of breath. Associated symptoms comments: Right hip pain. The symptoms are aggravated by walking. The symptoms are relieved by rest. He has tried nothing for the symptoms. The treatment provided no relief.    Past Medical History:  Diagnosis Date  . Brain aneurysm   . Colonic polyp 2003  . Dementia ([place])   . Diabetes mellitus without complication ([place])   . ED (erectile dysfunction)   . GERD (gastroesophageal reflux disease)   . Hyperlipidemia   . Hypertension   . Hypothyroidism   . Seizures ([place])    per family  . Seizures [facility])     Patient Active Problem List   Diagnosis Date Noted  . Seizures ([place]) 03/05/2018  . Leukocytosis 03/05/2018  . Diabetic foot ulcers ([place]) 04/23/2017  . Parkinson disease ([place])   . Gait instability   . Diabetes mellitus type 2, controlled ([place]) 08/05/2015  . Hypothyroidism 08/05/2015  . Fall at home 10/02/2014  . CNS aneurysm s/p coiling ([facility], 2002) 10/02/2014  . High grade T7 central canal stenosis with T7 vertebral fracture s/p fall (02/2012) 10/02/2014  . 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 10/02/2014  . Syncope 02/05/2014  . Drug-induced delirium(292.81) 05/16/2013  . Seizure disorder ([place]) 04/21/2013  . Seizure disorder, grand mal ([place]) 03/24/2013  . Cervical compression fracture---C7 10/29/2012  . Dysphagia 09/21/2012  . Subdural hematoma ([place]) 09/20/2012  . SOLAR KERATOSIS  02/17/2009  . CARCINOMA, SKIN, SQUAMOUS CELL, FACE 04/19/2008  . Hyperlipidemia, group D 01/27/2008  . COLONIC POLYPS 11/15/2006  . GERD 11/15/2006    Past Surgical History:  Procedure Laterality Date  . Aneurysmal clipping    . brain aneurysm surgery     at [facility]  . carotid artery aneurysm    . COLONOSCOPY     polyps  . CRANIOTOMY Right 09/19/2012   Procedure: CRANIOTOMY HEMATOMA EVACUATION SUBDURAL;  Surgeon: Otilio Connors, MD;  Location: [facility] NEURO ORS;  Service: Neurosurgery;  Laterality: Right;  . CRANIOTOMY Right 09/22/2012   Procedure: CRANIOTOMY HEMATOMA EVACUATION SUBDURAL;  Surgeon: Otilio Connors, MD;  Location: [facility] NEURO ORS;  Service: Neurosurgery;  Laterality: Right;  . Craniotomy.    . right hernia          Home Medications    Prior to Admission medications   Medication Sig Start Date End Date Taking? Authorizing Provider  amantadine (SYMMETREL) 100 MG capsule Take 1 capsule (100 mg total) by mouth daily. 08/04/18   Patel, Arvin Collard K, DO  carbidopa-levodopa (SINEMET IR) 25-100 MG tablet Take 1 tablet 30-minute prior to meals three times daily. 06/20/18   Narda Amber K, DO  ferrous sulfate 325 (65 FE) MG tablet Take 325 mg by mouth at bedtime.    [provider]  glucose blood (ONETOUCH VERIO) test strip Use once daily Dx E11.9 11/19/16   [name], NP  Lacosamide (VIMPAT) 100 MG TABS Take 1 tablet (100 mg  total) by mouth 2 (two) times daily. 06/20/18   Narda Amber K, DO  Lancets [facility] ULTRASOFT) lancets Use as instructed 09/16/15   [name], NP  levETIRAcetam (KEPPRA) 750 MG tablet Take 2 tablets (1,500 mg total) by mouth 2 (two) times daily. 06/20/18   Alda Berthold, DO  levothyroxine (SYNTHROID, LEVOTHROID) 75 MCG tablet TAKE 1 TABLET DAILY 10/29/17   Nafziger, Tommi Rumps, NP  metFORMIN (GLUCOPHAGE) 500 MG tablet TAKE 1 TABLET (500 MG TOTAL) BY MOUTH 2 (TWO) TIMES DAILY WITH A MEAL 04/29/18   Nafziger, Tommi Rumps, NP  Multiple Vitamins-Minerals (CENTRUM  SILVER 50+MEN PO) Take 1 tablet by mouth every morning.    [provider]  nystatin (MYCOSTATIN/NYSTOP) powder Apply topically 2 (two) times daily. 07/25/17   Nafziger, Tommi Rumps, NP  omeprazole (PRILOSEC) 20 MG capsule TAKE 1 CAPSULE ON MONDAY,  WEDNESDAY, FRIDAY 03/04/18   Nafziger, Tommi Rumps, NP  simvastatin (ZOCOR) 10 MG tablet TAKE 1 TABLET BY MOUTH EVERY DAY AT BEDTIME 07/30/18   Nafziger, Tommi Rumps, NP  vitamin B-12 (CYANOCOBALAMIN) 1000 MCG tablet Take 2,000 mcg by mouth at bedtime.    [provider]    Family History Family History  Problem Relation Age of Onset  . Liver disease Brother        Died  . Seizures Neg Hx     Social History Social History   Tobacco Use  . Smoking status: Never Smoker  . Smokeless tobacco: Never Used  Substance Use Topics  . Alcohol use: Yes    Alcohol/week: 0.0 standard drinks    Comment: rum mixed in diet coke 3 a week  . Drug use: Yes    Types: Nitrous oxide     Allergies   Patient has no known allergies.   Review of Systems Review of Systems  Respiratory: Negative for shortness of breath.   Cardiovascular: Negative for chest pain.  Gastrointestinal: Negative for abdominal pain.  Musculoskeletal: Positive for arthralgias and gait problem. Negative for back pain, joint swelling, myalgias and neck stiffness.  Neurological: Negative for seizures, numbness and headaches.     Physical Exam Updated Vital Signs  ED Triage Vitals  Enc Vitals Group     BP 08/20/18 1823 107/75     Pulse Rate 08/20/18 1823 96     Resp 08/20/18 1823 18     Temp 08/20/18 1823 98 F (36.7 C)     Temp Source 08/20/18 1823 Oral     SpO2 08/20/18 1823 96 %     Weight --      Height --      Head Circumference --      Peak Flow --      Pain Score 08/20/18 1906 6     Pain Loc --      Pain Edu? --      Excl. in GC? --     Physical Exam Neck:     Musculoskeletal: Normal range of motion and neck supple.  Musculoskeletal: Normal range of motion.         General: Tenderness (right hip area ) present. No deformity or signs of injury.     Right lower leg: No edema.     Left lower leg: No edema.     Comments: No midline spinal tenderness   Skin:    General: Skin is warm and dry.     Capillary Refill: Capillary refill takes less than 2 seconds.  Neurological:     General: No focal deficit present.  ED Treatments / Results  Labs (all labs ordered are listed, but only abnormal results are displayed) Labs Reviewed - No data to display  EKG None  Radiology Dg Hip Unilat  With Pelvis 2-3 Views Right  Result Date: 08/20/2018 CLINICAL DATA:  Fall 2 days prior with right-sided hip pain EXAM: DG HIP (WITH OR WITHOUT PELVIS) 2-3V RIGHT COMPARISON:  None. FINDINGS: The SI joints are non widened. Pubic symphysis and rami are intact. Mild degenerative changes of both hips. No fracture or malalignment. IMPRESSION: No acute osseous abnormality. Electronically Signed   By: Donavan Foil M.D.   On: 08/20/2018 19:36    Procedures Procedures (including critical care time)  Medications Ordered in ED Medications - No data to display   Initial Impression / Assessment and Plan / ED Course  I have reviewed the triage vital signs and the nursing notes.  Pertinent labs & imaging results that were available during my care of the patient were reviewed by me and considered in my medical decision making (see chart for details).     Gerald Leach is an 85 year old male with history of hypertension, seizures, diabetes, dementia who presents to the ED with right hip pain after fall several days ago.  Mechanical fall, tripped while using cane, supposed to be using walker. Patient with normal vitals.  No fever.  Patient has been able to ambulate without any big issues over the last several days.  Does have some pain in the right hip area but is able to get around with his cane.  Family member forced him to come to the ED for an x-ray.  X-ray showed no  acute findings.  Patient is ambulatory in the room.  Has no midline spinal pain in the low back.  Did not hit his head or lose consciousness at that time.  Given reassurance.  Recommend Tylenol Motrin for pain and discharged in ED in good condition.  This chart was dictated using voice recognition software.  Despite best efforts to proofread,  errors can occur which can change the documentation meaning.    Final Clinical Impressions(s) / ED Diagnoses   Final diagnoses:  Right hip pain    ED Discharge Orders    None       Lennice Sites, DO 08/20/18 2028

## 2018-08-22 ENCOUNTER — Other Ambulatory Visit: Payer: Self-pay | Admitting: *Deleted

## 2018-08-22 ENCOUNTER — Telehealth: Payer: Self-pay | Admitting: Neurology

## 2018-08-22 ENCOUNTER — Telehealth: Payer: Self-pay | Admitting: Adult Health

## 2018-08-22 ENCOUNTER — Other Ambulatory Visit: Payer: Self-pay | Admitting: Adult Health

## 2018-08-22 DIAGNOSIS — R569 Unspecified convulsions: Secondary | ICD-10-CM

## 2018-08-22 MED ORDER — LEVETIRACETAM 750 MG PO TABS: 1500.0000 mg | ORAL_TABLET | Freq: Two times a day (BID) | ORAL | 3 refills | Status: DC

## 2018-08-22 MED ORDER — LACOSAMIDE 100 MG PO TABS: 1.0000 | ORAL_TABLET | Freq: Two times a day (BID) | ORAL | 3 refills | Status: DC

## 2018-08-22 MED ORDER — AMANTADINE HCL 100 MG PO CAPS: 100.0000 mg | ORAL_CAPSULE | Freq: Every day | ORAL | 3 refills | Status: DC

## 2018-08-22 NOTE — Telephone Encounter (Signed)
Rx request has already been pended for PCP review

## 2018-08-22 NOTE — Telephone Encounter (Signed)
Copied from [place] [id number]. Topic: Quick Communication - Rx Refill/Question >> Aug 22, 2018  4:02 PM Rayann Heman wrote: Medication:metFORMIN (GLUCOPHAGE) 500 MG tablet [[id number]]   Has the patient contacted their pharmacy? Yes need new rx  Preferred Pharmacy (with phone number or street name): [id number] [place], [place] [phone] (Phone) [phone] (Fax)  Agent: Please be advised that RX refills may take up to 3 business days. We ask that you follow-up with your pharmacy.

## 2018-08-22 NOTE — Telephone Encounter (Signed)
I spoke with [name] and she said that [name] is now living alone.  His wife is living in the [place] and his friend is "no longer in the picture".  Informed her [facility]

## 2018-08-22 NOTE — Telephone Encounter (Signed)
Patient has been getting meds thru mail order but he is [facility] [address]. Please send in the refill for the Zimpat, Levetiracetam, and the amantadine. Please also call her because she has some questions. Thanks!

## 2018-08-25 NOTE — Telephone Encounter (Signed)
Patient caretaker called to say that he is completely out of his metFORMIN (GLUCOPHAGE) 500 MG tablet asking for Rx to be sent to the   [id number] [place], [place] - [place] [phone] (Phone) [phone] (Fax)

## 2018-08-26 NOTE — Telephone Encounter (Addendum)
See telephone encounter.  LMOM for a return call

## 2018-08-26 NOTE — Telephone Encounter (Signed)
Left a message to call back.  Pt is past due for annual visit.  CRM created.

## 2018-08-27 NOTE — Telephone Encounter (Signed)
You 34 minutes ago (9:11 AM)      Tried reaching the pt on home, cell and work #.

## 2018-08-27 NOTE — Telephone Encounter (Signed)
Tried reaching the pt on home, cell and work #.

## 2018-08-28 ENCOUNTER — Other Ambulatory Visit: Payer: Self-pay | Admitting: Family Medicine

## 2018-08-28 MED ORDER — METFORMIN HCL 500 MG PO TABS: 500.0000 mg | ORAL_TABLET | Freq: Two times a day (BID) | ORAL | 0 refills | Status: DC

## 2018-08-28 NOTE — Telephone Encounter (Signed)
Pt son called and scheduled CPE for 09/18/18. Pt son would like medication sent in please.

## 2018-08-28 NOTE — Telephone Encounter (Signed)
DENIED.  PT IS PAST DUE FOR ANNUAL VISIT AND LAB WORK.

## 2018-08-28 NOTE — Telephone Encounter (Signed)
LMOM (home #) for a call back.

## 2018-08-28 NOTE — Telephone Encounter (Signed)
Sent to the pharmacy by e-scribe for 30 days.  Pt now on the schedule for cpx on 09/18/2018.

## 2018-08-28 NOTE — Telephone Encounter (Signed)
Will now close note.  Have made several attempts to reach the pt.

## 2018-09-11 ENCOUNTER — Other Ambulatory Visit: Payer: Self-pay | Admitting: Neurology

## 2018-09-14 ENCOUNTER — Other Ambulatory Visit: Payer: Self-pay | Admitting: Neurology

## 2018-09-14 DIAGNOSIS — R569 Unspecified convulsions: Secondary | ICD-10-CM

## 2018-09-18 ENCOUNTER — Encounter: Payer: Medicare HMO | Admitting: Adult Health

## 2018-09-19 ENCOUNTER — Ambulatory Visit: Payer: Medicare HMO | Admitting: Neurology

## 2018-09-21 ENCOUNTER — Other Ambulatory Visit: Payer: Self-pay | Admitting: Adult Health

## 2018-09-22 NOTE — Telephone Encounter (Signed)
Gerald Leach would you like to get lab work and then try for A1C virtual follow up or just get lab work?

## 2018-09-23 NOTE — Telephone Encounter (Signed)
At this point just get lab work, is probably going to be easier.  Okay to send in medications for 90 days

## 2018-09-23 NOTE — Telephone Encounter (Signed)
Left a message for a return call.

## 2018-09-30 ENCOUNTER — Encounter: Payer: Self-pay | Admitting: Family Medicine

## 2018-09-30 NOTE — Telephone Encounter (Signed)
Sent to the pharmacy by e-scribe. 

## 2018-11-13 DIAGNOSIS — E1151 Type 2 diabetes mellitus with diabetic peripheral angiopathy without gangrene: Secondary | ICD-10-CM | POA: Diagnosis not present

## 2018-11-13 DIAGNOSIS — M79672 Pain in left foot: Secondary | ICD-10-CM | POA: Diagnosis not present

## 2018-11-13 DIAGNOSIS — L97529 Non-pressure chronic ulcer of other part of left foot with unspecified severity: Secondary | ICD-10-CM | POA: Diagnosis not present

## 2018-11-13 DIAGNOSIS — B351 Tinea unguium: Secondary | ICD-10-CM | POA: Diagnosis not present

## 2018-11-13 DIAGNOSIS — M79671 Pain in right foot: Secondary | ICD-10-CM | POA: Diagnosis not present

## 2018-11-13 DIAGNOSIS — L84 Corns and callosities: Secondary | ICD-10-CM | POA: Diagnosis not present

## 2018-11-19 ENCOUNTER — Encounter: Payer: Self-pay | Admitting: Neurology

## 2018-11-22 ENCOUNTER — Other Ambulatory Visit: Payer: Self-pay | Admitting: Neurology

## 2018-11-28 ENCOUNTER — Ambulatory Visit: Payer: Medicare HMO | Admitting: Neurology

## 2019-01-01 ENCOUNTER — Other Ambulatory Visit: Payer: Self-pay | Admitting: Adult Health

## 2019-01-02 ENCOUNTER — Ambulatory Visit (INDEPENDENT_AMBULATORY_CARE_PROVIDER_SITE_OTHER): Payer: Medicare HMO | Admitting: Adult Health

## 2019-01-02 ENCOUNTER — Other Ambulatory Visit: Payer: Self-pay

## 2019-01-02 ENCOUNTER — Encounter: Payer: Self-pay | Admitting: Adult Health

## 2019-01-02 VITALS — BP 126/66 | Temp 97.6°F | Wt 154.0 lb

## 2019-01-02 DIAGNOSIS — G40909 Epilepsy, unspecified, not intractable, without status epilepticus: Secondary | ICD-10-CM | POA: Diagnosis not present

## 2019-01-02 DIAGNOSIS — E032 Hypothyroidism due to medicaments and other exogenous substances: Secondary | ICD-10-CM

## 2019-01-02 DIAGNOSIS — E119 Type 2 diabetes mellitus without complications: Secondary | ICD-10-CM | POA: Diagnosis not present

## 2019-01-02 DIAGNOSIS — W19XXXA Unspecified fall, initial encounter: Secondary | ICD-10-CM | POA: Diagnosis not present

## 2019-01-02 DIAGNOSIS — Z0000 Encounter for general adult medical examination without abnormal findings: Secondary | ICD-10-CM | POA: Diagnosis not present

## 2019-01-02 DIAGNOSIS — E783 Hyperchylomicronemia: Secondary | ICD-10-CM | POA: Diagnosis not present

## 2019-01-02 DIAGNOSIS — R634 Abnormal weight loss: Secondary | ICD-10-CM | POA: Diagnosis not present

## 2019-01-02 DIAGNOSIS — Y92009 Unspecified place in unspecified non-institutional (private) residence as the place of occurrence of the external cause: Secondary | ICD-10-CM | POA: Diagnosis not present

## 2019-01-02 DIAGNOSIS — R2681 Unsteadiness on feet: Secondary | ICD-10-CM | POA: Diagnosis not present

## 2019-01-02 LAB — HEMOGLOBIN A1C: Hgb A1c MFr Bld: 5.7 % (ref 4.6–6.5)

## 2019-01-02 LAB — CBC WITH DIFFERENTIAL/PLATELET
Basophils Absolute: 0 10*3/uL (ref 0.0–0.1)
Basophils Relative: 0.6 % (ref 0.0–3.0)
Eosinophils Absolute: 0.1 10*3/uL (ref 0.0–0.7)
Eosinophils Relative: 0.8 % (ref 0.0–5.0)
HCT: 45.3 % (ref 39.0–52.0)
Hemoglobin: 15 g/dL (ref 13.0–17.0)
Lymphocytes Relative: 30.5 % (ref 12.0–46.0)
Lymphs Abs: 2.2 10*3/uL (ref 0.7–4.0)
MCHC: 33 g/dL (ref 30.0–36.0)
MCV: 88.8 fl (ref 78.0–100.0)
Monocytes Absolute: 0.6 10*3/uL (ref 0.1–1.0)
Monocytes Relative: 8.8 % (ref 3.0–12.0)
Neutro Abs: 4.3 10*3/uL (ref 1.4–7.7)
Neutrophils Relative %: 59.3 % (ref 43.0–77.0)
Platelets: 262 10*3/uL (ref 150.0–400.0)
RBC: 5.1 Mil/uL (ref 4.22–5.81)
RDW: 13.5 % (ref 11.5–15.5)
WBC: 7.3 10*3/uL (ref 4.0–10.5)

## 2019-01-02 LAB — LIPID PANEL
Cholesterol: 177 mg/dL (ref 0–200)
HDL: 37.4 mg/dL — ABNORMAL LOW (ref >39.00)
LDL Cholesterol: 106 mg/dL — ABNORMAL HIGH (ref 0–99)
NonHDL: 139.32
Total CHOL/HDL Ratio: 5
Triglycerides: 169 mg/dL — ABNORMAL HIGH (ref 0.0–149.0)
VLDL: 33.8 mg/dL (ref 0.0–40.0)

## 2019-01-02 LAB — COMPREHENSIVE METABOLIC PANEL
ALT: 23 U/L (ref 0–53)
AST: 17 U/L (ref 0–37)
Albumin: 4.2 g/dL (ref 3.5–5.2)
Alkaline Phosphatase: 119 U/L — ABNORMAL HIGH (ref 39–117)
BUN: 11 mg/dL (ref 6–23)
CO2: 31 mEq/L (ref 19–32)
Calcium: 9.3 mg/dL (ref 8.4–10.5)
Chloride: 99 mEq/L (ref 96–112)
Creatinine, Ser: 0.67 mg/dL (ref 0.40–1.50)
GFR: 113.23 mL/min (ref >60.00)
Glucose, Bld: 83 mg/dL (ref 70–99)
Potassium: 4.8 mEq/L (ref 3.5–5.1)
Sodium: 137 mEq/L (ref 135–145)
Total Bilirubin: 0.6 mg/dL (ref 0.2–1.2)
Total Protein: 6.9 g/dL (ref 6.0–8.3)

## 2019-01-02 LAB — TSH: TSH: 11.17 u[IU]/mL — ABNORMAL HIGH (ref 0.35–4.50)

## 2019-01-02 NOTE — Progress Notes (Signed)
Subjective:    [name], male    DOB: May 11, 1934, 85 y.o.   MRN: [id number]  HPI Patient presents for yearly preventative medicine examination. He is a pleasant 85 year old male who  has a past medical history of Brain aneurysm, Colonic polyp (2003), Dementia ([place]), Diabetes mellitus without complication ([place]), ED (erectile dysfunction), GERD (gastroesophageal reflux disease), Hyperlipidemia, Hypertension, Hypothyroidism, Seizures ([place]), and Seizures ([place]).  His daughter is with him today to help provide history   He continues to live at home by himself.  His daughter and son visit as much as possible but they are unable to get there as much as he would like.  They are concerned about his safety living at home but he refuses to go to a facility.  His wife is in [facility].  Hypothyroidism - is controlled with Synthroid 75 mcg  Lab Results  Component Value Date   TSH 2.341 03/05/2018   Diabetes Mellitus - Takes Metformin 500 mg BID.  He does not check his blood sugars on a routine basis.  Most meals consist of cereal and milk.  Family has been providing meal replacement shakes for him. Lab Results  Component Value Date   HGBA1C 6.2 07/29/2017   Hyperlipidemia - Takes simvastatin 10 mg daily  Lab Results  Component Value Date   CHOL 124 07/29/2017   HDL 35.10 (L) 07/29/2017   LDLCALC 66 07/29/2017   TRIG 115.0 07/29/2017   CHOLHDL 4 07/29/2017   Epilepsy - is followed by Neurology. History seizures due to SDH currently taking keppra 1500 mg BID and Vimpat 100 mg BID. No new seizures.   Dementia - Neurology feels as though it is multifactorial. Interferes with is IADLs and ADLs. He was continued on amantidine 100 mg daily.   Gait Instability - has been a chronic problem. His daughter feels as through his gait has become worse. He has had three recent falls none resulted in injury.  He does have known large fiber neuropathy and thoracic canal  stenosis.  His shuffling gait and blunted affect raise concern for Parkinsonsim.  Neurology is trialing him on Sinemet.  Weight Loss-he has had weight loss of approximately 30 pounds over the last 7 months. His family reports that his diet is poor but does not endorse anorexia. His diet consists of cereal and milk because he does not want to cook and " it is easy". Family has recently started giving the patient meal replacement shakes  Wt Readings from Last 3 Encounters:  01/02/19 154 lb (69.9 kg)  06/20/18 182 lb (82.6 kg)  03/11/18 173 lb 12.8 oz (78.8 kg)   All immunizations and health maintenance protocols were reviewed with the patient and needed orders were placed. UTD on vaccinations   Appropriate screening laboratory values were ordered for the patient including screening of hyperlipidemia, renal function and hepatic function. If indicated by BPH, a PSA was ordered.  Medication reconciliation,  past medical history, social history, problem list and allergies were reviewed in detail with the patient  Goals were established with regard to weight loss, exercise, and  diet in compliance with medications  End of life planning was discussed.   Review of Systems  Constitutional: Positive for activity change.  HENT: Positive for hearing loss.   Eyes: Negative.   Respiratory: Negative.   Cardiovascular: Negative.   Gastrointestinal: Negative.   Endocrine: Negative.   Genitourinary: Negative.   Musculoskeletal: Positive for gait  problem.  Skin: Negative.   Psychiatric/Behavioral: Positive for confusion.  All other systems reviewed and are negative.    Past Medical History:  Diagnosis Date  . Brain aneurysm   . Colonic polyp 2003  . Dementia ([place])   . Diabetes mellitus without complication ([place])   . ED (erectile dysfunction)   . GERD (gastroesophageal reflux disease)   . Hyperlipidemia   . Hypertension   . Hypothyroidism   . Seizures ([place])    per family  . Seizures ([place])      Social History   Socioeconomic History  . Marital status: Married    Spouse name: Not on file  . Number of children: 2  . Years of education: Not on file  . Highest education level: Not on file  Occupational History  . Not on file  Social Needs  . Financial resource strain: Not on file  . Food insecurity    Worry: Not on file    Inability: Not on file  . Transportation needs    Medical: Not on file    Non-medical: Not on file  Tobacco Use  . Smoking status: Never Smoker  . Smokeless tobacco: Never Used  Substance and Sexual Activity  . Alcohol use: Yes    Alcohol/week: 0.0 standard drinks    Comment: rum mixed in diet coke 3 a week  . Drug use: Yes    Types: Nitrous oxide  . Sexual activity: Not on file  Lifestyle  . Physical activity    Days per week: Not on file    Minutes per session: Not on file  . Stress: Not on file  Relationships  . Social Herbalist on phone: Not on file    Gets together: Not on file    Attends religious service: Not on file    Active member of club or organization: Not on file    Attends meetings of clubs or organizations: Not on file    Relationship status: Not on file  . Intimate partner violence    Fear of current or ex partner: Not on file    Emotionally abused: Not on file    Physically abused: Not on file    Forced sexual activity: Not on file  Other Topics Concern  . Not on file  Social History Narrative   ** Merged History Encounter **       Lives with wife in a 2 story home.  Has no trouble with the stairs as long as he holds on to the banister.  Education: college.  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      Past Surgical History:  Procedure Laterality Date  . Aneurysmal clipping    . brain aneurysm surgery     at [facility]  . carotid artery aneurysm    . COLONOSCOPY     polyps  . CRANIOTOMY Right 09/19/2012   Procedure: CRANIOTOMY HEMATOMA EVACUATION SUBDURAL;  Surgeon: Otilio Connors, MD;  Location: [place] NEURO ORS;  Service: Neurosurgery;  Laterality: Right;  . CRANIOTOMY Right 09/22/2012   Procedure: CRANIOTOMY HEMATOMA EVACUATION SUBDURAL;  Surgeon: Otilio Connors, MD;  Location: [place] NEURO ORS;  Service: Neurosurgery;  Laterality: Right;  . Craniotomy.    . right hernia      Family History  Problem Relation Age of Onset  . Liver disease Brother        Died  . Seizures Neg Hx     No  Known Allergies  Current Outpatient Medications on File Prior to Visit  Medication Sig Dispense Refill  . amantadine (SYMMETREL) 100 MG capsule Take 1 capsule (100 mg total) by mouth daily. 90 capsule 3  . carbidopa-levodopa (SINEMET IR) 25-100 MG tablet TAKE 1 TABLET 30-MINUTE PRIOR TO MEALS THREE TIMES DAILY. 90 tablet 5  . ferrous sulfate 325 (65 FE) MG tablet Take 325 mg by mouth at bedtime.    Marland Kitchen glucose blood (ONETOUCH VERIO) test strip Use once daily Dx E11.9 100 each 12  . Lacosamide (VIMPAT) 100 MG TABS Take 1 tablet (100 mg total) by mouth 2 (two) times daily. 180 tablet 3  . Lancets (ONETOUCH ULTRASOFT) lancets Use as instructed 100 each 12  . levETIRAcetam (KEPPRA) 750 MG tablet Take 2 tablets (1,500 mg total) by mouth 2 (two) times daily. 360 tablet 3  . levothyroxine (SYNTHROID, LEVOTHROID) 75 MCG tablet TAKE 1 TABLET DAILY 90 tablet 3  . metFORMIN (GLUCOPHAGE) 500 MG tablet TAKE 1 TABLET (500 MG TOTAL) BY MOUTH 2 (TWO) TIMES DAILY WITH A MEAL. 180 tablet 0  . Multiple Vitamins-Minerals (CENTRUM SILVER 50+MEN PO) Take 1 tablet by mouth every morning.    . nystatin (MYCOSTATIN/NYSTOP) powder Apply topically 2 (two) times daily. 45 g 3  . omeprazole (PRILOSEC) 20 MG capsule TAKE 1 CAPSULE ON MONDAY,  WEDNESDAY, FRIDAY 39 capsule 1  . simvastatin (ZOCOR) 10 MG tablet TAKE 1 TABLET BY MOUTH EVERY DAY AT BEDTIME 90 tablet 0  . vitamin B-12 (CYANOCOBALAMIN) 1000 MCG tablet Take 2,000 mcg by mouth at bedtime.     No current facility-administered medications on file prior to  visit.     BP 126/66   Temp 97.6 F (36.4 C)   Wt 154 lb (69.9 kg)   BMI 21.78 kg/m       Objective:   Physical Exam Vitals signs and nursing note reviewed.  Constitutional:      Appearance: Normal appearance.  HENT:     Head: Normocephalic and atraumatic.     Right Ear: Tympanic membrane, ear canal and external ear normal. There is no impacted cerumen.     Left Ear: Tympanic membrane, ear canal and external ear normal. There is no impacted cerumen.     Nose: Nose normal. No congestion or rhinorrhea.     Mouth/Throat:     Mouth: Mucous membranes are moist.     Pharynx: Oropharynx is clear. No oropharyngeal exudate or posterior oropharyngeal erythema.  Eyes:     Extraocular Movements: Extraocular movements intact.     Pupils: Pupils are equal, round, and reactive to light.  Neck:     Musculoskeletal: Normal range of motion and neck supple.  Cardiovascular:     Rate and Rhythm: Normal rate and regular rhythm.     Pulses: Normal pulses.     Heart sounds: Normal heart sounds.  Pulmonary:     Effort: Pulmonary effort is normal.     Breath sounds: Normal breath sounds.  Abdominal:     General: Abdomen is flat. Bowel sounds are normal. There is no distension.     Palpations: Abdomen is soft. There is no mass.     Tenderness: There is no abdominal tenderness. There is no rebound.     Hernia: No hernia is present.  Musculoskeletal: Normal range of motion.  Skin:    General: Skin is warm and dry.     Capillary Refill: Capillary refill takes less than 2 seconds.  Neurological:  Mental Status: He is alert and oriented to person, place, and time. Mental status is at baseline.     Motor: Weakness present. No tremor.     Coordination: Coordination abnormal.     Gait: Gait abnormal.     Comments: Slow shuffling gait. Uses cane.   Is able to answer simple questions and perform simple commands       Assessment & Plan:  1. Routine general medical examination at a health care  facility  - CBC with Differential/Platelet - Comprehensive metabolic panel - Hemoglobin A1c - Lipid panel - TSH  2. Seizure disorder (HCC) - Continue with Neurology plan of care   3. Gait instability - Will refer to home health.  - Advised family that he would be safer in a facility and they agreed. Patient did not want to discuss at this time, states " I will think about it.". It may take family just placing him.  - Ambulatory referral to [place]  4. Controlled type 2 diabetes mellitus without complication, without long-term current use of insulin ([place]) - Consider change in metformin  - CBC with Differential/Platelet - Comprehensive metabolic panel - Hemoglobin A1c - Lipid panel - TSH  5. Hypothyroidism due to non-medication exogenous substances - Consider chance in synthroid - CBC with Differential/Platelet - Comprehensive metabolic panel - Hemoglobin A1c - Lipid panel - TSH  6. Fall in home, initial encounter  - Ambulatory referral to [place]  7. Hyperlipidemia, group D  - CBC with Differential/Platelet - Comprehensive metabolic panel - Hemoglobin A1c - Lipid panel - TSH  8. Unintentional weight loss - Encouraged Glucerna as meal replacement  - Needs more than cereal and milk in diet. Again, would be better suited in facility.  - CBC with Differential/Platelet - Comprehensive metabolic panel - Hemoglobin A1c - Lipid panel - TSH   [name], NP

## 2019-01-05 ENCOUNTER — Telehealth: Payer: Self-pay | Admitting: Adult Health

## 2019-01-05 NOTE — Telephone Encounter (Signed)
Copied from [place] [id number]. Topic: Quick Communication - Rx Refill/Question >> Jan 05, 2019 12:46 PM Nils Flack, Marland Kitchen wrote: Medication: metFORMIN (GLUCOPHAGE) 500 MG tablet Has the patient contacted their pharmacy? yes (Agent: If no, request that the patient contact the pharmacy for the refill.) (Agent: If yes, when and what did the pharmacy advise?)  Preferred Pharmacy (with phone number or street name): [facility]  Agent: Please be advised that RX refills may take up to 3 business days. We ask that you follow-up with your pharmacy.

## 2019-01-06 ENCOUNTER — Other Ambulatory Visit: Payer: Self-pay | Admitting: Adult Health

## 2019-01-06 DIAGNOSIS — E032 Hypothyroidism due to medicaments and other exogenous substances: Secondary | ICD-10-CM

## 2019-01-06 MED ORDER — LEVOTHYROXINE SODIUM 75 MCG PO TABS: 75.0000 ug | ORAL_TABLET | Freq: Every day | ORAL | 3 refills | Status: DC

## 2019-01-06 NOTE — Telephone Encounter (Signed)
Called and spoke to Rob at the pharmacy.  Prescription was received on 01/06/2019.  Nothing further needed.

## 2019-01-11 DIAGNOSIS — G20 Parkinson's disease: Secondary | ICD-10-CM | POA: Diagnosis not present

## 2019-01-11 DIAGNOSIS — E785 Hyperlipidemia, unspecified: Secondary | ICD-10-CM | POA: Diagnosis not present

## 2019-01-11 DIAGNOSIS — L570 Actinic keratosis: Secondary | ICD-10-CM | POA: Diagnosis not present

## 2019-01-11 DIAGNOSIS — K219 Gastro-esophageal reflux disease without esophagitis: Secondary | ICD-10-CM | POA: Diagnosis not present

## 2019-01-11 DIAGNOSIS — M4804 Spinal stenosis, thoracic region: Secondary | ICD-10-CM | POA: Diagnosis not present

## 2019-01-11 DIAGNOSIS — E039 Hypothyroidism, unspecified: Secondary | ICD-10-CM | POA: Diagnosis not present

## 2019-01-11 DIAGNOSIS — E1140 Type 2 diabetes mellitus with diabetic neuropathy, unspecified: Secondary | ICD-10-CM | POA: Diagnosis not present

## 2019-01-11 DIAGNOSIS — I10 Essential (primary) hypertension: Secondary | ICD-10-CM | POA: Diagnosis not present

## 2019-01-11 DIAGNOSIS — R69 Illness, unspecified: Secondary | ICD-10-CM | POA: Diagnosis not present

## 2019-01-11 DIAGNOSIS — G40409 Other generalized epilepsy and epileptic syndromes, not intractable, without status epilepticus: Secondary | ICD-10-CM | POA: Diagnosis not present

## 2019-01-13 ENCOUNTER — Encounter: Payer: Medicare HMO | Admitting: Adult Health

## 2019-01-14 ENCOUNTER — Encounter: Payer: Self-pay | Admitting: Family Medicine

## 2019-01-19 DIAGNOSIS — K219 Gastro-esophageal reflux disease without esophagitis: Secondary | ICD-10-CM | POA: Diagnosis not present

## 2019-01-19 DIAGNOSIS — I10 Essential (primary) hypertension: Secondary | ICD-10-CM | POA: Diagnosis not present

## 2019-01-19 DIAGNOSIS — G20 Parkinson's disease: Secondary | ICD-10-CM | POA: Diagnosis not present

## 2019-01-19 DIAGNOSIS — G40409 Other generalized epilepsy and epileptic syndromes, not intractable, without status epilepticus: Secondary | ICD-10-CM | POA: Diagnosis not present

## 2019-01-19 DIAGNOSIS — R69 Illness, unspecified: Secondary | ICD-10-CM | POA: Diagnosis not present

## 2019-01-19 DIAGNOSIS — E039 Hypothyroidism, unspecified: Secondary | ICD-10-CM | POA: Diagnosis not present

## 2019-01-19 DIAGNOSIS — L570 Actinic keratosis: Secondary | ICD-10-CM | POA: Diagnosis not present

## 2019-01-19 DIAGNOSIS — M4804 Spinal stenosis, thoracic region: Secondary | ICD-10-CM | POA: Diagnosis not present

## 2019-01-19 DIAGNOSIS — E785 Hyperlipidemia, unspecified: Secondary | ICD-10-CM | POA: Diagnosis not present

## 2019-01-19 DIAGNOSIS — E1140 Type 2 diabetes mellitus with diabetic neuropathy, unspecified: Secondary | ICD-10-CM | POA: Diagnosis not present

## 2019-01-20 DIAGNOSIS — K219 Gastro-esophageal reflux disease without esophagitis: Secondary | ICD-10-CM | POA: Diagnosis not present

## 2019-01-20 DIAGNOSIS — G40409 Other generalized epilepsy and epileptic syndromes, not intractable, without status epilepticus: Secondary | ICD-10-CM | POA: Diagnosis not present

## 2019-01-20 DIAGNOSIS — R69 Illness, unspecified: Secondary | ICD-10-CM | POA: Diagnosis not present

## 2019-01-20 DIAGNOSIS — L570 Actinic keratosis: Secondary | ICD-10-CM | POA: Diagnosis not present

## 2019-01-20 DIAGNOSIS — M4804 Spinal stenosis, thoracic region: Secondary | ICD-10-CM | POA: Diagnosis not present

## 2019-01-20 DIAGNOSIS — E039 Hypothyroidism, unspecified: Secondary | ICD-10-CM | POA: Diagnosis not present

## 2019-01-20 DIAGNOSIS — G20 Parkinson's disease: Secondary | ICD-10-CM | POA: Diagnosis not present

## 2019-01-20 DIAGNOSIS — I10 Essential (primary) hypertension: Secondary | ICD-10-CM | POA: Diagnosis not present

## 2019-01-20 DIAGNOSIS — E785 Hyperlipidemia, unspecified: Secondary | ICD-10-CM | POA: Diagnosis not present

## 2019-01-20 DIAGNOSIS — E1140 Type 2 diabetes mellitus with diabetic neuropathy, unspecified: Secondary | ICD-10-CM | POA: Diagnosis not present

## 2019-01-21 DIAGNOSIS — E1140 Type 2 diabetes mellitus with diabetic neuropathy, unspecified: Secondary | ICD-10-CM | POA: Diagnosis not present

## 2019-01-21 DIAGNOSIS — G40409 Other generalized epilepsy and epileptic syndromes, not intractable, without status epilepticus: Secondary | ICD-10-CM | POA: Diagnosis not present

## 2019-01-21 DIAGNOSIS — R69 Illness, unspecified: Secondary | ICD-10-CM | POA: Diagnosis not present

## 2019-01-21 DIAGNOSIS — I10 Essential (primary) hypertension: Secondary | ICD-10-CM | POA: Diagnosis not present

## 2019-01-21 DIAGNOSIS — M4804 Spinal stenosis, thoracic region: Secondary | ICD-10-CM | POA: Diagnosis not present

## 2019-01-21 DIAGNOSIS — K219 Gastro-esophageal reflux disease without esophagitis: Secondary | ICD-10-CM | POA: Diagnosis not present

## 2019-01-21 DIAGNOSIS — L570 Actinic keratosis: Secondary | ICD-10-CM | POA: Diagnosis not present

## 2019-01-21 DIAGNOSIS — G20 Parkinson's disease: Secondary | ICD-10-CM | POA: Diagnosis not present

## 2019-01-21 DIAGNOSIS — E785 Hyperlipidemia, unspecified: Secondary | ICD-10-CM | POA: Diagnosis not present

## 2019-01-21 DIAGNOSIS — E039 Hypothyroidism, unspecified: Secondary | ICD-10-CM | POA: Diagnosis not present

## 2019-01-22 DIAGNOSIS — E1151 Type 2 diabetes mellitus with diabetic peripheral angiopathy without gangrene: Secondary | ICD-10-CM | POA: Diagnosis not present

## 2019-01-22 DIAGNOSIS — M2041 Other hammer toe(s) (acquired), right foot: Secondary | ICD-10-CM | POA: Diagnosis not present

## 2019-01-22 DIAGNOSIS — L97529 Non-pressure chronic ulcer of other part of left foot with unspecified severity: Secondary | ICD-10-CM | POA: Diagnosis not present

## 2019-01-22 DIAGNOSIS — B351 Tinea unguium: Secondary | ICD-10-CM | POA: Diagnosis not present

## 2019-01-22 DIAGNOSIS — M2042 Other hammer toe(s) (acquired), left foot: Secondary | ICD-10-CM | POA: Diagnosis not present

## 2019-01-22 DIAGNOSIS — L84 Corns and callosities: Secondary | ICD-10-CM | POA: Diagnosis not present

## 2019-01-23 DIAGNOSIS — L570 Actinic keratosis: Secondary | ICD-10-CM | POA: Diagnosis not present

## 2019-01-23 DIAGNOSIS — E1140 Type 2 diabetes mellitus with diabetic neuropathy, unspecified: Secondary | ICD-10-CM | POA: Diagnosis not present

## 2019-01-23 DIAGNOSIS — K219 Gastro-esophageal reflux disease without esophagitis: Secondary | ICD-10-CM | POA: Diagnosis not present

## 2019-01-23 DIAGNOSIS — M4804 Spinal stenosis, thoracic region: Secondary | ICD-10-CM | POA: Diagnosis not present

## 2019-01-23 DIAGNOSIS — G40409 Other generalized epilepsy and epileptic syndromes, not intractable, without status epilepticus: Secondary | ICD-10-CM | POA: Diagnosis not present

## 2019-01-23 DIAGNOSIS — G20 Parkinson's disease: Secondary | ICD-10-CM | POA: Diagnosis not present

## 2019-01-23 DIAGNOSIS — I10 Essential (primary) hypertension: Secondary | ICD-10-CM | POA: Diagnosis not present

## 2019-01-23 DIAGNOSIS — R69 Illness, unspecified: Secondary | ICD-10-CM | POA: Diagnosis not present

## 2019-01-23 DIAGNOSIS — E785 Hyperlipidemia, unspecified: Secondary | ICD-10-CM | POA: Diagnosis not present

## 2019-01-23 DIAGNOSIS — E039 Hypothyroidism, unspecified: Secondary | ICD-10-CM | POA: Diagnosis not present

## 2019-01-26 ENCOUNTER — Telehealth: Payer: Self-pay | Admitting: Adult Health

## 2019-01-26 NOTE — Telephone Encounter (Signed)
Son calling to follow up on order from [facility] for a Rolator and a beside commode for the pt.  He was advised [facility] sent a few weeks ago. Son Gerald Leach would like a call back if you have gotten or not

## 2019-01-27 DIAGNOSIS — G20 Parkinson's disease: Secondary | ICD-10-CM | POA: Diagnosis not present

## 2019-01-27 DIAGNOSIS — E1140 Type 2 diabetes mellitus with diabetic neuropathy, unspecified: Secondary | ICD-10-CM | POA: Diagnosis not present

## 2019-01-27 DIAGNOSIS — E785 Hyperlipidemia, unspecified: Secondary | ICD-10-CM | POA: Diagnosis not present

## 2019-01-27 DIAGNOSIS — G40409 Other generalized epilepsy and epileptic syndromes, not intractable, without status epilepticus: Secondary | ICD-10-CM | POA: Diagnosis not present

## 2019-01-27 DIAGNOSIS — M4804 Spinal stenosis, thoracic region: Secondary | ICD-10-CM | POA: Diagnosis not present

## 2019-01-27 DIAGNOSIS — I10 Essential (primary) hypertension: Secondary | ICD-10-CM | POA: Diagnosis not present

## 2019-01-27 DIAGNOSIS — R69 Illness, unspecified: Secondary | ICD-10-CM | POA: Diagnosis not present

## 2019-01-27 DIAGNOSIS — L570 Actinic keratosis: Secondary | ICD-10-CM | POA: Diagnosis not present

## 2019-01-27 DIAGNOSIS — E039 Hypothyroidism, unspecified: Secondary | ICD-10-CM | POA: Diagnosis not present

## 2019-01-27 DIAGNOSIS — K219 Gastro-esophageal reflux disease without esophagitis: Secondary | ICD-10-CM | POA: Diagnosis not present

## 2019-01-27 NOTE — Telephone Encounter (Signed)
I havent received anything from [facility]

## 2019-01-27 NOTE — Telephone Encounter (Signed)
Called and spoke to [place].  Advised that we have not received form from [facility].  [name] to call [facility] and have them refax.  Nothing further needed.

## 2019-01-29 ENCOUNTER — Telehealth: Payer: Self-pay | Admitting: Adult Health

## 2019-01-29 DIAGNOSIS — E785 Hyperlipidemia, unspecified: Secondary | ICD-10-CM | POA: Diagnosis not present

## 2019-01-29 DIAGNOSIS — L570 Actinic keratosis: Secondary | ICD-10-CM | POA: Diagnosis not present

## 2019-01-29 DIAGNOSIS — G20 Parkinson's disease: Secondary | ICD-10-CM | POA: Diagnosis not present

## 2019-01-29 DIAGNOSIS — M4804 Spinal stenosis, thoracic region: Secondary | ICD-10-CM | POA: Diagnosis not present

## 2019-01-29 DIAGNOSIS — E039 Hypothyroidism, unspecified: Secondary | ICD-10-CM | POA: Diagnosis not present

## 2019-01-29 DIAGNOSIS — E1140 Type 2 diabetes mellitus with diabetic neuropathy, unspecified: Secondary | ICD-10-CM | POA: Diagnosis not present

## 2019-01-29 DIAGNOSIS — I10 Essential (primary) hypertension: Secondary | ICD-10-CM | POA: Diagnosis not present

## 2019-01-29 DIAGNOSIS — R69 Illness, unspecified: Secondary | ICD-10-CM | POA: Diagnosis not present

## 2019-01-29 DIAGNOSIS — G40409 Other generalized epilepsy and epileptic syndromes, not intractable, without status epilepticus: Secondary | ICD-10-CM | POA: Diagnosis not present

## 2019-01-29 DIAGNOSIS — K219 Gastro-esophageal reflux disease without esophagitis: Secondary | ICD-10-CM | POA: Diagnosis not present

## 2019-01-29 NOTE — Telephone Encounter (Signed)
Verbal okay given.  

## 2019-01-29 NOTE — Telephone Encounter (Signed)
Home Health Verbal Orders - Caller/Agency: [facility]  Callback Number: [phone] Requesting OT/PT/Skilled Nursing/Social Work/Speech Therapy: OT  Frequency: need to move OT eval visit to next week, pt refused visit Leave message if no answer

## 2019-02-02 DIAGNOSIS — G20 Parkinson's disease: Secondary | ICD-10-CM | POA: Diagnosis not present

## 2019-02-02 DIAGNOSIS — M4804 Spinal stenosis, thoracic region: Secondary | ICD-10-CM | POA: Diagnosis not present

## 2019-02-02 DIAGNOSIS — K219 Gastro-esophageal reflux disease without esophagitis: Secondary | ICD-10-CM | POA: Diagnosis not present

## 2019-02-02 DIAGNOSIS — R69 Illness, unspecified: Secondary | ICD-10-CM | POA: Diagnosis not present

## 2019-02-02 DIAGNOSIS — G40409 Other generalized epilepsy and epileptic syndromes, not intractable, without status epilepticus: Secondary | ICD-10-CM | POA: Diagnosis not present

## 2019-02-02 DIAGNOSIS — E785 Hyperlipidemia, unspecified: Secondary | ICD-10-CM | POA: Diagnosis not present

## 2019-02-02 DIAGNOSIS — E1140 Type 2 diabetes mellitus with diabetic neuropathy, unspecified: Secondary | ICD-10-CM | POA: Diagnosis not present

## 2019-02-02 DIAGNOSIS — L570 Actinic keratosis: Secondary | ICD-10-CM | POA: Diagnosis not present

## 2019-02-02 DIAGNOSIS — E039 Hypothyroidism, unspecified: Secondary | ICD-10-CM | POA: Diagnosis not present

## 2019-02-02 DIAGNOSIS — I10 Essential (primary) hypertension: Secondary | ICD-10-CM | POA: Diagnosis not present

## 2019-02-03 DIAGNOSIS — E785 Hyperlipidemia, unspecified: Secondary | ICD-10-CM | POA: Diagnosis not present

## 2019-02-03 DIAGNOSIS — K219 Gastro-esophageal reflux disease without esophagitis: Secondary | ICD-10-CM | POA: Diagnosis not present

## 2019-02-03 DIAGNOSIS — I10 Essential (primary) hypertension: Secondary | ICD-10-CM | POA: Diagnosis not present

## 2019-02-03 DIAGNOSIS — R69 Illness, unspecified: Secondary | ICD-10-CM | POA: Diagnosis not present

## 2019-02-03 DIAGNOSIS — E1140 Type 2 diabetes mellitus with diabetic neuropathy, unspecified: Secondary | ICD-10-CM | POA: Diagnosis not present

## 2019-02-03 DIAGNOSIS — M4804 Spinal stenosis, thoracic region: Secondary | ICD-10-CM | POA: Diagnosis not present

## 2019-02-03 DIAGNOSIS — G40409 Other generalized epilepsy and epileptic syndromes, not intractable, without status epilepticus: Secondary | ICD-10-CM | POA: Diagnosis not present

## 2019-02-03 DIAGNOSIS — E039 Hypothyroidism, unspecified: Secondary | ICD-10-CM | POA: Diagnosis not present

## 2019-02-03 DIAGNOSIS — L570 Actinic keratosis: Secondary | ICD-10-CM | POA: Diagnosis not present

## 2019-02-03 DIAGNOSIS — G20 Parkinson's disease: Secondary | ICD-10-CM | POA: Diagnosis not present

## 2019-02-05 DIAGNOSIS — R69 Illness, unspecified: Secondary | ICD-10-CM | POA: Diagnosis not present

## 2019-02-05 DIAGNOSIS — L570 Actinic keratosis: Secondary | ICD-10-CM | POA: Diagnosis not present

## 2019-02-05 DIAGNOSIS — G40409 Other generalized epilepsy and epileptic syndromes, not intractable, without status epilepticus: Secondary | ICD-10-CM | POA: Diagnosis not present

## 2019-02-05 DIAGNOSIS — M4804 Spinal stenosis, thoracic region: Secondary | ICD-10-CM | POA: Diagnosis not present

## 2019-02-05 DIAGNOSIS — K219 Gastro-esophageal reflux disease without esophagitis: Secondary | ICD-10-CM | POA: Diagnosis not present

## 2019-02-05 DIAGNOSIS — G20 Parkinson's disease: Secondary | ICD-10-CM | POA: Diagnosis not present

## 2019-02-05 DIAGNOSIS — E039 Hypothyroidism, unspecified: Secondary | ICD-10-CM | POA: Diagnosis not present

## 2019-02-05 DIAGNOSIS — E785 Hyperlipidemia, unspecified: Secondary | ICD-10-CM | POA: Diagnosis not present

## 2019-02-05 DIAGNOSIS — I10 Essential (primary) hypertension: Secondary | ICD-10-CM | POA: Diagnosis not present

## 2019-02-05 DIAGNOSIS — E1140 Type 2 diabetes mellitus with diabetic neuropathy, unspecified: Secondary | ICD-10-CM | POA: Diagnosis not present

## 2019-02-09 DIAGNOSIS — L570 Actinic keratosis: Secondary | ICD-10-CM | POA: Diagnosis not present

## 2019-02-09 DIAGNOSIS — K219 Gastro-esophageal reflux disease without esophagitis: Secondary | ICD-10-CM | POA: Diagnosis not present

## 2019-02-09 DIAGNOSIS — G40409 Other generalized epilepsy and epileptic syndromes, not intractable, without status epilepticus: Secondary | ICD-10-CM | POA: Diagnosis not present

## 2019-02-09 DIAGNOSIS — E1140 Type 2 diabetes mellitus with diabetic neuropathy, unspecified: Secondary | ICD-10-CM | POA: Diagnosis not present

## 2019-02-09 DIAGNOSIS — E785 Hyperlipidemia, unspecified: Secondary | ICD-10-CM | POA: Diagnosis not present

## 2019-02-09 DIAGNOSIS — E039 Hypothyroidism, unspecified: Secondary | ICD-10-CM | POA: Diagnosis not present

## 2019-02-09 DIAGNOSIS — I10 Essential (primary) hypertension: Secondary | ICD-10-CM | POA: Diagnosis not present

## 2019-02-09 DIAGNOSIS — G20 Parkinson's disease: Secondary | ICD-10-CM | POA: Diagnosis not present

## 2019-02-09 DIAGNOSIS — R69 Illness, unspecified: Secondary | ICD-10-CM | POA: Diagnosis not present

## 2019-02-09 DIAGNOSIS — M4804 Spinal stenosis, thoracic region: Secondary | ICD-10-CM | POA: Diagnosis not present

## 2019-02-11 ENCOUNTER — Ambulatory Visit: Payer: Medicare HMO | Admitting: Neurology

## 2019-02-12 DIAGNOSIS — E1140 Type 2 diabetes mellitus with diabetic neuropathy, unspecified: Secondary | ICD-10-CM | POA: Diagnosis not present

## 2019-02-12 DIAGNOSIS — I10 Essential (primary) hypertension: Secondary | ICD-10-CM | POA: Diagnosis not present

## 2019-02-12 DIAGNOSIS — G20 Parkinson's disease: Secondary | ICD-10-CM | POA: Diagnosis not present

## 2019-02-12 DIAGNOSIS — G40409 Other generalized epilepsy and epileptic syndromes, not intractable, without status epilepticus: Secondary | ICD-10-CM | POA: Diagnosis not present

## 2019-02-12 DIAGNOSIS — L570 Actinic keratosis: Secondary | ICD-10-CM | POA: Diagnosis not present

## 2019-02-12 DIAGNOSIS — K219 Gastro-esophageal reflux disease without esophagitis: Secondary | ICD-10-CM | POA: Diagnosis not present

## 2019-02-12 DIAGNOSIS — E785 Hyperlipidemia, unspecified: Secondary | ICD-10-CM | POA: Diagnosis not present

## 2019-02-12 DIAGNOSIS — R69 Illness, unspecified: Secondary | ICD-10-CM | POA: Diagnosis not present

## 2019-02-12 DIAGNOSIS — M4804 Spinal stenosis, thoracic region: Secondary | ICD-10-CM | POA: Diagnosis not present

## 2019-02-12 DIAGNOSIS — E039 Hypothyroidism, unspecified: Secondary | ICD-10-CM | POA: Diagnosis not present

## 2019-02-16 ENCOUNTER — Encounter: Payer: Self-pay | Admitting: Neurology

## 2019-02-16 ENCOUNTER — Other Ambulatory Visit: Payer: Self-pay

## 2019-02-16 ENCOUNTER — Ambulatory Visit (INDEPENDENT_AMBULATORY_CARE_PROVIDER_SITE_OTHER): Payer: Medicare HMO | Admitting: Neurology

## 2019-02-16 VITALS — BP 130/70 | HR 74 | Ht 73.0 in | Wt 156.0 lb

## 2019-02-16 DIAGNOSIS — G20 Parkinson's disease: Secondary | ICD-10-CM | POA: Diagnosis not present

## 2019-02-16 DIAGNOSIS — R569 Unspecified convulsions: Secondary | ICD-10-CM

## 2019-02-16 DIAGNOSIS — F0390 Unspecified dementia without behavioral disturbance: Secondary | ICD-10-CM

## 2019-02-16 DIAGNOSIS — R69 Illness, unspecified: Secondary | ICD-10-CM | POA: Diagnosis not present

## 2019-02-16 NOTE — Patient Instructions (Signed)
Continue taking your medications as you are  Always use your walker  Continue physical therapy exercises  Return to clinic in 6 months

## 2019-02-16 NOTE — Progress Notes (Signed)
Follow-up Visit   Date: 02/16/19   [place] MRN: [id number] DOB: May 14, 1934   Interim History: Gerald Leach is a 85 y.o. caucasian male with hypertension, hyperlipidemia, GERD, DM, CNS aneurysm s/p coiling ([facility], 2002), and SDH s/p craniotomy (0000000) complicated by seizure disorder returning to the clinic for follow-up of seizures disorder and dementia.  He is accompanied by self.  History of present illness: In April 2014, he sustained a fall and became acutely encephalopathic. He was admitted from 4/3 - 09/30/2012 for left SDH and underwent evacuation x 2 and placed on seizure prophylaxis with keppra. Due to breakthrough seizures, keppra was increased to 1000mg  BID.  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  Again in 2015, he had another seizure and Keppra was increased to 1250mg  BID. From 2015 - 2017, he continued to have breakthrough seizures and optimized on Keppra 1500mg  BID and vimpat 100mg  BID.  With this regimen, he only has breakthrough seizure with medication noncompliance.    He has had gait problems since 2013, one where he fractured T7 vertebra. He had myelogram which showed high-grade stenosis at T7 and was evaluated by Dr. Ronnald Ramp, neurosurgeon. They advised that a watchful waiting or surgery and he elected the conservative option.   In 2014, he was found to have vitamin B12 deficiency and diagnosed with peripheral neuropathy (confimed by EMG).    In early 2017, patient had lumbar puncture with high volume tap to evaluate for NPH, which did not demonstrate significant improvement in his gait.  He also had MRI brain and lumbar spine for his left leg weakness, which did not show structural lesion to explain symptoms.    UPDATE 02/16/2019:  He is here for 6 month follow-up visit.  He reports being compliant with his  medications and has not had any further seizures. He has lost 30lb since his last visit which is being evaluated by his PCP.  He has a part-time home health aide to help with household chores. Son is POA who manages finances.  He denies any new weakness, falls, or new neurological symptoms.    Medications:  Current Outpatient Medications on File Prior to Visit  Medication Sig Dispense Refill  . amantadine (SYMMETREL) 100 MG capsule Take 1 capsule (100 mg total) by mouth daily. 90 capsule 3  . carbidopa-levodopa (SINEMET IR) 25-100 MG tablet TAKE 1 TABLET 30-MINUTE PRIOR TO MEALS THREE TIMES DAILY. 90 tablet 5  . ferrous sulfate 325 (65 FE) MG tablet Take 325 mg by mouth at bedtime.    Marland Kitchen glucose blood (ONETOUCH VERIO) test strip Use once daily Dx E11.9 100 each 12  . Lacosamide (VIMPAT) 100 MG TABS Take 1 tablet (100 mg total) by mouth 2 (two) times daily. 180 tablet 3  . Lancets (ONETOUCH ULTRASOFT) lancets Use as instructed 100 each 12  . levETIRAcetam (KEPPRA) 750 MG tablet Take 2 tablets (1,500 mg total) by mouth 2 (two) times daily. 360 tablet 3  . levothyroxine (SYNTHROID) 75 MCG tablet Take 1 tablet (75 mcg total) by mouth daily. 90 tablet 3  . metFORMIN (GLUCOPHAGE) 500 MG tablet TAKE 1 TABLET (500 MG TOTAL) BY MOUTH 2 (TWO) TIMES DAILY WITH A MEAL. 180 tablet 1  . Multiple Vitamins-Minerals (CENTRUM SILVER 50+MEN PO) Take 1 tablet by mouth every morning.    . nystatin (MYCOSTATIN/NYSTOP) powder Apply topically 2 (two) times daily.  45 g 3  . omeprazole (PRILOSEC) 20 MG capsule TAKE 1 CAPSULE ON MONDAY,  WEDNESDAY, FRIDAY 39 capsule 1  . simvastatin (ZOCOR) 10 MG tablet TAKE 1 TABLET BY MOUTH EVERY DAY AT BEDTIME 90 tablet 0  . vitamin B-12 (CYANOCOBALAMIN) 1000 MCG tablet Take 2,000 mcg by mouth at bedtime.     No current facility-administered medications on file prior to visit.     Allergies: No Known Allergies   Review of Systems:  CONSTITUTIONAL: No fevers, chills, night sweats,  + 30lb weight loss.   EYES: No visual changes or eye pain ENT: No hearing changes.  No history of nose bleeds.   RESPIRATORY: No cough, wheezing and shortness of breath.   CARDIOVASCULAR: Negative for chest pain, and palpitations.   GI: Negative for abdominal discomfort, blood in stools or black stools.  No recent change in bowel habits.   GU:  No history of incontinence.   MUSCLOSKELETAL: No history of joint pain or swelling.  No myalgias.   SKIN: No lesions, rash, and itching.   ENDOCRINE: Negative for cold or heat intolerance, polydipsia or goiter.   PSYCH:  +depression or anxiety symptoms.   NEURO: As Above.   Vital Signs:  BP 130/70   Pulse 74   Ht 6\' 1"  (1.854 m)   Wt 156 lb (70.8 kg)   SpO2 98%   BMI 20.58 kg/m   General Medical Exam:   General:  Poorly groomed, thin-appearing, comfortable Eyes/ENT: see cranial nerve examination.   Neck:   No carotid bruits. Respiratory:  Clear to auscultation, good air entry bilaterally.   Cardiac:  Regular rate and rhythm, no murmur.   Ext:  No edema  Neurological Exam: MENTAL STATUS:  He appears more alert, depressed-appearing.  Poorly groomed.  He is can perform simple commands, but sometimes takes repeated commands.  He is orientated to person, place, and month.  CRANIAL NERVES: Pupils equal round and reactive to light.  Restricted upgaze bilaterally, otherwise extra-ocular eye movements intact.  Mild left facial droop.   MOTOR:  Bilateral UE and UE is 5/5 throughout except 5-/5 instrinsic hand muscles and bilateral dorsiflexion 4+/5.  Tone is normal.  COORDINATION/GAIT:  Mild bradykinesia with finger tapping, worse on the left.  Shuffling gait, unsteady, assisted with a walker.    Data: MRI brain wo contrast 10/03/2014: 1. Stable.  No acute intracranial abnormality. 2. Sequelae of right ICA occlusion with stable thrombosed appearing giant right ICA terminus aneurysm up to 3 cm. 3. 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 Sequelae of right frontal craniotomy. 4. Cerebral volume loss with suspected ex vacuo ventricle enlargement and nonspecific chronic white matter signal changes.  MRI lumbar spine wo contrast 04/08/2015: Mild central canal narrowing L3-4 without nerve root compression. 0.4 cm anterolisthesis L4 on L5 due to facet arthropathy. The central canal and foramina remain open this level. Mild scoliosis.  Lab Results  Component Value Date   TSH 11.17 (H) 01/02/2019   Lab Results  Component Value Date   HGBA1C 5.7 01/02/2019   Lab Results  Component Value Date   [id number] 11/05/2014   PROBLEM LIST: 1.  Seizure disorder 2.  Multifactorial dementia 3.  Gait disorder 4.  History of SDH in 09/2012 s/p evacuation 5.  Right clinoid calcified meningioma vs thrombosed giant ICA aneurysm 6. High grade T7 central canal stenosis with T7 vertebral fracture s/p fall (02/2012).  Previously seen Dr. Ronnald Ramp in Neurosurgery, patient elected conservative management, with residual left foot  weakness 7.  Ventriculomegaly.  High volume spinal tap did not show marked change in gait pattern so unlikely to represent NPH   IMPRESSION/PLAN: 1. Generalized seizure disorder in the setting of history of subdural hematoma 09/2012.  Last seizures on 03/05/2018 in the setting of medication compliance and alcohol use.    - Continue Keppra 1500mg  BID and vimpat 100mg  BID  - Medication compliance stressed, which he is better about   2.  Vascular parkinsonism.  No tremor, exam shows shuffling gait and blunted affect.    - Continue sinemet 1 tablet TID  - Fall precautions discussed, always use a walker.  3.  Multifactorial dementia, moderate to severe and interfering with IADLs and ADLs.  He has a caregiver to help with meals and medications.  Son is POA and helps with finances.   Continue amantidine 100mg  daily.   4.  Multifactorial gait abnormality with worsening shuffling pattern.  He has previously been  evaluated for NPH with negative work-up.  He has large fiber neuropathy and known thoracic canal stenosis.   Fall precautions discussed   5.  Unintentional weight loss with 30+lb since his last visit in February.  This is being followed by PCP.   Return to clinic in 6 months    Thank you for allowing me to participate in patient's care.  If I can answer any additional questions, I would be pleased to do so.    Sincerely,    Michah Minton K. Posey Pronto, DO

## 2019-02-19 DIAGNOSIS — L570 Actinic keratosis: Secondary | ICD-10-CM | POA: Diagnosis not present

## 2019-02-19 DIAGNOSIS — K219 Gastro-esophageal reflux disease without esophagitis: Secondary | ICD-10-CM | POA: Diagnosis not present

## 2019-02-19 DIAGNOSIS — G40409 Other generalized epilepsy and epileptic syndromes, not intractable, without status epilepticus: Secondary | ICD-10-CM | POA: Diagnosis not present

## 2019-02-19 DIAGNOSIS — E039 Hypothyroidism, unspecified: Secondary | ICD-10-CM | POA: Diagnosis not present

## 2019-02-19 DIAGNOSIS — M4804 Spinal stenosis, thoracic region: Secondary | ICD-10-CM | POA: Diagnosis not present

## 2019-02-19 DIAGNOSIS — R69 Illness, unspecified: Secondary | ICD-10-CM | POA: Diagnosis not present

## 2019-02-19 DIAGNOSIS — I10 Essential (primary) hypertension: Secondary | ICD-10-CM | POA: Diagnosis not present

## 2019-02-19 DIAGNOSIS — E785 Hyperlipidemia, unspecified: Secondary | ICD-10-CM | POA: Diagnosis not present

## 2019-02-19 DIAGNOSIS — E1140 Type 2 diabetes mellitus with diabetic neuropathy, unspecified: Secondary | ICD-10-CM | POA: Diagnosis not present

## 2019-02-19 DIAGNOSIS — G20 Parkinson's disease: Secondary | ICD-10-CM | POA: Diagnosis not present

## 2019-02-20 ENCOUNTER — Telehealth: Payer: Self-pay | Admitting: Adult Health

## 2019-02-20 NOTE — Telephone Encounter (Signed)
See note

## 2019-02-20 NOTE — Telephone Encounter (Signed)
Checked basket at the front admin space.  No paper work found.  Will check again on Tues.

## 2019-02-20 NOTE — Telephone Encounter (Signed)
Tillie Rung with [facility]  is calling in to follow up on  request for orders for a walker with a seat for the pt. She says that they have faxed over in July and again yesterday. Pt is awaiting assistance.     Please advise  CB: [phone]

## 2019-02-24 NOTE — Telephone Encounter (Signed)
Paper work received today as it was dropped off at the front desk.

## 2019-02-25 NOTE — Telephone Encounter (Signed)
Unable to get paper work for [facility] to go through by fax.  Copy sent by mail.  Original sent to scan.  Nothing further needed.

## 2019-02-27 ENCOUNTER — Other Ambulatory Visit: Payer: Self-pay | Admitting: Neurology

## 2019-03-04 ENCOUNTER — Other Ambulatory Visit: Payer: Self-pay | Admitting: Neurology

## 2019-03-05 ENCOUNTER — Telehealth: Payer: Self-pay | Admitting: Neurology

## 2019-03-05 ENCOUNTER — Other Ambulatory Visit: Payer: Self-pay | Admitting: Neurology

## 2019-03-05 ENCOUNTER — Other Ambulatory Visit: Payer: Self-pay

## 2019-03-05 MED ORDER — VIMPAT 100 MG PO TABS: 1.0000 | ORAL_TABLET | Freq: Two times a day (BID) | ORAL | 5 refills | Status: DC

## 2019-03-05 NOTE — Telephone Encounter (Signed)
Done twice already

## 2019-03-05 NOTE — Telephone Encounter (Signed)
Patient's son called regarding his medication Vimpat. [facility] [address] He is running out of the medication. Thank you

## 2019-03-05 NOTE — Telephone Encounter (Signed)
[facility]ac [facility] on [facility]. 90 day supply if possible. Patient is completely out of medication. Thanks!

## 2019-03-05 NOTE — Telephone Encounter (Signed)
The medication has been sent in

## 2019-03-06 DIAGNOSIS — E783 Hyperchylomicronemia: Secondary | ICD-10-CM | POA: Diagnosis not present

## 2019-03-06 DIAGNOSIS — R2681 Unsteadiness on feet: Secondary | ICD-10-CM | POA: Diagnosis not present

## 2019-03-06 DIAGNOSIS — G40909 Epilepsy, unspecified, not intractable, without status epilepticus: Secondary | ICD-10-CM | POA: Diagnosis not present

## 2019-03-06 DIAGNOSIS — Z9181 History of falling: Secondary | ICD-10-CM | POA: Diagnosis not present

## 2019-04-10 DIAGNOSIS — B351 Tinea unguium: Secondary | ICD-10-CM | POA: Diagnosis not present

## 2019-04-10 DIAGNOSIS — E1351 Other specified diabetes mellitus with diabetic peripheral angiopathy without gangrene: Secondary | ICD-10-CM | POA: Diagnosis not present

## 2019-04-10 DIAGNOSIS — L84 Corns and callosities: Secondary | ICD-10-CM | POA: Diagnosis not present

## 2019-04-13 ENCOUNTER — Other Ambulatory Visit: Payer: Self-pay | Admitting: Neurology

## 2019-04-27 DIAGNOSIS — R69 Illness, unspecified: Secondary | ICD-10-CM | POA: Diagnosis not present

## 2019-05-02 ENCOUNTER — Other Ambulatory Visit: Payer: Self-pay | Admitting: Adult Health

## 2019-05-02 ENCOUNTER — Other Ambulatory Visit: Payer: Self-pay | Admitting: Neurology

## 2019-05-06 NOTE — Telephone Encounter (Signed)
Sent to the pharmacy by e-scribe for 90 days. 

## 2019-05-06 NOTE — Telephone Encounter (Signed)
He is fine to go every 6 months

## 2019-07-09 ENCOUNTER — Telehealth: Payer: Self-pay | Admitting: Adult Health

## 2019-07-09 NOTE — Telephone Encounter (Signed)
Pt had a [employer] house on Sunday and they are concerned with the results PAD results normal on L side and R is significant obstruction 0.30-0.59.  Pts son would like to have a call back to discuss it.

## 2019-07-15 ENCOUNTER — Telehealth: Payer: Self-pay | Admitting: Adult Health

## 2019-07-15 DIAGNOSIS — I739 Peripheral vascular disease, unspecified: Secondary | ICD-10-CM

## 2019-07-15 NOTE — Telephone Encounter (Signed)
Call pt on all provided phone numbers no answer or vm

## 2019-07-15 NOTE — Telephone Encounter (Signed)
Pts son would like to have a call in reference to the lab results that was sent for the [facility] Call Nurse that went out to the house.  The test are in reference to Artery Disease Test.  Pts son would like to have a call back to discuss.

## 2019-07-16 NOTE — Telephone Encounter (Signed)
Spoke to pt son and he wanted Tommi Rumps to take a lok at the report due to having questions and concerns. PPW was faxed to our office will send to [facility] for advise. PPW placed in PCP folder.   Pt son was advised that I would return call when PPW was located. Called pt son back 3 times no answer. Will call back tomorrow.

## 2019-07-17 ENCOUNTER — Other Ambulatory Visit: Payer: Self-pay | Admitting: Adult Health

## 2019-07-17 ENCOUNTER — Other Ambulatory Visit: Payer: Self-pay | Admitting: Neurology

## 2019-07-17 DIAGNOSIS — R569 Unspecified convulsions: Secondary | ICD-10-CM

## 2019-07-17 NOTE — Telephone Encounter (Signed)
Spoke to patient's son [name]).  It sounds like a nurse practitioner from the insurance company came out to the house and was checking him for peripheral artery disease.  His right leg showed significant decrease in blood flow.  But was normal in the left side.  Son reports that patient does his father often drags his right foot but was thought that this was from a prior stroke.  When asked about pain in his right leg, [name] reports that he believes that his father does have intermittent pain in his right leg.  The family would like the patient to be seen by a vein and vascular doctor

## 2019-08-05 ENCOUNTER — Encounter: Payer: Medicare HMO | Admitting: Vascular Surgery

## 2019-08-05 ENCOUNTER — Encounter (HOSPITAL_COMMUNITY): Payer: Medicare HMO

## 2019-08-10 ENCOUNTER — Other Ambulatory Visit: Payer: Self-pay

## 2019-08-10 ENCOUNTER — Ambulatory Visit (HOSPITAL_COMMUNITY)
Admission: RE | Admit: 2019-08-10 | Discharge: 2019-08-10 | Disposition: A | Discharge status: Home / Self Care | Payer: Medicare Other | Source: Ambulatory Visit | Attending: Surgery | Admitting: Surgery

## 2019-08-10 DIAGNOSIS — I739 Peripheral vascular disease, unspecified: Secondary | ICD-10-CM

## 2019-08-17 ENCOUNTER — Ambulatory Visit: Payer: Medicare HMO | Admitting: Neurology

## 2019-08-26 ENCOUNTER — Encounter: Payer: Medicare HMO | Admitting: Vascular Surgery

## 2019-09-11 ENCOUNTER — Encounter: Payer: Medicare HMO | Admitting: Vascular Surgery

## 2019-09-11 ENCOUNTER — Encounter (HOSPITAL_COMMUNITY): Payer: Medicare HMO

## 2019-09-22 ENCOUNTER — Other Ambulatory Visit: Payer: Self-pay | Admitting: *Deleted

## 2019-09-22 DIAGNOSIS — I739 Peripheral vascular disease, unspecified: Secondary | ICD-10-CM

## 2019-09-23 ENCOUNTER — Ambulatory Visit (HOSPITAL_COMMUNITY)
Admission: RE | Admit: 2019-09-23 | Discharge: 2019-09-23 | Disposition: A | Discharge status: Home / Self Care | Payer: Medicare Other | Source: Ambulatory Visit | Attending: Vascular Surgery | Admitting: Vascular Surgery

## 2019-09-23 ENCOUNTER — Telehealth: Payer: Self-pay | Admitting: Adult Health

## 2019-09-23 ENCOUNTER — Encounter: Payer: Self-pay | Admitting: Vascular Surgery

## 2019-09-23 ENCOUNTER — Ambulatory Visit (INDEPENDENT_AMBULATORY_CARE_PROVIDER_SITE_OTHER): Payer: Medicare Other | Admitting: Vascular Surgery

## 2019-09-23 ENCOUNTER — Other Ambulatory Visit: Payer: Self-pay

## 2019-09-23 VITALS — BP 125/80 | HR 62 | Temp 98.1°F | Resp 20 | Ht 73.0 in | Wt 156.0 lb

## 2019-09-23 DIAGNOSIS — I739 Peripheral vascular disease, unspecified: Secondary | ICD-10-CM | POA: Diagnosis not present

## 2019-09-23 NOTE — Progress Notes (Addendum)
REASON FOR CONSULT:    To evaluate peripheral vascular disease.  The consult is requested by [name], NP  ASSESSMENT & PLAN:   EVALUATION FOR PERIPHERAL VASCULAR DISEASE: The patient has palpable pedal pulses with normal Doppler signals and normal ABIs bilaterally.  I reassured him he has no evidence of significant peripheral vascular disease.  His activity is limited somewhat by his back and hip pain related to a fall.  I do not think he needs any further vascular work-up at this time.  Certainly I had be happy to see him back at any time if any new vascular issues arise.   Deitra Mayo, MD Office: [phone]   HPI:   Gerald Leach is a pleasant 86 y.o. male, who was referred for evaluation of peripheral vascular disease.  Who was noted to have some occasional discoloration in his feet and was sent for evaluation of peripheral vascular disease.  The patient is ambulatory with a walker.  He fell recently and injured his hip and back.  He says x-rays were unremarkable but he continues to have some pain and bruising in the right hip and lower back.  I do not get any history of claudication although I think his activity is fairly limited.  He denies any history of rest pain or previous nonhealing wounds.  The only risk factor for peripheral vascular disease is diabetes.  He denies any history of hypertension, hypercholesterolemia, family history of premature cardiovascular disease, or tobacco use.  Past Medical History:  Diagnosis Date  . Brain aneurysm   . Colonic polyp 2003  . Dementia ([place])   . Diabetes mellitus without complication ([place])   . ED (erectile dysfunction)   . GERD (gastroesophageal reflux disease)   . Hyperlipidemia   . Hypertension   . Hypothyroidism   . Seizures ([place])    per family  . Seizures ([place])     Family History  Problem Relation Age of Onset  . Liver disease Brother        Died  . Seizures Neg Hx     SOCIAL HISTORY: Social History    Socioeconomic History  . Marital status: Married    Spouse name: Not on file  . Number of children: 2  . Years of education: Not on file  . Highest education level: Not on file  Occupational History  . Occupation: retired  Tobacco Use  . Smoking status: Never Smoker  . Smokeless tobacco: Never Used  Substance and Sexual Activity  . Alcohol use: Yes    Alcohol/week: 0.0 standard drinks    Comment: rum mixed in diet coke 3 a week  . Drug use: Yes    Types: Nitrous oxide  . Sexual activity: Not on file  Other Topics Concern  . Not on file  Social History Narrative   ** Merged History Encounter **       Lives with wife in a 2 story home.  Has no trouble with the stairs as long as he holds on to the banister.  Education: college.  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     Social Determinants of Health   Financial Resource Strain:   . Difficulty of Paying Living Expenses:   Food Insecurity:   . Worried About Charity fundraiser in the Last Year:   . Arboriculturist in the Last Year:   Transportation Needs:   . Film/video editor (Medical):   Marland Kitchen Lack of  Transportation (Non-Medical):   Physical Activity:   . Days of Exercise per Week:   . Minutes of Exercise per Session:   Stress:   . Feeling of Stress :   Social Connections:   . Frequency of Communication with Friends and Family:   . Frequency of Social Gatherings with Friends and Family:   . Attends Religious Services:   . Active Member of Clubs or Organizations:   . Attends Archivist Meetings:   Marland Kitchen Marital Status:   Intimate Partner Violence:   . Fear of Current or Ex-Partner:   . Emotionally Abused:   Marland Kitchen Physically Abused:   . Sexually Abused:     No Known Allergies  Current Outpatient Medications  Medication Sig Dispense Refill  . amantadine (SYMMETREL) 100 MG capsule Take 1 capsule (100 mg total) by mouth daily. 90 capsule 3  . carbidopa-levodopa (SINEMET IR) 25-100 MG  tablet TAKE 1 TABLET 30-MINUTE PRIOR TO MEALS THREE TIMES DAILY. 270 tablet 1  . ferrous sulfate 325 (65 FE) MG tablet Take 325 mg by mouth at bedtime.    Marland Kitchen glucose blood (ONETOUCH VERIO) test strip Use once daily Dx E11.9 100 each 12  . Lacosamide (VIMPAT) 100 MG TABS Take 1 tablet (100 mg total) by mouth 2 (two) times daily. 60 tablet 5  . Lancets (ONETOUCH ULTRASOFT) lancets Use as instructed 100 each 12  . levETIRAcetam (KEPPRA) 750 MG tablet TAKE 2 TABLETS (1,500 MG TOTAL) BY MOUTH 2 (TWO) TIMES DAILY. 360 tablet 3  . levothyroxine (SYNTHROID) 75 MCG tablet Take 1 tablet (75 mcg total) by mouth daily. 90 tablet 3  . metFORMIN (GLUCOPHAGE) 500 MG tablet TAKE 1 TABLET (500 MG TOTAL) BY MOUTH 2 (TWO) TIMES DAILY WITH A MEAL. 180 tablet 0  . Multiple Vitamins-Minerals (CENTRUM SILVER 50+MEN PO) Take 1 tablet by mouth every morning.    . nystatin (MYCOSTATIN/NYSTOP) powder Apply topically 2 (two) times daily. 45 g 3  . omeprazole (PRILOSEC) 20 MG capsule TAKE 1 CAPSULE ON MONDAY,  WEDNESDAY, FRIDAY 39 capsule 1  . simvastatin (ZOCOR) 10 MG tablet TAKE 1 TABLET BY MOUTH EVERY DAY AT BEDTIME 90 tablet 0  . VIMPAT 100 MG TABS TAKE 1 TABLET BY MOUTH TWICE A DAY 60 tablet 1  . vitamin B-12 (CYANOCOBALAMIN) 1000 MCG tablet Take 2,000 mcg by mouth at bedtime.     No current facility-administered medications for this visit.    REVIEW OF SYSTEMS:  [X]  denotes positive finding, [ ]  denotes negative finding Cardiac  Comments:  Chest pain or chest pressure: x   Shortness of breath upon exertion: x   Short of breath when lying flat: x   Irregular heart rhythm: x       Vascular    Pain in calf, thigh, or hip brought on by ambulation: x   Pain in feet at night that wakes you up from your sleep:  x   Blood clot in your veins:    Leg swelling:         Pulmonary    Oxygen at home:    Productive cough:     Wheezing:         Neurologic    Sudden weakness in arms or legs:     Sudden numbness in  arms or legs:     Sudden onset of difficulty speaking or slurred speech:    Temporary loss of vision in one eye:     Problems with dizziness:  Gastrointestinal    Blood in stool:     Vomited blood:         Genitourinary    Burning when urinating:     Blood in urine:        Psychiatric    Major depression:         Hematologic    Bleeding problems:    Problems with blood clotting too easily:        Skin    Rashes or ulcers:        Constitutional    Fever or chills:     PHYSICAL EXAM:   Vitals:   09/23/19 1129  BP: 125/80  Pulse: 62  Resp: 20  Temp: 98.1 F (36.7 C)  SpO2: 96%  Weight: 156 lb (70.8 kg)  Height: 6\' 1"  (1.854 m)    GENERAL: The patient is a well-nourished male, in no acute distress. The vital signs are documented above. CARDIAC: There is a regular rate and rhythm.  VASCULAR: I do not detect carotid bruits. He has palpable femoral, popliteal, dorsalis pedis, posterior tibial pulses bilaterally. He has no significant lower extremity swelling. He has some mild hyperpigmentation consistent with chronic venous insufficiency. PULMONARY: There is good air exchange bilaterally without wheezing or rales. ABDOMEN: Soft and non-tender with normal pitched bowel sounds.  MUSCULOSKELETAL: There are no major deformities or cyanosis. NEUROLOGIC: No focal weakness or paresthesias are detected. SKIN: There are no ulcers or rashes noted. PSYCHIATRIC: The patient has a normal affect.  DATA:    ARTERIAL DOPPLER STUDY: I have independently interpreted his arterial Doppler study today.  On the right side he has a triphasic dorsalis pedis and posterior tibial signal.  ABI is 100%.  Toe pressure is 137 mmHg.  On the left side he has a triphasic dorsalis pedis and posterior tibial signal.  ABI is 100%.  Toe pressures 128 mmHg.

## 2019-09-23 NOTE — Telephone Encounter (Signed)
Tood Joiner, pt's son ok per DPR, stated that Tommi Rumps wanted the pt to go to a Vein and Vascular appt due to a test that showed blood flow issues but after completing that appt they were told that he does not have any blood flow issues. [name] would like to speak to the CMA/PCP about this.   [name] can be reached at [phone]

## 2019-09-24 NOTE — Telephone Encounter (Signed)
Can we see what he wants to discuss?

## 2019-09-24 NOTE — Telephone Encounter (Signed)
Spoke to [employer].  He wanted to know why the pt was referred to vascular.  Testing done at vascular was normal.  Advised that we did receive the abnormal vascular test done in the patient's home performed by a nurse that works for [employer].  He was referred because of the abnormal reading.  [name] wanted to know why the test done in home was different than the test performed at the vascular office.  Franciso Bend that there could be many reasons but unfortunately an answer cannot be pinpointed. [name] views this as an abnormal expense. Advised [name] to contact the [employer] and inform them of the abnormal test done in home and the normal test done at vascular.  Perhaps they can help.  Nothing further needed.

## 2019-09-27 ENCOUNTER — Emergency Department (HOSPITAL_COMMUNITY)
Admission: EM | Admit: 2019-09-27 | Discharge: 2019-09-27 | Discharge status: Absent without leave | Payer: Medicare Other

## 2019-10-09 ENCOUNTER — Encounter: Payer: Self-pay | Admitting: Neurology

## 2019-10-09 ENCOUNTER — Ambulatory Visit (INDEPENDENT_AMBULATORY_CARE_PROVIDER_SITE_OTHER): Payer: Medicare Other | Admitting: Neurology

## 2019-10-09 ENCOUNTER — Other Ambulatory Visit: Payer: Self-pay

## 2019-10-09 VITALS — BP 137/80 | HR 74 | Wt 173.8 lb

## 2019-10-09 DIAGNOSIS — R569 Unspecified convulsions: Secondary | ICD-10-CM | POA: Diagnosis not present

## 2019-10-09 DIAGNOSIS — F0390 Unspecified dementia without behavioral disturbance: Secondary | ICD-10-CM

## 2019-10-09 DIAGNOSIS — G20 Parkinson's disease: Secondary | ICD-10-CM | POA: Diagnosis not present

## 2019-10-09 MED ORDER — AMANTADINE HCL 100 MG PO CAPS: 100.0000 mg | ORAL_CAPSULE | Freq: Every day | ORAL | 3 refills | Status: DC

## 2019-10-09 MED ORDER — VIMPAT 100 MG PO TABS: 1.0000 | ORAL_TABLET | Freq: Two times a day (BID) | ORAL | 5 refills | Status: DC

## 2019-10-09 MED ORDER — CARBIDOPA-LEVODOPA 25-100 MG PO TABS: ORAL_TABLET | ORAL | 3 refills | Status: DC

## 2019-10-09 NOTE — Patient Instructions (Addendum)
Consider getting a medical alert system at home  Continue your medications as you are taking  Return to clinic in 6 months

## 2019-10-09 NOTE — Progress Notes (Signed)
Follow-up Visit   Date: 10/09/19   Gerald Leach MRN: [id number] DOB: 1933-12-26   Interim History: Gerald Leach is a 86 y.o. caucasian male with hypertension, hyperlipidemia, GERD, DM, CNS aneurysm s/p coiling ([facility], 2002), and SDH s/p craniotomy (0000000) complicated by seizure disorder returning to the clinic for follow-up of seizures disorder and dementia.  He is accompanied by daughter.  History of present illness: In April 2014, he sustained a fall and became acutely encephalopathic. He was admitted from 4/3 - 09/30/2012 for left SDH and underwent evacuation x 2 and placed on seizure prophylaxis with keppra. Due to breakthrough seizures, keppra was increased to 1000mg  BID.  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  Again in 2015, he had another seizure and Keppra was increased to 1250mg  BID. From 2015 - 2017, he continued to have breakthrough seizures and optimized on Keppra 1500mg  BID and vimpat 100mg  BID.  With this regimen, he only has breakthrough seizure with medication noncompliance.    He has had gait problems since 2013, one where he fractured T7 vertebra. He had myelogram which showed high-grade stenosis at T7 and was evaluated by Dr. Ronnald Ramp, neurosurgeon. They advised that a watchful waiting or surgery and he elected the conservative option.   In 2014, he was found to have vitamin B12 deficiency and diagnosed with peripheral neuropathy (confimed by EMG).    In early 2017, patient had lumbar puncture with high volume tap to evaluate for NPH, which did not demonstrate significant improvement in his gait.  He also had MRI brain and lumbar spine for his left leg weakness, which did not show structural lesion to explain symptoms.    UPDATE 10/09/2019:  He is here for follow-up visit.  Overall, he seems to be stable.  There have  been no interval seizures, falls, or hospitalizations.  He reports finding himself on the floor one morning, but does not recall how he got there.  There was no injury, incontinence, headache, or fatigue that followed.  He does not have medical alert. He lives alone and has a caregiver come several times per week.   He is compliant with all of his medications.  He continues to have problems with shuffling gait and uses a walker when outside of the home.   Medications:  Current Outpatient Medications on File Prior to Visit  Medication Sig Dispense Refill  . amantadine (SYMMETREL) 100 MG capsule Take 1 capsule (100 mg total) by mouth daily. 90 capsule 3  . carbidopa-levodopa (SINEMET IR) 25-100 MG tablet TAKE 1 TABLET 30-MINUTE PRIOR TO MEALS THREE TIMES DAILY. 270 tablet 1  . ferrous sulfate 325 (65 FE) MG tablet Take 325 mg by mouth at bedtime.    Marland Kitchen glucose blood (ONETOUCH VERIO) test strip Use once daily Dx E11.9 100 each 12  . Lacosamide (VIMPAT) 100 MG TABS Take 1 tablet (100 mg total) by mouth 2 (two) times daily. 60 tablet 5  . Lancets (ONETOUCH ULTRASOFT) lancets Use as instructed 100 each 12  . levETIRAcetam (KEPPRA) 750 MG tablet TAKE 2 TABLETS (1,500 MG TOTAL) BY MOUTH 2 (TWO) TIMES DAILY. 360 tablet 3  . levothyroxine (SYNTHROID) 75 MCG tablet Take 1 tablet (75 mcg total) by mouth daily. 90 tablet 3  . metFORMIN (GLUCOPHAGE) 500 MG tablet TAKE 1 TABLET (500 MG TOTAL) BY MOUTH 2 (TWO) TIMES DAILY WITH A MEAL. [place]  tablet 0  . Multiple Vitamins-Minerals (CENTRUM SILVER 50+MEN PO) Take 1 tablet by mouth every morning.    . nystatin (MYCOSTATIN/NYSTOP) powder Apply topically 2 (two) times daily. 45 g 3  . omeprazole (PRILOSEC) 20 MG capsule TAKE 1 CAPSULE ON MONDAY,  WEDNESDAY, FRIDAY 39 capsule 1  . simvastatin (ZOCOR) 10 MG tablet TAKE 1 TABLET BY MOUTH EVERY DAY AT BEDTIME 90 tablet 0  . VIMPAT 100 MG TABS TAKE 1 TABLET BY MOUTH TWICE A DAY 60 tablet 1  . vitamin B-12 (CYANOCOBALAMIN) 1000  MCG tablet Take 2,000 mcg by mouth at bedtime.     No current facility-administered medications on file prior to visit.    Allergies: No Known Allergies  Vital Signs:  BP 137/80   Pulse 74   Wt 173 lb 12.8 oz (78.8 kg)   SpO2 98%   BMI 22.93 kg/m   Neurological Exam: MENTAL STATUS:  He is well-dressed, poorly groomed.  He easily engages in conversation and follows commands, although commands need to be repeated several times.  CRANIAL NERVES: Pupils equal round and reactive to light.  Restricted upgaze bilaterally, otherwise extra-ocular eye movements intact.    MOTOR:  Bilateral UE and UE is 5/5 throughout except 5-/5 instrinsic hand muscles and bilateral dorsiflexion 4+/5.  Tone is normal.  COORDINATION/GAIT:  Mild bradykinesia with finger tapping, worse on the left.  Shuffling gait, unsteady, assisted with a walker.    Data: MRI brain wo contrast 10/03/2014: 1. Stable.  No acute intracranial abnormality. 2. Sequelae of right ICA occlusion with stable thrombosed appearing giant right ICA terminus aneurysm up to 3 cm. 3. 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 Sequelae of right frontal craniotomy. 4. Cerebral volume loss with suspected ex vacuo ventricle enlargement and nonspecific chronic white matter signal changes.  MRI lumbar spine wo contrast 04/08/2015: Mild central canal narrowing L3-4 without nerve root compression. 0.4 cm anterolisthesis L4 on L5 due to facet arthropathy. The central canal and foramina remain open this level. Mild scoliosis.  PROBLEM LIST: 1.  Seizure disorder 2.  Multifactorial dementia 3.  Gait disorder 4.  History of SDH in 09/2012 s/p evacuation 5.  Right clinoid calcified meningioma vs thrombosed giant ICA aneurysm 6. High grade T7 central canal stenosis with T7 vertebral fracture s/p fall (02/2012).  Previously seen Dr. Ronnald Ramp in Neurosurgery, patient elected conservative management, with residual left foot weakness 7.   Ventriculomegaly.  High volume spinal tap did not show marked change in gait pattern so unlikely to represent NPH   IMPRESSION/PLAN: 1. Generalized seizure disorder in the setting of history of subdural hematoma 09/2012.  Last seizures on 03/05/2018 in the setting of medication compliance and alcohol use.    - Continue Keppra 1500mg  BID and vimpat 100mg  BID  2.  Vascular parkinsonism.  No tremor, exam shows shuffling gait and blunted affect.    - Continue sinemet 1 tablet TID  - Fall precautions discussed, always use a walker.  3.  Multifactorial dementia, moderate.  He continues to live alone and has caregiver who helps with household chores, son manages medications and finances.  Continue amantidine 100mg  daily Encouraged to get medical alert   4.  Multifactorial gait abnormality with worsening shuffling pattern.  He has previously been evaluated for NPH with negative work-up.  He has large fiber neuropathy and known thoracic canal stenosis.   Fall precautions discussed   Return to clinic in 6 months  Total time:  30 min  Thank you for allowing  me to participate in patient's care.  If I can answer any additional questions, I would be pleased to do so.    Sincerely,    Rudolfo Brandow K. Posey Pronto, DO

## 2019-10-13 ENCOUNTER — Telehealth: Payer: Self-pay | Admitting: Adult Health

## 2019-10-13 NOTE — Telephone Encounter (Signed)
  Deanna Hanncey from [facility] would like to know if Gerald Leach received the order sent  in December have not received an response  please call  [phone] to confirm.

## 2019-10-14 NOTE — Telephone Encounter (Signed)
No order found for December.

## 2019-10-14 NOTE — Telephone Encounter (Signed)
Received and placed in Gerald Leach's red folder for signature.  Will then fax.  Nothing further needed.

## 2019-10-14 NOTE — Telephone Encounter (Signed)
Spoke to [employer] and informed her that I do not have the order for this pt.  She will refax.  Will wait to receive.

## 2019-10-15 ENCOUNTER — Other Ambulatory Visit: Payer: Self-pay | Admitting: Adult Health

## 2019-10-16 ENCOUNTER — Ambulatory Visit: Payer: Medicare HMO | Admitting: Neurology

## 2019-10-16 NOTE — Telephone Encounter (Signed)
DENIED.  FILLED FOR 1 YEAR ON 01/06/2019.  REQUEST IS TOO EARLY.

## 2019-10-21 ENCOUNTER — Telehealth: Payer: Self-pay | Admitting: Neurology

## 2019-10-21 NOTE — Telephone Encounter (Signed)
Son  called and verified medications

## 2019-10-21 NOTE — Telephone Encounter (Signed)
Patient's son [name] called in wanting to go over the patient's medications. He is afraid his father may be taking them inaccurately.

## 2019-12-23 ENCOUNTER — Other Ambulatory Visit: Payer: Self-pay

## 2019-12-23 ENCOUNTER — Ambulatory Visit (INDEPENDENT_AMBULATORY_CARE_PROVIDER_SITE_OTHER): Payer: Medicare Other | Admitting: Adult Health

## 2019-12-23 ENCOUNTER — Encounter: Payer: Self-pay | Admitting: Adult Health

## 2019-12-23 VITALS — BP 126/84 | Temp 98.4°F | Wt 174.0 lb

## 2019-12-23 DIAGNOSIS — H6123 Impacted cerumen, bilateral: Secondary | ICD-10-CM

## 2019-12-23 NOTE — Progress Notes (Signed)
Subjective:    [name], male    DOB: 1934/01/17, 86 y.o.   MRN: [id number]  HPI 86 year old male who  has a past medical history of Brain aneurysm, Colonic polyp (2003), Dementia ([place]), Diabetes mellitus without complication ([place]), ED (erectile dysfunction), GERD (gastroesophageal reflux disease), Hyperlipidemia, Hypertension, Hypothyroidism, Seizures ([place]), and Seizures ([place]).  He presents to the office today for concern of cerumen impaction.  He is with his daughter today she reports that the patient has had worsening hearing past baseline.  She is concerned that the patient's ears are clogged up with earwax.  He denies pain or drainage from the ears   Review of Systems See HPI   Past Medical History:  Diagnosis Date  . Brain aneurysm   . Colonic polyp 2003  . Dementia ([place])   . Diabetes mellitus without complication ([place])   . ED (erectile dysfunction)   . GERD (gastroesophageal reflux disease)   . Hyperlipidemia   . Hypertension   . Hypothyroidism   . Seizures ([place])    per family  . Seizures ([place])     Social History   Socioeconomic History  . Marital status: Married    Spouse name: Not on file  . Number of children: 2  . Years of education: Not on file  . Highest education level: Not on file  Occupational History  . Occupation: retired  Tobacco Use  . Smoking status: Never Smoker  . Smokeless tobacco: Never Used  Vaping Use  . Vaping Use: Never used  Substance and Sexual Activity  . Alcohol use: Yes    Alcohol/week: 0.0 standard drinks    Comment: rum mixed in diet coke 3 a week  . Drug use: Yes    Types: Nitrous oxide  . Sexual activity: Not on file  Other Topics Concern  . Not on file  Social History Narrative   ** Merged History Encounter **       Lives with wife in a 2 story home.  Has no trouble with the stairs as long as he holds on to the banister.  Education: college.  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      Social Determinants of Health   Financial Resource Strain:   . Difficulty of Paying Living Expenses:   Food Insecurity:   . Worried About Charity fundraiser in the Last Year:   . Arboriculturist in the Last Year:   Transportation Needs:   . Film/video editor (Medical):   Marland Kitchen Lack of Transportation (Non-Medical):   Physical Activity:   . Days of Exercise per Week:   . Minutes of Exercise per Session:   Stress:   . Feeling of Stress :   Social Connections:   . Frequency of Communication with Friends and Family:   . Frequency of Social Gatherings with Friends and Family:   . Attends Religious Services:   . Active Member of Clubs or Organizations:   . Attends Archivist Meetings:   Marland Kitchen Marital Status:   Intimate Partner Violence:   . Fear of Current or Ex-Partner:   . Emotionally Abused:   Marland Kitchen Physically Abused:   . Sexually Abused:     Past Surgical History:  Procedure Laterality Date  . Aneurysmal clipping    . brain aneurysm surgery     at [facility]  . carotid artery aneurysm    . COLONOSCOPY  polyps  . CRANIOTOMY Right 09/19/2012   Procedure: CRANIOTOMY HEMATOMA EVACUATION SUBDURAL;  Surgeon: Otilio Connors, MD;  Location: [place] NEUR[facility] ORS;  Service: Neurosurgery;  Laterality: Right;  . CRANIOTOMY Right 09/22/2012   Procedure: CRANIOTOMY HEMATOMA EVACUATION SUBDURAL;  Surgeon: Otilio Connors, MD;  Location: [place] NEURO ORS;  Service: Neurosurgery;  Laterality: Right;  . Craniotomy.    . right hernia      Family History  Problem Relation Age of Onset  . Liver disease Brother        Died  . Seizures Neg Hx     No Known Allergies  Current Outpatient Medications on File Prior to Visit  Medication Sig Dispense Refill  . amantadine (SYMMETREL) 100 MG capsule Take 1 capsule (100 mg total) by mouth daily. 90 capsule 3  . carbidopa-levodopa (SINEMET IR) 25-100 MG tablet Take 1 tablet three times daily 270 tablet 3  . ferrous sulfate 325 (65 FE) MG tablet Take  325 mg by mouth at bedtime.    Marland Kitchen glucose blood (ONETOUCH VERIO) test strip Use once daily Dx E11.9 100 each 12  . Lacosamide (VIMPAT) 100 MG TABS Take 1 tablet (100 mg total) by mouth 2 (two) times daily. 60 tablet 5  . Lacosamide (VIMPAT) 100 MG TABS Take 1 tablet (100 mg total) by mouth 2 (two) times daily. 60 tablet 5  . Lancets (ONETOUCH ULTRASOFT) lancets Use as instructed 100 each 12  . levETIRAcetam (KEPPRA) 750 MG tablet TAKE 2 TABLETS (1,500 MG TOTAL) BY MOUTH 2 (TWO) TIMES DAILY. 360 tablet 3  . levothyroxine (SYNTHROID) 75 MCG tablet Take 1 tablet (75 mcg total) by mouth daily. 90 tablet 3  . metFORMIN (GLUCOPHAGE) 500 MG tablet TAKE 1 TABLET (500 MG TOTAL) BY MOUTH 2 (TWO) TIMES DAILY WITH A MEAL. 180 tablet 0  . Multiple Vitamins-Minerals (CENTRUM SILVER 50+MEN PO) Take 1 tablet by mouth every morning.    . nystatin (MYCOSTATIN/NYSTOP) powder Apply topically 2 (two) times daily. 45 g 3  . omeprazole (PRILOSEC) 20 MG capsule TAKE 1 CAPSULE ON MONDAY,  WEDNESDAY, FRIDAY 39 capsule 1  . simvastatin (ZOCOR) 10 MG tablet TAKE 1 TABLET BY MOUTH EVERY DAY AT BEDTIME 90 tablet 0  . vitamin B-12 (CYANOCOBALAMIN) 1000 MCG tablet Take 2,000 mcg by mouth at bedtime.     No current facility-administered medications on file prior to visit.    BP 126/84   Temp 98.4 F (36.9 C)   Wt 174 lb (78.9 kg)   BMI 22.96 kg/m       Objective:   Physical Exam Vitals and nursing note reviewed.  Constitutional:      Appearance: Normal appearance.  HENT:     Head: Normocephalic and atraumatic.     Right Ear: External ear normal. There is impacted cerumen.     Left Ear: External ear normal. There is impacted cerumen.  Neurological:     Mental Status: He is alert.  Psychiatric:        Mood and Affect: Mood normal.        Behavior: Behavior normal.        Thought Content: Thought content normal.       Assessment & Plan:  1. Bilateral impacted cerumen Warm water was applied and gentle ear  lavage performed as well as manual disimpaction with curette  Bilaterally.  There were no complications and following the disimpaction the tympanic membrane were visible bilaterally. Tympanic membranes are intact following the procedure.  Auditory canals  are normal.  The patient reported relief of symptoms after removal of cerumen. Patient tolerated procedure well.   [name], NP

## 2019-12-24 ENCOUNTER — Other Ambulatory Visit: Payer: Self-pay | Admitting: Adult Health

## 2020-03-21 ENCOUNTER — Other Ambulatory Visit: Payer: Self-pay | Admitting: Adult Health

## 2020-04-11 ENCOUNTER — Ambulatory Visit: Payer: Medicare Other | Admitting: Neurology

## 2020-04-29 ENCOUNTER — Other Ambulatory Visit: Payer: Self-pay | Admitting: Adult Health

## 2020-04-29 ENCOUNTER — Other Ambulatory Visit: Payer: Self-pay | Admitting: Neurology

## 2020-04-29 DIAGNOSIS — R569 Unspecified convulsions: Secondary | ICD-10-CM

## 2020-04-29 NOTE — Telephone Encounter (Signed)
Last OV 12/23/19 Last fill for Met 03/22/20  #180/0 Last fill for Levo 12/25/19#90/1

## 2020-06-06 ENCOUNTER — Ambulatory Visit: Payer: Medicare Other | Admitting: Neurology

## 2020-06-06 ENCOUNTER — Encounter: Payer: Self-pay | Admitting: Neurology

## 2020-06-06 ENCOUNTER — Other Ambulatory Visit: Payer: Self-pay

## 2020-06-06 VITALS — BP 110/82 | HR 93 | Ht 73.0 in | Wt 157.0 lb

## 2020-06-06 DIAGNOSIS — R2689 Other abnormalities of gait and mobility: Secondary | ICD-10-CM | POA: Diagnosis not present

## 2020-06-06 DIAGNOSIS — R569 Unspecified convulsions: Secondary | ICD-10-CM | POA: Diagnosis not present

## 2020-06-06 DIAGNOSIS — F0390 Unspecified dementia without behavioral disturbance: Secondary | ICD-10-CM

## 2020-06-06 NOTE — Progress Notes (Signed)
Follow-up Visit   Date: 06/06/20   Gerald Leach MRN: [id number] DOB: 1934-05-07   Interim History: Gerald Leach is a 86 y.o. caucasian male with hypertension, hyperlipidemia, GERD, DM, CNS aneurysm s/p coiling ([facility], 2002), and SDH s/p craniotomy (07/5954) complicated by seizure disorder returning to the clinic for follow-up of seizures disorder and dementia.  He is accompanied by daughter.  He is having progressive gait difficulty and suffered a fall prior to leaving for his visit today, hitting the back of the head on the coffee table. There was no headache, dizziness, loss of consciousness, or vision changes.  He denies any current pain.  His gait is very unsteady, even using a walker. Daughter is hoping to get a wheelchair.   Since his last visit, he has had no seizures and is compliant with medications.  His memory is fair.  He repeats himself often and sometimes forget his medications.  He continues to live at home alone and has assistance from his daughter and son who help with household chores, transportation, and medication management.  He used to have caregiver, but was unable to retain them.  He does not wear his hearing aides which makes effective communication even more difficult.   Medications:  Current Outpatient Medications on File Prior to Visit  Medication Sig Dispense Refill   amantadine (SYMMETREL) 100 MG capsule Take 1 capsule (100 mg total) by mouth daily. 90 capsule 3   carbidopa-levodopa (SINEMET IR) 25-100 MG tablet Take 1 tablet three times daily 270 tablet 3   ferrous sulfate 325 (65 FE) MG tablet Take 325 mg by mouth at bedtime.     glucose blood (ONETOUCH VERIO) test strip Use once daily Dx E11.9 100 each 12   Lacosamide (VIMPAT) 100 MG TABS Take 1 tablet (100 mg total) by mouth 2 (two) times daily. 60 tablet 5   Lancets (ONETOUCH ULTRASOFT) lancets Use as instructed 100 each 12   levETIRAcetam (KEPPRA) 750 MG tablet TAKE 2 TABLETS (1,500 MG  TOTAL) BY MOUTH 2 (TWO) TIMES DAILY. 360 tablet 3   levothyroxine (SYNTHROID) 75 MCG tablet TAKE 1 TABLET BY MOUTH EVERY DAY 90 tablet 1   metFORMIN (GLUCOPHAGE) 500 MG tablet TAKE 1 TABLET (500 MG TOTAL) BY MOUTH 2 (TWO) TIMES DAILY WITH A MEAL. NEED APPOINTMENT 180 tablet 0   Multiple Vitamins-Minerals (CENTRUM SILVER 50+MEN PO) Take 1 tablet by mouth every morning.     nystatin (MYCOSTATIN/NYSTOP) powder Apply topically 2 (two) times daily. 45 g 3   omeprazole (PRILOSEC) 20 MG capsule TAKE 1 CAPSULE ON MONDAY,  WEDNESDAY, FRIDAY 39 capsule 1   simvastatin (ZOCOR) 10 MG tablet TAKE 1 TABLET BY MOUTH EVERY DAY AT BEDTIME 90 tablet 0   VIMPAT 100 MG TABS TAKE 1 TABLET BY MOUTH TWICE A DAY 60 tablet 5   vitamin B-12 (CYANOCOBALAMIN) 1000 MCG tablet Take 2,000 mcg by mouth at bedtime.     No current facility-administered medications on file prior to visit.    Allergies: No Known Allergies  Vital Signs:  Pulse 93    Ht 6\' 1"  (1.854 m)    Wt 157 lb (71.2 kg)    SpO2 97%    BMI 20.71 kg/m   Neurological Exam: MENTAL STATUS:  He is well-dressed, poorly groomed.  Very hard on hearing, so does not engage in conversation.  Perseverates the question.  Year is 2021, month is December.    CRANIAL NERVES: Pupils equal round and reactive to light.  Restricted upgaze bilaterally, otherwise extra-ocular eye movements intact.    MOTOR:  Bilateral UE and UE is 5/5 throughout except 5-/5 instrinsic hand muscles and bilateral dorsiflexion 4+/5.  Tone is normal.  COORDINATION/GAIT:  Marked bradykinesia with finger tapping, worse on the left.  Shuffling gait, very unsteady, assisted with a walker.    Data: MRI brain wo contrast 10/03/2014: 1. Stable.  No acute intracranial abnormality. 2. Sequelae of right ICA occlusion with stable thrombosed appearing giant right ICA terminus aneurysm up to 3 cm. 3. 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 Sequelae of right frontal craniotomy. 4.  Cerebral volume loss with suspected ex vacuo ventricle enlargement and nonspecific chronic white matter signal changes.  MRI lumbar spine wo contrast 04/08/2015: Mild central canal narrowing L3-4 without nerve root compression. 0.4 cm anterolisthesis L4 on L5 due to facet arthropathy. The central canal and foramina remain open this level. Mild scoliosis.  PROBLEM LIST: 1.  Seizure disorder 2.  Multifactorial dementia 3.  Gait disorder 4.  History of SDH in 09/2012 s/p evacuation 5.  Right clinoid calcified meningioma vs thrombosed giant ICA aneurysm 6. High grade T7 central canal stenosis with T7 vertebral fracture s/p fall (02/2012).  Previously seen Dr. Ronnald Ramp in Neurosurgery, patient elected conservative management, with residual left foot weakness 7.  Ventriculomegaly.  High volume spinal tap did not show marked change in gait pattern so unlikely to represent NPH   IMPRESSION/PLAN: 1. Generalized seizure disorder due to subdural hematoma 09/2012.  Last seizures on 03/05/2018 in the setting of medication compliance and alcohol use.  He has been seizures.  - Continue Keppra 1500mg  BID and vimpat 100mg  BID  2.  Vascular parkinsonism.  No tremor, bradykinesia exam shows shuffling gait and blunted affect.    - Continue sinemet 1 tablet TID  - Agree with using a wheelchair  3.  Multifactorial dementia, moderate.  Discussed at length about home safety.  Patient not interested in moving into nursing facility, so I strongly encouraged to get in-home caregiver.  Information for home care provided.  Continue amantidine 100mg  daily  4.  Multifactorial gait abnormality with worsening shuffling pattern.  He has previously been evaluated for NPH with negative work-up.  He has large fiber neuropathy and known thoracic canal stenosis.   Fall precautions discussed   Return to clinic in 6 months  Total time spent reviewing records, interview, history/exam, documentation, and coordination of care on day  of encounter:  30 min     Thank you for allowing me to participate in patient's care.  If I can answer any additional questions, I would be pleased to do so.    Sincerely,    Bunyan Brier K. Posey Pronto, DO

## 2020-06-06 NOTE — Patient Instructions (Signed)
Return to clinic in 6 months.

## 2020-07-12 ENCOUNTER — Other Ambulatory Visit: Payer: Self-pay | Admitting: Neurology

## 2020-07-28 ENCOUNTER — Other Ambulatory Visit: Payer: Self-pay | Admitting: Adult Health

## 2020-07-28 NOTE — Telephone Encounter (Signed)
Denied.  Past due for cpx.

## 2020-08-11 ENCOUNTER — Encounter (HOSPITAL_COMMUNITY): Payer: Self-pay | Admitting: Emergency Medicine

## 2020-08-11 ENCOUNTER — Encounter: Payer: Self-pay | Admitting: Adult Health

## 2020-08-11 ENCOUNTER — Encounter (HOSPITAL_COMMUNITY): Payer: Self-pay

## 2020-08-11 ENCOUNTER — Ambulatory Visit (INDEPENDENT_AMBULATORY_CARE_PROVIDER_SITE_OTHER): Payer: Medicare Other | Admitting: Adult Health

## 2020-08-11 ENCOUNTER — Observation Stay (HOSPITAL_COMMUNITY)
Admission: EM | Admit: 2020-08-11 | Discharge: 2020-08-16 | Disposition: A | Discharge status: Home / Self Care | Payer: Medicare Other | Attending: Internal Medicine | Admitting: Internal Medicine

## 2020-08-11 ENCOUNTER — Emergency Department (HOSPITAL_COMMUNITY): Payer: Medicare Other

## 2020-08-11 ENCOUNTER — Other Ambulatory Visit: Payer: Self-pay

## 2020-08-11 VITALS — BP 110/70 | HR 66

## 2020-08-11 DIAGNOSIS — E039 Hypothyroidism, unspecified: Secondary | ICD-10-CM | POA: Diagnosis not present

## 2020-08-11 DIAGNOSIS — E785 Hyperlipidemia, unspecified: Secondary | ICD-10-CM | POA: Insufficient documentation

## 2020-08-11 DIAGNOSIS — Z79899 Other long term (current) drug therapy: Secondary | ICD-10-CM | POA: Diagnosis not present

## 2020-08-11 DIAGNOSIS — Z7984 Long term (current) use of oral hypoglycemic drugs: Secondary | ICD-10-CM | POA: Diagnosis not present

## 2020-08-11 DIAGNOSIS — Z87820 Personal history of traumatic brain injury: Secondary | ICD-10-CM | POA: Insufficient documentation

## 2020-08-11 DIAGNOSIS — R4189 Other symptoms and signs involving cognitive functions and awareness: Secondary | ICD-10-CM | POA: Diagnosis not present

## 2020-08-11 DIAGNOSIS — W19XXXA Unspecified fall, initial encounter: Secondary | ICD-10-CM | POA: Diagnosis not present

## 2020-08-11 DIAGNOSIS — R2681 Unsteadiness on feet: Secondary | ICD-10-CM

## 2020-08-11 DIAGNOSIS — Z79890 Hormone replacement therapy (postmenopausal): Secondary | ICD-10-CM | POA: Insufficient documentation

## 2020-08-11 DIAGNOSIS — G20 Parkinson's disease: Secondary | ICD-10-CM | POA: Diagnosis not present

## 2020-08-11 DIAGNOSIS — F0390 Unspecified dementia without behavioral disturbance: Secondary | ICD-10-CM | POA: Insufficient documentation

## 2020-08-11 DIAGNOSIS — R1310 Dysphagia, unspecified: Secondary | ICD-10-CM | POA: Insufficient documentation

## 2020-08-11 DIAGNOSIS — K219 Gastro-esophageal reflux disease without esophagitis: Secondary | ICD-10-CM | POA: Diagnosis present

## 2020-08-11 DIAGNOSIS — I10 Essential (primary) hypertension: Secondary | ICD-10-CM | POA: Diagnosis not present

## 2020-08-11 DIAGNOSIS — E119 Type 2 diabetes mellitus without complications: Secondary | ICD-10-CM | POA: Diagnosis not present

## 2020-08-11 DIAGNOSIS — F0280 Dementia in other diseases classified elsewhere without behavioral disturbance: Secondary | ICD-10-CM

## 2020-08-11 DIAGNOSIS — R531 Weakness: Secondary | ICD-10-CM | POA: Diagnosis not present

## 2020-08-11 DIAGNOSIS — R296 Repeated falls: Secondary | ICD-10-CM | POA: Insufficient documentation

## 2020-08-11 DIAGNOSIS — I6782 Cerebral ischemia: Secondary | ICD-10-CM | POA: Diagnosis not present

## 2020-08-11 DIAGNOSIS — G20A1 Parkinson'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 dyskinesia, without mention of fluctuations: Secondary | ICD-10-CM | POA: Diagnosis present

## 2020-08-11 DIAGNOSIS — E032 Hypothyroidism due to medicaments and other exogenous substances: Secondary | ICD-10-CM

## 2020-08-11 DIAGNOSIS — S32050A Wedge compression fracture of fifth lumbar vertebra, initial encounter for closed fracture: Principal | ICD-10-CM | POA: Diagnosis present

## 2020-08-11 DIAGNOSIS — Z20822 Contact with and (suspected) exposure to covid-19: Secondary | ICD-10-CM | POA: Diagnosis not present

## 2020-08-11 DIAGNOSIS — R569 Unspecified convulsions: Secondary | ICD-10-CM

## 2020-08-11 DIAGNOSIS — Y92009 Unspecified place in unspecified non-institutional (private) residence as the place of occurrence of the external cause: Secondary | ICD-10-CM | POA: Insufficient documentation

## 2020-08-11 DIAGNOSIS — S3992XA Unspecified injury of lower back, initial encounter: Secondary | ICD-10-CM | POA: Diagnosis present

## 2020-08-11 LAB — CBC WITH DIFFERENTIAL/PLATELET
Abs Immature Granulocytes: 0.04 10*3/uL (ref 0.00–0.07)
Basophils Absolute: 0 10*3/uL (ref 0.0–0.1)
Basophils Relative: 0 %
Eosinophils Absolute: 0.1 10*3/uL (ref 0.0–0.5)
Eosinophils Relative: 1 %
HCT: 48.2 % (ref 39.0–52.0)
Hemoglobin: 15.7 g/dL (ref 13.0–17.0)
Immature Granulocytes: 0 %
Lymphocytes Relative: 21 %
Lymphs Abs: 2.1 10*3/uL (ref 0.7–4.0)
MCH: 28.9 pg (ref 26.0–34.0)
MCHC: 32.6 g/dL (ref 30.0–36.0)
MCV: 88.6 fL (ref 80.0–100.0)
Monocytes Absolute: 1.2 10*3/uL — ABNORMAL HIGH (ref 0.1–1.0)
Monocytes Relative: 12 %
Neutro Abs: 6.4 10*3/uL (ref 1.7–7.7)
Neutrophils Relative %: 66 %
Platelets: 260 10*3/uL (ref 150–400)
RBC: 5.44 MIL/uL (ref 4.22–5.81)
RDW: 13.5 % (ref 11.5–15.5)
WBC: 9.8 10*3/uL (ref 4.0–10.5)
nRBC: 0 % (ref 0.0–0.2)

## 2020-08-11 LAB — CREATININE, SERUM
Creatinine, Ser: 0.87 mg/dL (ref 0.61–1.24)
GFR, Estimated: >60 mL/min (ref >60)

## 2020-08-11 LAB — COMPREHENSIVE METABOLIC PANEL
ALT: 12 U/L (ref 0–44)
AST: 24 U/L (ref 15–41)
Albumin: 4.1 g/dL (ref 3.5–5.0)
Alkaline Phosphatase: 80 U/L (ref 38–126)
Anion gap: 11 (ref 5–15)
BUN: 19 mg/dL (ref 8–23)
CO2: 26 mmol/L (ref 22–32)
Calcium: 9.4 mg/dL (ref 8.9–10.3)
Chloride: 100 mmol/L (ref 98–111)
Creatinine, Ser: 0.88 mg/dL (ref 0.61–1.24)
GFR, Estimated: >60 mL/min (ref >60)
Glucose, Bld: 91 mg/dL (ref 70–99)
Potassium: 3.7 mmol/L (ref 3.5–5.1)
Sodium: 137 mmol/L (ref 135–145)
Total Bilirubin: 0.9 mg/dL (ref 0.3–1.2)
Total Protein: 8.2 g/dL — ABNORMAL HIGH (ref 6.5–8.1)

## 2020-08-11 LAB — URINALYSIS, ROUTINE W REFLEX MICROSCOPIC
Bacteria, UA: NONE SEEN
Bilirubin Urine: NEGATIVE
Glucose, UA: NEGATIVE mg/dL
Hgb urine dipstick: NEGATIVE
Ketones, ur: 20 mg/dL — AB
Leukocytes,Ua: NEGATIVE
Nitrite: NEGATIVE
Protein, ur: 30 mg/dL — AB
Specific Gravity, Urine: 1.025 (ref 1.005–1.030)
pH: 6 (ref 5.0–8.0)

## 2020-08-11 LAB — RESP PANEL BY RT-PCR (FLU A&B, COVID) ARPGX2
Influenza A by PCR: NEGATIVE
Influenza B by PCR: NEGATIVE
SARS Coronavirus 2 by RT PCR: NEGATIVE

## 2020-08-11 LAB — CBC
HCT: 46.4 % (ref 39.0–52.0)
Hemoglobin: 15.1 g/dL (ref 13.0–17.0)
MCH: 28.8 pg (ref 26.0–34.0)
MCHC: 32.5 g/dL (ref 30.0–36.0)
MCV: 88.4 fL (ref 80.0–100.0)
Platelets: 165 10*3/uL (ref 150–400)
RBC: 5.25 MIL/uL (ref 4.22–5.81)
RDW: 13.3 % (ref 11.5–15.5)
WBC: 8.1 10*3/uL (ref 4.0–10.5)
nRBC: 0 % (ref 0.0–0.2)

## 2020-08-11 LAB — TROPONIN I (HIGH SENSITIVITY)
Troponin I (High Sensitivity): 5 ng/L (ref <18)
Troponin I (High Sensitivity): 4 ng/L (ref <18)

## 2020-08-11 LAB — GLUCOSE, CAPILLARY: Glucose-Capillary: 161 mg/dL — ABNORMAL HIGH (ref 70–99)

## 2020-08-11 MED ORDER — ACETAMINOPHEN 650 MG RE SUPP: 650.0000 mg | Freq: Four times a day (QID) | RECTAL | Status: DC | PRN

## 2020-08-11 MED ORDER — INSULIN ASPART 100 UNIT/ML ~~LOC~~ SOLN
0.0000 [IU] | Freq: Three times a day (TID) | SUBCUTANEOUS | Status: DC
  Administered 2020-08-16: 2 [IU] via SUBCUTANEOUS

## 2020-08-11 MED ORDER — ONDANSETRON HCL 4 MG/2ML IJ SOLN: 4.0000 mg | Freq: Four times a day (QID) | INTRAMUSCULAR | Status: DC | PRN

## 2020-08-11 MED ORDER — INSULIN ASPART 100 UNIT/ML ~~LOC~~ SOLN: 0.0000 [IU] | Freq: Every day | SUBCUTANEOUS | Status: DC

## 2020-08-11 MED ORDER — SODIUM CHLORIDE 0.45 % IV SOLN: INTRAVENOUS | Status: DC

## 2020-08-11 MED ORDER — ENOXAPARIN SODIUM 40 MG/0.4ML ~~LOC~~ SOLN
40.0000 mg | SUBCUTANEOUS | Status: DC
  Administered 2020-08-11 – 2020-08-15 (×4): 40 mg via SUBCUTANEOUS
  Filled 2020-08-11 (×5): qty 0.4

## 2020-08-11 MED ORDER — MORPHINE SULFATE (PF) 2 MG/ML IV SOLN: 2.0000 mg | INTRAVENOUS | Status: DC | PRN

## 2020-08-11 MED ORDER — ONDANSETRON HCL 4 MG PO TABS: 4.0000 mg | ORAL_TABLET | Freq: Four times a day (QID) | ORAL | Status: DC | PRN

## 2020-08-11 MED ORDER — ACETAMINOPHEN 325 MG PO TABS
650.0000 mg | ORAL_TABLET | Freq: Four times a day (QID) | ORAL | Status: DC | PRN
  Administered 2020-08-12 – 2020-08-16 (×4): 650 mg via ORAL
  Filled 2020-08-11 (×4): qty 2

## 2020-08-11 MED ORDER — ACETAMINOPHEN 500 MG PO TABS
1000.0000 mg | ORAL_TABLET | Freq: Once | ORAL | Status: AC
  Administered 2020-08-11: 1000 mg via ORAL
  Filled 2020-08-11: qty 2

## 2020-08-11 NOTE — Progress Notes (Signed)
Subjective:    [name], male    DOB: 02-23-1934, 87 y.o.   MRN: [id number]  HPI 87 year old male who  has a past medical history of Brain aneurysm, Colonic polyp (2003), Dementia ([place]), Diabetes mellitus without complication ([place]), ED (erectile dysfunction), GERD (gastroesophageal reflux disease), Hyperlipidemia, Hypertension, Hypothyroidism, Seizures ([place]), and Seizures ([place]).   His son brought him to the office today, supposedly had to call EMS to help get the patient into private vehicle.  His son reports that over the last 4 days he is fallen upwards of 4 times that his son knows about.  After the fall 4 days ago where he "injured his left side" EMS was called and per patient's son "they talk to [place] out of going to the emergency room".  Since that time he has fallen 3 more times, it is unknown if this is due to unsteady gait or possibly from unintentionally taking too much of some of his medications.  He was found on the floor earlier this morning after a fall.    His son has redone his pillbox to make sure that he is taking his correct medication and dosage.  Patient reports worsening pain to his left hip and leg as the week has gone on.  Patients son would like for patient to be directly admitted to the hospital or a rehab facility.   Is confused at baseline but his son reports that his cognitive impairment seems to have declined over the last 6 months    Review of Systems See HPI   Past Medical History:  Diagnosis Date  . Brain aneurysm   . Colonic polyp 2003  . Dementia ([place])   . Diabetes mellitus without complication ([place])   . ED (erectile dysfunction)   . GERD (gastroesophageal reflux disease)   . Hyperlipidemia   . Hypertension   . Hypothyroidism   . Seizures ([place])    per family  . Seizures ([place])     Social History   Socioeconomic History  . Marital status: Married    Spouse name: Not on file  . Number of children: 2  . Years of education:  Not on file  . Highest education level: Not on file  Occupational History  . Occupation: retired  Tobacco Use  . Smoking status: Never Smoker  . Smokeless tobacco: Never Used  Vaping Use  . Vaping Use: Never used  Substance and Sexual Activity  . Alcohol use: Yes    Alcohol/week: 0.0 standard drinks    Comment: rum mixed in diet coke 3 a week  . Drug use: Yes    Types: Nitrous oxide  . Sexual activity: Not on file  Other Topics Concern  . Not on file  Social History Narrative   ** Merged History Encounter **       Lives with wife in a 2 story home.  Has no trouble with the stairs as long as he holds on to the banister.  Education: college.  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     Social Determinants of Health   Financial Resource Strain: Not on file  Food Insecurity: Not on file  Transportation Needs: Not on file  Physical Activity: Not on file  Stress: Not on file  Social Connections: Not on file  Intimate Partner Violence: Not on file    Past Surgical History:  Procedure Laterality Date  . Aneurysmal clipping    . brain aneurysm  surgery     at [facility] 2002  . carotid artery aneurysm    . COLONOSCOPY     polyps  . CRANIOTOMY Right 09/19/2012   Procedure: CRANIOTOMY HEMATOMA EVACUATION SUBDURAL;  Surgeon: Otilio Connors, MD;  Location: [place] NEURO ORS;  Service: Neurosurgery;  Laterality: Right;  . CRANIOTOMY Right 09/22/2012   Procedure: CRANIOTOMY HEMATOMA EVACUATION SUBDURAL;  Surgeon: Otilio Connors, MD;  Location: [place] NEURO ORS;  Service: Neurosurgery;  Laterality: Right;  . Craniotomy.    . right hernia      Family History  Problem Relation Age of Onset  . Liver disease Brother        Died  . Seizures Neg Hx     No Known Allergies  Current Outpatient Medications on File Prior to Visit  Medication Sig Dispense Refill  . amantadine (SYMMETREL) 100 MG capsule TAKE 1 CAPSULE BY MOUTH EVERY DAY 90 capsule 2  . carbidopa-levodopa (SINEMET  IR) 25-100 MG tablet Take 1 tablet three times daily 270 tablet 3  . ferrous sulfate 325 (65 FE) MG tablet Take 325 mg by mouth at bedtime.    Marland Kitchen glucose blood (ONETOUCH VERIO) test strip Use once daily Dx E11.9 100 each 12  . Lacosamide (VIMPAT) 100 MG TABS Take 1 tablet (100 mg total) by mouth 2 (two) times daily. 60 tablet 5  . Lancets (ONETOUCH ULTRASOFT) lancets Use as instructed 100 each 12  . levETIRAcetam (KEPPRA) 750 MG tablet TAKE 2 TABLETS (1,500 MG TOTAL) BY MOUTH 2 (TWO) TIMES DAILY. 360 tablet 3  . levothyroxine (SYNTHROID) 75 MCG tablet TAKE 1 TABLET BY MOUTH EVERY DAY 90 tablet 1  . metFORMIN (GLUCOPHAGE) 500 MG tablet TAKE 1 TABLET (500 MG TOTAL) BY MOUTH 2 (TWO) TIMES DAILY WITH A MEAL. NEED APPOINTMENT 180 tablet 0  . Multiple Vitamins-Minerals (CENTRUM SILVER 50+MEN PO) Take 1 tablet by mouth every morning.    . nystatin (MYCOSTATIN/NYSTOP) powder Apply topically 2 (two) times daily. 45 g 3  . omeprazole (PRILOSEC) 20 MG capsule TAKE 1 CAPSULE ON MONDAY,  WEDNESDAY, FRIDAY 39 capsule 1  . simvastatin (ZOCOR) 10 MG tablet TAKE 1 TABLET BY MOUTH EVERY DAY AT BEDTIME 90 tablet 0  . VIMPAT 100 MG TABS TAKE 1 TABLET BY MOUTH TWICE A DAY 60 tablet 5  . vitamin B-12 (CYANOCOBALAMIN) 1000 MCG tablet Take 2,000 mcg by mouth at bedtime.     No current facility-administered medications on file prior to visit.    BP 110/70   Pulse 66   SpO2 100%       Objective:   Physical Exam Vitals and nursing note reviewed.  Constitutional:      Appearance: Normal appearance.  Musculoskeletal:        General: Tenderness (Right hip and right leg.) present. No deformity.  Skin:    General: Skin is warm and dry.  Neurological:     General: No focal deficit present.     Mental Status: He is alert. Mental status is at baseline.  Psychiatric:        Mood and Affect: Mood normal.        Behavior: Behavior normal.        Thought Content: Thought content normal.        Judgment: Judgment  normal.       Assessment & Plan:  I Do not think the patient is safe to go home due to recurrent falls and the inability to ambulate safely  Unfortunately I am  not able to directly admit to a rehab facility or to the hospital.  Patient needs work-up including imaging and we are unable to get him onto xray table at this office to properly due xays. Son was advised to take him to [facility] emergency room for further evaluation.  He will go via private vehicle.  [name], NP

## 2020-08-11 NOTE — ED Notes (Signed)
Pt HR 40s-50s. EKG performed and Dr. Maryan Rued notified. MD stated pt HR has been brady since arrival. Pt is not symptomatic. Denies dizziness and fatigue

## 2020-08-11 NOTE — ED Notes (Signed)
Dr. Plunkett at bedside.  

## 2020-08-11 NOTE — Progress Notes (Signed)
Orthopedic Tech Progress Note Patient Details:  Gerald Leach 05-16-1934 [id number] LSO Brace has been ordered  [name], male   DOB: 10/24/1933, 87 y.o.   MRN: [id number]    Gerald Leach 08/11/2020, 10:17 PM

## 2020-08-11 NOTE — ED Triage Notes (Signed)
Per son, patient fell a couple of days ago-unable to bear weight-complaining of back pain and right hip pain-states he lives alone-

## 2020-08-11 NOTE — H&P (Signed)
History and Physical   Gerald Leach URK:[id number] DOB: [birth year] DOA: 08/11/2020  Referring MD/NP/PA: Dr. Maryan Rued  PCP: [name], NP   Patient coming from: Home  Chief Complaint: Fall  HPI: Gerald Leach is a 87 y.o. male with medical history significant of dementia, diabetes, hypertension, hyperlipidemia, brain aneurysm with previous bleeds status post 2 surgeries in the past, Parkinson's disease, seizure disorder, gait abnormalities with fall with brought in by family today due to worsening falls at home.  Patient has been more progressively weak.  He has had at least 4 falls in the last 3 days.  He is unable to stay alone.  Most of the time cannot get up and can stay down for a while until a family member arrives.  Patient is unable to take care of himself and they are worried about hypoxia himself.  Patient is having low back pain with every movement.  Evaluation in the ER showed L5 compression fracture.  Patient is not on any blood thinners.  There is no report of him hitting his head.  However he still is at risk at home.  He has been eating and drinking but declining.  Discussion with neurosurgery regarding his compression fracture.  Recommendation is supportive care.  Patient is therefore being admitted to the hospital for further evaluation and treatment.  ED Course: Temperature 97.5 blood pressure 150/84 pulse 106 respiratory 25 and oxygen sats initially 75% currently 98% on room air.  CBC and chemistry all appear to be within normal.  Head CT without contrast showed no significantly new findings.  Chest x-ray shows no significant findings.  Lumbar spine x-ray showed anterior wedge compression deformity of the L5 vertebral body.  There is about 50% of loss of height.  Also chronic appearing superior endplate L2-L3 and L4 fractures.  Patient been admitted pain control as well as recurrent fall  Review of Systems: As per HPI otherwise 10 point review of systems negative.    Past  Medical History:  Diagnosis Date  . Brain aneurysm   . Colonic polyp 2003  . Dementia ([place])   . Diabetes mellitus without complication ([place])   . ED (erectile dysfunction)   . GERD (gastroesophageal reflux disease)   . Hyperlipidemia   . Hypertension   . Hypothyroidism   . Seizures ([place])    per family  . Seizures ([place])     Past Surgical History:  Procedure Laterality Date  . Aneurysmal clipping    . brain aneurysm surgery     at [facility]  . carotid artery aneurysm    . COLONOSCOPY     polyps  . CRANIOTOMY Right 09/19/2012   Procedure: CRANIOTOMY HEMATOMA EVACUATION SUBDURAL;  Surgeon: Otilio Connors, MD;  Location: [place] [facility];  Service: Neurosurgery;  Laterality: Right;  . CRANIOTOMY Right 09/22/2012   Procedure: CRANIOTOMY HEMATOMA EVACUATION SUBDURAL;  Surgeon: Otilio Connors, MD;  Location: [place] [facility];  Service: Neurosurgery;  Laterality: Right;  . Craniotomy.    . right hernia       reports that he has never smoked. He has never used smokeless tobacco. He reports current alcohol use. He reports current drug use. Drug: Nitrous oxide.  No Known Allergies  Family History  Problem Relation Age of Onset  . Liver disease Brother        Died  . Seizures Neg Hx      Prior to Admission medications   Medication Sig Start Date End Date Taking? Authorizing Provider  amantadine (SYMMETREL) 100 MG capsule TAKE 1 CAPSULE BY MOUTH EVERY DAY Patient taking differently: Take 100 mg by mouth daily. 07/12/20  Yes Patel, Donika K, DO  carbidopa-levodopa (SINEMET IR) 25-100 MG tablet Take 1 tablet three times daily Patient taking differently: Take 1 tablet by mouth 2 (two) times daily. 10/09/19  Yes Patel, Donika K, DO  Lacosamide (VIMPAT) 100 MG TABS Take 1 tablet (100 mg total) by mouth 2 (two) times daily. 03/05/19  Yes Patel, Donika K, DO  levETIRAcetam (KEPPRA) 750 MG tablet TAKE 2 TABLETS (1,500 MG TOTAL) BY MOUTH 2 (TWO) TIMES DAILY. 04/29/20  Yes Patel, Donika K, DO   levothyroxine (SYNTHROID) 75 MCG tablet TAKE 1 TABLET BY MOUTH EVERY DAY Patient taking differently: Take 75 mcg by mouth daily before breakfast. 04/29/20  Yes Nafziger, Tommi Rumps, NP  metFORMIN (GLUCOPHAGE) 500 MG tablet TAKE 1 TABLET (500 MG TOTAL) BY MOUTH 2 (TWO) TIMES DAILY WITH A MEAL. NEED APPOINTMENT Patient taking differently: Take 500 mg by mouth 2 (two) times daily with a meal. 04/29/20  Yes Nafziger, Tommi Rumps, NP  VIMPAT 100 MG TABS TAKE 1 TABLET BY MOUTH TWICE A DAY Patient taking differently: Take 100 mg by mouth 2 (two) times daily. 04/29/20  Yes Patel, Donika K, DO  vitamin B-12 (CYANOCOBALAMIN) 1000 MCG tablet Take 2,000 mcg by mouth at bedtime.   Yes [provider]  glucose blood (ONETOUCH VERIO) test strip Use once daily Dx E11.9 11/19/16   [name], NP  Lancets [facility] ULTRASOFT) lancets Use as instructed 09/16/15   Nafziger, Tommi Rumps, NP  nystatin (MYCOSTATIN/NYSTOP) powder Apply topically 2 (two) times daily. Patient not taking: No sig reported 07/25/17   [name], NP  omeprazole (PRILOSEC) 20 MG capsule TAKE 1 CAPSULE ON MONDAY,  [place], [place] Patient not taking: No sig reported 03/04/18   [name], NP  simvastatin (ZOCOR) 10 MG tablet TAKE 1 TABLET BY MOUTH EVERY DAY AT BEDTIME Patient not taking: Reported on 08/11/2020 07/30/18   [name], NP    Physical Exam: Vitals:   08/11/20 2025 08/11/20 2030 08/11/20 2045 08/11/20 2100  BP: (!) 156/92 (!) 151/100  (!) 158/79  Pulse: (!) 50 (!) 50  62  Resp: (!) 23 15 19 13   Temp:      TempSrc:      SpO2: 100% 99% 99% 99%  Weight:      Height:          Constitutional: Frail, weak, confused Vitals:   08/11/20 2025 08/11/20 2030 08/11/20 2045 08/11/20 2100  BP: (!) 156/92 (!) 151/100  (!) 158/79  Pulse: (!) 50 (!) 50  62  Resp: (!) 23 15 19 13   Temp:      TempSrc:      SpO2: 100% 99% 99% 99%  Weight:      Height:       Eyes: PERRL, lids and conjunctivae normal ENMT: Mucous membranes are  dry. Posterior pharynx clear of any exudate or lesions.Normal dentition.  Neck: normal, supple, no masses, no thyromegaly Respiratory: clear to auscultation bilaterally, no wheezing, no crackles. Normal respiratory effort. No accessory muscle use.  Cardiovascular: Bradycardic, no murmurs / rubs / gallops. No extremity edema. 2+ pedal pulses. No carotid bruits.  Abdomen: no tenderness, no masses palpated. No hepatosplenomegaly. Bowel sounds positive.  Musculoskeletal: no clubbing / cyanosis. No joint deformity upper and lower extremities.  Positive SLR with pain after any movement no contractures. Normal muscle tone.  Skin: no rashes, lesions, ulcers. No induration Neurologic:  CN 2-12 grossly intact. Sensation intact, DTR normal. Strength 5/5 in all 4.  Psychiatric: Confused, communicating and awake.     Labs on Admission: I have personally reviewed following labs and imaging studies  CBC: Recent Labs  Lab 08/11/20 1644  WBC 9.8  NEUTROABS 6.4  HGB 15.7  HCT 48.2  MCV 88.6  PLT 604   Basic Metabolic Panel: Recent Labs  Lab 08/11/20 1644  NA 137  K 3.7  CL 100  CO2 26  GLUCOSE 91  BUN 19  CREATININE 0.88  CALCIUM 9.4   GFR: Estimated Creatinine Clearance: 66.1 mL/min (by C-G formula based on SCr of 0.88 mg/dL). Liver Function Tests: Recent Labs  Lab 08/11/20 1644  AST 24  ALT 12  ALKPHOS 80  BILITOT 0.9  PROT 8.2*  ALBUMIN 4.1   No results for input(s): LIPASE, AMYLASE in the last 168 hours. No results for input(s): AMMONIA in the last 168 hours. Coagulation Profile: No results for input(s): INR, PROTIME in the last 168 hours. Cardiac Enzymes: No results for input(s): CKTOTAL, CKMB, CKMBINDEX, TROPONINI in the last 168 hours. BNP (last 3 results) No results for input(s): PROBNP in the last 8760 hours. HbA1C: No results for input(s): HGBA1C in the last 72 hours. CBG: No results for input(s): GLUCAP in the last 168 hours. Lipid Profile: No results for  input(s): CHOL, HDL, LDLCALC, TRIG, CHOLHDL, LDLDIRECT in the last 72 hours. Thyroid Function Tests: No results for input(s): TSH, T4TOTAL, FREET4, T3FREE, THYROIDAB in the last 72 hours. Anemia Panel: No results for input(s): VITAMINB12, FOLATE, FERRITIN, TIBC, IRON, RETICCTPCT in the last 72 hours. Urine analysis:    Component Value Date/Time   COLORURINE YELLOW 08/11/2020 1721   APPEARANCEUR CLEAR 08/11/2020 1721   LABSPEC 1.025 08/11/2020 1721   PHURINE 6.0 08/11/2020 1721   GLUCOSEU NEGATIVE 08/11/2020 1721   HGBUR NEGATIVE 08/11/2020 1721   HGBUR trace-intact 02/22/2010 0850   BILIRUBINUR NEGATIVE 08/11/2020 1721   BILIRUBINUR 1+ 03/11/2018 1808   KETONESUR 20 (A) 08/11/2020 1721   PROTEINUR 30 (A) 08/11/2020 1721   UROBILINOGEN 4.0 (A) 03/11/2018 1808   UROBILINOGEN 1.0 10/02/2014 2242   NITRITE NEGATIVE 08/11/2020 1721   LEUKOCYTESUR NEGATIVE 08/11/2020 1721   Sepsis Labs: @LABRCNTIP (procalcitonin:4,lacticidven:4) )No results found for this or any previous visit (from the past 240 hour(s)).   Radiological Exams on Admission: DG Chest 2 View  Result Date: 08/11/2020 CLINICAL DATA:  [place] several days ago, back pain EXAM: CHEST - 2 VIEW COMPARISON:  03/07/2018 FINDINGS: Frontal and lateral views of the chest demonstrate an unremarkable cardiac silhouette. No airspace disease, effusion, or pneumothorax. Chronic midthoracic wedge compression deformity again noted. Invagination of the superior endplates in the upper and mid lumbar spine identified, age indeterminate. Please refer to separately reported lumbar spine series. IMPRESSION: 1. No acute intrathoracic process. 2. Chronic midthoracic wedge compression deformity. 3. Age indeterminate compression fractures lumbar spine, please refer to dedicated lumbar spine evaluation performed concurrently. Electronically Signed   By: Randa Ngo M.D.   On: 08/11/2020 18:49   DG Lumbar Spine Complete  Result Date: 08/11/2020 CLINICAL  DATA:  [place] several days ago, inability to bear weight, right hip pain EXAM: LUMBAR SPINE - COMPLETE 4+ VIEW COMPARISON:  03/11/2018 FINDINGS: Frontal, bilateral oblique, lateral views of the lumbar spine are obtained. There are 5 non-rib-bearing lumbar type vertebral bodies, with hemi sacralization of the L5 vertebral body on the right. There is anterior wedge compression deformity of the L5 vertebral body, with approximately  50% loss of height. No retropulsion. This is age indeterminate, but new since 2019. Mild invagination of the superior endplates of L2, L3, and L4 age indeterminate but likely chronic. Prominent left convex scoliosis centered at L3. Severe facet hypertrophy throughout the entire lumbar spine, greatest at the lumbosacral junction. Sacroiliac joints are unremarkable. IMPRESSION: 1. Anterior wedge compression deformity of the L5 vertebral body, with approximately 50% loss of height. This is new since 2019, but otherwise age indeterminate. 2. Chronic appearing invagination of the superior endplates of L2, L3, and L4. Electronically Signed   By: Randa Ngo M.D.   On: 08/11/2020 18:55   CT Head Wo Contrast  Result Date: 08/11/2020 CLINICAL DATA:  [place] several days ago EXAM: CT HEAD WITHOUT CONTRAST TECHNIQUE: Contiguous axial images were obtained from the base of the skull through the vertex without intravenous contrast. COMPARISON:  03/05/2018 FINDINGS: Brain: Confluent hypodensities throughout the periventricular white matter are compatible with chronic small vessel ischemic changes. No signs of acute infarct or hemorrhage. No acute extra-axial fluid collections. No mass effect. Vascular: Calcified supraclinoid right ICA aneurysm measures 2.5 x 1.6 cm in transverse dimension unchanged since prior study. No hyperdense vessel. Skull: Postsurgical changes from previous right frontotemporal craniotomy. No acute bony abnormalities. Sinuses/Orbits: No acute finding. Other: None. IMPRESSION: 1.  Stable chronic small-vessel ischemic changes. No acute intracranial process. 2. Stable calcified supraclinoid right ICA aneurysm. Electronically Signed   By: Randa Ngo M.D.   On: 08/11/2020 18:45   DG Hip Unilat W or Wo Pelvis 2-3 Views Right  Result Date: 08/11/2020 CLINICAL DATA:  [place] several days ago, inability to bear weight, right hip pain EXAM: DG HIP (WITH OR WITHOUT PELVIS) 2-3V RIGHT COMPARISON:  08/20/2018 FINDINGS: Frontal view of the pelvis as well as frontal and frogleg lateral views of the right hip are obtained. No acute fracture, subluxation, or dislocation. Stable mild bilateral hip osteoarthritis. Sacroiliac joints are unremarkable. IMPRESSION: 1. No acute displaced fracture. 2. Stable osteoarthritis. Electronically Signed   By: Randa Ngo M.D.   On: 08/11/2020 18:51      Assessment/Plan Principal Problem:   Fall Active Problems:   GERD   Diabetes mellitus type 2, controlled ([place])   Hypothyroidism   Parkinson disease ([place])   Gait instability   Seizures ([facility])   Closed compression fracture of L5 lumbar vertebra, initial encounter ([place])     #1 recurrent falls: Patient will be admitted for PT and OT evaluation.  Already discussed this case with neurosurgery by ER.  No surgical indication at this point.  Patient will benefit from back brace as well as continue PT OT since he has been functional.  #2 gait abnormalities: Most likely multifactorial.  History of Parkinson's disease as well as age.  Again PT and OT consultation.  Patient may end up benefiting from skilled facility and rehab.  #3 diabetes: Sliding scale insulin.  #4 Parkinson's disease: Confirm onresume home regimen  #5 hypothyroidism: Resume home regimen.  #6 multiple compression fractures: Pain control as above with PT OT  #7 GERD: Continue PPIs  #5 hypothyroidism:   DVT prophylaxis: Lovenox Code Status: Full code Family Communication: Son Disposition Plan: To be determined but most  likely skilled Consults called: None Admission status: Inpatient  Severity of Illness: The appropriate patient status for this patient is INPATIENT. Inpatient status is judged to be reasonable and necessary in order to provide the required intensity of service to ensure the patient's safety. [name] presenting symptoms, physical exam findings,  and initial radiographic and laboratory data in the context of their chronic comorbidities is felt to place them at high risk for further clinical deterioration. Furthermore, it is not anticipated that the patient will be medically stable for discharge from the hospital within 2 midnights of admission. The following factors support the patient status of inpatient.   " [name] presenting symptoms include recurrent fall. " The worrisome physical exam findings include confusion and pain. " The initial radiographic and laboratory data are worrisome because of L5 compression fractures. " The chronic co-morbidities include Parkinson's disease.   * I certify that at the point of admission it is my clinical judgment that the patient will require inpatient hospital care spanning beyond 2 midnights from the point of admission due to high intensity of service, high risk for further deterioration and high frequency of surveillance required.Barbette Merino MD [facility] Pager [phone]  If 7PM-7AM, please contact night-coverage [email or web address] Password [facility]  08/11/2020, 9:42 PM

## 2020-08-11 NOTE — ED Provider Notes (Signed)
[place] DEPT Provider Note   CSN: [id number] Arrival date & time: 08/11/20  1543     History Chief Complaint  Patient presents with  . Fall    Gerald Leach is a 87 y.o. male.  Patient is an 87 year old male with a history of dementia, diabetes, hypertension, hyperlipidemia, seizure disorder, prior brain aneurysm with bleed status post surgery x2 with a history of Parkinson's disease and frequent falls who is presenting today with family due to recurrent falls, generalized weakness and inability to care for himself at home.  Since Monday patient has had 4 falls and every time he is left for more than 5 minutes when a family member returns he is on the floor.  Patient is unable to hold up his own weight at all and just sinks to the floor.  He is not exactly sure what caused his fall on Monday but he was in the kitchen and fell on his right hip.  He does complain of pain in his lower back and pain in his right hip especially when he attempts to stand.  He has hit his head multiple times during falls but does not think he hit his head in the last 4 days.  He denies a headache, neck pain, chest pain, shortness of breath, abdominal pain.  Family reports that he has been eating and drinking.  However they are not sure when his last bowel movement was but he seems to be urinating appropriately.  He takes a lot of medications but none of them have changed recently.  He does not take any anticoagulation.  The history is provided by the patient and a relative.  Fall This is a recurrent problem.       Past Medical History:  Diagnosis Date  . Brain aneurysm   . Colonic polyp 2003  . Dementia ([place])   . Diabetes mellitus without complication ([place])   . ED (erectile dysfunction)   . GERD (gastroesophageal reflux disease)   . Hyperlipidemia   . Hypertension   . Hypothyroidism   . Seizures ([place])    per family  . Seizures [facility])     Patient Active Problem List    Diagnosis Date Noted  . Seizures ([place]) 03/05/2018  . Leukocytosis 03/05/2018  . Diabetic foot ulcers ([place]) 04/23/2017  . Parkinson disease ([place])   . Gait instability   . Diabetes mellitus type 2, controlled ([place]) 08/05/2015  . Hypothyroidism 08/05/2015  . Fall at home 10/02/2014  . CNS aneurysm s/p coiling ([facility], 2002) 10/02/2014  . High grade T7 central canal stenosis with T7 vertebral fracture s/p fall (02/2012) 10/02/2014  . 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 10/02/2014  . Syncope 02/05/2014  . Drug-induced delirium(292.81) 05/16/2013  . Seizure disorder ([place]) 04/21/2013  . Seizure disorder, grand mal ([place]) 03/24/2013  . Cervical compression fracture---C7 10/29/2012  . Dysphagia 09/21/2012  . Subdural hematoma ([place]) 09/20/2012  . SOLAR KERATOSIS 02/17/2009  . CARCINOMA, SKIN, SQUAMOUS CELL, FACE 04/19/2008  . Hyperlipidemia, group D 01/27/2008  . COLONIC POLYPS 11/15/2006  . GERD 11/15/2006    Past Surgical History:  Procedure Laterality Date  . Aneurysmal clipping    . brain aneurysm surgery     at [facility]  . carotid artery aneurysm    . COLONOSCOPY     polyps  . CRANIOTOMY Right 09/19/2012   Procedure: CRANIOTOMY HEMATOMA EVACUATION SUBDURAL;  Surgeon: Otilio Connors, MD;  Location: [place];  Service: Neurosurgery;  Laterality: Right;  . CRANIOTOMY Right 09/22/2012   Procedure: CRANIOTOMY HEMATOMA EVACUATION SUBDURAL;  Surgeon: Otilio Connors, MD;  Location: [place] [facility];  Service: Neurosurgery;  Laterality: Right;  . Craniotomy.    . right hernia         Family History  Problem Relation Age of Onset  . Liver disease Brother        Died  . Seizures Neg Hx     Social History   Tobacco Use  . Smoking status: Never Smoker  . Smokeless tobacco: Never Used  Vaping Use  . Vaping Use: Never used  Substance Use Topics  . Alcohol use: Yes    Alcohol/week: 0.0 standard drinks    Comment: rum mixed  in diet coke 3 a week  . Drug use: Yes    Types: Nitrous oxide    Home Medications Prior to Admission medications   Medication Sig Start Date End Date Taking? Authorizing Provider  amantadine (SYMMETREL) 100 MG capsule TAKE 1 CAPSULE BY MOUTH EVERY DAY 07/12/20   Narda Amber K, DO  carbidopa-levodopa (SINEMET IR) 25-100 MG tablet Take 1 tablet three times daily 10/09/19   Narda Amber K, DO  ferrous sulfate 325 (65 FE) MG tablet Take 325 mg by mouth at bedtime.    [provider]  glucose blood (ONETOUCH VERIO) test strip Use once daily Dx E11.9 11/19/16   [name], NP  Lacosamide (VIMPAT) 100 MG TABS Take 1 tablet (100 mg total) by mouth 2 (two) times daily. 03/05/19   Narda Amber K, DO  Lancets (ONETOUCH ULTRASOFT) lancets Use as instructed 09/16/15   Nafziger, Tommi Rumps, NP  levETIRAcetam (KEPPRA) 750 MG tablet TAKE 2 TABLETS (1,500 MG TOTAL) BY MOUTH 2 (TWO) TIMES DAILY. 04/29/20   Narda Amber K, DO  levothyroxine (SYNTHROID) 75 MCG tablet TAKE 1 TABLET BY MOUTH EVERY DAY 04/29/20   Nafziger, Tommi Rumps, NP  metFORMIN (GLUCOPHAGE) 500 MG tablet TAKE 1 TABLET (500 MG TOTAL) BY MOUTH 2 (TWO) TIMES DAILY WITH A MEAL. NEED APPOINTMENT 04/29/20   [name], NP  Multiple Vitamins-Minerals (CENTRUM SILVER 50+MEN PO) Take 1 tablet by mouth every morning.    [provider]  nystatin (MYCOSTATIN/NYSTOP) powder Apply topically 2 (two) times daily. 07/25/17   Nafziger, Tommi Rumps, NP  omeprazole (PRILOSEC) 20 MG capsule TAKE 1 CAPSULE ON MONDAY,  WEDNESDAY, FRIDAY 03/04/18   Nafziger, Tommi Rumps, NP  simvastatin (ZOCOR) 10 MG tablet TAKE 1 TABLET BY MOUTH EVERY DAY AT BEDTIME 07/30/18   Nafziger, Tommi Rumps, NP  VIMPAT 100 MG TABS TAKE 1 TABLET BY MOUTH TWICE A DAY 04/29/20   Patel, Donika K, DO  vitamin B-12 (CYANOCOBALAMIN) 1000 MCG tablet Take 2,000 mcg by mouth at bedtime.    [provider]    Allergies    Patient has no known allergies.  Review of Systems   Review of Systems  All  other systems reviewed and are negative.   Physical Exam Updated Vital Signs BP (!) 150/84 (BP Location: Left Arm)   Pulse 69   Temp (!) 97.5 F (36.4 C) (Oral)   Resp 18   Ht 5\' 11"  (1.803 m)   Wt 74.8 kg   SpO2 97%   BMI 23.01 kg/m   Physical Exam Vitals and nursing note reviewed.  Constitutional:      General: He is not in acute distress.    Appearance: He is well-developed and well-nourished.  HENT:     Head: Normocephalic and atraumatic.  Nose: Nose normal.     Mouth/Throat:     Mouth: Oropharynx is clear and moist. Mucous membranes are moist.  Eyes:     Extraocular Movements: EOM normal.     Conjunctiva/sclera: Conjunctivae normal.     Pupils: Pupils are equal, round, and reactive to light.  Cardiovascular:     Rate and Rhythm: Normal rate and regular rhythm.     Pulses: Intact distal pulses.     Heart sounds: No murmur heard.   Pulmonary:     Effort: Pulmonary effort is normal. No respiratory distress.     Breath sounds: Normal breath sounds. No wheezing or rales.  Abdominal:     General: There is no distension.     Palpations: Abdomen is soft.     Tenderness: There is no abdominal tenderness. There is no guarding or rebound.  Musculoskeletal:        General: Tenderness present. No edema.     Cervical back: Normal range of motion and neck supple.     Lumbar back: Tenderness and bony tenderness present. Decreased range of motion.       Back:     Right hip: Tenderness and bony tenderness present. Normal range of motion.     Left hip: Normal.     Right lower leg: No edema.     Left lower leg: No edema.     Comments: Bruises over the arms and legs in various stages of healing.  Tenderness of the right hip with axial loading and range of motion.  No deformity noted.  Bilateral knees and ankles normal  Skin:    General: Skin is warm and dry.     Findings: No erythema or rash.  Neurological:     Mental Status: He is alert. Mental status is at baseline.      Sensory: No sensory deficit.     Comments: Able to lift legs off the bed against gravity but generalized weakness throughout 4 out of 5  Psychiatric:        Mood and Affect: Mood and affect normal.     Comments: Calm and cooperative.  Appears to answer questions appropriately.     ED Results / Procedures / Treatments   Labs (all labs ordered are listed, but only abnormal results are displayed) Labs Reviewed  CBC WITH DIFFERENTIAL/PLATELET - Abnormal; Notable for the following components:      Result Value   Monocytes Absolute 1.2 (*)    All other components within normal limits  COMPREHENSIVE METABOLIC PANEL - Abnormal; Notable for the following components:   Total Protein 8.2 (*)    All other components within normal limits  URINALYSIS, ROUTINE W REFLEX MICROSCOPIC - Abnormal; Notable for the following components:   Ketones, ur 20 (*)    Protein, ur 30 (*)    All other components within normal limits  TROPONIN I (HIGH SENSITIVITY)  TROPONIN I (HIGH SENSITIVITY)    EKG EKG Interpretation  Date/Time:  Thursday August 11 2020 16:31:31 EST Ventricular Rate:  58 PR Interval:    QRS Duration: 95 QT Interval:  449 QTC Calculation: 406 R Axis:   -27 Text Interpretation: Atrial fibrillation Borderline left axis deviation Borderline low voltage, extremity leads Consider anterior infarct Minimal ST depression, inferior leads No significant change since last tracing Baseline wander Confirmed by Blanchie Dessert ([id number]) on 08/11/2020 4:34:48 PM   Radiology DG Chest 2 View  Result Date: 08/11/2020 CLINICAL DATA:  [place] several days ago, back pain EXAM: CHEST -  2 VIEW COMPARISON:  03/07/2018 FINDINGS: Frontal and lateral views of the chest demonstrate an unremarkable cardiac silhouette. No airspace disease, effusion, or pneumothorax. Chronic midthoracic wedge compression deformity again noted. Invagination of the superior endplates in the upper and mid lumbar spine identified, age  indeterminate. Please refer to separately reported lumbar spine series. IMPRESSION: 1. No acute intrathoracic process. 2. Chronic midthoracic wedge compression deformity. 3. Age indeterminate compression fractures lumbar spine, please refer to dedicated lumbar spine evaluation performed concurrently. Electronically Signed   By: Randa Ngo M.D.   On: 08/11/2020 18:49   DG Lumbar Spine Complete  Result Date: 08/11/2020 CLINICAL DATA:  [place] several days ago, inability to bear weight, right hip pain EXAM: LUMBAR SPINE - COMPLETE 4+ VIEW COMPARISON:  03/11/2018 FINDINGS: Frontal, bilateral oblique, lateral views of the lumbar spine are obtained. There are 5 non-rib-bearing lumbar type vertebral bodies, with hemi sacralization of the L5 vertebral body on the right. There is anterior wedge compression deformity of the L5 vertebral body, with approximately 50% loss of height. No retropulsion. This is age indeterminate, but new since 2019. Mild invagination of the superior endplates of L2, L3, and L4 age indeterminate but likely chronic. Prominent left convex scoliosis centered at L3. Severe facet hypertrophy throughout the entire lumbar spine, greatest at the lumbosacral junction. Sacroiliac joints are unremarkable. IMPRESSION: 1. Anterior wedge compression deformity of the L5 vertebral body, with approximately 50% loss of height. This is new since 2019, but otherwise age indeterminate. 2. Chronic appearing invagination of the superior endplates of L2, L3, and L4. Electronically Signed   By: Randa Ngo M.D.   On: 08/11/2020 18:55   CT Head Wo Contrast  Result Date: 08/11/2020 CLINICAL DATA:  [place] several days ago EXAM: CT HEAD WITHOUT CONTRAST TECHNIQUE: Contiguous axial images were obtained from the base of the skull through the vertex without intravenous contrast. COMPARISON:  03/05/2018 FINDINGS: Brain: Confluent hypodensities throughout the periventricular white matter are compatible with chronic small  vessel ischemic changes. No signs of acute infarct or hemorrhage. No acute extra-axial fluid collections. No mass effect. Vascular: Calcified supraclinoid right ICA aneurysm measures 2.5 x 1.6 cm in transverse dimension unchanged since prior study. No hyperdense vessel. Skull: Postsurgical changes from previous right frontotemporal craniotomy. No acute bony abnormalities. Sinuses/Orbits: No acute finding. Other: None. IMPRESSION: 1. Stable chronic small-vessel ischemic changes. No acute intracranial process. 2. Stable calcified supraclinoid right ICA aneurysm. Electronically Signed   By: Randa Ngo M.D.   On: 08/11/2020 18:45   DG Hip Unilat W or Wo Pelvis 2-3 Views Right  Result Date: 08/11/2020 CLINICAL DATA:  [place] several days ago, inability to bear weight, right hip pain EXAM: DG HIP (WITH OR WITHOUT PELVIS) 2-3V RIGHT COMPARISON:  08/20/2018 FINDINGS: Frontal view of the pelvis as well as frontal and frogleg lateral views of the right hip are obtained. No acute fracture, subluxation, or dislocation. Stable mild bilateral hip osteoarthritis. Sacroiliac joints are unremarkable. IMPRESSION: 1. No acute displaced fracture. 2. Stable osteoarthritis. Electronically Signed   By: Randa Ngo M.D.   On: 08/11/2020 18:51    Procedures Procedures   Medications Ordered in ED Medications - No data to display  ED Course  I have reviewed the triage vital signs and the nursing notes.  Pertinent labs & imaging results that were available during my care of the patient were reviewed by me and considered in my medical decision making (see chart for details).    MDM Rules/Calculators/A&P  Elderly male presenting today for recurrent falls at home and generalized weakness.  Patient has multiple reasons to have falls and weakness including Parkinson's disease, prior brain aneurysm with known brain damage and balance problems and polypharmacy.  Patient is awake and alert at this time  but is diffusely weak.  He is complaining of pain in the right hip and lower back concerning for an injury from his most recent falls.  Vital signs are reassuring at this time.  Labs and imaging are pending.  Patient currently is living home alone and even family has been staying with him but every time they leave the room he is on the floor again.  He is not safe in his own home at this time.  9:03 PM Patient's labs are reassuring and no evidence of infection today.  Head CT without acute injury.  Chest x-ray within normal limits, lumbar spine films show a 50% height loss and a compression wedge fracture at L5.  Patient has been having worsening pain since the fall on Monday and expect this is most likely acute.  Spoke with neurosurgery with [facility] and he recommended using an LSO brace with a QuickDraw for comfort and pain control.  He does not have any restrictions but given patient's recurrent falls at home he is not safe to return.  He will most likely need rehab placement vs possibly permanent placement.    MDM Number of Diagnoses or Management Options   Amount and/or Complexity of Data Reviewed Clinical lab tests: ordered and reviewed Tests in the radiology section of CPT: ordered and reviewed Tests in the medicine section of CPT: ordered and reviewed Decide to obtain previous medical records or to obtain history from someone other than the patient: yes Obtain history from someone other than the patient: yes Review and summarize past medical records: yes Discuss the patient with other providers: yes Independent visualization of images, tracings, or specimens: yes  Risk of Complications, Morbidity, and/or Mortality Presenting problems: moderate Diagnostic procedures: low Management options: low  Patient Progress Patient progress: stable   Final Clinical Impression(s) / ED Diagnoses Final diagnoses:  Fall, initial encounter  Compression fracture of L5 vertebra, initial  encounter [facility])    Rx / DC Orders ED Discharge Orders    None       Blanchie Dessert, MD 08/11/20 2106

## 2020-08-12 DIAGNOSIS — W19XXXA Unspecified fall, initial encounter: Secondary | ICD-10-CM | POA: Diagnosis not present

## 2020-08-12 DIAGNOSIS — E119 Type 2 diabetes mellitus without complications: Secondary | ICD-10-CM | POA: Diagnosis not present

## 2020-08-12 LAB — COMPREHENSIVE METABOLIC PANEL WITH GFR
ALT: 18 U/L (ref 0–44)
AST: 22 U/L (ref 15–41)
Albumin: 3.5 g/dL (ref 3.5–5.0)
Alkaline Phosphatase: 70 U/L (ref 38–126)
Anion gap: 10 (ref 5–15)
BUN: 20 mg/dL (ref 8–23)
CO2: 23 mmol/L (ref 22–32)
Calcium: 8.9 mg/dL (ref 8.9–10.3)
Chloride: 105 mmol/L (ref 98–111)
Creatinine, Ser: 0.74 mg/dL (ref 0.61–1.24)
GFR, Estimated: >60 mL/min (ref >60)
Glucose, Bld: 87 mg/dL (ref 70–99)
Potassium: 3.8 mmol/L (ref 3.5–5.1)
Sodium: 138 mmol/L (ref 135–145)
Total Bilirubin: 0.9 mg/dL (ref 0.3–1.2)
Total Protein: 6.6 g/dL (ref 6.5–8.1)

## 2020-08-12 LAB — HEMOGLOBIN A1C
Hgb A1c MFr Bld: 5.6 % (ref 4.8–5.6)
Mean Plasma Glucose: 114.02 mg/dL

## 2020-08-12 LAB — GLUCOSE, CAPILLARY
Glucose-Capillary: 87 mg/dL (ref 70–99)
Glucose-Capillary: 92 mg/dL (ref 70–99)
Glucose-Capillary: 108 mg/dL — ABNORMAL HIGH (ref 70–99)

## 2020-08-12 LAB — CBC
HCT: 42.9 % (ref 39.0–52.0)
Hemoglobin: 13.9 g/dL (ref 13.0–17.0)
MCH: 28.5 pg (ref 26.0–34.0)
MCHC: 32.4 g/dL (ref 30.0–36.0)
MCV: 87.9 fL (ref 80.0–100.0)
Platelets: 235 K/uL (ref 150–400)
RBC: 4.88 MIL/uL (ref 4.22–5.81)
RDW: 13.4 % (ref 11.5–15.5)
WBC: 7.8 K/uL (ref 4.0–10.5)
nRBC: 0 % (ref 0.0–0.2)

## 2020-08-12 MED ORDER — LACOSAMIDE 50 MG PO TABS: 100.0000 mg | ORAL_TABLET | Freq: Two times a day (BID) | ORAL | Status: DC

## 2020-08-12 MED ORDER — LEVETIRACETAM 500 MG PO TABS
1500.0000 mg | ORAL_TABLET | Freq: Two times a day (BID) | ORAL | Status: DC
  Administered 2020-08-12 – 2020-08-16 (×9): 1500 mg via ORAL
  Filled 2020-08-12 (×9): qty 3

## 2020-08-12 MED ORDER — VITAMIN B-12 1000 MCG PO TABS
2000.0000 ug | ORAL_TABLET | Freq: Every day | ORAL | Status: DC
  Administered 2020-08-12 – 2020-08-15 (×4): 2000 ug via ORAL
  Filled 2020-08-12 (×4): qty 2

## 2020-08-12 MED ORDER — CARBIDOPA-LEVODOPA 25-100 MG PO TABS
1.0000 | ORAL_TABLET | Freq: Two times a day (BID) | ORAL | Status: DC
  Administered 2020-08-12 – 2020-08-16 (×9): 1 via ORAL
  Filled 2020-08-12 (×9): qty 1

## 2020-08-12 MED ORDER — AMANTADINE HCL 100 MG PO CAPS
100.0000 mg | ORAL_CAPSULE | Freq: Every day | ORAL | Status: DC
  Administered 2020-08-12 – 2020-08-16 (×5): 100 mg via ORAL
  Filled 2020-08-12 (×5): qty 1

## 2020-08-12 MED ORDER — ORAL CARE MOUTH RINSE
15.0000 mL | Freq: Two times a day (BID) | OROMUCOSAL | Status: DC
  Administered 2020-08-12 – 2020-08-16 (×7): 15 mL via OROMUCOSAL

## 2020-08-12 MED ORDER — LEVOTHYROXINE SODIUM 75 MCG PO TABS
75.0000 ug | ORAL_TABLET | Freq: Every day | ORAL | Status: DC
  Administered 2020-08-12 – 2020-08-16 (×3): 75 ug via ORAL
  Filled 2020-08-12 (×5): qty 1

## 2020-08-12 MED ORDER — LACOSAMIDE 50 MG PO TABS
100.0000 mg | ORAL_TABLET | Freq: Two times a day (BID) | ORAL | Status: DC
  Administered 2020-08-12 – 2020-08-15 (×8): 100 mg via ORAL
  Filled 2020-08-12 (×7): qty 2

## 2020-08-12 NOTE — Plan of Care (Signed)
Plan of care initiated.

## 2020-08-12 NOTE — Care Management Obs Status (Signed)
[place] NOTIFICATION   Patient Details  Name: Gerald Leach MRN: [id number] Date of Birth: [birth year]   Medicare Observation Status Notification Given:  Macario Golds, [place] 08/12/2020, 2:36 PM

## 2020-08-12 NOTE — Evaluation (Signed)
Physical Therapy Evaluation Patient Details Name: Gerald Leach MRN: [id number] DOB: 03-Jul-1933 Today's Date: 08/12/2020   History of Present Illness  Gerald Leach is an 87 y.o. male with medical history significant of dementia, diabetes, hypertension, hyperlipidemia, brain aneurysm with previous bleeds status post 2 surgeries in the past, Parkinson's disease, seizure disorder, gait abnormalities with fall with brought in by family today due to worsening falls at home.Work up in the ER showed L5 compression fracture. Discussion with neurosurgery regarding his compression fracture.  Recommendation is supportive care.  Clinical Impression  The patient  Expresses fear of falling during mobility. Patient assisted with rolling, required 2 person max assistance to sit up to bed edge. Don/Doff LSO in seated position as rolling was difficult and painful. Patient gradually gained sitting balance at mid line. Patient required max assist of 2 to stand at [facility]. Patient required assistance to steady and  Balance while standing. Patient took 3 small side steps along the bed. Max of 2 to return to supine, assisting with trunk and legs.  Patient has been residing alone and can benefit from post acute.sign rehab. Pt admitted with above diagnosis.Pt currently with functional limitations due to the deficits listed below (see PT Problem List). Pt will benefit from skilled PT to increase their independence and safety with mobility to allow discharge to the venue listed below.       Follow Up Recommendations SNF    Equipment Recommendations  None recommended by PT    Recommendations for Other Services       Precautions / Restrictions Precautions Precautions: Fall Required Braces or Orthoses: Spinal Brace Spinal Brace: Applied in sitting position (no specific orders for position)      Mobility  Bed Mobility Overal bed mobility: Needs Assistance Bed Mobility: Rolling;Sidelying to Sit;Sit to  Sidelying Rolling: Mod assist Sidelying to sit: Max assist;+2 for physical assistance;+2 for safety/equipment     Sit to sidelying: Max assist;+2 for safety/equipment;+2 for physical assistance General bed mobility comments: Patient able to flex knees  in prep to roll to left, reaches  for rail and assists rolling. Required extensive assistance to place legs over bed edge, patient somewhat resistive, stating" I am afraid i will fall." Max assist with trunk, Max o 2 to return to side and then supine. Placed in partial bed /chair position.    Transfers  +2 Max assist to power up from bed raised , Holding  Onto RW, bed pad used to power up pelvis. Patient's left foot tending to side step and forward. Patient  Did take 3 side steps along the  Bed prior to sitting down.                  Ambulation/Gait                Stairs            Wheelchair Mobility    Modified Rankin (Stroke Patients Only)       Balance Overall balance assessment: History of Falls;Needs assistance Sitting-balance support: Feet supported;Bilateral upper extremity supported Sitting balance-Leahy Scale: Poor Sitting balance - Comments: initially posterior retropulsion, gradually able to  gain balance and sat near midline with min guard. Postural control: Posterior lean Standing balance support: During functional activity;Bilateral upper extremity supported Standing balance-Leahy Scale: Poor Standing balance comment: reliant on RW and support for posterior bias  Pertinent Vitals/Pain Pain Assessment: Faces Faces Pain Scale: Hurts little more Pain Location: right posterior hip Pain Descriptors / Indicators: Discomfort Pain Intervention(s): Monitored during session;Premedicated before session;Repositioned    Home Living Family/patient expects to be discharged to:: Private residence Living Arrangements: Alone Available Help at Discharge: Available  PRN/intermittently Type of Home: House Home Access: Stairs to enter Entrance Stairs-Rails:  (unsure) Entrance Stairs-Number of Steps: 3-4 Home Layout: Multi-level;Able to live on main level with bedroom/bathroom Home Equipment: Walker - 4 wheels;Grab bars - tub/shower      Prior Function Level of Independence: Independent with assistive device(s)         Comments: Family gets groceries, patient independnet in basic ADL's, able to negotiate steps until recent falls.     Hand Dominance   Dominant Hand: Right    Extremity/Trunk Assessment   Upper Extremity Assessment Upper Extremity Assessment: Generalized weakness    Lower Extremity Assessment Lower Extremity Assessment: Generalized weakness    Cervical / Trunk Assessment Cervical / Trunk Assessment: Kyphotic  Communication   Communication: HOH  Cognition Arousal/Alertness: Awake/alert Behavior During Therapy: WFL for tasks assessed/performed Overall Cognitive Status: Within Functional Limits for tasks assessed                                        General Comments      Exercises     Assessment/Plan    PT Assessment Patient needs continued PT services  PT Problem List Decreased strength;Decreased mobility;Decreased safety awareness;Decreased knowledge of precautions;Decreased activity tolerance;Decreased balance;Pain       PT Treatment Interventions DME instruction;Therapeutic activities;Gait training;Therapeutic exercise;Patient/family education;Functional mobility training;Balance training    PT Goals (Current goals can be found in the Care Plan section)  Acute Rehab PT Goals Patient Stated Goal: i want to walk and go home PT Goal Formulation: With patient Time For Goal Achievement: 08/26/20 Potential to Achieve Goals: Fair    Frequency Min 2X/week   Barriers to discharge Decreased caregiver support      Co-evaluation               AM-PAC PT "6 Clicks" Mobility  Outcome  Measure Help needed turning from your back to your side while in a flat bed without using bedrails?: A Lot Help needed moving from lying on your back to sitting on the side of a flat bed without using bedrails?: A Lot Help needed moving to and from a bed to a chair (including a wheelchair)?: A Lot Help needed standing up from a chair using your arms (e.g., wheelchair or bedside chair)?: A Lot Help needed to walk in hospital room?: Total Help needed climbing 3-5 steps with a railing? : Total 6 Click Score: 10    End of Session Equipment Utilized During Treatment: Gait belt;Back brace Activity Tolerance: Patient tolerated treatment well Patient left: in bed;with call bell/phone within reach;with bed alarm set Nurse Communication: Mobility status PT Visit Diagnosis: Unsteadiness on feet (R26.81);Muscle weakness (generalized) (M62.81);Difficulty in walking, not elsewhere classified (R26.2);Repeated falls (R29.6)    Time: 0867-6195 PT Time Calculation (min) (ACUTE ONLY): 42 min   Charges:   PT Evaluation $PT Eval Low Complexity: 1 Low PT Treatments $Therapeutic Activity: 23-37 mins        Tresa Endo PT Acute Rehabilitation Services Pager [phone] Office [phone]  Claretha Cooper 08/12/2020, 1:23 PM

## 2020-08-12 NOTE — Care Management CC44 (Signed)
Condition Code 44 Documentation Completed  Patient Details  Name: Gerald Leach MRN: [id number] Date of Birth: 05/15/1934   Condition Code 44 given:  Yes Patient signature on Condition Code 44 notice:  Yes Documentation of 2 MD's agreement:  Yes Code 44 added to claim:  Yes    Nikolas Casher, LCSW 08/12/2020, 2:36 PM

## 2020-08-12 NOTE — Progress Notes (Signed)
PROGRESS NOTE    Gerald Leach  NKN:[id number] DOB: Mar 01, 1934 DOA: 08/11/2020 PCP: [name], NP     Brief Narrative:  Gerald Leach is an 87 y.o. male with medical history significant of dementia, diabetes, hypertension, hyperlipidemia, brain aneurysm with previous bleeds status post 2 surgeries in the past, Parkinson's disease, seizure disorder, gait abnormalities with fall with brought in by family today due to worsening falls at home.  Patient has been more progressively weak.  He has had at least 4 falls in the last 3 days.  He is unable to stay alone.  Most of the time cannot get up and has to stay down for a while until a family member arrives.  Patient is unable to take care of himself. Work up in the ER showed L5 compression fracture. Discussion with neurosurgery regarding his compression fracture.  Recommendation is supportive care.    New events last 24 hours / Subjective: Patient alert and oriented x3 this morning, but overall a poor historian.  Denies any significant pain.  Assessment & Plan:   Principal Problem:   Fall Active Problems:   GERD   Diabetes mellitus type 2, controlled ([place])   Hypothyroidism   Parkinson disease ([place])   Gait instability   Seizures (HCC)   Closed compression fracture of L5 lumbar vertebra, initial encounter ([place])   Recurrent falls with gait abnormality, history of vascular parkinsonism, history of subdural hematoma s/p craniotomy in 2014  -Concern for return back to his home setting, lives at home alone and has had multiple falls  -Continue sinemet  -PT OT  L5 compression fracture -Lumbar spine xray: Anterior wedge compression deformity of the L5 vertebral body, with approximately 50% loss of height. This is new since 2019, but otherwise age indeterminate. Chronic appearing invagination of the superior endplates of L2, L3, and L4. -Neurosurgery has recommended supportive care, brace  Diabetes -Ha1c 5.6  -Sliding-scale  insulin  Hypothyroidism  -Continue synthroid   Generalized seizure disorder due to subdural hematoma 09/2012 -Follows with Dr. Narda [facility] -Continue Keppra and vimpat   Multifactorial dementia, moderate -Continue amantidine   DVT prophylaxis:  enoxaparin (LOVENOX) injection 40 mg Start: 08/11/20 2345  Code Status: Full code Family Communication: No family at bedside, discussed with son over the phone Disposition Plan:  Status is: Inpatient  Remains inpatient appropriate because:Unsafe d/c plan   Dispo: The patient is from: Home              Anticipated d/c is to: SNF              Patient currently is medically stable to d/c.  Awaiting PT OT recommendations, suspect he may need SNF placement   Difficult to place patient No   Antimicrobials:  Anti-infectives (From admission, onward)   None        Objective: Vitals:   08/11/20 2256 08/12/20 0235 08/12/20 0600 08/12/20 1100  BP:  116/75 122/71 140/69  Pulse:  (!) 53 62 (!) 54  Resp:  20 18 18   Temp: 97.6 F (36.4 C) 97.7 F (36.5 C) 98.5 F (36.9 C) 97.6 F (36.4 C)  TempSrc: Oral Oral Oral Oral  SpO2:  98% 98% 99%  Weight:      Height:        Intake/Output Summary (Last 24 hours) at 08/12/2020 1247 Last data filed at 08/12/2020 1130 Gross per 24 hour  Intake 988.9 ml  Output -  Net 988.9 ml   [employer]  08/11/20 1553  Weight: 74.8 kg    Examination:  General exam: Appears calm and comfortable  Respiratory system: Clear to auscultation. Respiratory effort normal. No respiratory distress. No conversational dyspnea.  Cardiovascular system: S1 & S2 heard, RRR. No murmurs. No pedal edema. Gastrointestinal system: Abdomen is nondistended, soft and nontender. Normal bowel sounds heard. Central nervous system: Alert and oriented x3. No focal neurological deficits.  Extremities: Symmetric in appearance  Skin: No rashes, lesions or ulcers on exposed skin, wound on tip of third right toe, does  not appear to be actively infected     Psychiatry: Stable  Data Reviewed: I have personally reviewed following labs and imaging studies  CBC: Recent Labs  Lab 08/11/20 1644 08/11/20 2315 08/12/20 0307  WBC 9.8 8.1 7.8  NEUTROABS 6.4  --   --   HGB 15.7 15.1 13.9  HCT 48.2 46.4 42.9  MCV 88.6 88.4 87.9  PLT 260 165 127   Basic Metabolic Panel: Recent Labs  Lab 08/11/20 1644 08/11/20 2315 08/12/20 0307  NA 137  --  138  K 3.7  --  3.8  CL 100  --  105  CO2 26  --  23  GLUCOSE 91  --  87  BUN 19  --  20  CREATININE 0.88 0.87 0.74  CALCIUM 9.4  --  8.9   GFR: Estimated Creatinine Clearance: 72.7 mL/min (by C-G formula based on SCr of 0.74 mg/dL). Liver Function Tests: Recent Labs  Lab 08/11/20 1644 08/12/20 0307  AST 24 22  ALT 12 18  ALKPHOS 80 70  BILITOT 0.9 0.9  PROT 8.2* 6.6  ALBUMIN 4.1 3.5   No results for input(s): LIPASE, AMYLASE in the last 168 hours. No results for input(s): AMMONIA in the last 168 hours. Coagulation Profile: No results for input(s): INR, PROTIME in the last 168 hours. Cardiac Enzymes: No results for input(s): CKTOTAL, CKMB, CKMBINDEX, TROPONINI in the last 168 hours. BNP (last 3 results) No results for input(s): PROBNP in the last 8760 hours. HbA1C: Recent Labs    08/11/20 2315  HGBA1C 5.6   CBG: Recent Labs  Lab 08/11/20 2255 08/12/20 0752  GLUCAP 161* 87   Lipid Profile: No results for input(s): CHOL, HDL, LDLCALC, TRIG, CHOLHDL, LDLDIRECT in the last 72 hours. Thyroid Function Tests: No results for input(s): TSH, T4TOTAL, FREET4, T3FREE, THYROIDAB in the last 72 hours. Anemia Panel: No results for input(s): VITAMINB12, FOLATE, FERRITIN, TIBC, IRON, RETICCTPCT in the last 72 hours. Sepsis Labs: No results for input(s): PROCALCITON, LATICACIDVEN in the last 168 hours.  Recent Results (from the past 240 hour(s))  Resp Panel by RT-PCR (Flu A&B, Covid) Nasopharyngeal Swab     Status: None   Collection Time:  08/11/20  9:48 PM   Specimen: Nasopharyngeal Swab; Nasopharyngeal(NP) swabs in vial transport medium  Result Value Ref Range Status   SARS Coronavirus 2 by RT PCR NEGATIVE NEGATIVE Final    Comment: (NOTE) SARS-CoV-2 target nucleic acids are NOT DETECTED.  The SARS-CoV-2 RNA is generally detectable in upper respiratory specimens during the acute phase of infection. The lowest concentration of SARS-CoV-2 viral copies this assay can detect is 138 copies/mL. A negative result does not preclude SARS-Cov-2 infection and should not be used as the sole basis for treatment or other patient management decisions. A negative result may occur with  improper specimen collection/handling, submission of specimen other than nasopharyngeal swab, presence of viral mutation(s) within the areas targeted by this assay, and inadequate number of  viral copies(<138 copies/mL). A negative result must be combined with clinical observations, patient history, and epidemiological information. The expected result is Negative.  Fact Sheet for Patients:  EntrepreneurPulse.com.au  Fact Sheet for Healthcare Providers:  IncredibleEmployment.be  This test is no t yet approved or cleared by the [place] FDA and  has been authorized for detection and/or diagnosis of SARS-CoV-2 by FDA under an Emergency Use Authorization (EUA). This EUA will remain  in effect (meaning this test can be used) for the duration of the COVID-19 declaration under Section 564(b)(1) of the Act, 21 U.S.C.section 360bbb-3(b)(1), unless the authorization is terminated  or revoked sooner.       Influenza A by PCR NEGATIVE NEGATIVE Final   Influenza B by PCR NEGATIVE NEGATIVE Final    Comment: (NOTE) The Xpert Xpress SARS-CoV-2/FLU/RSV plus assay is intended as an aid in the diagnosis of influenza from Nasopharyngeal swab specimens and should not be used as a sole basis for treatment. Nasal washings  and aspirates are unacceptable for Xpert Xpress SARS-CoV-2/FLU/RSV testing.  Fact Sheet for Patients: EntrepreneurPulse.com.au  Fact Sheet for Healthcare Providers: IncredibleEmployment.be  This test is not yet approved or cleared by the [place] FDA and has been authorized for detection and/or diagnosis of SARS-CoV-2 by FDA under an Emergency Use Authorization (EUA). This EUA will remain in effect (meaning this test can be used) for the duration of the COVID-19 declaration under Section 564(b)(1) of the Act, 21 U.S.C. section 360bbb-3(b)(1), unless the authorization is terminated or revoked.  Performed at [facility], [place] [address] [place], [place] [address]       Radiology Studies: DG Chest 2 View  Result Date: 08/11/2020 CLINICAL DATA:  [place] several days ago, back pain EXAM: CHEST - 2 VIEW COMPARISON:  03/07/2018 FINDINGS: Frontal and lateral views of the chest demonstrate an unremarkable cardiac silhouette. No airspace disease, effusion, or pneumothorax. Chronic midthoracic wedge compression deformity again noted. Invagination of the superior endplates in the upper and mid lumbar spine identified, age indeterminate. Please refer to separately reported lumbar spine series. IMPRESSION: 1. No acute intrathoracic process. 2. Chronic midthoracic wedge compression deformity. 3. Age indeterminate compression fractures lumbar spine, please refer to dedicated lumbar spine evaluation performed concurrently. Electronically Signed   By: Randa Ngo M.D.   On: 08/11/2020 18:49   DG Lumbar Spine Complete  Result Date: 08/11/2020 CLINICAL DATA:  [place] several days ago, inability to bear weight, right hip pain EXAM: LUMBAR SPINE - COMPLETE 4+ VIEW COMPARISON:  03/11/2018 FINDINGS: Frontal, bilateral oblique, lateral views of the lumbar spine are obtained. There are 5 non-rib-bearing lumbar type vertebral bodies, with hemi  sacralization of the L5 vertebral body on the right. There is anterior wedge compression deformity of the L5 vertebral body, with approximately 50% loss of height. No retropulsion. This is age indeterminate, but new since 2019. Mild invagination of the superior endplates of L2, L3, and L4 age indeterminate but likely chronic. Prominent left convex scoliosis centered at L3. Severe facet hypertrophy throughout the entire lumbar spine, greatest at the lumbosacral junction. Sacroiliac joints are unremarkable. IMPRESSION: 1. Anterior wedge compression deformity of the L5 vertebral body, with approximately 50% loss of height. This is new since 2019, but otherwise age indeterminate. 2. Chronic appearing invagination of the superior endplates of L2, L3, and L4. Electronically Signed   By: Randa Ngo M.D.   On: 08/11/2020 18:55   CT Head Wo Contrast  Result Date: 08/11/2020 CLINICAL DATA:  [place] several days  ago EXAM: CT HEAD WITHOUT CONTRAST TECHNIQUE: Contiguous axial images were obtained from the base of the skull through the vertex without intravenous contrast. COMPARISON:  03/05/2018 FINDINGS: Brain: Confluent hypodensities throughout the periventricular white matter are compatible with chronic small vessel ischemic changes. No signs of acute infarct or hemorrhage. No acute extra-axial fluid collections. No mass effect. Vascular: Calcified supraclinoid right ICA aneurysm measures 2.5 x 1.6 cm in transverse dimension unchanged since prior study. No hyperdense vessel. Skull: Postsurgical changes from previous right frontotemporal craniotomy. No acute bony abnormalities. Sinuses/Orbits: No acute finding. Other: None. IMPRESSION: 1. Stable chronic small-vessel ischemic changes. No acute intracranial process. 2. Stable calcified supraclinoid right ICA aneurysm. Electronically Signed   By: Randa Ngo M.D.   On: 08/11/2020 18:45   DG Hip Unilat W or Wo Pelvis 2-3 Views Right  Result Date: 08/11/2020 CLINICAL  DATA:  [place] several days ago, inability to bear weight, right hip pain EXAM: DG HIP (WITH OR WITHOUT PELVIS) 2-3V RIGHT COMPARISON:  08/20/2018 FINDINGS: Frontal view of the pelvis as well as frontal and frogleg lateral views of the right hip are obtained. No acute fracture, subluxation, or dislocation. Stable mild bilateral hip osteoarthritis. Sacroiliac joints are unremarkable. IMPRESSION: 1. No acute displaced fracture. 2. Stable osteoarthritis. Electronically Signed   By: Randa Ngo M.D.   On: 08/11/2020 18:51      Scheduled Meds: . amantadine  100 mg Oral Daily  . carbidopa-levodopa  1 tablet Oral BID  . enoxaparin (LOVENOX) injection  40 mg Subcutaneous Q24H  . insulin aspart  0-5 Units Subcutaneous QHS  . insulin aspart  0-9 Units Subcutaneous TID WC  . lacosamide  100 mg Oral BID  . levETIRAcetam  1,500 mg Oral BID  . levothyroxine  75 mcg Oral Q0600  . vitamin B-12  2,000 mcg Oral QHS   Continuous Infusions: . sodium chloride 100 mL/hr at 08/12/20 1028     LOS: 1 day      Time spent: 30 minutes   Dessa Phi, DO [facility] 08/12/2020, 12:47 PM   Available via Epic secure chat 7am-7pm After these hours, please refer to coverage provider listed on [email or web address]

## 2020-08-12 NOTE — NC FL2 (Signed)
[place] LEVEL OF CARE SCREENING TOOL     IDENTIFICATION  Patient Name: ITALO BANTON Birthdate: [birth year] Sex: male Admission Date (Current Location): 08/11/2020  [facility] and [place] Number:  Herbalist and Address:  [facility],  [place], [place]      Provider Number: [id number]  Attending Physician Name and Address:  Dessa Phi, DO  Relative Name and Phone Number:  son, Tony Granquist @ [phone]    Current Level of Care: Hospital Recommended Level of Care: [place] Prior Approval Number:    Date Approved/Denied:   PASRR Number: [id number]  Discharge Plan: SNF    Current Diagnoses: Patient Active Problem List   Diagnosis Date Noted  . Fall 08/11/2020  . Closed compression fracture of L5 lumbar vertebra, initial encounter ([place]) 08/11/2020  . Seizures ([place]) 03/05/2018  . Leukocytosis 03/05/2018  . Diabetic foot ulcers ([place]) 04/23/2017  . Parkinson disease ([place])   . Gait instability   . Diabetes mellitus type 2, controlled ([place]) 08/05/2015  . Hypothyroidism 08/05/2015  . Fall at home 10/02/2014  . CNS aneurysm s/p coiling ([facility], 2002) 10/02/2014  . High grade T7 central canal stenosis with T7 vertebral fracture s/p fall (02/2012) 10/02/2014  . 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 10/02/2014  . Syncope 02/05/2014  . Drug-induced delirium(292.81) 05/16/2013  . Seizure disorder ([place]) 04/21/2013  . Seizure disorder, grand mal ([place]) 03/24/2013  . Cervical compression fracture---C7 10/29/2012  . Dysphagia 09/21/2012  . Subdural hematoma ([place]) 09/20/2012  . SOLAR KERATOSIS 02/17/2009  . CARCINOMA, SKIN, SQUAMOUS CELL, FACE 04/19/2008  . Hyperlipidemia, group D 01/27/2008  . COLONIC POLYPS 11/15/2006  . GERD 11/15/2006    Orientation RESPIRATION BLADDER Height & Weight     Self,Place  Normal Incontinent,External catheter Weight: 165  lb (74.8 kg) Height:  5\' 11"  (180.3 cm)  BEHAVIORAL SYMPTOMS/MOOD NEUROLOGICAL BOWEL NUTRITION STATUS    Convulsions/Seizures (h/o) Continent Diet  AMBULATORY STATUS COMMUNICATION OF NEEDS Skin   Extensive Assist Verbally Normal                       Personal Care Assistance Level of Assistance  Bathing,Feeding,Dressing Bathing Assistance: Limited assistance Feeding assistance: Limited assistance Dressing Assistance: Limited assistance     Functional Limitations Info             SPECIAL CARE FACTORS FREQUENCY  PT (By licensed PT),OT (By licensed OT)     PT Frequency: 5x/wk OT Frequency: 5x/wk            Contractures Contractures Info: Not present    Additional Factors Info  Code Status,Allergies Code Status Info: FULL Allergies Info: NKDA           Current Medications (08/12/2020):  This is the current hospital active medication list Current Facility-Administered Medications  Medication Dose Route Frequency Provider Last Rate Last Admin  . acetaminophen (TYLENOL) tablet 650 mg  650 mg Oral Q6H PRN Elwyn Reach, MD   650 mg at 08/12/20 1024   Or  . acetaminophen (TYLENOL) suppository 650 mg  650 mg Rectal Q6H PRN Elwyn Reach, MD      . amantadine (SYMMETREL) capsule 100 mg  100 mg Oral Daily Gala Romney L, MD   100 mg at 08/12/20 1024  . carbidopa-levodopa (SINEMET IR) 25-100 MG per tablet immediate release 1 tablet  1 tablet Oral BID Elwyn Reach, MD   1  tablet at 08/12/20 1024  . enoxaparin (LOVENOX) injection 40 mg  40 mg Subcutaneous Q24H Gala Romney L, MD   40 mg at 08/11/20 2304  . insulin aspart (novoLOG) injection 0-5 Units  0-5 Units Subcutaneous QHS Garba, Mohammad L, MD      . insulin aspart (novoLOG) injection 0-9 Units  0-9 Units Subcutaneous TID WC Garba, Mohammad L, MD      . lacosamide (VIMPAT) tablet 100 mg  100 mg Oral BID Gala Romney L, MD   100 mg at 08/12/20 1047  . levETIRAcetam (KEPPRA) tablet 1,500 mg  1,500  mg Oral BID Gala Romney L, MD   1,500 mg at 08/12/20 1024  . levothyroxine (SYNTHROID) tablet 75 mcg  75 mcg Oral Q0600 Elwyn Reach, MD   75 mcg at 08/12/20 0653  . morphine 2 MG/ML injection 2 mg  2 mg Intravenous Q2H PRN Gala Romney L, MD      . ondansetron (ZOFRAN) tablet 4 mg  4 mg Oral Q6H PRN Elwyn Reach, MD       Or  . ondansetron (ZOFRAN) injection 4 mg  4 mg Intravenous Q6H PRN Gala Romney L, MD      . vitamin B-12 (CYANOCOBALAMIN) tablet 2,000 mcg  2,000 mcg Oral QHS Elwyn Reach, MD         Discharge Medications: Please see discharge summary for a list of discharge medications.  Relevant Imaging Results:  Relevant Lab Results:   Additional Information SSN [id number]  Lennart Pall, LCSW

## 2020-08-12 NOTE — TOC Initial Note (Signed)
Transition of Care [facility]) - Initial/Assessment Note    Patient Details  Name: Gerald Leach MRN: [id number] Date of Birth: [birth year]  [facility] CM/SW Contact:    Lennart Pall, LCSW Phone Number: 08/12/2020, 2:01 PM  Clinical Narrative:                 Met with pt and son today to review pt's current care needs and limitations.  Both aware that PT has recommended SNF for rehab and pt unsure if this is needed, however, son in full agreement.  Son notes pt has been to SNF x2 in the past years.  Pt's wife is in memory care unit at [facility].  Son notes pt has not been managing well at home with increasing falls. Son understands that SNF will be a short term plan and he is already working on plan for longer term care after SNF.  TOC will begin SNF bed search and will need insurance authorization as well.    Expected Discharge Plan: Skilled Nursing Facility Barriers to Discharge: Insurance Authorization,Continued Medical Work up   Patient Goals and CMS Choice Patient states their goals for this hospitalization and ongoing recovery are:: to go home CMS Medicare.gov Compare Post Acute Care list provided to:: Patient Choice offered to / list presented to : Adult Children  Expected Discharge Plan and Services Expected Discharge Plan: [place] In-house Referral: Clinical Social Work   Post Acute Care Choice: [place] Living arrangements for the past 2 months: [place]                                      Prior Living Arrangements/Services Living arrangements for the past 2 months: Single Family Home Lives with:: Self Patient language and need for interpreter reviewed:: Yes Do you feel safe going back to the place where you live?: Yes      Need for Family Participation in Patient Care: Yes (Comment) Care giver support system in place?: No (comment)   Criminal Activity/Legal Involvement Pertinent to Current  Situation/Hospitalization: No - Comment as needed  Activities of Daily Living      Permission Sought/Granted   Permission granted to share information with : Yes, Verbal Permission Granted  Share Information with NAME: Musa Rewerts     Permission granted to share info w Relationship: son  Permission granted to share info w Contact Information: [phone]  Emotional Assessment Appearance:: Appears stated age Attitude/Demeanor/Rapport: Gracious,Lethargic Affect (typically observed): Accepting Orientation: : Oriented to Self,Oriented to Place,Oriented to Situation Alcohol / Substance Use: Not Applicable Psych Involvement: No (comment)  Admission diagnosis:  Fall [W19.XXXA] Fall, initial encounter [id number]] Compression fracture of L5 vertebra, initial encounter ([place]) [S32.050A] Closed compression fracture of L5 lumbar vertebra, initial encounter [facility]) [W29.562Z] Patient Active Problem List   Diagnosis Date Noted  . Fall 08/11/2020  . Closed compression fracture of L5 lumbar vertebra, initial encounter ([place]) 08/11/2020  . Seizures ([place]) 03/05/2018  . Leukocytosis 03/05/2018  . Diabetic foot ulcers ([place]) 04/23/2017  . Parkinson disease ([place])   . Gait instability   . Diabetes mellitus type 2, controlled ([place]) 08/05/2015  . Hypothyroidism 08/05/2015  . Fall at home 10/02/2014  . CNS aneurysm s/p coiling ([facility], 2002) 10/02/2014  . High grade T7 central canal stenosis with T7 vertebral fracture s/p fall (02/2012) 10/02/2014  . Right clinoid calcified meningioma vs thrombosed giant ICA aneurysm,  conservative management. 10/02/2014  . Syncope 02/05/2014  . Drug-induced delirium(292.81) 05/16/2013  . Seizure disorder ([place]) 04/21/2013  . Seizure disorder, grand mal ([employer]) 03/24/2013  . Cervical compression fracture---C7 10/29/2012  . Dysphagia 09/21/2012  . Subdural hematoma ([place]) 09/20/2012  . SOLAR KERATOSIS 02/17/2009  . CARCINOMA, SKIN, SQUAMOUS CELL, FACE 04/19/2008  .  Hyperlipidemia, group D 01/27/2008  . COLONIC POLYPS 11/15/2006  . GERD 11/15/2006   PCP:  [name], NP Pharmacy:   [id number] [place] [place]Phone: [phone]: [phone]    Social Determinants of Health (SDOH) Interventions    Readmission Risk Interventions Readmission Risk Prevention Plan 08/12/2020  Post Dischage Appt Complete  Medication Screening Complete  Transportation Screening Complete  Some recent data might be hidden

## 2020-08-13 ENCOUNTER — Encounter (HOSPITAL_COMMUNITY): Payer: Self-pay | Admitting: Internal Medicine

## 2020-08-13 DIAGNOSIS — E119 Type 2 diabetes mellitus without complications: Secondary | ICD-10-CM | POA: Diagnosis not present

## 2020-08-13 DIAGNOSIS — W19XXXA Unspecified fall, initial encounter: Secondary | ICD-10-CM | POA: Diagnosis not present

## 2020-08-13 LAB — GLUCOSE, CAPILLARY
Glucose-Capillary: 79 mg/dL (ref 70–99)
Glucose-Capillary: 94 mg/dL (ref 70–99)
Glucose-Capillary: 74 mg/dL (ref 70–99)
Glucose-Capillary: 146 mg/dL — ABNORMAL HIGH (ref 70–99)

## 2020-08-13 MED ORDER — HALOPERIDOL LACTATE 5 MG/ML IJ SOLN
1.0000 mg | Freq: Four times a day (QID) | INTRAMUSCULAR | Status: DC | PRN
  Administered 2020-08-13 – 2020-08-14 (×2): 1 mg via INTRAVENOUS
  Filled 2020-08-13 (×2): qty 1

## 2020-08-13 NOTE — Progress Notes (Signed)
Pt remains confused and weak overall. Pt attempts to crawl out of bed to leave. He states he needs to go home. Family in room. Rn will continue to monitor.

## 2020-08-13 NOTE — Plan of Care (Signed)
  Problem: Clinical Measurements: Goal: Respiratory complications will improve Outcome: Progressing   Problem: Clinical Measurements: Goal: Cardiovascular complication will be avoided Outcome: Progressing   Problem: Nutrition: Goal: Adequate nutrition will be maintained Outcome: Progressing   Problem: Coping: Goal: Level of anxiety will decrease Outcome: Progressing   Problem: Pain Managment: Goal: General experience of comfort will improve Outcome: Progressing   

## 2020-08-13 NOTE — Progress Notes (Signed)
PROGRESS NOTE    Gerald Leach  MVE:[id number] DOB: [birth year] DOA: 08/11/2020 PCP: [name], NP     Brief Narrative:  Gerald Leach is an 87 y.o. male with medical history significant of dementia, diabetes, hypertension, hyperlipidemia, brain aneurysm with previous bleeds status post 2 surgeries in the past, Parkinson's disease, seizure disorder, gait abnormalities with fall with brought in by family today due to worsening falls at home.  Patient has been more progressively weak.  He has had at least 4 falls in the last 3 days.  He is unable to stay alone.  Most of the time cannot get up and has to stay down for a while until a family member arrives.  Patient is unable to take care of himself. Work up in the ER showed L5 compression fracture. Discussion with neurosurgery regarding his compression fracture.  Recommendation is supportive care.    Patient was evaluated by physical therapy who recommended SNF placement.  New events last 24 hours / Subjective: Patient alert but remains a very poor historian today.  He denies any back pain.  Paranoid about [place] messing up his medication doses.  While taking his pills with water, he began to cough.  SLP evaluation ordered.  Assessment & Plan:   Principal Problem:   Fall Active Problems:   GERD   Diabetes mellitus type 2, controlled ([place])   Hypothyroidism   Parkinson disease ([place])   Gait instability   Seizures ([facility])   Closed compression fracture of L5 lumbar vertebra, initial encounter ([place])   Recurrent falls with gait abnormality, history of vascular parkinsonism, history of subdural hematoma s/p craniotomy in 2014  -Concern for return back to his home setting, lives at home alone and has had multiple falls  -Continue sinemet  -PT recommending SNF placement  L5 compression fracture -Lumbar spine xray: Anterior wedge compression deformity of the L5 vertebral body, with approximately 50% loss of height. This is new since 2019, but  otherwise age indeterminate. Chronic appearing invagination of the superior endplates of L2, L3, and L4. -Neurosurgery has recommended supportive care, brace  Diabetes -Ha1c 5.6  -Sliding-scale insulin  Hypothyroidism  -Continue synthroid   Generalized seizure disorder due to subdural hematoma 09/2012 -Follows with Dr. Narda [facility] -Continue Keppra and vimpat   Multifactorial dementia, moderate -Continue amantidine   DVT prophylaxis:  enoxaparin (LOVENOX) injection 40 mg Start: 08/11/20 2345  Code Status: Full code Family Communication: No family at bedside, discussed with son over the phone 2/25 Disposition Plan:  Status is: Inpatient  Remains inpatient appropriate because:Unsafe d/c plan   Dispo: The patient is from: Home              Anticipated d/c is to: SNF              Patient currently is medically stable to d/c.  SNF placement pending   Difficult to place patient No   Antimicrobials:  Anti-infectives (From admission, onward)   None       Objective: Vitals:   08/12/20 1100 08/12/20 1430 08/12/20 2106 08/13/20 0500  BP: 140/69 (!) 148/91 (!) 146/78 (!) 167/87  Pulse: (!) 54 (!) 53 65 (!) 55  Resp: 18 17 20 17   Temp: 97.6 F (36.4 C) 97.7 F (36.5 C) 98.1 F (36.7 C) 97.9 F (36.6 C)  TempSrc: Oral Oral    SpO2: 99% 100% 98% 96%  Weight:      Height:        Intake/Output Summary (Last  24 hours) at 08/13/2020 1145 Last data filed at 08/13/2020 1007 Gross per 24 hour  Intake 937.51 ml  Output 550 ml  Net 387.51 ml   Filed Weights   08/11/20 1553  Weight: 74.8 kg    Examination: General exam: Appears calm and comfortable  Respiratory system: Clear to auscultation. Respiratory effort normal. Cardiovascular system: S1 & S2 heard, RRR. No pedal edema. Gastrointestinal system: Abdomen is nondistended, soft and nontender. Normal bowel sounds heard. Central nervous system: Alert, no focal deficits Extremities: Symmetric in appearance  bilaterally  Psychiatry: Judgement and insight appear poor, confused today   Data Reviewed: I have personally reviewed following labs and imaging studies  CBC: Recent Labs  Lab 08/11/20 1644 08/11/20 2315 08/12/20 0307  WBC 9.8 8.1 7.8  NEUTROABS 6.4  --   --   HGB 15.7 15.1 13.9  HCT 48.2 46.4 42.9  MCV 88.6 88.4 87.9  PLT 260 165 952   Basic Metabolic Panel: Recent Labs  Lab 08/11/20 1644 08/11/20 2315 08/12/20 0307  NA 137  --  138  K 3.7  --  3.8  CL 100  --  105  CO2 26  --  23  GLUCOSE 91  --  87  BUN 19  --  20  CREATININE 0.88 0.87 0.74  CALCIUM 9.4  --  8.9   GFR: Estimated Creatinine Clearance: 72.7 mL/min (by C-G formula based on SCr of 0.74 mg/dL). Liver Function Tests: Recent Labs  Lab 08/11/20 1644 08/12/20 0307  AST 24 22  ALT 12 18  ALKPHOS 80 70  BILITOT 0.9 0.9  PROT 8.2* 6.6  ALBUMIN 4.1 3.5   No results for input(s): LIPASE, AMYLASE in the last 168 hours. No results for input(s): AMMONIA in the last 168 hours. Coagulation Profile: No results for input(s): INR, PROTIME in the last 168 hours. Cardiac Enzymes: No results for input(s): CKTOTAL, CKMB, CKMBINDEX, TROPONINI in the last 168 hours. BNP (last 3 results) No results for input(s): PROBNP in the last 8760 hours. HbA1C: Recent Labs    08/11/20 2315  HGBA1C 5.6   CBG: Recent Labs  Lab 08/12/20 0752 08/12/20 1434 08/12/20 2112 08/13/20 0726 08/13/20 1129  GLUCAP 87 92 108* 79 94   Lipid Profile: No results for input(s): CHOL, HDL, LDLCALC, TRIG, CHOLHDL, LDLDIRECT in the last 72 hours. Thyroid Function Tests: No results for input(s): TSH, T4TOTAL, FREET4, T3FREE, THYROIDAB in the last 72 hours. Anemia Panel: No results for input(s): VITAMINB12, FOLATE, FERRITIN, TIBC, IRON, RETICCTPCT in the last 72 hours. Sepsis Labs: No results for input(s): PROCALCITON, LATICACIDVEN in the last 168 hours.  Recent Results (from the past 240 hour(s))  Resp Panel by RT-PCR (Flu A&B,  Covid) Nasopharyngeal Swab     Status: None   Collection Time: 08/11/20  9:48 PM   Specimen: Nasopharyngeal Swab; Nasopharyngeal(NP) swabs in vial transport medium  Result Value Ref Range Status   SARS Coronavirus 2 by RT PCR NEGATIVE NEGATIVE Final    Comment: (NOTE) SARS-CoV-2 target nucleic acids are NOT DETECTED.  The SARS-CoV-2 RNA is generally detectable in upper respiratory specimens during the acute phase of infection. The lowest concentration of SARS-CoV-2 viral copies this assay can detect is 138 copies/mL. A negative result does not preclude SARS-Cov-2 infection and should not be used as the sole basis for treatment or other patient management decisions. A negative result may occur with  improper specimen collection/handling, submission of specimen other than nasopharyngeal swab, presence of viral mutation(s) within the areas  targeted by this assay, and inadequate number of viral copies(<138 copies/mL). A negative result must be combined with clinical observations, patient history, and epidemiological information. The expected result is Negative.  Fact Sheet for Patients:  EntrepreneurPulse.com.au  Fact Sheet for Healthcare Providers:  IncredibleEmployment.be  This test is no t yet approved or cleared by the [place] FDA and  has been authorized for detection and/or diagnosis of SARS-CoV-2 by FDA under an Emergency Use Authorization (EUA). This EUA will remain  in effect (meaning this test can be used) for the duration of the COVID-19 declaration under Section 564(b)(1) of the Act, 21 U.S.C.section 360bbb-3(b)(1), unless the authorization is terminated  or revoked sooner.       Influenza A by PCR NEGATIVE NEGATIVE Final   Influenza B by PCR NEGATIVE NEGATIVE Final    Comment: (NOTE) The Xpert Xpress SARS-CoV-2/FLU/RSV plus assay is intended as an aid in the diagnosis of influenza from Nasopharyngeal swab specimens and should  not be used as a sole basis for treatment. Nasal washings and aspirates are unacceptable for Xpert Xpress SARS-CoV-2/FLU/RSV testing.  Fact Sheet for Patients: EntrepreneurPulse.com.au  Fact Sheet for Healthcare Providers: IncredibleEmployment.be  This test is not yet approved or cleared by the [place] FDA and has been authorized for detection and/or diagnosis of SARS-CoV-2 by FDA under an Emergency Use Authorization (EUA). This EUA will remain in effect (meaning this test can be used) for the duration of the COVID-19 declaration under Section 564(b)(1) of the Act, 21 U.S.C. section 360bbb-3(b)(1), unless the authorization is terminated or revoked.  Performed at [facility], [place] [address] [place], [place] [address]       Radiology Studies: DG Chest 2 View  Result Date: 08/11/2020 CLINICAL DATA:  [place] several days ago, back pain EXAM: CHEST - 2 VIEW COMPARISON:  03/07/2018 FINDINGS: Frontal and lateral views of the chest demonstrate an unremarkable cardiac silhouette. No airspace disease, effusion, or pneumothorax. Chronic midthoracic wedge compression deformity again noted. Invagination of the superior endplates in the upper and mid lumbar spine identified, age indeterminate. Please refer to separately reported lumbar spine series. IMPRESSION: 1. No acute intrathoracic process. 2. Chronic midthoracic wedge compression deformity. 3. Age indeterminate compression fractures lumbar spine, please refer to dedicated lumbar spine evaluation performed concurrently. Electronically Signed   By: Randa Ngo M.D.   On: 08/11/2020 18:49   DG Lumbar Spine Complete  Result Date: 08/11/2020 CLINICAL DATA:  [place] several days ago, inability to bear weight, right hip pain EXAM: LUMBAR SPINE - COMPLETE 4+ VIEW COMPARISON:  03/11/2018 FINDINGS: Frontal, bilateral oblique, lateral views of the lumbar spine are obtained. There are 5  non-rib-bearing lumbar type vertebral bodies, with hemi sacralization of the L5 vertebral body on the right. There is anterior wedge compression deformity of the L5 vertebral body, with approximately 50% loss of height. No retropulsion. This is age indeterminate, but new since 2019. Mild invagination of the superior endplates of L2, L3, and L4 age indeterminate but likely chronic. Prominent left convex scoliosis centered at L3. Severe facet hypertrophy throughout the entire lumbar spine, greatest at the lumbosacral junction. Sacroiliac joints are unremarkable. IMPRESSION: 1. Anterior wedge compression deformity of the L5 vertebral body, with approximately 50% loss of height. This is new since 2019, but otherwise age indeterminate. 2. Chronic appearing invagination of the superior endplates of L2, L3, and L4. Electronically Signed   By: Randa Ngo M.D.   On: 08/11/2020 18:55   CT Head Wo Contrast  Result  Date: 08/11/2020 CLINICAL DATA:  [place] several days ago EXAM: CT HEAD WITHOUT CONTRAST TECHNIQUE: Contiguous axial images were obtained from the base of the skull through the vertex without intravenous contrast. COMPARISON:  03/05/2018 FINDINGS: Brain: Confluent hypodensities throughout the periventricular white matter are compatible with chronic small vessel ischemic changes. No signs of acute infarct or hemorrhage. No acute extra-axial fluid collections. No mass effect. Vascular: Calcified supraclinoid right ICA aneurysm measures 2.5 x 1.6 cm in transverse dimension unchanged since prior study. No hyperdense vessel. Skull: Postsurgical changes from previous right frontotemporal craniotomy. No acute bony abnormalities. Sinuses/Orbits: No acute finding. Other: None. IMPRESSION: 1. Stable chronic small-vessel ischemic changes. No acute intracranial process. 2. Stable calcified supraclinoid right ICA aneurysm. Electronically Signed   By: Randa Ngo M.D.   On: 08/11/2020 18:45   DG Hip Unilat W or Wo Pelvis  2-3 Views Right  Result Date: 08/11/2020 CLINICAL DATA:  [place] several days ago, inability to bear weight, right hip pain EXAM: DG HIP (WITH OR WITHOUT PELVIS) 2-3V RIGHT COMPARISON:  08/20/2018 FINDINGS: Frontal view of the pelvis as well as frontal and frogleg lateral views of the right hip are obtained. No acute fracture, subluxation, or dislocation. Stable mild bilateral hip osteoarthritis. Sacroiliac joints are unremarkable. IMPRESSION: 1. No acute displaced fracture. 2. Stable osteoarthritis. Electronically Signed   By: Randa Ngo M.D.   On: 08/11/2020 18:51      Scheduled Meds: . amantadine  100 mg Oral Daily  . carbidopa-levodopa  1 tablet Oral BID  . enoxaparin (LOVENOX) injection  40 mg Subcutaneous Q24H  . insulin aspart  0-5 Units Subcutaneous QHS  . insulin aspart  0-9 Units Subcutaneous TID WC  . lacosamide  100 mg Oral BID  . levETIRAcetam  1,500 mg Oral BID  . levothyroxine  75 mcg Oral Q0600  . mouth rinse  15 mL Mouth Rinse BID  . vitamin B-12  2,000 mcg Oral QHS   Continuous Infusions:    LOS: 1 day      Time spent: 20 minutes   Dessa Phi, DO [facility] 08/13/2020, 11:45 AM   Available via Epic secure chat 7am-7pm After these hours, please refer to coverage provider listed on [email or web address]

## 2020-08-13 NOTE — Evaluation (Addendum)
Occupational Therapy Evaluation Patient Details Name: Gerald Leach MRN: [id number] DOB: Oct 18, 1933 Today's Date: 08/13/2020    History of Present Illness Gerald Leach is an 87 y.o. male with medical history significant of dementia, diabetes, hypertension, hyperlipidemia, brain aneurysm with previous bleeds status post 2 surgeries in the past, Parkinson's disease, seizure disorder, gait abnormalities with fall with brought in by family today due to worsening falls at home.Work up in the ER showed L5 compression fracture. Discussion with neurosurgery regarding his compression fracture.  Recommendation is supportive care.   Clinical Impression   Gerald Leach is an 87 year old man with above medical history who presents pleasant and predominantly alert and oriented supine in bed. He presents with generalized weakness, decreased activity tolerance, impaired balance and pain. Patient min assist to roll to the right and mod assist to transfer into sitting. Patient needed assistance to don back brace and patient states "I wore that yesterday." Patient mod assist to stand due to posterior lean and min assist to ambulate with RW. Patient exhibits a festinating gait, kyphotic posture and posterior bias that therapist had to correct. Patient able to walk to sink and wash hands and face - with therapist stabilizing patient. With taking steps backwards to recliner patient had posterior loss of balance and unable to control descent. Patient needing significant assistance for LB ADLs due to stiffness of joint and impaired balance.Therapist repositioned patient and had him stand and ambulate forward in room to work on balance, use of rolling walker (uses rollator), and taking larger steps. Therapist needed to push walker forward to keep patient from festinating. Patient will benefit from skilled OT services while in hospital to improve deficits and learn compensatory strategies as needed in order to return to  [facility].       Follow Up Recommendations  SNF    Equipment Recommendations  None recommended by OT    Recommendations for Other Services       Precautions / Restrictions Precautions Precautions: Fall Required Braces or Orthoses: Spinal Brace Spinal Brace: Applied in sitting position Restrictions Weight Bearing Restrictions: No      Mobility Bed Mobility Overal bed mobility: Needs Assistance Bed Mobility: Supine to Sit Rolling: Min assist Sidelying to sit: Mod assist;HOB elevated       General bed mobility comments: Min assist to get patient over into full sidelying, mod assist to get lower extremities off of bed and trunk lift off.    Transfers Overall transfer level: Needs assistance Equipment used: Rolling walker (2 wheeled) Transfers: Sit to/from [facility] Sit to Stand: Mod assist;From elevated surface;+2 safety/equipment Stand pivot transfers: Min assist;+2 safety/equipment       General transfer comment: Mod assist to stand and physical assistance to get patient forward out of posterior lean. Min assist to steady and manage RW to take steps to sink and ambulate forward in room. Taking steps backward resulting in patient exhibiting loss of balance posteriorly and unable to controll descent (therapist did).    Balance Overall balance assessment: Needs assistance Sitting-balance support: No upper extremity supported;Feet supported Sitting balance-Leahy Scale: Fair Sitting balance - Comments: initially posterior lean, gradually able to  gain balance and sat near midline with min guard. Postural control: Posterior lean Standing balance support: During functional activity;Bilateral upper extremity supported Standing balance-Leahy Scale: Poor Standing balance comment: reliant on RW and support for posterior bias  ADL either performed or assessed with clinical judgement   ADL Overall ADL's : Needs  assistance/impaired Eating/Feeding: Set up;Sitting   Grooming: Set up;Sitting   Upper Body Bathing: Set up;Sitting;Cueing for sequencing   Lower Body Bathing: Moderate assistance;Set up;Sit to/from stand   Upper Body Dressing : Set up;Sitting   Lower Body Dressing: Maximal assistance;Sit to/from stand;+2 for safety/equipment   Toilet Transfer: Moderate assistance;+2 for safety/equipment;BSC;RW;Stand-pivot   Toileting- Clothing Manipulation and Hygiene: Total assistance;Sit to/from stand;+2 for safety/equipment       Functional mobility during ADLs: Minimal assistance;Rolling walker       Vision Patient Visual Report: No change from baseline       Perception     Praxis      Pertinent Vitals/Pain Pain Assessment: Faces Faces Pain Scale: Hurts little more Pain Location: right posterior hip and back of thigh Pain Descriptors / Indicators: Discomfort;Grimacing Pain Intervention(s): Limited activity within patient's tolerance     Hand Dominance Right   Extremity/Trunk Assessment Upper Extremity Assessment Upper Extremity Assessment: Generalized weakness;RUE deficits/detail;LUE deficits/detail RUE Deficits / Details: 3+/5 shoulder strength, 4/5 elbow, 5/5 wrist, 4/5 grip LUE Deficits / Details: 4-/5 shoulder, 4/5 elbow, 5/5 wrist , 4/5 grip   Lower Extremity Assessment Lower Extremity Assessment: Defer to PT evaluation   Cervical / Trunk Assessment Cervical / Trunk Assessment: Kyphotic   Communication Communication Communication: HOH   Cognition Arousal/Alertness: Awake/alert Behavior During Therapy: WFL for tasks assessed/performed Overall Cognitive Status: Within Functional Limits for tasks assessed                                     General Comments       Exercises     Shoulder Instructions      Home Living Family/patient expects to be discharged to:: Private residence Living Arrangements: Alone Available Help at Discharge: Available  PRN/intermittently Type of Home: House Home Access: Stairs to enter Technical brewer of Steps: 3-4 Entrance Stairs-Rails:  (unsure) Home Layout: Multi-level;Able to live on main level with bedroom/bathroom     Bathroom Shower/Tub: Tub/shower unit         Home Equipment: Walker - 4 wheels;Grab bars - tub/shower          Prior Functioning/Environment Level of Independence: Independent with assistive device(s)        Comments: Family gets groceries, patient independnet in basic ADL's, able to negotiate steps until recent falls.        OT Problem List: Decreased strength;Decreased activity tolerance;Impaired balance (sitting and/or standing);Decreased cognition;Decreased safety awareness;Decreased knowledge of use of DME or AE;Decreased knowledge of precautions;Pain      OT Treatment/Interventions: Self-care/ADL training;DME and/or AE instruction;Therapeutic activities;Balance training;Patient/family education    OT Goals(Current goals can be found in the care plan section) Acute Rehab OT Goals Patient Stated Goal: walk better OT Goal Formulation: With patient Time For Goal Achievement: 08/27/20 Potential to Achieve Goals: Good  OT Frequency: Min 2X/week   Barriers to D/C:            Co-evaluation              AM-PAC OT "6 Clicks" Daily Activity     Outcome Measure Help from another person eating meals?: A Little Help from another person taking care of personal grooming?: A Little Help from another person toileting, which includes using toliet, bedpan, or urinal?: Total Help from another person bathing (including washing, rinsing, drying)?: A  Lot Help from another person to put on and taking off regular upper body clothing?: A Little Help from another person to put on and taking off regular lower body clothing?: Total 6 Click Score: 13   End of Session Equipment Utilized During Treatment: Gait belt;Rolling walker Nurse Communication: Mobility  status  Activity Tolerance: Patient tolerated treatment well Patient left: in chair;with call bell/phone within reach;with chair alarm set  OT Visit Diagnosis: Unsteadiness on feet (R26.81);Other abnormalities of gait and mobility (R26.89);History of falling (Z91.81);Muscle weakness (generalized) (M62.81);Pain Pain - Right/Left: Right Pain - part of body: Hip;Leg                Time: 1310-1336 OT Time Calculation (min): 26 min Charges:  OT General Charges $OT Visit: 1 Visit OT Evaluation $OT Eval Moderate Complexity: 1 Mod OT Treatments $Therapeutic Activity: 8-22 mins  Reis Pienta, OTR/L [place] [phone] Pager: [place] 08/13/2020, 4:07 PM

## 2020-08-13 NOTE — Evaluation (Addendum)
Clinical/Bedside Swallow Evaluation Patient Details  Name: Gerald Leach MRN: [id number] Date of Birth: Jan 27, 1934  Today's Date: 08/13/2020 Time: SLP Start Time (ACUTE ONLY): 1620 SLP Stop Time (ACUTE ONLY): 1637 SLP Time Calculation (min) (ACUTE ONLY): 17 min  Past Medical History:  Past Medical History:  Diagnosis Date  . Brain aneurysm   . Colonic polyp 2003  . Dementia ([place])   . Diabetes mellitus without complication ([place])   . ED (erectile dysfunction)   . GERD (gastroesophageal reflux disease)   . Hyperlipidemia   . Hypertension   . Hypothyroidism   . Seizures ([place])    per family  . Seizures ([place])    Past Surgical History:  Past Surgical History:  Procedure Laterality Date  . Aneurysmal clipping    . brain aneurysm surgery     at [facility]  . carotid artery aneurysm    . COLONOSCOPY     polyps  . CRANIOTOMY Right 09/19/2012   Procedure: CRANIOTOMY HEMATOMA EVACUATION SUBDURAL;  Surgeon: Otilio Connors, MD;  Location: [place] [facility];  Service: Neurosurgery;  Laterality: Right;  . CRANIOTOMY Right 09/22/2012   Procedure: CRANIOTOMY HEMATOMA EVACUATION SUBDURAL;  Surgeon: Otilio Connors, MD;  Location: [place] [facility];  Service: Neurosurgery;  Laterality: Right;  . Craniotomy.    . right hernia     HPI:  Gerald Leach is a 87 y.o. male with medical history significant of dementia, diabetes, hypertension, hyperlipidemia, brain aneurysm with previous bleeds status post 2 surgeries in the past, Parkinson's disease, seizure disorder, gait abnormalities with fall with brought in by family due to worsening falls at home. Evaluation in the ER showed L5 compression fracture.  Head CT and CXR were unremarkable.   Assessment / Plan / Recommendation Clinical Impression  Pt was seen for a bedside swallow evaluation in the setting of difficulty consuming medication.  Pt was encountered awake/alert in bed with son present at bedside.  Pt and son stated that the pt has been tolerating regular  solids and thin liquids without difficulty, except for large pills.  RN stated that administering medications whole in applesauce helped the patient.  Oral mechanism examination was unremarkable.  Pt consumed thin liquid, puree, and regular solids without overt s/sx of aspiration.  Mastication of regular solids was mildly prolonged, but it was effective and no oral residue was observed.  Recommend continuation of regular solids and thin liquids with medication administered whole in puree.  No further skilled ST is warranted at this time targeting dysphagia, please re-consult if additional needs arise.   Of note, patient's son requested a cognitive-linguistic evaluation in the setting of cognitive changes.  SLP notified MD.    SLP Visit Diagnosis: Dysphagia, unspecified (R13.10)    Aspiration Risk  Mild aspiration risk    Diet Recommendation Regular;Thin liquid   Liquid Administration via: Cup;Straw Medication Administration: Whole meds with puree Supervision: Patient able to self feed;Intermittent supervision to cue for compensatory strategies Compensations: Minimize environmental distractions;Slow rate;Small sips/bites Postural Changes: Seated upright at 90 degrees    Other  Recommendations Oral Care Recommendations: Oral care BID   Follow up Recommendations None      Frequency and Duration min 2x/week  2 weeks       Prognosis        Swallow Study   General HPI: Gerald Leach is a 87 y.o. male with medical history significant of dementia, diabetes, hypertension, hyperlipidemia, brain aneurysm with previous bleeds status post 2 surgeries in the  past, Parkinson's disease, seizure disorder, gait abnormalities with fall with brought in by family due to worsening falls at home. Evaluation in the ER showed L5 compression fracture.  Head CT and CXR were unremarkable. Type of Study: Bedside Swallow Evaluation Previous Swallow Assessment: BSE 10/03/14 Diet Prior to this Study: Regular;Thin  liquids Temperature Spikes Noted: No Respiratory Status: Room air History of Recent Intubation: No Behavior/Cognition: Alert;Cooperative;Confused;Pleasant mood Oral Cavity Assessment: Within Functional Limits Oral Care Completed by SLP: No Oral Cavity - Dentition: Adequate natural dentition Vision: Functional for self-feeding Self-Feeding Abilities: Able to feed self Patient Positioning: Upright in bed Baseline Vocal Quality: Normal Volitional Swallow: Able to elicit    Oral/Motor/Sensory Function Overall Oral Motor/Sensory Function: Within functional limits   Ice Chips Ice chips: Not tested   Thin Liquid Thin Liquid: Within functional limits    Nectar Thick Nectar Thick Liquid: Not tested   Honey Thick Honey Thick Liquid: Not tested   Puree Puree: Within functional limits   Solid     Solid: Within functional limits Presentation: [place]., M.S., [place] Office: [phone]  Elvia Collum Lucille Witts 08/13/2020,4:41 PM

## 2020-08-14 DIAGNOSIS — E119 Type 2 diabetes mellitus without complications: Secondary | ICD-10-CM | POA: Diagnosis not present

## 2020-08-14 DIAGNOSIS — W19XXXA Unspecified fall, initial encounter: Secondary | ICD-10-CM | POA: Diagnosis not present

## 2020-08-14 LAB — GLUCOSE, CAPILLARY
Glucose-Capillary: 89 mg/dL (ref 70–99)
Glucose-Capillary: 91 mg/dL (ref 70–99)
Glucose-Capillary: 80 mg/dL (ref 70–99)
Glucose-Capillary: 95 mg/dL (ref 70–99)

## 2020-08-14 NOTE — Progress Notes (Signed)
PROGRESS NOTE    Gerald Leach  QIW:[id number] DOB: [birth year] DOA: 08/11/2020 PCP: [name], NP     Brief Narrative:  Gerald Leach is an 87 y.o. male with medical history significant of dementia, diabetes, hypertension, hyperlipidemia, brain aneurysm with previous bleeds status post 2 surgeries in the past, Parkinson's disease, seizure disorder, gait abnormalities with fall with brought in by family today due to worsening falls at home.  Patient has been more progressively weak.  He has had at least 4 falls in the last 3 days.  He is unable to stay alone.  Most of the time cannot get up and has to stay down for a while until a family member arrives.  Patient is unable to take care of himself. Work up in the ER showed L5 compression fracture. Discussion with neurosurgery regarding his compression fracture.  Recommendation is supportive care.    Patient was evaluated by physical therapy who recommended SNF placement.  New events last 24 hours / Subjective: Patient sleeping this morning, opens eyes to verbal stimuli but does not want to engage or interact with me. Per report, patient has had episodes of confusion, remains weak. SNF recommended.   Assessment & Plan:   Principal Problem:   Fall Active Problems:   GERD   Diabetes mellitus type 2, controlled ([place])   Hypothyroidism   Parkinson disease ([place])   Gait instability   Seizures ([facility])   Closed compression fracture of L5 lumbar vertebra, initial encounter ([place])   Recurrent falls with gait abnormality, history of vascular parkinsonism, history of subdural hematoma s/p craniotomy in 2014  -Concern for return back to his home setting, lives at home alone and has had multiple falls  -Continue sinemet  -PT recommending SNF placement  L5 compression fracture -Lumbar spine xray: Anterior wedge compression deformity of the L5 vertebral body, with approximately 50% loss of height. This is new since 2019, but otherwise age  indeterminate. Chronic appearing invagination of the superior endplates of L2, L3, and L4. -Neurosurgery has recommended supportive care, brace  Diabetes -Ha1c 5.6  -Sliding-scale insulin  Hypothyroidism  -Continue synthroid   Generalized seizure disorder due to subdural hematoma 09/2012 -Follows with Dr. Narda [facility] -Continue Keppra and vimpat   Multifactorial dementia, moderate -Continue amantidine   DVT prophylaxis:  enoxaparin (LOVENOX) injection 40 mg Start: 08/11/20 2345  Code Status: Full code Family Communication: No family at bedside Disposition Plan:  Status is: Inpatient  Remains inpatient appropriate because:Unsafe d/c plan   Dispo: The patient is from: Home              Anticipated d/c is to: SNF              Patient currently is medically stable to d/c.  SNF placement pending   Difficult to place patient No   Antimicrobials:  Anti-infectives (From admission, onward)   None       Objective: Vitals:   08/13/20 0500 08/13/20 1418 08/13/20 2040 08/14/20 0633  BP: (!) 167/87 (!) 151/90 (!) 144/74 (!) 155/84  Pulse: (!) 55 (!) 56 (!) 47 (!) 51  Resp: 17 17 17 18   Temp: 97.9 F (36.6 C) 97.6 F (36.4 C) 98.4 F (36.9 C) 98.7 F (37.1 C)  TempSrc:  Oral    SpO2: 96% 97% 99% 99%  Weight:      Height:        Intake/Output Summary (Last 24 hours) at 08/14/2020 0853 Last data filed at 08/14/2020 4174 Gross  per 24 hour  Intake 1090 ml  Output 1300 ml  Net -210 ml   Filed Weights   08/11/20 1553  Weight: 74.8 kg   Examination: General exam: Appears calm and comfortable    Data Reviewed: I have personally reviewed following labs and imaging studies  CBC: Recent Labs  Lab 08/11/20 1644 08/11/20 2315 08/12/20 0307  WBC 9.8 8.1 7.8  NEUTROABS 6.4  --   --   HGB 15.7 15.1 13.9  HCT 48.2 46.4 42.9  MCV 88.6 88.4 87.9  PLT 260 165 097   Basic Metabolic Panel: Recent Labs  Lab 08/11/20 1644 08/11/20 2315 08/12/20 0307  NA  137  --  138  K 3.7  --  3.8  CL 100  --  105  CO2 26  --  23  GLUCOSE 91  --  87  BUN 19  --  20  CREATININE 0.88 0.87 0.74  CALCIUM 9.4  --  8.9   GFR: Estimated Creatinine Clearance: 72.7 mL/min (by C-G formula based on SCr of 0.74 mg/dL). Liver Function Tests: Recent Labs  Lab 08/11/20 1644 08/12/20 0307  AST 24 22  ALT 12 18  ALKPHOS 80 70  BILITOT 0.9 0.9  PROT 8.2* 6.6  ALBUMIN 4.1 3.5   No results for input(s): LIPASE, AMYLASE in the last 168 hours. No results for input(s): AMMONIA in the last 168 hours. Coagulation Profile: No results for input(s): INR, PROTIME in the last 168 hours. Cardiac Enzymes: No results for input(s): CKTOTAL, CKMB, CKMBINDEX, TROPONINI in the last 168 hours. BNP (last 3 results) No results for input(s): PROBNP in the last 8760 hours. HbA1C: Recent Labs    08/11/20 2315  HGBA1C 5.6   CBG: Recent Labs  Lab 08/13/20 0726 08/13/20 1129 08/13/20 1617 08/13/20 2058 08/14/20 0740  GLUCAP 79 94 74 146* 89   Lipid Profile: No results for input(s): CHOL, HDL, LDLCALC, TRIG, CHOLHDL, LDLDIRECT in the last 72 hours. Thyroid Function Tests: No results for input(s): TSH, T4TOTAL, FREET4, T3FREE, THYROIDAB in the last 72 hours. Anemia Panel: No results for input(s): VITAMINB12, FOLATE, FERRITIN, TIBC, IRON, RETICCTPCT in the last 72 hours. Sepsis Labs: No results for input(s): PROCALCITON, LATICACIDVEN in the last 168 hours.  Recent Results (from the past 240 hour(s))  Resp Panel by RT-PCR (Flu A&B, Covid) Nasopharyngeal Swab     Status: None   Collection Time: 08/11/20  9:48 PM   Specimen: Nasopharyngeal Swab; Nasopharyngeal(NP) swabs in vial transport medium  Result Value Ref Range Status   SARS Coronavirus 2 by RT PCR NEGATIVE NEGATIVE Final    Comment: (NOTE) SARS-CoV-2 target nucleic acids are NOT DETECTED.  The SARS-CoV-2 RNA is generally detectable in upper respiratory specimens during the acute phase of infection. The  lowest concentration of SARS-CoV-2 viral copies this assay can detect is 138 copies/mL. A negative result does not preclude SARS-Cov-2 infection and should not be used as the sole basis for treatment or other patient management decisions. A negative result may occur with  improper specimen collection/handling, submission of specimen other than nasopharyngeal swab, presence of viral mutation(s) within the areas targeted by this assay, and inadequate number of viral copies(<138 copies/mL). A negative result must be combined with clinical observations, patient history, and epidemiological information. The expected result is Negative.  Fact Sheet for Patients:  EntrepreneurPulse.com.au  Fact Sheet for Healthcare Providers:  IncredibleEmployment.be  This test is no t yet approved or cleared by the [place] FDA and  has been  authorized for detection and/or diagnosis of SARS-CoV-2 by FDA under an Emergency Use Authorization (EUA). This EUA will remain  in effect (meaning this test can be used) for the duration of the COVID-19 declaration under Section 564(b)(1) of the Act, 21 U.S.C.section 360bbb-3(b)(1), unless the authorization is terminated  or revoked sooner.       Influenza A by PCR NEGATIVE NEGATIVE Final   Influenza B by PCR NEGATIVE NEGATIVE Final    Comment: (NOTE) The Xpert Xpress SARS-CoV-2/FLU/RSV plus assay is intended as an aid in the diagnosis of influenza from Nasopharyngeal swab specimens and should not be used as a sole basis for treatment. Nasal washings and aspirates are unacceptable for Xpert Xpress SARS-CoV-2/FLU/RSV testing.  Fact Sheet for Patients: EntrepreneurPulse.com.au  Fact Sheet for Healthcare Providers: IncredibleEmployment.be  This test is not yet approved or cleared by the [place] FDA and has been authorized for detection and/or diagnosis of SARS-CoV-2 by FDA under  an Emergency Use Authorization (EUA). This EUA will remain in effect (meaning this test can be used) for the duration of the COVID-19 declaration under Section 564(b)(1) of the Act, 21 U.S.C. section 360bbb-3(b)(1), unless the authorization is terminated or revoked.  Performed at [facility], [place] [address] [place],  [address]       Radiology Studies: No results found.    Scheduled Meds: . amantadine  100 mg Oral Daily  . carbidopa-levodopa  1 tablet Oral BID  . enoxaparin (LOVENOX) injection  40 mg Subcutaneous Q24H  . insulin aspart  0-5 Units Subcutaneous QHS  . insulin aspart  0-9 Units Subcutaneous TID WC  . lacosamide  100 mg Oral BID  . levETIRAcetam  1,500 mg Oral BID  . levothyroxine  75 mcg Oral Q0600  . mouth rinse  15 mL Mouth Rinse BID  . vitamin B-12  2,000 mcg Oral QHS   Continuous Infusions:    LOS: 1 day      Time spent: 10 minutes   Dessa Phi, DO [facility] 08/14/2020, 8:53 AM   Available via Epic secure chat 7am-7pm After these hours, please refer to coverage provider listed on [email or web address]

## 2020-08-14 NOTE — Plan of Care (Signed)
  Problem: Clinical Measurements: Goal: Respiratory complications will improve Outcome: Progressing   Problem: Clinical Measurements: Goal: Cardiovascular complication will be avoided Outcome: Progressing   Problem: Clinical Measurements: Goal: Diagnostic test results will improve Outcome: Progressing   Problem: Elimination: Goal: Will not experience complications related to bowel motility Outcome: Progressing   Problem: Elimination: Goal: Will not experience complications related to urinary retention Outcome: Progressing

## 2020-08-15 DIAGNOSIS — W19XXXA Unspecified fall, initial encounter: Secondary | ICD-10-CM | POA: Diagnosis not present

## 2020-08-15 DIAGNOSIS — E119 Type 2 diabetes mellitus without complications: Secondary | ICD-10-CM | POA: Diagnosis not present

## 2020-08-15 LAB — GLUCOSE, CAPILLARY
Glucose-Capillary: 114 mg/dL — ABNORMAL HIGH (ref 70–99)
Glucose-Capillary: 153 mg/dL — ABNORMAL HIGH (ref 70–99)
Glucose-Capillary: 80 mg/dL (ref 70–99)
Glucose-Capillary: 115 mg/dL — ABNORMAL HIGH (ref 70–99)

## 2020-08-15 NOTE — Progress Notes (Signed)
Physical Therapy Treatment Patient Details Name: Gerald Leach MRN: [id number] DOB: 1934-06-16 Today's Date: 08/15/2020    History of Present Illness Gerald Leach is an 87 y.o. male with medical history significant of dementia, diabetes, hypertension, hyperlipidemia, brain aneurysm with previous bleeds status post 2 surgeries in the past, Parkinson's disease, seizure disorder, gait abnormalities with fall with brought in by family today due to worsening falls at home.Work up in the ER showed L5 compression fracture. Discussion with neurosurgery regarding his compression fracture.  Recommendation is supportive care.    PT Comments    Pt OOB in recliner.  Assisted with applying back brace.  Educated on use.  "for comfort".  Assisted out of recliner. General transfer comment: Mod Assist to rise with 75% VC's on proper hand placement and safety with turns.  Also assisted with a toilet transfer.  Increased assist with controlled stand to sit.  General Gait Details: 25% VC's on "stepping" as pt has Hx Parkinsons, festination and freezing at times esp thru doorways and tight spaces.  Assisted with amb pt to bathroom then in hallway with recliner following. Positioned in recliner for comfort.   Follow Up Recommendations  SNF     Equipment Recommendations  None recommended by PT    Recommendations for Other Services       Precautions / Restrictions Precautions Precautions: Fall Precaution Comments: Parkinsons Required Braces or Orthoses: Spinal Brace Spinal Brace: Applied in sitting position    Mobility  Bed Mobility               General bed mobility comments: Pt OOB in recliner    Transfers Overall transfer level: Needs assistance Equipment used: Rolling walker (2 wheeled) Transfers: Sit to/from [facility] Sit to Stand: Mod assist;From elevated surface;+2 safety/equipment Stand pivot transfers: +2 safety/equipment;Mod assist;Max assist       General  transfer comment: Mod Assist to rise with 75% VC's on proper hand placement and safety with turns.  Also assisted with a toilet transfer.  Increased assist with controlled stand to sit.  Ambulation/Gait Ambulation/Gait assistance: Mod assist;Max assist;+2 physical assistance Gait Distance (Feet): 24 Feet Assistive device: Rolling walker (2 wheeled) Gait Pattern/deviations: Step-to pattern;Festinating;Shuffle;Decreased step length - right;Decreased step length - left Gait velocity: decreased   General Gait Details: 25% VC's on "stepping" as pt has Hx Parkinsons, festination and freezing at times esp thru doorways and tight spaces.  Assisted with amb pt to bathroom then in hallway with recliner following.   Stairs             Wheelchair Mobility    Modified Rankin (Stroke Patients Only)       Balance                                            Cognition Arousal/Alertness: Awake/alert Behavior During Therapy: WFL for tasks assessed/performed Overall Cognitive Status: Difficult to assess                                 General Comments: AxO x 2 pleasant and following all directions      Exercises      General Comments        Pertinent Vitals/Pain Pain Assessment: Faces Pain Score: 0-No pain Faces Pain Scale: Hurts little more Pain Location: right posterior hip  with amb Pain Descriptors / Indicators: Discomfort;Grimacing Pain Intervention(s): Monitored during session    Home Living     Available Help at Discharge: Available PRN/intermittently Type of Home: House              Prior Function            PT Goals (current goals can now be found in the care plan section) Progress towards PT goals: Progressing toward goals    Frequency    Min 2X/week      PT Plan Current plan remains appropriate    Co-evaluation              AM-PAC PT "6 Clicks" Mobility   Outcome Measure  Help needed turning from your  back to your side while in a flat bed without using bedrails?: A Lot Help needed moving from lying on your back to sitting on the side of a flat bed without using bedrails?: A Lot Help needed moving to and from a bed to a chair (including a wheelchair)?: A Lot Help needed standing up from a chair using your arms (e.g., wheelchair or bedside chair)?: A Lot Help needed to walk in hospital room?: A Lot Help needed climbing 3-5 steps with a railing? : Total 6 Click Score: 11    End of Session Equipment Utilized During Treatment: Gait belt;Back brace Activity Tolerance: Patient tolerated treatment well Patient left: in chair;with call bell/phone within reach;with chair alarm set Nurse Communication: Mobility status PT Visit Diagnosis: Unsteadiness on feet (R26.81);Muscle weakness (generalized) (M62.81);Difficulty in walking, not elsewhere classified (R26.2);Repeated falls (R29.6)     Time: 6644-0347 PT Time Calculation (min) (ACUTE ONLY): 28 min  Charges:  $Gait Training: 8-22 mins $Therapeutic Activity: 8-22 mins                     Rica Koyanagi  PTA  Acute  Rehabilitation Services Pager      [phone] Office      [phone]

## 2020-08-15 NOTE — Progress Notes (Signed)
PROGRESS NOTE    Gerald Leach  CZY:[id number] DOB: [birth year] DOA: 08/11/2020 PCP: [name], NP     Brief Narrative:  Gerald Leach is an 87 y.o. male with medical history significant of dementia, diabetes, hypertension, hyperlipidemia, brain aneurysm with previous bleeds status post 2 surgeries in the past, Parkinson's disease, seizure disorder, gait abnormalities with fall with brought in by family today due to worsening falls at home.  Patient has been more progressively weak.  He has had at least 4 falls in the last 3 days.  He is unable to stay alone.  Most of the time cannot get up and has to stay down for a while until a family member arrives.  Patient is unable to take care of himself. Work up in the ER showed L5 compression fracture. Discussion with neurosurgery regarding his compression fracture.  Recommendation is supportive care.    Patient was evaluated by physical therapy who recommended SNF placement.  New events last 24 hours / Subjective: Patient sitting in a recliner, states that he has no back pain as long as he is laying still.  No new complaints today.  Discussed SNF placement.  Assessment & Plan:   Principal Problem:   Fall Active Problems:   GERD   Diabetes mellitus type 2, controlled ([place])   Hypothyroidism   Parkinson disease ([place])   Gait instability   Seizures (HCC)   Closed compression fracture of L5 lumbar vertebra, initial encounter ([place])   Recurrent falls with gait abnormality, history of vascular parkinsonism, history of subdural hematoma s/p craniotomy in 2014  -Concern for return back to his home setting, lives at home alone and has had multiple falls  -Continue sinemet  -PT recommending SNF placement  L5 compression fracture -Lumbar spine xray: Anterior wedge compression deformity of the L5 vertebral body, with approximately 50% loss of height. This is new since 2019, but otherwise age indeterminate. Chronic appearing invagination of the  superior endplates of L2, L3, and L4. -Neurosurgery has recommended supportive care, brace  Diabetes -Ha1c 5.6  -Sliding-scale insulin  Hypothyroidism  -Continue synthroid   Generalized seizure disorder due to subdural hematoma 09/2012 -Follows with Dr. Narda [facility] -Continue Keppra and vimpat   Multifactorial dementia, moderate -Continue amantidine   DVT prophylaxis:  enoxaparin (LOVENOX) injection 40 mg Start: 08/11/20 2345  Code Status: Full code Family Communication: No family at bedside Disposition Plan:  Status is: Inpatient  Remains inpatient appropriate because:Unsafe d/c plan   Dispo: The patient is from: Home              Anticipated d/c is to: SNF              Patient currently is medically stable to d/c.  SNF placement pending   Difficult to place patient No   Antimicrobials:  Anti-infectives (From admission, onward)   None       Objective: Vitals:   08/14/20 0633 08/14/20 1326 08/14/20 2102 08/15/20 0554  BP: (!) 155/84 122/69 132/75 (!) 161/90  Pulse: (!) 51 (!) 50 (!) 56 (!) 58  Resp: 18 16 18 17   Temp: 98.7 F (37.1 C) 97.9 F (36.6 C) 98.7 F (37.1 C) 98.9 F (37.2 C)  TempSrc:    Oral  SpO2: 99% 100% 99% 100%  Weight:      Height:        Intake/Output Summary (Last 24 hours) at 08/15/2020 1048 Last data filed at 08/15/2020 0934 Gross per 24 hour  Intake 1520  ml  Output 300 ml  Net 1220 ml   Filed Weights   08/11/20 1553  Weight: 74.8 kg   Examination: General exam: Appears calm and comfortable  Respiratory system: Clear to auscultation. Respiratory effort normal. Cardiovascular system: S1 & S2 heard, RRR. No pedal edema. Gastrointestinal system: Abdomen is nondistended, soft and nontender. Normal bowel sounds heard. Central nervous system: Alert and oriented. Non focal exam. Speech clear  Extremities: Symmetric in appearance bilaterally  Skin: No rashes, lesions or ulcers on exposed skin  Psychiatry: Mood & affect  appropriate.   Data Reviewed: I have personally reviewed following labs and imaging studies  CBC: Recent Labs  Lab 08/11/20 1644 08/11/20 2315 08/12/20 0307  WBC 9.8 8.1 7.8  NEUTROABS 6.4  --   --   HGB 15.7 15.1 13.9  HCT 48.2 46.4 42.9  MCV 88.6 88.4 87.9  PLT 260 165 130   Basic Metabolic Panel: Recent Labs  Lab 08/11/20 1644 08/11/20 2315 08/12/20 0307  NA 137  --  138  K 3.7  --  3.8  CL 100  --  105  CO2 26  --  23  GLUCOSE 91  --  87  BUN 19  --  20  CREATININE 0.88 0.87 0.74  CALCIUM 9.4  --  8.9   GFR: Estimated Creatinine Clearance: 72.7 mL/min (by C-G formula based on SCr of 0.74 mg/dL). Liver Function Tests: Recent Labs  Lab 08/11/20 1644 08/12/20 0307  AST 24 22  ALT 12 18  ALKPHOS 80 70  BILITOT 0.9 0.9  PROT 8.2* 6.6  ALBUMIN 4.1 3.5   No results for input(s): LIPASE, AMYLASE in the last 168 hours. No results for input(s): AMMONIA in the last 168 hours. Coagulation Profile: No results for input(s): INR, PROTIME in the last 168 hours. Cardiac Enzymes: No results for input(s): CKTOTAL, CKMB, CKMBINDEX, TROPONINI in the last 168 hours. BNP (last 3 results) No results for input(s): PROBNP in the last 8760 hours. HbA1C: No results for input(s): HGBA1C in the last 72 hours. CBG: Recent Labs  Lab 08/14/20 0740 08/14/20 1120 08/14/20 1601 08/14/20 2103 08/15/20 0724  GLUCAP 89 91 80 95 114*   Lipid Profile: No results for input(s): CHOL, HDL, LDLCALC, TRIG, CHOLHDL, LDLDIRECT in the last 72 hours. Thyroid Function Tests: No results for input(s): TSH, T4TOTAL, FREET4, T3FREE, THYROIDAB in the last 72 hours. Anemia Panel: No results for input(s): VITAMINB12, FOLATE, FERRITIN, TIBC, IRON, RETICCTPCT in the last 72 hours. Sepsis Labs: No results for input(s): PROCALCITON, LATICACIDVEN in the last 168 hours.  Recent Results (from the past 240 hour(s))  Resp Panel by RT-PCR (Flu A&B, Covid) Nasopharyngeal Swab     Status: None    Collection Time: 08/11/20  9:48 PM   Specimen: Nasopharyngeal Swab; Nasopharyngeal(NP) swabs in vial transport medium  Result Value Ref Range Status   SARS Coronavirus 2 by RT PCR NEGATIVE NEGATIVE Final    Comment: (NOTE) SARS-CoV-2 target nucleic acids are NOT DETECTED.  The SARS-CoV-2 RNA is generally detectable in upper respiratory specimens during the acute phase of infection. The lowest concentration of SARS-CoV-2 viral copies this assay can detect is 138 copies/mL. A negative result does not preclude SARS-Cov-2 infection and should not be used as the sole basis for treatment or other patient management decisions. A negative result may occur with  improper specimen collection/handling, submission of specimen other than nasopharyngeal swab, presence of viral mutation(s) within the areas targeted by this assay, and inadequate number of viral  copies(<138 copies/mL). A negative result must be combined with clinical observations, patient history, and epidemiological information. The expected result is Negative.  Fact Sheet for Patients:  EntrepreneurPulse.com.au  Fact Sheet for Healthcare Providers:  IncredibleEmployment.be  This test is no t yet approved or cleared by the [place] FDA and  has been authorized for detection and/or diagnosis of SARS-CoV-2 by FDA under an Emergency Use Authorization (EUA). This EUA will remain  in effect (meaning this test can be used) for the duration of the COVID-19 declaration under Section 564(b)(1) of the Act, 21 U.S.C.section 360bbb-3(b)(1), unless the authorization is terminated  or revoked sooner.       Influenza A by PCR NEGATIVE NEGATIVE Final   Influenza B by PCR NEGATIVE NEGATIVE Final    Comment: (NOTE) The Xpert Xpress SARS-CoV-2/FLU/RSV plus assay is intended as an aid in the diagnosis of influenza from Nasopharyngeal swab specimens and should not be used as a sole basis for treatment.  Nasal washings and aspirates are unacceptable for Xpert Xpress SARS-CoV-2/FLU/RSV testing.  Fact Sheet for Patients: EntrepreneurPulse.com.au  Fact Sheet for Healthcare Providers: IncredibleEmployment.be  This test is not yet approved or cleared by the [place] FDA and has been authorized for detection and/or diagnosis of SARS-CoV-2 by FDA under an Emergency Use Authorization (EUA). This EUA will remain in effect (meaning this test can be used) for the duration of the COVID-19 declaration under Section 564(b)(1) of the Act, 21 U.S.C. section 360bbb-3(b)(1), unless the authorization is terminated or revoked.  Performed at [facility], [place] [address], [place], [place] [address]       Radiology Studies: No results found.    Scheduled Meds: . amantadine  100 mg Oral Daily  . carbidopa-levodopa  1 tablet Oral BID  . enoxaparin (LOVENOX) injection  40 mg Subcutaneous Q24H  . insulin aspart  0-5 Units Subcutaneous QHS  . insulin aspart  0-9 Units Subcutaneous TID WC  . lacosamide  100 mg Oral BID  . levETIRAcetam  1,500 mg Oral BID  . levothyroxine  75 mcg Oral Q0600  . mouth rinse  15 mL Mouth Rinse BID  . vitamin B-12  2,000 mcg Oral QHS   Continuous Infusions:    LOS: 0 days      Time spent: 20 minutes   Dessa Phi, DO [facility] 08/15/2020, 10:48 AM   Available via Epic secure chat 7am-7pm After these hours, please refer to coverage provider listed on [email or web address]

## 2020-08-15 NOTE — Evaluation (Signed)
Speech Language Pathology Evaluation Patient Details Name: Gerald Leach MRN: [id number] DOB: 1934/01/11 Today's Date: 08/15/2020 Time: 2130-8657 SLP Time Calculation (min) (ACUTE ONLY): 30 min  Problem List:  Patient Active Problem List   Diagnosis Date Noted  . Fall 08/11/2020  . Closed compression fracture of L5 lumbar vertebra, initial encounter ([place]) 08/11/2020  . Seizures ([place]) 03/05/2018  . Leukocytosis 03/05/2018  . Diabetic foot ulcers ([place]) 04/23/2017  . Parkinson disease ([place])   . Gait instability   . Diabetes mellitus type 2, controlled ([place]) 08/05/2015  . Hypothyroidism 08/05/2015  . Fall at home 10/02/2014  . CNS aneurysm s/p coiling ([facility], 2002) 10/02/2014  . High grade T7 central canal stenosis with T7 vertebral fracture s/p fall (02/2012) 10/02/2014  . 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 10/02/2014  . Syncope 02/05/2014  . Drug-induced delirium(292.81) 05/16/2013  . Seizure disorder ([place]) 04/21/2013  . Seizure disorder, grand mal ([place]) 03/24/2013  . Cervical compression fracture---C7 10/29/2012  . Dysphagia 09/21/2012  . Subdural hematoma ([place]) 09/20/2012  . SOLAR KERATOSIS 02/17/2009  . CARCINOMA, SKIN, SQUAMOUS CELL, FACE 04/19/2008  . Hyperlipidemia, group D 01/27/2008  . COLONIC POLYPS 11/15/2006  . GERD 11/15/2006   Past Medical History:  Past Medical History:  Diagnosis Date  . Brain aneurysm   . Colonic polyp 2003  . Dementia ([place])   . Diabetes mellitus without complication ([place])   . ED (erectile dysfunction)   . GERD (gastroesophageal reflux disease)   . Hyperlipidemia   . Hypertension   . Hypothyroidism   . Seizures ([place])    per family  . Seizures ([place])    Past Surgical History:  Past Surgical History:  Procedure Laterality Date  . Aneurysmal clipping    . brain aneurysm surgery     at [facility]  . carotid artery aneurysm    . COLONOSCOPY     polyps  . CRANIOTOMY Right  09/19/2012   Procedure: CRANIOTOMY HEMATOMA EVACUATION SUBDURAL;  Surgeon: Otilio Connors, MD;  Location: [place] NEURO ORS;  Service: Neurosurgery;  Laterality: Right;  . CRANIOTOMY Right 09/22/2012   Procedure: CRANIOTOMY HEMATOMA EVACUATION SUBDURAL;  Surgeon: Otilio Connors, MD;  Location: [place] NEURO ORS;  Service: Neurosurgery;  Laterality: Right;  . Craniotomy.    . right hernia     HPI:  Gerald Leach is a 87 y.o. male with medical history significant of dementia, diabetes, hypertension, hyperlipidemia, brain aneurysm with previous bleeds status post 2 surgeries in the past, Parkinson's disease, seizure disorder, gait abnormalities with fall with brought in by family due to worsening falls at home. Evaluation in the ER showed L5 compression fracture.  Head CT and CXR were unremarkable. During BSE, patient's son expressed concern that patient's cognitive function has declined and was requesting further evaluation.   Assessment / Plan / Recommendation Clinical Impression  Patient presents with a moderate cognitive-linguistic impairment as per formal and informal testing. He participated in completing the [place] ([place] Status exam) and received a score of 11 which is in the range for 'Dementia' (scores 1-20). He is oriented to year and day of week but not month; he knows he is in a [facility] but unable to name; aware that he has been in the hospital "4 days" and was oriented to general time of day. He followed 2 step verbal directions without difficulty but with 3 step directions he would perform only 2 of the 3 steps. When asked what  was difficult for him, he said "dividing, deciding what's important to do in the day with the things that can wait". When asked daily routine, he spoke in general terms and SLP had to ask numerous f/u questions. He had difficulty maintaining active attention and performance with verbal problem solving tasks, required extra time due to slow cognitive  processing. He did not demonstrate adequate self-monitoring of his performance or adequate awareness to his deficits. Patient is not safe to return to his PLOF which was living home alone and he will require 24 hour supervision and assistance likely for the rest of his life. Per chart review, son is aware of this and plan is for short term placement in SNF. Son is already working on plans for after SNF due to need for continued supervision and assistance.    SLP Assessment  SLP Recommendation/Assessment: Patient needs continued Speech Lanaguage Pathology Services SLP Visit Diagnosis: Cognitive communication deficit (R41.841)    Follow Up Recommendations  24 hour supervision/assistance;Skilled Nursing facility    Frequency and Duration min 1 x/week  1 week      SLP Evaluation Cognition  Overall Cognitive Status: Difficult to assess Arousal/Alertness: Awake/alert Orientation Level: Oriented to person;Other (comment) (oriented to year, day of week, aware he is in a [facility] hospital, aware of current length of stay ("4 days")) Attention: Sustained Sustained Attention: Impaired Sustained Attention Impairment: Verbal basic;Verbal complex Memory: Impaired Memory Impairment: Retrieval deficit;Storage deficit;Other (comment) (recalled 3/5 words after 3 minute delay) Awareness: Impaired Awareness Impairment: Intellectual impairment Problem Solving: Impaired Problem Solving Impairment: Verbal complex Executive Function: Initiating;Self Monitoring Initiating: Impaired Initiating Impairment: Verbal basic;Verbal complex Self Monitoring: Impaired Self Monitoring Impairment: Verbal complex;Verbal basic;Functional basic Safety/Judgment: Impaired       Comprehension  Auditory Comprehension Yes/No Questions: Impaired Complex Questions: Other (comment) (90% RIPA-2 subtest X) Commands: Impaired Two Step Basic Commands: Other (comment) (100%) Multistep Basic Commands: Other (comment) (75% for  3 step) Conversation: Simple Interfering Components: Hearing;Processing speed;Working memory EffectiveTechniques: Extra processing time;Repetition;Slowed speech;Increased volume Visual Recognition/Discrimination Discrimination: Within Function Limits Reading Comprehension Reading Status: Not tested    Expression Expression Primary Mode of Expression: Verbal Verbal Expression Overall Verbal Expression: Impaired Initiation: Impaired Level of Generative/Spontaneous Verbalization: Phrase;Sentence Repetition: No impairment Naming: No impairment Pragmatics: Impairment Impairments: Monotone;Eye contact;Abnormal affect Interfering Components: Attention Effective Techniques: Semantic cues;Open ended questions Non-Verbal Means of Communication: Not applicable Written Expression Dominant Hand: Right   Oral / Motor  Oral Motor/Sensory Function Overall Oral Motor/Sensory Function: Within functional limits   [place], MA, CCC-SLP Speech Therapy

## 2020-08-16 DIAGNOSIS — R531 Weakness: Secondary | ICD-10-CM | POA: Diagnosis not present

## 2020-08-16 DIAGNOSIS — F0280 Dementia in other diseases classified elsewhere without behavioral disturbance: Secondary | ICD-10-CM

## 2020-08-16 DIAGNOSIS — E119 Type 2 diabetes mellitus without complications: Secondary | ICD-10-CM | POA: Diagnosis not present

## 2020-08-16 DIAGNOSIS — W19XXXA Unspecified fall, initial encounter: Secondary | ICD-10-CM | POA: Diagnosis not present

## 2020-08-16 DIAGNOSIS — Z7401 Bed confinement status: Secondary | ICD-10-CM | POA: Diagnosis not present

## 2020-08-16 DIAGNOSIS — M2550 Pain in unspecified joint: Secondary | ICD-10-CM | POA: Diagnosis not present

## 2020-08-16 DIAGNOSIS — G20 Parkinson's disease: Secondary | ICD-10-CM

## 2020-08-16 LAB — GLUCOSE, CAPILLARY
Glucose-Capillary: 91 mg/dL (ref 70–99)
Glucose-Capillary: 157 mg/dL — ABNORMAL HIGH (ref 70–99)

## 2020-08-16 MED ORDER — POLYETHYLENE GLYCOL 3350 17 G PO PACK
17.0000 g | PACK | Freq: Every day | ORAL | Status: DC
  Administered 2020-08-16: 17 g via ORAL
  Filled 2020-08-16: qty 1

## 2020-08-16 MED ORDER — DOCUSATE SODIUM 100 MG PO CAPS
100.0000 mg | ORAL_CAPSULE | Freq: Every day | ORAL | Status: DC
  Administered 2020-08-16: 100 mg via ORAL
  Filled 2020-08-16: qty 1

## 2020-08-16 NOTE — Progress Notes (Signed)
Called report to [facility]. Gave report to [facility]

## 2020-08-16 NOTE — TOC Transition Note (Signed)
Transition of Care [facility]) - CM/SW Discharge Note   Patient Details  Name: Gerald Leach MRN: [id number] Date of Birth: 1934-05-22  [facility]) CM/SW Contact:  Lennart Pall, LCSW Phone Number: 08/16/2020, 12:45 PM   Clinical Narrative:    Have reviewed SNF bed offers with pt/ son [name]) and they have accepted bed offer at [facility]. Pt medically cleared for dc today.  PTAR has been called (12:50).  RN to call report to [phone] to RN, Lorenso Courier).  No further TOC needs.   Final next level of care: Skilled Nursing Facility Barriers to Discharge: Barriers Resolved   Patient Goals and CMS Choice Patient states their goals for this hospitalization and ongoing recovery are:: to go home CMS Medicare.gov Compare Post Acute Care list provided to:: Patient Choice offered to / list presented to : Adult Children  Discharge Placement   Existing PASRR number confirmed : 08/12/20          Patient chooses bed at: [facility]) Patient to be transferred to facility by: [place] Name of family member notified: son, [name] Patient and family notified of of transfer: 08/16/20  Discharge Plan and Services In-house Referral: Clinical Social Work   Post Acute Care Choice: [place]          DME Arranged: N/A DME Agency: NA                  Social Determinants of Health (SDOH) Interventions     Readmission Risk Interventions Readmission Risk Prevention Plan 08/12/2020  Post Dischage Appt Complete  Medication Screening Complete  Transportation Screening Complete  Some recent data might be hidden

## 2020-08-16 NOTE — Progress Notes (Signed)
Occupational Therapy Treatment Patient Details Name: Gerald Leach MRN: [id number] DOB: 1934/04/12 Today's Date: 08/16/2020    History of present illness Gerald Leach is an 87 y.o. male with medical history significant of dementia, diabetes, hypertension, hyperlipidemia, brain aneurysm with previous bleeds status post 2 surgeries in the past, Parkinson's disease, seizure disorder, gait abnormalities with fall with brought in by family today due to worsening falls at home.Work up in the ER showed L5 compression fracture. Discussion with neurosurgery regarding his compression fracture.  Recommendation is supportive care.   OT comments  Pt needing increased time for increased alertness and direction following during session. Cue patient in log roll technique with mod A to complete lowering legs and elevating trunk to sitting. Seated pt wash face/hands with fair sitting balance. With bed height elevated pt max A x1 to power up to standing with strong posterior bias, needing assist to maneuver walker and tactile input to LEs to advance few steps to recliner. Continue with POC.   Follow Up Recommendations  SNF    Equipment Recommendations  None recommended by OT       Precautions / Restrictions Precautions Precautions: Fall Precaution Comments: Parkinsons Required Braces or Orthoses: Spinal Brace Spinal Brace: Applied in sitting position Restrictions Weight Bearing Restrictions: No       Mobility Bed Mobility Overal bed mobility: Needs Assistance Bed Mobility: Rolling;Sidelying to Sit Rolling: Min assist Sidelying to sit: Mod assist;HOB elevated       General bed mobility comments: cues for log roll techniue needing assist to reach L UE toward EOB. mod A to elevate trunk to sitting upright and lower LEs off EOB    Transfers Overall transfer level: Needs assistance Equipment used: Rolling walker (2 wheeled) Transfers: Sit to/from [facility] Sit to Stand: Max  assist;From elevated surface Stand pivot transfers: Max assist       General transfer comment: max A to power up to standing, posterior bias with cues to correct posture. increased difficulty weight shifting needing assist to maneuver walker and tactile cues to mobilize LEs few steps to recliner    Balance Overall balance assessment: Needs assistance Sitting-balance support: Feet supported Sitting balance-Leahy Scale: Fair   Postural control: Posterior lean Standing balance support: Bilateral upper extremity supported Standing balance-Leahy Scale: Poor Standing balance comment: reliant on RW and support for posterior bias                           ADL either performed or assessed with clinical judgement   ADL Overall ADL's : Needs assistance/impaired Eating/Feeding: Set up;Sitting Eating/Feeding Details (indicate cue type and reason): to open containers Grooming: Wash/dry face;Wash/dry hands;Set up;Sitting               Lower Body Dressing: Total assistance;Sitting/lateral leans Lower Body Dressing Details (indicate cue type and reason): to don sock Toilet Transfer: Maximal assistance;Cueing for safety;Cueing for sequencing;Stand-pivot;RW;BSC Toilet Transfer Details (indicate cue type and reason): to recliner, strong posterior lean upon standing, difficulty weight shifting needing assist to turn walker and tactile cues to advance LEs toward chair         Functional mobility during ADLs: Maximal assistance;Cueing for safety;Cueing for sequencing;Rolling walker General ADL Comments: patient requiring significant assistance with self care due to poor activity tolerance, standing balance, safety awareness               Cognition Arousal/Alertness: Awake/alert Behavior During Therapy: Flat affect Overall Cognitive Status: No  family/caregiver present to determine baseline cognitive functioning                                 General Comments:  needing repeated directions at times                   Pertinent Vitals/ Pain       Pain Assessment: Faces Faces Pain Scale: Hurts little more Pain Location: back Pain Descriptors / Indicators: Discomfort Pain Intervention(s): Monitored during session         Frequency  Min 2X/week        Progress Toward Goals  OT Goals(current goals can now be found in the care plan section)  Progress towards OT goals: Progressing toward goals  Acute Rehab OT Goals Patient Stated Goal: walk better OT Goal Formulation: With patient Time For Goal Achievement: 08/27/20 Potential to Achieve Goals: Good ADL Goals Pt Will Perform Lower Body Dressing: with mod assist;sit to/from stand Pt Will Transfer to Toilet: with min guard assist;regular height toilet;ambulating;grab bars Pt Will Perform Toileting - Clothing Manipulation and hygiene: with min guard assist;sit to/from stand Pt/caregiver will Perform Home Exercise Program: Increased strength;Right Upper extremity;Left upper extremity;With theraband Additional ADL Goal #1: Patient will stand at sink to perform grooming task as evidence of improving activity tolerance  Plan Discharge plan remains appropriate       AM-PAC OT "6 Clicks" Daily Activity     Outcome Measure   Help from another person eating meals?: A Little Help from another person taking care of personal grooming?: A Little Help from another person toileting, which includes using toliet, bedpan, or urinal?: Total Help from another person bathing (including washing, rinsing, drying)?: A Lot Help from another person to put on and taking off regular upper body clothing?: A Little Help from another person to put on and taking off regular lower body clothing?: Total 6 Click Score: 13    End of Session Equipment Utilized During Treatment: Rolling walker  OT Visit Diagnosis: Unsteadiness on feet (R26.81);Other abnormalities of gait and mobility (R26.89);History of falling  (Z91.81);Muscle weakness (generalized) (M62.81);Pain Pain - part of body:  (back)   Activity Tolerance Patient tolerated treatment well   Patient Left in chair;with call bell/phone within reach;with chair alarm set   Nurse Communication Mobility status        Time: 3845-3646 OT Time Calculation (min): 19 min  Charges: OT General Charges $OT Visit: 1 Visit OT Treatments $Self Care/Home Management : 8-22 mins  Delbert Phenix OT OT pager: [place] 08/16/2020, 9:56 AM

## 2020-08-16 NOTE — Discharge Summary (Signed)
Physician Discharge Summary  PRADEEP BEAUBRUN QQI:[id number] DOB: 1934-04-07 DOA: 08/11/2020  PCP: [name], NP  Admit date: 08/11/2020 Discharge date: 08/16/2020  Admitted From: Home Disposition:  SNF   Recommendations for Outpatient Follow-up:  1. Follow up with PCP  Discharge Condition: Stable CODE STATUS: Full  Diet recommendation:  Diet Orders (From admission, onward)    Start     Ordered   08/14/20 0855  Diet regular Room service appropriate? Yes; Fluid consistency: Thin  Diet effective now       Question Answer Comment  Room service appropriate? Yes   Fluid consistency: Thin      08/14/20 0854         Brief/Interim Summary: Gerald Leach is an 87 y.o.malewith medical history significant ofdementia, diabetes, hypertension, hyperlipidemia, brain aneurysm with previous bleeds status post 2 surgeries in the past, Parkinson's disease, seizure disorder, gait abnormalities with fall with brought in by family today due to worsening falls at home. Patient has been more progressively weak. He has had at least 4 falls in the last 3 days. He is unable to stay alone. Most of the time cannot get up and has to stay down for a while until a family member arrives. Patient is unable to take care of himself. Work up in the ER showed L5 compression fracture. Discussion with neurosurgery regarding his compression fracture. Recommendation is supportive care.   Patient was evaluated by physical therapy who recommended SNF placement.  Discharge Diagnoses:  Principal Problem:   Fall Active Problems:   GERD   Diabetes mellitus type 2, controlled ([place])   Hypothyroidism   Parkinson disease ([place])   Gait instability   Seizures (HCC)   Closed compression fracture of L5 lumbar vertebra, initial encounter ([place])   Dementia due to Parkinson'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 behavioral disturbance (HCC)   Recurrent falls with gait abnormality, history of vascular parkinsonism, history of subdural hematoma  s/p craniotomy in 2014  -Concern for return back to his home setting, lives at home alone and has had multiple falls  -Continue sinemet  -PT recommending SNF placement  L5 compression fracture -Lumbar spine xray: Anterior wedge compression deformity of the L5 vertebral body, with approximately 50% loss of height. This is new since 2019, but otherwise age indeterminate. Chronic appearing invagination of the superior endplates of L2, L3, and L4. -Neurosurgery has recommended supportive care, brace  Diabetes -Ha1c 5.6  -Sliding-scale insulin  Hypothyroidism  -Continue synthroid   Generalized seizure disorderdue tosubdural hematoma 09/2012 -Follows with Dr. Narda [facility] -Continue Keppra and vimpat   Multifactorial dementia, moderate -Continue amantidine   Discharge Instructions  Discharge Instructions    Increase activity slowly   Complete by: As directed      Allergies as of 08/16/2020   No Known Allergies     Medication List    STOP taking these medications   nystatin powder Commonly known as: MYCOSTATIN/NYSTOP   omeprazole 20 MG capsule Commonly known as: PRILOSEC   simvastatin 10 MG tablet Commonly known as: ZOCOR     TAKE these medications   amantadine 100 MG capsule Commonly known as: SYMMETREL TAKE 1 CAPSULE BY MOUTH EVERY DAY What changed: how much to take   carbidopa-levodopa 25-100 MG tablet Commonly known as: SINEMET IR Take 1 tablet three times daily What changed:   how much to take  how to take this  when to take this  additional instructions   glucose blood test strip Commonly known as: OneTouch Verio Use once  daily Dx E11.9   levETIRAcetam 750 MG tablet Commonly known as: KEPPRA TAKE 2 TABLETS (1,500 MG TOTAL) BY MOUTH 2 (TWO) TIMES DAILY.   levothyroxine 75 MCG tablet Commonly known as: SYNTHROID TAKE 1 TABLET BY MOUTH EVERY DAY What changed: when to take this   metFORMIN 500 MG tablet Commonly known as:  GLUCOPHAGE TAKE 1 TABLET (500 MG TOTAL) BY MOUTH 2 (TWO) TIMES DAILY WITH A MEAL. NEED APPOINTMENT What changed: See the new instructions.   onetouch ultrasoft lancets Use as instructed   Vimpat 100 MG Tabs Generic drug: Lacosamide TAKE 1 TABLET BY MOUTH TWICE A DAY What changed:   how much to take  Another medication with the same name was removed. Continue taking this medication, and follow the directions you see here.   vitamin B-12 1000 MCG tablet Commonly known as: CYANOCOBALAMIN Take 2,000 mcg by mouth at bedtime.       Follow-up Information    Nafziger, Tommi Rumps, NP Follow up.   Specialty: Family Medicine Contact information: [address] [place] [address] [phone]              No Known Allergies   Procedures/Studies: DG Chest 2 View  Result Date: 08/11/2020 CLINICAL DATA:  [place] several days ago, back pain EXAM: CHEST - 2 VIEW COMPARISON:  03/07/2018 FINDINGS: Frontal and lateral views of the chest demonstrate an unremarkable cardiac silhouette. No airspace disease, effusion, or pneumothorax. Chronic midthoracic wedge compression deformity again noted. Invagination of the superior endplates in the upper and mid lumbar spine identified, age indeterminate. Please refer to separately reported lumbar spine series. IMPRESSION: 1. No acute intrathoracic process. 2. Chronic midthoracic wedge compression deformity. 3. Age indeterminate compression fractures lumbar spine, please refer to dedicated lumbar spine evaluation performed concurrently. Electronically Signed   By: Randa Ngo M.D.   On: 08/11/2020 18:49   DG Lumbar Spine Complete  Result Date: 08/11/2020 CLINICAL DATA:  [place] several days ago, inability to bear weight, right hip pain EXAM: LUMBAR SPINE - COMPLETE 4+ VIEW COMPARISON:  03/11/2018 FINDINGS: Frontal, bilateral oblique, lateral views of the lumbar spine are obtained. There are 5 non-rib-bearing lumbar type vertebral bodies, with hemi  sacralization of the L5 vertebral body on the right. There is anterior wedge compression deformity of the L5 vertebral body, with approximately 50% loss of height. No retropulsion. This is age indeterminate, but new since 2019. Mild invagination of the superior endplates of L2, L3, and L4 age indeterminate but likely chronic. Prominent left convex scoliosis centered at L3. Severe facet hypertrophy throughout the entire lumbar spine, greatest at the lumbosacral junction. Sacroiliac joints are unremarkable. IMPRESSION: 1. Anterior wedge compression deformity of the L5 vertebral body, with approximately 50% loss of height. This is new since 2019, but otherwise age indeterminate. 2. Chronic appearing invagination of the superior endplates of L2, L3, and L4. Electronically Signed   By: Randa Ngo M.D.   On: 08/11/2020 18:55   CT Head Wo Contrast  Result Date: 08/11/2020 CLINICAL DATA:  [place] several days ago EXAM: CT HEAD WITHOUT CONTRAST TECHNIQUE: Contiguous axial images were obtained from the base of the skull through the vertex without intravenous contrast. COMPARISON:  03/05/2018 FINDINGS: Brain: Confluent hypodensities throughout the periventricular white matter are compatible with chronic small vessel ischemic changes. No signs of acute infarct or hemorrhage. No acute extra-axial fluid collections. No mass effect. Vascular: Calcified supraclinoid right ICA aneurysm measures 2.5 x 1.6 cm in transverse dimension unchanged since prior study. No hyperdense  vessel. Skull: Postsurgical changes from previous right frontotemporal craniotomy. No acute bony abnormalities. Sinuses/Orbits: No acute finding. Other: None. IMPRESSION: 1. Stable chronic small-vessel ischemic changes. No acute intracranial process. 2. Stable calcified supraclinoid right ICA aneurysm. Electronically Signed   By: Randa Ngo M.D.   On: 08/11/2020 18:45   DG Hip Unilat W or Wo Pelvis 2-3 Views Right  Result Date: 08/11/2020 CLINICAL  DATA:  [place] several days ago, inability to bear weight, right hip pain EXAM: DG HIP (WITH OR WITHOUT PELVIS) 2-3V RIGHT COMPARISON:  08/20/2018 FINDINGS: Frontal view of the pelvis as well as frontal and frogleg lateral views of the right hip are obtained. No acute fracture, subluxation, or dislocation. Stable mild bilateral hip osteoarthritis. Sacroiliac joints are unremarkable. IMPRESSION: 1. No acute displaced fracture. 2. Stable osteoarthritis. Electronically Signed   By: Randa Ngo M.D.   On: 08/11/2020 18:51       Discharge Exam: Vitals:   08/15/20 2146 08/16/20 0502  BP: (!) 165/80 (!) 165/83  Pulse: (!) 49 (!) 45  Resp: 17 18  Temp: (!) 97.5 F (36.4 C) 97.7 F (36.5 C)  SpO2: 98% 98%    General: Pt is alert to voice, not in acute distress Cardiovascular: RRR, S1/S2 +, no edema Respiratory: CTA bilaterally, no wheezing, no rhonchi, no respiratory distress, on room air  Abdominal: Soft, NT, ND, bowel sounds + Extremities: no edema, no cyanosis Psych: +Dementia    The results of significant diagnostics from this hospitalization (including imaging, microbiology, ancillary and laboratory) are listed below for reference.     Microbiology: Recent Results (from the past 240 hour(s))  Resp Panel by RT-PCR (Flu A&B, Covid) Nasopharyngeal Swab     Status: None   Collection Time: 08/11/20  9:48 PM   Specimen: Nasopharyngeal Swab; Nasopharyngeal(NP) swabs in vial transport medium  Result Value Ref Range Status   SARS Coronavirus 2 by RT PCR NEGATIVE NEGATIVE Final    Comment: (NOTE) SARS-CoV-2 target nucleic acids are NOT DETECTED.  The SARS-CoV-2 RNA is generally detectable in upper respiratory specimens during the acute phase of infection. The lowest concentration of SARS-CoV-2 viral copies this assay can detect is 138 copies/mL. A negative result does not preclude SARS-Cov-2 infection and should not be used as the sole basis for treatment or other patient management  decisions. A negative result may occur with  improper specimen collection/handling, submission of specimen other than nasopharyngeal swab, presence of viral mutation(s) within the areas targeted by this assay, and inadequate number of viral copies(<138 copies/mL). A negative result must be combined with clinical observations, patient history, and epidemiological information. The expected result is Negative.  Fact Sheet for Patients:  EntrepreneurPulse.com.au  Fact Sheet for Healthcare Providers:  IncredibleEmployment.be  This test is no t yet approved or cleared by the [place] FDA and  has been authorized for detection and/or diagnosis of SARS-CoV-2 by FDA under an Emergency Use Authorization (EUA). This EUA will remain  in effect (meaning this test can be used) for the duration of the COVID-19 declaration under Section 564(b)(1) of the Act, 21 U.S.C.section 360bbb-3(b)(1), unless the authorization is terminated  or revoked sooner.       Influenza A by PCR NEGATIVE NEGATIVE Final   Influenza B by PCR NEGATIVE NEGATIVE Final    Comment: (NOTE) The Xpert Xpress SARS-CoV-2/FLU/RSV plus assay is intended as an aid in the diagnosis of influenza from Nasopharyngeal swab specimens and should not be used as a sole basis for treatment. Nasal washings  and aspirates are unacceptable for Xpert Xpress SARS-CoV-2/FLU/RSV testing.  Fact Sheet for Patients: EntrepreneurPulse.com.au  Fact Sheet for Healthcare Providers: IncredibleEmployment.be  This test is not yet approved or cleared by the [place] FDA and has been authorized for detection and/or diagnosis of SARS-CoV-2 by FDA under an Emergency Use Authorization (EUA). This EUA will remain in effect (meaning this test can be used) for the duration of the COVID-19 declaration under Section 564(b)(1) of the Act, 21 U.S.C. section 360bbb-3(b)(1), unless the  authorization is terminated or revoked.  Performed at [facility], [place] [address], [place], [place] [address]      Labs: BNP (last 3 results) No results for input(s): BNP in the last 8760 hours. Basic Metabolic Panel: Recent Labs  Lab 08/11/20 1644 08/11/20 2315 08/12/20 0307  NA 137  --  138  K 3.7  --  3.8  CL 100  --  105  CO2 26  --  23  GLUCOSE 91  --  87  BUN 19  --  20  CREATININE 0.88 0.87 0.74  CALCIUM 9.4  --  8.9   Liver Function Tests: Recent Labs  Lab 08/11/20 1644 08/12/20 0307  AST 24 22  ALT 12 18  ALKPHOS 80 70  BILITOT 0.9 0.9  PROT 8.2* 6.6  ALBUMIN 4.1 3.5   No results for input(s): LIPASE, AMYLASE in the last 168 hours. No results for input(s): AMMONIA in the last 168 hours. CBC: Recent Labs  Lab 08/11/20 1644 08/11/20 2315 08/12/20 0307  WBC 9.8 8.1 7.8  NEUTROABS 6.4  --   --   HGB 15.7 15.1 13.9  HCT 48.2 46.4 42.9  MCV 88.6 88.4 87.9  PLT 260 165 235   Cardiac Enzymes: No results for input(s): CKTOTAL, CKMB, CKMBINDEX, TROPONINI in the last 168 hours. BNP: Invalid input(s): POCBNP CBG: Recent Labs  Lab 08/15/20 0724 08/15/20 1127 08/15/20 1703 08/15/20 2142 08/16/20 0741  GLUCAP 114* 153* 80 115* 91   D-Dimer No results for input(s): DDIMER in the last 72 hours. Hgb A1c No results for input(s): HGBA1C in the last 72 hours. Lipid Profile No results for input(s): CHOL, HDL, LDLCALC, TRIG, CHOLHDL, LDLDIRECT in the last 72 hours. Thyroid function studies No results for input(s): TSH, T4TOTAL, T3FREE, THYROIDAB in the last 72 hours.  Invalid input(s): FREET3 Anemia work up No results for input(s): VITAMINB12, FOLATE, FERRITIN, TIBC, IRON, RETICCTPCT in the last 72 hours. Urinalysis    Component Value Date/Time   COLORURINE YELLOW 08/11/2020 1721   APPEARANCEUR CLEAR 08/11/2020 1721   LABSPEC 1.025 08/11/2020 1721   PHURINE 6.0 08/11/2020 1721   GLUCOSEU NEGATIVE 08/11/2020 1721   HGBUR  NEGATIVE 08/11/2020 1721   HGBUR trace-intact 02/22/2010 0850   BILIRUBINUR NEGATIVE 08/11/2020 1721   BILIRUBINUR 1+ 03/11/2018 1808   KETONESUR 20 (A) 08/11/2020 1721   PROTEINUR 30 (A) 08/11/2020 1721   UROBILINOGEN 4.0 (A) 03/11/2018 1808   UROBILINOGEN 1.0 10/02/2014 2242   NITRITE NEGATIVE 08/11/2020 1721   LEUKOCYTESUR NEGATIVE 08/11/2020 1721   Sepsis Labs Invalid input(s): PROCALCITONIN,  WBC,  LACTICIDVEN Microbiology Recent Results (from the past 240 hour(s))  Resp Panel by RT-PCR (Flu A&B, Covid) Nasopharyngeal Swab     Status: None   Collection Time: 08/11/20  9:48 PM   Specimen: Nasopharyngeal Swab; Nasopharyngeal(NP) swabs in vial transport medium  Result Value Ref Range Status   SARS Coronavirus 2 by RT PCR NEGATIVE NEGATIVE Final    Comment: (NOTE) SARS-CoV-2 target nucleic  acids are NOT DETECTED.  The SARS-CoV-2 RNA is generally detectable in upper respiratory specimens during the acute phase of infection. The lowest concentration of SARS-CoV-2 viral copies this assay can detect is 138 copies/mL. A negative result does not preclude SARS-Cov-2 infection and should not be used as the sole basis for treatment or other patient management decisions. A negative result may occur with  improper specimen collection/handling, submission of specimen other than nasopharyngeal swab, presence of viral mutation(s) within the areas targeted by this assay, and inadequate number of viral copies(<138 copies/mL). A negative result must be combined with clinical observations, patient history, and epidemiological information. The expected result is Negative.  Fact Sheet for Patients:  EntrepreneurPulse.com.au  Fact Sheet for Healthcare Providers:  IncredibleEmployment.be  This test is no t yet approved or cleared by the [place] FDA and  has been authorized for detection and/or diagnosis of SARS-CoV-2 by FDA under an Emergency Use  Authorization (EUA). This EUA will remain  in effect (meaning this test can be used) for the duration of the COVID-19 declaration under Section 564(b)(1) of the Act, 21 U.S.C.section 360bbb-3(b)(1), unless the authorization is terminated  or revoked sooner.       Influenza A by PCR NEGATIVE NEGATIVE Final   Influenza B by PCR NEGATIVE NEGATIVE Final    Comment: (NOTE) The Xpert Xpress SARS-CoV-2/FLU/RSV plus assay is intended as an aid in the diagnosis of influenza from Nasopharyngeal swab specimens and should not be used as a sole basis for treatment. Nasal washings and aspirates are unacceptable for Xpert Xpress SARS-CoV-2/FLU/RSV testing.  Fact Sheet for Patients: EntrepreneurPulse.com.au  Fact Sheet for Healthcare Providers: IncredibleEmployment.be  This test is not yet approved or cleared by the [place] FDA and has been authorized for detection and/or diagnosis of SARS-CoV-2 by FDA under an Emergency Use Authorization (EUA). This EUA will remain in effect (meaning this test can be used) for the duration of the COVID-19 declaration under Section 564(b)(1) of the Act, 21 U.S.C. section 360bbb-3(b)(1), unless the authorization is terminated or revoked.  Performed at [facility], [place] [address] [place],  [address]      Patient was seen and examined on the day of discharge and was found to be in stable condition. Time coordinating discharge: 30 minutes including assessment and coordination of care, as well as examination of the patient.   SIGNED:  Dessa Phi, DO [facility] 08/16/2020, 10:11 AM

## 2020-08-18 DIAGNOSIS — E119 Type 2 diabetes mellitus without complications: Secondary | ICD-10-CM | POA: Diagnosis not present

## 2020-08-18 DIAGNOSIS — E039 Hypothyroidism, unspecified: Secondary | ICD-10-CM | POA: Diagnosis not present

## 2020-08-18 DIAGNOSIS — R5381 Other malaise: Secondary | ICD-10-CM | POA: Diagnosis not present

## 2020-08-19 DIAGNOSIS — E119 Type 2 diabetes mellitus without complications: Secondary | ICD-10-CM | POA: Diagnosis not present

## 2020-08-19 DIAGNOSIS — E785 Hyperlipidemia, unspecified: Secondary | ICD-10-CM | POA: Diagnosis not present

## 2020-08-19 DIAGNOSIS — E039 Hypothyroidism, unspecified: Secondary | ICD-10-CM | POA: Diagnosis not present

## 2020-08-19 DIAGNOSIS — R569 Unspecified convulsions: Secondary | ICD-10-CM | POA: Diagnosis not present

## 2020-08-19 DIAGNOSIS — R5381 Other malaise: Secondary | ICD-10-CM | POA: Diagnosis not present

## 2020-08-19 DIAGNOSIS — I152 Hypertension secondary to endocrine disorders: Secondary | ICD-10-CM | POA: Diagnosis not present

## 2020-08-19 DIAGNOSIS — G20 Parkinson's disease: Secondary | ICD-10-CM | POA: Diagnosis not present

## 2020-08-19 DIAGNOSIS — E1159 Type 2 diabetes mellitus with other circulatory complications: Secondary | ICD-10-CM | POA: Diagnosis not present

## 2020-08-22 DIAGNOSIS — R569 Unspecified convulsions: Secondary | ICD-10-CM | POA: Diagnosis not present

## 2020-08-23 ENCOUNTER — Encounter (HOSPITAL_COMMUNITY): Payer: Self-pay

## 2020-08-23 ENCOUNTER — Emergency Department (HOSPITAL_COMMUNITY)
Admission: EM | Admit: 2020-08-23 | Discharge: 2020-08-24 | Disposition: A | Discharge status: Home / Self Care | Payer: Medicare Other | Attending: Emergency Medicine | Admitting: Emergency Medicine

## 2020-08-23 DIAGNOSIS — Z7984 Long term (current) use of oral hypoglycemic drugs: Secondary | ICD-10-CM | POA: Diagnosis not present

## 2020-08-23 DIAGNOSIS — E11621 Type 2 diabetes mellitus with foot ulcer: Secondary | ICD-10-CM | POA: Diagnosis not present

## 2020-08-23 DIAGNOSIS — F0280 Dementia in other diseases classified elsewhere without behavioral disturbance: Secondary | ICD-10-CM | POA: Insufficient documentation

## 2020-08-23 DIAGNOSIS — G20 Parkinson's disease: Secondary | ICD-10-CM | POA: Diagnosis not present

## 2020-08-23 DIAGNOSIS — R569 Unspecified convulsions: Secondary | ICD-10-CM | POA: Insufficient documentation

## 2020-08-23 DIAGNOSIS — R410 Disorientation, unspecified: Secondary | ICD-10-CM | POA: Diagnosis not present

## 2020-08-23 DIAGNOSIS — R404 Transient alteration of awareness: Secondary | ICD-10-CM | POA: Diagnosis not present

## 2020-08-23 DIAGNOSIS — L97409 Non-pressure chronic ulcer of unspecified heel and midfoot with unspecified severity: Secondary | ICD-10-CM | POA: Diagnosis not present

## 2020-08-23 DIAGNOSIS — E039 Hypothyroidism, unspecified: Secondary | ICD-10-CM | POA: Diagnosis not present

## 2020-08-23 DIAGNOSIS — Z79899 Other long term (current) drug therapy: Secondary | ICD-10-CM | POA: Insufficient documentation

## 2020-08-23 DIAGNOSIS — I10 Essential (primary) hypertension: Secondary | ICD-10-CM | POA: Diagnosis not present

## 2020-08-23 DIAGNOSIS — R4020 Unspecified coma: Secondary | ICD-10-CM | POA: Diagnosis not present

## 2020-08-23 DIAGNOSIS — R0902 Hypoxemia: Secondary | ICD-10-CM | POA: Diagnosis not present

## 2020-08-23 DIAGNOSIS — R000 Tachycardia, unspecified: Secondary | ICD-10-CM | POA: Diagnosis not present

## 2020-08-23 DIAGNOSIS — E119 Type 2 diabetes mellitus without complications: Secondary | ICD-10-CM | POA: Diagnosis not present

## 2020-08-23 LAB — CBC WITH DIFFERENTIAL/PLATELET
Abs Immature Granulocytes: 0.03 10*3/uL (ref 0.00–0.07)
Basophils Absolute: 0 10*3/uL (ref 0.0–0.1)
Basophils Relative: 1 %
Eosinophils Absolute: 0.2 10*3/uL (ref 0.0–0.5)
Eosinophils Relative: 2 %
HCT: 47.1 % (ref 39.0–52.0)
Hemoglobin: 15.2 g/dL (ref 13.0–17.0)
Immature Granulocytes: 0 %
Lymphocytes Relative: 21 %
Lymphs Abs: 1.8 10*3/uL (ref 0.7–4.0)
MCH: 28.7 pg (ref 26.0–34.0)
MCHC: 32.3 g/dL (ref 30.0–36.0)
MCV: 88.9 fL (ref 80.0–100.0)
Monocytes Absolute: 0.8 10*3/uL (ref 0.1–1.0)
Monocytes Relative: 9 %
Neutro Abs: 5.5 10*3/uL (ref 1.7–7.7)
Neutrophils Relative %: 67 %
Platelets: 313 10*3/uL (ref 150–400)
RBC: 5.3 MIL/uL (ref 4.22–5.81)
RDW: 13.2 % (ref 11.5–15.5)
WBC: 8.3 10*3/uL (ref 4.0–10.5)
nRBC: 0 % (ref 0.0–0.2)

## 2020-08-23 LAB — BASIC METABOLIC PANEL
Anion gap: 6 (ref 5–15)
BUN: 18 mg/dL (ref 8–23)
CO2: 28 mmol/L (ref 22–32)
Calcium: 8.7 mg/dL — ABNORMAL LOW (ref 8.9–10.3)
Chloride: 103 mmol/L (ref 98–111)
Creatinine, Ser: 0.85 mg/dL (ref 0.61–1.24)
GFR, Estimated: >60 mL/min (ref >60)
Glucose, Bld: 119 mg/dL — ABNORMAL HIGH (ref 70–99)
Potassium: 4.1 mmol/L (ref 3.5–5.1)
Sodium: 137 mmol/L (ref 135–145)

## 2020-08-23 MED ORDER — AMANTADINE HCL 100 MG PO CAPS
100.0000 mg | ORAL_CAPSULE | Freq: Every day | ORAL | Status: DC
  Administered 2020-08-24: 100 mg via ORAL
  Filled 2020-08-23: qty 1

## 2020-08-23 MED ORDER — SODIUM CHLORIDE 0.9 % IV SOLN
100.0000 mg | Freq: Once | INTRAVENOUS | Status: AC
  Administered 2020-08-23: 100 mg via INTRAVENOUS
  Filled 2020-08-23 (×2): qty 10

## 2020-08-23 MED ORDER — METFORMIN HCL 500 MG PO TABS
500.0000 mg | ORAL_TABLET | Freq: Two times a day (BID) | ORAL | Status: DC
Start: 2020-08-24 — End: 2020-08-24
  Administered 2020-08-24: 500 mg via ORAL
  Filled 2020-08-23: qty 1

## 2020-08-23 MED ORDER — LEVOTHYROXINE SODIUM 50 MCG PO TABS
75.0000 ug | ORAL_TABLET | Freq: Every day | ORAL | Status: DC
  Administered 2020-08-24: 75 ug via ORAL
  Filled 2020-08-23: qty 1

## 2020-08-23 MED ORDER — AMANTADINE HCL 100 MG PO CAPS: 100.0000 mg | ORAL_CAPSULE | Freq: Every day | ORAL | Status: DC

## 2020-08-23 MED ORDER — CARBIDOPA-LEVODOPA 25-100 MG PO TABS
1.0000 | ORAL_TABLET | Freq: Two times a day (BID) | ORAL | Status: DC
  Administered 2020-08-24: 1 via ORAL
  Filled 2020-08-23 (×2): qty 1

## 2020-08-23 MED ORDER — LEVETIRACETAM 500 MG PO TABS
1500.0000 mg | ORAL_TABLET | Freq: Two times a day (BID) | ORAL | Status: DC
  Administered 2020-08-24: 1500 mg via ORAL
  Filled 2020-08-23: qty 3

## 2020-08-23 MED ORDER — LACOSAMIDE 200 MG PO TABS
100.0000 mg | ORAL_TABLET | Freq: Two times a day (BID) | ORAL | Status: DC
  Administered 2020-08-24: 100 mg via ORAL
  Filled 2020-08-23: qty 2

## 2020-08-23 MED ORDER — LEVOTHYROXINE SODIUM 50 MCG PO TABS: 75.0000 ug | ORAL_TABLET | Freq: Every day | ORAL | Status: DC

## 2020-08-23 NOTE — ED Triage Notes (Signed)
Pt BIB EMS  Per EMS Pt was being transferred (PT[facility]) from [place] to [facility] when he had a grand mal seizure that lasted for 2 minutes. Pt is now responsive and alert.   Vitals 150/92 p 88 r 24 Spo2 100% room air  CBG 127

## 2020-08-23 NOTE — TOC Initial Note (Signed)
CSW received call from [place] [phone] with [facility]. Per facility they were not able to complete admission orders or meds for pt as pt did not make it to [facility]. Pt was leaving [place] going to [facility] and was rerouted to [facility] ED. [facility] is agreeable to accepting pt first thing in the morning.

## 2020-08-23 NOTE — ED Provider Notes (Signed)
Patient was supposed to go to [place] today.  He was being transferred and on his way had a seizure.  He then came over to the emergency department here.  Shortly after he arrived in the emergency department [facility] was called  and told the patient will be discharged shortly and sent to [facility].  After the evaluation which took approximately 90 minutes we arrange for discharge of the patient and called [facility] to send him over there but they refused to take the patient and they decided it was too late for them to take the patient and they did not make appropriate arrangements even though it was told to them with plenty of time what the plan was.  The patient now needs to stay in the emergency department because of [facility] refusal of taking the patient.  The patient will be transferred at 830 tomorrow morning to [facility]   Milton Ferguson, MD 08/23/20 1924

## 2020-08-23 NOTE — ED Provider Notes (Signed)
[facility] DEPT Provider Note   CSN: [id number] Arrival date & time: 08/23/20  1617     History Chief Complaint  Patient presents with  . Seizures    Gerald Leach is a 87 y.o. male.  Patient was being transferred from one nursing facility to [facility] and had a seizure on the way.  He was brought over here for evaluation.  Patient has a history of seizures and there is some question about whether he got his medicines appropriately.  He is on Keppra 1500 mg twice a day and Vimpat 100 mg twice a day  The history is provided by the patient and medical records. No language interpreter was used.  Seizures Seizure activity on arrival: yes   Seizure type:  Grand mal Preceding symptoms: no sensation of an aura present   Initial focality:  None Episode characteristics: abnormal movements and disorientation   Postictal symptoms: confusion   Return to baseline: yes   Severity:  Moderate Timing:  Once Progression:  Resolved      Past Medical History:  Diagnosis Date  . Brain aneurysm   . Colonic polyp 2003  . Dementia ([place])   . Diabetes mellitus without complication ([place])   . ED (erectile dysfunction)   . GERD (gastroesophageal reflux disease)   . Hyperlipidemia   . Hypertension   . Hypothyroidism   . Seizures ([place])    per family  . Seizures [facility])     Patient Active Problem List   Diagnosis Date Noted  . Dementia due to Parkinson'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 behavioral disturbance ([place]) 08/16/2020  . Fall 08/11/2020  . Closed compression fracture of L5 lumbar vertebra, initial encounter ([place]) 08/11/2020  . Seizures ([place]) 03/05/2018  . Leukocytosis 03/05/2018  . Diabetic foot ulcers ([place]) 04/23/2017  . Parkinson disease ([place])   . Gait instability   . Diabetes mellitus type 2, controlled ([place]) 08/05/2015  . Hypothyroidism 08/05/2015  . Fall at home 10/02/2014  . CNS aneurysm s/p coiling ([facility], 2002) 10/02/2014  . High grade T7 central canal  stenosis with T7 vertebral fracture s/p fall (02/2012) 10/02/2014  . 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 10/02/2014  . Syncope 02/05/2014  . Drug-induced delirium(292.81) 05/16/2013  . Seizure disorder ([place]) 04/21/2013  . Seizure disorder, grand mal ([place]) 03/24/2013  . Cervical compression fracture---C7 10/29/2012  . Dysphagia 09/21/2012  . Subdural hematoma ([place]) 09/20/2012  . SOLAR KERATOSIS 02/17/2009  . CARCINOMA, SKIN, SQUAMOUS CELL, FACE 04/19/2008  . Hyperlipidemia, group D 01/27/2008  . COLONIC POLYPS 11/15/2006  . GERD 11/15/2006    Past Surgical History:  Procedure Laterality Date  . Aneurysmal clipping    . brain aneurysm surgery     at [facility]  . carotid artery aneurysm    . COLONOSCOPY     polyps  . CRANIOTOMY Right 09/19/2012   Procedure: CRANIOTOMY HEMATOMA EVACUATION SUBDURAL;  Surgeon: Otilio Connors, MD;  Location: [facility] NEURO ORS;  Service: Neurosurgery;  Laterality: Right;  . CRANIOTOMY Right 09/22/2012   Procedure: CRANIOTOMY HEMATOMA EVACUATION SUBDURAL;  Surgeon: Otilio Connors, MD;  Location: [facility] NEURO ORS;  Service: Neurosurgery;  Laterality: Right;  . Craniotomy.    . right hernia         Family History  Problem Relation Age of Onset  . Liver disease Brother        Died  . Seizures Neg Hx     Social History   Tobacco Use  .  Smoking status: Never Smoker  . Smokeless tobacco: Never Used  Vaping Use  . Vaping Use: Never used  Substance Use Topics  . Alcohol use: Yes    Alcohol/week: 0.0 standard drinks    Comment: rum mixed in diet coke 3 a week  . Drug use: Yes    Types: Nitrous oxide    Home Medications Prior to Admission medications   Medication Sig Start Date End Date Taking? Authorizing Provider  amantadine (SYMMETREL) 100 MG capsule TAKE 1 CAPSULE BY MOUTH EVERY DAY Patient taking differently: Take 100 mg by mouth daily. 07/12/20   Patel, Arvin Collard K, DO  carbidopa-levodopa  (SINEMET IR) 25-100 MG tablet Take 1 tablet three times daily Patient taking differently: Take 1 tablet by mouth 2 (two) times daily. 10/09/19   Narda Amber K, DO  glucose blood (ONETOUCH VERIO) test strip Use once daily Dx E11.9 11/19/16   [name], NP  Lancets [facility] ULTRASOFT) lancets Use as instructed 09/16/15   Nafziger, Tommi Rumps, NP  levETIRAcetam (KEPPRA) 750 MG tablet TAKE 2 TABLETS (1,500 MG TOTAL) BY MOUTH 2 (TWO) TIMES DAILY. 04/29/20   Narda Amber K, DO  levothyroxine (SYNTHROID) 75 MCG tablet TAKE 1 TABLET BY MOUTH EVERY DAY Patient taking differently: Take 75 mcg by mouth daily before breakfast. 04/29/20   Nafziger, Tommi Rumps, NP  metFORMIN (GLUCOPHAGE) 500 MG tablet TAKE 1 TABLET (500 MG TOTAL) BY MOUTH 2 (TWO) TIMES DAILY WITH A MEAL. NEED APPOINTMENT Patient taking differently: Take 500 mg by mouth 2 (two) times daily with a meal. 04/29/20   Nafziger, Tommi Rumps, NP  VIMPAT 100 MG TABS TAKE 1 TABLET BY MOUTH TWICE A DAY Patient taking differently: Take 100 mg by mouth 2 (two) times daily. 04/29/20   Narda Amber K, DO  vitamin B-12 (CYANOCOBALAMIN) 1000 MCG tablet Take 2,000 mcg by mouth at bedtime.    [provider]    Allergies    Patient has no known allergies.  Review of Systems   Review of Systems  Unable to perform ROS: Dementia  Neurological: Positive for seizures.    Physical Exam Updated Vital Signs BP (!) 154/106   Pulse (!) 59   Temp 97.7 F (36.5 C)   Resp (!) 23   SpO2 95%   Physical Exam Vitals and nursing note reviewed.  Constitutional:      Appearance: He is well-developed.  HENT:     Head: Normocephalic.     Nose: Nose normal.  Eyes:     General: No scleral icterus.    Extraocular Movements: EOM normal.     Conjunctiva/sclera: Conjunctivae normal.  Neck:     Thyroid: No thyromegaly.  Cardiovascular:     Rate and Rhythm: Normal rate and regular rhythm.     Heart sounds: No murmur heard. No friction rub. No gallop.   Pulmonary:      Breath sounds: No stridor. No wheezing or rales.  Chest:     Chest wall: No tenderness.  Abdominal:     General: There is no distension.     Tenderness: There is no abdominal tenderness. There is no rebound.  Musculoskeletal:        General: No edema. Normal range of motion.     Cervical back: Neck supple.  Lymphadenopathy:     Cervical: No cervical adenopathy.  Skin:    Findings: No erythema or rash.  Neurological:     Mental Status: He is alert and oriented to person, place, and time.  Motor: No abnormal muscle tone.     Coordination: Coordination normal.  Psychiatric:        Mood and Affect: Mood and affect normal.        Behavior: Behavior normal.     ED Results / Procedures / Treatments   Labs (all labs ordered are listed, but only abnormal results are displayed) Labs Reviewed  BASIC METABOLIC PANEL - Abnormal; Notable for the following components:      Result Value   Glucose, Bld 119 (*)    Calcium 8.7 (*)    All other components within normal limits  CBC WITH DIFFERENTIAL/PLATELET    EKG None  Radiology No results found.  Procedures Procedures   Medications Ordered in ED Medications  lacosamide (VIMPAT) 100 mg in sodium chloride 0.9 % 25 mL IVPB (100 mg Intravenous New Bag/Given 08/23/20 1754)    ED Course  I have reviewed the triage vital signs and the nursing notes.  Pertinent labs & imaging results that were available during my care of the patient were reviewed by me and considered in my medical decision making (see chart for details).    MDM Rules/Calculators/A&P                          Patient with a seizure today.  Patient was given an extra Vimpat 100 mg and his labs were unremarkable.  He is back to normal.  I spoke to his relative and they will contact his neurologist to let him know that he had a seizure.  He will go back to the nursing home Final Clinical Impression(s) / ED Diagnoses Final diagnoses:  Seizure [facility])    Rx / DC  Orders ED Discharge Orders    None       Milton Ferguson, MD 08/23/20 [phone]

## 2020-08-23 NOTE — Discharge Instructions (Addendum)
Patient can be transferred to [facility] since he was on his way over there anyway

## 2020-08-24 DIAGNOSIS — E119 Type 2 diabetes mellitus without complications: Secondary | ICD-10-CM | POA: Diagnosis not present

## 2020-08-24 DIAGNOSIS — I10 Essential (primary) hypertension: Secondary | ICD-10-CM | POA: Diagnosis not present

## 2020-08-24 DIAGNOSIS — E785 Hyperlipidemia, unspecified: Secondary | ICD-10-CM | POA: Diagnosis not present

## 2020-08-24 DIAGNOSIS — M2550 Pain in unspecified joint: Secondary | ICD-10-CM | POA: Diagnosis not present

## 2020-08-24 DIAGNOSIS — E039 Hypothyroidism, unspecified: Secondary | ICD-10-CM | POA: Diagnosis not present

## 2020-08-24 DIAGNOSIS — R569 Unspecified convulsions: Secondary | ICD-10-CM | POA: Diagnosis not present

## 2020-08-24 DIAGNOSIS — Z7401 Bed confinement status: Secondary | ICD-10-CM | POA: Diagnosis not present

## 2020-08-24 DIAGNOSIS — R5381 Other malaise: Secondary | ICD-10-CM | POA: Diagnosis not present

## 2020-08-24 DIAGNOSIS — L97409 Non-pressure chronic ulcer of unspecified heel and midfoot with unspecified severity: Secondary | ICD-10-CM | POA: Diagnosis not present

## 2020-08-24 DIAGNOSIS — R269 Unspecified abnormalities of gait and mobility: Secondary | ICD-10-CM | POA: Diagnosis not present

## 2020-08-24 DIAGNOSIS — M4856XA Collapsed vertebra, not elsewhere classified, lumbar region, initial encounter for fracture: Secondary | ICD-10-CM | POA: Diagnosis not present

## 2020-08-24 DIAGNOSIS — R2689 Other abnormalities of gait and mobility: Secondary | ICD-10-CM | POA: Diagnosis not present

## 2020-08-24 DIAGNOSIS — Z7984 Long term (current) use of oral hypoglycemic drugs: Secondary | ICD-10-CM | POA: Diagnosis not present

## 2020-08-24 DIAGNOSIS — K219 Gastro-esophageal reflux disease without esophagitis: Secondary | ICD-10-CM | POA: Diagnosis not present

## 2020-08-24 DIAGNOSIS — R296 Repeated falls: Secondary | ICD-10-CM | POA: Diagnosis not present

## 2020-08-24 DIAGNOSIS — R278 Other lack of coordination: Secondary | ICD-10-CM | POA: Diagnosis not present

## 2020-08-24 DIAGNOSIS — S065X9A Traumatic subdural hemorrhage with loss of consciousness of unspecified duration, initial encounter: Secondary | ICD-10-CM | POA: Diagnosis not present

## 2020-08-24 DIAGNOSIS — W19XXXA Unspecified fall, initial encounter: Secondary | ICD-10-CM | POA: Diagnosis not present

## 2020-08-24 DIAGNOSIS — S32059D Unspecified fracture of fifth lumbar vertebra, subsequent encounter for fracture with routine healing: Secondary | ICD-10-CM | POA: Diagnosis not present

## 2020-08-24 DIAGNOSIS — G20 Parkinson's disease: Secondary | ICD-10-CM | POA: Diagnosis not present

## 2020-08-24 DIAGNOSIS — E11621 Type 2 diabetes mellitus with foot ulcer: Secondary | ICD-10-CM | POA: Diagnosis not present

## 2020-08-24 DIAGNOSIS — R410 Disorientation, unspecified: Secondary | ICD-10-CM | POA: Diagnosis not present

## 2020-08-24 DIAGNOSIS — Z79899 Other long term (current) drug therapy: Secondary | ICD-10-CM | POA: Diagnosis not present

## 2020-08-24 DIAGNOSIS — W19XXXD Unspecified fall, subsequent encounter: Secondary | ICD-10-CM | POA: Diagnosis not present

## 2020-08-24 DIAGNOSIS — I671 Cerebral aneurysm, nonruptured: Secondary | ICD-10-CM | POA: Diagnosis not present

## 2020-08-24 DIAGNOSIS — M6281 Muscle weakness (generalized): Secondary | ICD-10-CM | POA: Diagnosis not present

## 2020-08-24 DIAGNOSIS — S32050S Wedge compression fracture of fifth lumbar vertebra, sequela: Secondary | ICD-10-CM | POA: Diagnosis not present

## 2020-08-24 NOTE — ED Notes (Signed)
[facility] notified of transport, unknown ETA

## 2020-08-24 NOTE — ED Notes (Signed)
Hospital is out of Seizure pads for regular bed.Will Pad side rails with Blankets for Seizure precaution. Will continue to monitor.

## 2020-08-24 NOTE — Progress Notes (Signed)
100mg  Vimpat IVPB wasted in main RX stericycle bin  witnessed by [employer]

## 2020-08-24 NOTE — ED Notes (Signed)
RN called [facility] to pick pt up at 0830 but [facility] Stated we need to call 20-30 mins before pick up time. Will inform RN in A.M.

## 2020-08-24 NOTE — ED Notes (Signed)
Ritta Slot notified of patient arrival.

## 2020-08-25 DIAGNOSIS — I10 Essential (primary) hypertension: Secondary | ICD-10-CM | POA: Diagnosis not present

## 2020-08-25 DIAGNOSIS — E119 Type 2 diabetes mellitus without complications: Secondary | ICD-10-CM | POA: Diagnosis not present

## 2020-08-25 DIAGNOSIS — E039 Hypothyroidism, unspecified: Secondary | ICD-10-CM | POA: Diagnosis not present

## 2020-08-25 DIAGNOSIS — I671 Cerebral aneurysm, nonruptured: Secondary | ICD-10-CM | POA: Diagnosis not present

## 2020-08-25 DIAGNOSIS — S32050S Wedge compression fracture of fifth lumbar vertebra, sequela: Secondary | ICD-10-CM | POA: Diagnosis not present

## 2020-08-25 DIAGNOSIS — R269 Unspecified abnormalities of gait and mobility: Secondary | ICD-10-CM | POA: Diagnosis not present

## 2020-08-25 DIAGNOSIS — K219 Gastro-esophageal reflux disease without esophagitis: Secondary | ICD-10-CM | POA: Diagnosis not present

## 2020-08-25 DIAGNOSIS — G20 Parkinson's disease: Secondary | ICD-10-CM | POA: Diagnosis not present

## 2020-08-25 DIAGNOSIS — E785 Hyperlipidemia, unspecified: Secondary | ICD-10-CM | POA: Diagnosis not present

## 2020-08-25 DIAGNOSIS — R569 Unspecified convulsions: Secondary | ICD-10-CM | POA: Diagnosis not present

## 2020-08-25 DIAGNOSIS — W19XXXD Unspecified fall, subsequent encounter: Secondary | ICD-10-CM | POA: Diagnosis not present

## 2020-08-26 DIAGNOSIS — I10 Essential (primary) hypertension: Secondary | ICD-10-CM | POA: Diagnosis not present

## 2020-08-26 DIAGNOSIS — S32050S Wedge compression fracture of fifth lumbar vertebra, sequela: Secondary | ICD-10-CM | POA: Diagnosis not present

## 2020-08-26 DIAGNOSIS — R569 Unspecified convulsions: Secondary | ICD-10-CM | POA: Diagnosis not present

## 2020-08-26 DIAGNOSIS — G20 Parkinson's disease: Secondary | ICD-10-CM | POA: Diagnosis not present

## 2020-08-29 DIAGNOSIS — E119 Type 2 diabetes mellitus without complications: Secondary | ICD-10-CM | POA: Diagnosis not present

## 2020-08-29 DIAGNOSIS — S32050S Wedge compression fracture of fifth lumbar vertebra, sequela: Secondary | ICD-10-CM | POA: Diagnosis not present

## 2020-08-29 DIAGNOSIS — R569 Unspecified convulsions: Secondary | ICD-10-CM | POA: Diagnosis not present

## 2020-08-29 DIAGNOSIS — R269 Unspecified abnormalities of gait and mobility: Secondary | ICD-10-CM | POA: Diagnosis not present

## 2020-08-29 DIAGNOSIS — G20 Parkinson's disease: Secondary | ICD-10-CM | POA: Diagnosis not present

## 2020-08-29 DIAGNOSIS — W19XXXD Unspecified fall, subsequent encounter: Secondary | ICD-10-CM | POA: Diagnosis not present

## 2020-08-29 DIAGNOSIS — I671 Cerebral aneurysm, nonruptured: Secondary | ICD-10-CM | POA: Diagnosis not present

## 2020-09-02 ENCOUNTER — Other Ambulatory Visit: Payer: Self-pay | Admitting: Adult Health

## 2020-09-08 ENCOUNTER — Other Ambulatory Visit: Payer: Self-pay | Admitting: Adult Health

## 2020-09-08 ENCOUNTER — Telehealth: Payer: Self-pay | Admitting: Adult Health

## 2020-09-08 NOTE — Telephone Encounter (Signed)
Pt son [name] call and want a call back to talk about sending pt some Home Health and want a call back at [phone] .

## 2020-09-08 NOTE — Telephone Encounter (Signed)
Spoke with [name], he stated Gerald Leach is in a rehab facility and is being released tomorrow. They are asking to have a home health nurse come out a few hours a day to help with medications and daily care.

## 2020-09-08 NOTE — Telephone Encounter (Signed)
Need to know what services they would like

## 2020-09-09 DIAGNOSIS — R269 Unspecified abnormalities of gait and mobility: Secondary | ICD-10-CM | POA: Diagnosis not present

## 2020-09-09 DIAGNOSIS — S32050S Wedge compression fracture of fifth lumbar vertebra, sequela: Secondary | ICD-10-CM | POA: Diagnosis not present

## 2020-09-09 DIAGNOSIS — I10 Essential (primary) hypertension: Secondary | ICD-10-CM | POA: Diagnosis not present

## 2020-09-09 DIAGNOSIS — G20 Parkinson's disease: Secondary | ICD-10-CM | POA: Diagnosis not present

## 2020-09-13 DIAGNOSIS — I10 Essential (primary) hypertension: Secondary | ICD-10-CM | POA: Diagnosis not present

## 2020-09-13 DIAGNOSIS — W19XXXD Unspecified fall, subsequent encounter: Secondary | ICD-10-CM | POA: Diagnosis not present

## 2020-09-13 DIAGNOSIS — R569 Unspecified convulsions: Secondary | ICD-10-CM | POA: Diagnosis not present

## 2020-09-13 DIAGNOSIS — S32050S Wedge compression fracture of fifth lumbar vertebra, sequela: Secondary | ICD-10-CM | POA: Diagnosis not present

## 2020-09-13 DIAGNOSIS — G20 Parkinson's disease: Secondary | ICD-10-CM | POA: Diagnosis not present

## 2020-09-13 DIAGNOSIS — R269 Unspecified abnormalities of gait and mobility: Secondary | ICD-10-CM | POA: Diagnosis not present

## 2020-09-14 ENCOUNTER — Telehealth: Payer: Self-pay | Admitting: Adult Health

## 2020-09-14 NOTE — Telephone Encounter (Signed)
Pts son is calling in wanting to see if a pre-authorization for home health care to go to his insurance [facility]) due to the pt will be discharged on 09/16/2020 from the [facility] ([facility]).  Pt has an appointment on 09/20/2020 with [facility] for a [place].

## 2020-09-14 NOTE — Telephone Encounter (Signed)
Does this just start as a referral for home health?

## 2020-09-15 ENCOUNTER — Telehealth: Payer: Self-pay | Admitting: Adult Health

## 2020-09-15 NOTE — Telephone Encounter (Signed)
Left a detailed message at the pts son's cell number with the information below and this can be discussed at the visit on 4/5.

## 2020-09-15 NOTE — Telephone Encounter (Signed)
Pt son would like corey to give him a call

## 2020-09-15 NOTE — Telephone Encounter (Signed)
Cannot order home health until he is seen.

## 2020-09-20 ENCOUNTER — Inpatient Hospital Stay: Payer: Medicare Other | Admitting: Adult Health

## 2020-09-22 DIAGNOSIS — R269 Unspecified abnormalities of gait and mobility: Secondary | ICD-10-CM | POA: Diagnosis not present

## 2020-09-22 DIAGNOSIS — S32050S Wedge compression fracture of fifth lumbar vertebra, sequela: Secondary | ICD-10-CM | POA: Diagnosis not present

## 2020-09-22 DIAGNOSIS — I10 Essential (primary) hypertension: Secondary | ICD-10-CM | POA: Diagnosis not present

## 2020-09-22 DIAGNOSIS — G20 Parkinson's disease: Secondary | ICD-10-CM | POA: Diagnosis not present

## 2020-09-26 DIAGNOSIS — R569 Unspecified convulsions: Secondary | ICD-10-CM | POA: Diagnosis not present

## 2020-09-26 DIAGNOSIS — G20 Parkinson's disease: Secondary | ICD-10-CM | POA: Diagnosis not present

## 2020-09-26 DIAGNOSIS — S32050S Wedge compression fracture of fifth lumbar vertebra, sequela: Secondary | ICD-10-CM | POA: Diagnosis not present

## 2020-09-26 DIAGNOSIS — I10 Essential (primary) hypertension: Secondary | ICD-10-CM | POA: Diagnosis not present

## 2020-09-28 ENCOUNTER — Ambulatory Visit: Payer: Medicare Other | Admitting: Adult Health

## 2020-09-28 DIAGNOSIS — Z0289 Encounter for other administrative examinations: Secondary | ICD-10-CM

## 2020-09-28 NOTE — Progress Notes (Deleted)
Subjective:    [name], male    DOB: 1933-09-26, 87 y.o.   MRN: [id number]  HPI 87 year old male who  has a past medical history of Brain aneurysm, Colonic polyp (2003), Dementia ([place]), Diabetes mellitus without complication ([place]), ED (erectile dysfunction), GERD (gastroesophageal reflux disease), Hyperlipidemia, Hypertension, Hypothyroidism, Seizures ([place]), and Seizures ([place]).  He presents to the office today for follow-up after recent hospital admission.  He was admitted on 08/11/2020 and discharged to skilled nursing facility on 08/16/2020.  He was originally seen in this office prior to going to the emergency room for progressive weakness and multiple falls during that week.  Most of the time the patient can get up and asked to stay down on the ground for a while until family member arrives.  He is unable to take care of himself.  While in the emergency room his work-up showed an L5 compression fracture.  Neurosurgery was consulted and recommended supportive care.   Review of Systems See HPI   Past Medical History:  Diagnosis Date  . Brain aneurysm   . Colonic polyp 2003  . Dementia ([place])   . Diabetes mellitus without complication ([place])   . ED (erectile dysfunction)   . GERD (gastroesophageal reflux disease)   . Hyperlipidemia   . Hypertension   . Hypothyroidism   . Seizures ([place])    per family  . Seizures ([place])     Social History   Socioeconomic History  . Marital status: Married    Spouse name: Not on file  . Number of children: 2  . Years of education: Not on file  . Highest education level: Not on file  Occupational History  . Occupation: retired  Tobacco Use  . Smoking status: Never Smoker  . Smokeless tobacco: Never Used  Vaping Use  . Vaping Use: Never used  Substance and Sexual Activity  . Alcohol use: Yes    Alcohol/week: 0.0 standard drinks    Comment: rum mixed in diet coke 3 a week  . Drug use: Yes    Types: Nitrous oxide  .  Sexual activity: Not on file  Other Topics Concern  . Not on file  Social History Narrative   ** Merged History Encounter **       Lives with wife in a 2 story home.  Has no trouble with the stairs as long as he holds on to the banister.  Education: college.  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     Social Determinants of Health   Financial Resource Strain: Not on file  Food Insecurity: Not on file  Transportation Needs: Not on file  Physical Activity: Not on file  Stress: Not on file  Social Connections: Not on file  Intimate Partner Violence: Not on file    Past Surgical History:  Procedure Laterality Date  . Aneurysmal clipping    . brain aneurysm surgery     at [facility]  . carotid artery aneurysm    . COLONOSCOPY     polyps  . CRANIOTOMY Right 09/19/2012   Procedure: CRANIOTOMY HEMATOMA EVACUATION SUBDURAL;  Surgeon: Otilio Connors, MD;  Location: [facility] NEURO ORS;  Service: Neurosurgery;  Laterality: Right;  . CRANIOTOMY Right 09/22/2012   Procedure: CRANIOTOMY HEMATOMA EVACUATION SUBDURAL;  Surgeon: Otilio Connors, MD;  Location: [facility] NEURO ORS;  Service: Neurosurgery;  Laterality: Right;  . Craniotomy.    . right hernia  Family History  Problem Relation Age of Onset  . Liver disease Brother        Died  . Seizures Neg Hx     No Known Allergies  Current Outpatient Medications on File Prior to Visit  Medication Sig Dispense Refill  . amantadine (SYMMETREL) 100 MG capsule TAKE 1 CAPSULE BY MOUTH EVERY DAY (Patient taking differently: Take 100 mg by mouth daily.) 90 capsule 2  . carbidopa-levodopa (SINEMET IR) 25-100 MG tablet Take 1 tablet three times daily (Patient taking differently: Take 1 tablet by mouth 2 (two) times daily.) 270 tablet 3  . levETIRAcetam (KEPPRA) 750 MG tablet TAKE 2 TABLETS (1,500 MG TOTAL) BY MOUTH 2 (TWO) TIMES DAILY. 360 tablet 3  . levothyroxine (SYNTHROID) 75 MCG tablet TAKE 1 TABLET BY MOUTH EVERY DAY (Patient  taking differently: Take 75 mcg by mouth daily before breakfast.) 90 tablet 1  . metFORMIN (GLUCOPHAGE) 500 MG tablet TAKE 1 TABLET (500 MG TOTAL) BY MOUTH 2 (TWO) TIMES DAILY WITH A MEAL. NEED APPOINTMENT 180 tablet 0  . VIMPAT 100 MG TABS TAKE 1 TABLET BY MOUTH TWICE A DAY (Patient taking differently: Take 100 mg by mouth 2 (two) times daily.) 60 tablet 5   No current facility-administered medications on file prior to visit.    There were no vitals taken for this visit.      Objective:   Physical Exam Vitals and nursing note reviewed.  Cardiovascular:     Rate and Rhythm: Normal rate and regular rhythm.     Pulses: Normal pulses.     Heart sounds: Normal heart sounds.  Pulmonary:     Effort: Pulmonary effort is normal.     Breath sounds: Normal breath sounds.           Assessment & Plan:

## 2020-09-29 DIAGNOSIS — R569 Unspecified convulsions: Secondary | ICD-10-CM | POA: Diagnosis not present

## 2020-09-29 DIAGNOSIS — Z7984 Long term (current) use of oral hypoglycemic drugs: Secondary | ICD-10-CM | POA: Diagnosis not present

## 2020-09-29 DIAGNOSIS — I10 Essential (primary) hypertension: Secondary | ICD-10-CM | POA: Diagnosis not present

## 2020-09-29 DIAGNOSIS — E039 Hypothyroidism, unspecified: Secondary | ICD-10-CM | POA: Diagnosis not present

## 2020-09-29 DIAGNOSIS — G20 Parkinson's disease: Secondary | ICD-10-CM | POA: Diagnosis not present

## 2020-09-29 DIAGNOSIS — E119 Type 2 diabetes mellitus without complications: Secondary | ICD-10-CM | POA: Diagnosis not present

## 2020-09-29 DIAGNOSIS — K219 Gastro-esophageal reflux disease without esophagitis: Secondary | ICD-10-CM | POA: Diagnosis not present

## 2020-09-29 DIAGNOSIS — E785 Hyperlipidemia, unspecified: Secondary | ICD-10-CM | POA: Diagnosis not present

## 2020-09-29 DIAGNOSIS — Z9181 History of falling: Secondary | ICD-10-CM | POA: Diagnosis not present

## 2020-09-29 DIAGNOSIS — S32050D Wedge compression fracture of fifth lumbar vertebra, subsequent encounter for fracture with routine healing: Secondary | ICD-10-CM | POA: Diagnosis not present

## 2020-09-30 DIAGNOSIS — I10 Essential (primary) hypertension: Secondary | ICD-10-CM | POA: Diagnosis not present

## 2020-09-30 DIAGNOSIS — E119 Type 2 diabetes mellitus without complications: Secondary | ICD-10-CM | POA: Diagnosis not present

## 2020-09-30 DIAGNOSIS — G20 Parkinson's disease: Secondary | ICD-10-CM | POA: Diagnosis not present

## 2020-09-30 DIAGNOSIS — S32050D Wedge compression fracture of fifth lumbar vertebra, subsequent encounter for fracture with routine healing: Secondary | ICD-10-CM | POA: Diagnosis not present

## 2020-09-30 DIAGNOSIS — E785 Hyperlipidemia, unspecified: Secondary | ICD-10-CM | POA: Diagnosis not present

## 2020-09-30 DIAGNOSIS — R569 Unspecified convulsions: Secondary | ICD-10-CM | POA: Diagnosis not present

## 2020-09-30 DIAGNOSIS — Z9181 History of falling: Secondary | ICD-10-CM | POA: Diagnosis not present

## 2020-09-30 DIAGNOSIS — Z7984 Long term (current) use of oral hypoglycemic drugs: Secondary | ICD-10-CM | POA: Diagnosis not present

## 2020-09-30 DIAGNOSIS — K219 Gastro-esophageal reflux disease without esophagitis: Secondary | ICD-10-CM | POA: Diagnosis not present

## 2020-09-30 DIAGNOSIS — E039 Hypothyroidism, unspecified: Secondary | ICD-10-CM | POA: Diagnosis not present

## 2020-10-03 NOTE — Telephone Encounter (Signed)
[facility] Terri Piedra [phone]) would like verbal orders for home PT and Physical therapy  as follow one time a week for one week Two times a week for four weeks One time week for four weeks

## 2020-10-04 NOTE — Telephone Encounter (Signed)
Verbal orders ok

## 2020-10-04 NOTE — Telephone Encounter (Signed)
Spoke with Gerald Leach and informed her of the approval for orders as below.

## 2020-10-05 DIAGNOSIS — K219 Gastro-esophageal reflux disease without esophagitis: Secondary | ICD-10-CM | POA: Diagnosis not present

## 2020-10-05 DIAGNOSIS — Z7984 Long term (current) use of oral hypoglycemic drugs: Secondary | ICD-10-CM | POA: Diagnosis not present

## 2020-10-05 DIAGNOSIS — G20 Parkinson's disease: Secondary | ICD-10-CM | POA: Diagnosis not present

## 2020-10-05 DIAGNOSIS — E785 Hyperlipidemia, unspecified: Secondary | ICD-10-CM | POA: Diagnosis not present

## 2020-10-05 DIAGNOSIS — E039 Hypothyroidism, unspecified: Secondary | ICD-10-CM | POA: Diagnosis not present

## 2020-10-05 DIAGNOSIS — R569 Unspecified convulsions: Secondary | ICD-10-CM | POA: Diagnosis not present

## 2020-10-05 DIAGNOSIS — Z9181 History of falling: Secondary | ICD-10-CM | POA: Diagnosis not present

## 2020-10-05 DIAGNOSIS — S32050D Wedge compression fracture of fifth lumbar vertebra, subsequent encounter for fracture with routine healing: Secondary | ICD-10-CM | POA: Diagnosis not present

## 2020-10-05 DIAGNOSIS — E119 Type 2 diabetes mellitus without complications: Secondary | ICD-10-CM | POA: Diagnosis not present

## 2020-10-05 DIAGNOSIS — I10 Essential (primary) hypertension: Secondary | ICD-10-CM | POA: Diagnosis not present

## 2020-10-06 DIAGNOSIS — K219 Gastro-esophageal reflux disease without esophagitis: Secondary | ICD-10-CM | POA: Diagnosis not present

## 2020-10-06 DIAGNOSIS — E039 Hypothyroidism, unspecified: Secondary | ICD-10-CM | POA: Diagnosis not present

## 2020-10-06 DIAGNOSIS — I10 Essential (primary) hypertension: Secondary | ICD-10-CM | POA: Diagnosis not present

## 2020-10-06 DIAGNOSIS — E785 Hyperlipidemia, unspecified: Secondary | ICD-10-CM | POA: Diagnosis not present

## 2020-10-06 DIAGNOSIS — E119 Type 2 diabetes mellitus without complications: Secondary | ICD-10-CM | POA: Diagnosis not present

## 2020-10-06 DIAGNOSIS — R569 Unspecified convulsions: Secondary | ICD-10-CM | POA: Diagnosis not present

## 2020-10-06 DIAGNOSIS — S32050D Wedge compression fracture of fifth lumbar vertebra, subsequent encounter for fracture with routine healing: Secondary | ICD-10-CM | POA: Diagnosis not present

## 2020-10-06 DIAGNOSIS — G20 Parkinson's disease: Secondary | ICD-10-CM | POA: Diagnosis not present

## 2020-10-06 DIAGNOSIS — Z7984 Long term (current) use of oral hypoglycemic drugs: Secondary | ICD-10-CM | POA: Diagnosis not present

## 2020-10-06 DIAGNOSIS — Z9181 History of falling: Secondary | ICD-10-CM | POA: Diagnosis not present

## 2020-10-07 DIAGNOSIS — Z7984 Long term (current) use of oral hypoglycemic drugs: Secondary | ICD-10-CM | POA: Diagnosis not present

## 2020-10-07 DIAGNOSIS — R569 Unspecified convulsions: Secondary | ICD-10-CM | POA: Diagnosis not present

## 2020-10-07 DIAGNOSIS — S32050D Wedge compression fracture of fifth lumbar vertebra, subsequent encounter for fracture with routine healing: Secondary | ICD-10-CM | POA: Diagnosis not present

## 2020-10-07 DIAGNOSIS — Z9181 History of falling: Secondary | ICD-10-CM | POA: Diagnosis not present

## 2020-10-07 DIAGNOSIS — K219 Gastro-esophageal reflux disease without esophagitis: Secondary | ICD-10-CM | POA: Diagnosis not present

## 2020-10-07 DIAGNOSIS — E039 Hypothyroidism, unspecified: Secondary | ICD-10-CM | POA: Diagnosis not present

## 2020-10-07 DIAGNOSIS — G20 Parkinson's disease: Secondary | ICD-10-CM | POA: Diagnosis not present

## 2020-10-07 DIAGNOSIS — E119 Type 2 diabetes mellitus without complications: Secondary | ICD-10-CM | POA: Diagnosis not present

## 2020-10-07 DIAGNOSIS — I10 Essential (primary) hypertension: Secondary | ICD-10-CM | POA: Diagnosis not present

## 2020-10-07 DIAGNOSIS — E785 Hyperlipidemia, unspecified: Secondary | ICD-10-CM | POA: Diagnosis not present

## 2020-10-10 DIAGNOSIS — I10 Essential (primary) hypertension: Secondary | ICD-10-CM | POA: Diagnosis not present

## 2020-10-10 DIAGNOSIS — E039 Hypothyroidism, unspecified: Secondary | ICD-10-CM | POA: Diagnosis not present

## 2020-10-10 DIAGNOSIS — Z7984 Long term (current) use of oral hypoglycemic drugs: Secondary | ICD-10-CM | POA: Diagnosis not present

## 2020-10-10 DIAGNOSIS — E785 Hyperlipidemia, unspecified: Secondary | ICD-10-CM | POA: Diagnosis not present

## 2020-10-10 DIAGNOSIS — S32050D Wedge compression fracture of fifth lumbar vertebra, subsequent encounter for fracture with routine healing: Secondary | ICD-10-CM | POA: Diagnosis not present

## 2020-10-10 DIAGNOSIS — G20 Parkinson's disease: Secondary | ICD-10-CM | POA: Diagnosis not present

## 2020-10-10 DIAGNOSIS — Z9181 History of falling: Secondary | ICD-10-CM | POA: Diagnosis not present

## 2020-10-10 DIAGNOSIS — E119 Type 2 diabetes mellitus without complications: Secondary | ICD-10-CM | POA: Diagnosis not present

## 2020-10-10 DIAGNOSIS — K219 Gastro-esophageal reflux disease without esophagitis: Secondary | ICD-10-CM | POA: Diagnosis not present

## 2020-10-10 DIAGNOSIS — R569 Unspecified convulsions: Secondary | ICD-10-CM | POA: Diagnosis not present

## 2020-10-11 DIAGNOSIS — E119 Type 2 diabetes mellitus without complications: Secondary | ICD-10-CM | POA: Diagnosis not present

## 2020-10-11 DIAGNOSIS — S32050D Wedge compression fracture of fifth lumbar vertebra, subsequent encounter for fracture with routine healing: Secondary | ICD-10-CM | POA: Diagnosis not present

## 2020-10-11 DIAGNOSIS — E785 Hyperlipidemia, unspecified: Secondary | ICD-10-CM | POA: Diagnosis not present

## 2020-10-11 DIAGNOSIS — Z9181 History of falling: Secondary | ICD-10-CM | POA: Diagnosis not present

## 2020-10-11 DIAGNOSIS — E039 Hypothyroidism, unspecified: Secondary | ICD-10-CM | POA: Diagnosis not present

## 2020-10-11 DIAGNOSIS — I10 Essential (primary) hypertension: Secondary | ICD-10-CM | POA: Diagnosis not present

## 2020-10-11 DIAGNOSIS — Z7984 Long term (current) use of oral hypoglycemic drugs: Secondary | ICD-10-CM | POA: Diagnosis not present

## 2020-10-11 DIAGNOSIS — R569 Unspecified convulsions: Secondary | ICD-10-CM | POA: Diagnosis not present

## 2020-10-11 DIAGNOSIS — K219 Gastro-esophageal reflux disease without esophagitis: Secondary | ICD-10-CM | POA: Diagnosis not present

## 2020-10-11 DIAGNOSIS — G20 Parkinson's disease: Secondary | ICD-10-CM | POA: Diagnosis not present

## 2020-10-12 DIAGNOSIS — G20 Parkinson's disease: Secondary | ICD-10-CM | POA: Diagnosis not present

## 2020-10-12 DIAGNOSIS — S32050D Wedge compression fracture of fifth lumbar vertebra, subsequent encounter for fracture with routine healing: Secondary | ICD-10-CM | POA: Diagnosis not present

## 2020-10-12 DIAGNOSIS — K219 Gastro-esophageal reflux disease without esophagitis: Secondary | ICD-10-CM | POA: Diagnosis not present

## 2020-10-12 DIAGNOSIS — Z7984 Long term (current) use of oral hypoglycemic drugs: Secondary | ICD-10-CM | POA: Diagnosis not present

## 2020-10-12 DIAGNOSIS — R569 Unspecified convulsions: Secondary | ICD-10-CM | POA: Diagnosis not present

## 2020-10-12 DIAGNOSIS — E785 Hyperlipidemia, unspecified: Secondary | ICD-10-CM | POA: Diagnosis not present

## 2020-10-12 DIAGNOSIS — I10 Essential (primary) hypertension: Secondary | ICD-10-CM | POA: Diagnosis not present

## 2020-10-12 DIAGNOSIS — Z9181 History of falling: Secondary | ICD-10-CM | POA: Diagnosis not present

## 2020-10-12 DIAGNOSIS — E119 Type 2 diabetes mellitus without complications: Secondary | ICD-10-CM | POA: Diagnosis not present

## 2020-10-12 DIAGNOSIS — E039 Hypothyroidism, unspecified: Secondary | ICD-10-CM | POA: Diagnosis not present

## 2020-10-13 DIAGNOSIS — G20 Parkinson's disease: Secondary | ICD-10-CM | POA: Diagnosis not present

## 2020-10-13 DIAGNOSIS — E119 Type 2 diabetes mellitus without complications: Secondary | ICD-10-CM | POA: Diagnosis not present

## 2020-10-13 DIAGNOSIS — S32050D Wedge compression fracture of fifth lumbar vertebra, subsequent encounter for fracture with routine healing: Secondary | ICD-10-CM | POA: Diagnosis not present

## 2020-10-13 DIAGNOSIS — I10 Essential (primary) hypertension: Secondary | ICD-10-CM | POA: Diagnosis not present

## 2020-10-13 DIAGNOSIS — K219 Gastro-esophageal reflux disease without esophagitis: Secondary | ICD-10-CM | POA: Diagnosis not present

## 2020-10-13 DIAGNOSIS — E039 Hypothyroidism, unspecified: Secondary | ICD-10-CM | POA: Diagnosis not present

## 2020-10-13 DIAGNOSIS — R569 Unspecified convulsions: Secondary | ICD-10-CM | POA: Diagnosis not present

## 2020-10-13 DIAGNOSIS — Z7984 Long term (current) use of oral hypoglycemic drugs: Secondary | ICD-10-CM | POA: Diagnosis not present

## 2020-10-13 DIAGNOSIS — E785 Hyperlipidemia, unspecified: Secondary | ICD-10-CM | POA: Diagnosis not present

## 2020-10-13 DIAGNOSIS — Z9181 History of falling: Secondary | ICD-10-CM | POA: Diagnosis not present

## 2020-10-17 DIAGNOSIS — E119 Type 2 diabetes mellitus without complications: Secondary | ICD-10-CM | POA: Diagnosis not present

## 2020-10-17 DIAGNOSIS — Z7984 Long term (current) use of oral hypoglycemic drugs: Secondary | ICD-10-CM | POA: Diagnosis not present

## 2020-10-17 DIAGNOSIS — R569 Unspecified convulsions: Secondary | ICD-10-CM | POA: Diagnosis not present

## 2020-10-17 DIAGNOSIS — I10 Essential (primary) hypertension: Secondary | ICD-10-CM | POA: Diagnosis not present

## 2020-10-17 DIAGNOSIS — K219 Gastro-esophageal reflux disease without esophagitis: Secondary | ICD-10-CM | POA: Diagnosis not present

## 2020-10-17 DIAGNOSIS — G20 Parkinson's disease: Secondary | ICD-10-CM | POA: Diagnosis not present

## 2020-10-17 DIAGNOSIS — E039 Hypothyroidism, unspecified: Secondary | ICD-10-CM | POA: Diagnosis not present

## 2020-10-17 DIAGNOSIS — Z9181 History of falling: Secondary | ICD-10-CM | POA: Diagnosis not present

## 2020-10-17 DIAGNOSIS — E785 Hyperlipidemia, unspecified: Secondary | ICD-10-CM | POA: Diagnosis not present

## 2020-10-17 DIAGNOSIS — S32050D Wedge compression fracture of fifth lumbar vertebra, subsequent encounter for fracture with routine healing: Secondary | ICD-10-CM | POA: Diagnosis not present

## 2020-10-18 DIAGNOSIS — E039 Hypothyroidism, unspecified: Secondary | ICD-10-CM | POA: Diagnosis not present

## 2020-10-18 DIAGNOSIS — R569 Unspecified convulsions: Secondary | ICD-10-CM | POA: Diagnosis not present

## 2020-10-18 DIAGNOSIS — Z9181 History of falling: Secondary | ICD-10-CM | POA: Diagnosis not present

## 2020-10-18 DIAGNOSIS — K219 Gastro-esophageal reflux disease without esophagitis: Secondary | ICD-10-CM | POA: Diagnosis not present

## 2020-10-18 DIAGNOSIS — S32050D Wedge compression fracture of fifth lumbar vertebra, subsequent encounter for fracture with routine healing: Secondary | ICD-10-CM | POA: Diagnosis not present

## 2020-10-18 DIAGNOSIS — E785 Hyperlipidemia, unspecified: Secondary | ICD-10-CM | POA: Diagnosis not present

## 2020-10-18 DIAGNOSIS — Z7984 Long term (current) use of oral hypoglycemic drugs: Secondary | ICD-10-CM | POA: Diagnosis not present

## 2020-10-18 DIAGNOSIS — E119 Type 2 diabetes mellitus without complications: Secondary | ICD-10-CM | POA: Diagnosis not present

## 2020-10-18 DIAGNOSIS — G20 Parkinson's disease: Secondary | ICD-10-CM | POA: Diagnosis not present

## 2020-10-18 DIAGNOSIS — I10 Essential (primary) hypertension: Secondary | ICD-10-CM | POA: Diagnosis not present

## 2020-10-19 DIAGNOSIS — E039 Hypothyroidism, unspecified: Secondary | ICD-10-CM | POA: Diagnosis not present

## 2020-10-19 DIAGNOSIS — S32050D Wedge compression fracture of fifth lumbar vertebra, subsequent encounter for fracture with routine healing: Secondary | ICD-10-CM | POA: Diagnosis not present

## 2020-10-19 DIAGNOSIS — G20 Parkinson's disease: Secondary | ICD-10-CM | POA: Diagnosis not present

## 2020-10-19 DIAGNOSIS — Z7984 Long term (current) use of oral hypoglycemic drugs: Secondary | ICD-10-CM | POA: Diagnosis not present

## 2020-10-19 DIAGNOSIS — E785 Hyperlipidemia, unspecified: Secondary | ICD-10-CM | POA: Diagnosis not present

## 2020-10-19 DIAGNOSIS — K219 Gastro-esophageal reflux disease without esophagitis: Secondary | ICD-10-CM | POA: Diagnosis not present

## 2020-10-19 DIAGNOSIS — R569 Unspecified convulsions: Secondary | ICD-10-CM | POA: Diagnosis not present

## 2020-10-19 DIAGNOSIS — Z9181 History of falling: Secondary | ICD-10-CM | POA: Diagnosis not present

## 2020-10-19 DIAGNOSIS — E119 Type 2 diabetes mellitus without complications: Secondary | ICD-10-CM | POA: Diagnosis not present

## 2020-10-19 DIAGNOSIS — I10 Essential (primary) hypertension: Secondary | ICD-10-CM | POA: Diagnosis not present

## 2020-10-20 DIAGNOSIS — Z9181 History of falling: Secondary | ICD-10-CM | POA: Diagnosis not present

## 2020-10-20 DIAGNOSIS — G20 Parkinson's disease: Secondary | ICD-10-CM | POA: Diagnosis not present

## 2020-10-20 DIAGNOSIS — R569 Unspecified convulsions: Secondary | ICD-10-CM | POA: Diagnosis not present

## 2020-10-20 DIAGNOSIS — K219 Gastro-esophageal reflux disease without esophagitis: Secondary | ICD-10-CM | POA: Diagnosis not present

## 2020-10-20 DIAGNOSIS — E119 Type 2 diabetes mellitus without complications: Secondary | ICD-10-CM | POA: Diagnosis not present

## 2020-10-20 DIAGNOSIS — S32050D Wedge compression fracture of fifth lumbar vertebra, subsequent encounter for fracture with routine healing: Secondary | ICD-10-CM | POA: Diagnosis not present

## 2020-10-20 DIAGNOSIS — E785 Hyperlipidemia, unspecified: Secondary | ICD-10-CM | POA: Diagnosis not present

## 2020-10-20 DIAGNOSIS — I10 Essential (primary) hypertension: Secondary | ICD-10-CM | POA: Diagnosis not present

## 2020-10-20 DIAGNOSIS — Z7984 Long term (current) use of oral hypoglycemic drugs: Secondary | ICD-10-CM | POA: Diagnosis not present

## 2020-10-20 DIAGNOSIS — E039 Hypothyroidism, unspecified: Secondary | ICD-10-CM | POA: Diagnosis not present

## 2020-10-24 DIAGNOSIS — R569 Unspecified convulsions: Secondary | ICD-10-CM | POA: Diagnosis not present

## 2020-10-24 DIAGNOSIS — E119 Type 2 diabetes mellitus without complications: Secondary | ICD-10-CM | POA: Diagnosis not present

## 2020-10-24 DIAGNOSIS — S32050D Wedge compression fracture of fifth lumbar vertebra, subsequent encounter for fracture with routine healing: Secondary | ICD-10-CM | POA: Diagnosis not present

## 2020-10-24 DIAGNOSIS — K219 Gastro-esophageal reflux disease without esophagitis: Secondary | ICD-10-CM | POA: Diagnosis not present

## 2020-10-24 DIAGNOSIS — E785 Hyperlipidemia, unspecified: Secondary | ICD-10-CM | POA: Diagnosis not present

## 2020-10-24 DIAGNOSIS — G20 Parkinson's disease: Secondary | ICD-10-CM | POA: Diagnosis not present

## 2020-10-24 DIAGNOSIS — Z7984 Long term (current) use of oral hypoglycemic drugs: Secondary | ICD-10-CM | POA: Diagnosis not present

## 2020-10-24 DIAGNOSIS — I10 Essential (primary) hypertension: Secondary | ICD-10-CM | POA: Diagnosis not present

## 2020-10-24 DIAGNOSIS — Z9181 History of falling: Secondary | ICD-10-CM | POA: Diagnosis not present

## 2020-10-24 DIAGNOSIS — E039 Hypothyroidism, unspecified: Secondary | ICD-10-CM | POA: Diagnosis not present

## 2020-10-25 DIAGNOSIS — R569 Unspecified convulsions: Secondary | ICD-10-CM | POA: Diagnosis not present

## 2020-10-25 DIAGNOSIS — K219 Gastro-esophageal reflux disease without esophagitis: Secondary | ICD-10-CM | POA: Diagnosis not present

## 2020-10-25 DIAGNOSIS — Z9181 History of falling: Secondary | ICD-10-CM | POA: Diagnosis not present

## 2020-10-25 DIAGNOSIS — S32050D Wedge compression fracture of fifth lumbar vertebra, subsequent encounter for fracture with routine healing: Secondary | ICD-10-CM | POA: Diagnosis not present

## 2020-10-25 DIAGNOSIS — E119 Type 2 diabetes mellitus without complications: Secondary | ICD-10-CM | POA: Diagnosis not present

## 2020-10-25 DIAGNOSIS — G20 Parkinson's disease: Secondary | ICD-10-CM | POA: Diagnosis not present

## 2020-10-25 DIAGNOSIS — Z7984 Long term (current) use of oral hypoglycemic drugs: Secondary | ICD-10-CM | POA: Diagnosis not present

## 2020-10-25 DIAGNOSIS — E039 Hypothyroidism, unspecified: Secondary | ICD-10-CM | POA: Diagnosis not present

## 2020-10-25 DIAGNOSIS — E785 Hyperlipidemia, unspecified: Secondary | ICD-10-CM | POA: Diagnosis not present

## 2020-10-25 DIAGNOSIS — I10 Essential (primary) hypertension: Secondary | ICD-10-CM | POA: Diagnosis not present

## 2020-10-28 DIAGNOSIS — E785 Hyperlipidemia, unspecified: Secondary | ICD-10-CM | POA: Diagnosis not present

## 2020-10-28 DIAGNOSIS — I10 Essential (primary) hypertension: Secondary | ICD-10-CM | POA: Diagnosis not present

## 2020-10-28 DIAGNOSIS — Z9181 History of falling: Secondary | ICD-10-CM | POA: Diagnosis not present

## 2020-10-28 DIAGNOSIS — K219 Gastro-esophageal reflux disease without esophagitis: Secondary | ICD-10-CM | POA: Diagnosis not present

## 2020-10-28 DIAGNOSIS — S32050D Wedge compression fracture of fifth lumbar vertebra, subsequent encounter for fracture with routine healing: Secondary | ICD-10-CM | POA: Diagnosis not present

## 2020-10-28 DIAGNOSIS — R569 Unspecified convulsions: Secondary | ICD-10-CM | POA: Diagnosis not present

## 2020-10-28 DIAGNOSIS — E039 Hypothyroidism, unspecified: Secondary | ICD-10-CM | POA: Diagnosis not present

## 2020-10-28 DIAGNOSIS — Z7984 Long term (current) use of oral hypoglycemic drugs: Secondary | ICD-10-CM | POA: Diagnosis not present

## 2020-10-28 DIAGNOSIS — E119 Type 2 diabetes mellitus without complications: Secondary | ICD-10-CM | POA: Diagnosis not present

## 2020-10-28 DIAGNOSIS — G20 Parkinson's disease: Secondary | ICD-10-CM | POA: Diagnosis not present

## 2020-10-31 DIAGNOSIS — Z7984 Long term (current) use of oral hypoglycemic drugs: Secondary | ICD-10-CM | POA: Diagnosis not present

## 2020-10-31 DIAGNOSIS — K219 Gastro-esophageal reflux disease without esophagitis: Secondary | ICD-10-CM | POA: Diagnosis not present

## 2020-10-31 DIAGNOSIS — E039 Hypothyroidism, unspecified: Secondary | ICD-10-CM | POA: Diagnosis not present

## 2020-10-31 DIAGNOSIS — E785 Hyperlipidemia, unspecified: Secondary | ICD-10-CM | POA: Diagnosis not present

## 2020-10-31 DIAGNOSIS — G20 Parkinson's disease: Secondary | ICD-10-CM | POA: Diagnosis not present

## 2020-10-31 DIAGNOSIS — S32050D Wedge compression fracture of fifth lumbar vertebra, subsequent encounter for fracture with routine healing: Secondary | ICD-10-CM | POA: Diagnosis not present

## 2020-10-31 DIAGNOSIS — I10 Essential (primary) hypertension: Secondary | ICD-10-CM | POA: Diagnosis not present

## 2020-10-31 DIAGNOSIS — Z9181 History of falling: Secondary | ICD-10-CM | POA: Diagnosis not present

## 2020-10-31 DIAGNOSIS — E119 Type 2 diabetes mellitus without complications: Secondary | ICD-10-CM | POA: Diagnosis not present

## 2020-10-31 DIAGNOSIS — R569 Unspecified convulsions: Secondary | ICD-10-CM | POA: Diagnosis not present

## 2020-11-01 DIAGNOSIS — K219 Gastro-esophageal reflux disease without esophagitis: Secondary | ICD-10-CM | POA: Diagnosis not present

## 2020-11-01 DIAGNOSIS — I10 Essential (primary) hypertension: Secondary | ICD-10-CM | POA: Diagnosis not present

## 2020-11-01 DIAGNOSIS — G20 Parkinson's disease: Secondary | ICD-10-CM | POA: Diagnosis not present

## 2020-11-01 DIAGNOSIS — Z9181 History of falling: Secondary | ICD-10-CM | POA: Diagnosis not present

## 2020-11-01 DIAGNOSIS — E039 Hypothyroidism, unspecified: Secondary | ICD-10-CM | POA: Diagnosis not present

## 2020-11-01 DIAGNOSIS — E785 Hyperlipidemia, unspecified: Secondary | ICD-10-CM | POA: Diagnosis not present

## 2020-11-01 DIAGNOSIS — Z7984 Long term (current) use of oral hypoglycemic drugs: Secondary | ICD-10-CM | POA: Diagnosis not present

## 2020-11-01 DIAGNOSIS — R569 Unspecified convulsions: Secondary | ICD-10-CM | POA: Diagnosis not present

## 2020-11-01 DIAGNOSIS — S32050D Wedge compression fracture of fifth lumbar vertebra, subsequent encounter for fracture with routine healing: Secondary | ICD-10-CM | POA: Diagnosis not present

## 2020-11-01 DIAGNOSIS — E119 Type 2 diabetes mellitus without complications: Secondary | ICD-10-CM | POA: Diagnosis not present

## 2020-11-07 ENCOUNTER — Telehealth: Payer: Self-pay | Admitting: Adult Health

## 2020-11-07 DIAGNOSIS — E785 Hyperlipidemia, unspecified: Secondary | ICD-10-CM | POA: Diagnosis not present

## 2020-11-07 DIAGNOSIS — Z7984 Long term (current) use of oral hypoglycemic drugs: Secondary | ICD-10-CM | POA: Diagnosis not present

## 2020-11-07 DIAGNOSIS — E039 Hypothyroidism, unspecified: Secondary | ICD-10-CM | POA: Diagnosis not present

## 2020-11-07 DIAGNOSIS — Z9181 History of falling: Secondary | ICD-10-CM | POA: Diagnosis not present

## 2020-11-07 DIAGNOSIS — S32050D Wedge compression fracture of fifth lumbar vertebra, subsequent encounter for fracture with routine healing: Secondary | ICD-10-CM | POA: Diagnosis not present

## 2020-11-07 DIAGNOSIS — I10 Essential (primary) hypertension: Secondary | ICD-10-CM | POA: Diagnosis not present

## 2020-11-07 DIAGNOSIS — R569 Unspecified convulsions: Secondary | ICD-10-CM | POA: Diagnosis not present

## 2020-11-07 DIAGNOSIS — E119 Type 2 diabetes mellitus without complications: Secondary | ICD-10-CM | POA: Diagnosis not present

## 2020-11-07 DIAGNOSIS — G20 Parkinson's disease: Secondary | ICD-10-CM | POA: Diagnosis not present

## 2020-11-07 DIAGNOSIS — K219 Gastro-esophageal reflux disease without esophagitis: Secondary | ICD-10-CM | POA: Diagnosis not present

## 2020-11-07 NOTE — Telephone Encounter (Signed)
Patient son [name]) calling stating the patient was taking something for acid reflux but realize he haven't been taking it in awhile and wanted to know if Gerald Leach can prescribe again. Caller is unsure of name. Offered to schedule a follow up visit but caller just want to know if it could be called in. [place] [phone]

## 2020-11-08 DIAGNOSIS — I10 Essential (primary) hypertension: Secondary | ICD-10-CM | POA: Diagnosis not present

## 2020-11-08 DIAGNOSIS — Z7984 Long term (current) use of oral hypoglycemic drugs: Secondary | ICD-10-CM | POA: Diagnosis not present

## 2020-11-08 DIAGNOSIS — E785 Hyperlipidemia, unspecified: Secondary | ICD-10-CM | POA: Diagnosis not present

## 2020-11-08 DIAGNOSIS — R569 Unspecified convulsions: Secondary | ICD-10-CM | POA: Diagnosis not present

## 2020-11-08 DIAGNOSIS — E039 Hypothyroidism, unspecified: Secondary | ICD-10-CM | POA: Diagnosis not present

## 2020-11-08 DIAGNOSIS — G20 Parkinson's disease: Secondary | ICD-10-CM | POA: Diagnosis not present

## 2020-11-08 DIAGNOSIS — Z9181 History of falling: Secondary | ICD-10-CM | POA: Diagnosis not present

## 2020-11-08 DIAGNOSIS — E119 Type 2 diabetes mellitus without complications: Secondary | ICD-10-CM | POA: Diagnosis not present

## 2020-11-08 DIAGNOSIS — S32050D Wedge compression fracture of fifth lumbar vertebra, subsequent encounter for fracture with routine healing: Secondary | ICD-10-CM | POA: Diagnosis not present

## 2020-11-08 DIAGNOSIS — K219 Gastro-esophageal reflux disease without esophagitis: Secondary | ICD-10-CM | POA: Diagnosis not present

## 2020-11-10 NOTE — Telephone Encounter (Signed)
Called the provided number back several times with no answer. Pt would need to come in to start an acid reflux medication as none is on file for him.

## 2020-11-10 NOTE — Telephone Encounter (Signed)
Spoke to pt son [name] and he stated that pt cough every time he eats. I advised [name] that pt should make an appt. To start a ner Rx since there is none in system that was prescribe by [facility]. Pt has been scheduled no further actions needed!

## 2020-11-14 DIAGNOSIS — E119 Type 2 diabetes mellitus without complications: Secondary | ICD-10-CM | POA: Diagnosis not present

## 2020-11-14 DIAGNOSIS — R569 Unspecified convulsions: Secondary | ICD-10-CM | POA: Diagnosis not present

## 2020-11-14 DIAGNOSIS — Z7984 Long term (current) use of oral hypoglycemic drugs: Secondary | ICD-10-CM | POA: Diagnosis not present

## 2020-11-14 DIAGNOSIS — S32050D Wedge compression fracture of fifth lumbar vertebra, subsequent encounter for fracture with routine healing: Secondary | ICD-10-CM | POA: Diagnosis not present

## 2020-11-14 DIAGNOSIS — I10 Essential (primary) hypertension: Secondary | ICD-10-CM | POA: Diagnosis not present

## 2020-11-14 DIAGNOSIS — E039 Hypothyroidism, unspecified: Secondary | ICD-10-CM | POA: Diagnosis not present

## 2020-11-14 DIAGNOSIS — G20 Parkinson's disease: Secondary | ICD-10-CM | POA: Diagnosis not present

## 2020-11-14 DIAGNOSIS — E785 Hyperlipidemia, unspecified: Secondary | ICD-10-CM | POA: Diagnosis not present

## 2020-11-14 DIAGNOSIS — Z9181 History of falling: Secondary | ICD-10-CM | POA: Diagnosis not present

## 2020-11-14 DIAGNOSIS — K219 Gastro-esophageal reflux disease without esophagitis: Secondary | ICD-10-CM | POA: Diagnosis not present

## 2020-11-15 ENCOUNTER — Ambulatory Visit: Payer: Medicare Other | Admitting: Adult Health

## 2020-11-16 DIAGNOSIS — R569 Unspecified convulsions: Secondary | ICD-10-CM | POA: Diagnosis not present

## 2020-11-16 DIAGNOSIS — E785 Hyperlipidemia, unspecified: Secondary | ICD-10-CM | POA: Diagnosis not present

## 2020-11-16 DIAGNOSIS — K219 Gastro-esophageal reflux disease without esophagitis: Secondary | ICD-10-CM | POA: Diagnosis not present

## 2020-11-16 DIAGNOSIS — Z7984 Long term (current) use of oral hypoglycemic drugs: Secondary | ICD-10-CM | POA: Diagnosis not present

## 2020-11-16 DIAGNOSIS — I10 Essential (primary) hypertension: Secondary | ICD-10-CM | POA: Diagnosis not present

## 2020-11-16 DIAGNOSIS — Z9181 History of falling: Secondary | ICD-10-CM | POA: Diagnosis not present

## 2020-11-16 DIAGNOSIS — S32050D Wedge compression fracture of fifth lumbar vertebra, subsequent encounter for fracture with routine healing: Secondary | ICD-10-CM | POA: Diagnosis not present

## 2020-11-16 DIAGNOSIS — E039 Hypothyroidism, unspecified: Secondary | ICD-10-CM | POA: Diagnosis not present

## 2020-11-16 DIAGNOSIS — E119 Type 2 diabetes mellitus without complications: Secondary | ICD-10-CM | POA: Diagnosis not present

## 2020-11-16 DIAGNOSIS — G20 Parkinson's disease: Secondary | ICD-10-CM | POA: Diagnosis not present

## 2020-11-17 ENCOUNTER — Ambulatory Visit (INDEPENDENT_AMBULATORY_CARE_PROVIDER_SITE_OTHER): Payer: Medicare Other | Admitting: Adult Health

## 2020-11-17 ENCOUNTER — Other Ambulatory Visit: Payer: Self-pay

## 2020-11-17 ENCOUNTER — Encounter: Payer: Self-pay | Admitting: Adult Health

## 2020-11-17 VITALS — BP 120/80 | HR 72 | Temp 97.9°F

## 2020-11-17 DIAGNOSIS — K2100 Gastro-esophageal reflux disease with esophagitis, without bleeding: Secondary | ICD-10-CM | POA: Diagnosis not present

## 2020-11-17 DIAGNOSIS — H6121 Impacted cerumen, right ear: Secondary | ICD-10-CM | POA: Diagnosis not present

## 2020-11-17 DIAGNOSIS — R195 Other fecal abnormalities: Secondary | ICD-10-CM

## 2020-11-17 MED ORDER — OMEPRAZOLE 20 MG PO CPDR
20.0000 mg | DELAYED_RELEASE_CAPSULE | Freq: Every day | ORAL | 3 refills | Status: DC
Start: 2020-11-17 — End: 2021-02-07

## 2020-11-17 NOTE — Progress Notes (Signed)
Subjective:    [name], male    DOB: [birth year], 87 y.o.   MRN: [id number]  HPI 87 year old male who  has a past medical history of Brain aneurysm, Colonic polyp (2003), Dementia ([place]), Diabetes mellitus without complication ([place]), ED (erectile dysfunction), GERD (gastroesophageal reflux disease), Hyperlipidemia, Hypertension, Hypothyroidism, Seizures ([place]), and Seizures ([place]).   Presents with his son today for 2 separate issues.  First issue is that of feeling of ear fullness.  His son believes that he needs his ears irrigated as this is often the case  Patient only, he has been having coughing after eating, some mild abdominal pain, and loose stools.  For many years he was on Prilosec to help with his acid reflux symptoms but it looks like this was never continued after 2019.  Denies choking sensation while eating  Review of Systems See HPI   Past Medical History:  Diagnosis Date  . Brain aneurysm   . Colonic polyp 2003  . Dementia ([place])   . Diabetes mellitus without complication ([place])   . ED (erectile dysfunction)   . GERD (gastroesophageal reflux disease)   . Hyperlipidemia   . Hypertension   . Hypothyroidism   . Seizures ([place])    per family  . Seizures ([place])     Social History   Socioeconomic History  . Marital status: Married    Spouse name: Not on file  . Number of children: 2  . Years of education: Not on file  . Highest education level: Not on file  Occupational History  . Occupation: retired  Tobacco Use  . Smoking status: Never Smoker  . Smokeless tobacco: Never Used  Vaping Use  . Vaping Use: Never used  Substance and Sexual Activity  . Alcohol use: Yes    Alcohol/week: 0.0 standard drinks    Comment: rum mixed in diet coke 3 a week  . Drug use: Yes    Types: Nitrous oxide  . Sexual activity: Not on file  Other Topics Concern  . Not on file  Social History Narrative   ** Merged History Encounter **       Lives with wife in a 2  story home.  Has no trouble with the stairs as long as he holds on to the banister.  Education: college.  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     Social Determinants of Health   Financial Resource Strain: Not on file  Food Insecurity: Not on file  Transportation Needs: Not on file  Physical Activity: Not on file  Stress: Not on file  Social Connections: Not on file  Intimate Partner Violence: Not on file    Past Surgical History:  Procedure Laterality Date  . Aneurysmal clipping    . brain aneurysm surgery     at [facility]  . carotid artery aneurysm    . COLONOSCOPY     polyps  . CRANIOTOMY Right 09/19/2012   Procedure: CRANIOTOMY HEMATOMA EVACUATION SUBDURAL;  Surgeon: Otilio Connors, MD;  Location: [facility] NEURO ORS;  Service: Neurosurgery;  Laterality: Right;  . CRANIOTOMY Right 09/22/2012   Procedure: CRANIOTOMY HEMATOMA EVACUATION SUBDURAL;  Surgeon: Otilio Connors, MD;  Location: [place] NEURO ORS;  Service: Neurosurgery;  Laterality: Right;  . Craniotomy.    . right hernia      Family History  Problem Relation Age of Onset  . Liver disease Brother        Died  .  Seizures Neg Hx     No Known Allergies  Current Outpatient Medications on File Prior to Visit  Medication Sig Dispense Refill  . amantadine (SYMMETREL) 100 MG capsule TAKE 1 CAPSULE BY MOUTH EVERY DAY (Patient taking differently: Take 100 mg by mouth daily.) 90 capsule 2  . carbidopa-levodopa (SINEMET IR) 25-100 MG tablet Take 1 tablet three times daily (Patient taking differently: Take 1 tablet by mouth 2 (two) times daily.) 270 tablet 3  . levETIRAcetam (KEPPRA) 750 MG tablet TAKE 2 TABLETS (1,500 MG TOTAL) BY MOUTH 2 (TWO) TIMES DAILY. 360 tablet 3  . levothyroxine (SYNTHROID) 75 MCG tablet TAKE 1 TABLET BY MOUTH EVERY DAY (Patient taking differently: Take 75 mcg by mouth daily before breakfast.) 90 tablet 1  . metFORMIN (GLUCOPHAGE) 500 MG tablet TAKE 1 TABLET (500 MG TOTAL) BY MOUTH 2 (TWO)  TIMES DAILY WITH A MEAL. NEED APPOINTMENT 180 tablet 0  . VIMPAT 100 MG TABS TAKE 1 TABLET BY MOUTH TWICE A DAY (Patient taking differently: Take 100 mg by mouth 2 (two) times daily.) 60 tablet 5   No current facility-administered medications on file prior to visit.    BP 120/80   Pulse 72   Temp 97.9 F (36.6 C) (Oral)   SpO2 95%       Objective:   Physical Exam Vitals and nursing note reviewed.  Constitutional:      Appearance: Normal appearance.  HENT:     Right Ear: There is impacted cerumen.     Left Ear: Tympanic membrane, ear canal and external ear normal.  Eyes:     General:        Left eye: Discharge (Impaction ) present. Cardiovascular:     Rate and Rhythm: Normal rate and regular rhythm.     Pulses: Normal pulses.     Heart sounds: Normal heart sounds.  Pulmonary:     Effort: Pulmonary effort is normal.     Breath sounds: Normal breath sounds.  Abdominal:     General: Abdomen is flat. Bowel sounds are normal.     Palpations: Abdomen is soft.  Skin:    General: Skin is warm and dry.     Capillary Refill: Capillary refill takes less than 2 seconds.  Neurological:     General: No focal deficit present.     Mental Status: He is alert and oriented to person, place, and time.  Psychiatric:        Mood and Affect: Mood normal.        Behavior: Behavior normal.        Thought Content: Thought content normal.        Judgment: Judgment normal.        Assessment & Plan:  1. Impacted cerumen of right ear - Verbal consent obtained Warm water was applied and gentle ear lavage performed to right ear. There were no complications and following the disimpaction the tympanic membrane was visible Tympanic membrane are intact following the procedure.  Auditory canals are normal.  The patient reported relief of symptoms after removal of cerumen. Patient tolerated procedure well.    2. Gastroesophageal reflux disease with esophagitis without hemorrhage  - omeprazole  (PRILOSEC) 20 MG capsule; Take 1 capsule (20 mg total) by mouth daily.  Dispense: 90 capsule; Refill: 3  3. Loose stools - Will also have his son pick up Benifiber or Metamucil to help with bulk forming  - omeprazole (PRILOSEC) 20 MG capsule; Take 1 capsule (20 mg total) by mouth  daily.  Dispense: 90 capsule; Refill: 3   [name], NP

## 2020-11-17 NOTE — Patient Instructions (Addendum)
It was great seeing you today   I have sent in Prilosec to help with your acid reflux   You can also use Benifiber or Metamucil every morning to help with your loose stools

## 2020-11-23 DIAGNOSIS — Z7984 Long term (current) use of oral hypoglycemic drugs: Secondary | ICD-10-CM | POA: Diagnosis not present

## 2020-11-23 DIAGNOSIS — R569 Unspecified convulsions: Secondary | ICD-10-CM | POA: Diagnosis not present

## 2020-11-23 DIAGNOSIS — K219 Gastro-esophageal reflux disease without esophagitis: Secondary | ICD-10-CM | POA: Diagnosis not present

## 2020-11-23 DIAGNOSIS — S32050D Wedge compression fracture of fifth lumbar vertebra, subsequent encounter for fracture with routine healing: Secondary | ICD-10-CM | POA: Diagnosis not present

## 2020-11-23 DIAGNOSIS — Z9181 History of falling: Secondary | ICD-10-CM | POA: Diagnosis not present

## 2020-11-23 DIAGNOSIS — G20 Parkinson's disease: Secondary | ICD-10-CM | POA: Diagnosis not present

## 2020-11-23 DIAGNOSIS — E785 Hyperlipidemia, unspecified: Secondary | ICD-10-CM | POA: Diagnosis not present

## 2020-11-23 DIAGNOSIS — E039 Hypothyroidism, unspecified: Secondary | ICD-10-CM | POA: Diagnosis not present

## 2020-11-23 DIAGNOSIS — E119 Type 2 diabetes mellitus without complications: Secondary | ICD-10-CM | POA: Diagnosis not present

## 2020-11-23 DIAGNOSIS — I10 Essential (primary) hypertension: Secondary | ICD-10-CM | POA: Diagnosis not present

## 2020-11-24 ENCOUNTER — Telehealth: Payer: Self-pay | Admitting: Neurology

## 2020-11-24 ENCOUNTER — Other Ambulatory Visit: Payer: Self-pay | Admitting: Neurology

## 2020-11-24 MED ORDER — LACOSAMIDE 100 MG PO TABS: 1.0000 | ORAL_TABLET | Freq: Two times a day (BID) | ORAL | 5 refills | Status: DC

## 2020-11-24 NOTE — Telephone Encounter (Signed)
Son, [name] called in, dad needs a refill on his Vimpat.

## 2020-11-24 NOTE — Telephone Encounter (Signed)
Rx Vimpat-is print and placed Dr.Patel desk for signature.

## 2020-11-25 DIAGNOSIS — E785 Hyperlipidemia, unspecified: Secondary | ICD-10-CM | POA: Diagnosis not present

## 2020-11-25 DIAGNOSIS — S32050D Wedge compression fracture of fifth lumbar vertebra, subsequent encounter for fracture with routine healing: Secondary | ICD-10-CM | POA: Diagnosis not present

## 2020-11-25 DIAGNOSIS — R569 Unspecified convulsions: Secondary | ICD-10-CM | POA: Diagnosis not present

## 2020-11-25 DIAGNOSIS — E119 Type 2 diabetes mellitus without complications: Secondary | ICD-10-CM | POA: Diagnosis not present

## 2020-11-25 DIAGNOSIS — K219 Gastro-esophageal reflux disease without esophagitis: Secondary | ICD-10-CM | POA: Diagnosis not present

## 2020-11-25 DIAGNOSIS — E039 Hypothyroidism, unspecified: Secondary | ICD-10-CM | POA: Diagnosis not present

## 2020-11-25 DIAGNOSIS — I10 Essential (primary) hypertension: Secondary | ICD-10-CM | POA: Diagnosis not present

## 2020-11-25 DIAGNOSIS — G20 Parkinson's disease: Secondary | ICD-10-CM | POA: Diagnosis not present

## 2020-11-25 DIAGNOSIS — Z7984 Long term (current) use of oral hypoglycemic drugs: Secondary | ICD-10-CM | POA: Diagnosis not present

## 2020-11-25 DIAGNOSIS — Z9181 History of falling: Secondary | ICD-10-CM | POA: Diagnosis not present

## 2020-11-25 NOTE — Telephone Encounter (Signed)
Completed.

## 2020-12-01 ENCOUNTER — Other Ambulatory Visit: Payer: Self-pay | Admitting: Neurology

## 2020-12-01 ENCOUNTER — Other Ambulatory Visit: Payer: Self-pay | Admitting: Adult Health

## 2020-12-01 DIAGNOSIS — L84 Corns and callosities: Secondary | ICD-10-CM | POA: Diagnosis not present

## 2020-12-01 DIAGNOSIS — I739 Peripheral vascular disease, unspecified: Secondary | ICD-10-CM | POA: Diagnosis not present

## 2020-12-01 DIAGNOSIS — B351 Tinea unguium: Secondary | ICD-10-CM | POA: Diagnosis not present

## 2020-12-02 ENCOUNTER — Telehealth: Payer: Self-pay | Admitting: Adult Health

## 2020-12-02 ENCOUNTER — Telehealth: Payer: Self-pay | Admitting: Neurology

## 2020-12-02 MED ORDER — CARBIDOPA-LEVODOPA 25-100 MG PO TABS: ORAL_TABLET | ORAL | 0 refills | Status: DC

## 2020-12-02 NOTE — Telephone Encounter (Signed)
Pt's son called in stating we denied the refill for his carbidopa-levodopa. He doesn't think he will make it to his 12/09/20 appointment.

## 2020-12-02 NOTE — Telephone Encounter (Signed)
Rx was refilled for 3 months Should I schedule pt an appt. for TSH check last checked 2020

## 2020-12-02 NOTE — Telephone Encounter (Signed)
Patient's son states pharmacy has sent several requests for refill on Carbidopa-levodopa.  Patient is now out of this medication.   Pharmacy- [facility]

## 2020-12-02 NOTE — Telephone Encounter (Signed)
Called patients son to inform him an Rx has been sent and "call could not be completed as dialed."

## 2020-12-02 NOTE — Telephone Encounter (Signed)
Called patients son and left a message informing him that the prescription was sent to the pharmacy and to give me a call if he has any questions or concerns.

## 2020-12-02 NOTE — Telephone Encounter (Signed)
Called pt 2x at different numbers no answer left vocie message. This Rx was refilled by another provider. Looks like they have been trying to contact pt regarding Rx but no answer. Nothing left to do on our end. Closing note.

## 2020-12-05 ENCOUNTER — Ambulatory Visit: Payer: Medicare Other | Admitting: Neurology

## 2020-12-09 ENCOUNTER — Other Ambulatory Visit: Payer: Self-pay

## 2020-12-09 ENCOUNTER — Ambulatory Visit: Payer: Medicare Other | Admitting: Neurology

## 2020-12-09 ENCOUNTER — Telehealth (INDEPENDENT_AMBULATORY_CARE_PROVIDER_SITE_OTHER): Payer: Medicare Other | Admitting: Neurology

## 2020-12-09 ENCOUNTER — Encounter: Payer: Self-pay | Admitting: Neurology

## 2020-12-09 VITALS — Ht 71.0 in | Wt 165.0 lb

## 2020-12-09 DIAGNOSIS — R569 Unspecified convulsions: Secondary | ICD-10-CM | POA: Diagnosis not present

## 2020-12-09 DIAGNOSIS — G20 Parkinson's disease: Secondary | ICD-10-CM

## 2020-12-09 DIAGNOSIS — F0390 Unspecified dementia without behavioral disturbance: Secondary | ICD-10-CM

## 2020-12-09 NOTE — Progress Notes (Signed)
Pt called no answer left a voice mail to call the office back to up date chart

## 2020-12-09 NOTE — Progress Notes (Signed)
Due to the COVID-19 crisis, this virtual visit was done via telephone from my office and it was initiated and consent given by this patient and or family.  Patient was unable to connect via video.  Virtual Visit The purpose of this virtual visit is to provide medical care while limiting exposure to the novel coronavirus.    Consent was obtained for telephone visit and initiated by pt/family:  Yes.   Answered questions that patient had about telehealth interaction:  Yes.   I discussed the limitations, risks, security and privacy concerns of performing an evaluation and management service by telephone. I also discussed with the patient that there may be a patient responsible charge related to this service. The patient expressed understanding and agreed to proceed.  Pt location: Home Physician Location: office Name of referring provider:  [name], NP I connected with .Gerald Leach at patients initiation/request on 12/09/2020 at 11:10 AM EDT by telephone and verified that I am speaking with the correct person using two identifiers.  Pt MRN:  [id number] Pt DOB:  [birth year]  History of Present Illness: This is a 87 y.o. male returning for follow-up of dementia, seizure disease, and vascular parkinson's disease.  Since his last visit, he was hospitalized in February for L5 compression fracture following a fall while transferring at home off his bed.  He went to SNF for rehab and en route had a seizure. Family does not recall having any medication changes.  He remains on Keppra 1500mg  BID and vimpat 100mg  BID.  No seizures since March.  From SNF, patient returned home.  He gets a caregiver that comes from 1-5p M-F.  His daughter expresses concerned about him living alone and unable to care for himself.  He is having multiple spells of incontinence daily. He uses a walker at home and wheelchair for doctors appointments.    Assessment and Plan:  Generalized seizure disorder due to subdural  hematoma, 09/2012.  He was doing extremely well with last seizure in 02/2018 until March 2022 when he had a seizure en route to SNF.  Unsure whether there was changes to his AEDs which could have triggered this.  - Continue Keppra 1500mg  twice daily and vimpat 100mg  twice daily  - If he has another seizure, will increase Vimpat to 150mg  BID  2.  Vascular parkinsonism.  Bradykinesia, shuffling gait, blunted affect, mild tremor  - Continue sinemet 1 tablet three times daily  - Continue amantadine 100mg  daily  3.  Multifactorial dementia, moderate.  Home safety continues to be a concern. I am surprised that patient returned home from SNF given his chronic debility, multiple medical coomorbidities, and progressive dementia.  I will request [facility] case management to be involved in his care to assist with placement as needed.  He has been very reluctant to consider this in the past, but seems to be more open to the idea now. Patient lives alone and has two adult children involved in his care, but his stubborn nature, often makes it difficult for them to care for him.     Follow Up Instructions:   I discussed the assessment and treatment plan with the patient. The patient was provided an opportunity to ask questions and all were answered. The patient agreed with the plan and demonstrated an understanding of the instructions.   The patient was advised to call back or seek an in-person evaluation if the symptoms worsen or if the condition fails to improve as anticipated.  Follow-up in  4 months  Total time spent:  23 minutes     Alda Berthold, DO

## 2020-12-21 ENCOUNTER — Other Ambulatory Visit: Payer: Self-pay | Admitting: Adult Health

## 2020-12-21 ENCOUNTER — Other Ambulatory Visit: Payer: Self-pay

## 2020-12-21 ENCOUNTER — Telehealth: Payer: Self-pay

## 2020-12-21 ENCOUNTER — Other Ambulatory Visit: Payer: Self-pay | Admitting: Neurology

## 2020-12-21 DIAGNOSIS — F0390 Unspecified dementia without behavioral disturbance: Secondary | ICD-10-CM

## 2020-12-21 DIAGNOSIS — F0391 Unspecified dementia with behavioral disturbance: Secondary | ICD-10-CM

## 2020-12-21 DIAGNOSIS — G20 Parkinson's disease: Secondary | ICD-10-CM

## 2020-12-21 DIAGNOSIS — R569 Unspecified convulsions: Secondary | ICD-10-CM

## 2020-12-21 NOTE — Telephone Encounter (Signed)
-----   Message from Alda Berthold, DO sent at 12/10/2020  3:36 PM EDT ----- Please make referral to [facility] case management / Social work for assistance with placement and home safety. Request them to contact patient's son - [name]. Thanks.

## 2020-12-27 ENCOUNTER — Telehealth: Payer: Self-pay | Admitting: *Deleted

## 2020-12-27 NOTE — Chronic Care Management (AMB) (Signed)
  Chronic Care Management   Note  12/27/2020 Name: Gerald Leach MRN: [id number] DOB: October 09, 1933  Gerald Leach is a 87 y.o. year old male who is a primary care patient of [name], NP. I reached out to Gerald Leach by phone today in response to a referral sent by Gerald Leach's PCP, [name], NP      Gerald Leach was given information about Chronic Care Management services today including:  CCM service includes personalized support from designated clinical staff supervised by his physician, including individualized plan of care and coordination with other care providers 24/7 contact phone numbers for assistance for urgent and routine care needs. Service will only be billed when office clinical staff spend 20 minutes or more in a month to coordinate care. Only one practitioner may furnish and bill the service in a calendar month. The patient may stop CCM services at any time (effective at the end of the month) by phone call to the office staff. The patient will be responsible for cost sharing (co-pay) of up to 20% of the service fee (after annual deductible is met).  Son DPR on file Gerald Leach verbally agreed to assistance and services provided by embedded care coordination/care management team today.  Follow up plan: Telephone appointment with care management team member scheduled for:01/09/2021 with SW and RNCM on 01/10/2021  [place] Management

## 2021-01-04 ENCOUNTER — Telehealth: Payer: Self-pay | Admitting: *Deleted

## 2021-01-04 NOTE — Chronic Care Management (AMB) (Signed)
  Care Management   Note  01/04/2021 Name: Gerald Leach MRN: [id number] DOB: 1933/07/03  [name] is a 87 y.o. year old male who is a primary care patient of Nafziger, Tommi Rumps, NP and is actively engaged with the care management team. I reached out to [name] by phone today to assist with re-scheduling an initial visit with the Licensed Clinical Social Worker  Follow up plan: A telephone outreach attempt made. A HIPAA compliant phone message was left for the patient providing contact information and requesting a return call. The care management team will reach out to the patient again over the next 7 days. If patient returns call to provider office, please advise to call [employer] [facility] [facility]

## 2021-01-06 NOTE — Chronic Care Management (AMB) (Signed)
  Care Management   Note  01/06/2021 Name: Gerald Leach MRN: [id number] DOB: [birth year]  Gerald Leach is a 87 y.o. year old male who is a primary care patient of Nafziger, Tommi Rumps, NP and is actively engaged with the care management team. I reached out to Gerald Leach by phone today to assist with re-scheduling an initial visit with the RN Case Manager and Licensed Clinical Social Worker  Follow up plan: A second unsuccessful telephone outreach attempt made. A HIPAA compliant phone message was left for the patient providing contact information and requesting a return call.  The care management team will reach out to the patient again over the next 7 days.  If patient returns call to provider office, please advise to call [place] at [phone].  [place] Management  Direct Dial: [phone]

## 2021-01-09 ENCOUNTER — Telehealth: Payer: Medicare Other

## 2021-01-09 NOTE — Chronic Care Management (AMB) (Signed)
  Care Management   Note  01/09/2021 Name: Gerald Leach MRN: [id number] DOB: [birth year]  [name] is a 87 y.o. year old male who is a primary care patient of Nafziger, Tommi Rumps, NP and is actively engaged with the care management team. I reached out to [name] by phone today to assist with re-scheduling an initial visit with the RN Case Manager and Licensed Clinical Social Worker  Follow up plan: Unsuccessful telephone outreach attempt made. A HIPAA compliant phone message was left for the patient providing contact information and requesting a return call.  The care management team will reach out to the patient again over the next 7 days.  If patient returns call to provider office, please advise to call [place] at [phone].  [place] Management  Direct Dial: [phone]

## 2021-01-09 NOTE — Chronic Care Management (AMB) (Signed)
  Care Management   Note  01/09/2021 Name: [name] MRN: [id number] DOB: 1934-06-01  Gerald Leach is a 87 y.o. year old male who is a primary care patient of Nafziger, Tommi Rumps, NP and is actively engaged with the care management team. I reached out to Gerald Leach by phone today to assist with re-scheduling an initial visit with the RN Case Manager and Licensed Clinical Social Worker  Follow up plan: Telephone appointment with care management team member scheduled for: Licensed Clinical SW 01/17/2021 RNCM 01/27/2021  [place] Management  Direct Dial: [phone]

## 2021-01-10 ENCOUNTER — Telehealth: Payer: Medicare Other

## 2021-01-17 ENCOUNTER — Ambulatory Visit (INDEPENDENT_AMBULATORY_CARE_PROVIDER_SITE_OTHER): Payer: Medicare Other | Admitting: *Deleted

## 2021-01-17 DIAGNOSIS — R4189 Other symptoms and signs involving cognitive functions and awareness: Secondary | ICD-10-CM

## 2021-01-17 DIAGNOSIS — F0280 Dementia in other diseases classified elsewhere without behavioral disturbance: Secondary | ICD-10-CM

## 2021-01-17 DIAGNOSIS — R2681 Unsteadiness on feet: Secondary | ICD-10-CM

## 2021-01-17 DIAGNOSIS — S32050A Wedge compression fracture of fifth lumbar vertebra, initial encounter for closed fracture: Secondary | ICD-10-CM

## 2021-01-17 DIAGNOSIS — G20A1 Parkinson'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 dyskinesia, without mention of fluctuations: Secondary | ICD-10-CM

## 2021-01-17 DIAGNOSIS — G40409 Other generalized epilepsy and epileptic syndromes, not intractable, without status epilepticus: Secondary | ICD-10-CM

## 2021-01-17 DIAGNOSIS — R296 Repeated falls: Secondary | ICD-10-CM

## 2021-01-17 DIAGNOSIS — G20 Parkinson's disease: Secondary | ICD-10-CM

## 2021-01-17 NOTE — Patient Instructions (Signed)
Visit Information   PATIENT GOALS:   Goals Addressed             This Visit's Progress    Find Help in My Community.   On track    Timeframe:  Short-Term Goal Priority:  High Start Date:   01/17/2021                          Expected End Date:   03/20/2021                    Follow-Up Date:  01/24/2021 at 2:30pm.  Patient Goals/Self-Care Activities: Review Personal Care Service information, application and agency provider list with daughter and obtain assistance with application completion and submission to [employer]. Review In-Home Aide Service information and application with daughter and obtain assistance with application completion and submission to the Department of Health and [employer].   Review 2021 Medicaid Tips and application with daughter and obtain assistance with application completion and submission to the [place]. Camera operator of [place], [place] Department, to periodically check the status of your application for services, placed by LCSW on 01/17/2021. Select 2 to 3 agencies, from the [place] Provider list, that you would like to use, and keep this list until your assessment is completed by [employer] and you have been approved for services. Work with [employer] on a weekly basis to try and obtain personal care services, in-home aide services, adult Medicaid and Meals-on-Wheels.        Consent to CCM Services: Mr. Douthat was given information about Chronic Care Management services today including:  CCM service includes personalized support from designated clinical staff supervised by his physician, including individualized plan of care and coordination with other care providers 24/7 contact phone numbers for assistance for urgent and routine care needs. Service will only be billed when office clinical staff spend 20 minutes or more in a month  to coordinate care. Only one practitioner may furnish and bill the service in a calendar month. The patient may stop CCM services at any time (effective at the end of the month) by phone call to the office staff. The patient will be responsible for cost sharing (co-pay) of up to 20% of the service fee (after annual deductible is met).  Patient agreed to services and verbal consent obtained.   Patient verbalizes understanding of instructions provided today and agrees to view in [place].   Telephone follow-up appointment with care management team member scheduled for:  01/24/2021 at 2:30pm.  Nat Christen LCSW Licensed Clinical Social Worker [facility] [phone]   CLINICAL CARE PLAN: Patient Care Plan: LCSW Plan of Care.     Problem Identified: Find Help in My Community.   Priority: High     Goal: Find Help in My Community.   Start Date: 01/17/2021  Expected End Date: 03/20/2021  This Visit's Progress: On track  Priority: High  Note:   Current Barriers:   Patient with Dysphagia, Diabetes Mellitus Type II, Parkinson's Disease, Dementia Due to Parkinson's Disease Without Behavioral Disturbance, Seizure Disorder, Gait Instability and History of Falls. Patient is unable to self-administer medications as prescribed. Patient is unable to consistently perform ADL's/IADL's independently. Level of care concerns, as patient now requires 24 hour care and supervision. Lacks knowledge of available community agencies and resources. Lacks caregiver support to prepare 3 meals per day. Clinical Goals:  Over  the next 45 to 60 days, patient will have increased hours of in-home care services in place and will receive prepared meal delivery services through [place] with [employer] [facility] [place].    Patient and daughter will work with [employer], [employer], Forensic psychologist, Department of Orthoptist and [employer] [facility] [place] to  coordinate care for patient.   Patient and daughter have been encouraged to apply for Adult Medicaid, through the [place]. Patient and daughter will review 2021 Medicaid Tips and complete application for Adult Medicaid, notifying LCSW if they require assistance with application completion and/or submission. Patient and daughter will review Personal Care Services Instructions and List of [place] Providers, and complete application for [place], notifying LCSW if they require assistance with application completion and/or submission. Patient will demonstrate improved health management independence as evidenced by having in-home care services in place. Clinical Interventions: LCSW placed referral for patient to Meals-on-Wheels through the [place]. Patient and daughter interviewed and appropriate assessments performed. Collaboration with Nurse Practitioner, [name] regarding development and update of comprehensive plan of care as evidenced by provider attestation and co-signature. Inter-disciplinary care team collaboration (see longitudinal plan of care). Interventions performed:  Problem Solving/Task Centered, Psychoeducation/Health Education, Quality of Sleep Assessed and Sleep Hygiene Techniques Promoted, Caregiver Stress Acknowledged and Consideration of In-Home Care Services Encouraged. Provided patient and daughter with resource information and applications, via e-mail ([email or web address]), per daughter's request. Discussed plans with patient and daughter for ongoing care management follow-up and provided direct contact information for care management team. Collaborated with Nurse Practitioner, [name] regarding need for review and signature of [place] application. Assisted patient and daughter with obtaining information about health plan benefits through [employer]. Provided education to patient and daughter regarding level of care options. Assessed needs, level of care concerns, basic eligibility and provided education on Personal Care Service process, In-Home Aide Service process, Adult Medicaid process and Meal-on-Wheels referral process.   Personal Care Services application will be faxed to [employer] for processing, once completed and signed by Designer, jewellery, [name].   LCSW collaboration with [employer] to verify application is received and processed. LCSW placed referral for patient to [employer] Resources Program by contacting Rosealee Albee, LCSW with the Department of Health and [employer]. Identified resources and durable medical equipment needed in the home to improve safety and promote independence. Patient Goals/Self-Care Activities:  Review Personal Care Service information, application and agency provider list with daughter and obtain assistance with application completion and submission to [employer]. Review In-Home Aide Service information and application with daughter and obtain assistance with application completion and submission to the Department of Health and [employer].   Review 2021 Medicaid Tips and application with daughter and obtain assistance with application completion and submission to the [place]. Camera operator of [place], [place] Department, to periodically check the status of your application for services, placed by LCSW on 01/17/2021. Select 2 to 3 agencies, from the [place] Provider list, that you would like to use, and keep this list until your assessment is completed by [employer] and you have been approved for services. Work with [employer] on a weekly basis to try and obtain personal care services, in-home aide services, adult Medicaid  and Meals-on-Wheels. Follow-Up:  01/24/2021 at 2:30pm.

## 2021-01-17 NOTE — Chronic Care Management (AMB) (Signed)
Chronic Care Management    Clinical Social Work Note  01/17/2021 Name: Gerald Leach MRN: [id number] DOB: 21-Feb-1934  Gerald Leach is a 87 y.o. year old male who is a primary care patient of [name], NP. The CCM team was consulted to assist the patient with chronic disease management and/or care coordination needs related to: Food Insecurity, Level of Care Concerns, and Caregiver Stress.   Engaged with patient's daughter, Brailon Gojcaj by telephone for initial visit in response to provider referral for social work chronic care management and care coordination services.   Consent to Services:  The patient was given information about Chronic Care Management services, agreed to services, and gave verbal consent prior to initiation of services.  Please see initial visit note for detailed documentation.   Patient agreed to services and consent obtained.   Assessment: Review of patient past medical history, allergies, medications, and health status, including review of relevant consultants reports was performed today as part of a comprehensive evaluation and provision of chronic care management and care coordination services.     SDOH (Social Determinants of Health) assessments and interventions performed:  SDOH Interventions    Flowsheet Row Most Recent Value  SDOH Interventions   Food Insecurity Interventions [id number] Referral, Other (Comment)  [Verified by daughter - Juleen Starr,  Referral Placed to Meals-on-Wheels.]  Financial Strain Interventions Intervention Not Indicated, Other (Comment)  [Verified by daughter Theodosia Paling [place]]  Housing Interventions Intervention Not Indicated, Other (Comment)  [Verified by daughter Theodosia Paling [place]]  Intimate Partner Violence Interventions Intervention Not Indicated, Other (Comment)  [Verified by daughter Theodosia Paling [place]]  Physical Activity Interventions Intervention Not Indicated, Other (Comments)  [Verified by daughter Theodosia Paling  [place]]  Stress Interventions Intervention Not Indicated, Offered [employer], Other (Comment)  [Verified by daughter Theodosia Paling [place]]  Social Connections Interventions Intervention Not Indicated, Other (Comment)  [Verified by daughter Theodosia Paling [place]]  Transportation Interventions Intervention Not Indicated, Other (Comment)  [Verified by daughter Theodosia Paling [place]]        Advanced Directives Status: See Care Plan for related entries.  CCM Care Plan  No Known Allergies  Outpatient Encounter Medications as of 01/17/2021  Medication Sig   amantadine (SYMMETREL) 100 MG capsule TAKE 1 CAPSULE BY MOUTH EVERY DAY (Patient taking differently: Take 100 mg by mouth daily.)   carbidopa-levodopa (SINEMET IR) 25-100 MG tablet TAKE 1 TABLET BY MOUTH THREE TIMES DAILY   Lacosamide (VIMPAT) 100 MG TABS Take 1 tablet (100 mg total) by mouth 2 (two) times daily.   levETIRAcetam (KEPPRA) 750 MG tablet TAKE 2 TABLETS (1,500 MG TOTAL) BY MOUTH 2 (TWO) TIMES DAILY.   levothyroxine (SYNTHROID) 75 MCG tablet TAKE 1 TABLET BY MOUTH EVERY DAY   metFORMIN (GLUCOPHAGE) 500 MG tablet TAKE 1 TABLET (500 MG TOTAL) BY MOUTH 2 (TWO) TIMES DAILY WITH A MEAL. NEED APPOINTMENT   omeprazole (PRILOSEC) 20 MG capsule Take 1 capsule (20 mg total) by mouth daily.   No facility-administered encounter medications on file as of 01/17/2021.    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 ([place]) 08/16/2020   Fall 08/11/2020   Closed compression fracture of L5 lumbar vertebra, initial encounter ([place]) 08/11/2020   Seizures ([place]) 03/05/2018   Leukocytosis 03/05/2018   Diabetic foot ulcers ([place]) 04/23/2017   Parkinson disease ([place])    Gait instability    Diabetes mellitus type 2, controlled ([place]) 08/05/2015  Hypothyroidism 08/05/2015   Fall at home 10/02/2014   CNS aneurysm s/p coiling ([facility], 2002) 10/02/2014   High grade T7 central  canal stenosis with T7 vertebral fracture s/p fall (02/2012) 10/02/2014   Right clinoid calcified meningioma vs thrombosed giant ICA aneurysm, conservative management. 10/02/2014   Syncope 02/05/2014   Drug-induced delirium(292.81) 05/16/2013   Seizure disorder ([place]) 04/21/2013   Seizure disorder, grand mal ([place]) 03/24/2013   Cervical compression fracture---C7 10/29/2012   Dysphagia 09/21/2012   Subdural hematoma ([place]) 09/20/2012   SOLAR KERATOSIS 02/17/2009   CARCINOMA, SKIN, SQUAMOUS CELL, FACE 04/19/2008   Hyperlipidemia, group D 01/27/2008   COLONIC POLYPS 11/15/2006   GERD 11/15/2006    Conditions to be addressed/monitored: Dementia. Limited Access to Food, Level of Care Concerns, ADL/IADL Limitations, Limited Access to Caregiver, Cognitive Deficits, Memory Deficits and Lacks Knowledge of [employer].  Care Plan : LCSW Plan of Care.  Updates made by Francis Gaines, LCSW since 01/17/2021 12:00 AM     Problem: Find Help in My Community.   Priority: High     Goal: Find Help in My Community.   Start Date: 01/17/2021  Expected End Date: 03/20/2021  This Visit's Progress: On track  Priority: High  Note:   Current Barriers:   Patient with Dysphagia, Diabetes Mellitus Type II, Parkinson's Disease, Dementia Due to Parkinson's Disease Without Behavioral Disturbance, Seizure Disorder, Gait Instability and History of Falls. Patient is unable to self-administer medications as prescribed. Patient is unable to consistently perform ADL's/IADL's independently. Level of care concerns, as patient now requires 24 hour care and supervision. Lacks knowledge of available community agencies and resources. Lacks caregiver support to prepare 3 meals per day. Clinical Goals:  Over the next 45 to 60 days, patient will have increased hours of in-home care services in place and will receive prepared meal delivery services through [place] with [employer] of [place].     Patient and daughter will work with [employer], [employer], Forensic psychologist, Department of Orthoptist and [employer] of [place] to coordinate care for patient.   Patient and daughter have been encouraged to apply for Adult Medicaid, through the [place]. Patient and daughter will review 2021 Medicaid Tips and complete application for Adult Medicaid, notifying LCSW if they require assistance with application completion and/or submission. Patient and daughter will review Personal Care Services Instructions and List of [place] Providers, and complete application for [place], notifying LCSW if they require assistance with application completion and/or submission. Patient will demonstrate improved health management independence as evidenced by having in-home care services in place. Clinical Interventions: LCSW placed referral for patient to Meals-on-Wheels through the [place]. Patient and daughter interviewed and appropriate assessments performed. Collaboration with Nurse Practitioner, [name] regarding development and update of comprehensive plan of care as evidenced by provider attestation and co-signature. Inter-disciplinary care team collaboration (see longitudinal plan of care). Interventions performed:  Problem Solving/Task Centered, Psychoeducation/Health Education, Quality of Sleep Assessed and Sleep Hygiene Techniques Promoted, Caregiver Stress Acknowledged and Consideration of In-Home Care Services Encouraged. Provided patient and daughter with resource information and applications, via e-mail ([email or web address]), per daughter's request. Discussed plans with patient and daughter for ongoing care management follow-up and provided direct contact information for care management team. Collaborated with Nurse Practitioner, [name] regarding need for review  and signature of [place] application. Assisted patient and daughter with obtaining information about health plan benefits through [place]  Healthcare Medicare. Provided education to patient and daughter regarding level of care options. Assessed needs, level of care concerns, basic eligibility and provided education on Personal Care Service process, In-Home Aide Service process, Adult Medicaid process and Meal-on-Wheels referral process.   Personal Care Services application will be faxed to [employer] for processing, once completed and signed by Designer, jewellery, [name].   LCSW collaboration with [employer] to verify application is received and processed. LCSW placed referral for patient to [employer]PS Resources Program by contacting Rosealee Albee, LCSW with the Department of Health and [employer]. Identified resources and durable medical equipment needed in the home to improve safety and promote independence. Patient Goals/Self-Care Activities:  Review Personal Care Service information, application and agency provider list with daughter and obtain assistance with application completion and submission to [employer]. Review In-Home Aide Service information and application with daughter and obtain assistance with application completion and submission to the Department of Health and [employer].   Review 2021 Medicaid Tips and application with daughter and obtain assistance with application completion and submission to the [place]. Camera operator of [place], [place] Department, to periodically check the status of your application for services, placed by LCSW on 01/17/2021. Select 2 to 3 agencies, from the [place] Provider list, that you would like to use, and keep this list until your assessment is completed by [employer] and you have been approved for services. Work with [employer] on a weekly basis to try and obtain personal care services, in-home aide services, adult Medicaid and Meals-on-Wheels. Follow-Up:  01/24/2021 at 2:30pm.      Follow-Up Plan:  LCSW will follow-up with patient by phone on 01/24/2021 at 2:30pm.      Nat Christen LCSW Licensed Clinical Social Worker [place] [phone]

## 2021-01-19 ENCOUNTER — Other Ambulatory Visit: Payer: Self-pay | Admitting: Neurology

## 2021-01-20 ENCOUNTER — Telehealth: Payer: Self-pay | Admitting: Neurology

## 2021-01-20 ENCOUNTER — Other Ambulatory Visit: Payer: Self-pay

## 2021-01-20 MED ORDER — LACOSAMIDE 100 MG PO TABS: 1.0000 | ORAL_TABLET | Freq: Two times a day (BID) | ORAL | 5 refills | Status: DC

## 2021-01-20 NOTE — Telephone Encounter (Signed)
Pt's son called in and spoke with Access Nurse. [facility].

## 2021-01-20 NOTE — Telephone Encounter (Signed)
Rx was sent in June.  I will resend it.

## 2021-01-20 NOTE — Telephone Encounter (Signed)
Unable to leave message[facility]

## 2021-01-20 NOTE — Telephone Encounter (Signed)
Faxed to [address]

## 2021-01-20 NOTE — Telephone Encounter (Signed)
Pt's son called back in and stated he needs the V[facility] [address] [facility] [place]

## 2021-01-22 NOTE — Addendum Note (Signed)
Addended by: Nat Christen D on: 01/22/2021 11:38 AM   Modules accepted: Orders

## 2021-01-24 ENCOUNTER — Ambulatory Visit: Payer: Medicare Other | Admitting: *Deleted

## 2021-01-24 ENCOUNTER — Telehealth: Payer: Self-pay | Admitting: Adult Health

## 2021-01-24 DIAGNOSIS — R296 Repeated falls: Secondary | ICD-10-CM

## 2021-01-24 DIAGNOSIS — G20 Parkinson's disease: Secondary | ICD-10-CM

## 2021-01-24 DIAGNOSIS — F0280 Dementia in other diseases classified elsewhere without behavioral disturbance: Secondary | ICD-10-CM

## 2021-01-24 DIAGNOSIS — E119 Type 2 diabetes mellitus without complications: Secondary | ICD-10-CM

## 2021-01-24 DIAGNOSIS — R4189 Other symptoms and signs involving cognitive functions and awareness: Secondary | ICD-10-CM

## 2021-01-24 DIAGNOSIS — W19XXXA Unspecified fall, initial encounter: Secondary | ICD-10-CM

## 2021-01-24 DIAGNOSIS — G40409 Other generalized epilepsy and epileptic syndromes, not intractable, without status epilepticus: Secondary | ICD-10-CM

## 2021-01-24 DIAGNOSIS — R2681 Unsteadiness on feet: Secondary | ICD-10-CM

## 2021-01-24 NOTE — Patient Instructions (Signed)
Visit Information  PATIENT GOALS:  Goals Addressed             This Visit's Progress    Find Help in My Community.   On track    Timeframe:  Short-Term Goal Priority:  High Start Date:   01/17/2021                          Expected End Date:   03/20/2021                    Follow-Up Date:  02/08/2021 at 1:30pm  Patient Goals/Self-Care Activities: Submit Personal Care Service application to [employer] for processing. Submit In-Home Aide Service application to the Department of Health and [employer] for processing.   Submit Adult Medicaid application to the [place] for processing.   Camera operator of [employer] ([place] Department) to periodically check the status of your application, submitted on 01/17/2021. Select 2 to 3 agencies, from the [place] Provider list, that you would like to use, and keep this list until your assessment is completed by [employer] and you have been approved for services. Work with [employer] on a weekly/bi-weekly basis to try and obtain [place], In-Home [employer], and Adult [place]. Referral placed to Care Guide to arrange Meals-on-Wheels through [employer] of [place]. Continue to receive private agency sitter services from 1:00 pm - 5:00 pm, Monday through Friday.        Patient verbalizes understanding of instructions provided today and agrees to view in [place].   Telephone follow-up appointment with care management team member scheduled for:  02/08/2021 at 1:30pm.  Nat Christen LCSW Licensed Clinical Social Worker [place] [phone]

## 2021-01-24 NOTE — Telephone Encounter (Signed)
   Telephone encounter was:  Unsuccessful.  01/24/2021 Name: Gerald Leach MRN: [id number] DOB: 1934/05/07  Unsuccessful outbound call made today to assist with:  Food Insecurity  Outreach Attempt:  1st Attempt  A HIPAA compliant voice message was left requesting a return call.  Instructed patient to call back at [phone].  April Green Care Guide, Embedded Care Coordination [place], Care Management Phone: [phone] Email: [email or web address]

## 2021-01-24 NOTE — Chronic Care Management (AMB) (Signed)
Chronic Care Management    Clinical Social Work Note  01/24/2021 Name: Gerald Leach MRN: [id number] DOB: July 12, 1933  Gerald Leach is a 87 y.o. year old male who is a primary care patient of [name], NP. The CCM team was consulted to assist the patient with chronic disease management and/or care coordination needs related to: Level of Care Concerns.   Engaged with patient's daughter by telephone for follow-up visit in response to provider referral for social work chronic care management and care coordination services.   Consent to Services:  The patient was given information about Chronic Care Management services, agreed to services, and gave verbal consent prior to initiation of services.  Please see initial visit note for detailed documentation.   Patient agreed to services and consent obtained.   Assessment: Review of patient past medical history, allergies, medications, and health status, including review of relevant consultants reports was performed today as part of a comprehensive evaluation and provision of chronic care management and care coordination services.     SDOH (Social Determinants of Health) assessments and interventions performed:    Advanced Directives Status: Not addressed in this encounter.  CCM Care Plan  No Known Allergies  Outpatient Encounter Medications as of 01/24/2021  Medication Sig   amantadine (SYMMETREL) 100 MG capsule TAKE 1 CAPSULE BY MOUTH EVERY DAY (Patient taking differently: Take 100 mg by mouth daily.)   carbidopa-levodopa (SINEMET IR) 25-100 MG tablet TAKE 1 TABLET BY MOUTH THREE TIMES DAILY   Lacosamide (VIMPAT) 100 MG TABS Take 1 tablet (100 mg total) by mouth 2 (two) times daily.   levETIRAcetam (KEPPRA) 750 MG tablet TAKE 2 TABLETS (1,500 MG TOTAL) BY MOUTH 2 (TWO) TIMES DAILY.   levothyroxine (SYNTHROID) 75 MCG tablet TAKE 1 TABLET BY MOUTH EVERY DAY   metFORMIN (GLUCOPHAGE) 500 MG tablet TAKE 1 TABLET (500 MG TOTAL) BY MOUTH 2 (TWO)  TIMES DAILY WITH A MEAL. NEED APPOINTMENT   omeprazole (PRILOSEC) 20 MG capsule Take 1 capsule (20 mg total) by mouth daily.   No facility-administered encounter medications on file as of 01/24/2021.    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 ([place]) 08/16/2020   Fall 08/11/2020   Closed compression fracture of L5 lumbar vertebra, initial encounter ([place]) 08/11/2020   Seizures ([place]) 03/05/2018   Leukocytosis 03/05/2018   Diabetic foot ulcers ([place]) 04/23/2017   Parkinson disease ([place])    Gait instability    Diabetes mellitus type 2, controlled ([place]) 08/05/2015   Hypothyroidism 08/05/2015   Fall at home 10/02/2014   CNS aneurysm s/p coiling ([facility], 2002) 10/02/2014   High grade T7 central canal stenosis with T7 vertebral fracture s/p fall (02/2012) 10/02/2014   Right clinoid calcified meningioma vs thrombosed giant ICA aneurysm, conservative management. 10/02/2014   Syncope 02/05/2014   Drug-induced delirium(292.81) 05/16/2013   Seizure disorder ([place]) 04/21/2013   Seizure disorder, grand mal ([place]) 03/24/2013   Cervical compression fracture---C7 10/29/2012   Dysphagia 09/21/2012   Subdural hematoma ([place]) 09/20/2012   SOLAR KERATOSIS 02/17/2009   CARCINOMA, SKIN, SQUAMOUS CELL, FACE 04/19/2008   Hyperlipidemia, group D 01/27/2008   COLONIC POLYPS 11/15/2006   GERD 11/15/2006    Conditions to be addressed/monitored: Dementia.  Level of Care Concerns, ADL/IADL Limitations, Limited Access to Caregiver, Cognitive Deficits, Memory Deficits, and Lacks Knowledge of [employer].  Care Plan : LCSW Plan of Care.  Updates made by Francis Gaines, LCSW since 01/24/2021  12:00 AM     Problem: Find Help in My Community.   Priority: High     Goal: Find Help in My Community.   Start Date: 01/17/2021  Expected End Date: 03/20/2021  This Visit's Progress: On track  Recent Progress: On track  Priority: High   Note:   Current Barriers:   Patient with Dysphagia, Diabetes Mellitus Type II, Parkinson's Disease, Dementia Due to Parkinson's Disease Without Behavioral Disturbance, Seizure Disorder, Gait Instability and History of Falls. Patient is unable to self-administer medications as prescribed. Patient is unable to consistently perform ADL's/IADL's independently. Level of care concerns, as patient now requires 24 hour care and supervision. Lacks knowledge of available community agencies and resources. Lacks caregiver support to prepare 3 meals per day. Clinical Goals:  Over the next 45 to 60 days, patient will have increased hours of in-home care services in place and will receive prepared meal delivery services through [place] with [employer] of [place].    Patient and daughter will work with [employer], [employer], Forensic psychologist, Department of Orthoptist and [employer] of [place] to coordinate care for patient.   Patient and daughter have been encouraged to apply for Adult Medicaid, through the [place]. Patient and daughter will review 2021 Medicaid Tips and complete application for Adult Medicaid, notifying LCSW if they require assistance with application completion and/or submission. Patient and daughter will review Personal Care Services Instructions and List of [place] Providers, and complete application for [place], notifying LCSW if they require assistance with application completion and/or submission. Patient will demonstrate improved health management independence as evidenced by having in-home care services in place. Clinical Interventions: LCSW placed referral for patient to Meals-on-Wheels through the [place]. Patient and daughter interviewed and appropriate assessments performed. Collaboration with Nurse Practitioner, [name]  regarding development and update of comprehensive plan of care as evidenced by provider attestation and co-signature. Inter-disciplinary care team collaboration (see longitudinal plan of care). Interventions performed:  Problem Solving/Task Centered, Psychoeducation/Health Education, Quality of Sleep Assessed and Sleep Hygiene Techniques Promoted, Caregiver Stress Acknowledged and Consideration of In-Home Care Services Encouraged. Provided patient and daughter with resource information and applications, via e-mail ([email or web address]), per daughter's request. Discussed plans with patient and daughter for ongoing care management follow-up and provided direct contact information for care management team. Collaborated with Nurse Practitioner, [phone] regarding need for review and signature of [place] application. Assisted patient and daughter with obtaining information about health plan benefits through [address]. Provided education to patient and daughter regarding level of care options. Assessed needs, level of care concerns, basic eligibility and provided education on Personal Care Service process, In-Home Aide Service process, Adult Medicaid process and Meal-on-Wheels referral process.   Personal Care Services application will be faxed to [employer] for processing, once completed and signed by [employer], Designer, jewellery.   LCSW collaboration with [name] to verify application is received and processed. LCSW placed referral for patient to [employer] Program by contacting [employer], LCSW with the Department of Health and Rosealee Albee. Identified resources and durable medical equipment needed in the home to improve safety and promote independence. Patient Goals/Self-Care Activities:  Submit Personal Care Service application to [employer] for processing. Submit In-Home Aide  Service application to the Department of Health and [employer] for processing.   Submit Adult Medicaid application to the [facility]  of Social Services for processing.   Camera operator of [employer] ([place] Department) to periodically check the status of your application, submitted on 01/17/2021. Select 2 to 3 agencies, from the [place] Provider list, that you would like to use, and keep this list until your assessment is completed by [employer] and you have been approved for services. Work with [employer] on a weekly/bi-weekly basis to try and obtain [place], In-Home [employer], and Adult [place]. Referral placed to Care Guide to arrange Meals-on-Wheels through [employer] of [place]. Continue to receive private agency sitter services from 1:00 pm - 5:00 pm, Monday through Friday. Follow-Up:  02/08/2021 at 1:30pm      Follow-Up Date:  02/08/2021 at 1:30pm      Nat Christen LCSW Licensed Clinical Social Worker [place] [phone]

## 2021-01-27 ENCOUNTER — Telehealth: Payer: Self-pay | Admitting: Neurology

## 2021-01-27 ENCOUNTER — Telehealth: Payer: Self-pay | Admitting: Adult Health

## 2021-01-27 ENCOUNTER — Ambulatory Visit: Payer: Medicare Other

## 2021-01-27 DIAGNOSIS — R569 Unspecified convulsions: Secondary | ICD-10-CM

## 2021-01-27 DIAGNOSIS — G20 Parkinson's disease: Secondary | ICD-10-CM

## 2021-01-27 DIAGNOSIS — E119 Type 2 diabetes mellitus without complications: Secondary | ICD-10-CM

## 2021-01-27 DIAGNOSIS — W19XXXD Unspecified fall, subsequent encounter: Secondary | ICD-10-CM

## 2021-01-27 DIAGNOSIS — F0280 Dementia in other diseases classified elsewhere without behavioral disturbance: Secondary | ICD-10-CM

## 2021-01-27 MED ORDER — LACOSAMIDE 100 MG PO TABS: 1.0000 | ORAL_TABLET | Freq: Two times a day (BID) | ORAL | 5 refills | Status: DC

## 2021-01-27 NOTE — Progress Notes (Signed)
Pt is scheduled for 10/03 I will call sooner if I have a cancellation  Thank you  Tati

## 2021-01-27 NOTE — Telephone Encounter (Signed)
His seizures occurred after missing medications and therefore increasing the dose of vimpat may not help.  It is more important that he always take the medication regularly.  Let's see how he does on regular dose of Vimpat '100mg'$  twice daily.  If he has any seizures while compliant with his medications, we can certainly increase the dose.   I sent prescription for Vimpat '100mg'$  BID on 8/[facility] [address]  Does [facility] want [facility] [address]?

## 2021-01-27 NOTE — Chronic Care Management (AMB) (Signed)
  Chronic Care Management   Note  01/27/2021 Name: Gerald Leach MRN: [id number] DOB: 01-08-1934  Gerald Leach is a 87 y.o. year old male who is a primary care patient of [name], NP. I reached out to Gerald Leach by phone today in response to a referral sent by [name]'s PCP, [name], NP.   Mr. Mode was given information about Chronic Care Management services today including:  CCM service includes personalized support from designated clinical staff supervised by his physician, including individualized plan of care and coordination with other care providers 24/7 contact phone numbers for assistance for urgent and routine care needs. Service will only be billed when office clinical staff spend 20 minutes or more in a month to coordinate care. Only one practitioner may furnish and bill the service in a calendar month. The patient may stop CCM services at any time (effective at the end of the month) by phone call to the office staff.   [name]  verbally agreed to assistance and services provided by embedded care coordination/care management team today.  Follow up plan:   [name]

## 2021-01-27 NOTE — Chronic Care Management (AMB) (Signed)
Chronic Care Management   CCM RN Visit Note  01/27/2021 Name: Gerald Leach MRN: [id number] DOB: 1933/12/17  Subjective: Gerald Leach is a 87 y.o. year old male who is a primary care patient of [name], NP. The care management team was consulted for assistance with disease management and care coordination needs.    Engaged with patient by telephone for initial visit in response to provider referral for case management and/or care coordination services.   Consent to Services:  The patient was given the following information about Chronic Care Management services today, agreed to services, and gave verbal consent: 1. CCM service includes personalized support from designated clinical staff supervised by the primary care provider, including individualized plan of care and coordination with other care providers 2. 24/7 contact phone numbers for assistance for urgent and routine care needs. 3. Service will only be billed when office clinical staff spend 20 minutes or more in a month to coordinate care. 4. Only one practitioner may furnish and bill the service in a calendar month. 5.The patient may stop CCM services at any time (effective at the end of the month) by phone call to the office staff. 6. The patient will be responsible for cost sharing (co-pay) of up to 20% of the service fee (after annual deductible is met). Patient agreed to services and consent obtained.  Patient agreed to services and verbal consent obtained.   Assessment: Review of patient past medical history, allergies, medications, health status, including review of consultants reports, laboratory and other test data, was performed as part of comprehensive evaluation and provision of chronic care management services.   SDOH (Social Determinants of Health) assessments and interventions performed:    CCM Care Plan  No Known Allergies  Outpatient Encounter Medications as of 01/27/2021  Medication Sig   amantadine  (SYMMETREL) 100 MG capsule TAKE 1 CAPSULE BY MOUTH EVERY DAY (Patient taking differently: Take 100 mg by mouth daily.)   carbidopa-levodopa (SINEMET IR) 25-100 MG tablet TAKE 1 TABLET BY MOUTH THREE TIMES DAILY   Lacosamide (VIMPAT) 100 MG TABS Take 1 tablet (100 mg total) by mouth 2 (two) times daily.   levETIRAcetam (KEPPRA) 750 MG tablet TAKE 2 TABLETS (1,500 MG TOTAL) BY MOUTH 2 (TWO) TIMES DAILY.   levothyroxine (SYNTHROID) 75 MCG tablet TAKE 1 TABLET BY MOUTH EVERY DAY   metFORMIN (GLUCOPHAGE) 500 MG tablet TAKE 1 TABLET (500 MG TOTAL) BY MOUTH 2 (TWO) TIMES DAILY WITH A MEAL. NEED APPOINTMENT   omeprazole (PRILOSEC) 20 MG capsule Take 1 capsule (20 mg total) by mouth daily.   No facility-administered encounter medications on file as of 01/27/2021.    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 ([place]) 08/16/2020   Fall 08/11/2020   Closed compression fracture of L5 lumbar vertebra, initial encounter ([place]) 08/11/2020   Seizures ([place]) 03/05/2018   Leukocytosis 03/05/2018   Diabetic foot ulcers ([place]) 04/23/2017   Parkinson disease ([place])    Gait instability    Diabetes mellitus type 2, controlled ([place]) 08/05/2015   Hypothyroidism 08/05/2015   Fall at home 10/02/2014   CNS aneurysm s/p coiling ([facility], 2002) 10/02/2014   High grade T7 central canal stenosis with T7 vertebral fracture s/p fall (02/2012) 10/02/2014   Right clinoid calcified meningioma vs thrombosed giant ICA aneurysm, conservative management. 10/02/2014   Syncope 02/05/2014   Drug-induced delirium(292.81) 05/16/2013   Seizure disorder ([place]) 04/21/2013   Seizure disorder, grand mal (  [place]) 03/24/2013   Cervical compression fracture---C7 10/29/2012   Dysphagia 09/21/2012   Subdural hematoma ([place]) 09/20/2012   SOLAR KERATOSIS 02/17/2009   CARCINOMA, SKIN, SQUAMOUS CELL, FACE 04/19/2008   Hyperlipidemia, group D 01/27/2008   COLONIC POLYPS 11/15/2006    GERD 11/15/2006    Conditions to be addressed/monitored:DMII, Dementia, and Parkinson"s disease, seizures  Care Plan : RNCM:Neurological disease( Parkinson's disease, seizures, dementia)  Updates made by Dimitri Ped, RN since 01/27/2021 12:00 AM     Problem: Fall Risk related to Neurological disease( Parkinson's disease, seizures, dementia)   Priority: High     Long-Range Goal: Reduction of Fall and Fall-Related Injury due to Neurological disease( Parkinson's disease, seizures, dementia)   Start Date: 01/27/2021  Expected End Date: 06/18/2021  This Visit's Progress: On track  Priority: High  Note:   Current Barriers:  Knowledge Deficits related to fall precautions in patient with Neurological disease( Parkinson's disease, seizures, dementia) Decreased adherence to prescribed treatment for fall prevention Unable to independently self manage Neurological disease( Parkinson's disease, seizures, dementia) Unable to perform ADLs independently Unable to perform IADLs independently Knowledge Deficits related to self management of Neurological disease( Parkinson's disease, seizures, dementia) and fall prevention Chronic Disease Management support and education needs related to self management of Neurological disease( Parkinson's disease, seizures, dementia) and fall prevention Cognitive Deficits Spoke with daughter Gerald Leach and son Gerald Leach.  States that pt has had numerous falls in the past due to his Parkinson's disease and dementia.  States he does use a walker.  States he has a paid caregiver from 1-5 who helps with cooking, cleaning and reminds pt to take his medications.  States pt sleeps a lot but will get up to eat.  Son and daughter share duties caring for pt.  Pt's wife is in memory care and they are worried that they will run out of money in the future. States they fill a automated pill box with 2 weeks worth of medications for pt to take twice a day.  States he  sometimes will not take his morning pills if he thinks it does not look right.  States pt has had seizures if he misses his seizure medications Clinical Goal(s):  patient will demonstrate improved adherence to prescribed treatment plan for decreasing falls as evidenced by patient reporting and review of EMR patient will verbalize using fall risk reduction strategies discussed patient will not experience additional falls patient will verbalize understanding of plan for self management of Neurological disease( Parkinson's disease, seizures, dementia) and fall prevention patient will meet with RN Care Manager to address self management of Neurological disease( Parkinson's disease, seizures, dementia) and fall prevention patient will take all medications exactly as prescribed and will call provider for medication related questions patient will demonstrate improved adherence to prescribed treatment plan for self management of Neurological disease( Parkinson's disease, seizures, dementia) and fall prevention patient will verbalize basic understanding of self management of Neurological disease( Parkinson's disease, seizures, dementia) and fall prevention disease process and self health management plan patient will work with CM team pharmacist to address cost of medications and adherence issues patient will work with CM clinical social worker to provide caregiver support and resources-LCSW next call 02/08/21 Interventions:  Collaboration with [name], NP regarding development and update of comprehensive plan of care as evidenced by provider attestation and co-signature Inter-disciplinary care team collaboration (see longitudinal plan of care) Provided written and verbal education re: Potential causes of falls and Fall prevention strategies Reviewed medications and  discussed potential side effects of medications such as dizziness and frequent urination Assessed for s/s of orthostatic  hypotension Assessed for falls since last encounter. Assessed patients knowledge of fall risk prevention secondary to previously provided education. Assessed working status of life alert bracelet and patient adherence Provided patient information for fall alert systems Evaluation of current treatment plan related to self management of Neurological disease( Parkinson's disease, seizures, dementia) and fall prevention and patient's adherence to plan as established by provider. Provided education to patient re: self management of Neurological disease( Parkinson's disease, seizures, dementia) and fall prevention Provided patient with verbal and written educational materials related to self management of Neurological disease( Parkinson's disease, seizures, dementia) and fall prevention Social Work referral for provide caregiver support and resources-LCSW next call 02/08/21 Pharmacy referral for to address cost of medications and adherence issues Discussed plans with patient for ongoing care management follow up and provided patient with direct contact information for care management team Self-Care Deficits:  Unable to self administer medications as prescribed Unable to perform ADLs independently Unable to perform IADLs independently Patient Goals:  - Utilize walker (assistive device) appropriately with all ambulation - De-clutter walkways - Change positions slowly - Wear secure fitting shoes at all times with ambulation - Utilize home lighting for dim lit areas - Demonstrate self and pet awareness at all times - always use handrails on the stairs - install bathroom grab bars - keep a flashlight by the bed - keep cell phone with me always - make an emergency alert plan in case I fall - pick up clutter from the floors - remove throw rugs or use nonslip pads - use a cane or walker - use nightlight in the bathroom, halls - always wear low-heeled or flat shoes or slippers with nonskid  soles Follow Up Plan: Telephone follow up appointment with care management team member scheduled for: 03/03/21 at 9 AM The patient has been provided with contact information for the care management team and has been advised to call with any health related questions or concerns.      Care Plan : RNCM:Diabetes Type 2 (Adult)  Updates made by Dimitri Ped, RN since 01/27/2021 12:00 AM     Problem: Lack of long term self managment of  Type 2 Diabetes   Priority: Medium     Long-Range Goal: Effective long term self managment of  Type 2 Diabetes   Start Date: 01/27/2021  Expected End Date: 06/18/2021  This Visit's Progress: On track  Priority: Medium  Note:   Objective:  Lab Results  Component Value Date   HGBA1C 5.6 08/11/2020   Lab Results  Component Value Date   CREATININE 0.85 08/23/2020   CREATININE 0.74 08/12/2020   CREATININE 0.87 08/11/2020   No results found for: EGFR Current Barriers:  Knowledge Deficits related to basic Diabetes pathophysiology and self care/management Knowledge Deficits related to medications used for management of diabetes Cognitive Deficits Does not use cbg meter  Unable to independently self manage Type 2 Diabetes Unable to perform ADLs independently Unable to perform IADLs independently Spoke with daughter Gerald Leach and son Gerald Leach Designated Party Release.  States that pts diabetes has improved since pt has lost weight and he is not eating as much.  States he does not check his CBGs.  States he does drink a sugar sweetened soda daily.  States pt will eat breakfast, not much for lunch and a good supper of a meat and 2 vegetables.  States he does  drink Ensure sometimes. Case Manager Clinical Goal(s):  patient will demonstrate improved adherence to prescribed treatment plan for diabetes self care/management as evidenced by: adherence to ADA/ carb modified diet adherence to prescribed medication regimen contacting provider for new or worsened  symptoms or questions Interventions:  Collaboration with Carlisle Cater, Tommi Rumps, NP regarding development and update of comprehensive plan of care as evidenced by provider attestation and co-signature Inter-disciplinary care team collaboration (see longitudinal plan of care) Provided education to patient about basic DM disease process Reviewed medications with patient and discussed importance of medication adherence Discussed plans with patient for ongoing care management follow up and provided patient with direct contact information for care management team Provided patient with written educational materials related to hypo and hyperglycemia and importance of correct treatment Referral made to pharmacy team for assistance with issues with cost of medications and adherence  Referral made to social work team for assistance with caregiver support, level of care issues and community resources next LCSW visit 02/08/21 Review of patient status, including review of consultants reports, relevant laboratory and other test results, and medications completed. Reviewed use diabetic nutritional supplements to support his nutritional needs Self-Care Activities - Self administers oral medications as prescribed Attends all scheduled provider appointments Adheres to prescribed ADA/carb modified Patient Goals:  - change to whole grain breads, cereal, pasta - drink 6 to 8 glasses of water each day - fill half of plate with vegetables - manage portion size - read food labels for fat, fiber, carbohydrates and portion size - reduce red meat to 2 to 3 times a week - switch to sugar-free drinks - schedule appointment with eye doctor - check feet daily for cuts, sores or redness - wash and dry feet carefully every day - wear comfortable, cotton socks - wear comfortable, well-fitting shoes Follow Up Plan: Telephone follow up appointment with care management team member scheduled for: 03/03/21 at 9 AM The patient has  been provided with contact information for the care management team and has been advised to call with any health related questions or concerns.       Plan:Telephone follow up appointment with care management team member scheduled for:  03/03/21 and The patient has been provided with contact information for the care management team and has been advised to call with any health related questions or concerns.  Peter Garter RN, Jackquline Denmark, CDE Care Management Coordinator [facility] [phone], Mobile [phone]

## 2021-01-27 NOTE — Telephone Encounter (Signed)
Rx sent as requested.

## 2021-01-27 NOTE — Patient Instructions (Addendum)
Visit Information   PATIENT GOALS:   Goals Addressed             This Visit's Progress    RNCM:Eat Healthy   On track    Timeframe:  Long-Range Goal Priority:  Medium Start Date:     01/27/21                        Expected End Date:     06/18/21                  Follow Up Date 03/03/21    - change to whole grain breads, cereal, pasta - drink 6 to 8 glasses of water each day - fill half of plate with vegetables - read food labels for fat, fiber, carbohydrates and portion size - switch to sugar-free drinks -drink diabetic nutritional supplement daily as needed    Why is this important?   When you are ready to manage your nutrition or weight, having a plan and setting goals will help.  Taking small steps to change how you eat and exercise is a good place to start.    Notes:      RNCM:Prevent Falls-Dementia   On track    Timeframe:  Long-Range Goal Priority:  High Start Date:         01/27/21                    Expected End Date:   06/18/21                   Follow Up Date 03/03/21    - always use handrails on the stairs - install bathroom grab bars - keep a flashlight by the bed - keep cell phone with me always - make an emergency alert plan in case I fall - pick up clutter from the floors - remove throw rugs or use nonslip pads - use a cane or walker - use nightlight in the bathroom, halls - always wear low-heeled or flat shoes or slippers with nonskid soles    Why is this important?   There may be trouble with balance and getting around. Falls can happen.    Notes:       It is important to avoid accidents which may result in broken bones.  Here are a few ideas on how to make your home safer so you will be less likely to trip or fall.  Use nonskid mats or non slip strips in your shower or tub, on your bathroom floor and around sinks.  If you know that you have spilled water, wipe it up! In the bathroom, it is important to have properly installed grab bars on the  walls or on the edge of the tub.  Towel racks are NOT strong enough for you to hold onto or to pull on for support. Stairs and hallways should have enough light.  Add lamps or night lights if you need ore light. It is good to have handrails on both sides of the stairs if possible.  Always fix broken handrails right away. It is important to see the edges of steps.  Paint the edges of outdoor steps white so you can see them better.  Put colored tape on the edge of inside steps. Throw-rugs are dangerous because they can slide.  Removing the rugs is the best idea, but if they must stay, add adhesive carpet tape to prevent slipping. Do not  keep things on stairs or in the halls.  Remove small furniture that blocks the halls as it may cause you to trip.  Keep telephone and electrical cords out of the way where you walk. Always were sturdy, rubber-soled shoes for good support.  Never wear just socks, especially on the stairs.  Socks may cause you to slip or fall.  Do not wear full-length housecoats as you can easily trip on the bottom.  Place the things you use the most on the shelves that are the easiest to reach.  If you use a stepstool, make sure it is in good condition.  If you feel unsteady, DO NOT climb, ask for help. If a health professional advises you to use a cane or walker, do not be ashamed.  These items can keep you from falling and breaking your bones. Type 2 Diabetes Mellitus, Self-Care, Adult When you have type 2 diabetes (type 2 diabetes mellitus), you must make sure your blood sugar (glucose) stays in a healthy range. You can do this with: Nutrition. Exercise. Lifestyle changes. Medicines or insulin, if needed. Support from your doctors and others. What are the risks? Having diabetes can raise your risk for other long-term (chronic) health problems. You may get medicines to help prevent these problems. How to stay aware of blood sugar  Check your blood sugar level every day, as often as  told. Have your A1C (hemoglobin A1C) level checked two or more times a year. Have it checked more often if told. Your doctor will set personal treatment goals for you. In general, you should have these blood sugar levels: Before meals: 80-130 mg/dL (4.4-7.2 mmol/L). After meals: below 180 mg/dL (10 mmol/L). A1C: less than 7%. How to manage high and low blood sugar Symptoms of high blood sugar High blood sugar is also called hyperglycemia. Know the symptoms of high blood sugar. These may include: More thirst. Hunger. Feeling very tired. Needing to pee (urinate) more often than normal. Seeing things blurry. Symptoms of low blood sugar Low blood sugar is also called hypoglycemia. This is when blood sugar is at or below 70 mg/dL (3.9 mmol/L). Symptoms may include: Hunger. Feeling worried or nervous (anxious). Feeling sweaty and cold to the touch (clammy). Being dizzy or light-headed. Feeling sleepy. A fast heartbeat. Feeling grouchy (irritable). Tingling or loss of feeling (numbness) around your mouth, lips, or tongue. Restless sleep. Diabetes medicines can cause low blood sugar. You are more at risk: While you exercise. After exercise. During sleep. When you are sick. When you skip meals or do not eat for a long time. Treating low blood sugar If you think you have low blood sugar, eat or drink something sugary right away. Keep 15 grams of a fast-acting carb (carbohydrate) with you all the time. Make sure your family and friends know how to treatyou if you cannot treat yourself. Treating very low blood sugar Severe hypoglycemia is when your blood sugar is at or below 54 mg/dL (3 mmol/L). Severe hypoglycemia is an emergency. Do not wait to see if the symptoms will go away. Get medical help right away. Call your local emergency services (911 in the [place] Do not drive yourself to the hospital. You may need a glucagon shot if you have very low blood sugar and you cannot eat or drink. Have  a family member or friend learn how to check your blood sugar and how to give you a glucagon shot. Ask your doctor if you should have akit for glucagon shots.  Follow these instructions at home: Medicines Take diabetes medicines as told. If your doctor prescribed insulin or diabetes medicines, take them each day. Do not run out of insulin or other medicines. Plan ahead. If you use insulin, change the amount you take based on how active you are and what foods you eat. Your doctor will tell you how to do this. Take over-the-counter and prescription medicines only as told by your doctor. Eating and drinking  Eat healthy foods. These include: Low-fat (lean) proteins. Complex carbs, such as whole grains. Fresh fruits and vegetables. Low-fat dairy products. Healthy fats. Meet with a food expert (dietitian) to make an eating plan. Follow instructions from your doctor about what you cannot eat or drink. Drink enough fluid to keep your pee (urine) pale yellow. Keep track of carbs that you eat. Read food labels and learn serving sizes of foods. Follow your sick-day plan when you cannot eat or drink as normal. Make this plan with your doctor so it is ready to use.  Activity Exercise as told by your doctor. You may need to: Do stretching and strength exercises 2 or more times a week. Do 150 minutes or more of exercise each week that makes your heart beat faster and makes you sweat. Spread out your exercise over 3 or more days a week. Do not go more than 2 days in a row without exercise. Talk with your doctor before you start a new exercise. Your doctor may tell you to change: How much insulin or medicines you take. How much food you eat. Lifestyle Do not use any products that contain nicotine or tobacco, such as cigarettes, e-cigarettes, and chewing tobacco. If you need help quitting, ask your doctor. If you drink alcohol and your doctor says alcohol is safe for you: Limit how much you use  to: 0-1 drink a day for women who are not pregnant. 0-2 drinks a day for men. Be aware of how much alcohol is in your drink. In the [place] one drink equals one 12 oz bottle of beer (355 mL), one 5 oz glass of wine (148 mL), or one 1 oz glass of hard liquor (44 mL). Learn to deal with stress. If you need help, ask your doctor. Body care  Stay up to date with your shots (immunizations). Have your eyes and feet checked by a doctor as often as told. Check your skin and feet every day. Check for cuts, bruises, redness, blisters, or sores. Brush your teeth and gums two times a day. Floss one or more times a day. Go to the dentist one or more times every 6 months. Stay at a healthy weight.  General instructions Share your diabetes care plan with: Your work or school. People you live with. Carry a card or wear jewelry that says you have diabetes. Keep all follow-up visits as told by your doctor. This is important. Questions to ask your doctor Do I need to meet with a certified expert in diabetes education and care? Where can I find a support group? Where to find more information American Diabetes Association: www.diabetes.org American Association of Diabetes Care and Education Specialists: www.diabeteseducator.org International Diabetes Federation: MemberVerification.ca Summary When you have type 2 diabetes, you must make sure your blood sugar (glucose) stays in a healthy range. You can do this with nutrition, exercise, medicines and insulin, and support from doctors and others. Check your blood sugar every day, or as often as told. Having diabetes can raise your risk for other long-term health  problems. You may get medicines to help prevent these problems. Share your diabetes management plan with people at work, school, and home. Keep all follow-up visits as told by your doctor. This is important. This information is not intended to replace advice given to you by your health care provider. Make sure  you discuss any questions you have with your healthcare provider. Document Revised: 05/18/2020 Document Reviewed: 01/06/2020 Elsevier Patient Education  2022 [employer].  Consent to CCM Services: Mr. Popwell was given information about Chronic Care Management services including:  CCM service includes personalized support from designated clinical staff supervised by his physician, including individualized plan of care and coordination with other care providers 24/7 contact phone numbers for assistance for urgent and routine care needs. Service will only be billed when office clinical staff spend 20 minutes or more in a month to coordinate care. Only one practitioner may furnish and bill the service in a calendar month. The patient may stop CCM services at any time (effective at the end of the month) by phone call to the office staff. The patient will be responsible for cost sharing (co-pay) of up to 20% of the service fee (after annual deductible is met).  Patient agreed to services and verbal consent obtained.   The patient verbalized understanding of instructions, educational materials, and care plan provided today and agreed to receive a mailed copy of patient instructions, educational materials, and care plan.   Telephone follow up appointment with care management team member scheduled for: 03/03/21 at 9 AM  [place], [facility], CDE Care Management Coordinator [facility] [phone], Mobile [phone]   CLINICAL CARE PLAN: Patient Care Plan: LCSW Plan of Care.     Problem Identified: Find Help in My Community.   Priority: High     Goal: Find Help in My Community.   Start Date: 01/17/2021  Expected End Date: 03/20/2021  This Visit's Progress: On track  Recent Progress: On track  Priority: High  Note:   Current Barriers:   Patient with Dysphagia, Diabetes Mellitus Type II, Parkinson's Disease, Dementia Due to Parkinson's Disease Without  Behavioral Disturbance, Seizure Disorder, Gait Instability and History of Falls. Patient is unable to self-administer medications as prescribed. Patient is unable to consistently perform ADL's/IADL's independently. Level of care concerns, as patient now requires 24 hour care and supervision. Lacks knowledge of available community agencies and resources. Lacks caregiver support to prepare 3 meals per day. Clinical Goals:  Over the next 45 to 60 days, patient will have increased hours of in-home care services in place and will receive prepared meal delivery services through [place] with [employer] [facility] [place].    Patient and daughter will work with [employer], [employer], Forensic psychologist, Department of Orthoptist and [employer] [facility] [place] to coordinate care for patient.   Patient and daughter have been encouraged to apply for Adult Medicaid, through the [place]. Patient and daughter will review 2021 Medicaid Tips and complete application for Adult Medicaid, notifying LCSW if they require assistance with application completion and/or submission. Patient and daughter will review Personal Care Services Instructions and List of [place] Providers, and complete application for [employer], notifying LCSW if they require assistance with application completion and/or submission. Patient will demonstrate improved health management independence as evidenced by having in-home care services in place. Clinical Interventions: LCSW placed referral for patient to Meals-on-Wheels through the [place]. Patient and  daughter interviewed and appropriate assessments performed. Collaboration with Nurse Practitioner, [name] regarding development and update of comprehensive plan of care as evidenced by provider attestation and co-signature. Inter-disciplinary care team  collaboration (see longitudinal plan of care). Interventions performed:  Problem Solving/Task Centered, Psychoeducation/Health Education, Quality of Sleep Assessed and Sleep Hygiene Techniques Promoted, Caregiver Stress Acknowledged and Consideration of In-Home Care Services Encouraged. Provided patient and daughter with resource information and applications, via e-mail ([email or web address]), per daughter's request. Discussed plans with patient and daughter for ongoing care management follow-up and provided direct contact information for care management team. Collaborated with Nurse Practitioner, [name] regarding need for review and signature of [place] application. Assisted patient and daughter with obtaining information about health plan benefits through [employer]. Provided education to patient and daughter regarding level of care options. Assessed needs, level of care concerns, basic eligibility and provided education on Personal Care Service process, In-Home Aide Service process, Adult Medicaid process and Meal-on-Wheels referral process.   Personal Care Services application will be faxed to [employer] for processing, once completed and signed by Designer, jewellery, [name].   LCSW collaboration with [employer] to verify application is received and processed. LCSW placed referral for patient to [employer] Resources Program by contacting Rosealee Albee, LCSW with the Department of Health and [employer]. Identified resources and durable medical equipment needed in the home to improve safety and promote independence. Patient Goals/Self-Care Activities:  Submit Personal Care Service application to [employer] for processing. Submit In-Home Aide Service application to the Department of Health and [employer] for processing.   Submit Adult Medicaid application to the [place] for processing.   Camera operator of [employer] ([employer] Department) to periodically check the status of your application, submitted on 01/17/2021. Select 2 to 3 agencies, from the [place] Provider list, that you would like to use, and keep this list until your assessment is completed by [employer] and you have been approved for services. Work with [employer] on a weekly/bi-weekly basis to try and obtain [place], In-Home [employer], and Adult [place]. Referral placed to Care Guide to arrange Meals-on-Wheels through [employer] [facility] [place]. Continue to receive private agency sitter services from 1:00 pm - 5:00 pm, Monday through Friday. Follow-Up:  02/08/2021 at 1:30pm    Patient Care Plan: RNCM:Neurological disease( Parkinson's disease, seizures, dementia)     Problem Identified: Fall Risk related to Neurological disease( Parkinson's disease, seizures, dementia)   Priority: High     Long-Range Goal: Reduction of Fall and Fall-Related Injury due to Neurological disease( Parkinson's disease, seizures, dementia)   Start Date: 01/27/2021  Expected End Date: 06/18/2021  This Visit's Progress: On track  Priority: High  Note:   Current Barriers:  Knowledge Deficits related to fall precautions in patient with Neurological disease( Parkinson's disease, seizures, dementia) Decreased adherence to prescribed treatment for fall prevention Unable to independently self manage Neurological disease( Parkinson's disease, seizures, dementia) Unable to perform ADLs independently Unable to perform IADLs independently Knowledge Deficits related to self management of Neurological disease( Parkinson's disease, seizures, dementia) and fall prevention Chronic Disease Management support and education needs related to self management of Neurological disease( Parkinson's disease, seizures, dementia) and  fall prevention Cognitive Deficits Spoke with daughter Yulian Gosney and son Arvid Marengo.  States that pt has had numerous falls in the past due to his Parkinson's disease and dementia.  States he does use a walker.  States he has a paid caregiver from 1-5 who helps with cooking, cleaning and reminds pt to take his medications.  States pt sleeps a lot but will get up to eat.  Son and daughter share duties caring for pt.  Pt's wife is in memory care and they are worried that they will run out of money in the future. States they fill a automated pill box with 2 weeks worth of medications for pt to take twice a day.  States he sometimes will not take his morning pills if he thinks it does not look right.  States pt has had seizures if he misses his seizure medications Clinical Goal(s):  patient will demonstrate improved adherence to prescribed treatment plan for decreasing falls as evidenced by patient reporting and review of EMR patient will verbalize using fall risk reduction strategies discussed patient will not experience additional falls patient will verbalize understanding of plan for self management of Neurological disease( Parkinson's disease, seizures, dementia) and fall prevention patient will meet with RN Care Manager to address self management of Neurological disease( Parkinson's disease, seizures, dementia) and fall prevention patient will take all medications exactly as prescribed and will call provider for medication related questions patient will demonstrate improved adherence to prescribed treatment plan for self management of Neurological disease( Parkinson's disease, seizures, dementia) and fall prevention patient will verbalize basic understanding of self management of Neurological disease( Parkinson's disease, seizures, dementia) and fall prevention disease process and self health management plan patient will work with CM team pharmacist to address cost of medications and adherence  issues patient will work with CM clinical social worker to provide caregiver support and resources-LCSW next call 02/08/21 Interventions:  Collaboration with [name], NP regarding development and update of comprehensive plan of care as evidenced by provider attestation and co-signature Inter-disciplinary care team collaboration (see longitudinal plan of care) Provided written and verbal education re: Potential causes of falls and Fall prevention strategies Reviewed medications and discussed potential side effects of medications such as dizziness and frequent urination Assessed for s/s of orthostatic hypotension Assessed for falls since last encounter. Assessed patients knowledge of fall risk prevention secondary to previously provided education. Assessed working status of life alert bracelet and patient adherence Provided patient information for fall alert systems Evaluation of current treatment plan related to self management of Neurological disease( Parkinson's disease, seizures, dementia) and fall prevention and patient's adherence to plan as established by provider. Provided education to patient re: self management of Neurological disease( Parkinson's disease, seizures, dementia) and fall prevention Provided patient with verbal and written educational materials related to self management of Neurological disease( Parkinson's disease, seizures, dementia) and fall prevention Social Work referral for provide caregiver support and resources-LCSW next call 02/08/21 Pharmacy referral for to address cost of medications and adherence issues Discussed plans with patient for ongoing care management follow up and provided patient with direct contact information for care management team Self-Care Deficits:  Unable to self administer medications as prescribed Unable to perform ADLs independently Unable to perform IADLs independently Patient Goals:  - Utilize walker (assistive device) appropriately  with all ambulation - De-clutter walkways - Change positions slowly - Wear secure fitting shoes at all times with ambulation - Utilize home lighting for dim lit areas - Demonstrate self and pet awareness at all times - always use handrails on the stairs - install bathroom grab bars - keep a flashlight by the bed - keep cell phone with me always -  make an emergency alert plan in case I fall - pick up clutter from the floors - remove throw rugs or use nonslip pads - use a cane or walker - use nightlight in the bathroom, halls - always wear low-heeled or flat shoes or slippers with nonskid soles Follow Up Plan: Telephone follow up appointment with care management team member scheduled for: 03/03/21 at 9 AM The patient has been provided with contact information for the care management team and has been advised to call with any health related questions or concerns.      Patient Care Plan: RNCM:Diabetes Type 2 (Adult)     Problem Identified: Lack of long term self managment of  Type 2 Diabetes   Priority: Medium     Long-Range Goal: Effective long term self managment of  Type 2 Diabetes   Start Date: 01/27/2021  Expected End Date: 06/18/2021  This Visit's Progress: On track  Priority: Medium  Note:   Objective:  Lab Results  Component Value Date   HGBA1C 5.6 08/11/2020   Lab Results  Component Value Date   CREATININE 0.85 08/23/2020   CREATININE 0.74 08/12/2020   CREATININE 0.87 08/11/2020   No results found for: EGFR Current Barriers:  Knowledge Deficits related to basic Diabetes pathophysiology and self care/management Knowledge Deficits related to medications used for management of diabetes Cognitive Deficits Does not use cbg meter  Unable to independently self manage Type 2 Diabetes Unable to perform ADLs independently Unable to perform IADLs independently Spoke with daughter Ziere Docken and son Essa Malachi Designated Party Release.  States that pts diabetes has  improved since pt has lost weight and he is not eating as much.  States he does not check his CBGs.  States he does drink a sugar sweetened soda daily.  States pt will eat breakfast, not much for lunch and a good supper of a meat and 2 vegetables.  States he does drink Ensure sometimes. Case Manager Clinical Goal(s):  patient will demonstrate improved adherence to prescribed treatment plan for diabetes self care/management as evidenced by: adherence to ADA/ carb modified diet adherence to prescribed medication regimen contacting provider for new or worsened symptoms or questions Interventions:  Collaboration with Carlisle Cater, Tommi Rumps, NP regarding development and update of comprehensive plan of care as evidenced by provider attestation and co-signature Inter-disciplinary care team collaboration (see longitudinal plan of care) Provided education to patient about basic DM disease process Reviewed medications with patient and discussed importance of medication adherence Discussed plans with patient for ongoing care management follow up and provided patient with direct contact information for care management team Provided patient with written educational materials related to hypo and hyperglycemia and importance of correct treatment Referral made to pharmacy team for assistance with issues with cost of medications and adherence  Referral made to social work team for assistance with caregiver support, level of care issues and community resources next LCSW visit 02/08/21 Review of patient status, including review of consultants reports, relevant laboratory and other test results, and medications completed. Reviewed use diabetic nutritional supplements to support his nutritional needs Self-Care Activities - Self administers oral medications as prescribed Attends all scheduled provider appointments Adheres to prescribed ADA/carb modified Patient Goals:  - change to whole grain breads, cereal, pasta - drink 6  to 8 glasses of water each day - fill half of plate with vegetables - manage portion size - read food labels for fat, fiber, carbohydrates and portion size - reduce red meat to 2 to 3  times a week - switch to sugar-free drinks - schedule appointment with eye doctor - check feet daily for cuts, sores or redness - wash and dry feet carefully every day - wear comfortable, cotton socks - wear comfortable, well-fitting shoes Follow Up Plan: Telephone follow up appointment with care management team member scheduled for: 03/03/21 at 9 AM The patient has been provided with contact information for the care management team and has been advised to call with any health related questions or concerns.

## 2021-01-27 NOTE — Telephone Encounter (Signed)
Per pt son, Pt had two mild seizures six weeks ago. But the pt son states he miss his medication in the past it doesn't really bother him but this time it did.   Son wanted to know if we should increase his dose of Vimpat.  Pt only missed the morning pills by that afternoon he had a seizure.   Please send script back to [facility] Please

## 2021-01-27 NOTE — Telephone Encounter (Signed)
Yes, Please send to [facility]

## 2021-01-27 NOTE — Telephone Encounter (Signed)
Patient son would like to get his vimpat called into the [facility] on [address]   Also patient son states that he would like to speak to someone about patient having 2 seizures in the last 6 weeks he states that the patient forgot to take his medication and had the seizures  please call

## 2021-01-27 NOTE — Telephone Encounter (Signed)
Patient son advise.

## 2021-01-31 ENCOUNTER — Telehealth: Payer: Self-pay | Admitting: Pharmacist

## 2021-01-31 NOTE — Chronic Care Management (AMB) (Signed)
Chronic Care Management Pharmacy Assistant   Name: Gerald Leach  MRN: [id number] DOB: 02-29-1934   Reason for Encounter: Chart Review for initial visit with Gerald Leach Clinical Pharmacist on 02-07-2021 at 3:30 via phone call.   Conditions to be addressed/monitored: DMII and GERD, Parkinson's and Hypothyroidism  Recent office visits:  01-24-2021 Gerald Ped, RN - Patient presented for Initial Outreach Chronic Care Management and coordination needs. No medication changes.  01-24-2021 Saporito, Maree Erie, LCSW - Patient presented for Dysphagia, Diabetes Mellitus Type II, Parkinson's Disease, Dementia Due to Parkinson's Disease Without Behavioral Disturbance, Seizure Disorder, Gait Instability and History of Falls. No medication changes.  01-17-2021 Saporito, Maree Erie, LCSW - Patient presented for Dysphagia, Diabetes Mellitus Type II, Parkinson's Disease, Dementia Due to Parkinson's Disease Without Behavioral Disturbance, Seizure Disorder, Gait Instability and History of Falls. No medication changes.  11-17-2020  Gerald Peng, NP - Patient presented for Impacted cerumen of right ear and other concerns. Prescribed Omeprazole 20 mg.   08-11-2020 Gerald Peng, NP - Patient presented for frequent falls. No medication changes.  Recent consult visits:  12-09-2020 Gerald Berthold, DO - Patient presented via tele health for Seizures and other concerns. No medication changes.  09-26-2020 Gerald Leach - Patient presented for NURSING FACIL CARE/DAY NEW PROBLEM 25 MIN no other visit details available.  09-22-2020 Gerald Leach - Patient presented for NURSING FACIL CARE/DAY NEW PROBLEM 25 MIN no other visit details available.  09-13-2020 Gerald Leach - Patient presented for NURSING FACIL CARE/DAY NEW PROBLEM 25 MIN no other visit details available.  09-09-2020 (08-29-2020 & 08-25-2020)  Gerald Leach - Patient presented for NURSING FACIL CARE/DAY  NEW PROBLEM 25 MIN no other visit details available.  08-19-2020 Gerald Blalock NP- Patient presented for PR SBSQ NURS FACIL CARE/DAY UNSTABL/NEW PROB 35 MIN. No other visit details available.  08-18-2020 Gerald Leach- Patient presented for [place] 45 MINUTES and HC  TRANSITIONAL CARE MANAGEMENT. NO other visit details available.   Hospital visits:  Medication Reconciliation was completed by comparing discharge summary, patient's EMR and Pharmacy list, and upon discussion with patient.  Patient presented to [facility] ED on  08-23-2020 due to Seizure. Patient was there for 17 hours.    New?Medications Started at [facility] Discharge:?? Given extra Vimpat 100  Medication Changes at Hospital Discharge: None  Medications Discontinued at Hospital Discharge: None Medications that remain the same after Hospital Discharge:??  -All other medications will remain the same.     Medication Reconciliation was completed by comparing discharge summary, patient's EMR and Pharmacy list, and upon discussion with patient.  Patient presented to [facility] on 08-11-2020 due to fall and fracture. Patient was there for 5 days.    New?Medications Started at [facility] Discharge:??   Medication Changes at Hospital Discharge: Metformin 500 mg take one 2 times daily with a meal Levothyroxine 75 mcg take one daily Vimpat 100 mg take one 2 times daily  Medications Discontinued at Hospital Discharge: Nystatin powder Omeprazole 20 mg Simvastatin 10 mg  Medications that remain the same after Hospital Discharge:??  -All other medications will remain the same.    Medications: Outpatient Encounter Medications as of 01/31/2021  Medication Sig   amantadine (SYMMETREL) 100 MG capsule TAKE 1 CAPSULE BY MOUTH EVERY DAY (Patient taking differently: Take 100 mg by mouth daily.)   carbidopa-levodopa (SINEMET IR) 25-100 MG tablet TAKE 1 TABLET BY  MOUTH THREE TIMES  DAILY   Lacosamide (VIMPAT) 100 MG TABS Take 1 tablet (100 mg total) by mouth 2 (two) times daily.   levETIRAcetam (KEPPRA) 750 MG tablet TAKE 2 TABLETS (1,500 MG TOTAL) BY MOUTH 2 (TWO) TIMES DAILY.   levothyroxine (SYNTHROID) 75 MCG tablet TAKE 1 TABLET BY MOUTH EVERY DAY   metFORMIN (GLUCOPHAGE) 500 MG tablet TAKE 1 TABLET (500 MG TOTAL) BY MOUTH 2 (TWO) TIMES DAILY WITH A MEAL. NEED APPOINTMENT   omeprazole (PRILOSEC) 20 MG capsule Take 1 capsule (20 mg total) by mouth daily.   No facility-administered encounter medications on file as of 01/31/2021.  Call to son Gerald Leach for the call [phone]  Have you seen any other providers since your last visit? He reports no.  Any changes in your medications or health? He reports no changes.  Any side effects from any medications? He reports he nas not noticed any side effects.  Do you have an symptoms or problems not managed by your medications? He reports he had an episode when he had missed his morning pills for about 6 weeks that included a seizure medication and did indeed have a seizure not to long afterwards He takes the seizure medication 2x a day and had still been taking the afternoon dose  Any concerns about your health right now? He is concerned that in the near future his seizure medication may need increasing, he had been without his seizure medication in the past and had no effect.  Has your provider asked that you check blood pressure, blood sugar, or follow special diet at home? He reports he is supposed to be checking sugars but does not do so regularly when they do check it has been ranging between 100-110  Do you get any type of exercise on a regular basis? He reports he uses a walker and does not get around very well.  Can you think of a goal you would like to reach for your health? He reports he would like to be able to drive again.  Do you have any problems getting your medications? He reports they are happy  with their current pharmacy.  Is there anything that you would like to discuss during the appointment? He reports not at this time.  He is aware to have available bring medications and supplements available for the call and that they will be reminded the day prior.  Fill History: AMANTADINE 100 MG CAPSULE 12/02/2020 90   CARBIDOPA-LEVODOPA 25-100 TAB 12/25/2020 90   LACOSAMIDE 100 MG TABLET 01/20/2021 14   LEVOTHYROXINE 75 MCG TABLET 01/13/2021 90   OMEPRAZOLE DR 20 MG CAPSULE 11/17/2020 90   LEVETIRACETAM 750 MG TABLET 09/17/2020 30   METFORMIN HCL 500 MG TABLET 12/15/2020 90   Care Gaps: COVID Vaccines - Overdue Zoster Vaccines - Overdue Eye Exam - Overdue TDAP - Overdue Urine Micro - Overdue Foot Exam - Overdue Flu Vaccine - Overdue  Star Rating Drugs: Metformin (Glucophage) 500 mg - Last filled 12-15-2020 90 DS at [facility] Pharmacist Assistant [phone]

## 2021-02-01 ENCOUNTER — Telehealth: Payer: Self-pay | Admitting: Adult Health

## 2021-02-01 NOTE — Telephone Encounter (Signed)
   Telephone encounter was:  Unsuccessful.  02/01/2021 Name: Gerald Leach MRN: [id number] DOB: 1934/01/14  Unsuccessful outbound call made today to assist with:  Food Insecurity  Outreach Attempt:  2nd Attempt  A HIPAA compliant voice message was left requesting a return call.  Instructed patient to call back at [phone].  April Green Care Guide, Embedded Care Coordination [place], Care Management Phone: [phone] Email: [email or web address]

## 2021-02-06 ENCOUNTER — Other Ambulatory Visit: Payer: Self-pay | Admitting: Neurology

## 2021-02-06 ENCOUNTER — Telehealth: Payer: Self-pay | Admitting: Pharmacist

## 2021-02-06 ENCOUNTER — Other Ambulatory Visit: Payer: Self-pay | Admitting: Adult Health

## 2021-02-06 DIAGNOSIS — R195 Other fecal abnormalities: Secondary | ICD-10-CM

## 2021-02-06 DIAGNOSIS — K2100 Gastro-esophageal reflux disease with esophagitis, without bleeding: Secondary | ICD-10-CM

## 2021-02-06 NOTE — Progress Notes (Signed)
    Chronic Care Management Pharmacy Assistant   Name: Gerald Leach  MRN: [id number] DOB: 08/07/1933   02-06-21- Patient called to remind of appointment with [name] Clinical Pharmacist on 02-07-21 at 3 pm via phone call.  Call to son [name] he is aware of appointment date, time, and type of appointment and aware to have available all medications, supplements, blood pressure and/or blood sugar logs.    Care Gaps: COVID Vaccines - Overdue Zoster Vaccines - Overdue Eye Exam - Overdue TDAP - Overdue Urine Micro - Overdue Foot Exam - Overdue Flu Vaccine - Overdue  Star Rating Drug: Metformin (Glucophage) 500 mg - [facility]  Any gaps in medications fill history? None   Medications: Outpatient Encounter Medications as of 02/06/2021  Medication Sig   amantadine (SYMMETREL) 100 MG capsule TAKE 1 CAPSULE BY MOUTH EVERY DAY (Patient taking differently: Take 100 mg by mouth daily.)   carbidopa-levodopa (SINEMET IR) 25-100 MG tablet TAKE 1 TABLET BY MOUTH THREE TIMES DAILY   Lacosamide (VIMPAT) 100 MG TABS Take 1 tablet (100 mg total) by mouth 2 (two) times daily.   levETIRAcetam (KEPPRA) 750 MG tablet TAKE 2 TABLETS (1,500 MG TOTAL) BY MOUTH 2 (TWO) TIMES DAILY.   levothyroxine (SYNTHROID) 75 MCG tablet TAKE 1 TABLET BY MOUTH EVERY DAY   metFORMIN (GLUCOPHAGE) 500 MG tablet TAKE 1 TABLET (500 MG TOTAL) BY MOUTH 2 (TWO) TIMES DAILY WITH A MEAL. NEED APPOINTMENT   omeprazole (PRILOSEC) 20 MG capsule Take 1 capsule (20 mg total) by mouth daily.   No facility-administered encounter medications on file as of 02/06/2021.   [place] [facility] Clinical Pharmacist Assistant [phone]

## 2021-02-07 ENCOUNTER — Ambulatory Visit: Payer: Medicare Other | Admitting: Pharmacist

## 2021-02-07 DIAGNOSIS — E119 Type 2 diabetes mellitus without complications: Secondary | ICD-10-CM

## 2021-02-07 DIAGNOSIS — G20 Parkinson's disease: Secondary | ICD-10-CM

## 2021-02-07 NOTE — Progress Notes (Signed)
Chronic Care Management Pharmacy Note  03/15/2021 Name:  Gerald Leach MRN:  [id number] DOB:  1934-04-26  Summary: Pt is struggling with compliance of medications and cost A1c is at goal < 7% TSH above goal 0.35-4.5  Recommendations/Changes made from today's visit: -Recommended applying for medicare extra help and provided website to complete application -Recommend repeat TSH  Plan: Follow up with patient assistance resources for Vimpat  Subjective: Gerald Leach is an 87 y.o. year old male who is a primary patient of [name], NP.  The CCM team was consulted for assistance with disease management and care coordination needs.    Engaged with patient by telephone for initial visit in response to provider referral for pharmacy case management and/or care coordination services.   Consent to Services:  The patient was given the following information about Chronic Care Management services today, agreed to services, and gave verbal consent: 1. CCM service includes personalized support from designated clinical staff supervised by the primary care provider, including individualized plan of care and coordination with other care providers 2. 24/7 contact phone numbers for assistance for urgent and routine care needs. 3. Service will only be billed when office clinical staff spend 20 minutes or more in a month to coordinate care. 4. Only one practitioner may furnish and bill the service in a calendar month. 5.The patient may stop CCM services at any time (effective at the end of the month) by phone call to the office staff. 6. The patient will be responsible for cost sharing (co-pay) of up to 20% of the service fee (after annual deductible is met). Patient agreed to services and consent obtained.  Patient Care Team: [name], NP as PCP - General (Family Medicine) Dorena Cookey, MD (Inactive) Alda Berthold, DO as Consulting Physician (Neurology) Dimitri Ped, RN as Case  Manager Viona Gilmore, [facility] as Pharmacist (Pharmacist)  Recent office visits: 01-24-2021 Dimitri Ped, RN - Patient presented for Initial Outreach Chronic Care Management and coordination needs. No medication changes.   01-24-2021 Saporito, Maree Erie, LCSW - Patient presented for Dysphagia, Diabetes Mellitus Type II, Parkinson's Disease, Dementia Due to Parkinson's Disease Without Behavioral Disturbance, Seizure Disorder, Gait Instability and History of Falls. No medication changes.   01-17-2021 Saporito, Maree Erie, LCSW - Patient presented for Dysphagia, Diabetes Mellitus Type II, Parkinson's Disease, Dementia Due to Parkinson's Disease Without Behavioral Disturbance, Seizure Disorder, Gait Instability and History of Falls. No medication changes.   11-17-2020  [name], NP - Patient presented for Impacted cerumen of right ear and other concerns. Prescribed Omeprazole 20 mg.    08-11-2020 [name], NP - Patient presented for frequent falls. No medication changes.  Recent consult visits: 12-09-2020 Alda Berthold, DO - Patient presented via tele health for Seizures and other concerns. No medication changes.   09-26-2020 [employer] [facility] - Patient presented for NURSING FACIL CARE/DAY NEW PROBLEM 25 MIN no other visit details available.   09-22-2020 [employer] - Patient presented for NURSING FACIL CARE/DAY NEW PROBLEM 25 MIN no other visit details available.   09-13-2020 [employer] [facility] - Patient presented for NURSING FACIL CARE/DAY NEW PROBLEM 25 MIN no other visit details available.   09-09-2020 (08-29-2020 & 08-25-2020)  [employer] [facility] - Patient presented for NURSING FACIL CARE/DAY NEW PROBLEM 25 MIN no other visit details available.   08-19-2020 Verl Blalock NP- Patient presented for PR SBSQ NURS FACIL CARE/DAY UNSTABL/NEW PROB 35 MIN. No other visit  details available.   08-18-2020 Hague, Imran P- Patient presented for [place] 45 MINUTES and HC  TRANSITIONAL CARE MANAGEMENT. NO other visit details available.    Hospital visits: Medication Reconciliation was completed by comparing discharge summary, patient's EMR and Pharmacy list, and upon discussion with patient.   Patient presented to [facility] ED on  08-23-2020 due to Seizure. Patient was there for 17 hours.     New?Medications Started at [facility] Discharge:?? Given extra Vimpat 100   Medication Changes at Hospital Discharge: None   Medications Discontinued at Hospital Discharge: None Medications that remain the same after Hospital Discharge:??  -All other medications will remain the same.       Medication Reconciliation was completed by comparing discharge summary, patient's EMR and Pharmacy list, and upon discussion with patient.   Patient presented to [facility] on 08-11-2020 due to fall and fracture. Patient was there for 5 days.     New?Medications Started at [facility] Discharge:??     Medication Changes at Hospital Discharge: Metformin 500 mg take one 2 times daily with a meal Levothyroxine 75 mcg take one daily Vimpat 100 mg take one 2 times daily   Medications Discontinued at Hospital Discharge: Nystatin powder Omeprazole 20 mg Simvastatin 10 mg   Medications that remain the same after Hospital Discharge:??  -All other medications will remain the same.     Objective:  Lab Results  Component Value Date   CREATININE 0.85 08/23/2020   BUN 18 08/23/2020   GFR 113.23 01/02/2019   GFRNONAA >60 08/23/2020   GFRAA >60 03/07/2018   NA 137 08/23/2020   K 4.1 08/23/2020   CALCIUM 8.7 (L) 08/23/2020   CO2 28 08/23/2020   GLUCOSE 119 (H) 08/23/2020    Lab Results  Component Value Date/Time   HGBA1C 5.6 08/11/2020 11:15 PM   HGBA1C 5.7 01/02/2019 11:26 AM   GFR 113.23 01/02/2019 11:26 AM   GFR 134.37 03/11/2018 05:11 PM   MICROALBUR 0.8 03/25/2014 10:23 AM    MICROALBUR 0.9 03/24/2013 11:33 AM    Last diabetic Eye exam:  Lab Results  Component Value Date/Time   HMDIABEYEEXA normal - [place] opth 04/21/2012 12:00 AM    Last diabetic Foot exam: No results found for: HMDIABFOOTEX   Lab Results  Component Value Date   CHOL 177 01/02/2019   HDL 37.40 (L) 01/02/2019   LDLCALC 106 (H) 01/02/2019   TRIG 169.0 (H) 01/02/2019   CHOLHDL 5 01/02/2019    Hepatic Function Latest Ref Rng & Units 08/12/2020 08/11/2020 01/02/2019  Total Protein 6.5 - 8.1 g/dL 6.6 8.2(H) 6.9  Albumin 3.5 - 5.0 g/dL 3.5 4.1 4.2  AST 15 - 41 U/L '22 24 17  ' ALT 0 - 44 U/L '18 12 23  ' Alk Phosphatase 38 - 126 U/L 70 80 119(H)  Total Bilirubin 0.3 - 1.2 mg/dL 0.9 0.9 0.6  Bilirubin, Direct 0.0 - 0.3 mg/dL - - -    Lab Results  Component Value Date/Time   TSH 11.17 (H) 01/02/2019 11:26 AM   TSH 2.341 03/05/2018 08:51 PM   TSH 4.68 (H) 10/28/2017 11:25 AM   FREET4 0.89 06/20/2016 02:36 PM   FREET4 1.22 02/06/2014 04:29 AM    CBC Latest Ref Rng & Units 08/23/2020 08/12/2020 08/11/2020  WBC 4.0 - 10.5 K/uL 8.3 7.8 8.1  Hemoglobin 13.0 - 17.0 g/dL 15.2 13.9 15.1  Hematocrit 39.0 - 52.0 % 47.1 42.9 46.4  Platelets 150 - 400  K/uL 313 235 165    No results found for: VD25OH  Clinical ASCVD: No  The ASCVD Risk score (Arnett DK, et al., 2019) failed to calculate for the following reasons:   The 2019 ASCVD risk score is only valid for ages 78 to 41    Depression screen PHQ 2/9 01/27/2021 01/17/2021 07/24/2017  Decreased Interest 0 0 0  Down, Depressed, Hopeless 0 0 0  PHQ - 2 Score 0 0 0  Some recent data might be hidden     Social History   Tobacco Use  Smoking Status Never   Passive exposure: Past  Smokeless Tobacco Never   BP Readings from Last 3 Encounters:  11/17/20 120/80  08/24/20 128/83  08/16/20 110/69   Pulse Readings from Last 3 Encounters:  11/17/20 72  08/24/20 83  08/16/20 (!) 59   Wt Readings from Last 3 Encounters:  12/09/20 165 lb (74.8 kg)   08/11/20 165 lb (74.8 kg)  06/06/20 157 lb (71.2 kg)   BMI Readings from Last 3 Encounters:  12/09/20 23.01 kg/m  08/11/20 23.01 kg/m  06/06/20 20.71 kg/m    Assessment/Interventions: Review of patient past medical history, allergies, medications, health status, including review of consultants reports, laboratory and other test data, was performed as part of comprehensive evaluation and provision of chronic care management services.   SDOH:  (Social Determinants of Health) assessments and interventions performed: Yes SDOH Interventions    Flowsheet Row Most Recent Value  SDOH Interventions   Financial Strain Interventions Intervention Not Indicated  Transportation Interventions Intervention Not Indicated      SDOH Screenings   Alcohol Screen: Low Risk    Last Alcohol Screening Score (AUDIT): 0  Depression (PHQ2-9): Low Risk    PHQ-2 Score: 0  Financial Resource Strain: Low Risk    Difficulty of Paying Living Expenses: Not hard at all  Food Insecurity: No Food Insecurity   Worried About Charity fundraiser in the Last Year: Never true   Ran Out of Food in the Last Year: Never true  Housing: Low Risk    Last Housing Risk Score: 0  Physical Activity: Inactive   Days of Exercise per Week: 0 days   Minutes of Exercise per Session: 0 min  Social Connections: Moderately Isolated   Frequency of Communication with Friends and Family: More than three times a week   Frequency of Social Gatherings with Friends and Family: More than three times a week   Attends Religious Services: 1 to 4 times per year   Active Member of [employer] or Organizations: No   Attends Archivist Meetings: Never   Marital Status: Widowed  Stress: No Stress Concern Present   Feeling of Stress : Not at all  Tobacco Use: Low Risk    Smoking Tobacco Use: Never   Smokeless Tobacco Use: Never  Transportation Needs: No Transportation Needs   Lack of Transportation (Medical): No   Lack of  Transportation (Non-Medical): No   Patient lives by himself and his son manages his medications. He has some retirement money but that is his only income.   Patient's son reports he was having a lot of issues with affordability of his medications but is doing better now. The only medications that are more expensive are the Vimpat and Keppra.  Patient doesn't walk very well and doesn't have much of an appetite. He is also limited with drinking and usually drinks coke zero and and apple juice or tea. He hasn't noted any  big changes in weight lately.   CCM Care Plan  No Known Allergies  Medications Reviewed Today     Reviewed by Dimitri Ped, RN (Registered Nurse) on 03/03/21 at 31  Med List Status: <None>   Medication Order Taking? Sig Documenting Provider Last Dose Status Informant  amantadine (SYMMETREL) 100 MG capsule [id number] No TAKE 1 CAPSULE BY MOUTH EVERY DAY  Patient taking differently: Take 100 mg by mouth daily.   Narda Amber K, DO Taking Active   carbidopa-levodopa (SINEMET IR) 25-100 MG tablet [id number] No TAKE 1 TABLET BY MOUTH THREE TIMES A DAY Patel, Donika K, DO Taking Active   Lacosamide (VIMPAT) 100 MG TABS [id number] No Take 1 tablet (100 mg total) by mouth 2 (two) times daily. Narda Amber K, DO Taking Active   levETIRAcetam (KEPPRA) 750 MG tablet [id number] No TAKE 2 TABLETS (1,500 MG TOTAL) BY MOUTH 2 (TWO) TIMES DAILY. Narda Amber K, DO Taking Active Multiple Informants  levothyroxine (SYNTHROID) 75 MCG tablet [id number] No TAKE 1 TABLET BY MOUTH EVERY DAY Nafziger, Tommi Rumps, NP Taking Active   metFORMIN (GLUCOPHAGE) 500 MG tablet [id number] No TAKE 1 TABLET (500 MG TOTAL) BY MOUTH 2 (TWO) TIMES DAILY WITH A MEAL. NEED APPOINTMENT Nafziger, Tommi Rumps, NP Taking Active   omeprazole (PRILOSEC) 20 MG capsule [id number] No TAKE 1 CAPSULE BY MOUTH EVERY DAY Nafziger, Tommi Rumps, NP Taking Active   vitamin B-12 (CYANOCOBALAMIN) 1000 MCG tablet [id number] No Take 1,000 mcg by mouth  daily. [provider] Taking Active             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 ([place]) 08/16/2020   Fall 08/11/2020   Closed compression fracture of L5 lumbar vertebra, initial encounter ([place]) 08/11/2020   Seizures ([place]) 03/05/2018   Leukocytosis 03/05/2018   Diabetic foot ulcers ([place]) 04/23/2017   Parkinson disease ([place])    Gait instability    Diabetes mellitus type 2, controlled ([place]) 08/05/2015   Hypothyroidism 08/05/2015   Fall at home 10/02/2014   CNS aneurysm s/p coiling ([facility], 2002) 10/02/2014   High grade T7 central canal stenosis with T7 vertebral fracture s/p fall (02/2012) 10/02/2014   Right clinoid calcified meningioma vs thrombosed giant ICA aneurysm, conservative management. 10/02/2014   Syncope 02/05/2014   Drug-induced delirium(292.81) 05/16/2013   Seizure disorder ([place]) 04/21/2013   Seizure disorder, grand mal ([place]) 03/24/2013   Cervical compression fracture---C7 10/29/2012   Dysphagia 09/21/2012   Subdural hematoma ([place]) 09/20/2012   SOLAR KERATOSIS 02/17/2009   CARCINOMA, SKIN, SQUAMOUS CELL, FACE 04/19/2008   Hyperlipidemia, group D 01/27/2008   COLONIC POLYPS 11/15/2006   GERD 11/15/2006    Immunization History  Administered Date(s) Administered   Fluad Quad(high Dose 65+) 04/27/2019, 04/17/2020   Influenza Split 06/15/2011, 03/03/2012   Influenza Whole 06/18/2005, 04/23/2007, 03/23/2008, 03/03/2010   Influenza, High Dose Seasonal PF 03/24/2013, 04/18/2015, 05/09/2018   Influenza,inj,Quad PF,6+ Mos 02/16/2014   Pneumococcal Conjugate-13 06/20/2016   Pneumococcal Polysaccharide-23 06/18/2004, 02/08/2009, 04/17/2020   Td 06/19/2003   Zoster, Live 04/10/2010    Conditions to be addressed/monitored:  Diabetes, GERD, Hypothyroidism, and Parkinson's disease and seizures  Care Plan : [place]  Updates made by Viona Gilmore, [place] since  03/15/2021 12:00 AM     Problem: Problem: Diabetes, GERD, Hypothyroidism, and Parkinson's disease and seizures      Long-Range Goal: Patient-Specific Goal   Start Date: 02/07/2021  Expected End Date: 02/07/2021  This Visit's Progress: On track  Priority: High  Note:   Current Barriers:  Unable to independently afford treatment regimen Unable to independently monitor therapeutic efficacy Unable to self administer medications as prescribed  Pharmacist Clinical Goal(s):  Patient will achieve adherence to monitoring guidelines and medication adherence to achieve therapeutic efficacy through collaboration with PharmD and provider.   Interventions: 1:1 collaboration with [name], NP regarding development and update of comprehensive plan of care as evidenced by provider attestation and co-signature Inter-disciplinary care team collaboration (see longitudinal plan of care) Comprehensive medication review performed; medication list updated in electronic medical record  Diabetes (A1c goal <7%) -Controlled -Current medications: Metformin 500 mg 1 tablet twice daily with meals -Medications previously tried: none  -Current home glucose readings fasting glucose: 99-120 post prandial glucose: none -Denies hypoglycemic/hyperglycemic symptoms -Current meal patterns: he doesn't have much of an appetite overall breakfast: cereal or bar  lunch: n/a  dinner: meat and 2 vegetables snacks: none drinks: did not discuss -Current exercise: none -Educated on Exercise goal of 150 minutes per week; Benefits of routine self-monitoring of blood sugar; Carbohydrate counting and/or plate method -Counseled to check feet daily and get yearly eye exams -Counseled on diet and exercise extensively Recommended to continue current medication  GERD (Goal: minimize symptoms) -Controlled -Current treatment  Omeprazole 20 mg 1 capsule daily -Medications previously tried: none  -Recommended to continue  current medication  Hypothyroidism (Goal: TSH 0.34-4.5) -Uncontrolled -Current treatment  Levothyroxine 75 mcg 1 tablet daily -Medications previously tried: none  -Counseled on importance of taking this on an empty stomach separate from other medications and with water. Recommended repeat TSH.  Seizures (Goal: prevent seizures) -Controlled -Current treatment  Vimpat 100 mg 1 tablet twice daily Levetiracetam 750 mg 2 tablets twice daily -Medications previously tried: none  -Assessed patient finances. Patient may qualify for Vimpat patient assistance.  Parkinson's disease (Goal: minimize symptoms and slow disease progression) -Not ideally controlled -Current treatment  Amantadine 100 mg 1 capsule daily Carbidopa-levodopa IR 25-100 mg 1 tablet three times daily -Medications previously tried: none  -Educated on differences in medications for treatment.   Health Maintenance -Vaccine gaps: COVID, shingrix, tetanus, influenza -Current therapy:  Vitamin B12 1000 mcg daily -Educated on Cost vs benefit of each product must be carefully weighed by individual consumer -Patient is satisfied with current therapy and denies issues -Recommended to continue current medication  Patient Goals/Self-Care Activities Patient will:  - take medications as prescribed focus on medication adherence by using a pillbox and considering the use of a timer to help with compliance.  Follow Up Plan: The care management team will reach out to the patient again over the next 30 days.        Medication Assistance:  Working on [employer] extra help application  Compliance/Adherence/Medication fill history: Care Gaps: COVID vaccine, shingrix, eye exam, tetanus, urine microalbumin, foot exam, influenza  Star-Rating Drugs: Metformin (Glucophage) 500 mg - [facility]15-[facility]  Patient's [facility]:  [id number] [place] [id number]: [phone]: [phone] [id number] [address]: [phone]: [phone] pill box? Yes - using a timer pill box Pt endorses 80% compliance  We discussed: Current pharmacy is preferred with insurance plan and patient is satisfied with pharmacy services Patient decided to: Continue current medication management strategy  Care Plan and  Follow Up Patient Decision:  Patient agrees to Care Plan and Follow-up.  Plan: The care management team will reach out to the patient again over the next 30 days.  [name], PharmD, [place] Pharmacist [place] at [place]

## 2021-02-08 ENCOUNTER — Ambulatory Visit: Payer: Medicare Other | Admitting: *Deleted

## 2021-02-08 DIAGNOSIS — R296 Repeated falls: Secondary | ICD-10-CM

## 2021-02-08 DIAGNOSIS — R2681 Unsteadiness on feet: Secondary | ICD-10-CM

## 2021-02-08 DIAGNOSIS — G40909 Epilepsy, unspecified, not intractable, without status epilepticus: Secondary | ICD-10-CM

## 2021-02-08 DIAGNOSIS — F0280 Dementia in other diseases classified elsewhere without behavioral disturbance: Secondary | ICD-10-CM

## 2021-02-08 DIAGNOSIS — G20A1 Parkinson'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 dyskinesia, without mention of fluctuations: Secondary | ICD-10-CM

## 2021-02-08 DIAGNOSIS — G20 Parkinson's disease: Secondary | ICD-10-CM

## 2021-02-08 DIAGNOSIS — R4189 Other symptoms and signs involving cognitive functions and awareness: Secondary | ICD-10-CM

## 2021-02-08 DIAGNOSIS — G40409 Other generalized epilepsy and epileptic syndromes, not intractable, without status epilepticus: Secondary | ICD-10-CM

## 2021-02-08 DIAGNOSIS — W19XXXD Unspecified fall, subsequent encounter: Secondary | ICD-10-CM

## 2021-02-08 NOTE — Patient Instructions (Signed)
Visit Information  PATIENT GOALS:  Goals Addressed             This Visit's Progress    Find Help in My Community.   On track    Timeframe:  Short-Term Goal Priority:  High Start Date:   01/17/2021                          Expected End Date:   03/20/2021                    Follow-Up Date:  02/16/2021 at 10:00am  Patient Goals/Self-Care Activities: Encouraged daughter to submit completed Personal Care Service application to [employer] for processing, as well as Media planner to the [place] for processing.   Reinforced importance of applying for Adult Medicaid through the [place].    Camera operator of [employer] ([place] Department) to periodically check the status of your application, submitted on 01/17/2021. Work with [employer] on a weekly/bi-weekly basis to try and obtain [place], In-Home [employer], and Adult [place]. Continue to receive private agency sitter services from 1:00 pm - 5:00 pm, Monday through Friday.        Patient verbalizes understanding of instructions provided today and agrees to view in [place].   Telephone follow-up appointment with care management team member scheduled for:  02/16/2021 at 10:00am  [place] Licensed Clinical Social Worker [place] [phone]

## 2021-02-08 NOTE — Chronic Care Management (AMB) (Signed)
Chronic Care Management    Clinical Social Work Note  02/08/2021 Name: Gerald Leach MRN: [id number] DOB: December 06, 1933  Gerald Leach is a 87 y.o. year old male who is a primary care patient of [name], NP. The CCM team was consulted to assist the patient with chronic disease management and/or care coordination needs related to: Level of Care Concerns.   Engaged with patient's daughter by telephone for follow-up visit in response to provider referral for social work chronic care management and care coordination services.   Consent to Services:  The patient was given information about Chronic Care Management services, agreed to services, and gave verbal consent prior to initiation of services.  Please see initial visit note for detailed documentation.   Patient agreed to services and consent obtained.   Assessment: Review of patient past medical history, allergies, medications, and health status, including review of relevant consultants reports was performed today as part of a comprehensive evaluation and provision of chronic care management and care coordination services.     SDOH (Social Determinants of Health) assessments and interventions performed:    Advanced Directives Status: Not addressed in this encounter.  CCM Care Plan  No Known Allergies  Outpatient Encounter Medications as of 02/08/2021  Medication Sig   amantadine (SYMMETREL) 100 MG capsule TAKE 1 CAPSULE BY MOUTH EVERY DAY (Patient taking differently: Take 100 mg by mouth daily.)   carbidopa-levodopa (SINEMET IR) 25-100 MG tablet TAKE 1 TABLET BY MOUTH THREE TIMES A DAY   Lacosamide (VIMPAT) 100 MG TABS Take 1 tablet (100 mg total) by mouth 2 (two) times daily.   levETIRAcetam (KEPPRA) 750 MG tablet TAKE 2 TABLETS (1,500 MG TOTAL) BY MOUTH 2 (TWO) TIMES DAILY.   levothyroxine (SYNTHROID) 75 MCG tablet TAKE 1 TABLET BY MOUTH EVERY DAY   metFORMIN (GLUCOPHAGE) 500 MG tablet TAKE 1 TABLET (500 MG TOTAL) BY MOUTH 2  (TWO) TIMES DAILY WITH A MEAL. NEED APPOINTMENT   omeprazole (PRILOSEC) 20 MG capsule TAKE 1 CAPSULE BY MOUTH EVERY DAY   vitamin B-12 (CYANOCOBALAMIN) 1000 MCG tablet Take 1,000 mcg by mouth daily.   No facility-administered encounter medications on file as of 02/08/2021.    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 ([place]) 08/16/2020   Fall 08/11/2020   Closed compression fracture of L5 lumbar vertebra, initial encounter ([place]) 08/11/2020   Seizures ([place]) 03/05/2018   Leukocytosis 03/05/2018   Diabetic foot ulcers ([place]) 04/23/2017   Parkinson disease ([place])    Gait instability    Diabetes mellitus type 2, controlled ([place]) 08/05/2015   Hypothyroidism 08/05/2015   Fall at home 10/02/2014   CNS aneurysm s/p coiling ([facility], 2002) 10/02/2014   High grade T7 central canal stenosis with T7 vertebral fracture s/p fall (02/2012) 10/02/2014   Right clinoid calcified meningioma vs thrombosed giant ICA aneurysm, conservative management. 10/02/2014   Syncope 02/05/2014   Drug-induced delirium(292.81) 05/16/2013   Seizure disorder ([place]) 04/21/2013   Seizure disorder, grand mal ([place]) 03/24/2013   Cervical compression fracture---C7 10/29/2012   Dysphagia 09/21/2012   Subdural hematoma ([place]) 09/20/2012   SOLAR KERATOSIS 02/17/2009   CARCINOMA, SKIN, SQUAMOUS CELL, FACE 04/19/2008   Hyperlipidemia, group D 01/27/2008   COLONIC POLYPS 11/15/2006   GERD 11/15/2006    Conditions to be addressed/monitored: Dementia.  Level of Care Concerns, ADL/IADL Limitations, Limited Access to Caregiver, Cognitive Deficits, Memory Deficits, and Lacks Knowledge of [employer].  Care Plan : LCSW  Plan of Care.  Updates made by Francis Gaines, LCSW since 02/08/2021 12:00 AM     Problem: Find Help in My Community.   Priority: High     Goal: Find Help in My Community.   Start Date: 01/17/2021  Expected End Date: 03/20/2021  This  Visit's Progress: On track  Recent Progress: On track  Priority: High  Note:   Current Barriers:   Patient with Dysphagia, Diabetes Mellitus Type II, Parkinson's Disease, Dementia Due to Parkinson's Disease Without Behavioral Disturbance, Seizure Disorder, Gait Instability and History of Falls. Patient is unable to self-administer medications as prescribed or consistently perform ADL's/IADL's independently. Lacks knowledge of available community agencies and resources. Clinical Goals:  Patient will have increased hours of in-home care services in place and will receive prepared meal delivery services through [place] with [employer] of [place].    Patient and daughter will work with [employer], [employer], Forensic psychologist, Department of Orthoptist and [employer] of [place] to coordinate care for patient.   Patient will demonstrate improved health management independence as evidenced by having in-home care services in place. Clinical Interventions: Patient and daughter interviewed and appropriate assessments performed. Collaboration with Nurse Practitioner, [name] regarding development and update of comprehensive plan of care as evidenced by provider attestation and co-signature. Inter-disciplinary care team collaboration (see longitudinal plan of care). Interventions performed:  Problem Solving/Task Centered, Psychoeducation/Health Education, Quality of Sleep Assessed and Sleep Hygiene Techniques Promoted, Caregiver Stress Acknowledged and Consideration of In-Home Care Services Encouraged. Discussed plans with patient and daughter for ongoing care management follow-up and provided direct contact information for care management team. Assisted patient and daughter with obtaining information about health plan benefits through [employer]. Provided education to patient and daughter regarding level of care  options. Assessed needs, level of care concerns, basic eligibility and provided education on Personal Care Service process, In-Home Aide Service process, Adult Medicaid process and Meal-on-Wheels referral process.   Patient Goals/Self-Care Activities:  Encouraged daughter to submit completed Personal Care Service application to [employer] for processing, as well as Media planner to the [place] for processing.   Reinforced importance of applying for Adult Medicaid through the [place].    Camera operator of [employer] ([place] Department) to periodically check the status of your application, submitted on 01/17/2021. Work with [employer] on a weekly/bi-weekly basis to try and obtain [place], In-Home [employer], and Adult [place]. Continue to receive private agency sitter services from 1:00 pm - 5:00 pm, Monday through Friday. Follow-Up:  02/16/2021 at 10:00am      Follow-Up Plan:  02/16/2021 at 10:00am      [place]enn [place] Licensed Clinical Social Worker [place] [phone]

## 2021-02-15 DIAGNOSIS — F0280 Dementia in other diseases classified elsewhere without behavioral disturbance: Secondary | ICD-10-CM | POA: Diagnosis not present

## 2021-02-15 DIAGNOSIS — G20 Parkinson's disease: Secondary | ICD-10-CM | POA: Diagnosis not present

## 2021-02-15 DIAGNOSIS — E119 Type 2 diabetes mellitus without complications: Secondary | ICD-10-CM

## 2021-02-16 ENCOUNTER — Telehealth: Payer: Medicare Other

## 2021-02-16 ENCOUNTER — Ambulatory Visit (INDEPENDENT_AMBULATORY_CARE_PROVIDER_SITE_OTHER): Payer: Medicare Other | Admitting: *Deleted

## 2021-02-16 DIAGNOSIS — F0280 Dementia in other diseases classified elsewhere without behavioral disturbance: Secondary | ICD-10-CM

## 2021-02-16 DIAGNOSIS — E119 Type 2 diabetes mellitus without complications: Secondary | ICD-10-CM

## 2021-02-16 DIAGNOSIS — W19XXXD Unspecified fall, subsequent encounter: Secondary | ICD-10-CM

## 2021-02-16 DIAGNOSIS — R296 Repeated falls: Secondary | ICD-10-CM

## 2021-02-16 DIAGNOSIS — G20 Parkinson's disease: Secondary | ICD-10-CM

## 2021-02-16 DIAGNOSIS — R2681 Unsteadiness on feet: Secondary | ICD-10-CM

## 2021-02-16 DIAGNOSIS — G40409 Other generalized epilepsy and epileptic syndromes, not intractable, without status epilepticus: Secondary | ICD-10-CM

## 2021-02-16 DIAGNOSIS — R4189 Other symptoms and signs involving cognitive functions and awareness: Secondary | ICD-10-CM

## 2021-02-16 DIAGNOSIS — G40909 Epilepsy, unspecified, not intractable, without status epilepticus: Secondary | ICD-10-CM

## 2021-02-16 DIAGNOSIS — G20A1 Parkinson'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 dyskinesia, without mention of fluctuations: Secondary | ICD-10-CM

## 2021-02-16 NOTE — Patient Instructions (Signed)
Visit Information  PATIENT GOALS:  Goals Addressed             This Visit's Progress    Find Help in My Community.   On track    Timeframe:  Short-Term Goal Priority:  High Start Date:   01/17/2021                          Expected End Date:  03/20/2021                    Follow-Up Date:  02/24/2021 at 9:00am  Patient Goals/Self-Care Activities: Encouraged daughter to submit completed Personal Care Service application to [employer] for processing, as well as Media planner to the [place] for processing.   Reinforced importance of applying for Adult Medicaid through the [place], notifying LCSW directly if assistance is needed with application completion and/or submission.    Encouraged daughter to periodically contact Senior Resources of [facility] ([place]) to check the status of your application, providing contact information. Continue to work with LCSW on a weekly/bi-weekly basis to obtain [place] or [employer], as well as Adult Medicaid. Continue to receive private agency sitter services from 1:00 pm - 5:00 pm, Monday through Friday.        Patient verbalizes understanding of instructions provided today and agrees to view in [place].   Telephone follow-up appointment with care management team member scheduled for:  02/24/2021 at [place] LCSW Licensed Clinical Social Worker [place] [phone]

## 2021-02-16 NOTE — Chronic Care Management (AMB) (Signed)
Chronic Care Management    Clinical Social Work Note  02/16/2021 Name: Gerald Leach MRN: [id number] DOB: 1934/02/27  Gerald Leach is a 87 y.o. year old male who is a primary care patient of [name], NP. The CCM team was consulted to assist the patient with chronic disease management and/or care coordination needs related to: Level of Care Concerns.   Engaged with patient's daughter by telephone for follow-up visit in response to provider referral for social work chronic care management and care coordination services.   Consent to Services:  The patient was given information about Chronic Care Management services, agreed to services, and gave verbal consent prior to initiation of services.  Please see initial visit note for detailed documentation.   Patient agreed to services and consent obtained.   Assessment: Review of patient past medical history, allergies, medications, and health status, including review of relevant consultants reports was performed today as part of a comprehensive evaluation and provision of chronic care management and care coordination services.     SDOH (Social Determinants of Health) assessments and interventions performed:    Advanced Directives Status: Not addressed in this encounter.  CCM Care Plan  No Known Allergies  Outpatient Encounter Medications as of 02/16/2021  Medication Sig   amantadine (SYMMETREL) 100 MG capsule TAKE 1 CAPSULE BY MOUTH EVERY DAY (Patient taking differently: Take 100 mg by mouth daily.)   carbidopa-levodopa (SINEMET IR) 25-100 MG tablet TAKE 1 TABLET BY MOUTH THREE TIMES A DAY   Lacosamide (VIMPAT) 100 MG TABS Take 1 tablet (100 mg total) by mouth 2 (two) times daily.   levETIRAcetam (KEPPRA) 750 MG tablet TAKE 2 TABLETS (1,500 MG TOTAL) BY MOUTH 2 (TWO) TIMES DAILY.   levothyroxine (SYNTHROID) 75 MCG tablet TAKE 1 TABLET BY MOUTH EVERY DAY   metFORMIN (GLUCOPHAGE) 500 MG tablet TAKE 1 TABLET (500 MG TOTAL) BY MOUTH 2 (TWO)  TIMES DAILY WITH A MEAL. NEED APPOINTMENT   omeprazole (PRILOSEC) 20 MG capsule TAKE 1 CAPSULE BY MOUTH EVERY DAY   vitamin B-12 (CYANOCOBALAMIN) 1000 MCG tablet Take 1,000 mcg by mouth daily.   No facility-administered encounter medications on file as of 02/16/2021.    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 ([place]) 08/16/2020   Fall 08/11/2020   Closed compression fracture of L5 lumbar vertebra, initial encounter ([place]) 08/11/2020   Seizures ([place]) 03/05/2018   Leukocytosis 03/05/2018   Diabetic foot ulcers ([place]) 04/23/2017   Parkinson disease ([place])    Gait instability    Diabetes mellitus type 2, controlled ([place]) 08/05/2015   Hypothyroidism 08/05/2015   Fall at home 10/02/2014   CNS aneurysm s/p coiling ([facility], 2002) 10/02/2014   High grade T7 central canal stenosis with T7 vertebral fracture s/p fall (02/2012) 10/02/2014   Right clinoid calcified meningioma vs thrombosed giant ICA aneurysm, conservative management. 10/02/2014   Syncope 02/05/2014   Drug-induced delirium(292.81) 05/16/2013   Seizure disorder ([place]) 04/21/2013   Seizure disorder, grand mal ([place]) 03/24/2013   Cervical compression fracture---C7 10/29/2012   Dysphagia 09/21/2012   Subdural hematoma ([place]) 09/20/2012   SOLAR KERATOSIS 02/17/2009   CARCINOMA, SKIN, SQUAMOUS CELL, FACE 04/19/2008   Hyperlipidemia, group D 01/27/2008   COLONIC POLYPS 11/15/2006   GERD 11/15/2006    Conditions to be addressed/monitored: Dementia.  Limited Social Support, Level of Care Concerns, ADL/IADL Limitations, Social Isolation, Limited Access to Caregiver, Cognitive Deficits, Memory Deficits, and Lacks Knowledge of [employer].  Care Plan : LCSW Plan of Care.  Updates made by Francis Gaines, LCSW since 02/16/2021 12:00 AM     Problem: Find Help in My Community.   Priority: High     Goal: Find Help in My Community.   Start Date: 01/17/2021   Expected End Date: 03/20/2021  This Visit's Progress: On track  Recent Progress: On track  Priority: High  Note:   Current Barriers:   Patient with Dysphagia, Diabetes Mellitus Type II, Parkinson's Disease, Dementia Due to Parkinson's Disease Without Behavioral Disturbance, Seizure Disorder, Gait Instability and History of Falls. Patient is unable to self-administer medications as prescribed or consistently perform ADL's/IADL's independently. Lacks knowledge of available community agencies and resources. Clinical Goals:  Patient will have increased hours of in-home care services in place and will receive prepared meal delivery services through [place] with [employer] of [place].    Patient and daughter will work with [employer], [employer], Forensic psychologist, Department of Orthoptist and [employer] of [place] to coordinate care for patient.   Patient will demonstrate improved health management independence as evidenced by having in-home care services in place. Clinical Interventions: Collaboration with Nurse Practitioner, [name] regarding development and update of comprehensive plan of care as evidenced by provider attestation and co-signature. Inter-disciplinary care team collaboration (see longitudinal plan of care). Interventions performed:  Problem Solving/Task Centered, Psychoeducation/Health Education, Quality of Sleep Assessed and Sleep Hygiene Techniques Promoted, Caregiver Stress Acknowledged and Consideration of In-Home Care Services Encouraged. Discussed plans with patient and daughter for ongoing care management follow-up and provided direct contact information for care management team. Assessed needs, level of care concerns, basic eligibility and provided education on Personal Care Service process, In-Home Aide Service process, Adult Medicaid process and Meal-on-Wheels referral process.   Patient Goals/Self-Care  Activities:  Encouraged daughter to submit completed Personal Care Service application to [employer] for processing, as well as Media planner to the [place] for processing.   Reinforced importance of applying for Adult Medicaid through the [place], notifying LCSW directly if assistance is needed with application completion and/or submission.    Encouraged daughter to periodically contact Senior Resources of [facility] ([place]) to check the status of your application, providing contact information. Continue to work with LCSW on a weekly/bi-weekly basis to obtain [place] or [employer], as well as Adult Medicaid. Continue to receive private agency sitter services from 1:00 pm - 5:00 pm, Monday through Friday. Follow-Up:  02/24/2021 at [place]:  02/24/2021 at [place] Social Worker [place] [phone]

## 2021-02-23 ENCOUNTER — Telehealth: Payer: Self-pay | Admitting: Adult Health

## 2021-02-23 NOTE — Telephone Encounter (Signed)
   Telephone encounter was:  Unsuccessful.  02/23/2021 Name: Gerald Leach MRN: [id number] DOB: 06-24-1933  Unsuccessful outbound call made today to assist with:  Food Insecurity  Outreach Attempt:  3rd Attempt.  Referral closed unable to contact patient.  A HIPAA compliant voice message was left requesting a return call.  Instructed patient to call back at [phone].  April Green Care Guide, Embedded Care Coordination [place], Care Management Phone: [phone] Email: [email or web address]

## 2021-02-23 NOTE — Telephone Encounter (Signed)
   Telephone encounter was:  Successful.  02/23/2021 Name: Gerald Leach MRN: [id number] DOB: 11/24/1933  Gerald Leach is a 87 y.o. year old male who is a primary care patient of [name], NP . The community resource team was consulted for assistance with [place] guide performed the following interventions: Patient provided with information about care guide support team and interviewed to confirm resource needs  Left 3 voice mail messages for daughter [name] and patients son called me finally back and left a voice mail. I called him back and explained the MOW program for [facility]. was on hold and offered to send a list of local food banks. He declined and said they have someone that comes in during the week and takes care of their dad from 1-5. I advised if the needs change to reach back out.   Follow Up Plan:  No further follow up planned at this time. The patient has been provided with needed resources.  April Green Care Guide, Embedded Care Coordination [place], Care Management Phone: [phone] Email: [email or web address]

## 2021-02-24 ENCOUNTER — Ambulatory Visit: Payer: Medicare Other | Admitting: *Deleted

## 2021-02-24 DIAGNOSIS — I739 Peripheral vascular disease, unspecified: Secondary | ICD-10-CM | POA: Diagnosis not present

## 2021-02-24 DIAGNOSIS — W19XXXD Unspecified fall, subsequent encounter: Secondary | ICD-10-CM

## 2021-02-24 DIAGNOSIS — M2041 Other hammer toe(s) (acquired), right foot: Secondary | ICD-10-CM | POA: Diagnosis not present

## 2021-02-24 DIAGNOSIS — G20A1 Parkinson'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 dyskinesia, without mention of fluctuations: Secondary | ICD-10-CM

## 2021-02-24 DIAGNOSIS — E1151 Type 2 diabetes mellitus with diabetic peripheral angiopathy without gangrene: Secondary | ICD-10-CM | POA: Diagnosis not present

## 2021-02-24 DIAGNOSIS — G40909 Epilepsy, unspecified, not intractable, without status epilepticus: Secondary | ICD-10-CM

## 2021-02-24 DIAGNOSIS — L97512 Non-pressure chronic ulcer of other part of right foot with fat layer exposed: Secondary | ICD-10-CM | POA: Diagnosis not present

## 2021-02-24 DIAGNOSIS — E783 Hyperchylomicronemia: Secondary | ICD-10-CM

## 2021-02-24 DIAGNOSIS — R296 Repeated falls: Secondary | ICD-10-CM

## 2021-02-24 DIAGNOSIS — R4189 Other symptoms and signs involving cognitive functions and awareness: Secondary | ICD-10-CM

## 2021-02-24 DIAGNOSIS — M2042 Other hammer toe(s) (acquired), left foot: Secondary | ICD-10-CM | POA: Diagnosis not present

## 2021-02-24 DIAGNOSIS — R2681 Unsteadiness on feet: Secondary | ICD-10-CM

## 2021-02-24 DIAGNOSIS — M79671 Pain in right foot: Secondary | ICD-10-CM | POA: Diagnosis not present

## 2021-02-24 DIAGNOSIS — G20 Parkinson's disease: Secondary | ICD-10-CM

## 2021-02-24 DIAGNOSIS — F0280 Dementia in other diseases classified elsewhere without behavioral disturbance: Secondary | ICD-10-CM

## 2021-02-24 NOTE — Chronic Care Management (AMB) (Signed)
Chronic Care Management    Clinical Social Work Note  02/24/2021 Name: Gerald Leach MRN: [id number] DOB: 1933-11-13  Gerald Leach is a 87 y.o. year old male who is a primary care patient of [name], NP. The CCM team was consulted to assist the patient with chronic disease management and/or care coordination needs related to: Level of Care Concerns and Caregiver Stress.   Engaged with patient's daughter by telephone for follow-up visit in response to provider referral for social work chronic care management and care coordination services.   Consent to Services:  The patient was given information about Chronic Care Management services, agreed to services, and gave verbal consent prior to initiation of services.  Please see initial visit note for detailed documentation.   Patient agreed to services and consent obtained.   Assessment: Review of patient past medical history, allergies, medications, and health status, including review of relevant consultants reports was performed today as part of a comprehensive evaluation and provision of chronic care management and care coordination services.     SDOH (Social Determinants of Health) assessments and interventions performed:    Advanced Directives Status: Not addressed in this encounter.  CCM Care Plan  No Known Allergies  Outpatient Encounter Medications as of 02/24/2021  Medication Sig   amantadine (SYMMETREL) 100 MG capsule TAKE 1 CAPSULE BY MOUTH EVERY DAY (Patient taking differently: Take 100 mg by mouth daily.)   carbidopa-levodopa (SINEMET IR) 25-100 MG tablet TAKE 1 TABLET BY MOUTH THREE TIMES A DAY   Lacosamide (VIMPAT) 100 MG TABS Take 1 tablet (100 mg total) by mouth 2 (two) times daily.   levETIRAcetam (KEPPRA) 750 MG tablet TAKE 2 TABLETS (1,500 MG TOTAL) BY MOUTH 2 (TWO) TIMES DAILY.   levothyroxine (SYNTHROID) 75 MCG tablet TAKE 1 TABLET BY MOUTH EVERY DAY   metFORMIN (GLUCOPHAGE) 500 MG tablet TAKE 1 TABLET (500 MG  TOTAL) BY MOUTH 2 (TWO) TIMES DAILY WITH A MEAL. NEED APPOINTMENT   omeprazole (PRILOSEC) 20 MG capsule TAKE 1 CAPSULE BY MOUTH EVERY DAY   vitamin B-12 (CYANOCOBALAMIN) 1000 MCG tablet Take 1,000 mcg by mouth daily.   No facility-administered encounter medications on file as of 02/24/2021.    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 ([place]) 08/16/2020   Fall 08/11/2020   Closed compression fracture of L5 lumbar vertebra, initial encounter ([place]) 08/11/2020   Seizures ([place]) 03/05/2018   Leukocytosis 03/05/2018   Diabetic foot ulcers ([place]) 04/23/2017   Parkinson disease ([place])    Gait instability    Diabetes mellitus type 2, controlled ([place]) 08/05/2015   Hypothyroidism 08/05/2015   Fall at home 10/02/2014   CNS aneurysm s/p coiling ([facility], 2002) 10/02/2014   High grade T7 central canal stenosis with T7 vertebral fracture s/p fall (02/2012) 10/02/2014   Right clinoid calcified meningioma vs thrombosed giant ICA aneurysm, conservative management. 10/02/2014   Syncope 02/05/2014   Drug-induced delirium(292.81) 05/16/2013   Seizure disorder ([place]) 04/21/2013   Seizure disorder, grand mal ([place]) 03/24/2013   Cervical compression fracture---C7 10/29/2012   Dysphagia 09/21/2012   Subdural hematoma ([place]) 09/20/2012   SOLAR KERATOSIS 02/17/2009   CARCINOMA, SKIN, SQUAMOUS CELL, FACE 04/19/2008   Hyperlipidemia, group D 01/27/2008   COLONIC POLYPS 11/15/2006   GERD 11/15/2006    Conditions to be addressed/monitored: Dementia.  Level of Care Concerns, ADL/IADL Limitations, Limited Access to Caregiver, Memory Deficits and Cognitive Deficits.  Care Plan : LCSW Plan of  Care.  Updates made by Francis Gaines, LCSW since 02/24/2021 12:00 AM     Problem: Find Help in My Community. Resolved 02/24/2021  Priority: High     Goal: Find Help in My Community. Completed 02/24/2021  Start Date: 01/17/2021  Expected End Date:  02/24/2021  This Visit's Progress: On track  Recent Progress: On track  Priority: High  Note:   Current Barriers:   Patient with Dysphagia, Diabetes Mellitus Type II, Parkinson's Disease, Dementia Due to Parkinson's Disease Without Behavioral Disturbance, Seizure Disorder, Gait Instability and History of Falls. Patient is unable to self-administer medications as prescribed or consistently perform ADL's/IADL's independently. Clinical Goals:  Patient will have increased hours of in-home care services, as well as [place] in place, and will receive prepared meal delivery services through [employer] of [facility], [employer].    Daughter will work with [employer] in order to obtain [employer], the [place] in order to obtain Adult Medicaid, and [employer] of [place] to [employer].   Patient will demonstrate improved health management independence as evidenced by having private agency sitter services in place, as well as [place] through [employer].   Clinical Interventions: Collaboration with Nurse Practitioner, [name] regarding development and update of comprehensive plan of care as evidenced by provider attestation and co-signature. Inter-disciplinary care team collaboration (see longitudinal plan of care). Interventions performed:  Problem Solving/Task Centered, Psychoeducation/Health Education, Quality of Sleep Assessed and Sleep Hygiene Techniques Promoted, Caregiver Stress Acknowledged and Consideration of In-Home Care Services Encouraged. Patient Goals/Self-Care Activities:  Daughter has agreed to accept all calls from [employer] (# [phone]) in order for them to schedule an initial home assessment to determine eligibility for [place].   Reinforced to daughter the importance of applying  for Adult Medicaid, through the [place] (# [phone]), notifying LCSW directly if she needs assistance with application completion and/or submission.    Daughter has agreed to periodically follow-up with [employer] of [place] [phone]) to check the status of your application for Meals-on-Wheels. Continue to receive private agency sitter services from 1:00 pm - 5:00 pm, Monday through Friday, to assist with supervision, ADL's and IADL's. Contact LCSW directly (# [phone]) if you have questions, need assistance, or if additional social work needs are identified within the near future. Follow-Up:  No Follow-Up Required, Per Daughter      Follow-Up Plan:  No Follow-Up Required, Per Daughter      Nat Christen LCSW Licensed Clinical Social Worker [place] [phone]

## 2021-02-24 NOTE — Patient Instructions (Signed)
Visit Information  PATIENT GOALS:  Goals Addressed             This Visit's Progress    COMPLETED: Find Help in My Community.   On track    Timeframe:  Short-Term Goal Priority:  High Start Date:   01/17/2021                          Expected End Date:  02/24/2021                  Follow-Up Date:  No Follow-Up Required, Per Daughter  Patient Goals/Self-Care Activities: Daughter has agreed to accept all calls from [employer] (# [phone]) in order for them to schedule an initial home assessment to determine eligibility for [place].   Reinforced to daughter the importance of applying for Adult Medicaid, through the [place] (# [phone]), notifying LCSW directly if she needs assistance with application completion and/or submission.    Daughter has agreed to periodically follow-up with [employer] [place] [phone]) to check the status of your application for [employer]-on-Wheels. Continue to receive private agency sitter services from 1:00 pm - 5:00 pm, Monday through Friday, to assist with supervision, ADL's and IADL's. Contact LCSW directly (# [phone]) if you have questions, need assistance, or if additional social work needs are identified within the near future.        Patient verbalizes understanding of instructions provided today and agrees to view in [place].   No Follow-Up Required, Per Daughter.  Nat Christen LCSW Licensed Clinical Social Worker [place] [phone]

## 2021-03-03 ENCOUNTER — Ambulatory Visit: Payer: Medicare Other

## 2021-03-03 DIAGNOSIS — F0280 Dementia in other diseases classified elsewhere without behavioral disturbance: Secondary | ICD-10-CM

## 2021-03-03 DIAGNOSIS — R296 Repeated falls: Secondary | ICD-10-CM

## 2021-03-03 DIAGNOSIS — G40909 Epilepsy, unspecified, not intractable, without status epilepticus: Secondary | ICD-10-CM

## 2021-03-03 DIAGNOSIS — E119 Type 2 diabetes mellitus without complications: Secondary | ICD-10-CM

## 2021-03-03 DIAGNOSIS — G20 Parkinson's disease: Secondary | ICD-10-CM

## 2021-03-03 NOTE — Patient Instructions (Signed)
Visit Information  PATIENT GOALS:  Goals Addressed             This Visit's Progress    RNCM:Eat Healthy   On track    Timeframe:  Long-Range Goal Priority:  Medium Start Date:     01/27/21                        Expected End Date:     06/18/21                  Follow Up Date  04/07/21    - change to whole grain breads, cereal, pasta - drink 6 to 8 glasses of water each day - fill half of plate with vegetables - read food labels for fat, fiber, carbohydrates and portion size - switch to sugar-free drinks -drink diabetic nutritional supplement daily as needed    Why is this important?   When you are ready to manage your nutrition or weight, having a plan and setting goals will help.  Taking small steps to change how you eat and exercise is a good place to start.    Notes:      RNCM:Prevent Falls-Dementia   On track    Timeframe:  Long-Range Goal Priority:  High Start Date:         01/27/21                    Expected End Date:   06/18/21                   Follow Up Date 04/07/21    - always use handrails on the stairs - install bathroom grab bars - keep a flashlight by the bed - keep cell phone with me always - make an emergency alert plan in case I fall - pick up clutter from the floors - remove throw rugs or use nonslip pads - use a cane or walker - use nightlight in the bathroom, halls - always wear low-heeled or flat shoes or slippers with nonskid soles    Why is this important?   There may be trouble with balance and getting around. Falls can happen.    Notes:         The patient verbalized understanding of instructions, educational materials, and care plan provided today and declined offer to receive copy of patient instructions, educational materials, and care plan.   Telephone follow up appointment with care management team member scheduled for: 04/07/21 at [place] RN, [facility], CDE Care Management Coordinator [facility] [phone], Mobile [phone]

## 2021-03-03 NOTE — Chronic Care Management (AMB) (Signed)
Chronic Care Management   CCM RN Visit Note  03/03/2021 Name: Gerald Leach MRN: [id number] DOB: 1934/04/03  Subjective: Gerald Leach is a 87 y.o. year old male who is a primary care patient of [name], NP. The care management team was consulted for assistance with disease management and care coordination needs.    Engaged with patient by telephone for follow up visit in response to provider referral for case management and/or care coordination services.   Consent to Services:  The patient was given information about Chronic Care Management services, agreed to services, and gave verbal consent prior to initiation of services.  Please see initial visit note for detailed documentation.   Patient agreed to services and verbal consent obtained.   Assessment: Review of patient past medical history, allergies, medications, health status, including review of consultants reports, laboratory and other test data, was performed as part of comprehensive evaluation and provision of chronic care management services.   SDOH (Social Determinants of Health) assessments and interventions performed:    CCM Care Plan  No Known Allergies  Outpatient Encounter Medications as of 03/03/2021  Medication Sig   amantadine (SYMMETREL) 100 MG capsule TAKE 1 CAPSULE BY MOUTH EVERY DAY (Patient taking differently: Take 100 mg by mouth daily.)   carbidopa-levodopa (SINEMET IR) 25-100 MG tablet TAKE 1 TABLET BY MOUTH THREE TIMES A DAY   Lacosamide (VIMPAT) 100 MG TABS Take 1 tablet (100 mg total) by mouth 2 (two) times daily.   levETIRAcetam (KEPPRA) 750 MG tablet TAKE 2 TABLETS (1,500 MG TOTAL) BY MOUTH 2 (TWO) TIMES DAILY.   levothyroxine (SYNTHROID) 75 MCG tablet TAKE 1 TABLET BY MOUTH EVERY DAY   metFORMIN (GLUCOPHAGE) 500 MG tablet TAKE 1 TABLET (500 MG TOTAL) BY MOUTH 2 (TWO) TIMES DAILY WITH A MEAL. NEED APPOINTMENT   omeprazole (PRILOSEC) 20 MG capsule TAKE 1 CAPSULE BY MOUTH EVERY DAY   vitamin B-12  (CYANOCOBALAMIN) 1000 MCG tablet Take 1,000 mcg by mouth daily.   No facility-administered encounter medications on file as of 03/03/2021.    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 ([place]) 08/16/2020   Fall 08/11/2020   Closed compression fracture of L5 lumbar vertebra, initial encounter ([place]) 08/11/2020   Seizures ([place]) 03/05/2018   Leukocytosis 03/05/2018   Diabetic foot ulcers ([place]) 04/23/2017   Parkinson disease ([place])    Gait instability    Diabetes mellitus type 2, controlled ([place]) 08/05/2015   Hypothyroidism 08/05/2015   Fall at home 10/02/2014   CNS aneurysm s/p coiling ([facility], 2002) 10/02/2014   High grade T7 central canal stenosis with T7 vertebral fracture s/p fall (02/2012) 10/02/2014   Right clinoid calcified meningioma vs thrombosed giant ICA aneurysm, conservative management. 10/02/2014   Syncope 02/05/2014   Drug-induced delirium(292.81) 05/16/2013   Seizure disorder ([place]) 04/21/2013   Seizure disorder, grand mal ([place]) 03/24/2013   Cervical compression fracture---C7 10/29/2012   Dysphagia 09/21/2012   Subdural hematoma ([place]) 09/20/2012   SOLAR KERATOSIS 02/17/2009   CARCINOMA, SKIN, SQUAMOUS CELL, FACE 04/19/2008   Hyperlipidemia, group D 01/27/2008   COLONIC POLYPS 11/15/2006   GERD 11/15/2006    Conditions to be addressed/monitored:DMII, Dementia, and Parkinson"s disease, seizures  Care Plan : RNCM:Neurological disease( Parkinson's disease, seizures, dementia)  Updates made by Dimitri Ped, RN since 03/03/2021 12:00 AM     Problem: Fall Risk related to Neurological disease( Parkinson's disease, seizures, dementia)   Priority: High  Long-Range Goal: Reduction of Fall and Fall-Related Injury due to Neurological disease( Parkinson's disease, seizures, dementia)   Start Date: 01/27/2021  Expected End Date: 06/18/2021  This Visit's Progress: On track  Recent Progress: On track   Priority: High  Note:   Current Barriers:  Knowledge Deficits related to fall precautions in patient with Neurological disease( Parkinson's disease, seizures, dementia) Decreased adherence to prescribed treatment for fall prevention Unable to independently self manage Neurological disease( Parkinson's disease, seizures, dementia) Unable to perform ADLs independently Unable to perform IADLs independently Knowledge Deficits related to self management of Neurological disease( Parkinson's disease, seizures, dementia) and fall prevention Chronic Disease Management support and education needs related to self management of Neurological disease( Parkinson's disease, seizures, dementia) and fall prevention Cognitive Deficits Spoke with son Keoki Mchargue.  States that pt has not had any falls since last RNCM call.   States he does using a walker.  States he has a paid caregiver from 1-5 who helps with cooking, cleaning and reminds pt to take his medications.  States pt sleeps a lot but will get up to eat.  Son and daughter share duties caring for pt.  States he has been better at taking his morning medications and has not had any seizures recently Clinical Goal(s):  patient will demonstrate improved adherence to prescribed treatment plan for decreasing falls as evidenced by patient reporting and review of EMR patient will verbalize using fall risk reduction strategies discussed patient will not experience additional falls patient will verbalize understanding of plan for self management of Neurological disease( Parkinson's disease, seizures, dementia) and fall prevention patient will meet with RN Care Manager to address self management of Neurological disease( Parkinson's disease, seizures, dementia) and fall prevention patient will take all medications exactly as prescribed and will call provider for medication related questions patient will demonstrate improved adherence to prescribed treatment plan for  self management of Neurological disease( Parkinson's disease, seizures, dementia) and fall prevention patient will verbalize basic understanding of self management of Neurological disease( Parkinson's disease, seizures, dementia) and fall prevention disease process and self health management plan patient will work with CM team pharmacist to address cost of medications and adherence issues patient will work with CM clinical social worker to provide caregiver support and resources-completed referral with resources given Interventions:  Collaboration with Pension scheme manager, Tommi Rumps, NP regarding development and update of comprehensive plan of care as evidenced by provider attestation and co-signature Inter-disciplinary care team collaboration (see longitudinal plan of care) Provided written and verbal education re: Potential causes of falls and Fall prevention strategies Reviewed medications and discussed potential side effects of medications such as dizziness and frequent urination Assessed for s/s of orthostatic hypotension Assessed for falls since last encounter-none Assessed patients knowledge of fall risk prevention secondary to previously provided education. Evaluation of current treatment plan related to self management of Neurological disease( Parkinson's disease, seizures, dementia) and fall prevention and patient's adherence to plan as established by provider. Reinforced education to patient re: self management of Neurological disease( Parkinson's disease, seizures, dementia) and fall prevention Reviewed medications with patient and discussed continued adherence Social Work referral for provide caregiver support and resources-LCSW completed referral with resources given Pharmacy referral for to address cost of medications and adherence issues-completed visit on 02/07/21 Discussed plans with patient for ongoing care management follow up and provided patient with direct contact information for care  management team Reviewed to follow up with neurology for regular follow up Self-Care Deficits:  Unable to self administer medications as  prescribed Unable to perform ADLs independently Unable to perform IADLs independently Patient Goals:  - Utilize walker (assistive device) appropriately with all ambulation - De-clutter walkways - Change positions slowly - Wear secure fitting shoes at all times with ambulation - Utilize home lighting for dim lit areas - Demonstrate self and pet awareness at all times - always use handrails on the stairs - install bathroom grab bars - keep a flashlight by the bed - keep cell phone with me always - make an emergency alert plan in case I fall - pick up clutter from the floors - remove throw rugs or use nonslip pads - use a cane or walker - use nightlight in the bathroom, halls - always wear low-heeled or flat shoes or slippers with nonskid soles Follow Up Plan: Telephone follow up appointment with care management team member scheduled for: 04/07/21 at 9 AM The patient has been provided with contact information for the care management team and has been advised to call with any health related questions or concerns.      Care Plan : RNCM:Diabetes Type 2 (Adult)  Updates made by Dimitri Ped, RN since 03/03/2021 12:00 AM     Problem: Lack of long term self managment of  Type 2 Diabetes   Priority: Medium     Long-Range Goal: Effective long term self managment of  Type 2 Diabetes   Start Date: 01/27/2021  Expected End Date: 06/18/2021  This Visit's Progress: On track  Recent Progress: On track  Priority: Medium  Note:   Objective:  Lab Results  Component Value Date   HGBA1C 5.6 08/11/2020   Lab Results  Component Value Date   CREATININE 0.85 08/23/2020   CREATININE 0.74 08/12/2020   CREATININE 0.87 08/11/2020   No results found for: EGFR Current Barriers:  Knowledge Deficits related to basic Diabetes pathophysiology and self  care/management Knowledge Deficits related to medications used for management of diabetes Cognitive Deficits Does not use cbg meter  Unable to independently self manage Type 2 Diabetes Unable to perform ADLs independently Unable to perform IADLs independently Spoke with son D[place] Ar [employer]. States he checked his CBG about 2 weeks ago and it was 110.  States he like to eat cereal for breakfast and his favorite type is cinnamon toast crunch.  States pt will eat breakfast, not much for lunch and a good supper of a meat and 2 vegetables.  States he does drink Ensure sometimes. Case Manager Clinical Goal(s):  patient will demonstrate improved adherence to prescribed treatment plan for diabetes self care/management as evidenced by: adherence to ADA/ carb modified diet adherence to prescribed medication regimen contacting provider for new or worsened symptoms or questions Interventions:  Collaboration with Carlisle Cater, Tommi Rumps, NP regarding development and update of comprehensive plan of care as evidenced by provider attestation and co-signature Inter-disciplinary care team collaboration (see longitudinal plan of care) Reinforced  education to patient about basic DM disease process Reviewed medications with patient and discussed importance of medication adherence Discussed plans with patient for ongoing care management follow up and provided patient with direct contact information for care management team Reinforced s/sx of  hypoglycemia and hyperglycemia and importance of correct treatment Referral made to pharmacy team for assistance with issues with cost of medications and adherence completed visit 02/07/21 Referral made to social work team for assistance with caregiver support, level of care issues and community resources-completed referral with resources given Review of patient status, including review of consultants reports, relevant laboratory  and other test results, and medications  completed. Reinforced to try diabetic nutritional supplements to support his nutritional needs Reviewed to try to get pt to eat more protein and less concentrated sweets  Reviewed sources of protein Self-Care Activities - Self administers oral medications as prescribed Attends all scheduled provider appointments Adheres to prescribed ADA/carb modified Patient Goals: - change to whole grain breads, cereal, pasta - drink 6 to 8 glasses of water each day - fill half of plate with vegetables - manage portion size - read food labels for fat, fiber, carbohydrates and portion size - reduce red meat to 2 to 3 times a week - switch to sugar-free drinks - schedule appointment with eye doctor - check feet daily for cuts, sores or redness - wash and dry feet carefully every day - wear comfortable, cotton socks - wear comfortable, well-fitting shoes -drink diabetic nutritional supplement daily as needed Follow Up Plan: Telephone follow up appointment with care management team member scheduled for: 04/07/21 at 9 AM The patient has been provided with contact information for the care management team and has been advised to call with any health related questions or concerns.       Plan:Telephone follow up appointment with care management team member scheduled for:  04/07/21 and The patient has been provided with contact information for the care management team and has been advised to call with any health related questions or concerns.  Peter Garter RN, Jackquline [place], CDE Care Management Coordinator [facility] [phone], Mobile [phone]

## 2021-03-15 NOTE — Patient Instructions (Signed)
Hi Gerald Leach and Gerald Leach,  It was great to get to meet you over the telephone! Below is a summary of some of the topics we discussed.   Please reach out to me if you have any questions or need anything before our follow up!  Best, Maddie  [name], PharmD, [facility] [phone]   Visit Information   Goals Addressed   None    Patient Care Plan: LCSW Plan of Care.     Problem Identified: Find Help in My Community. Resolved 02/24/2021  Priority: High     Goal: Find Help in My Community. Completed 02/24/2021  Start Date: 01/17/2021  Expected End Date: 02/24/2021  This Visit's Progress: On track  Recent Progress: On track  Priority: High  Note:   Current Barriers:   Patient with Dysphagia, Diabetes Mellitus Type II, Parkinson's Disease, Dementia Due to Parkinson's Disease Without Behavioral Disturbance, Seizure Disorder, Gait Instability and History of Falls. Patient is unable to self-administer medications as prescribed or consistently perform ADL's/IADL's independently. Clinical Goals:  Patient will have increased hours of in-home care services, as well as [place] in place, and will receive prepared meal delivery services through [employer] of [facility], [employer].    Daughter will work with [employer] in order to obtain [employer], the [employer] in order to obtain Adult Medicaid, and [employer] of [place] to [employer].   Patient will demonstrate improved health management independence as evidenced by having private agency sitter services in place, as well as [place] through [employer].   Clinical Interventions: Collaboration with Nurse Practitioner, [name] regarding development and update of comprehensive plan of care as evidenced by provider attestation and  co-signature. Inter-disciplinary care team collaboration (see longitudinal plan of care). Interventions performed:  Problem Solving/Task Centered, Psychoeducation/Health Education, Quality of Sleep Assessed and Sleep Hygiene Techniques Promoted, Caregiver Stress Acknowledged and Consideration of In-Home Care Services Encouraged. Patient Goals/Self-Care Activities:  Daughter has agreed to accept all calls from [employer] (# [phone]) in order for them to schedule an initial home assessment to determine eligibility for [place].   Reinforced to daughter the importance of applying for Adult Medicaid, through the [place] (# [phone]), notifying LCSW directly if she needs assistance with application completion and/or submission.    Daughter has agreed to periodically follow-up with [employer] [facility] [place] [phone]) to check the status of your application for [employer]. Continue to receive private agency sitter services from 1:00 pm - 5:00 pm, Monday through Friday, to assist with supervision, ADL's and IADL's. Contact LCSW directly (# [id number]) if you have questions, need assistance, or if additional social work needs are identified within the near future. Follow-Up:  No Follow-Up Required, Per Daughter    Patient Care Plan: RNCM:Neurological disease( Parkinson's disease, seizures, dementia)     Problem Identified: Fall Risk related to Neurological disease( Parkinson's disease, seizures, dementia)   Priority: High     Long-Range Goal: Reduction of Fall and Fall-Related Injury due to Neurological disease( Parkinson's disease, seizures, dementia)   Start Date: 01/27/2021  Expected End Date: 06/18/2021  This Visit's Progress: On track  Recent Progress: On track  Priority: High  Note:   Current Barriers:  Knowledge Deficits related to fall precautions in patient with Neurological disease(  Parkinson's disease, seizures, dementia) Decreased adherence to prescribed treatment for fall prevention Unable  to independently self manage Neurological disease( Parkinson's disease, seizures, dementia) Unable to perform ADLs independently Unable to perform IADLs independently Knowledge Deficits related to self management of Neurological disease( Parkinson's disease, seizures, dementia) and fall prevention Chronic Disease Management support and education needs related to self management of Neurological disease( Parkinson's disease, seizures, dementia) and fall prevention Cognitive Deficits Spoke with son Gerald Leach.  States that pt has not had any falls since last RNCM call.   States he does using a walker.  States he has a paid caregiver from 1-5 who helps with cooking, cleaning and reminds pt to take his medications.  States pt sleeps a lot but will get up to eat.  Son and daughter share duties caring for pt.  States he has been better at taking his morning medications and has not had any seizures recently Clinical Goal(s):  patient will demonstrate improved adherence to prescribed treatment plan for decreasing falls as evidenced by patient reporting and review of EMR patient will verbalize using fall risk reduction strategies discussed patient will not experience additional falls patient will verbalize understanding of plan for self management of Neurological disease( Parkinson's disease, seizures, dementia) and fall prevention patient will meet with RN Care Manager to address self management of Neurological disease( Parkinson's disease, seizures, dementia) and fall prevention patient will take all medications exactly as prescribed and will call provider for medication related questions patient will demonstrate improved adherence to prescribed treatment plan for self management of Neurological disease( Parkinson's disease, seizures, dementia) and fall prevention patient will verbalize basic  understanding of self management of Neurological disease( Parkinson's disease, seizures, dementia) and fall prevention disease process and self health management plan patient will work with CM team pharmacist to address cost of medications and adherence issues patient will work with CM clinical social worker to provide caregiver support and resources-completed referral with resources given Interventions:  Collaboration with Pension scheme manager, Tommi Rumps, NP regarding development and update of comprehensive plan of care as evidenced by provider attestation and co-signature Inter-disciplinary care team collaboration (see longitudinal plan of care) Provided written and verbal education re: Potential causes of falls and Fall prevention strategies Reviewed medications and discussed potential side effects of medications such as dizziness and frequent urination Assessed for s/s of orthostatic hypotension Assessed for falls since last encounter-none Assessed patients knowledge of fall risk prevention secondary to previously provided education. Evaluation of current treatment plan related to self management of Neurological disease( Parkinson's disease, seizures, dementia) and fall prevention and patient's adherence to plan as established by provider. Reinforced education to patient re: self management of Neurological disease( Parkinson's disease, seizures, dementia) and fall prevention Reviewed medications with patient and discussed continued adherence Social Work referral for provide caregiver support and resources-LCSW completed referral with resources given Pharmacy referral for to address cost of medications and adherence issues-completed visit on 02/07/21 Discussed plans with patient for ongoing care management follow up and provided patient with direct contact information for care management team Reviewed to follow up with neurology for regular follow up Self-Care Deficits:  Unable to self administer medications as  prescribed Unable to perform ADLs independently Unable to perform IADLs independently Patient Goals:  - Utilize walker (assistive device) appropriately with all ambulation - De-clutter walkways - Change positions slowly - Wear secure fitting shoes at all times with ambulation - Utilize home lighting for dim lit areas - Demonstrate self and pet awareness at all times - always use handrails on the stairs - install bathroom grab bars - keep a  flashlight by the bed - keep cell phone with me always - make an emergency alert plan in case I fall - pick up clutter from the floors - remove throw rugs or use nonslip pads - use a cane or walker - use nightlight in the bathroom, halls - always wear low-heeled or flat shoes or slippers with nonskid soles Follow Up Plan: Telephone follow up appointment with care management team member scheduled for: 04/07/21 at 9 AM The patient has been provided with contact information for the care management team and has been advised to call with any health related questions or concerns.      Patient Care Plan: RNCM:Diabetes Type 2 (Adult)     Problem Identified: Lack of long term self managment of  Type 2 Diabetes   Priority: Medium     Long-Range Goal: Effective long term self managment of  Type 2 Diabetes   Start Date: 01/27/2021  Expected End Date: 06/18/2021  This Visit's Progress: On track  Recent Progress: On track  Priority: Medium  Note:   Objective:  Lab Results  Component Value Date   HGBA1C 5.6 08/11/2020   Lab Results  Component Value Date   CREATININE 0.85 08/23/2020   CREATININE 0.74 08/12/2020   CREATININE 0.87 08/11/2020   No results found for: EGFR Current Barriers:  Knowledge Deficits related to basic Diabetes pathophysiology and self care/management Knowledge Deficits related to medications used for management of diabetes Cognitive Deficits Does not use cbg meter  Unable to independently self manage Type 2 Diabetes Unable  to perform ADLs independently Unable to perform IADLs independently Spoke with son Gerald Leach Designated Party Release. States he checked his CBG about 2 weeks ago and it was 110.  States he like to eat cereal for breakfast and his favorite type is cinnamon toast crunch.  States pt will eat breakfast, not much for lunch and a good supper of a meat and 2 vegetables.  States he does drink Ensure sometimes. Case Manager Clinical Goal(s):  patient will demonstrate improved adherence to prescribed treatment plan for diabetes self care/management as evidenced by: adherence to ADA/ carb modified diet adherence to prescribed medication regimen contacting provider for new or worsened symptoms or questions Interventions:  Collaboration with Carlisle Cater, Tommi Rumps, NP regarding development and update of comprehensive plan of care as evidenced by provider attestation and co-signature Inter-disciplinary care team collaboration (see longitudinal plan of care) Reinforced  education to patient about basic DM disease process Reviewed medications with patient and discussed importance of medication adherence Discussed plans with patient for ongoing care management follow up and provided patient with direct contact information for care management team Reinforced s/sx of  hypoglycemia and hyperglycemia and importance of correct treatment Referral made to pharmacy team for assistance with issues with cost of medications and adherence completed visit 02/07/21 Referral made to social work team for assistance with caregiver support, level of care issues and community resources-completed referral with resources given Review of patient status, including review of consultants reports, relevant laboratory and other test results, and medications completed. Reinforced to try diabetic nutritional supplements to support his nutritional needs Reviewed to try to get pt to eat more protein and less concentrated sweets  Reviewed sources of  protein Self-Care Activities - Self administers oral medications as prescribed Attends all scheduled provider appointments Adheres to prescribed ADA/carb modified Patient Goals: - change to whole grain breads, cereal, pasta - drink 6 to 8 glasses of water each day - fill half of plate with  vegetables - manage portion size - read food labels for fat, fiber, carbohydrates and portion size - reduce red meat to 2 to 3 times a week - switch to sugar-free drinks - schedule appointment with eye doctor - check feet daily for cuts, sores or redness - wash and dry feet carefully every day - wear comfortable, cotton socks - wear comfortable, well-fitting shoes -drink diabetic nutritional supplement daily as needed Follow Up Plan: Telephone follow up appointment with care management team member scheduled for: 04/07/21 at 9 AM The patient has been provided with contact information for the care management team and has been advised to call with any health related questions or concerns.      Patient Care Plan: CCM Pharmacy Care Plan     Problem Identified: Problem: Diabetes, GERD, Hypothyroidism, and Parkinson's disease and seizures      Long-Range Goal: Patient-Specific Goal   Start Date: 02/07/2021  Expected End Date: 02/07/2021  This Visit's Progress: On track  Priority: High  Note:   Current Barriers:  Unable to independently afford treatment regimen Unable to independently monitor therapeutic efficacy Unable to self administer medications as prescribed  Pharmacist Clinical Goal(s):  Patient will achieve adherence to monitoring guidelines and medication adherence to achieve therapeutic efficacy through collaboration with PharmD and provider.   Interventions: 1:1 collaboration with [name], NP regarding development and update of comprehensive plan of care as evidenced by provider attestation and co-signature Inter-disciplinary care team collaboration (see longitudinal plan of  care) Comprehensive medication review performed; medication list updated in electronic medical record  Diabetes (A1c goal <7%) -Controlled -Current medications: Metformin 500 mg 1 tablet twice daily with meals -Medications previously tried: none  -Current home glucose readings fasting glucose: 99-120 post prandial glucose: none -Denies hypoglycemic/hyperglycemic symptoms -Current meal patterns: he doesn't have much of an appetite overall breakfast: cereal or bar  lunch: n/a  dinner: meat and 2 vegetables snacks: none drinks: did not discuss -Current exercise: none -Educated on Exercise goal of 150 minutes per week; Benefits of routine self-monitoring of blood sugar; Carbohydrate counting and/or plate method -Counseled to check feet daily and get yearly eye exams -Counseled on diet and exercise extensively Recommended to continue current medication  GERD (Goal: minimize symptoms) -Controlled -Current treatment  Omeprazole 20 mg 1 capsule daily -Medications previously tried: none  -Recommended to continue current medication  Hypothyroidism (Goal: TSH 0.34-4.5) -Uncontrolled -Current treatment  Levothyroxine 75 mcg 1 tablet daily -Medications previously tried: none  -Counseled on importance of taking this on an empty stomach separate from other medications and with water. Recommended repeat TSH.  Seizures (Goal: prevent seizures) -Controlled -Current treatment  Vimpat 100 mg 1 tablet twice daily Levetiracetam 750 mg 2 tablets twice daily -Medications previously tried: none  -Assessed patient finances. Patient may qualify for Vimpat patient assistance.  Parkinson's disease (Goal: minimize symptoms and slow disease progression) -Not ideally controlled -Current treatment  Amantadine 100 mg 1 capsule daily Carbidopa-levodopa IR 25-100 mg 1 tablet three times daily -Medications previously tried: none  -Educated on differences in medications for treatment.   Health  Maintenance -Vaccine gaps: COVID, shingrix, tetanus, influenza -Current therapy:  Vitamin B12 1000 mcg daily -Educated on Cost vs benefit of each product must be carefully weighed by individual consumer -Patient is satisfied with current therapy and denies issues -Recommended to continue current medication  Patient Goals/Self-Care Activities Patient will:  - take medications as prescribed focus on medication adherence by using a pillbox and considering the use of a timer  to help with compliance.  Follow Up Plan: The care management team will reach out to the patient again over the next 30 days.       Gerald Leach was given information about Chronic Care Management services today including:  CCM service includes personalized support from designated clinical staff supervised by his physician, including individualized plan of care and coordination with other care providers 24/7 contact phone numbers for assistance for urgent and routine care needs. Standard insurance, coinsurance, copays and deductibles apply for chronic care management only during months in which we provide at least 20 minutes of these services. Most insurances cover these services at 100%, however patients may be responsible for any copay, coinsurance and/or deductible if applicable. This service may help you avoid the need for more expensive face-to-face services. Only one practitioner may furnish and bill the service in a calendar month. The patient may stop CCM services at any time (effective at the end of the month) by phone call to the office staff.  Patient agreed to services and verbal consent obtained.   The patient verbalized understanding of instructions, educational materials, and care plan provided today and agreed to receive a mailed copy of patient instructions, educational materials, and care plan.  The pharmacy team will reach out to the patient again over the next 30 days.   Viona Gilmore, [facility]

## 2021-03-17 ENCOUNTER — Emergency Department (HOSPITAL_COMMUNITY): Payer: Medicare Other

## 2021-03-17 ENCOUNTER — Other Ambulatory Visit: Payer: Self-pay

## 2021-03-17 ENCOUNTER — Inpatient Hospital Stay (HOSPITAL_COMMUNITY)
Admission: EM | Admit: 2021-03-17 | Discharge: 2021-03-24 | DRG: 100 | Disposition: A | Discharge status: Skilled Nursing Facility | Payer: Medicare Other | Attending: Internal Medicine | Admitting: Internal Medicine

## 2021-03-17 ENCOUNTER — Encounter (HOSPITAL_COMMUNITY): Payer: Self-pay

## 2021-03-17 DIAGNOSIS — G9341 Metabolic encephalopathy: Secondary | ICD-10-CM | POA: Diagnosis not present

## 2021-03-17 DIAGNOSIS — G40909 Epilepsy, unspecified, not intractable, without status epilepticus: Principal | ICD-10-CM | POA: Diagnosis present

## 2021-03-17 DIAGNOSIS — F0280 Dementia in other diseases classified elsewhere without behavioral disturbance: Secondary | ICD-10-CM | POA: Diagnosis not present

## 2021-03-17 DIAGNOSIS — Z9181 History of falling: Secondary | ICD-10-CM

## 2021-03-17 DIAGNOSIS — G40919 Epilepsy, unspecified, intractable, without status epilepticus: Secondary | ICD-10-CM | POA: Diagnosis present

## 2021-03-17 DIAGNOSIS — I10 Essential (primary) hypertension: Secondary | ICD-10-CM | POA: Diagnosis present

## 2021-03-17 DIAGNOSIS — B351 Tinea unguium: Secondary | ICD-10-CM | POA: Diagnosis not present

## 2021-03-17 DIAGNOSIS — I70203 Unspecified atherosclerosis of native arteries of extremities, bilateral legs: Secondary | ICD-10-CM | POA: Diagnosis not present

## 2021-03-17 DIAGNOSIS — I482 Chronic atrial fibrillation, unspecified: Secondary | ICD-10-CM | POA: Diagnosis not present

## 2021-03-17 DIAGNOSIS — R569 Unspecified convulsions: Secondary | ICD-10-CM | POA: Diagnosis not present

## 2021-03-17 DIAGNOSIS — Z7984 Long term (current) use of oral hypoglycemic drugs: Secondary | ICD-10-CM

## 2021-03-17 DIAGNOSIS — E785 Hyperlipidemia, unspecified: Secondary | ICD-10-CM | POA: Diagnosis not present

## 2021-03-17 DIAGNOSIS — F05 Delirium due to known physiological condition: Secondary | ICD-10-CM | POA: Diagnosis present

## 2021-03-17 DIAGNOSIS — M6281 Muscle weakness (generalized): Secondary | ICD-10-CM | POA: Diagnosis not present

## 2021-03-17 DIAGNOSIS — E039 Hypothyroidism, unspecified: Secondary | ICD-10-CM | POA: Diagnosis present

## 2021-03-17 DIAGNOSIS — R262 Difficulty in walking, not elsewhere classified: Secondary | ICD-10-CM | POA: Diagnosis not present

## 2021-03-17 DIAGNOSIS — Z03818 Encounter for observation for suspected exposure to other biological agents ruled out: Secondary | ICD-10-CM | POA: Diagnosis not present

## 2021-03-17 DIAGNOSIS — E871 Hypo-osmolality and hyponatremia: Secondary | ICD-10-CM | POA: Diagnosis present

## 2021-03-17 DIAGNOSIS — G20 Parkinson's disease: Secondary | ICD-10-CM | POA: Diagnosis not present

## 2021-03-17 DIAGNOSIS — R001 Bradycardia, unspecified: Secondary | ICD-10-CM | POA: Diagnosis not present

## 2021-03-17 DIAGNOSIS — Z20822 Contact with and (suspected) exposure to covid-19: Secondary | ICD-10-CM | POA: Diagnosis present

## 2021-03-17 DIAGNOSIS — R9431 Abnormal electrocardiogram [ECG] [EKG]: Secondary | ICD-10-CM | POA: Diagnosis not present

## 2021-03-17 DIAGNOSIS — I4892 Unspecified atrial flutter: Secondary | ICD-10-CM | POA: Diagnosis not present

## 2021-03-17 DIAGNOSIS — K219 Gastro-esophageal reflux disease without esophagitis: Secondary | ICD-10-CM | POA: Diagnosis present

## 2021-03-17 DIAGNOSIS — Z79899 Other long term (current) drug therapy: Secondary | ICD-10-CM | POA: Diagnosis not present

## 2021-03-17 DIAGNOSIS — E1151 Type 2 diabetes mellitus with diabetic peripheral angiopathy without gangrene: Secondary | ICD-10-CM | POA: Diagnosis not present

## 2021-03-17 DIAGNOSIS — M2041 Other hammer toe(s) (acquired), right foot: Secondary | ICD-10-CM | POA: Diagnosis not present

## 2021-03-17 DIAGNOSIS — E783 Hyperchylomicronemia: Secondary | ICD-10-CM

## 2021-03-17 DIAGNOSIS — I4891 Unspecified atrial fibrillation: Secondary | ICD-10-CM | POA: Diagnosis not present

## 2021-03-17 DIAGNOSIS — Z79890 Hormone replacement therapy (postmenopausal): Secondary | ICD-10-CM | POA: Diagnosis not present

## 2021-03-17 DIAGNOSIS — Z743 Need for continuous supervision: Secondary | ICD-10-CM | POA: Diagnosis not present

## 2021-03-17 DIAGNOSIS — Z781 Physical restraint status: Secondary | ICD-10-CM | POA: Diagnosis not present

## 2021-03-17 DIAGNOSIS — I739 Peripheral vascular disease, unspecified: Secondary | ICD-10-CM | POA: Diagnosis not present

## 2021-03-17 DIAGNOSIS — W19XXXA Unspecified fall, initial encounter: Secondary | ICD-10-CM | POA: Diagnosis not present

## 2021-03-17 DIAGNOSIS — I69828 Other speech and language deficits following other cerebrovascular disease: Secondary | ICD-10-CM | POA: Diagnosis not present

## 2021-03-17 DIAGNOSIS — R4781 Slurred speech: Secondary | ICD-10-CM | POA: Diagnosis not present

## 2021-03-17 DIAGNOSIS — I4821 Permanent atrial fibrillation: Secondary | ICD-10-CM | POA: Diagnosis not present

## 2021-03-17 DIAGNOSIS — R2689 Other abnormalities of gait and mobility: Secondary | ICD-10-CM | POA: Diagnosis not present

## 2021-03-17 DIAGNOSIS — S0990XA Unspecified injury of head, initial encounter: Secondary | ICD-10-CM | POA: Diagnosis not present

## 2021-03-17 DIAGNOSIS — E119 Type 2 diabetes mellitus without complications: Secondary | ICD-10-CM

## 2021-03-17 LAB — CBC WITH DIFFERENTIAL/PLATELET
Abs Immature Granulocytes: 0.05 10*3/uL (ref 0.00–0.07)
Basophils Absolute: 0 10*3/uL (ref 0.0–0.1)
Basophils Relative: 0 %
Eosinophils Absolute: 0.1 10*3/uL (ref 0.0–0.5)
Eosinophils Relative: 1 %
HCT: 45.7 % (ref 39.0–52.0)
Hemoglobin: 15 g/dL (ref 13.0–17.0)
Immature Granulocytes: 1 %
Lymphocytes Relative: 22 %
Lymphs Abs: 2.2 10*3/uL (ref 0.7–4.0)
MCH: 29.5 pg (ref 26.0–34.0)
MCHC: 32.8 g/dL (ref 30.0–36.0)
MCV: 90 fL (ref 80.0–100.0)
Monocytes Absolute: 0.9 10*3/uL (ref 0.1–1.0)
Monocytes Relative: 9 %
Neutro Abs: 6.7 10*3/uL (ref 1.7–7.7)
Neutrophils Relative %: 67 %
Platelets: 262 10*3/uL (ref 150–400)
RBC: 5.08 MIL/uL (ref 4.22–5.81)
RDW: 12.9 % (ref 11.5–15.5)
WBC: 9.9 10*3/uL (ref 4.0–10.5)
nRBC: 0 % (ref 0.0–0.2)

## 2021-03-17 LAB — COMPREHENSIVE METABOLIC PANEL
ALT: 9 U/L (ref 0–44)
AST: 14 U/L — ABNORMAL LOW (ref 15–41)
Albumin: 4.2 g/dL (ref 3.5–5.0)
Alkaline Phosphatase: 85 U/L (ref 38–126)
Anion gap: 13 (ref 5–15)
BUN: 14 mg/dL (ref 8–23)
CO2: 27 mmol/L (ref 22–32)
Calcium: 9.4 mg/dL (ref 8.9–10.3)
Chloride: 98 mmol/L (ref 98–111)
Creatinine, Ser: 0.75 mg/dL (ref 0.61–1.24)
GFR, Estimated: >60 mL/min (ref >60)
Glucose, Bld: 90 mg/dL (ref 70–99)
Potassium: 4.4 mmol/L (ref 3.5–5.1)
Sodium: 138 mmol/L (ref 135–145)
Total Bilirubin: 0.8 mg/dL (ref 0.3–1.2)
Total Protein: 8.1 g/dL (ref 6.5–8.1)

## 2021-03-17 LAB — URINALYSIS, ROUTINE W REFLEX MICROSCOPIC
Bilirubin Urine: NEGATIVE
Glucose, UA: NEGATIVE mg/dL
Hgb urine dipstick: NEGATIVE
Ketones, ur: 5 mg/dL — AB
Leukocytes,Ua: NEGATIVE
Nitrite: NEGATIVE
Protein, ur: NEGATIVE mg/dL
Specific Gravity, Urine: 1.016 (ref 1.005–1.030)
pH: 6 (ref 5.0–8.0)

## 2021-03-17 MED ORDER — SODIUM CHLORIDE 0.9 % IV SOLN
100.0000 mg | Freq: Once | INTRAVENOUS | Status: AC
  Administered 2021-03-17: 100 mg via INTRAVENOUS
  Filled 2021-03-17: qty 10

## 2021-03-17 MED ORDER — LACOSAMIDE 50 MG PO TABS: ORAL_TABLET | ORAL | 0 refills | Status: DC

## 2021-03-17 NOTE — Discharge Instructions (Addendum)
Take the new 50 mg tab with your previous 100 mg tab twice a day for a total of 150 mg twice a day.  Follow-up with [name] for further adjustment of the medication.

## 2021-03-17 NOTE — ED Notes (Addendum)
RN attempted 2x to call patients daughter, Theodosia Paling, went straight to voicemail. RN called and reached patients son, but he said he is 3 hours away at his second home. The 3rd family member Shirlean Mylar also lives 3 hours away. Patient is discharged and needs a ride home.

## 2021-03-17 NOTE — ED Triage Notes (Signed)
Pt came from home via EMS. C/c: fall with possible seizure activity for approximately a minute. Pt alert and oriented to baseline upon EMS arrival. PMH of parkinsons, afib, does not appear to be on a blood thinner. Pt was walking on sidewalk today and fell with possible seizure activity/ syncope, hematoma on left side of head. Pt denies LOC. Daughter was with pt upon fall occurrence.
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
## 2021-03-17 NOTE — ED Notes (Addendum)
Called daughter and spoke with Shirlean Mylar and her husband about pt being up for discharge and how he doesn't qualify for transport via [facility] as pt does live alone and is ambulatory. Daughter states that pt "should not being living alone", but it is a decision that her siblings support and she is uncomfortable with. She states that even if she comes to pick up the pt, she is "unable to move him into the house". At this point husband came on the phone and states that "if you want to put him in the lobby, do it. But we are not coming. You should be calling social services." Will discuss situation with charge RN, MD and primary RN, Lysbeth Galas.
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
## 2021-03-17 NOTE — ED Notes (Signed)
RN received call from Shirlean Mylar stating "How can a hospital just kick a patient out." She insisted the patient be admitted to the hospital. RN stated the patient did not meet requirements to be admitted after scans and lab work and that this was the doctors decision. RN attempted 4 times to reach [place], who's number was left to pick the patient up after she visited earlier. No answer every time. RN called [name], patients son, who said he reached [place] and she said she would pick the patient up. RN attempted to call her again, straight to voicemail. Shirlean Mylar, patients daughter, is very irritated and upset, saying that the father should not be living alone at his house, saying that he needs a ride home from the hospital. RN stated that patient would have to go to the lobby if no one is able to answer. Shirlean Mylar wanted patient to stay and hold in the ED tonight. RN said that we cannot do that as there are others to be seen.

## 2021-03-18 DIAGNOSIS — R569 Unspecified convulsions: Secondary | ICD-10-CM | POA: Diagnosis not present

## 2021-03-18 LAB — CBC WITH DIFFERENTIAL/PLATELET
Abs Immature Granulocytes: 0.01 10*3/uL (ref 0.00–0.07)
Basophils Absolute: 0 10*3/uL (ref 0.0–0.1)
Basophils Relative: 1 %
Eosinophils Absolute: 0.1 10*3/uL (ref 0.0–0.5)
Eosinophils Relative: 1 %
HCT: 44.4 % (ref 39.0–52.0)
Hemoglobin: 14.6 g/dL (ref 13.0–17.0)
Immature Granulocytes: 0 %
Lymphocytes Relative: 26 %
Lymphs Abs: 2 10*3/uL (ref 0.7–4.0)
MCH: 29.3 pg (ref 26.0–34.0)
MCHC: 32.9 g/dL (ref 30.0–36.0)
MCV: 89.2 fL (ref 80.0–100.0)
Monocytes Absolute: 0.7 10*3/uL (ref 0.1–1.0)
Monocytes Relative: 9 %
Neutro Abs: 4.9 10*3/uL (ref 1.7–7.7)
Neutrophils Relative %: 63 %
Platelets: 277 10*3/uL (ref 150–400)
RBC: 4.98 MIL/uL (ref 4.22–5.81)
RDW: 12.7 % (ref 11.5–15.5)
WBC: 7.6 10*3/uL (ref 4.0–10.5)
nRBC: 0 % (ref 0.0–0.2)

## 2021-03-18 LAB — COMPREHENSIVE METABOLIC PANEL
ALT: 11 U/L (ref 0–44)
AST: 16 U/L (ref 15–41)
Albumin: 3.7 g/dL (ref 3.5–5.0)
Alkaline Phosphatase: 81 U/L (ref 38–126)
Anion gap: 10 (ref 5–15)
BUN: 13 mg/dL (ref 8–23)
CO2: 28 mmol/L (ref 22–32)
Calcium: 8.8 mg/dL — ABNORMAL LOW (ref 8.9–10.3)
Chloride: 98 mmol/L (ref 98–111)
Creatinine, Ser: 0.63 mg/dL (ref 0.61–1.24)
GFR, Estimated: >60 mL/min (ref >60)
Glucose, Bld: 114 mg/dL — ABNORMAL HIGH (ref 70–99)
Potassium: 3.9 mmol/L (ref 3.5–5.1)
Sodium: 136 mmol/L (ref 135–145)
Total Bilirubin: 1.1 mg/dL (ref 0.3–1.2)
Total Protein: 7.4 g/dL (ref 6.5–8.1)

## 2021-03-18 LAB — CBG MONITORING, ED: Glucose-Capillary: 99 mg/dL (ref 70–99)

## 2021-03-18 LAB — SARS CORONAVIRUS 2 (TAT 6-24 HRS): SARS Coronavirus 2: NEGATIVE

## 2021-03-18 LAB — GLUCOSE, CAPILLARY: Glucose-Capillary: 83 mg/dL (ref 70–99)

## 2021-03-18 LAB — HEMOGLOBIN A1C
Hgb A1c MFr Bld: 5.4 % (ref 4.8–5.6)
Mean Plasma Glucose: 108.28 mg/dL

## 2021-03-18 MED ORDER — VITAMIN B-12 1000 MCG PO TABS
1000.0000 ug | ORAL_TABLET | Freq: Every day | ORAL | Status: DC
  Administered 2021-03-18 – 2021-03-24 (×4): 1000 ug via ORAL
  Filled 2021-03-18 (×6): qty 1

## 2021-03-18 MED ORDER — LEVOTHYROXINE SODIUM 50 MCG PO TABS
75.0000 ug | ORAL_TABLET | Freq: Every day | ORAL | Status: DC
  Administered 2021-03-21 – 2021-03-23 (×3): 75 ug via ORAL
  Filled 2021-03-18 (×7): qty 1

## 2021-03-18 MED ORDER — PANTOPRAZOLE SODIUM 40 MG PO TBEC
40.0000 mg | DELAYED_RELEASE_TABLET | Freq: Every day | ORAL | Status: DC
  Administered 2021-03-18 – 2021-03-24 (×4): 40 mg via ORAL
  Filled 2021-03-18 (×6): qty 1

## 2021-03-18 MED ORDER — ONDANSETRON HCL 4 MG PO TABS: 4.0000 mg | ORAL_TABLET | Freq: Four times a day (QID) | ORAL | Status: DC | PRN

## 2021-03-18 MED ORDER — LEVETIRACETAM IN NACL 1000 MG/100ML IV SOLN
1000.0000 mg | Freq: Once | INTRAVENOUS | Status: AC
  Administered 2021-03-18: 1000 mg via INTRAVENOUS
  Filled 2021-03-18: qty 100

## 2021-03-18 MED ORDER — AMANTADINE HCL 100 MG PO CAPS
100.0000 mg | ORAL_CAPSULE | Freq: Every day | ORAL | Status: DC
  Administered 2021-03-18 – 2021-03-24 (×4): 100 mg via ORAL
  Filled 2021-03-18 (×7): qty 1

## 2021-03-18 MED ORDER — LEVETIRACETAM 500 MG PO TABS
1500.0000 mg | ORAL_TABLET | Freq: Two times a day (BID) | ORAL | Status: DC
  Administered 2021-03-18: 1500 mg via ORAL
  Filled 2021-03-18 (×3): qty 3

## 2021-03-18 MED ORDER — CARBIDOPA-LEVODOPA 25-100 MG PO TABS
1.0000 | ORAL_TABLET | Freq: Three times a day (TID) | ORAL | Status: DC
  Administered 2021-03-18 – 2021-03-24 (×12): 1 via ORAL
  Filled 2021-03-18 (×18): qty 1

## 2021-03-18 MED ORDER — INSULIN ASPART 100 UNIT/ML IJ SOLN
0.0000 [IU] | Freq: Three times a day (TID) | INTRAMUSCULAR | Status: DC
  Administered 2021-03-20: 1 [IU] via SUBCUTANEOUS
  Filled 2021-03-18: qty 0.06

## 2021-03-18 MED ORDER — LACOSAMIDE 50 MG PO TABS
150.0000 mg | ORAL_TABLET | Freq: Two times a day (BID) | ORAL | Status: DC
  Administered 2021-03-18: 150 mg via ORAL
  Filled 2021-03-18 (×3): qty 3

## 2021-03-18 MED ORDER — LEVOTHYROXINE SODIUM 50 MCG PO TABS
75.0000 ug | ORAL_TABLET | Freq: Every day | ORAL | Status: DC
  Administered 2021-03-18: 75 ug via ORAL
  Filled 2021-03-18: qty 1

## 2021-03-18 MED ORDER — ONDANSETRON HCL 4 MG/2ML IJ SOLN: 4.0000 mg | Freq: Four times a day (QID) | INTRAMUSCULAR | Status: DC | PRN

## 2021-03-18 MED ORDER — ACETAMINOPHEN 650 MG RE SUPP: 650.0000 mg | Freq: Four times a day (QID) | RECTAL | Status: DC | PRN

## 2021-03-18 MED ORDER — ACETAMINOPHEN 325 MG PO TABS: 650.0000 mg | ORAL_TABLET | Freq: Four times a day (QID) | ORAL | Status: DC | PRN

## 2021-03-18 NOTE — ED Notes (Signed)
Pt seen standing at foot of bed with linens soiled. Assisted Carlos NT to clean pt, change linens, and redirect to bed.
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
## 2021-03-18 NOTE — ED Notes (Signed)
Breakfast provided.

## 2021-03-18 NOTE — ED Notes (Signed)
Patient had seizure like activity, limbs shaking, incontinence, altered mentation. Dr. Dina Rich at bedside. Medications ordered. Side rails are padded.

## 2021-03-19 DIAGNOSIS — G40919 Epilepsy, unspecified, intractable, without status epilepticus: Secondary | ICD-10-CM

## 2021-03-19 DIAGNOSIS — I4821 Permanent atrial fibrillation: Secondary | ICD-10-CM | POA: Diagnosis present

## 2021-03-19 DIAGNOSIS — R9431 Abnormal electrocardiogram [ECG] [EKG]: Secondary | ICD-10-CM | POA: Diagnosis not present

## 2021-03-19 DIAGNOSIS — Z79890 Hormone replacement therapy (postmenopausal): Secondary | ICD-10-CM | POA: Diagnosis not present

## 2021-03-19 DIAGNOSIS — F05 Delirium due to known physiological condition: Secondary | ICD-10-CM | POA: Diagnosis present

## 2021-03-19 DIAGNOSIS — I4891 Unspecified atrial fibrillation: Secondary | ICD-10-CM | POA: Diagnosis not present

## 2021-03-19 DIAGNOSIS — F0280 Dementia in other diseases classified elsewhere without behavioral disturbance: Secondary | ICD-10-CM | POA: Diagnosis present

## 2021-03-19 DIAGNOSIS — Z7984 Long term (current) use of oral hypoglycemic drugs: Secondary | ICD-10-CM | POA: Diagnosis not present

## 2021-03-19 DIAGNOSIS — G20 Parkinson's disease: Secondary | ICD-10-CM | POA: Diagnosis present

## 2021-03-19 DIAGNOSIS — I4892 Unspecified atrial flutter: Secondary | ICD-10-CM | POA: Diagnosis present

## 2021-03-19 DIAGNOSIS — G9341 Metabolic encephalopathy: Secondary | ICD-10-CM | POA: Diagnosis present

## 2021-03-19 DIAGNOSIS — Z20822 Contact with and (suspected) exposure to covid-19: Secondary | ICD-10-CM | POA: Diagnosis present

## 2021-03-19 DIAGNOSIS — E871 Hypo-osmolality and hyponatremia: Secondary | ICD-10-CM | POA: Diagnosis present

## 2021-03-19 DIAGNOSIS — E119 Type 2 diabetes mellitus without complications: Secondary | ICD-10-CM | POA: Diagnosis present

## 2021-03-19 DIAGNOSIS — Z781 Physical restraint status: Secondary | ICD-10-CM | POA: Diagnosis not present

## 2021-03-19 DIAGNOSIS — I482 Chronic atrial fibrillation, unspecified: Secondary | ICD-10-CM | POA: Diagnosis not present

## 2021-03-19 DIAGNOSIS — E039 Hypothyroidism, unspecified: Secondary | ICD-10-CM | POA: Diagnosis present

## 2021-03-19 DIAGNOSIS — Z9181 History of falling: Secondary | ICD-10-CM | POA: Diagnosis not present

## 2021-03-19 DIAGNOSIS — Z79899 Other long term (current) drug therapy: Secondary | ICD-10-CM | POA: Diagnosis not present

## 2021-03-19 DIAGNOSIS — G40909 Epilepsy, unspecified, not intractable, without status epilepticus: Secondary | ICD-10-CM | POA: Diagnosis present

## 2021-03-19 DIAGNOSIS — R569 Unspecified convulsions: Secondary | ICD-10-CM | POA: Diagnosis present

## 2021-03-19 DIAGNOSIS — K219 Gastro-esophageal reflux disease without esophagitis: Secondary | ICD-10-CM | POA: Diagnosis present

## 2021-03-19 DIAGNOSIS — I10 Essential (primary) hypertension: Secondary | ICD-10-CM | POA: Diagnosis present

## 2021-03-19 LAB — COMPREHENSIVE METABOLIC PANEL
ALT: 8 U/L (ref 0–44)
AST: 16 U/L (ref 15–41)
Albumin: 4 g/dL (ref 3.5–5.0)
Alkaline Phosphatase: 82 U/L (ref 38–126)
Anion gap: 12 (ref 5–15)
BUN: 15 mg/dL (ref 8–23)
CO2: 27 mmol/L (ref 22–32)
Calcium: 9.5 mg/dL (ref 8.9–10.3)
Chloride: 96 mmol/L — ABNORMAL LOW (ref 98–111)
Creatinine, Ser: 0.72 mg/dL (ref 0.61–1.24)
GFR, Estimated: >60 mL/min (ref >60)
Glucose, Bld: 110 mg/dL — ABNORMAL HIGH (ref 70–99)
Potassium: 4.4 mmol/L (ref 3.5–5.1)
Sodium: 135 mmol/L (ref 135–145)
Total Bilirubin: 0.9 mg/dL (ref 0.3–1.2)
Total Protein: 7.7 g/dL (ref 6.5–8.1)

## 2021-03-19 LAB — CBC
HCT: 46.5 % (ref 39.0–52.0)
Hemoglobin: 15.3 g/dL (ref 13.0–17.0)
MCH: 29 pg (ref 26.0–34.0)
MCHC: 32.9 g/dL (ref 30.0–36.0)
MCV: 88.1 fL (ref 80.0–100.0)
Platelets: 270 10*3/uL (ref 150–400)
RBC: 5.28 MIL/uL (ref 4.22–5.81)
RDW: 12.7 % (ref 11.5–15.5)
WBC: 9.7 10*3/uL (ref 4.0–10.5)
nRBC: 0 % (ref 0.0–0.2)

## 2021-03-19 LAB — GLUCOSE, CAPILLARY
Glucose-Capillary: 102 mg/dL — ABNORMAL HIGH (ref 70–99)
Glucose-Capillary: 98 mg/dL (ref 70–99)
Glucose-Capillary: 124 mg/dL — ABNORMAL HIGH (ref 70–99)

## 2021-03-19 MED ORDER — SODIUM CHLORIDE 0.9 % IV SOLN
150.0000 mg | Freq: Two times a day (BID) | INTRAVENOUS | Status: DC
  Administered 2021-03-19 – 2021-03-24 (×11): 150 mg via INTRAVENOUS
  Filled 2021-03-19 (×11): qty 15

## 2021-03-19 MED ORDER — LORAZEPAM 2 MG/ML IJ SOLN
0.5000 mg | Freq: Once | INTRAMUSCULAR | Status: AC
  Administered 2021-03-19: 0.5 mg via INTRAVENOUS
  Filled 2021-03-19: qty 1

## 2021-03-19 MED ORDER — LEVETIRACETAM IN NACL 1500 MG/100ML IV SOLN
1500.0000 mg | Freq: Two times a day (BID) | INTRAVENOUS | Status: DC
  Administered 2021-03-19 – 2021-03-23 (×10): 1500 mg via INTRAVENOUS
  Filled 2021-03-19 (×12): qty 100
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
## 2021-03-19 NOTE — Evaluation (Signed)
Physical Therapy Evaluation Patient Details Name: Gerald Leach MRN: [id number] DOB: March 04, 1934 Today's Date: 03/19/2021  History of Present Illness  Pt admitted from home following fall with possible seizure as witnessed by family.  Pt was to dc from ED but experinced another seizure and was admitted.  Pt with hx of a-fib, Parkinsons, Dementia, Brain aneurysm with craniotomy (14)T7 vertebral fx s/p fall (13); C7 compression fx, and multiple falls  Clinical Impression  Pt admitted as above and presenting with functional mobility limitations 2* generalized weakness, ambulatory balance deficits with decreased L LE WB tolerance, and poor insight into deficits with poor safety awareness.  Pt is at high risk for further falls and would benefit from follow up rehab at SNF level to maximize IND and safety unless 24/7 assist is available at home - then HHPT would be beneficial to further address deficits.     Recommendations for follow up therapy are one component of a multi-disciplinary discharge planning process, led by the attending physician.  Recommendations may be updated based on patient status, additional functional criteria and insurance authorization.  Follow Up Recommendations SNF    Equipment Recommendations  None recommended by PT    Recommendations for Other Services       Precautions / Restrictions Precautions Precautions: Fall Restrictions Weight Bearing Restrictions: No      Mobility  Bed Mobility               General bed mobility comments: Pt sitting on EOB on arrival to room, per nursing had placed himself there    Transfers Overall transfer level: Needs assistance Equipment used: None Transfers: Sit to/from Stand Sit to Stand: Min assist         General transfer comment: widened BOS, physical assist to bring wt up and fwd and to balance in standing  Ambulation/Gait Ambulation/Gait assistance: Min assist Gait Distance (Feet): 170 Feet Assistive  device: Rolling walker (2 wheeled) Gait Pattern/deviations: Step-to pattern;Step-through pattern;Decreased step length - right;Decreased step length - left;Decreased stance time - left;Shuffle;Trunk flexed;Wide base of support     General Gait Details: Pt very unstable with attempt to step sans AD and reaching for RW.  Improvement in stability with RW but pt continues to require assist for balance/support and RW management.  Pt with flexed posture, decreased stance time tolrated on L LE, widened BOS and frequent cueing for posture, position from RW, sequence and basic safety awareness. Multiple potential falls prevented by staff assist.  Stairs            Wheelchair Mobility    Modified Rankin (Stroke Patients Only)       Balance Overall balance assessment: Needs assistance Sitting-balance support: No upper extremity supported;Feet supported Sitting balance-Leahy Scale: Good     Standing balance support: No upper extremity supported Standing balance-Leahy Scale: Poor                               Pertinent Vitals/Pain Pain Assessment: Faces Faces Pain Scale: Hurts little more Pain Location: L hip with mobility Pain Descriptors / Indicators: Grimacing;Guarding Pain Intervention(s): Limited activity within patient's tolerance;Monitored during session    Home Living Family/patient expects to be discharged to:: Private residence Living Arrangements: Alone Available Help at Discharge: Available PRN/intermittently Type of Home: House Home Access: Stairs to enter   [employer] of Steps: 3-4 Home Layout: Multi-level;Able to live on main level with bedroom/bathroom Home Equipment: Walker - 4 wheels;Grab  bars - tub/shower;Cane - single point      Prior Function Level of Independence: Independent with assistive device(s)         Comments: Pt states uses cane or furniture to get around the house and rollator outside     Hand Dominance   Dominant  Hand: Right    Extremity/Trunk Assessment   Upper Extremity Assessment Upper Extremity Assessment: Defer to OT evaluation    Lower Extremity Assessment Lower Extremity Assessment: Generalized weakness;LLE deficits/detail LLE Deficits / Details: Pt reports hip discomfort but only with ambulation    Cervical / Trunk Assessment Cervical / Trunk Assessment: Kyphotic  Communication   Communication: HOH  Cognition Arousal/Alertness: Awake/alert Behavior During Therapy: Impulsive;Restless Overall Cognitive Status: History of cognitive impairments - at baseline                                        General Comments      Exercises     Assessment/Plan    PT Assessment Patient needs continued PT services  PT Problem List Decreased strength;Decreased activity tolerance;Decreased balance;Decreased mobility;Decreased cognition;Decreased knowledge of use of DME;Decreased safety awareness;Pain       PT Treatment Interventions DME instruction;Gait training;Stair training;Functional mobility training;Therapeutic activities;Therapeutic exercise;Balance training;Cognitive remediation;Patient/family education    PT Goals (Current goals can be found in the Care Plan section)  Acute Rehab PT Goals Patient Stated Goal: Walk PT Goal Formulation: With patient Time For Goal Achievement: 04/02/21 Potential to Achieve Goals: Fair    Frequency Min 3X/week   Barriers to discharge Decreased caregiver support Pt is high fall risk and does not have 24/7 assist    Co-evaluation PT/OT/SLP Co-Evaluation/Treatment: Yes Reason for Co-Treatment: For patient/therapist safety;To address functional/ADL transfers;Necessary to address cognition/behavior during functional activity PT goals addressed during session: Mobility/safety with mobility OT goals addressed during session: ADL's and self-care       AM-PAC PT "6 Clicks" Mobility  Outcome Measure Help needed turning from your back  to your side while in a flat bed without using bedrails?: None Help needed moving from lying on your back to sitting on the side of a flat bed without using bedrails?: None Help needed moving to and from a bed to a chair (including a wheelchair)?: A Lot Help needed standing up from a chair using your arms (e.g., wheelchair or bedside chair)?: A Little Help needed to walk in hospital room?: A Lot Help needed climbing 3-5 steps with a railing? : A Lot 6 Click Score: 17    End of Session Equipment Utilized During Treatment: Gait belt Activity Tolerance: Patient tolerated treatment well;Patient limited by fatigue Patient left: in chair;with call bell/phone within reach;with chair alarm set Nurse Communication: Mobility status PT Visit Diagnosis: Unsteadiness on feet (R26.81);Muscle weakness (generalized) (M62.81);Difficulty in walking, not elsewhere classified (R26.2);Pain;History of falling (Z91.81) Pain - Right/Left: Left Pain - part of body: Hip    Time: 5027-7412 PT Time Calculation (min) (ACUTE ONLY): 25 min   Charges:   PT Evaluation $PT Eval Low Complexity: 1 Low          [place] Pager [phone] Office [phone]   Danyia Borunda 03/19/2021, 4:29 PM

## 2021-03-19 NOTE — Evaluation (Signed)
Occupational Therapy Evaluation Patient Details Name: Gerald Leach MRN: [id number] DOB: 1934-02-12 Today's Date: 03/19/2021   History of Present Illness Patient is a 87 year old male who was admitted from home following fall with possible seizure as witnessed by family.  Pt was to dc from ED but experinced another seizure and was admitted.  Pt with hx of a-fib, Parkinsons, Dementia, Brain aneurysm with craniotomy (14)T7 vertebral fx s/p fall (13); C7 compression fx, and multiple falls   Clinical Impression   Patient is a 87 year old male who was admitted for above. Patient was living at home alone prior level. Currently, patient is +2 for safety for functional mobility with RW level with mod/max A for ADL tasks. Patient will need 24/7 care in next level of care. Patient was noted to have poor safety awareness, decreased functional activity tolerance, decreased endurance, increased pain in LLE and decreased standing balance impacting ability to engage in ADLs. Patient would continue to benefit from skilled OT services at this time while admitted and after d/c to address noted deficits in order to improve overall safety and independence in ADLs.        Recommendations for follow up therapy are one component of a multi-disciplinary discharge planning process, led by the attending physician.  Recommendations may be updated based on patient status, additional functional criteria and insurance authorization.   Follow Up Recommendations  SNF;Supervision/Assistance - 24 hour;Home health OT    Equipment Recommendations  Other (comment)    Recommendations for Other Services       Precautions / Restrictions Precautions Precautions: Fall Precaution Comments: seizures Restrictions Weight Bearing Restrictions: No      Mobility Bed Mobility               General bed mobility comments: Pt sitting on EOB on arrival to room, per nursing had placed himself there    Transfers Overall  transfer level: Needs assistance Equipment used: None Transfers: Sit to/from Stand Sit to Stand: Min assist         General transfer comment: widened BOS, physical assist to bring wt up and fwd and to balance in standing. patient reported not using AD at home but was reaching out for RW that was in room with inability to advance RLE with self limitng WB on LLE.    Balance Overall balance assessment: Needs assistance Sitting-balance support: No upper extremity supported;Feet supported Sitting balance-Leahy Scale: Good     Standing balance support: No upper extremity supported Standing balance-Leahy Scale: Poor                             ADL either performed or assessed with clinical judgement   ADL Overall ADL's : Needs assistance/impaired Eating/Feeding: Set up;Supervision/ safety;Sitting Eating/Feeding Details (indicate cue type and reason): patient was noted to have food on mouth at start of session. Grooming: Sitting;Wash/dry face;Wash/dry hands;Supervision/safety;Min guard   Upper Body Bathing: Minimal assistance;Sitting   Lower Body Bathing: Moderate assistance;Sitting/lateral leans   Upper Body Dressing : Moderate assistance;Sitting   Lower Body Dressing: Sitting/lateral leans;Moderate assistance Lower Body Dressing Details (indicate cue type and reason): patient was noted to have problem sequencing tasks for donning/doffing socks when asked. patient when provided sock not on foot, attempted to roll up sock prior to donning it. patient then placed rolled sock on foot with elastic band of sock stuck around toes. patient was unable to problem solve on how to fix this  issue without physical assistance. Toilet Transfer: +2 for safety/equipment;+2 for physical assistance;Minimal assistance;RW Toilet Transfer Details (indicate cue type and reason): patient was unable to weight shift to LLE with increased pain patient had noted self limitnig WB on that LE. patient  declined to use bathroom at this time. patient was positioned in relciner in room at end of session. Toileting- Clothing Manipulation and Hygiene: Maximal assistance;Sit to/from stand       Functional mobility during ADLs: Rolling walker;Cueing for sequencing;Cueing for safety;+2 for safety/equipment;+2 for physical assistance;Minimal assistance General ADL Comments: patient needed physical assist to manage walker with movemetns especially with turns where patietn attemped to push walker away from him.     Vision   Additional Comments: unable to assess with patient unable to follow commands for testing     Perception     Praxis      Pertinent Vitals/Pain Pain Assessment: Faces Faces Pain Scale: Hurts little more Pain Location: L hip with mobility Pain Descriptors / Indicators: Grimacing;Guarding Pain Intervention(s): Limited activity within patient's tolerance;Monitored during session     Hand Dominance Right   Extremity/Trunk Assessment Upper Extremity Assessment Upper Extremity Assessment: Generalized weakness   Lower Extremity Assessment Lower Extremity Assessment: Defer to PT evaluation LLE Deficits / Details: Pt reports hip discomfort but only with ambulation   Cervical / Trunk Assessment Cervical / Trunk Assessment: Kyphotic   Communication Communication Communication: HOH   Cognition Arousal/Alertness: Awake/alert Behavior During Therapy: Impulsive;Restless Overall Cognitive Status: History of cognitive impairments - at baseline                                 General Comments: patient struggled with attempts to answer questions reguarding PLOF and to attend to question asked   General Comments       Exercises     Shoulder Instructions      Home Living Family/patient expects to be discharged to:: Private residence Living Arrangements: Alone Available Help at Discharge: Available PRN/intermittently Type of Home: House Home Access: Stairs  to enter [employer] of Steps: 3-4   Home Layout: Multi-level;Able to live on main level with bedroom/bathroom Alternate Level Stairs-Number of Steps: 12 Alternate Level Stairs-Rails: Right Bathroom Shower/Tub: Tub/shower unit   Bathroom Toilet: Standard     Home Equipment: Walker - 4 wheels;Grab bars - tub/shower;Cane - single point          Prior Functioning/Environment Level of Independence: Independent with assistive device(s)        Comments: Pt states uses cane or furniture to get around the house and rollator outside        OT Problem List: Decreased strength;Decreased range of motion;Impaired balance (sitting and/or standing);Decreased activity tolerance;Decreased safety awareness;Decreased knowledge of use of DME or AE;Decreased cognition      OT Treatment/Interventions: Self-care/ADL training;Therapeutic exercise;Energy conservation;DME and/or AE instruction;Therapeutic activities;Balance training;Patient/family education    OT Goals(Current goals can be found in the care plan section) Acute Rehab OT Goals Patient Stated Goal: to walk OT Goal Formulation: With patient Time For Goal Achievement: 04/02/21 Potential to Achieve Goals: Fair  OT Frequency: Min 2X/week   Barriers to D/C: Decreased caregiver support  patient lives at home alone       Co-evaluation   Reason for Co-Treatment: For patient/therapist safety;To address functional/ADL transfers;Necessary to address cognition/behavior during functional activity PT goals addressed during session: Mobility/safety with mobility OT goals addressed during session: ADL's and self-care  AM-PAC OT "6 Clicks" Daily Activity     Outcome Measure Help from another person eating meals?: A Little Help from another person taking care of personal grooming?: A Little Help from another person toileting, which includes using toliet, bedpan, or urinal?: A Lot Help from another person bathing (including  washing, rinsing, drying)?: A Lot Help from another person to put on and taking off regular upper body clothing?: A Lot Help from another person to put on and taking off regular lower body clothing?: A Lot 6 Click Score: 14   End of Session Equipment Utilized During Treatment: Gait belt;Rolling walker Nurse Communication: Mobility status  Activity Tolerance: Patient tolerated treatment well Patient left: in chair;with call bell/phone within reach;with chair alarm set  OT Visit Diagnosis: Muscle weakness (generalized) (M62.81);Pain;History of falling (Z91.81);Unsteadiness on feet (R26.81) Pain - Right/Left: Left Pain - part of body: Ankle and joints of foot;Leg                Time: 7078-6754 OT Time Calculation (min): 23 min Charges:  OT General Charges $OT Visit: 1 Visit OT Evaluation $OT Eval Moderate Complexity: 1 Mod  Jackelyn Poling OTR/L, MS Acute Rehabilitation Department Office# [phone] Pager# [phone]   [place] 03/19/2021, 4:58 PM

## 2021-03-19 NOTE — Progress Notes (Signed)
Pt refused vital signs at this time.  Will try again when patient is more agreeable.

## 2021-03-19 NOTE — Progress Notes (Signed)
PROGRESS NOTE  Gerald Leach:[id number] DOB: 12/12/1933 DOA: 03/17/2021 PCP: [name], NP   LOS: 0 days   Brief Narrative / Interim history: 87 year old male with history of seizure disorder, hypertension, hypothyroidism, dementia comes to the hospital with metabolic encephalopathy.  Apparently had a witnessed seizure and was seen by the family, fell to the ground and hit his head.  The seizure lasted less than couple of minutes and he was brought to the emergency room.  In the ED plan was to increase Vimpat and having go back home and follow-up with neurology but was unable to be worked up by family due to the weather, and while in the ED had a second seizure and that he was admitted to the hospital.  Subjective / 24h Interval events: 52 this morning, confused, restraints in place.  He is upset with me asking any questions, states that he is at home and has no idea what I am doing here  Assessment & Plan: Principal Problem Breakthrough seizures -At home patient is on Vimpat 100 mg twice daily and Keppra 1500 mg twice daily, neurology consulted in the ED and recommended to increase Vimpat to 150 twice daily. -Unfortunately patient is refusing p.o. intake in the setting of his delirium, continue intravenous agents for now  Active Problems Acute metabolic encephalopathy, underlying Parkinson's with dementia -patient with worsening encephalopathy, likely in hospital delirium with his underlying dementia. -Continue supportive treatment, continue home medications -Following 2 seizures he is extremely deconditioned today, he worked with physical therapy recommending rehab to which family is in agreement  Hypothyroidism - continue synthroid   DM2- on metformin at home, last A1c in Feb was 5.6  CBG (last 3)  Recent Labs    03/18/21 1709 03/19/21 0731 03/19/21 1423  GLUCAP 83 102* 98    GERD - PPI  Scheduled Meds:  amantadine  100 mg Oral Daily   carbidopa-levodopa  1 tablet  Oral TID   insulin aspart  0-6 Units Subcutaneous TID WC   levothyroxine  75 mcg Oral Q0600   pantoprazole  40 mg Oral Daily   vitamin B-12  1,000 mcg Oral Daily   Continuous Infusions:  lacosamide (VIMPAT) IV 150 mg (03/19/21 1325)   levETIRAcetam 1,500 mg (03/19/21 1221)   PRN Meds:.acetaminophen **OR** acetaminophen, ondansetron **OR** ondansetron (ZOFRAN) IV  Diet Orders (From admission, onward)     Start     Ordered   03/19/21 0747  Diet Carb Modified Fluid consistency: Thin; Room service appropriate? No  Diet effective now       Question Answer Comment  Diet-HS Snack? Nothing   Calorie Level Medium 1600-2000   Fluid consistency: Thin   Room service appropriate? No      03/19/21 0746            DVT prophylaxis: SCDs Start: 03/18/21 4196     Code Status: Full Code  Family Communication: Discussed with daughter over the phone  Status is: Inpatient  Remains inpatient appropriate because:Altered mental status and Inpatient level of care appropriate due to severity of illness  Dispo: The patient is from: Home              Anticipated d/c is to: SNF              Patient currently is not medically stable to d/c.   Difficult to place patient No   Level of care: Telemetry  Consultants:  none  Procedures:  none  Microbiology  none  Antimicrobials:  none    Objective: Vitals:   03/19/21 0313 03/19/21 0359 03/19/21 0436 03/19/21 0600  BP: (!) 158/103 (!) 178/112  134/89  Pulse: 83 95  (!) 58  Resp: 14 16 16    Temp:      TempSrc:      SpO2:  100%    Weight:      Height:       No intake or output data in the 24 hours ending 03/19/21 1434 Filed Weights   03/17/21 1642 03/18/21 1700  Weight: 72.6 kg 68.5 kg    Examination:  Constitutional: No apparent distress, fidgety, Eyes: no scleral icterus ENMT: Mucous membranes are moist.  Neck: normal, supple Respiratory: clear to auscultation bilaterally, no wheezing, no crackles. Normal respiratory  effort. No accessory muscle use.  Cardiovascular: Regular rate and rhythm, no murmurs / rubs / gallops. No LE edema.  Abdomen: non distended, no tenderness. Bowel sounds positive.  Musculoskeletal: no clubbing / cyanosis.  Skin: no rashes Neurologic: Does not follow commands consistently but nonfocal   Data Reviewed: I have independently reviewed following labs and imaging studies   CBC: Recent Labs  Lab 03/17/21 1755 03/18/21 1242 03/19/21 0355  WBC 9.9 7.6 9.7  NEUTROABS 6.7 4.9  --   HGB 15.0 14.6 15.3  HCT 45.7 44.4 46.5  MCV 90.0 89.2 88.1  PLT 262 277 638   Basic Metabolic Panel: Recent Labs  Lab 03/17/21 1755 03/18/21 1242 03/19/21 0355  NA 138 136 135  K 4.4 3.9 4.4  CL 98 98 96*  CO2 27 28 27   GLUCOSE 90 114* 110*  BUN 14 13 15   CREATININE 0.75 0.63 0.72  CALCIUM 9.4 8.8* 9.5   Liver Function Tests: Recent Labs  Lab 03/17/21 1755 03/18/21 1242 03/19/21 0355  AST 14* 16 16  ALT 9 11 8   ALKPHOS 85 81 82  BILITOT 0.8 1.1 0.9  PROT 8.1 7.4 7.7  ALBUMIN 4.2 3.7 4.0   Coagulation Profile: No results for input(s): INR, PROTIME in the last 168 hours. HbA1C: Recent Labs    03/18/21 1242  HGBA1C 5.4   CBG: Recent Labs  Lab 03/18/21 1238 03/18/21 1709 03/19/21 0731 03/19/21 1423  GLUCAP 99 83 102* 98    Recent Results (from the past 240 hour(s))  SARS CORONAVIRUS 2 (TAT 6-24 HRS) Nasopharyngeal Nasopharyngeal Swab     Status: None   Collection Time: 03/18/21  6:16 AM   Specimen: Nasopharyngeal Swab  Result Value Ref Range Status   SARS Coronavirus 2 NEGATIVE NEGATIVE Final    Comment: (NOTE) SARS-CoV-2 target nucleic acids are NOT DETECTED.  The SARS-CoV-2 RNA is generally detectable in upper and lower respiratory specimens during the acute phase of infection. Negative results do not preclude SARS-CoV-2 infection, do not rule out co-infections with other pathogens, and should not be used as the sole basis for treatment or other patient  management decisions. Negative results must be combined with clinical observations, patient history, and epidemiological information. The expected result is Negative.  Fact Sheet for Patients: SugarRoll.be  Fact Sheet for Healthcare Providers: https://www.woods-mathews.com/  This test is not yet approved or cleared by the [place] FDA and  has been authorized for detection and/or diagnosis of SARS-CoV-2 by FDA under an Emergency Use Authorization (EUA). This EUA will remain  in effect (meaning this test can be used) for the duration of the COVID-19 declaration under Se ction 564(b)(1) of the Act, 21 U.S.C. section 360bbb-3(b)(1), unless the authorization is terminated or revoked  sooner.  Performed at [facility], [place] [address], [place], [place] [address]      Radiology Studies: No results found.  Marzetta Board, MD, PhD [facility]  Between 7 am - 7 pm I am available, please contact me via Amion (for emergencies) or Securechat (non urgent messages)  Between 7 pm - 7 am I am not available, please contact night coverage MD/APP via Amion

## 2021-03-20 ENCOUNTER — Telehealth: Payer: Medicare Other

## 2021-03-20 LAB — GLUCOSE, CAPILLARY
Glucose-Capillary: 125 mg/dL — ABNORMAL HIGH (ref 70–99)
Glucose-Capillary: 99 mg/dL (ref 70–99)
Glucose-Capillary: 170 mg/dL — ABNORMAL HIGH (ref 70–99)
Glucose-Capillary: 144 mg/dL — ABNORMAL HIGH (ref 70–99)

## 2021-03-20 MED ORDER — QUETIAPINE FUMARATE 25 MG PO TABS
12.5000 mg | ORAL_TABLET | Freq: Every day | ORAL | Status: DC
  Administered 2021-03-21 – 2021-03-24 (×4): 12.5 mg via ORAL
  Filled 2021-03-20 (×5): qty 1

## 2021-03-20 NOTE — Progress Notes (Addendum)
PROGRESS NOTE  Gerald Leach QMG:[id number] DOB: 1933-11-08 DOA: 03/17/2021 PCP: [name], NP   LOS: 1 day   Brief Narrative / Interim history: 87 year old male with history of seizure disorder, hypertension, hypothyroidism, dementia comes to the hospital with metabolic encephalopathy.  Apparently had a witnessed seizure and was seen by the family, fell to the ground and hit his head.  The seizure lasted less than couple of minutes and he was brought to the emergency room.  In the ED plan was to increase Vimpat and having go back home and follow-up with neurology but was unable to be worked up by family due to the weather, and while in the ED had a second seizure and that he was admitted to the hospital.  Subjective / 24h Interval events: Remains confused this morning, still has restraints  Assessment & Plan: Principal Problem Breakthrough seizures -At home patient is on Vimpat 100 mg twice daily and Keppra 1500 mg twice daily, neurology consulted in the ED and recommended to increase Vimpat to 150 twice daily. -Unfortunately patient is refusing p.o. intake in the setting of his delirium, continue IV agents for now  Active Problems Acute metabolic encephalopathy, underlying Parkinson's with dementia -patient with worsening encephalopathy, likely in hospital delirium with his underlying dementia. -Continue supportive treatment, continue home medications -Following 2 seizures he is extremely deconditioned, he worked with physical therapy recommending rehab to which family is in agreement -He is stable to be transferred to SNF however remains in restraints  Hypothyroidism - continue synthroid   DM2- on metformin at home, last A1c in Feb was 5.6  CBG (last 3)  Recent Labs    03/19/21 2038 03/20/21 0740 03/20/21 1151  GLUCAP 124* 125* 99     GERD - PPI  Scheduled Meds:  amantadine  100 mg Oral Daily   carbidopa-levodopa  1 tablet Oral TID   insulin aspart  0-6 Units  Subcutaneous TID WC   levothyroxine  75 mcg Oral Q0600   pantoprazole  40 mg Oral Daily   vitamin B-12  1,000 mcg Oral Daily   Continuous Infusions:  lacosamide (VIMPAT) IV 150 mg (03/20/21 0946)   levETIRAcetam 1,500 mg (03/20/21 0754)   PRN Meds:.acetaminophen **OR** acetaminophen, ondansetron **OR** ondansetron (ZOFRAN) IV  Diet Orders (From admission, onward)     Start     Ordered   03/19/21 0747  Diet Carb Modified Fluid consistency: Thin; Room service appropriate? No  Diet effective now       Question Answer Comment  Diet-HS Snack? Nothing   Calorie Level Medium 1600-2000   Fluid consistency: Thin   Room service appropriate? No      03/19/21 0746            DVT prophylaxis: SCDs Start: 03/18/21 3267     Code Status: Full Code  Family Communication: Discussed with daughter over the phone  Status is: Inpatient  Remains inpatient appropriate because:Altered mental status and Inpatient level of care appropriate due to severity of illness  Dispo: The patient is from: Home              Anticipated d/c is to: SNF              Patient currently is not medically stable to d/c.   Difficult to place patient No   Level of care: Telemetry  Consultants:  none  Procedures:  none  Microbiology  none  Antimicrobials: none    Objective: Vitals:   03/19/21 0600 03/19/21 1438  03/19/21 2041 03/20/21 0451  BP: 134/89 138/88 (!) 160/96 (!) 161/85  Pulse: (!) 58 (!) 52 76 71  Resp:  17 16 14   Temp:   97.7 F (36.5 C) (!) 97.5 F (36.4 C)  TempSrc:      SpO2:  98% 98% 92%  Weight:      Height:        Intake/Output Summary (Last 24 hours) at 03/20/2021 1204 Last data filed at 03/20/2021 0456 Gross per 24 hour  Intake 392.23 ml  Output 850 ml  Net -457.77 ml   Filed Weights   03/17/21 1642 03/18/21 1700  Weight: 72.6 kg 68.5 kg    Examination:  Constitutional: No distress, confused Eyes: Anicteric ENMT: mmm Neck: normal, supple Respiratory: Clear  bilaterally, no wheezing, no crackles Cardiovascular: Regular rate and rhythm, no murmurs, no edema Abdomen: Soft, NT, ND, bowel sounds positive Musculoskeletal: no clubbing / cyanosis.  Skin: No rashes appreciated Neurologic: Nonfocal   Data Reviewed: I have independently reviewed following labs and imaging studies   CBC: Recent Labs  Lab 03/17/21 1755 03/18/21 1242 03/19/21 0355  WBC 9.9 7.6 9.7  NEUTROABS 6.7 4.9  --   HGB 15.0 14.6 15.3  HCT 45.7 44.4 46.5  MCV 90.0 89.2 88.1  PLT 262 277 174    Basic Metabolic Panel: Recent Labs  Lab 03/17/21 1755 03/18/21 1242 03/19/21 0355  NA 138 136 135  K 4.4 3.9 4.4  CL 98 98 96*  CO2 27 28 27   GLUCOSE 90 114* 110*  BUN 14 13 15   CREATININE 0.75 0.63 0.72  CALCIUM 9.4 8.8* 9.5    Liver Function Tests: Recent Labs  Lab 03/17/21 1755 03/18/21 1242 03/19/21 0355  AST 14* 16 16  ALT 9 11 8   ALKPHOS 85 81 82  BILITOT 0.8 1.1 0.9  PROT 8.1 7.4 7.7  ALBUMIN 4.2 3.7 4.0    Coagulation Profile: No results for input(s): INR, PROTIME in the last 168 hours. HbA1C: Recent Labs    03/18/21 1242  HGBA1C 5.4    CBG: Recent Labs  Lab 03/19/21 0731 03/19/21 1423 03/19/21 2038 03/20/21 0740 03/20/21 1151  GLUCAP 102* 98 124* 125* 99     Recent Results (from the past 240 hour(s))  SARS CORONAVIRUS 2 (TAT 6-24 HRS) Nasopharyngeal Nasopharyngeal Swab     Status: None   Collection Time: 03/18/21  6:16 AM   Specimen: Nasopharyngeal Swab  Result Value Ref Range Status   SARS Coronavirus 2 NEGATIVE NEGATIVE Final    Comment: (NOTE) SARS-CoV-2 target nucleic acids are NOT DETECTED.  The SARS-CoV-2 RNA is generally detectable in upper and lower respiratory specimens during the acute phase of infection. Negative results do not preclude SARS-CoV-2 infection, do not rule out co-infections with other pathogens, and should not be used as the sole basis for treatment or other patient management decisions. Negative  results must be combined with clinical observations, patient history, and epidemiological information. The expected result is Negative.  Fact Sheet for Patients: SugarRoll.be  Fact Sheet for Healthcare Providers: https://www.woods-mathews.com/  This test is not yet approved or cleared by the [place] FDA and  has been authorized for detection and/or diagnosis of SARS-CoV-2 by FDA under an Emergency Use Authorization (EUA). This EUA will remain  in effect (meaning this test can be used) for the duration of the COVID-19 declaration under Se ction 564(b)(1) of the Act, 21 U.S.C. section 360bbb-3(b)(1), unless the authorization is terminated or revoked sooner.  Performed at [facility]  [facility], [place] [address], [place], [place] [address]       Radiology Studies: No results found.  Marzetta Board, MD, PhD [facility]  Between 7 am - 7 pm I am available, please contact me via Amion (for emergencies) or Securechat (non urgent messages)  Between 7 pm - 7 am I am not available, please contact night coverage MD/APP via Amion

## 2021-03-21 LAB — GLUCOSE, CAPILLARY
Glucose-Capillary: 84 mg/dL (ref 70–99)
Glucose-Capillary: 136 mg/dL — ABNORMAL HIGH (ref 70–99)
Glucose-Capillary: 101 mg/dL — ABNORMAL HIGH (ref 70–99)
Glucose-Capillary: 113 mg/dL — ABNORMAL HIGH (ref 70–99)
Glucose-Capillary: 142 mg/dL — ABNORMAL HIGH (ref 70–99)

## 2021-03-21 NOTE — Progress Notes (Signed)
Patient refused vital signs 

## 2021-03-21 NOTE — Plan of Care (Signed)
  Problem: Clinical Measurements: Goal: Ability to maintain clinical measurements within normal limits will improve Outcome: Progressing Goal: Will remain free from infection Outcome: Progressing Goal: Diagnostic test results will improve Outcome: Progressing Goal: Respiratory complications will improve Outcome: Progressing Goal: Cardiovascular complication will be avoided Outcome: Progressing   Problem: Activity: Goal: Risk for activity intolerance will decrease Outcome: Progressing   Problem: Nutrition: Goal: Adequate nutrition will be maintained Outcome: Progressing   Problem: Coping: Goal: Level of anxiety will decrease Outcome: Progressing   Problem: Elimination: Goal: Will not experience complications related to urinary retention Outcome: Progressing   Problem: Pain Managment: Goal: General experience of comfort will improve Outcome: Progressing   Problem: Safety: Goal: Ability to remain free from injury will improve Outcome: Progressing   Problem: Skin Integrity: Goal: Risk for impaired skin integrity will decrease Outcome: Progressing   Problem: Education: Goal: Knowledge of General Education information will improve Description: Including pain rating scale, medication(s)/side effects and non-pharmacologic comfort measures Outcome: Not Progressing   Problem: Health Behavior/Discharge Planning: Goal: Ability to manage health-related needs will improve Outcome: Not Progressing   Problem: Elimination: Goal: Will not experience complications related to bowel motility Outcome: Not Progressing   Problem: Safety: Goal: Non-violent Restraint(s) Outcome: Completed/Met

## 2021-03-21 NOTE — Progress Notes (Addendum)
Notified on call provider about patient's heart rate getting down to the 30's/40's. Checked on patient. Patient was sleeping and resting comfortably. Woke patient up to check on patient, and patient stated he was tired. Patient's heart rate got back up to the 60's. Patient had refused morning vital signs. Will continue to monitor and pass along to day shift nurse.

## 2021-03-21 NOTE — TOC Progression Note (Addendum)
Transition of Care [facility]) - Progression Note    Patient Details  Name: Gerald Leach MRN: [id number] Date of Birth: 29-Mar-1934  Transition of Care [facility]) CM/SW Contact  Leeroy Cha, RN Phone Number: 03/21/2021, 9:08 AM  Clinical Narrative:    [place] faxed out to area snf's for review. Accepted by [facility] and [facility].  TCT son todd-will call back in approx 30 mins, Tct-todd chooses [facility] started with [employer] Expected Discharge Plan: [place] Barriers to Discharge: Continued Medical Work up  Expected Discharge Plan and Services Expected Discharge Plan: [place]   Discharge Planning Services: CM Consult   Living arrangements for the past 2 months: Single Family Home                                       Social Determinants of Health (SDOH) Interventions    Readmission Risk Interventions Readmission Risk Prevention Plan 08/12/2020  Post Dischage Appt Complete  Medication Screening Complete  Transportation Screening Complete  Some recent data might be hidden

## 2021-03-21 NOTE — NC FL2 (Signed)
[place] LEVEL OF CARE SCREENING TOOL     IDENTIFICATION  Patient Name: Gerald Leach Birthdate: 10-Jun-1934 Sex: male Admission Date (Current Location): 03/17/2021  [facility] and [place] Number:  Herbalist and Address:  [facility],  [place], [place]      Provider Number: [id number]  Attending Physician Name and Address:  Caren Griffins, MD  Relative Name and Phone Number:  todd Gubser [phone]    Current Level of Care: Hospital Recommended Level of Care: [place] Prior Approval Number:    Date Approved/Denied:   PASRR Number: [id number]  Discharge Plan: SNF    Current Diagnoses: Patient Active Problem List   Diagnosis Date Noted   Breakthrough seizure ([place]) 03/19/2021   Seizure ([place]) 03/18/2021   Dementia due to Parkinson'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 behavioral disturbance ([place]) 08/16/2020   Fall 08/11/2020   Closed compression fracture of L5 lumbar vertebra, initial encounter ([place]) 08/11/2020   Seizures ([place]) 03/05/2018   Leukocytosis 03/05/2018   Diabetic foot ulcers ([place]) 04/23/2017   Parkinson disease ([place])    Gait instability    Diabetes mellitus type 2, controlled ([place]) 08/05/2015   Hypothyroidism 08/05/2015   Fall at home 10/02/2014   CNS aneurysm s/p coiling ([facility], 2002) 10/02/2014   High grade T7 central canal stenosis with T7 vertebral fracture s/p fall (02/2012) 10/02/2014   Right clinoid calcified meningioma vs thrombosed giant ICA aneurysm, conservative management. 10/02/2014   Syncope 02/05/2014   Drug-induced delirium(292.81) 05/16/2013   Seizure disorder ([place]) 04/21/2013   Seizure disorder, grand mal ([place]) 03/24/2013   Cervical compression fracture---C7 10/29/2012   Dysphagia 09/21/2012   Subdural hematoma 09/20/2012   SOLAR KERATOSIS 02/17/2009   CARCINOMA, SKIN, SQUAMOUS CELL, FACE 04/19/2008   Hyperlipidemia, group D 01/27/2008   COLONIC POLYPS 11/15/2006   GERD 11/15/2006     Orientation RESPIRATION BLADDER Height & Weight     Self  Normal Continent Weight: 68.5 kg Height:  6' (182.9 cm)  BEHAVIORAL SYMPTOMS/MOOD NEUROLOGICAL BOWEL NUTRITION STATUS    Convulsions/Seizures (now on keppra and none since admission) Continent Diet (regular)  AMBULATORY STATUS COMMUNICATION OF NEEDS Skin   Extensive Assist Verbally Normal                       Personal Care Assistance Level of Assistance  Bathing, Feeding, Dressing Bathing Assistance: Limited assistance Feeding assistance: Limited assistance Dressing Assistance: Limited assistance     Functional Limitations Info  Sight, Hearing, Speech Sight Info: Adequate Hearing Info: Adequate Speech Info: Adequate    SPECIAL CARE FACTORS FREQUENCY  PT (By licensed PT), OT (By licensed OT)     PT Frequency: 5 x weekly OT Frequency: 5 x weekly            Contractures Contractures Info: Not present    Additional Factors Info  Code Status Code Status Info: full             Current Medications (03/21/2021):  This is the current hospital active medication list Current Facility-Administered Medications  Medication Dose Route Frequency Provider Last Rate Last Admin   acetaminophen (TYLENOL) tablet 650 mg  650 mg Oral Q6H PRN Marylyn Ishihara, Tyrone A, DO       Or   acetaminophen (TYLENOL) suppository 650 mg  650 mg Rectal Q6H PRN Marylyn Ishihara, Tyrone A, DO       amantadine (SYMMETREL) capsule 100 mg  100 mg Oral Daily Kyle, Tyrone A, DO  100 mg at 03/18/21 0956   carbidopa-levodopa (SINEMET IR) 25-100 MG per tablet immediate release 1 tablet  1 tablet Oral TID Cherylann Ratel A, DO   1 tablet at 03/18/21 1717   insulin aspart (novoLOG) injection 0-6 Units  0-6 Units Subcutaneous TID WC Kyle, Tyrone A, DO   1 Units at 03/20/21 1807   lacosamide (VIMPAT) 150 mg in sodium chloride 0.9 % 25 mL IVPB  150 mg Intravenous Q12H Caren Griffins, MD 80 mL/hr at 03/20/21 2218 150 mg at 03/20/21 2218   levETIRAcetam (KEPPRA) IVPB  1500 mg/ 100 mL premix  1,500 mg Intravenous Q12H Caren Griffins, MD 400 mL/hr at 03/20/21 2307 1,500 mg at 03/20/21 2307   levothyroxine (SYNTHROID) tablet 75 mcg  75 mcg Oral Q0600 Cherylann Ratel A, DO   75 mcg at 03/21/21 0610   ondansetron (ZOFRAN) tablet 4 mg  4 mg Oral Q6H PRN Cherylann Ratel A, DO       Or   ondansetron (ZOFRAN) injection 4 mg  4 mg Intravenous Q6H PRN Marylyn Ishihara, Tyrone A, DO       pantoprazole (PROTONIX) EC tablet 40 mg  40 mg Oral Daily Kyle, Tyrone A, DO   40 mg at 03/18/21 0932   QUEtiapine (SEROQUEL) tablet 12.5 mg  12.5 mg Oral QHS Caren Griffins, MD       vitamin B-12 (CYANOCOBALAMIN) tablet 1,000 mcg  1,000 mcg Oral Daily Kyle, Tyrone A, DO   1,000 mcg at 03/18/21 1457     Discharge Medications: Please see discharge summary for a list of discharge medications.  Relevant Imaging Results:  Relevant Lab Results:   Additional Information SSN [id number]  Leeroy Cha, RN

## 2021-03-21 NOTE — TOC Initial Note (Signed)
Transition of Care [facility]) - Initial/Assessment Note    Patient Details  Name: Gerald Leach MRN: [id number] Date of Birth: [birth year]  Transition of Care [facility]) CM/SW Contact:    Leeroy Cha, RN Phone Number: 03/21/2021, 8:59 AM  Clinical Narrative:                 87 y.o. male with medical history significant of seizure d/o, HTN, hypothyroidism, dementia. Presenting with AMS. History per family. Apparently the patient was walking outside yesterday and had a seizure witness by his family. He fell to the ground and hit his head. The seizure lasted less than a couple of minutes. He was seen in the ED. Family reported that he has been compliant with his medications. The plan was to increase his vimpat dosing and have him follow up with neurology. Apparently he was unable to be picked up by family yesterday due to weather. So, he was set up in the ED. Early this morning, he had another seizure. Neuro was consulted. Recommended vimpat 150mg  BID and Keppra 1500mg  BID TOC -SNF PLACEMENT STARTED ON [id number] Expected Discharge Plan: [place] Barriers to Discharge: Continued Medical Work up   Patient Goals and CMS Choice Patient states their goals for this hospitalization and ongoing recovery are:: unable to state CMS Medicare.gov Compare Post Acute Care list provided to:: Patient    Expected Discharge Plan and Services Expected Discharge Plan: [place]   Discharge Planning Services: CM Consult   Living arrangements for the past 2 months: Single Family Home                                      Prior Living Arrangements/Services Living arrangements for the past 2 months: Single Family Home Lives with:: Self Patient language and need for interpreter reviewed:: Yes Do you feel safe going back to the place where you live?:  (unable to state)      Need for Family Participation in Patient Care: Yes (Comment) (son) Care giver support system in place?:  Yes (comment) (son)   Criminal Activity/Legal Involvement Pertinent to Current Situation/Hospitalization: No - Comment as needed  Activities of Daily Living Home Assistive Devices/Equipment: Environmental consultant (specify type) ADL Screening (condition at time of admission) Patient's cognitive ability adequate to safely complete daily activities?: No Is the patient deaf or have difficulty hearing?: Yes Does the patient have difficulty seeing, even when wearing glasses/contacts?: No Does the patient have difficulty concentrating, remembering, or making decisions?: Yes Patient able to express need for assistance with ADLs?: Yes Does the patient have difficulty dressing or bathing?: Yes Independently performs ADLs?: No Does the patient have difficulty walking or climbing stairs?: Yes Weakness of Legs: Left Weakness of Arms/Hands: None  Permission Sought/Granted                  Emotional Assessment Appearance:: Appears stated age     Orientation: : Oriented to  Time, Oriented to Place Alcohol / Substance Use: Not Applicable Psych Involvement: No (comment)  Admission diagnosis:  Seizure ([place]) [R56.9] Breakthrough seizure ([place]) [G40.919] Patient Active Problem List   Diagnosis Date Noted   Breakthrough seizure ([place]) 03/19/2021   Seizure ([place]) 03/18/2021   Dementia due to Parkinson'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 behavioral disturbance ([place]) 08/16/2020   Fall 08/11/2020   Closed compression fracture of L5 lumbar vertebra, initial encounter ([place]) 08/11/2020   Seizures ([place]) 03/05/2018   Leukocytosis  03/05/2018   Diabetic foot ulcers ([place]) 04/23/2017   Parkinson disease ([place])    Gait instability    Diabetes mellitus type 2, controlled ([place]) 08/05/2015   Hypothyroidism 08/05/2015   Fall at home 10/02/2014   CNS aneurysm s/p coiling ([facility], 2002) 10/02/2014   High grade T7 central canal stenosis with T7 vertebral fracture s/p fall (02/2012) 10/02/2014   Right clinoid calcified meningioma vs thrombosed  giant ICA aneurysm, conservative management. 10/02/2014   Syncope 02/05/2014   Drug-induced delirium(292.81) 05/16/2013   Seizure disorder ([place]) 04/21/2013   Seizure disorder, grand mal ([place]) 03/24/2013   Cervical compression fracture---C7 10/29/2012   Dysphagia 09/21/2012   Subdural hematoma 09/20/2012   SOLAR KERATOSIS 02/17/2009   CARCINOMA, SKIN, SQUAMOUS CELL, FACE 04/19/2008   Hyperlipidemia, group D 01/27/2008   COLONIC POLYPS 11/15/2006   GERD 11/15/2006   PCP:  [name], NP Pharmacy:   [id number] - [place], [place] [address] [place] [address] Phone: [phone] Fax: [phone]  [id number] [place] [address] Phone: [phone] Fax: [phone]     Social Determinants of Health (SDOH) Interventions    Readmission Risk Interventions Readmission Risk Prevention Plan 08/12/2020  Post Dischage Appt Complete  Medication Screening Complete  Transportation Screening Complete  Some recent data might be hidden

## 2021-03-21 NOTE — Progress Notes (Signed)
Physical Therapy Treatment Patient Details Name: Gerald Leach MRN: [id number] DOB: September 16, 1933 Today's Date: 03/21/2021   History of Present Illness Patient is a 87 year old male who was admitted from home following fall with possible seizure as witnessed by family.  Pt was to dc from ED but experinced another seizure and was admitted.  Pt with hx of a-fib, Parkinsons, Dementia, Brain aneurysm with craniotomy (14)T7 vertebral fx s/p fall (13); C7 compression fx, and multiple falls    PT Comments    Pt requiring max assist to roll however reports not wanting to mobilize. Attempted to encourage pt with benefits of mobility and that getting to recliner would likely assist with current back pain however pt continued to refuse assist to mobilize OOB.  Pt was more helpful with repositioning in bed to more comfortable position with cues for pt to better self assist.  Continue to recommend SNF upon d/c.  Son into room end of session and states he has been out of town for 4 days however aware of plan for pt d/c to SNF.    Recommendations for follow up therapy are one component of a multi-disciplinary discharge planning process, led by the attending physician.  Recommendations may be updated based on patient status, additional functional criteria and insurance authorization.  Follow Up Recommendations  SNF     Equipment Recommendations  None recommended by PT    Recommendations for Other Services       Precautions / Restrictions Precautions Precautions: Fall Precaution Comments: seizures     Mobility  Bed Mobility Overal bed mobility: Needs Assistance Bed Mobility: Rolling Rolling: Max assist         General bed mobility comments: cues for log roll technique, pt required assist to roll and then stated he was not going to get OOB, pt also not assisting; pt encouraged for at least assisting with repositioning in bed for improving his back pain; pt able to scoot a little up [place] and then  placed bed in trendelenburg for pt to use UEs to self assist    Transfers                 General transfer comment: pt refused despite encouragement  Ambulation/Gait                 Stairs             Wheelchair Mobility    Modified Rankin (Stroke Patients Only)       Balance                                            Cognition Arousal/Alertness: Awake/alert   Overall Cognitive Status: History of cognitive impairments - at baseline                                        Exercises      General Comments        Pertinent Vitals/Pain Pain Assessment: Faces Faces Pain Scale: Hurts little more Pain Location: back Pain Descriptors / Indicators: Grimacing;Guarding Pain Intervention(s): Repositioned;Monitored during session    Home Living                      Prior Function  PT Goals (current goals can now be found in the care plan section) Progress towards PT goals: Progressing toward goals    Frequency    Min 2X/week      PT Plan Current plan remains appropriate    Co-evaluation              AM-PAC PT "6 Clicks" Mobility   Outcome Measure  Help needed turning from your back to your side while in a flat bed without using bedrails?: A Lot Help needed moving from lying on your back to sitting on the side of a flat bed without using bedrails?: A Lot Help needed moving to and from a bed to a chair (including a wheelchair)?: A Lot Help needed standing up from a chair using your arms (e.g., wheelchair or bedside chair)?: A Lot Help needed to walk in hospital room?: A Lot Help needed climbing 3-5 steps with a railing? : A Lot 6 Click Score: 12    End of Session   Activity Tolerance: Patient limited by fatigue;Patient limited by pain Patient left: in bed;with call bell/phone within reach;with bed alarm set;with family/visitor present   PT Visit Diagnosis: Muscle weakness  (generalized) (M62.81);Difficulty in walking, not elsewhere classified (R26.2)     Time: 6160-7371 PT Time Calculation (min) (ACUTE ONLY): 13 min  Charges:  $Therapeutic Activity: 8-22 mins                     Jannette Spanner PT, DPT Acute Rehabilitation Services Pager: [phone] Office: [place] 03/21/2021, 1:22 PM

## 2021-03-21 NOTE — Progress Notes (Signed)
PROGRESS NOTE  Gerald Leach MBE:[id number] DOB: [birth year] DOA: 03/17/2021 PCP: [name], NP   LOS: 2 days   Brief Narrative / Interim history: 87 year old male with history of seizure disorder, hypertension, hypothyroidism, dementia comes to the hospital with metabolic encephalopathy.  Apparently had a witnessed seizure and was seen by the family, fell to the ground and hit his head.  The seizure lasted less than couple of minutes and he was brought to the emergency room.  In the ED plan was to increase Vimpat and having go back home and follow-up with neurology but was unable to be worked up by family due to the weather, and while in the ED had a second seizure and that he was admitted to the hospital.  Subjective / 24h Interval events: He is off restraints today.  Pleasantly confused.  Assessment & Plan: Principal Problem Breakthrough seizures -At home patient is on Vimpat 100 mg twice daily and Keppra 1500 mg twice daily, neurology consulted in the ED and recommended to increase Vimpat to 150 twice daily. -Unfortunately patient is refusing p.o. intake in the setting of his delirium, continue IV agents for now, but daughter mentioned that at home he is taking pills and has been taking pills in rehab when he was in the past.  Active Problems Acute metabolic encephalopathy, underlying Parkinson's with dementia -patient with worsening encephalopathy, likely in hospital delirium on his underlying dementia. -Continue supportive treatment, continue home medications -Following 2 seizures he is extremely deconditioned, he worked with physical therapy recommending rehab to which family is in agreement -He is stable, SNF bed is available but awaiting insurance authorization  Hypothyroidism - continue synthroid   DM2- on metformin at home, last A1c in Feb was 5.6  CBG (last 3)  Recent Labs    03/20/21 2051 03/21/21 0731 03/21/21 1138  GLUCAP 144* 84 136*     GERD -  PPI  Scheduled Meds:  amantadine  100 mg Oral Daily   carbidopa-levodopa  1 tablet Oral TID   insulin aspart  0-6 Units Subcutaneous TID WC   levothyroxine  75 mcg Oral Q0600   pantoprazole  40 mg Oral Daily   QUEtiapine  12.5 mg Oral QHS   vitamin B-12  1,000 mcg Oral Daily   Continuous Infusions:  lacosamide (VIMPAT) IV 150 mg (03/21/21 1017)   levETIRAcetam 1,500 mg (03/21/21 1159)   PRN Meds:.acetaminophen **OR** acetaminophen, ondansetron **OR** ondansetron (ZOFRAN) IV  Diet Orders (From admission, onward)     Start     Ordered   03/19/21 0747  Diet Carb Modified Fluid consistency: Thin; Room service appropriate? No  Diet effective now       Question Answer Comment  Diet-HS Snack? Nothing   Calorie Level Medium 1600-2000   Fluid consistency: Thin   Room service appropriate? No      03/19/21 0746            DVT prophylaxis: SCDs Start: 03/18/21 0071     Code Status: Full Code  Family Communication: Discussed with daughter over the phone  Status is: Inpatient  Remains inpatient appropriate because:Altered mental status and Inpatient level of care appropriate due to severity of illness  Dispo: The patient is from: Home              Anticipated d/c is to: SNF              Patient currently is not medically stable to d/c.   Difficult to place patient No  Level of care: Telemetry  Consultants:  none  Procedures:  none  Microbiology  none  Antimicrobials: none    Objective: Vitals:   03/20/21 1400 03/20/21 1425 03/20/21 2040 03/21/21 1230  BP:   126/67 127/81  Pulse:   73 72  Resp: 13 19 20 19   Temp:   98 F (36.7 C) 97.8 F (36.6 C)  TempSrc:   Oral Oral  SpO2:   98% 97%  Weight:      Height:        Intake/Output Summary (Last 24 hours) at 03/21/2021 1333 Last data filed at 03/21/2021 1121 Gross per 24 hour  Intake 516 ml  Output 1050 ml  Net -534 ml    Filed Weights   03/17/21 1642 03/18/21 1700  Weight: 72.6 kg 68.5 kg     Examination:  Constitutional: He is in no distress, confused Eyes: Anicteric ENMT: mmm Neck: normal, supple Respiratory: Clear to auscultation bilaterally, no crackles, no wheezing Cardiovascular: Regular rate and rhythm, no murmurs, no peripheral edema Abdomen: Soft, nontender, nondistended, bowel sounds positive. Musculoskeletal: no clubbing / cyanosis.  Skin: No rashes seen Neurologic: No focal deficits   Data Reviewed: I have independently reviewed following labs and imaging studies   CBC: Recent Labs  Lab 03/17/21 1755 03/18/21 1242 03/19/21 0355  WBC 9.9 7.6 9.7  NEUTROABS 6.7 4.9  --   HGB 15.0 14.6 15.3  HCT 45.7 44.4 46.5  MCV 90.0 89.2 88.1  PLT 262 277 751    Basic Metabolic Panel: Recent Labs  Lab 03/17/21 1755 03/18/21 1242 03/19/21 0355  NA 138 136 135  K 4.4 3.9 4.4  CL 98 98 96*  CO2 27 28 27   GLUCOSE 90 114* 110*  BUN 14 13 15   CREATININE 0.75 0.63 0.72  CALCIUM 9.4 8.8* 9.5    Liver Function Tests: Recent Labs  Lab 03/17/21 1755 03/18/21 1242 03/19/21 0355  AST 14* 16 16  ALT 9 11 8   ALKPHOS 85 81 82  BILITOT 0.8 1.1 0.9  PROT 8.1 7.4 7.7  ALBUMIN 4.2 3.7 4.0    Coagulation Profile: No results for input(s): INR, PROTIME in the last 168 hours. HbA1C: No results for input(s): HGBA1C in the last 72 hours.  CBG: Recent Labs  Lab 03/20/21 1151 03/20/21 1730 03/20/21 2051 03/21/21 0731 03/21/21 1138  GLUCAP 99 170* 144* 84 136*     Recent Results (from the past 240 hour(s))  SARS CORONAVIRUS 2 (TAT 6-24 HRS) Nasopharyngeal Nasopharyngeal Swab     Status: None   Collection Time: 03/18/21  6:16 AM   Specimen: Nasopharyngeal Swab  Result Value Ref Range Status   SARS Coronavirus 2 NEGATIVE NEGATIVE Final    Comment: (NOTE) SARS-CoV-2 target nucleic acids are NOT DETECTED.  The SARS-CoV-2 RNA is generally detectable in upper and lower respiratory specimens during the acute phase of infection. Negative results do not  preclude SARS-CoV-2 infection, do not rule out co-infections with other pathogens, and should not be used as the sole basis for treatment or other patient management decisions. Negative results must be combined with clinical observations, patient history, and epidemiological information. The expected result is Negative.  Fact Sheet for Patients: SugarRoll.be  Fact Sheet for Healthcare Providers: https://www.woods-mathews.com/  This test is not yet approved or cleared by the [place] FDA and  has been authorized for detection and/or diagnosis of SARS-CoV-2 by FDA under an Emergency Use Authorization (EUA). This EUA will remain  in effect (meaning this test can  be used) for the duration of the COVID-19 declaration under Se ction 564(b)(1) of the Act, 21 U.S.C. section 360bbb-3(b)(1), unless the authorization is terminated or revoked sooner.  Performed at [facility], [place] [address] [place],  [address]       Radiology Studies: No results found.  Marzetta Board, MD, PhD [facility]  Between 7 am - 7 pm I am available, please contact me via Amion (for emergencies) or Securechat (non urgent messages)  Between 7 pm - 7 am I am not available, please contact night coverage MD/APP via Amion

## 2021-03-21 NOTE — Plan of Care (Signed)
  Problem: Coping: Goal: Level of anxiety will decrease Outcome: Progressing   Problem: Elimination: Goal: Will not experience complications related to urinary retention Outcome: Progressing   Problem: Pain Managment: Goal: General experience of comfort will improve Outcome: Progressing   Problem: Safety: Goal: Ability to remain free from injury will improve Outcome: Progressing   Problem: Skin Integrity: Goal: Risk for impaired skin integrity will decrease Outcome: Progressing   Problem: Safety: Goal: Non-violent Restraint(s) Outcome: Progressing

## 2021-03-22 DIAGNOSIS — I482 Chronic atrial fibrillation, unspecified: Secondary | ICD-10-CM

## 2021-03-22 LAB — COMPREHENSIVE METABOLIC PANEL
ALT: 7 U/L (ref 0–44)
AST: 13 U/L — ABNORMAL LOW (ref 15–41)
Albumin: 3.5 g/dL (ref 3.5–5.0)
Alkaline Phosphatase: 70 U/L (ref 38–126)
Anion gap: 8 (ref 5–15)
BUN: 19 mg/dL (ref 8–23)
CO2: 29 mmol/L (ref 22–32)
Calcium: 8.9 mg/dL (ref 8.9–10.3)
Chloride: 104 mmol/L (ref 98–111)
Creatinine, Ser: 0.52 mg/dL — ABNORMAL LOW (ref 0.61–1.24)
GFR, Estimated: >60 mL/min (ref >60)
Glucose, Bld: 104 mg/dL — ABNORMAL HIGH (ref 70–99)
Potassium: 4.3 mmol/L (ref 3.5–5.1)
Sodium: 141 mmol/L (ref 135–145)
Total Bilirubin: 0.7 mg/dL (ref 0.3–1.2)
Total Protein: 6.9 g/dL (ref 6.5–8.1)

## 2021-03-22 LAB — CBC WITH DIFFERENTIAL/PLATELET
Abs Immature Granulocytes: 0.03 10*3/uL (ref 0.00–0.07)
Basophils Absolute: 0 10*3/uL (ref 0.0–0.1)
Basophils Relative: 1 %
Eosinophils Absolute: 0.3 10*3/uL (ref 0.0–0.5)
Eosinophils Relative: 3 %
HCT: 44.1 % (ref 39.0–52.0)
Hemoglobin: 14.7 g/dL (ref 13.0–17.0)
Immature Granulocytes: 0 %
Lymphocytes Relative: 27 %
Lymphs Abs: 2.1 10*3/uL (ref 0.7–4.0)
MCH: 29.5 pg (ref 26.0–34.0)
MCHC: 33.3 g/dL (ref 30.0–36.0)
MCV: 88.4 fL (ref 80.0–100.0)
Monocytes Absolute: 0.8 10*3/uL (ref 0.1–1.0)
Monocytes Relative: 11 %
Neutro Abs: 4.6 10*3/uL (ref 1.7–7.7)
Neutrophils Relative %: 58 %
Platelets: 232 10*3/uL (ref 150–400)
RBC: 4.99 MIL/uL (ref 4.22–5.81)
RDW: 12.9 % (ref 11.5–15.5)
WBC: 7.9 10*3/uL (ref 4.0–10.5)
nRBC: 0 % (ref 0.0–0.2)

## 2021-03-22 LAB — GLUCOSE, CAPILLARY
Glucose-Capillary: 96 mg/dL (ref 70–99)
Glucose-Capillary: 143 mg/dL — ABNORMAL HIGH (ref 70–99)
Glucose-Capillary: 99 mg/dL (ref 70–99)

## 2021-03-22 LAB — PHOSPHORUS: Phosphorus: 3.4 mg/dL (ref 2.5–4.6)

## 2021-03-22 LAB — TROPONIN I (HIGH SENSITIVITY)
Troponin I (High Sensitivity): 4 ng/L (ref <18)
Troponin I (High Sensitivity): 4 ng/L (ref <18)

## 2021-03-22 LAB — TSH: TSH: 5.18 u[IU]/mL — ABNORMAL HIGH (ref 0.350–4.500)

## 2021-03-22 LAB — MAGNESIUM: Magnesium: 1.9 mg/dL (ref 1.7–2.4)

## 2021-03-22 LAB — T4, FREE: Free T4: 0.95 ng/dL (ref 0.61–1.12)

## 2021-03-22 NOTE — Consult Note (Addendum)
Cardiology Consultation:   [name] MRN: [id number]; DOB: 09/09/1933  Admit date: 03/17/2021 Date of Consult: 03/22/2021  PCP:  [name], NP   [facility] Providers Cardiologist:  New - remotely seen by Dr Gwenlyn Found in 2018 for lower extremity nonhealing ulcers     Patient Profile:   Gerald Leach is a 87 y.o. male with a hx of DM II, chronic atrial fibrillation, hypothyroidism and seizures who is being seen 03/22/2021 for the evaluation of atrial fibrillation at the request of Dr. Alfredia Ferguson.  History of Present Illness:   Gerald Leach is a 87 yo male with a PMH of DM II, chronic atrial fibrillation, hypothyroidism and seizures.  Patient does not have a prior history of stroke or MI.  He does have history of seizure disorder and Parkinson's disease.  Patient has quite significant dementia and is unable to tell me if he has seen by cardiology service before.  The only cardiology note I can locate is his visit with Dr. Alvester Chou in November 2018 for evaluation of lower extremity nonhealing ulcers.  ABI at the time showed a normal blood flow with no sign of significant blockage. LE ulcers was felt to be due to small vessel disease related to DM II. Looking back, patient was admitted twice in February 2017 with seizure.  EKG obtained during the hospitalization showed new atrial fibrillation with controlled heart rate.  By the time he returned and had repeat EKG in 2018, he was back in sinus rhythm.  Since then, every single EKG from September 2019, February 2022 and most recently September 2022 all showed rate controlled atrial fibrillation.  Patient himself denies any cardiac awareness of palpitation or fluttering sensation in the chest.  He denies any worsening dyspnea.  He denies any awareness of the irregular heartbeat.  He lives by himself and has one son and 2 daughters.  His son manage his medication and laid them out in a pillbox.  Patient was admitted to the hospital after a  significant fall in February 2022.  After discharge, he returned back to the ED on 3/8 with recurrent seizure.  He is being followed closely as outpatient by family medicine and neurology service Dr. Narda Amber.  He was readmitted to [facility] on 03/17/2021 with recurrent seizure and a fall while walking outside.  The seizure episode was witnessed by his family member.  He fell to the ground and hit his head.  A CT of the head was negative for acute process, unchanged 2.5 cm calcified supraclinoid right ICA aneurysm.  Neurology service was consulted and that his home Vimpat was increased to 150 mg twice a day from the previous 100 mg twice a day.  He is also on Keppra.  Hospital course was complicated by delirium.  The current plan is potentially transfer to skilled nursing facility.   Past Medical History:  Diagnosis Date   Brain aneurysm    Colonic polyp 2003   Dementia ([place])    Diabetes mellitus without complication [facility])    ED (erectile dysfunction)    GERD (gastroesophageal reflux disease)    Hyperlipidemia    Hypertension    Hypothyroidism    Seizures ([place])    per family   Seizures [facility])     Past Surgical History:  Procedure Laterality Date   Aneurysmal clipping     brain aneurysm surgery     at [facility] 2002   carotid artery aneurysm     COLONOSCOPY  polyps   CRANIOTOMY Right 09/19/2012   Procedure: CRANIOTOMY HEMATOMA EVACUATION SUBDURAL;  Surgeon: Otilio Connors, MD;  Location: [facility];  Service: Neurosurgery;  Laterality: Right;   CRANIOTOMY Right 09/22/2012   Procedure: CRANIOTOMY HEMATOMA EVACUATION SUBDURAL;  Surgeon: Otilio Connors, MD;  Location: [facility];  Service: Neurosurgery;  Laterality: Right;   Craniotomy.     right hernia       Home Medications:  Prior to Admission medications   Medication Sig Start Date End Date Taking? Authorizing Provider  amantadine (SYMMETREL) 100 MG capsule TAKE 1 CAPSULE BY MOUTH EVERY DAY Patient taking  differently: Take 100 mg by mouth daily. 07/12/20  Yes Patel, Donika K, DO  carbidopa-levodopa (SINEMET IR) 25-100 MG tablet TAKE 1 TABLET BY MOUTH THREE TIMES A DAY 02/07/21  Yes Patel, Donika K, DO  Lacosamide (VIMPAT) 100 MG TABS Take 1 tablet (100 mg total) by mouth 2 (two) times daily. 01/27/21  Yes Patel, Donika K, DO  lacosamide (VIMPAT) 50 MG TABS tablet Take 50 mg twice a day with the 100 mg tabs for a total of 100 mg twice a day. 03/17/21  Yes Davonna Belling, MD  levothyroxine (SYNTHROID) 75 MCG tablet TAKE 1 TABLET BY MOUTH EVERY DAY 02/07/21  Yes Nafziger, Tommi Rumps, NP  metFORMIN (GLUCOPHAGE) 500 MG tablet TAKE 1 TABLET (500 MG TOTAL) BY MOUTH 2 (TWO) TIMES DAILY WITH A MEAL. NEED APPOINTMENT Patient taking differently: Take 500 mg by mouth 2 (two) times daily with a meal. 12/22/20  Yes Nafziger, Tommi Rumps, NP  omeprazole (PRILOSEC) 20 MG capsule TAKE 1 CAPSULE BY MOUTH EVERY DAY 02/07/21  Yes Nafziger, Tommi Rumps, NP  levETIRAcetam (KEPPRA) 750 MG tablet TAKE 2 TABLETS (1,500 MG TOTAL) BY MOUTH 2 (TWO) TIMES DAILY. 04/29/20   Narda Amber K, DO  vitamin B-12 (CYANOCOBALAMIN) 1000 MCG tablet Take 1,000 mcg by mouth daily.    [provider]    Inpatient Medications: Scheduled Meds:  amantadine  100 mg Oral Daily   carbidopa-levodopa  1 tablet Oral TID   insulin aspart  0-6 Units Subcutaneous TID WC   levothyroxine  75 mcg Oral Q0600   pantoprazole  40 mg Oral Daily   QUEtiapine  12.5 mg Oral QHS   vitamin B-12  1,000 mcg Oral Daily   Continuous Infusions:  lacosamide (VIMPAT) IV 150 mg (03/22/21 1009)   levETIRAcetam 1,500 mg (03/22/21 1157)   PRN Meds: acetaminophen **OR** acetaminophen, ondansetron **OR** ondansetron (ZOFRAN) IV  Allergies:   No Known Allergies  Social History:   Social History   Socioeconomic History   Marital status: Widowed    Spouse name: Not on file   Number of children: 4   Years of education: 51   Highest education level: 12th grade  Occupational  History   Occupation: retired  Tobacco Use   Smoking status: Never    Passive exposure: Past   Smokeless tobacco: Never  Vaping Use   Vaping Use: Never used  Substance and Sexual Activity   Alcohol use: Yes    Alcohol/week: 0.0 standard drinks    Comment: rum mixed in diet coke 3 a week   Drug use: Yes    Types: Nitrous oxide   Sexual activity: Not Currently  Other Topics Concern   Not on file  Social History Narrative   ** Merged History Encounter **       Lives with wife in a 2 story home.  Has no trouble with the stairs as long  as he holds on to the banister.  Education: college.  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     Social Determinants of Health   Financial Resource Strain: Low Risk    Difficulty of Paying Living Expenses: Not hard at all  Food Insecurity: No Food Insecurity   Worried About Charity fundraiser in the Last Year: Never true   [place] in the Last Year: Never true  Transportation Needs: No Transportation Needs   Lack of Transportation (Medical): No   Lack of Transportation (Non-Medical): No  Physical Activity: Inactive   Days of Exercise per Week: 0 days   Minutes of Exercise per Session: 0 min  Stress: No Stress Concern Present   Feeling of Stress : Not at all  Social Connections: Moderately Isolated   Frequency of Communication with Friends and Family: More than three times a week   Frequency of Social Gatherings with Friends and Family: More than three times a week   Attends Religious Services: 1 to 4 times per year   Active Member of [employer] or Organizations: No   Attends Archivist Meetings: Never   Marital Status: Widowed  Human resources officer Violence: Not At Risk   Fear of Current or Ex-Partner: No   Emotionally Abused: No   Physically Abused: No   Sexually Abused: No    Family History:    Family History  Problem Relation Age of Onset   Liver disease Brother        Died   Seizures Neg Hx       ROS:  Please see the history of present illness.   All other ROS reviewed and negative.     Physical Exam/Data:   Vitals:   03/20/21 2040 03/21/21 1230 03/21/21 2011 03/22/21 0354  BP: 126/67 127/81 138/78 (!) 156/90  Pulse: 73 72 (!) 55 (!) 55  Resp: 20 19 16 16   Temp:  97.8 F (36.6 C) 98 F (36.7 C) 97.8 F (36.6 C)  TempSrc: Oral Oral Oral Oral  SpO2: 98% 97% 98% 97%  Weight:      Height:        Intake/Output Summary (Last 24 hours) at 03/22/2021 1204 Last data filed at 03/22/2021 1100 Gross per 24 hour  Intake 700 ml  Output 750 ml  Net -50 ml   Last 3 Weights 03/18/2021 03/17/2021 12/09/2020  Weight (lbs) 151 lb 160 lb 165 lb  Weight (kg) 68.493 kg 72.576 kg 74.844 kg     Body mass index is 20.48 kg/m.  General:  Well nourished, well developed, in no acute distress HEENT: normal Neck: no JVD Vascular: No carotid bruits; Distal pulses 2+ bilaterally Cardiac:  normal S1, S2; RRR; no murmur  Lungs:  clear to auscultation bilaterally, no wheezing, rhonchi or rales  Abd: soft, nontender, no hepatomegaly  Ext: no edema Musculoskeletal:  No deformities, BUE and BLE strength normal and equal Skin: warm and dry  Neuro:  CNs 2-12 intact, no focal abnormalities noted Psych:  Normal affect   EKG:  The EKG was personally reviewed and demonstrates:  atrial fibrillation, no significant ST-T wave changes Telemetry:  Telemetry was personally reviewed and demonstrates:  rate controlled atrial fibrillation, no pauses >3 sec  Relevant CV Studies:    Laboratory Data:  High Sensitivity Troponin:  No results for input(s): TROPONINIHS in the last 720 hours.   Chemistry Recent Labs  Lab 03/18/21 1242 03/19/21 0355 03/22/21 0857  NA 136  135 141  K 3.9 4.4 4.3  CL 98 96* 104  CO2 28 27 29   GLUCOSE 114* 110* 104*  BUN 13 15 19   CREATININE 0.63 0.72 0.52*  CALCIUM 8.8* 9.5 8.9  MG  --   --  1.9  GFRNONAA >60 >60 >60  ANIONGAP 10 12 8     Recent Labs  Lab  03/18/21 1242 03/19/21 0355 03/22/21 0857  PROT 7.4 7.7 6.9  ALBUMIN 3.7 4.0 3.5  AST 16 16 13*  ALT 11 8 7   ALKPHOS 81 82 70  BILITOT 1.1 0.9 0.7   Lipids No results for input(s): CHOL, TRIG, HDL, LABVLDL, LDLCALC, CHOLHDL in the last 168 hours.  Hematology Recent Labs  Lab 03/18/21 1242 03/19/21 0355 03/22/21 0857  WBC 7.6 9.7 7.9  RBC 4.98 5.28 4.99  HGB 14.6 15.3 14.7  HCT 44.4 46.5 44.1  MCV 89.2 88.1 88.4  MCH 29.3 29.0 29.5  MCHC 32.9 32.9 33.3  RDW 12.7 12.7 12.9  PLT 277 270 232   Thyroid No results for input(s): TSH, FREET4 in the last 168 hours.  BNPNo results for input(s): BNP, PROBNP in the last 168 hours.  DDimer No results for input(s): DDIMER in the last 168 hours.   Radiology/Studies:  No results found.   Assessment and Plan:   Chronic atrial fibrillation  -First episode of atrial fibrillation occurred in the setting of admission for seizure in February 2017, he was back in sinus rhythm based on EKG in 2018.  Unfortunately, since then, every single EKG suggest patient has been back in A. fib since 2019.  -He has no cardiac awareness of atrial fibrillation, heart rate self-controlled without any AV nodal blocking agent.  Longest pause is less than 3 seconds.  Patient is asymptomatic.  He is self rate controlled.   -Only remaining question at this time is anticoagulation therapy.  He has extremely poor memory and he clearly has dementia.  Previously his son manage his medication, however patient lived by himself.  Compliance is questionable, furthermore, he has history of recurrent seizure and fall which make him not ideal candidate for anticoagulation therapy's fall risk and bleeding outweigh the potential benefit.  - MD to decide on echocardiogram  Recurrent seizure: Home Vimpat has been increased by neurology team.  On Keppra.  DM2: On metformin at home  Parkinson's   Risk Assessment/Risk Scores:          CHA2DS2-VASc Score = 3   This  indicates a 3.2% annual risk of stroke. [name] score is based upon: CHF History: 0 HTN History: 0 Diabetes History: 1 Stroke History: 0 Vascular Disease History: 0 Age Score: 2 Gender Score: 0        For questions or updates, please contact [place] Please consult [email or web address] for contact info under    Signed, Almyra Deforest, [place]  03/22/2021 12:04 PM  Patient seen and examined   I agree with findings as noted above by Janan Ridge  Pt is an 87 yo with hx of seizure d/o, Parkinsons, dementia.   Hx LE ulcers in past  Has hx of afib in past (2017, 2019 and on)  Adimitted after seizure / fall.  Pt still in afib  Pt not a good historian, not reliable    Denies palpitations  On exam;    Neck  JVP is normal  Lungs CTA  Cardiac exam   Irreg irreg   No S3   No murmurs Abd is supple  Ext  are without edema   Atrial fibrilation;   Patient's rates are wlll controlled   He does not sense this        Concern is if patient is a candidate for anticoagulation    He lives alone, falls   Unless he goes to a supervised center with no unsupervised activity then I would be reluctant to start  Seizure   Folloing with neuro     Will follow tele.   Check echo   Check TSH

## 2021-03-22 NOTE — Progress Notes (Signed)
Notified on call provider about patient's heart rate getting down to 36 and in A. Flutter per Tele report. No change in patient's assessment. Patient was given a bath and the heart monitor leads were changed, since some leads were not in the right place. On call provider mentioned that it was not uncommon to go back in forth from A. Fib to A. Flutter. Nurse will continue to monitor for any changes.

## 2021-03-22 NOTE — TOC Progression Note (Signed)
Transition of Care [facility]) - Progression Note    Patient Details  Name: Gerald Leach MRN: [id number] Date of Birth: 30-Jan-1934  [facility] CM/SW Contact  Ross Ludwig, [place] Phone Number: 03/22/2021, 10:23 AM  Clinical Narrative:     CSW spoke to [facility], they can accept patient once he is medically ready for discharge and new Covid test has been completed.  Patient has been approved by [facility].  Authorization valide till 03/24/2021.  CSW updated attending physician.   Expected Discharge Plan: [place] Barriers to Discharge: Continued Medical Work up  Expected Discharge Plan and Services Expected Discharge Plan: [place]   Discharge Planning Services: CM Consult   Living arrangements for the past 2 months: Single Family Home                                       Social Determinants of Health (SDOH) Interventions    Readmission Risk Interventions Readmission Risk Prevention Plan 08/12/2020  Post Dischage Appt Complete  Medication Screening Complete  Transportation Screening Complete  Some recent data might be hidden

## 2021-03-22 NOTE — Progress Notes (Signed)
Notified on call provider about patient being in atrial flutter with varying block at shift change, which nurse was unaware until Tele called to give report. Patient back in A. Fib. Nurse went and assessed patient. No changes in assessment. Patient was alert and asking questions. Also, on call provider was notified due to patient having a 2.26 second sinus pause. Will continue to monitor patient for any changes.

## 2021-03-22 NOTE — Progress Notes (Signed)
Occupational Therapy Treatment Patient Details Name: Gerald Leach MRN: [id number] DOB: 20-Mar-1934 Today's Date: 03/22/2021   History of present illness Patient is a 87 year old male who was admitted from home following fall with possible seizure as witnessed by family.  Pt was to dc from ED but experinced another seizure and was admitted.  Pt with hx of a-fib, Parkinsons, Dementia, Brain aneurysm with craniotomy (14)T7 vertebral fx s/p fall (13); C7 compression fx, and multiple falls   OT comments  Pt needing more assistance today than previous sessions, most noticeably with functional mobility secondary to balance and festinating gait. However, cognition and participation increased from previous session.Currently recommend short-term rehab at d/c. Continue plan of care.   Recommendations for follow up therapy are one component of a multi-disciplinary discharge planning process, led by the attending physician.  Recommendations may be updated based on patient status, additional functional criteria and insurance authorization.    Follow Up Recommendations  SNF    Equipment Recommendations  Other (comment) (TBD)    Recommendations for Other Services      Precautions / Restrictions Precautions Precautions: Fall Precaution Comments: seizures Restrictions Weight Bearing Restrictions: No       Mobility Bed Mobility Overal bed mobility: Needs Assistance Bed Mobility: Supine to Sit     Supine to sit: Min assist     General bed mobility comments: Able to bring legs off EOB, needed assistance to scoot hips forward to EOB.    Transfers Overall transfer level: Needs assistance Equipment used: Rolling walker (2 wheeled) Transfers: Stand Pivot Transfers Sit to Stand: Mod assist;+2 physical assistance;From elevated surface Stand pivot transfers: Mod assist;+2 physical assistance       General transfer comment: Mod +2 to power up from elevated bed height, posterior bias, and  festinating gait hindered transfer to chair.    Balance Overall balance assessment: Needs assistance Sitting-balance support: Feet supported;No upper extremity supported Sitting balance-Leahy Scale: Fair   Postural control: Posterior lean Standing balance support: Bilateral upper extremity supported Standing balance-Leahy Scale: Poor Standing balance comment: Unable to maintain balance without BUE support with RW and +2 therapist support                           ADL either performed or assessed with clinical judgement   ADL Overall ADL's : Needs assistance/impaired     Grooming: Sitting;Oral care;Set up Grooming Details (indicate cue type and reason): Pt transferred to recliner and performing oral care                     Toileting- Clothing Manipulation and Hygiene: Sit to/from stand;+2 for physical assistance;Total assistance Toileting - Clothing Manipulation Details (indicate cue type and reason): Pt found to be incontinent, total A +2 for sit to stand and perianal care     Functional mobility during ADLs: Moderate assistance;+2 for physical assistance;Rolling walker;Cueing for safety General ADL Comments: With posterior lean and tilt, unable to shift hips forward into standing, requiring +2 assistance for all standing ADLs     Vision Patient Visual Report: No change from baseline     Perception     Praxis      Cognition Arousal/Alertness: Awake/alert Behavior During Therapy: WFL for tasks assessed/performed Overall Cognitive Status: History of cognitive impairments - at baseline  General Comments: Alert to self and place, able to follow all commands        Exercises     Shoulder Instructions       General Comments      Pertinent Vitals/ Pain       Pain Assessment: Faces Faces Pain Scale: No hurt  Home Living                                          Prior  Functioning/Environment              Frequency  Min 2X/week        Progress Toward Goals  OT Goals(current goals can now be found in the care plan section)  Progress towards OT goals: Progressing toward goals  Acute Rehab OT Goals Patient Stated Goal: to walk OT Goal Formulation: With patient Time For Goal Achievement: 04/02/21 Potential to Achieve Goals: [place] Discharge plan needs to be updated    Co-evaluation          OT goals addressed during session: ADL's and self-care      AM-PAC OT "6 Clicks" Daily Activity     Outcome Measure   Help from another person eating meals?: A Little Help from another person taking care of personal grooming?: A Little Help from another person toileting, which includes using toliet, bedpan, or urinal?: Total Help from another person bathing (including washing, rinsing, drying)?: A Lot Help from another person to put on and taking off regular upper body clothing?: A Little Help from another person to put on and taking off regular lower body clothing?: Total 6 Click Score: 13    End of Session Equipment Utilized During Treatment: Rolling walker  OT Visit Diagnosis: Muscle weakness (generalized) (M62.81);Pain;History of falling (Z91.81);Unsteadiness on feet (R26.81)   Activity Tolerance Patient tolerated treatment well   Patient Left in chair;with call bell/phone within reach;with chair alarm set   Nurse Communication Mobility status        Time: 5038-8828 OT Time Calculation (min): 26 min  Charges: OT General Charges $OT Visit: 1 Visit OT Treatments $Self Care/Home Management : 23-37 mins  Lacee Grey, OTR/L [place]  Office [phone] Pager: [place] 03/22/2021, 3:45 PM

## 2021-03-22 NOTE — Plan of Care (Signed)

## 2021-03-22 NOTE — Plan of Care (Signed)
  Problem: Clinical Measurements: Goal: Ability to maintain clinical measurements within normal limits will improve Outcome: Progressing Goal: Will remain free from infection Outcome: Progressing Goal: Diagnostic test results will improve Outcome: Progressing Goal: Respiratory complications will improve Outcome: Progressing Goal: Cardiovascular complication will be avoided Outcome: Progressing   Problem: Activity: Goal: Risk for activity intolerance will decrease Outcome: Progressing   Problem: Nutrition: Goal: Adequate nutrition will be maintained Outcome: Progressing   Problem: Coping: Goal: Level of anxiety will decrease Outcome: Progressing   Problem: Elimination: Goal: Will not experience complications related to bowel motility Outcome: Progressing Goal: Will not experience complications related to urinary retention Outcome: Progressing   Problem: Pain Managment: Goal: General experience of comfort will improve Outcome: Progressing   Problem: Safety: Goal: Ability to remain free from injury will improve Outcome: Progressing   Problem: Skin Integrity: Goal: Risk for impaired skin integrity will decrease Outcome: Progressing   Problem: Education: Goal: Knowledge of General Education information will improve Description: Including pain rating scale, medication(s)/side effects and non-pharmacologic comfort measures Outcome: Not Progressing   Problem: Health Behavior/Discharge Planning: Goal: Ability to manage health-related needs will improve Outcome: Not Progressing   

## 2021-03-22 NOTE — Progress Notes (Signed)
PROGRESS NOTE    Gerald Leach  ZOX:[id number] DOB: 1934-03-22 DOA: 03/17/2021 PCP: [name], NP   Brief Narrative:  The patient is an 87 year old Caucasian male with a past medical history significant for but not limited to seizure disorder, hypertension, hypothyroidism, dementia as well as other comorbidities including history of atrial fibrillation who presented to the hospital with metabolic encephalopathy.  Apparently he had a witnessed seizure and this was seen by his family and he fell to the ground and hit his head.  The seizure lasted less than a couple minutes and he was brought to the emergency room.  In the ED neurology was curb sided and the plan was to increase his Vimpat and have the patient go back home and follow-up with his neurologist but he was unable to be transported by the family due to weather and while in the ED he had a second seizure and then subsequently was admitted to the hospital.  He was admitted for breakthrough seizure and at home the patient takes Vimpat 100 mg p.o. twice daily and Keppra 1500 mL p.o. twice daily and neurology was consulted and recommended increasing the Vimpat 250 mg p.o. twice daily.  His p.o. intake has been intermittent and in the setting of his delirium we continued his IV agents for now but daughter had mentioned that he takes his pills and takes his pills at rehab when he was there in the past.  He was also found to be in A. fib that is rate controlled and cardiology was consulted and recommended checking an echocardiogram as well as a TSH.  Given his history of falls he may not be the best candidate for anticoagulation.  Currently awaiting cardiology clearance and placement at SNF.  Assessment & Plan:   Active Problems:   Seizure ([place])   Breakthrough seizure ([place])  Breakthrough seizures -Admitted after his second witnessed seizure the second 1 being in the ED -At home the patient takes Vimpat 100 mg p.o. twice daily and Keppra 1500 mg  p.o. twice daily -Neurology was consulted in the ED and recommended increasing the Vimpat 250 mg p.o. twice daily -Unfortunately the patient has been refusing p.o. intake in setting of his delirium and dementia -We have continued his IV agents for now but his daughter mentioned that at home he takes his pills and he has been taking pills in rehab while he was there in the past so we will transition to p.o. prior to discharge -He will need to follow-up with his primary neurologist [name] in the outpatient setting  Acute metabolic encephalopathy with history of underlying Parkinson's with dementia -Patient has had worsening encephalopathy likely hospital delirium on his underlying -We will continue supportive treatments and continue his home medications including amantadine 100 mg p.o. daily and carbidopa-levodopa 25-100 mg/tab. 1 tab p.o. 3 times daily as well as quetiapine 12.5 mg p.o. nightly -Following history of seizures he became extremely deconditioned and he is worked with physical therapy and they recommended rehab which the family is in agreement with -He is improved from the standpoint and SNF bed is available and insurance authorization is obtained however we need to make sure that he is stable from a cardiac perspective prior to discharging  Atrial fibrillation likely chronic -Remains rate controlled -Troponins checked x2 and were flat at 4 -Cardiology has been consulted given his a flutter A. Fib - while hospitalization and they are recommending checking a TSH and an echocardiogram -Cardiology is hesitant to start the  patient on anticoagulation given that he may not be a candidate for anticoagulation given that he lives alone and has recurrent falls and they feel that unless he goes a supervised center with no unsupervised activity then they would be reluctant to start -He has a CHA2DS2-VASc score of 4 given his age and his diabetes history as well as hypertension  history  Hypothyroidism -Check a TSH -C/w Home Levothyroxine 75 mcg for now  Diabetes Mellitus Type 2 -He takes metformin at home -His last A1c was 5.6 back in February 2022 -Continue monitor CBGs per protocol; CBGs ranging from 96-143 -Continue with very sensitive NovoLog sliding scale insulin AC  GERD/GI prophylaxis -Continue with Pantoprazole 40 mg p.o. daily  DVT prophylaxis: SCDs Code Status: FULL CODE Family Communication: No Family currently at bedside Disposition Plan: SNF once cleared from a cardiology perspective  Status is: Inpatient  Remains inpatient appropriate because:Unsafe d/c plan, IV treatments appropriate due to intensity of illness or inability to take PO, and Inpatient level of care appropriate due to severity of illness  Dispo: The patient is from: Home              Anticipated d/c is to: SNF              Patient currently is not medically stable to d/c.   Difficult to place patient No  Consultants:  Cardiology  Procedures: ECHOCARDIOGRAM  Antimicrobials:  Anti-infectives (From admission, onward)    None        Subjective: Seen and examined at bedside and he is no complaints currently.  Felt okay.  Ate all his breakfast.  Denies any chest pain or shortness of breath.  No other concerns or complaints at this time.  Objective: Vitals:   03/20/21 2040 03/21/21 1230 03/21/21 2011 03/22/21 0354  BP: 126/67 127/81 138/78 (!) 156/90  Pulse: 73 72 (!) 55 (!) 55  Resp: 20 19 16 16   Temp:  97.8 F (36.6 C) 98 F (36.7 C) 97.8 F (36.6 C)  TempSrc: Oral Oral Oral Oral  SpO2: 98% 97% 98% 97%  Weight:      Height:        Intake/Output Summary (Last 24 hours) at 03/22/2021 1446 Last data filed at 03/22/2021 1100 Gross per 24 hour  Intake 700 ml  Output 450 ml  Net 250 ml   Filed Weights   03/17/21 1642 03/18/21 1700  Weight: 72.6 kg 68.5 kg   Examination: Physical Exam:  Constitutional: Elderly thin Caucasian male currently in no acute  distress appears calm  Eyes:  Lids and conjunctivae normal, sclerae anicteric  ENMT: External Ears, Nose appear normal. Grossly normal hearing.  Neck: Appears normal, supple, no cervical masses, normal ROM, no appreciable thyromegaly; no appreciable JVD Respiratory: Diminished to auscultation bilaterally, no wheezing, rales, rhonchi or crackles. Normal respiratory effort and patient is not tachypenic. No accessory muscle use.  Unlabored breathing and not wearing supplemental oxygen via nasal cannula Cardiovascular: Irregularly irregular and slightly bradycardic, no murmurs / rubs / gallops. S1 and S2 auscultated.  Mild extremity edema Abdomen: Soft, non-tender, non-distended.  Bowel sounds positive.  GU: Deferred. Musculoskeletal: No clubbing / cyanosis of digits/nails. No joint deformity upper and lower extremities.  Skin: No rashes, lesions, ulcers on limited skin evaluation. No induration; Warm and dry.  Neurologic: CN 2-12 grossly intact with no focal deficits. Romberg sign and cerebellar reflexes not assessed.  Psychiatric: Slightly impaired judgment and insight.  He is awake and alert but not fully  oriented x 3. Normal mood and appropriate affect.   Data Reviewed: I have personally reviewed following labs and imaging studies  CBC: Recent Labs  Lab 03/17/21 1755 03/18/21 1242 03/19/21 0355 03/22/21 0857  WBC 9.9 7.6 9.7 7.9  NEUTROABS 6.7 4.9  --  4.6  HGB 15.0 14.6 15.3 14.7  HCT 45.7 44.4 46.5 44.1  MCV 90.0 89.2 88.1 88.4  PLT 262 277 270 824   Basic Metabolic Panel: Recent Labs  Lab 03/17/21 1755 03/18/21 1242 03/19/21 0355 03/22/21 0857  NA 138 136 135 141  K 4.4 3.9 4.4 4.3  CL 98 98 96* 104  CO2 27 28 27 29   GLUCOSE 90 114* 110* 104*  BUN 14 13 15 19   CREATININE 0.75 0.63 0.72 0.52*  CALCIUM 9.4 8.8* 9.5 8.9  MG  --   --   --  1.9  PHOS  --   --   --  3.4   GFR: Estimated Creatinine Clearance: 65.4 mL/min (A) (by C-G formula based on SCr of 0.52 mg/dL  (L)). Liver Function Tests: Recent Labs  Lab 03/17/21 1755 03/18/21 1242 03/19/21 0355 03/22/21 0857  AST 14* 16 16 13*  ALT 9 11 8 7   ALKPHOS 85 81 82 70  BILITOT 0.8 1.1 0.9 0.7  PROT 8.1 7.4 7.7 6.9  ALBUMIN 4.2 3.7 4.0 3.5   No results for input(s): LIPASE, AMYLASE in the last 168 hours. No results for input(s): AMMONIA in the last 168 hours. Coagulation Profile: No results for input(s): INR, PROTIME in the last 168 hours. Cardiac Enzymes: No results for input(s): CKTOTAL, CKMB, CKMBINDEX, TROPONINI in the last 168 hours. BNP (last 3 results) No results for input(s): PROBNP in the last 8760 hours. HbA1C: No results for input(s): HGBA1C in the last 72 hours. CBG: Recent Labs  Lab 03/21/21 1634 03/21/21 1716 03/21/21 2006 03/22/21 0736 03/22/21 1142  GLUCAP 101* 113* 142* 96 143*   Lipid Profile: No results for input(s): CHOL, HDL, LDLCALC, TRIG, CHOLHDL, LDLDIRECT in the last 72 hours. Thyroid Function Tests: No results for input(s): TSH, T4TOTAL, FREET4, T3FREE, THYROIDAB in the last 72 hours. Anemia Panel: No results for input(s): VITAMINB12, FOLATE, FERRITIN, TIBC, IRON, RETICCTPCT in the last 72 hours. Sepsis Labs: No results for input(s): PROCALCITON, LATICACIDVEN in the last 168 hours.  Recent Results (from the past 240 hour(s))  SARS CORONAVIRUS 2 (TAT 6-24 HRS) Nasopharyngeal Nasopharyngeal Swab     Status: None   Collection Time: 03/18/21  6:16 AM   Specimen: Nasopharyngeal Swab  Result Value Ref Range Status   SARS Coronavirus 2 NEGATIVE NEGATIVE Final    Comment: (NOTE) SARS-CoV-2 target nucleic acids are NOT DETECTED.  The SARS-CoV-2 RNA is generally detectable in upper and lower respiratory specimens during the acute phase of infection. Negative results do not preclude SARS-CoV-2 infection, do not rule out co-infections with other pathogens, and should not be used as the sole basis for treatment or other patient management decisions. Negative  results must be combined with clinical observations, patient history, and epidemiological information. The expected result is Negative.  Fact Sheet for Patients: SugarRoll.be  Fact Sheet for Healthcare Providers: https://www.woods-mathews.com/  This test is not yet approved or cleared by the [place] FDA and  has been authorized for detection and/or diagnosis of SARS-CoV-2 by FDA under an Emergency Use Authorization (EUA). This EUA will remain  in effect (meaning this test can be used) for the duration of the COVID-19 declaration under Se ction 564(b)(1) of  the Act, 21 U.S.C. section 360bbb-3(b)(1), unless the authorization is terminated or revoked sooner.  Performed at [facility], [place] [address] [place],  [address]     RN Pressure Injury Documentation:     Estimated body mass index is 20.48 kg/m as calculated from the following:   Height as of this encounter: 6' (1.829 m).   Weight as of this encounter: 68.5 kg.  Malnutrition Type:   Malnutrition Characteristics:   Nutrition Interventions:    Radiology Studies: No results found.  Scheduled Meds:  amantadine  100 mg Oral Daily   carbidopa-levodopa  1 tablet Oral TID   insulin aspart  0-6 Units Subcutaneous TID WC   levothyroxine  75 mcg Oral Q0600   pantoprazole  40 mg Oral Daily   QUEtiapine  12.5 mg Oral QHS   vitamin B-12  1,000 mcg Oral Daily   Continuous Infusions:  lacosamide (VIMPAT) IV 150 mg (03/22/21 1009)   levETIRAcetam 1,500 mg (03/22/21 1157)    LOS: 3 days   Kerney Elbe, DO [facility] PAGER is on AMION  If 7PM-7AM, please contact night-coverage [email or web address]

## 2021-03-23 ENCOUNTER — Inpatient Hospital Stay (HOSPITAL_COMMUNITY): Payer: Medicare Other

## 2021-03-23 DIAGNOSIS — R9431 Abnormal electrocardiogram [ECG] [EKG]: Secondary | ICD-10-CM

## 2021-03-23 DIAGNOSIS — I4891 Unspecified atrial fibrillation: Secondary | ICD-10-CM

## 2021-03-23 DIAGNOSIS — I482 Chronic atrial fibrillation, unspecified: Secondary | ICD-10-CM | POA: Diagnosis not present

## 2021-03-23 LAB — ECHOCARDIOGRAM COMPLETE
Area-P 1/2: 5.13 cm2
Calc EF: 61.2 %
Height: 72 in
P 1/2 time: 723 msec
S' Lateral: 2.7 cm
Single Plane A2C EF: 62.8 %
Single Plane A4C EF: 63.6 %
Weight: 2416 oz

## 2021-03-23 LAB — COMPREHENSIVE METABOLIC PANEL
ALT: <5 U/L (ref 0–44)
AST: 13 U/L — ABNORMAL LOW (ref 15–41)
Albumin: 3.5 g/dL (ref 3.5–5.0)
Alkaline Phosphatase: 78 U/L (ref 38–126)
Anion gap: 6 (ref 5–15)
BUN: 23 mg/dL (ref 8–23)
CO2: 29 mmol/L (ref 22–32)
Calcium: 8.9 mg/dL (ref 8.9–10.3)
Chloride: 100 mmol/L (ref 98–111)
Creatinine, Ser: 0.93 mg/dL (ref 0.61–1.24)
GFR, Estimated: >60 mL/min (ref >60)
Glucose, Bld: 118 mg/dL — ABNORMAL HIGH (ref 70–99)
Potassium: 4 mmol/L (ref 3.5–5.1)
Sodium: 135 mmol/L (ref 135–145)
Total Bilirubin: 0.7 mg/dL (ref 0.3–1.2)
Total Protein: 6.9 g/dL (ref 6.5–8.1)

## 2021-03-23 LAB — GLUCOSE, CAPILLARY
Glucose-Capillary: 103 mg/dL — ABNORMAL HIGH (ref 70–99)
Glucose-Capillary: 104 mg/dL — ABNORMAL HIGH (ref 70–99)
Glucose-Capillary: 85 mg/dL (ref 70–99)
Glucose-Capillary: 88 mg/dL (ref 70–99)
Glucose-Capillary: 119 mg/dL — ABNORMAL HIGH (ref 70–99)

## 2021-03-23 LAB — CBC WITH DIFFERENTIAL/PLATELET
Abs Immature Granulocytes: 0.02 10*3/uL (ref 0.00–0.07)
Basophils Absolute: 0 10*3/uL (ref 0.0–0.1)
Basophils Relative: 0 %
Eosinophils Absolute: 0.3 10*3/uL (ref 0.0–0.5)
Eosinophils Relative: 3 %
HCT: 44.9 % (ref 39.0–52.0)
Hemoglobin: 14.7 g/dL (ref 13.0–17.0)
Immature Granulocytes: 0 %
Lymphocytes Relative: 29 %
Lymphs Abs: 2.2 10*3/uL (ref 0.7–4.0)
MCH: 29.3 pg (ref 26.0–34.0)
MCHC: 32.7 g/dL (ref 30.0–36.0)
MCV: 89.4 fL (ref 80.0–100.0)
Monocytes Absolute: 0.7 10*3/uL (ref 0.1–1.0)
Monocytes Relative: 9 %
Neutro Abs: 4.2 10*3/uL (ref 1.7–7.7)
Neutrophils Relative %: 59 %
Platelets: 237 10*3/uL (ref 150–400)
RBC: 5.02 MIL/uL (ref 4.22–5.81)
RDW: 12.7 % (ref 11.5–15.5)
WBC: 7.3 10*3/uL (ref 4.0–10.5)
nRBC: 0 % (ref 0.0–0.2)

## 2021-03-23 LAB — RESP PANEL BY RT-PCR (FLU A&B, COVID) ARPGX2
Influenza A by PCR: NEGATIVE
Influenza B by PCR: NEGATIVE
SARS Coronavirus 2 by RT PCR: NEGATIVE

## 2021-03-23 LAB — PHOSPHORUS: Phosphorus: 3.3 mg/dL (ref 2.5–4.6)

## 2021-03-23 LAB — MAGNESIUM: Magnesium: 1.9 mg/dL (ref 1.7–2.4)

## 2021-03-23 NOTE — Care Management Important Message (Signed)
Important Message  Patient Details IM Letter given to the Patient. Name: Gerald Leach MRN: [id number] Date of Birth: 1934-03-05   Medicare Important Message Given:  Yes     Kerin Salen 03/23/2021, 1:13 PM

## 2021-03-23 NOTE — TOC Progression Note (Signed)
Transition of Care [facility]) - Progression Note    Patient Details  Name: Gerald Leach MRN: [id number] Date of Birth: 20-Dec-1933  Transition of Care [facility]) CM/SW Contact  Ross Ludwig, [place] Phone Number: 03/23/2021, 4:08 PM  Clinical Narrative:     CSW spoke to patient's son, he confirmed that they would like patient to go to [facility] for short term rehab.  CSW spoke to [facility], they can accept patient tomorrow if he is medically ready for discharge.  CSW updated attending physician and bedside nurse.  CSW requested a new covid test to be completed before patient can discharge to SNF.  Patient's insurance authorization is valid till 5pm tomorrow Friday 03/24/21.  Expected Discharge Plan: [place] Barriers to Discharge: Continued Medical Work up  Expected Discharge Plan and Services Expected Discharge Plan: [place]   Discharge Planning Services: CM Consult   Living arrangements for the past 2 months: Single Family Home                                       Social Determinants of Health (SDOH) Interventions    Readmission Risk Interventions Readmission Risk Prevention Plan 08/12/2020  Post Dischage Appt Complete  Medication Screening Complete  Transportation Screening Complete  Some recent data might be hidden

## 2021-03-23 NOTE — Progress Notes (Signed)
  Echocardiogram 2D Echocardiogram has been performed.  Bobbye Charleston 03/23/2021, 3:43 PM

## 2021-03-23 NOTE — Progress Notes (Signed)
Progress Note  Patient Name: Gerald Leach Date of Encounter: 03/23/2021  [facility] Cardiologist: None   Subjective   Patient awake, answers questions   Moreresponsive than yesterday No CP  No SOB  No palpitations   Inpatient Medications    Scheduled Meds:  amantadine  100 mg Oral Daily   carbidopa-levodopa  1 tablet Oral TID   insulin aspart  0-6 Units Subcutaneous TID WC   levothyroxine  75 mcg Oral Q0600   pantoprazole  40 mg Oral Daily   QUEtiapine  12.5 mg Oral QHS   vitamin B-12  1,000 mcg Oral Daily   Continuous Infusions:  lacosamide (VIMPAT) IV 150 mg (03/23/21 1053)   levETIRAcetam 1,500 mg (03/22/21 2253)   PRN Meds: acetaminophen **OR** acetaminophen, ondansetron **OR** ondansetron (ZOFRAN) IV   Vital Signs    Vitals:   03/21/21 2011 03/22/21 0354 03/22/21 2014 03/23/21 0352  BP: 138/78 (!) 156/90 (!) 148/88 (!) 157/81  Pulse: (!) 55 (!) 55 73 61  Resp: 16 16 18 16   Temp: 98 F (36.7 C) 97.8 F (36.6 C) 97.7 F (36.5 C) 98.2 F (36.8 C)  TempSrc: Oral Oral Oral   SpO2: 98% 97% 98% 100%  Weight:      Height:        Intake/Output Summary (Last 24 hours) at 03/23/2021 1112 Last data filed at 03/23/2021 0100 Gross per 24 hour  Intake 177.77 ml  Output --  Net 177.77 ml   Last 3 Weights 03/18/2021 03/17/2021 12/09/2020  Weight (lbs) 151 lb 160 lb 165 lb  Weight (kg) 68.493 kg 72.576 kg 74.844 kg  Some encounter information is confidential and restricted. Go to Review Flowsheets activity to see all data.      Telemetry    Afib   40s / 50s   - Personally Reviewed  ECG    No new   - Personally Reviewed  Physical Exam   GEN: Thin, elderly 87 yo acute distress.   Neck: No JVD Cardiac: Irreg irreg  No S3  No significant murmurs   Respiratory: Clear to auscultation bilaterally. GI: Soft, nontender, non-distended  MS: No edema; No deformity.    Labs    High Sensitivity Troponin:   Recent Labs  Lab 03/22/21 1121 03/22/21 1319   TROPONINIHS 4 4     Chemistry Recent Labs  Lab 03/19/21 0355 03/22/21 0857 03/23/21 0356  NA 135 141 135  K 4.4 4.3 4.0  CL 96* 104 100  CO2 27 29 29   GLUCOSE 110* 104* 118*  BUN 15 19 23   CREATININE 0.72 0.52* 0.93  CALCIUM 9.5 8.9 8.9  MG  --  1.9 1.9  PROT 7.7 6.9 6.9  ALBUMIN 4.0 3.5 3.5  AST 16 13* 13*  ALT 8 7 <5  ALKPHOS 82 70 78  BILITOT 0.9 0.7 0.7  GFRNONAA >60 >60 >60  ANIONGAP 12 8 6     Lipids No results for input(s): CHOL, TRIG, HDL, LABVLDL, LDLCALC, CHOLHDL in the last 168 hours.  Hematology Recent Labs  Lab 03/19/21 0355 03/22/21 0857 03/23/21 0356  WBC 9.7 7.9 7.3  RBC 5.28 4.99 5.02  HGB 15.3 14.7 14.7  HCT 46.5 44.1 44.9  MCV 88.1 88.4 89.4  MCH 29.0 29.5 29.3  MCHC 32.9 33.3 32.7  RDW 12.7 12.9 12.7  PLT 270 232 237   Thyroid  Recent Labs  Lab 03/22/21 1512 03/22/21 1916  TSH 5.180*  --   FREET4  --  0.95  BNPNo results for input(s): BNP, PROBNP in the last 168 hours.  DDimer No results for input(s): DDIMER in the last 168 hours.   Radiology    No results found.  Cardiac Studies   Echo- Ordered    Patient Profile     Gerald Leach is a 87 y.o. male with a hx of DM II, chronic atrial fibrillation, hypothyroidism and seizures who is being seen 03/22/2021 for the evaluation of atrial fibrillation at the request of Dr. Alfredia Ferguson.  Assessment & Plan    1  Chronic atrial fibrillation   Patient wht permanent atrial fib  Not new   Not on rate control  No anticoagulation given  fall risk Echo is pending    BP OK  HR is lower  but no significant pause  (Conduction disease at 87 yo) Will review echo  No new recommm .  Would plan conservative Rx    Again,unless he is supervised 100% would not recomm anticoagulation.    2  Hx seizures   On medical RX  Followed by neuro     For questions or updates, please contact [facility] Please consult [email or web address] for contact info under        Signed, Dorris Carnes, MD  03/23/2021,  11:12 AM

## 2021-03-23 NOTE — Progress Notes (Signed)
PROGRESS NOTE    Gerald Leach  EUM:[id number] DOB: 01-Mar-1934 DOA: 03/17/2021 PCP: [name], NP   Brief Narrative:  The patient is an 87 year old Caucasian male with a past medical history significant for but not limited to seizure disorder, hypertension, hypothyroidism, dementia as well as other comorbidities including history of atrial fibrillation who presented to the hospital with metabolic encephalopathy.  Apparently he had a witnessed seizure and this was seen by his family and he fell to the ground and hit his head.  The seizure lasted less than a couple minutes and he was brought to the emergency room.  In the ED neurology was curb sided and the plan was to increase his Vimpat and have the patient go back home and follow-up with his neurologist but he was unable to be transported by the family due to weather and while in the ED he had a second seizure and then subsequently was admitted to the hospital.  He was admitted for breakthrough seizure and at home the patient takes Vimpat 100 mg p.o. twice daily and Keppra 1500 mL p.o. twice daily and neurology was consulted and recommended increasing the Vimpat 250 mg p.o. twice daily.  His p.o. intake has been intermittent and in the setting of his delirium we continued his IV agents for now but daughter had mentioned that he takes his pills and takes his pills at rehab when he was there in the past.  He was also found to be in A. fib that is rate controlled and cardiology was consulted and recommended checking an echocardiogram as well as a TSH. ECHO is still pending to be done but TSH was done and was 5.180 and T4 was 0.95. Given his history of falls he may not be the best candidate for anticoagulation.  Currently awaiting cardiology clearance and placement at SNF.  Assessment & Plan:   Active Problems:   Seizure ([place])   Breakthrough seizure ([place])  Breakthrough seizures -Admitted after his second witnessed seizure the second 1 being in the  ED -At home the patient takes Vimpat 100 mg p.o. twice daily and Keppra 1500 mg p.o. twice daily -Neurology was consulted in the ED and recommended increasing the Vimpat 250 mg p.o. twice daily -Unfortunately the patient has been refusing p.o. intake in setting of his delirium and dementia -We have continued his IV agents for now but his daughter mentioned that at home he takes his pills and he has been taking pills in rehab while he was there in the past so we will transition to p.o. prior to discharge; He refused oral meds this AM but then took them this afternoon -He will need to follow-up with his primary neurologist [name] in the outpatient setting  Acute metabolic encephalopathy with history of underlying Parkinson's with dementia -Patient has had worsening encephalopathy likely hospital delirium on his underlying -We will continue supportive treatments and continue his home medications including amantadine 100 mg p.o. daily and carbidopa-levodopa 25-100 mg/tab. 1 tab p.o. 3 times daily as well as quetiapine 12.5 mg p.o. nightly -Following history of seizures he became extremely deconditioned and he is worked with physical therapy and they recommended rehab which the family is in agreement with -He is improved from the standpoint and SNF bed is available and insurance authorization is obtained however we need to make sure that he is stable from a cardiac perspective prior to discharging  Chronic Atrial Fibrillation with slight Bradycardia -Remains rate controlled without medication  -Troponins checked x2  and were flat at 4 -Cardiology has been consulted given his a flutter A. Fib - while hospitalization and they are recommending checking a TSH and an echocardiogram -ECHO pending to be done -TSH was 5.180 and Free T4 was 0.95 -Cardiology is hesitant to start the patient on anticoagulation given that he may not be a candidate for anticoagulation given that he lives alone and has recurrent  falls and they feel that unless he goes a supervised center with no unsupervised activity then they would be reluctant to start -He has a CHA2DS2-VASc score of 4 given his age and his diabetes history as well as hypertension history -Cardiology recommends conservative Treatment and again feels that unless he is 100% Supervised they would not recommend Anticoagulation.   Hypothyroidism --TSH was 5.180 and Free T4 was 0.95 -C/w Home Levothyroxine 75 mcg for now  Diabetes Mellitus Type 2 -He takes metformin at home -His last A1c was 5.6 back in February 2022 -Continue monitor CBGs per protocol; CBGs ranging from 96-143 -Continue with very sensitive NovoLog sliding scale insulin AC  GERD/GI prophylaxis -Continue with Pantoprazole 40 mg p.o. daily  DVT prophylaxis: SCDs Code Status: FULL CODE Family Communication: No Family currently at bedside Disposition Plan: SNF once cleared from a cardiology perspective  Status is: Inpatient  Remains inpatient appropriate because:Unsafe d/c plan, IV treatments appropriate due to intensity of illness or inability to take PO, and Inpatient level of care appropriate due to severity of illness  Dispo: The patient is from: Home              Anticipated d/c is to: SNF              Patient currently is not medically stable to d/c.   Difficult to place patient No  Consultants:  Cardiology  Procedures: ECHOCARDIOGRAM pending to be done   Antimicrobials:  Anti-infectives (From admission, onward)    None        Subjective: Seen and examined at bedside and he is more awake and alert this afternoon.  Refuses medications early this morning.  No chest pain or shortness of breath.  Eating his lunch.  Has no issues and still awaiting echocardiogram to be done.  Likely once that is done and ready to be discharged to skilled nursing facility.  Objective: Vitals:   03/21/21 2011 03/22/21 0354 03/22/21 2014 03/23/21 0352  BP: 138/78 (!) 156/90 (!) 148/88  (!) 157/81  Pulse: (!) 55 (!) 55 73 61  Resp: 16 16 18 16   Temp: 98 F (36.7 C) 97.8 F (36.6 C) 97.7 F (36.5 C) 98.2 F (36.8 C)  TempSrc: Oral Oral Oral   SpO2: 98% 97% 98% 100%  Weight:      Height:        Intake/Output Summary (Last 24 hours) at 03/23/2021 1433 Last data filed at 03/23/2021 0100 Gross per 24 hour  Intake 177.77 ml  Output --  Net 177.77 ml    Filed Weights   03/17/21 1642 03/18/21 1700  Weight: 72.6 kg 68.5 kg   Examination: Physical Exam:  Constitutional: Elderly thin Caucasian male currently in no acute distress Eyes: PERRL, lids and conjunctivae normal, sclerae anicteric  ENMT: External Ears, Nose appear normal. Grossly normal hearing. Mucous membranes are moist. Posterior pharynx clear of any exudate or lesions. Normal dentition.  Neck: Appears normal, supple, no cervical masses, normal ROM, no appreciable thyromegaly Respiratory: Diminished to auscultation bilaterally with coarse breath sounds, no wheezing, rales, rhonchi or crackles. Normal respiratory  effort and patient is not tachypenic. No accessory muscle use and has unlabored breathing.  Cardiovascular: Irregularly irregular and slightly bradycardic, no murmurs / rubs / gallops. S1 and S2 auscultated.  Minimal extremity edema Abdomen: Soft, non-tender, non-distended. Bowel sounds positive.  GU: Deferred. Musculoskeletal: No clubbing / cyanosis of digits/nails. No joint deformity upper and lower extremities.  Skin: No rashes, lesions, ulcers on limited skin evaluation. No induration; Warm and dry.  Neurologic: CN 2-12 grossly intact with no focal deficits. Romberg sign and cerebellar reflexes not assessed.  Psychiatric: Impaired judgment and insight.  He is awake and alert but not fully oriented x 3. Normal mood and appropriate affect.   Data Reviewed: I have personally reviewed following labs and imaging studies  CBC: Recent Labs  Lab 03/17/21 1755 03/18/21 1242 03/19/21 0355  03/22/21 0857 03/23/21 0356  WBC 9.9 7.6 9.7 7.9 7.3  NEUTROABS 6.7 4.9  --  4.6 4.2  HGB 15.0 14.6 15.3 14.7 14.7  HCT 45.7 44.4 46.5 44.1 44.9  MCV 90.0 89.2 88.1 88.4 89.4  PLT 262 277 270 232 606    Basic Metabolic Panel: Recent Labs  Lab 03/17/21 1755 03/18/21 1242 03/19/21 0355 03/22/21 0857 03/23/21 0356  NA 138 136 135 141 135  K 4.4 3.9 4.4 4.3 4.0  CL 98 98 96* 104 100  CO2 27 28 27 29 29   GLUCOSE 90 114* 110* 104* 118*  BUN 14 13 15 19 23   CREATININE 0.75 0.63 0.72 0.52* 0.93  CALCIUM 9.4 8.8* 9.5 8.9 8.9  MG  --   --   --  1.9 1.9  PHOS  --   --   --  3.4 3.3    GFR: Estimated Creatinine Clearance: 56.3 mL/min (by C-G formula based on SCr of 0.93 mg/dL). Liver Function Tests: Recent Labs  Lab 03/17/21 1755 03/18/21 1242 03/19/21 0355 03/22/21 0857 03/23/21 0356  AST 14* 16 16 13* 13*  ALT 9 11 8 7  <5  ALKPHOS 85 81 82 70 78  BILITOT 0.8 1.1 0.9 0.7 0.7  PROT 8.1 7.4 7.7 6.9 6.9  ALBUMIN 4.2 3.7 4.0 3.5 3.5    No results for input(s): LIPASE, AMYLASE in the last 168 hours. No results for input(s): AMMONIA in the last 168 hours. Coagulation Profile: No results for input(s): INR, PROTIME in the last 168 hours. Cardiac Enzymes: No results for input(s): CKTOTAL, CKMB, CKMBINDEX, TROPONINI in the last 168 hours. BNP (last 3 results) No results for input(s): PROBNP in the last 8760 hours. HbA1C: No results for input(s): HGBA1C in the last 72 hours. CBG: Recent Labs  Lab 03/22/21 1142 03/22/21 1637 03/23/21 0354 03/23/21 0858 03/23/21 1154  GLUCAP 143* 99 103* 104* 85    Lipid Profile: No results for input(s): CHOL, HDL, LDLCALC, TRIG, CHOLHDL, LDLDIRECT in the last 72 hours. Thyroid Function Tests: Recent Labs    03/22/21 1512 03/22/21 1916  TSH 5.180*  --   FREET4  --  0.95   Anemia Panel: No results for input(s): VITAMINB12, FOLATE, FERRITIN, TIBC, IRON, RETICCTPCT in the last 72 hours. Sepsis Labs: No results for input(s):  PROCALCITON, LATICACIDVEN in the last 168 hours.  Recent Results (from the past 240 hour(s))  SARS CORONAVIRUS 2 (TAT 6-24 HRS) Nasopharyngeal Nasopharyngeal Swab     Status: None   Collection Time: 03/18/21  6:16 AM   Specimen: Nasopharyngeal Swab  Result Value Ref Range Status   SARS Coronavirus 2 NEGATIVE NEGATIVE Final    Comment: (NOTE) SARS-CoV-2  target nucleic acids are NOT DETECTED.  The SARS-CoV-2 RNA is generally detectable in upper and lower respiratory specimens during the acute phase of infection. Negative results do not preclude SARS-CoV-2 infection, do not rule out co-infections with other pathogens, and should not be used as the sole basis for treatment or other patient management decisions. Negative results must be combined with clinical observations, patient history, and epidemiological information. The expected result is Negative.  Fact Sheet for Patients: SugarRoll.be  Fact Sheet for Healthcare Providers: https://www.woods-mathews.com/  This test is not yet approved or cleared by the [place] FDA and  has been authorized for detection and/or diagnosis of SARS-CoV-2 by FDA under an Emergency Use Authorization (EUA). This EUA will remain  in effect (meaning this test can be used) for the duration of the COVID-19 declaration under Se ction 564(b)(1) of the Act, 21 U.S.C. section 360bbb-3(b)(1), unless the authorization is terminated or revoked sooner.  Performed at [facility], [place] [address], [place], [place] [address]      RN Pressure Injury Documentation:     Estimated body mass index is 20.48 kg/m as calculated from the following:   Height as of this encounter: 6' (1.829 m).   Weight as of this encounter: 68.5 kg.  Malnutrition Type:   Malnutrition Characteristics:   Nutrition Interventions:    Radiology Studies: No results found.  Scheduled Meds:  amantadine  100 mg Oral Daily    carbidopa-levodopa  1 tablet Oral TID   insulin aspart  0-6 Units Subcutaneous TID WC   levothyroxine  75 mcg Oral Q0600   pantoprazole  40 mg Oral Daily   QUEtiapine  12.5 mg Oral QHS   vitamin B-12  1,000 mcg Oral Daily   Continuous Infusions:  lacosamide (VIMPAT) IV 150 mg (03/23/21 1053)   levETIRAcetam 1,500 mg (03/23/21 1204)    LOS: 4 days   Kerney Elbe, DO [facility] PAGER is on AMION  If 7PM-7AM, please contact night-coverage [email or web address]

## 2021-03-24 DIAGNOSIS — R001 Bradycardia, unspecified: Secondary | ICD-10-CM | POA: Diagnosis not present

## 2021-03-24 DIAGNOSIS — I10 Essential (primary) hypertension: Secondary | ICD-10-CM | POA: Diagnosis not present

## 2021-03-24 DIAGNOSIS — R4781 Slurred speech: Secondary | ICD-10-CM | POA: Diagnosis not present

## 2021-03-24 DIAGNOSIS — K219 Gastro-esophageal reflux disease without esophagitis: Secondary | ICD-10-CM | POA: Diagnosis not present

## 2021-03-24 DIAGNOSIS — R262 Difficulty in walking, not elsewhere classified: Secondary | ICD-10-CM | POA: Diagnosis not present

## 2021-03-24 DIAGNOSIS — R569 Unspecified convulsions: Secondary | ICD-10-CM | POA: Diagnosis not present

## 2021-03-24 DIAGNOSIS — G40919 Epilepsy, unspecified, intractable, without status epilepticus: Secondary | ICD-10-CM | POA: Diagnosis not present

## 2021-03-24 DIAGNOSIS — Z743 Need for continuous supervision: Secondary | ICD-10-CM | POA: Diagnosis not present

## 2021-03-24 DIAGNOSIS — E871 Hypo-osmolality and hyponatremia: Secondary | ICD-10-CM | POA: Diagnosis not present

## 2021-03-24 DIAGNOSIS — G20 Parkinson's disease: Secondary | ICD-10-CM | POA: Diagnosis not present

## 2021-03-24 DIAGNOSIS — R2689 Other abnormalities of gait and mobility: Secondary | ICD-10-CM | POA: Diagnosis not present

## 2021-03-24 DIAGNOSIS — E039 Hypothyroidism, unspecified: Secondary | ICD-10-CM | POA: Diagnosis not present

## 2021-03-24 DIAGNOSIS — G9341 Metabolic encephalopathy: Secondary | ICD-10-CM | POA: Diagnosis not present

## 2021-03-24 DIAGNOSIS — R5381 Other malaise: Secondary | ICD-10-CM | POA: Diagnosis not present

## 2021-03-24 DIAGNOSIS — I69828 Other speech and language deficits following other cerebrovascular disease: Secondary | ICD-10-CM | POA: Diagnosis not present

## 2021-03-24 DIAGNOSIS — I482 Chronic atrial fibrillation, unspecified: Secondary | ICD-10-CM | POA: Diagnosis not present

## 2021-03-24 DIAGNOSIS — Z03818 Encounter for observation for suspected exposure to other biological agents ruled out: Secondary | ICD-10-CM | POA: Diagnosis not present

## 2021-03-24 DIAGNOSIS — M6281 Muscle weakness (generalized): Secondary | ICD-10-CM | POA: Diagnosis not present

## 2021-03-24 DIAGNOSIS — F02818 Dementia in other diseases classified elsewhere, unspecified severity, with other behavioral disturbance: Secondary | ICD-10-CM | POA: Diagnosis not present

## 2021-03-24 DIAGNOSIS — E119 Type 2 diabetes mellitus without complications: Secondary | ICD-10-CM | POA: Diagnosis not present

## 2021-03-24 DIAGNOSIS — E785 Hyperlipidemia, unspecified: Secondary | ICD-10-CM | POA: Diagnosis not present

## 2021-03-24 LAB — COMPREHENSIVE METABOLIC PANEL
ALT: <5 U/L (ref 0–44)
AST: 16 U/L (ref 15–41)
Albumin: 3.6 g/dL (ref 3.5–5.0)
Alkaline Phosphatase: 82 U/L (ref 38–126)
Anion gap: 7 (ref 5–15)
BUN: 23 mg/dL (ref 8–23)
CO2: 26 mmol/L (ref 22–32)
Calcium: 9 mg/dL (ref 8.9–10.3)
Chloride: 100 mmol/L (ref 98–111)
Creatinine, Ser: 0.71 mg/dL (ref 0.61–1.24)
GFR, Estimated: >60 mL/min (ref >60)
Glucose, Bld: 97 mg/dL (ref 70–99)
Potassium: 4.7 mmol/L (ref 3.5–5.1)
Sodium: 133 mmol/L — ABNORMAL LOW (ref 135–145)
Total Bilirubin: 0.8 mg/dL (ref 0.3–1.2)
Total Protein: 7 g/dL (ref 6.5–8.1)

## 2021-03-24 LAB — CBC WITH DIFFERENTIAL/PLATELET
Abs Immature Granulocytes: 0.02 10*3/uL (ref 0.00–0.07)
Basophils Absolute: 0.1 10*3/uL (ref 0.0–0.1)
Basophils Relative: 1 %
Eosinophils Absolute: 0.3 10*3/uL (ref 0.0–0.5)
Eosinophils Relative: 3 %
HCT: 46.2 % (ref 39.0–52.0)
Hemoglobin: 15.4 g/dL (ref 13.0–17.0)
Immature Granulocytes: 0 %
Lymphocytes Relative: 31 %
Lymphs Abs: 3 10*3/uL (ref 0.7–4.0)
MCH: 29.4 pg (ref 26.0–34.0)
MCHC: 33.3 g/dL (ref 30.0–36.0)
MCV: 88.3 fL (ref 80.0–100.0)
Monocytes Absolute: 1.3 10*3/uL — ABNORMAL HIGH (ref 0.1–1.0)
Monocytes Relative: 14 %
Neutro Abs: 4.9 10*3/uL (ref 1.7–7.7)
Neutrophils Relative %: 51 %
Platelets: 242 10*3/uL (ref 150–400)
RBC: 5.23 MIL/uL (ref 4.22–5.81)
RDW: 12.8 % (ref 11.5–15.5)
WBC: 9.6 10*3/uL (ref 4.0–10.5)
nRBC: 0 % (ref 0.0–0.2)

## 2021-03-24 LAB — GLUCOSE, CAPILLARY
Glucose-Capillary: 93 mg/dL (ref 70–99)
Glucose-Capillary: 89 mg/dL (ref 70–99)
Glucose-Capillary: 112 mg/dL — ABNORMAL HIGH (ref 70–99)
Glucose-Capillary: 110 mg/dL — ABNORMAL HIGH (ref 70–99)

## 2021-03-24 LAB — MAGNESIUM: Magnesium: 2 mg/dL (ref 1.7–2.4)

## 2021-03-24 LAB — T3: T3, Total: 75 ng/dL (ref 71–180)

## 2021-03-24 LAB — PHOSPHORUS: Phosphorus: 3.6 mg/dL (ref 2.5–4.6)

## 2021-03-24 MED ORDER — LACOSAMIDE 150 MG PO TABS: 150.0000 mg | ORAL_TABLET | Freq: Two times a day (BID) | ORAL | Status: DC

## 2021-03-24 MED ORDER — LEVETIRACETAM 500 MG PO TABS
1500.0000 mg | ORAL_TABLET | Freq: Two times a day (BID) | ORAL | Status: DC
  Administered 2021-03-24 (×2): 1500 mg via ORAL
  Filled 2021-03-24 (×2): qty 3

## 2021-03-24 MED ORDER — ACETAMINOPHEN 325 MG PO TABS: 650.0000 mg | ORAL_TABLET | Freq: Four times a day (QID) | ORAL | Status: DC | PRN

## 2021-03-24 MED ORDER — LACOSAMIDE 50 MG PO TABS
150.0000 mg | ORAL_TABLET | Freq: Two times a day (BID) | ORAL | Status: DC
  Administered 2021-03-24 (×2): 150 mg via ORAL
  Filled 2021-03-24 (×2): qty 3

## 2021-03-24 MED ORDER — ONDANSETRON HCL 4 MG PO TABS: 4.0000 mg | ORAL_TABLET | Freq: Four times a day (QID) | ORAL | 0 refills | Status: DC | PRN

## 2021-03-24 MED ORDER — QUETIAPINE FUMARATE 25 MG PO TABS
12.5000 mg | ORAL_TABLET | Freq: Every day | ORAL | 0 refills | Status: DC
Start: 2021-03-24 — End: 2021-09-03

## 2021-03-24 NOTE — TOC Transition Note (Signed)
Transition of Care [facility]) - CM/SW Discharge Note   Patient Details  Name: Gerald Leach MRN: [id number] Date of Birth: 03-Mar-1934  Transition of Care [facility]) CM/SW Contact:  Ross Ludwig, LCSW Phone Number: 03/24/2021, 1:55 PM   Clinical Narrative:     Patient to be d/c'ed today to [facility] room 421.  Patient and family agreeable to plans will transport via ems RN to call report [phone].  Patient's son is aware that patient is discharging today.    Final next level of care: Skilled Nursing Facility Barriers to Discharge: Barriers Resolved   Patient Goals and CMS Choice Patient states their goals for this hospitalization and ongoing recovery are:: To go to SNF for short term rehab CMS Medicare.gov Compare Post Acute Care list provided to:: Patient Represenative (must comment) Choice offered to / list presented to : Adult Children  Discharge Placement PASRR number recieved: 03/21/21            Patient chooses bed at: [facility] Patient to be transferred to facility by: [facility] Name of family member notified: [name] patient's son Patient and family notified of of transfer: 03/24/21  Discharge Plan and Services   Discharge Planning Services: CM Consult                                 Social Determinants of Health ([place]) Interventions     Readmission Risk Interventions Readmission Risk Prevention Plan 08/12/2020  Post Dischage Appt Complete  Medication Screening Complete  Transportation Screening Complete  Some recent data might be hidden

## 2021-03-24 NOTE — Discharge Summary (Signed)
Physician Discharge Summary  Gerald Leach PJK:[id number] DOB: 1933-08-04 DOA: 03/17/2021  PCP: [name], NP  Admit date: 03/17/2021 Discharge date: 03/24/2021  Admitted From: Home Disposition: SNF  Recommendations for Outpatient Follow-up:  Follow up with PCP in 1-2 weeks Follow up with Neurology within 1-2 weeks  Follow up with Cardiology as an outpatient within 1-2 weeks  Please obtain CMP/CBC, Mag, Phos in one week Please follow up on the following pending results:  Home Health: No  Equipment/Devices: None    Discharge Condition: Stable  CODE STATUS: FULL CODE  Diet recommendation: Carb Modified Diet   Brief/Interim Summary: The patient is an 87 year old Caucasian male with a past medical history significant for but not limited to seizure disorder, hypertension, hypothyroidism, dementia as well as other comorbidities including history of atrial fibrillation who presented to the hospital with metabolic encephalopathy.  Apparently he had a witnessed seizure and this was seen by his family and he fell to the ground and hit his head.  The seizure lasted less than a couple minutes and he was brought to the emergency room.  In the ED neurology was curb sided and the plan was to increase his Vimpat and have the patient go back home and follow-up with his neurologist but he was unable to be transported by the family due to weather and while in the ED he had a second seizure and then subsequently was admitted to the hospital.  He was admitted for breakthrough seizure and at home the patient takes Vimpat 100 mg p.o. twice daily and Keppra 1500 mL p.o. twice daily and neurology was consulted and recommended increasing the Vimpat 250 mg p.o. twice daily.  His p.o. intake has been intermittent and in the setting of his delirium we continued his IV agents for now but daughter had mentioned that he takes his pills and takes his pills at rehab when he was there in the past.  He was also found to be in  A. fib that is rate controlled and cardiology was consulted and recommended checking an echocardiogram as well as a TSH. ECHO is still pending to be done but TSH was done and was 5.180 and T4 was 0.95. Given his history of falls he may not be the best candidate for anticoagulation and Cardiology agrees not to Anticoagulate the Patient. He is medically stable to D/C and follow up with PCP, Neurology, and Cardiology in the outpatient setting.   Discharge Diagnoses:  Active Problems:   Seizure ([place])   Breakthrough seizure ([place])  Breakthrough seizures -Admitted after his second witnessed seizure the second 1 being in the ED -At home the patient takes Vimpat 100 mg p.o. twice daily and Keppra 1500 mg p.o. twice daily -Neurology was consulted in the ED and recommended increasing the Vimpat 250 mg p.o. twice daily -Unfortunately the patient has been refusing p.o. intake in setting of his delirium and dementia -We have continued his IV agents for now but his daughter mentioned that at home he takes his pills and he has been taking pills in rehab while he was there in the past so we will transition to p.o. prior to discharge; He refused oral meds this AM but then took them this afternoon -He will need to follow-up with his primary neurologist [name] in the outpatient setting   Acute metabolic encephalopathy with history of underlying Parkinson's with dementia -Patient has had worsening encephalopathy likely hospital delirium on his underlying -We will continue supportive treatments and continue his home  medications including amantadine 100 mg p.o. daily and carbidopa-levodopa 25-100 mg/tab. 1 tab p.o. 3 times daily as well as quetiapine 12.5 mg p.o. nightly -Following history of seizures he became extremely deconditioned and he is worked with physical therapy and they recommended rehab which the family is in agreement with -He is improved from the standpoint and SNF bed is available and insurance  authorization is obtained however we need to make sure that he is stable from a cardiac perspective prior to discharging  Hyponatremia -Mild. Na+ has gone from 135 -> 133 -Continue to Monitor and Trend  -Repeat CMP in the AM    Chronic Atrial Fibrillation with slight Bradycardia -Remains rate controlled without medication  -Troponins checked x2 and were flat at 4 -Cardiology has been consulted given his a flutter A. Fib - while hospitalization and they are recommending checking a TSH and an echocardiogram -ECHO pending to be done -TSH was 5.180 and Free T4 was 0.95 -Cardiology is hesitant to start the patient on anticoagulation given that he may not be a candidate for anticoagulation given that he lives alone and has recurrent falls and they feel that unless he goes a supervised center with no unsupervised activity then they would be reluctant to start -He has a CHA2DS2-VASc score of 4 given his age and his diabetes history as well as hypertension history -Cardiology recommends conservative Treatment and again feels that unless he is 100% Supervised they would not recommend Anticoagulation and have recommended against it given his fall risk .    Hypothyroidism --TSH was 5.180 and Free T4 was 0.95 -C/w Home Levothyroxine 75 mcg for now   Diabetes Mellitus Type 2 -He takes metformin at home -His last A1c was 5.6 back in February 2022 -Continue monitor CBGs per protocol; CBGs ranging from 96-143 -Continue with very sensitive NovoLog sliding scale insulin AC   GERD/GI prophylaxis -Continue with Pantoprazole 40 mg p.o. daily   Discharge Instructions  Discharge Instructions     Call MD for:  difficulty breathing, headache or visual disturbances   Complete by: As directed    Call MD for:  extreme fatigue   Complete by: As directed    Call MD for:  hives   Complete by: As directed    Call MD for:  persistant dizziness or light-headedness   Complete by: As directed    Call MD for:   persistant nausea and vomiting   Complete by: As directed    Call MD for:  redness, tenderness, or signs of infection (pain, swelling, redness, odor or green/yellow discharge around incision site)   Complete by: As directed    Call MD for:  severe uncontrolled pain   Complete by: As directed    Call MD for:  temperature >100.4   Complete by: As directed    Diet - low sodium heart healthy   Complete by: As directed    Diet Carb Modified   Complete by: As directed    Discharge instructions   Complete by: As directed    You were cared for by a hospitalist during your hospital stay. If you have any questions about your discharge medications or the care you received while you were in the hospital after you are discharged, you can call the unit and ask to speak with the hospitalist on call if the hospitalist that took care of you is not available. Once you are discharged, your primary care physician will handle any further medical issues. Please note that NO REFILLS  for any discharge medications will be authorized once you are discharged, as it is imperative that you return to your primary care physician (or establish a relationship with a primary care physician if you do not have one) for your aftercare needs so that they can reassess your need for medications and monitor your lab values.  Follow up with PCP, Neurology, and Cardiology as an outpatient within 1-2 weeks. Take all medications as prescribed. If symptoms change or worsen please return to the ED for evaluation   Increase activity slowly   Complete by: As directed       Allergies as of 03/24/2021   No Known Allergies      Medication List     TAKE these medications    acetaminophen 325 MG tablet Commonly known as: TYLENOL Take 2 tablets (650 mg total) by mouth every 6 (six) hours as needed for mild pain (or Fever >/= 101).   amantadine 100 MG capsule Commonly known as: SYMMETREL TAKE 1 CAPSULE BY MOUTH EVERY DAY What  changed: how much to take   carbidopa-levodopa 25-100 MG tablet Commonly known as: SINEMET IR TAKE 1 TABLET BY MOUTH THREE TIMES A DAY   Lacosamide 150 MG Tabs Take 1 tablet (150 mg total) by mouth 2 (two) times daily. What changed:  medication strength how much to take   levETIRAcetam 750 MG tablet Commonly known as: KEPPRA TAKE 2 TABLETS (1,500 MG TOTAL) BY MOUTH 2 (TWO) TIMES DAILY.   levothyroxine 75 MCG tablet Commonly known as: SYNTHROID TAKE 1 TABLET BY MOUTH EVERY DAY   metFORMIN 500 MG tablet Commonly known as: GLUCOPHAGE TAKE 1 TABLET (500 MG TOTAL) BY MOUTH 2 (TWO) TIMES DAILY WITH A MEAL. NEED APPOINTMENT What changed: See the new instructions.   omeprazole 20 MG capsule Commonly known as: PRILOSEC TAKE 1 CAPSULE BY MOUTH EVERY DAY   ondansetron 4 MG tablet Commonly known as: ZOFRAN Take 1 tablet (4 mg total) by mouth every 6 (six) hours as needed for nausea.   QUEtiapine 25 MG tablet Commonly known as: SEROQUEL Take 0.5 tablets (12.5 mg total) by mouth at bedtime.   vitamin B-12 1000 MCG tablet Commonly known as: CYANOCOBALAMIN Take 1,000 mcg by mouth daily.        Follow-up Information     Alda Berthold, DO. Schedule an appointment as soon as possible for a visit .   Specialty: Neurology Contact information: [place] [address] [phone]         [name], NP. Call.   Specialty: Family Medicine Why: Follow up within 1 week Contact information: [place] [id number] [phone]         Fay Records, MD. Call.   Specialty: Cardiology Why: Follow up within 1-2 weeks Contact information: [place] [address] [phone]                No Known Allergies  Consultations: Cardiology  Procedures/Studies: CT HEAD WO CONTRAST (5MM)  Result Date: 03/17/2021 CLINICAL DATA:  Fall.  Possible seizure. EXAM: CT HEAD WITHOUT  CONTRAST TECHNIQUE: Contiguous axial images were obtained from the base of the skull through the vertex without intravenous contrast. COMPARISON:  CT head dated August 11, 2020. FINDINGS: Brain: No evidence of acute infarction, hemorrhage, hydrocephalus, extra-axial collection or mass lesion/mass effect. Stable atrophy and chronic microvascular ischemic changes. Vascular: Unchanged calcified supraclinoid right ICA aneurysm measuring 2.5 x 1.6 cm. No  hyperdense vessel. Skull: Prior right frontotemporal craniotomy. Negative for fracture or focal lesion. Sinuses/Orbits: No acute finding. Other: None. IMPRESSION: 1. No acute intracranial abnormality. 2. Stable atrophy and chronic microvascular ischemic changes. 3. Unchanged 2.5 cm calcified supraclinoid right ICA aneurysm. Electronically Signed   By: Titus Dubin M.D.   On: 03/17/2021 18:50   ECHOCARDIOGRAM COMPLETE  Result Date: 03/23/2021    ECHOCARDIOGRAM REPORT   Patient Name:   Gerald Leach Date of Exam: 03/23/2021 Medical Rec #:  [id number]      Height:       72.0 in Accession #:    [id number]     Weight:       151.0 lb Date of Birth:  07-04-1933       BSA:          1.891 m Patient Age:    37 years       BP:           157/81 mmHg Patient Gender: M              HR:           55 bpm. Exam Location:  Inpatient Procedure: 2D Echo, Cardiac Doppler and Color Doppler Indications:    R94.31 Abnormal EKG; I48.91* Unspeicified atrial fibrillation  History:        Patient has no prior history of Echocardiogram examinations.                 Abnormal ECG, Stroke, Signs/Symptoms:Syncope, Altered Mental                 Status and Alzheimer's; Risk Factors:Hypertension and                 Dyslipidemia. Seizure.  Sonographer:    Roseanna Rainbow RDCS Referring Phys: [id number] [facility] LATIF [facility]  Sonographer Comments: Technically difficult study due to poor echo windows and no subcostal window. IMPRESSIONS  1. Left ventricular ejection fraction, by estimation, is 65 to 70%. The left  ventricle has normal function. The left ventricle has no regional wall motion abnormalities. Left ventricular diastolic function could not be evaluated.  2. Right ventricular systolic function is moderately reduced. The right ventricular size is mildly enlarged.  3. Right atrial size was mildly dilated.  4. The mitral valve is grossly normal. Trivial mitral valve regurgitation.  5. The aortic valve is grossly normal. Aortic valve regurgitation is mild.  6. Aortic dilatation noted. There is mild dilatation of the ascending aorta, measuring 40 mm. FINDINGS  Left Ventricle: Left ventricular ejection fraction, by estimation, is 65 to 70%. The left ventricle has normal function. The left ventricle has no regional wall motion abnormalities. The left ventricular internal cavity size was normal in size. There is  no left ventricular hypertrophy. Left ventricular diastolic function could not be evaluated due to atrial fibrillation. Left ventricular diastolic function could not be evaluated. Right Ventricle: The right ventricular size is mildly enlarged. Right vetricular wall thickness was not well visualized. Right ventricular systolic function is moderately reduced. Left Atrium: Left atrial size was normal in size. Right Atrium: Right atrial size was mildly dilated. Pericardium: There is no evidence of pericardial effusion. Mitral Valve: The mitral valve is grossly normal. Trivial mitral valve regurgitation. Tricuspid Valve: The tricuspid valve is grossly normal. Tricuspid valve regurgitation is trivial. Aortic Valve: The aortic valve is grossly normal. Aortic valve regurgitation is mild. Aortic regurgitation PHT measures 723 msec. Pulmonic Valve: The pulmonic valve was grossly normal. Pulmonic valve  regurgitation is not visualized. Aorta: Aortic dilatation noted. There is mild dilatation of the ascending aorta, measuring 40 mm. IAS/Shunts: The atrial septum is grossly normal.  LEFT VENTRICLE PLAX 2D LVIDd:         4.10 cm  LVIDs:         2.70 cm LV PW:         1.10 cm LV IVS:        1.00 cm LVOT diam:     2.40 cm LV SV:         63 LV SV Index:   33 LVOT Area:     4.52 cm  LV Volumes (MOD) LV vol d, MOD A2C: 84.1 ml LV vol d, MOD A4C: 93.2 ml LV vol s, MOD A2C: 31.3 ml LV vol s, MOD A4C: 33.9 ml LV SV MOD A2C:     52.8 ml LV SV MOD A4C:     93.2 ml LV SV MOD BP:      54.4 ml RIGHT VENTRICLE RV S prime:     12.40 cm/s TAPSE (M-mode): 1.4 cm LEFT ATRIUM             Index        RIGHT ATRIUM           Index LA diam:        4.00 cm 2.12 cm/m   RA Area:     19.90 cm LA Vol (A2C):   43.7 ml 23.11 ml/m  RA Volume:   63.10 ml  33.37 ml/m LA Vol (A4C):   41.4 ml 21.90 ml/m LA Biplane Vol: 46.1 ml 24.38 ml/m  AORTIC VALVE LVOT Vmax:   77.40 cm/s LVOT Vmean:  51.700 cm/s LVOT VTI:    0.139 m AI PHT:      723 msec  AORTA Ao Root diam: 3.40 cm Ao Asc diam:  4.00 cm MITRAL VALVE               TRICUSPID VALVE MV Area (PHT): 5.13 cm    TR Peak grad:   20.2 mmHg MV Decel Time: 148 msec    TR Vmax:        225.00 cm/s MV E velocity: 94.30 cm/s                            SHUNTS                            Systemic VTI:  0.14 m                            Systemic Diam: 2.40 cm Mertie Moores MD Electronically signed by Mertie Moores MD Signature Date/Time: 03/23/2021/5:14:47 PM    Final      Subjective: Seen and examined at bedside and he was doing okay and denied any chest pain or shortness of breath.  Felt well and was eating his lunch.  Was able to tolerate his p.o. medications without issues today.  No other concerns or complaints this time is medically stable to be discharged and cardiology has recommended no anticoagulation at discharge and he will need to follow-up with PCP, neurology and cardiology outpatient setting.  Discharge Exam: Vitals:   03/24/21 0354 03/24/21 1124  BP: 136/81 133/74  Pulse: 61 (!) 52  Resp: 18 16  Temp: (!) 97.5 F (36.4 C) (!) 97.4  F (36.3 C)  SpO2: 100% 100%   Vitals:   03/23/21 1458 03/23/21 1925  03/24/21 0354 03/24/21 1124  BP: (!) 154/77 (!) 150/87 136/81 133/74  Pulse: (!) 46 67 61 (!) 52  Resp: 18 15 18 16   Temp: 98.1 F (36.7 C) 98.2 F (36.8 C) (!) 97.5 F (36.4 C) (!) 97.4 F (36.3 C)  TempSrc: Oral Oral Axillary Axillary  SpO2: 100% 100% 100% 100%  Weight:      Height:       General: Pt is alert, awake, not in acute distress Cardiovascular: Irregularly irregular and slightly bradycardic, S1/S2 +, no rubs, no gallops Respiratory: Diminished bilaterally with coarse breath sounds, no wheezing, no rhonchi Abdominal: Soft, NT, ND, bowel sounds +; unlabored breathing Extremities: no edema, no cyanosis  The results of significant diagnostics from this hospitalization (including imaging, microbiology, ancillary and laboratory) are listed below for reference.    Microbiology: Recent Results (from the past 240 hour(s))  SARS CORONAVIRUS 2 (TAT 6-24 HRS) Nasopharyngeal Nasopharyngeal Swab     Status: None   Collection Time: 03/18/21  6:16 AM   Specimen: Nasopharyngeal Swab  Result Value Ref Range Status   SARS Coronavirus 2 NEGATIVE NEGATIVE Final    Comment: (NOTE) SARS-CoV-2 target nucleic acids are NOT DETECTED.  The SARS-CoV-2 RNA is generally detectable in upper and lower respiratory specimens during the acute phase of infection. Negative results do not preclude SARS-CoV-2 infection, do not rule out co-infections with other pathogens, and should not be used as the sole basis for treatment or other patient management decisions. Negative results must be combined with clinical observations, patient history, and epidemiological information. The expected result is Negative.  Fact Sheet for Patients: SugarRoll.be  Fact Sheet for Healthcare Providers: https://www.woods-mathews.com/  This test is not yet approved or cleared by the [place] FDA and  has been authorized for detection and/or diagnosis of SARS-CoV-2 by FDA  under an Emergency Use Authorization (EUA). This EUA will remain  in effect (meaning this test can be used) for the duration of the COVID-19 declaration under Se ction 564(b)(1) of the Act, 21 U.S.C. section 360bbb-3(b)(1), unless the authorization is terminated or revoked sooner.  Performed at [facility], [place] [address], [place], [place] [address]   Resp Panel by RT-PCR (Flu A&B, Covid) Nasopharyngeal Swab     Status: None   Collection Time: 03/23/21  3:10 PM   Specimen: Nasopharyngeal Swab; Nasopharyngeal(NP) swabs in vial transport medium  Result Value Ref Range Status   SARS Coronavirus 2 by RT PCR NEGATIVE NEGATIVE Final    Comment: (NOTE) SARS-CoV-2 target nucleic acids are NOT DETECTED.  The SARS-CoV-2 RNA is generally detectable in upper respiratory specimens during the acute phase of infection. The lowest concentration of SARS-CoV-2 viral copies this assay can detect is 138 copies/mL. A negative result does not preclude SARS-Cov-2 infection and should not be used as the sole basis for treatment or other patient management decisions. A negative result may occur with  improper specimen collection/handling, submission of specimen other than nasopharyngeal swab, presence of viral mutation(s) within the areas targeted by this assay, and inadequate number of viral copies(<138 copies/mL). A negative result must be combined with clinical observations, patient history, and epidemiological information. The expected result is Negative.  Fact Sheet for Patients:  EntrepreneurPulse.com.au  Fact Sheet for Healthcare Providers:  IncredibleEmployment.be  This test is no t yet approved or cleared by the [place] FDA and  has been authorized for detection  and/or diagnosis of SARS-CoV-2 by FDA under an Emergency Use Authorization (EUA). This EUA will remain  in effect (meaning this test can be used) for the duration of the COVID-19  declaration under Section 564(b)(1) of the Act, 21 U.S.C.section 360bbb-3(b)(1), unless the authorization is terminated  or revoked sooner.       Influenza A by PCR NEGATIVE NEGATIVE Final   Influenza B by PCR NEGATIVE NEGATIVE Final    Comment: (NOTE) The Xpert Xpress SARS-CoV-2/FLU/RSV plus assay is intended as an aid in the diagnosis of influenza from Nasopharyngeal swab specimens and should not be used as a sole basis for treatment. Nasal washings and aspirates are unacceptable for Xpert Xpress SARS-CoV-2/FLU/RSV testing.  Fact Sheet for Patients: EntrepreneurPulse.com.au  Fact Sheet for Healthcare Providers: IncredibleEmployment.be  This test is not yet approved or cleared by the [place] FDA and has been authorized for detection and/or diagnosis of SARS-CoV-2 by FDA under an Emergency Use Authorization (EUA). This EUA will remain in effect (meaning this test can be used) for the duration of the COVID-19 declaration under Section 564(b)(1) of the Act, 21 U.S.C. section 360bbb-3(b)(1), unless the authorization is terminated or revoked.  Performed at [facility], [place] [address], [place], [place] [address]     Labs: BNP (last 3 results) No results for input(s): BNP in the last 8760 hours. Basic Metabolic Panel: Recent Labs  Lab 03/18/21 1242 03/19/21 0355 03/22/21 0857 03/23/21 0356 03/24/21 0351  NA 136 135 141 135 133*  K 3.9 4.4 4.3 4.0 4.7  CL 98 96* 104 100 100  CO2 28 27 29 29 26   GLUCOSE 114* 110* 104* 118* 97  BUN 13 15 19 23 23   CREATININE 0.63 0.72 0.52* 0.93 0.71  CALCIUM 8.8* 9.5 8.9 8.9 9.0  MG  --   --  1.9 1.9 2.0  PHOS  --   --  3.4 3.3 3.6   Liver Function Tests: Recent Labs  Lab 03/18/21 1242 03/19/21 0355 03/22/21 0857 03/23/21 0356 03/24/21 0351  AST 16 16 13* 13* 16  ALT 11 8 7  <5 <5  ALKPHOS 81 82 70 78 82  BILITOT 1.1 0.9 0.7 0.7 0.8  PROT 7.4 7.7 6.9 6.9 7.0   ALBUMIN 3.7 4.0 3.5 3.5 3.6   No results for input(s): LIPASE, AMYLASE in the last 168 hours. No results for input(s): AMMONIA in the last 168 hours. CBC: Recent Labs  Lab 03/17/21 1755 03/18/21 1242 03/19/21 0355 03/22/21 0857 03/23/21 0356 03/24/21 0351  WBC 9.9 7.6 9.7 7.9 7.3 9.6  NEUTROABS 6.7 4.9  --  4.6 4.2 4.9  HGB 15.0 14.6 15.3 14.7 14.7 15.4  HCT 45.7 44.4 46.5 44.1 44.9 46.2  MCV 90.0 89.2 88.1 88.4 89.4 88.3  PLT 262 277 270 232 237 242   Cardiac Enzymes: No results for input(s): CKTOTAL, CKMB, CKMBINDEX, TROPONINI in the last 168 hours. BNP: Invalid input(s): POCBNP CBG: Recent Labs  Lab 03/23/21 1154 03/23/21 1727 03/23/21 2158 03/24/21 0721 03/24/21 1119  GLUCAP 85 88 119* 93 89   D-Dimer No results for input(s): DDIMER in the last 72 hours. Hgb A1c No results for input(s): HGBA1C in the last 72 hours. Lipid Profile No results for input(s): CHOL, HDL, LDLCALC, TRIG, CHOLHDL, LDLDIRECT in the last 72 hours. Thyroid function studies Recent Labs    03/22/21 1512  TSH 5.180*   Anemia work up No results for input(s): VITAMINB12, FOLATE, FERRITIN, TIBC, IRON, RETICCTPCT in the last 72 hours. Urinalysis  Component Value Date/Time   COLORURINE YELLOW 03/17/2021 1910   APPEARANCEUR CLEAR 03/17/2021 1910   LABSPEC 1.016 03/17/2021 1910   PHURINE 6.0 03/17/2021 1910   GLUCOSEU NEGATIVE 03/17/2021 1910   HGBUR NEGATIVE 03/17/2021 1910   HGBUR trace-intact 02/22/2010 0850   BILIRUBINUR NEGATIVE 03/17/2021 1910   BILIRUBINUR 1+ 03/11/2018 1808   KETONESUR 5 (A) 03/17/2021 1910   PROTEINUR NEGATIVE 03/17/2021 1910   UROBILINOGEN 4.0 (A) 03/11/2018 1808   UROBILINOGEN 1.0 10/02/2014 2242   NITRITE NEGATIVE 03/17/2021 1910   LEUKOCYTESUR NEGATIVE 03/17/2021 1910   Sepsis Labs Invalid input(s): PROCALCITONIN,  WBC,  LACTICIDVEN Microbiology Recent Results (from the past 240 hour(s))  SARS CORONAVIRUS 2 (TAT 6-24 HRS) Nasopharyngeal  Nasopharyngeal Swab     Status: None   Collection Time: 03/18/21  6:16 AM   Specimen: Nasopharyngeal Swab  Result Value Ref Range Status   SARS Coronavirus 2 NEGATIVE NEGATIVE Final    Comment: (NOTE) SARS-CoV-2 target nucleic acids are NOT DETECTED.  The SARS-CoV-2 RNA is generally detectable in upper and lower respiratory specimens during the acute phase of infection. Negative results do not preclude SARS-CoV-2 infection, do not rule out co-infections with other pathogens, and should not be used as the sole basis for treatment or other patient management decisions. Negative results must be combined with clinical observations, patient history, and epidemiological information. The expected result is Negative.  Fact Sheet for Patients: SugarRoll.be  Fact Sheet for Healthcare Providers: https://www.woods-mathews.com/  This test is not yet approved or cleared by the [place] FDA and  has been authorized for detection and/or diagnosis of SARS-CoV-2 by FDA under an Emergency Use Authorization (EUA). This EUA will remain  in effect (meaning this test can be used) for the duration of the COVID-19 declaration under Se ction 564(b)(1) of the Act, 21 U.S.C. section 360bbb-3(b)(1), unless the authorization is terminated or revoked sooner.  Performed at [facility], [place] [address], [place], [place] [address]   Resp Panel by RT-PCR (Flu A&B, Covid) Nasopharyngeal Swab     Status: None   Collection Time: 03/23/21  3:10 PM   Specimen: Nasopharyngeal Swab; Nasopharyngeal(NP) swabs in vial transport medium  Result Value Ref Range Status   SARS Coronavirus 2 by RT PCR NEGATIVE NEGATIVE Final    Comment: (NOTE) SARS-CoV-2 target nucleic acids are NOT DETECTED.  The SARS-CoV-2 RNA is generally detectable in upper respiratory specimens during the acute phase of infection. The lowest concentration of SARS-CoV-2 viral copies this assay can  detect is 138 copies/mL. A negative result does not preclude SARS-Cov-2 infection and should not be used as the sole basis for treatment or other patient management decisions. A negative result may occur with  improper specimen collection/handling, submission of specimen other than nasopharyngeal swab, presence of viral mutation(s) within the areas targeted by this assay, and inadequate number of viral copies(<138 copies/mL). A negative result must be combined with clinical observations, patient history, and epidemiological information. The expected result is Negative.  Fact Sheet for Patients:  EntrepreneurPulse.com.au  Fact Sheet for Healthcare Providers:  IncredibleEmployment.be  This test is no t yet approved or cleared by the [place] FDA and  has been authorized for detection and/or diagnosis of SARS-CoV-2 by FDA under an Emergency Use Authorization (EUA). This EUA will remain  in effect (meaning this test can be used) for the duration of the COVID-19 declaration under Section 564(b)(1) of the Act, 21 U.S.C.section 360bbb-3(b)(1), unless the authorization is terminated  or revoked sooner.  Influenza A by PCR NEGATIVE NEGATIVE Final   Influenza B by PCR NEGATIVE NEGATIVE Final    Comment: (NOTE) The Xpert Xpress SARS-CoV-2/FLU/RSV plus assay is intended as an aid in the diagnosis of influenza from Nasopharyngeal swab specimens and should not be used as a sole basis for treatment. Nasal washings and aspirates are unacceptable for Xpert Xpress SARS-CoV-2/FLU/RSV testing.  Fact Sheet for Patients: EntrepreneurPulse.com.au  Fact Sheet for Healthcare Providers: IncredibleEmployment.be  This test is not yet approved or cleared by the [place] FDA and has been authorized for detection and/or diagnosis of SARS-CoV-2 by FDA under an Emergency Use Authorization (EUA). This EUA will remain in  effect (meaning this test can be used) for the duration of the COVID-19 declaration under Section 564(b)(1) of the Act, 21 U.S.C. section 360bbb-3(b)(1), unless the authorization is terminated or revoked.  Performed at [facility], [place] [address], [place], [place] [address]    Time coordinating discharge: 35 minutes  SIGNED:  Kerney Elbe, DO [facility] 03/24/2021, 1:24 PM Pager is on AMION  If 7PM-7AM, please contact night-coverage [email or web address]

## 2021-03-24 NOTE — Progress Notes (Signed)
[facility] came to pick up patient to take to [facility]. Patient was stable upon discharge when patient was picked up by [facility]. Discharge paperwork was completed during dayshift and report was given to the receiving facility by dayshift nurse. Nurse called patient's son to let him know that patient had been picked by [facility] to be taken to [facility], but patient's son did not answer. Nurse left a message to have patient's son to call the nurse back and that it was nothing urgent. Nurse awaiting patient's son's to call back.

## 2021-03-24 NOTE — Progress Notes (Signed)
Progress Note  Patient Name: Gerald Leach Date of Encounter: 03/24/2021  [facility] Cardiologist: None   Subjective   Pt more awake today  Eating lunch    Denies CP  No SOB   Inpatient Medications    Scheduled Meds:  amantadine  100 mg Oral Daily   carbidopa-levodopa  1 tablet Oral TID   insulin aspart  0-6 Units Subcutaneous TID WC   lacosamide  150 mg Oral BID   levETIRAcetam  1,500 mg Oral BID   levothyroxine  75 mcg Oral Q0600   pantoprazole  40 mg Oral Daily   QUEtiapine  12.5 mg Oral QHS   vitamin B-12  1,000 mcg Oral Daily   Continuous Infusions:   PRN Meds: acetaminophen **OR** acetaminophen, ondansetron **OR** ondansetron (ZOFRAN) IV   Vital Signs    Vitals:   03/23/21 1458 03/23/21 1925 03/24/21 0354 03/24/21 1124  BP: (!) 154/77 (!) 150/87 136/81 133/74  Pulse: (!) 46 67 61 (!) 52  Resp: 18 15 18 16   Temp: 98.1 F (36.7 C) 98.2 F (36.8 C) (!) 97.5 F (36.4 C) (!) 97.4 F (36.3 C)  TempSrc: Oral Oral Axillary Axillary  SpO2: 100% 100% 100% 100%  Weight:      Height:        Intake/Output Summary (Last 24 hours) at 03/24/2021 1247 Last data filed at 03/24/2021 0946 Gross per 24 hour  Intake 457.13 ml  Output 1075 ml  Net -617.87 ml   Last 3 Weights 03/18/2021 03/17/2021 12/09/2020  Weight (lbs) 151 lb 160 lb 165 lb  Weight (kg) 68.493 kg 72.576 kg 74.844 kg  Some encounter information is confidential and restricted. Go to Review Flowsheets activity to see all data.      Telemetry    Afib --  40s / 50s    One NSVT 5 beat  - Personally Reviewed  ECG    No new   - Personally Reviewed  Physical Exam   GEN: Thin, elderly 87 yo acute distress.   Neck: No JVD Cardiac: Irreg irreg  No S3  Nomurmurs   Respiratory: Clear to auscultation bilaterally. GI: Soft, nontender, non-distended  MS: No edema; No deformity.    Labs    High Sensitivity Troponin:   Recent Labs  Lab 03/22/21 1121 03/22/21 1319  TROPONINIHS 4 4      Chemistry Recent Labs  Lab 03/22/21 0857 03/23/21 0356 03/24/21 0351  NA 141 135 133*  K 4.3 4.0 4.7  CL 104 100 100  CO2 29 29 26   GLUCOSE 104* 118* 97  BUN 19 23 23   CREATININE 0.52* 0.93 0.71  CALCIUM 8.9 8.9 9.0  MG 1.9 1.9 2.0  PROT 6.9 6.9 7.0  ALBUMIN 3.5 3.5 3.6  AST 13* 13* 16  ALT 7 <5 <5  ALKPHOS 70 78 82  BILITOT 0.7 0.7 0.8  GFRNONAA >60 >60 >60  ANIONGAP 8 6 7     Lipids No results for input(s): CHOL, TRIG, HDL, LABVLDL, LDLCALC, CHOLHDL in the last 168 hours.  Hematology Recent Labs  Lab 03/22/21 0857 03/23/21 0356 03/24/21 0351  WBC 7.9 7.3 9.6  RBC 4.99 5.02 5.23  HGB 14.7 14.7 15.4  HCT 44.1 44.9 46.2  MCV 88.4 89.4 88.3  MCH 29.5 29.3 29.4  MCHC 33.3 32.7 33.3  RDW 12.9 12.7 12.8  PLT 232 237 242   Thyroid  Recent Labs  Lab 03/22/21 1512 03/22/21 1916  TSH 5.180*  --   FREET4  --  0.95    BNPNo results for input(s): BNP, PROBNP in the last 168 hours.  DDimer No results for input(s): DDIMER in the last 168 hours.   Radiology    ECHOCARDIOGRAM COMPLETE  Result Date: 03/23/2021    ECHOCARDIOGRAM REPORT   Patient Name:   RONI SCOW Date of Exam: 03/23/2021 Medical Rec #:  [id number]      Height:       72.0 in Accession #:    [id number]     Weight:       151.0 lb Date of Birth:  1933-10-12       BSA:          1.891 m Patient Age:    84 years       BP:           157/81 mmHg Patient Gender: M              HR:           55 bpm. Exam Location:  Inpatient Procedure: 2D Echo, Cardiac Doppler and Color Doppler Indications:    R94.31 Abnormal EKG; I48.91* Unspeicified atrial fibrillation  History:        Patient has no prior history of Echocardiogram examinations.                 Abnormal ECG, Stroke, Signs/Symptoms:Syncope, Altered Mental                 Status and Alzheimer's; Risk Factors:Hypertension and                 Dyslipidemia. Seizure.  Sonographer:    Roseanna Rainbow RDCS Referring Phys: [id number] [facility] LATIF [facility]  Sonographer Comments:  Technically difficult study due to poor echo windows and no subcostal window. IMPRESSIONS  1. Left ventricular ejection fraction, by estimation, is 65 to 70%. The left ventricle has normal function. The left ventricle has no regional wall motion abnormalities. Left ventricular diastolic function could not be evaluated.  2. Right ventricular systolic function is moderately reduced. The right ventricular size is mildly enlarged.  3. Right atrial size was mildly dilated.  4. The mitral valve is grossly normal. Trivial mitral valve regurgitation.  5. The aortic valve is grossly normal. Aortic valve regurgitation is mild.  6. Aortic dilatation noted. There is mild dilatation of the ascending aorta, measuring 40 mm. FINDINGS  Left Ventricle: Left ventricular ejection fraction, by estimation, is 65 to 70%. The left ventricle has normal function. The left ventricle has no regional wall motion abnormalities. The left ventricular internal cavity size was normal in size. There is  no left ventricular hypertrophy. Left ventricular diastolic function could not be evaluated due to atrial fibrillation. Left ventricular diastolic function could not be evaluated. Right Ventricle: The right ventricular size is mildly enlarged. Right vetricular wall thickness was not well visualized. Right ventricular systolic function is moderately reduced. Left Atrium: Left atrial size was normal in size. Right Atrium: Right atrial size was mildly dilated. Pericardium: There is no evidence of pericardial effusion. Mitral Valve: The mitral valve is grossly normal. Trivial mitral valve regurgitation. Tricuspid Valve: The tricuspid valve is grossly normal. Tricuspid valve regurgitation is trivial. Aortic Valve: The aortic valve is grossly normal. Aortic valve regurgitation is mild. Aortic regurgitation PHT measures 723 msec. Pulmonic Valve: The pulmonic valve was grossly normal. Pulmonic valve regurgitation is not visualized. Aorta: Aortic dilatation  noted. There is mild dilatation of the ascending aorta, measuring 40 mm. IAS/Shunts: The atrial septum is  grossly normal.  LEFT VENTRICLE PLAX 2D LVIDd:         4.10 cm LVIDs:         2.70 cm LV PW:         1.10 cm LV IVS:        1.00 cm LVOT diam:     2.40 cm LV SV:         63 LV SV Index:   33 LVOT Area:     4.52 cm  LV Volumes (MOD) LV vol d, MOD A2C: 84.1 ml LV vol d, MOD A4C: 93.2 ml LV vol s, MOD A2C: 31.3 ml LV vol s, MOD A4C: 33.9 ml LV SV MOD A2C:     52.8 ml LV SV MOD A4C:     93.2 ml LV SV MOD BP:      54.4 ml RIGHT VENTRICLE RV S prime:     12.40 cm/s TAPSE (M-mode): 1.4 cm LEFT ATRIUM             Index        RIGHT ATRIUM           Index LA diam:        4.00 cm 2.12 cm/m   RA Area:     19.90 cm LA Vol (A2C):   43.7 ml 23.11 ml/m  RA Volume:   63.10 ml  33.37 ml/m LA Vol (A4C):   41.4 ml 21.90 ml/m LA Biplane Vol: 46.1 ml 24.38 ml/m  AORTIC VALVE LVOT Vmax:   77.40 cm/s LVOT Vmean:  51.700 cm/s LVOT VTI:    0.139 m AI PHT:      723 msec  AORTA Ao Root diam: 3.40 cm Ao Asc diam:  4.00 cm MITRAL VALVE               TRICUSPID VALVE MV Area (PHT): 5.13 cm    TR Peak grad:   20.2 mmHg MV Decel Time: 148 msec    TR Vmax:        225.00 cm/s MV E velocity: 94.30 cm/s                            SHUNTS                            Systemic VTI:  0.14 m                            Systemic Diam: 2.40 cm Mertie Moores MD Electronically signed by Mertie Moores MD Signature Date/Time: 03/23/2021/5:14:47 PM    Final     Cardiac Studies   Echo-   Left ventricular ejection fraction, by estimation, is 65 to 70%. The left ventricle has normal function. The left ventricle has no regional wall motion abnormalities. Left ventricular diastolic function could not be evaluated. 1. Right ventricular systolic function is moderately reduced. The right ventricular size is mildly enlarged. 2. 3. Right atrial size was mildly dilated. 4. The mitral valve is grossly normal. Trivial mitral valve regurgitation. 5. The  aortic valve is grossly normal. Aortic valve regurgitation is mild. Aortic dilatation noted. There is mild dilatation of the ascending aorta, measuring 40 mm.  Patient Profile     SABIEN UMLAND is a 87 y.o. male with a hx of DM II, chronic atrial fibrillation, hypothyroidism and seizures who is being seen  03/22/2021 for the evaluation of atrial fibrillation at the request of Dr. Alfredia Ferguson.  Assessment & Plan    1  Chronic atrial fibrillation   Patient wht permanent atrial fib  Not new   Not on rate control  No anticoagulation given  fall risk Echo is noted above   I have reviewed images   I think RVEF is better than reported  HR low but no pauses   1 NSVT      I would not recomm any Rx    No anticoagulation give fall risk      Will sign off    For questions or updates, please contact [facility] Please consult [email or web address] for contact info under        Signed, Dorris Carnes, MD  03/24/2021, 12:47 PM

## 2021-03-24 NOTE — Plan of Care (Signed)

## 2021-03-24 NOTE — Progress Notes (Signed)
Report given to RN Marzetta Board at [facility]. All questions answered. Pt d/c from tele and IV. Son notified by Kerby Moors. Transportation called by [employer]. Oralia Rud, RN

## 2021-03-27 DIAGNOSIS — R5381 Other malaise: Secondary | ICD-10-CM | POA: Diagnosis not present

## 2021-03-27 DIAGNOSIS — F02818 Dementia in other diseases classified elsewhere, unspecified severity, with other behavioral disturbance: Secondary | ICD-10-CM | POA: Diagnosis not present

## 2021-03-27 DIAGNOSIS — G20 Parkinson's disease: Secondary | ICD-10-CM | POA: Diagnosis not present

## 2021-04-06 DIAGNOSIS — G20 Parkinson's disease: Secondary | ICD-10-CM | POA: Diagnosis not present

## 2021-04-06 DIAGNOSIS — R5381 Other malaise: Secondary | ICD-10-CM | POA: Diagnosis not present

## 2021-04-06 DIAGNOSIS — F02818 Dementia in other diseases classified elsewhere, unspecified severity, with other behavioral disturbance: Secondary | ICD-10-CM | POA: Diagnosis not present

## 2021-04-07 ENCOUNTER — Other Ambulatory Visit: Payer: Self-pay | Admitting: Adult Health

## 2021-04-07 ENCOUNTER — Ambulatory Visit (INDEPENDENT_AMBULATORY_CARE_PROVIDER_SITE_OTHER): Payer: Medicare Other

## 2021-04-07 DIAGNOSIS — F0280 Dementia in other diseases classified elsewhere without behavioral disturbance: Secondary | ICD-10-CM

## 2021-04-07 DIAGNOSIS — R296 Repeated falls: Secondary | ICD-10-CM

## 2021-04-07 DIAGNOSIS — G40909 Epilepsy, unspecified, not intractable, without status epilepticus: Secondary | ICD-10-CM

## 2021-04-07 DIAGNOSIS — E119 Type 2 diabetes mellitus without complications: Secondary | ICD-10-CM

## 2021-04-07 DIAGNOSIS — G20 Parkinson's disease: Secondary | ICD-10-CM

## 2021-04-07 NOTE — Patient Instructions (Signed)
Visit Information  PATIENT GOALS:  Goals Addressed             This Visit's Progress    RNCM:Eat Healthy   On track    Timeframe:  Long-Range Goal Priority:  Medium Start Date:     01/27/21                        Expected End Date:     06/18/21                  Follow Up Date  05/16/21    - change to whole grain breads, cereal, pasta - drink 6 to 8 glasses of water each day - fill half of plate with vegetables - read food labels for fat, fiber, carbohydrates and portion size - switch to sugar-free drinks -drink diabetic nutritional supplement daily as needed    Why is this important?   When you are ready to manage your nutrition or weight, having a plan and setting goals will help.  Taking small steps to change how you eat and exercise is a good place to start.    Notes:      RNCM:Prevent Falls-Dementia   On track    Timeframe:  Long-Range Goal Priority:  High Start Date:         01/27/21                    Expected End Date:   06/18/21                   Follow Up Date 05/16/21    - always use handrails on the stairs - install bathroom grab bars - keep a flashlight by the bed - keep cell phone with me always - make an emergency alert plan in case I fall - pick up clutter from the floors - remove throw rugs or use nonslip pads - use a cane or walker - use nightlight in the bathroom, halls - always wear low-heeled or flat shoes or slippers with nonskid soles    Why is this important?   There may be trouble with balance and getting around. Falls can happen.    Notes:         The patient verbalized understanding of instructions, educational materials, and care plan provided today and declined offer to receive copy of patient instructions, educational materials, and care plan.   Telephone follow up appointment with care management team member scheduled for: 05/16/21 at Young Harris RN, [facility], CDE Care Management Coordinator [facility] [phone], Mobile [phone]

## 2021-04-07 NOTE — Chronic Care Management (AMB) (Signed)
Chronic Care Management   CCM RN Visit Note  04/07/2021 Name: Gerald Leach MRN: [id number] DOB: [birth year]  Subjective: Gerald Leach is a 87 y.o. year old male who is a primary care patient of [name], NP. The care management team was consulted for assistance with disease management and care coordination needs.    Engaged with patient by telephone for follow up visit in response to provider referral for case management and/or care coordination services.   Consent to Services:  The patient was given information about Chronic Care Management services, agreed to services, and gave verbal consent prior to initiation of services.  Please see initial visit note for detailed documentation.   Patient agreed to services and verbal consent obtained.   Assessment: Review of patient past medical history, allergies, medications, health status, including review of consultants reports, laboratory and other test data, was performed as part of comprehensive evaluation and provision of chronic care management services.   SDOH (Social Determinants of Health) assessments and interventions performed:    CCM Care Plan  No Known Allergies  Outpatient Encounter Medications as of 04/07/2021  Medication Sig Note   acetaminophen (TYLENOL) 325 MG tablet Take 2 tablets (650 mg total) by mouth every 6 (six) hours as needed for mild pain (or Fever >/= 101).    amantadine (SYMMETREL) 100 MG capsule TAKE 1 CAPSULE BY MOUTH EVERY DAY (Patient taking differently: Take 100 mg by mouth daily.) 03/18/2021: LF 02/27/21 #90/90   carbidopa-levodopa (SINEMET IR) 25-100 MG tablet TAKE 1 TABLET BY MOUTH THREE TIMES A DAY 03/18/2021: LF 12/25/20 #270/90   lacosamide 150 MG TABS Take 1 tablet (150 mg total) by mouth 2 (two) times daily.    levETIRAcetam (KEPPRA) 750 MG tablet TAKE 2 TABLETS (1,500 MG TOTAL) BY MOUTH 2 (TWO) TIMES DAILY. 03/18/2021: LF 09/17/20 #120/30   levothyroxine (SYNTHROID) 75 MCG tablet TAKE 1 TABLET BY  MOUTH EVERY DAY 03/18/2021: LF 01/13/21 #90/90   metFORMIN (GLUCOPHAGE) 500 MG tablet TAKE 1 TABLET (500 MG TOTAL) BY MOUTH 2 (TWO) TIMES DAILY WITH A MEAL. NEED APPOINTMENT (Patient taking differently: Take 500 mg by mouth 2 (two) times daily with a meal.) 03/18/2021: LF 02/21/21 #180/90   omeprazole (PRILOSEC) 20 MG capsule TAKE 1 CAPSULE BY MOUTH EVERY DAY 03/18/2021: LF 02/07/21 #90/90   ondansetron (ZOFRAN) 4 MG tablet Take 1 tablet (4 mg total) by mouth every 6 (six) hours as needed for nausea.    QUEtiapine (SEROQUEL) 25 MG tablet Take 0.5 tablets (12.5 mg total) by mouth at bedtime.    vitamin B-12 (CYANOCOBALAMIN) 1000 MCG tablet Take 1,000 mcg by mouth daily.    No facility-administered encounter medications on file as of 04/07/2021.    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 ([place]) 08/16/2020   Fall 08/11/2020   Closed compression fracture of L5 lumbar vertebra, initial encounter ([place]) 08/11/2020   Seizures ([place]) 03/05/2018   Leukocytosis 03/05/2018   Diabetic foot ulcers ([place]) 04/23/2017   Parkinson disease ([place])    Gait instability    Diabetes mellitus type 2, controlled ([place]) 08/05/2015   Hypothyroidism 08/05/2015   Fall at home 10/02/2014   CNS aneurysm s/p coiling ([facility], 2002) 10/02/2014   High grade T7 central canal stenosis with T7 vertebral fracture s/p fall (02/2012) 10/02/2014   Right clinoid calcified meningioma vs thrombosed giant ICA aneurysm, conservative management. 10/02/2014  Syncope 02/05/2014   Drug-induced delirium(292.81) 05/16/2013   Seizure disorder ([place]) 04/21/2013   Seizure disorder, grand mal ([place]) 03/24/2013   Cervical compression fracture---C7 10/29/2012   Dysphagia 09/21/2012   Subdural hematoma 09/20/2012   SOLAR KERATOSIS 02/17/2009   CARCINOMA, SKIN, SQUAMOUS CELL, FACE 04/19/2008   Hyperlipidemia,  group D 01/27/2008   COLONIC POLYPS 11/15/2006   GERD 11/15/2006    Conditions to be addressed/monitored:DMII, Dementia, and Parkinson's disease, seizures  Care Plan : RNCM:Neurological disease( Parkinson's disease, seizures, dementia)  Updates made by Dimitri Ped, RN since 04/07/2021 12:00 AM     Problem: Fall Risk related to Neurological disease( Parkinson's disease, seizures, dementia)   Priority: High     Long-Range Goal: Reduction of Fall and Fall-Related Injury due to Neurological disease( Parkinson's disease, seizures, dementia)   Start Date: 01/27/2021  Expected End Date: 06/18/2021  This Visit's Progress: On track  Recent Progress: On track  Priority: High  Note:   Current Barriers:  Knowledge Deficits related to fall precautions in patient with Neurological disease( Parkinson's disease, seizures, dementia) Decreased adherence to prescribed treatment for fall prevention Unable to independently self manage Neurological disease( Parkinson's disease, seizures, dementia) Unable to perform ADLs independently Unable to perform IADLs independently Knowledge Deficits related to self management of Neurological disease( Parkinson's disease, seizures, dementia) and fall prevention Chronic Disease Management support and education needs related to self management of Neurological disease( Parkinson's disease, seizures, dementia) and fall prevention Cognitive Deficits Spoke with son Briton Sellman.     States he does using a walker.  States he has a paid caregiver from 1-5 who helps with cooking, cleaning and reminds pt to take his medications.   Son and daughter share duties caring for pt.  Pt was admitted to hospital 03/17/21-03/24/21 for seizures and is currently at [facility] for rehab.  Son states that pt is going home on Saturday.  States that he is to get home health therapy.  States pt is back to what he was before going to the hospital.  States he has not had any seizures or falls  while at rehab.  States pt his eating good and his mood is good. States pt will be alone some but he will still have his paid care giver from 1-5:30 and he and his sister will be checking on him frequently Clinical Goal(s):  patient will demonstrate improved adherence to prescribed treatment plan for decreasing falls as evidenced by patient reporting and review of EMR patient will verbalize using fall risk reduction strategies discussed patient will not experience additional falls patient will verbalize understanding of plan for self management of Neurological disease( Parkinson's disease, seizures, dementia) and fall prevention patient will meet with RN Care Manager to address self management of Neurological disease( Parkinson's disease, seizures, dementia) and fall prevention patient will take all medications exactly as prescribed and will call provider for medication related questions patient will demonstrate improved adherence to prescribed treatment plan for self management of Neurological disease( Parkinson's disease, seizures, dementia) and fall prevention patient will verbalize basic understanding of self management of Neurological disease( Parkinson's disease, seizures, dementia) and fall prevention disease process and self health management plan patient will work with CM team pharmacist to address cost of medications and adherence issues patient will work with CM clinical social worker to provide caregiver support and resources-completed referral with resources given Interventions:  Collaboration with [name], NP regarding development and update of comprehensive plan of care as evidenced by provider attestation and  co-signature Inter-disciplinary care team collaboration (see longitudinal plan of care) Provided written and verbal education re: Potential causes of falls and Fall prevention strategies Reviewed medications and discussed potential side effects of medications such as  dizziness and frequent urination Assessed for s/s of orthostatic hypotension Assessed for falls since last encounter Assessed patients knowledge of fall risk prevention secondary to previously provided education. Evaluation of current treatment plan related to self management of Neurological disease( Parkinson's disease, seizures, dementia) and fall prevention and patient's adherence to plan as established by provider. Reinforced education to patient re: self management of Neurological disease( Parkinson's disease, seizures, dementia) and fall prevention Reviewed medications with patient and discussed continued adherence Social Work referral for provide caregiver support and resources-LCSW completed referral with resources given-Reviewed that LCSW can assist again for caregiver stress if needed Pharmacy referral for to address cost of medications and adherence issues-completed visit on 02/07/21 Discussed plans with patient for ongoing care management follow up and provided patient with direct contact information for care management team Reviewed to pick appointment with Beaulah Dinning NP on 04/11/21 and  neurology on 04/26/21 Self-Care Deficits:  Unable to self administer medications as prescribed Unable to perform ADLs independently Unable to perform IADLs independently Patient Goals:  - Utilize walker (assistive device) appropriately with all ambulation - De-clutter walkways - Change positions slowly - Wear secure fitting shoes at all times with ambulation - Utilize home lighting for dim lit areas - Demonstrate self and pet awareness at all times - always use handrails on the stairs - install bathroom grab bars - keep a flashlight by the bed - keep cell phone with me always - make an emergency alert plan in case I fall - pick up clutter from the floors - remove throw rugs or use nonslip pads - use a cane or walker - use nightlight in the bathroom, halls - always wear low-heeled or flat  shoes or slippers with nonskid soles Follow Up Plan: Telephone follow up appointment with care management team member scheduled for: 05/16/21 at 9 AM The patient has been provided with contact information for the care management team and has been advised to call with any health related questions or concerns.      Care Plan : RNCM:Diabetes Type 2 (Adult)  Updates made by Dimitri Ped, RN since 04/07/2021 12:00 AM     Problem: Lack of long term self managment of  Type 2 Diabetes   Priority: Medium     Long-Range Goal: Effective long term self managment of  Type 2 Diabetes   Start Date: 01/27/2021  Expected End Date: 06/18/2021  This Visit's Progress: On track  Recent Progress: On track  Priority: Medium  Note:   Objective:  Lab Results  Component Value Date   HGBA1C 5.6 08/11/2020   Lab Results  Component Value Date   CREATININE 0.85 08/23/2020   CREATININE 0.74 08/12/2020   CREATININE 0.87 08/11/2020   No results found for: EGFR Current Barriers:  Knowledge Deficits related to basic Diabetes pathophysiology and self care/management Knowledge Deficits related to medications used for management of diabetes Cognitive Deficits Does not use cbg meter  Unable to independently self manage Type 2 Diabetes Unable to perform ADLs independently Unable to perform IADLs independently Spoke with son [place] Ar [employer]. States he checked his CBG about 2 weeks ago and it was 110.  States he like to eat cereal for breakfast and his favorite type is cinnamon toast crunch.  States pt will  eat breakfast, not much for lunch and a good supper of a meat and 2 vegetables.  States he does drink Ensure sometimes.  04/07/21 Son states that rehab has been checking pts CBG 3 times a day and his readings have been very good.  States appetite is good. Case Manager Clinical Goal(s):  patient will demonstrate improved adherence to prescribed treatment plan for diabetes self  care/management as evidenced by: adherence to ADA/ carb modified diet adherence to prescribed medication regimen contacting provider for new or worsened symptoms or questions Interventions:  Collaboration with Carlisle Cater, Tommi Rumps, NP regarding development and update of comprehensive plan of care as evidenced by provider attestation and co-signature Inter-disciplinary care team collaboration (see longitudinal plan of care) Reinforced  education to patient about basic DM disease process Reviewed medications with patient and discussed importance of medication adherence Discussed plans with patient for ongoing care management follow up and provided patient with direct contact information for care management team Reinforced s/sx of  hypoglycemia and hyperglycemia and importance of correct treatment Referral made to pharmacy team for assistance with issues with cost of medications and adherence completed visit 02/07/21 Referral made to social work team for assistance with caregiver support, level of care issues and community resources-completed referral with resources given Review of patient status, including review of consultants reports, relevant laboratory and other test results, and medications completed. Reinforced to try diabetic nutritional supplements to support his nutritional needs Self-Care Activities - Self administers oral medications as prescribed Attends all scheduled provider appointments Adheres to prescribed ADA/carb modified Patient Goals: - change to whole grain breads, cereal, pasta - drink 6 to 8 glasses of water each day - fill half of plate with vegetables - manage portion size - read food labels for fat, fiber, carbohydrates and portion size - reduce red meat to 2 to 3 times a week - switch to sugar-free drinks - schedule appointment with eye doctor - check feet daily for cuts, sores or redness - wash and dry feet carefully every day - wear comfortable, cotton socks - wear  comfortable, well-fitting shoes -drink diabetic nutritional supplement daily as needed Follow Up Plan: Telephone follow up appointment with care management team member scheduled for: 05/16/21 at 9 AM The patient has been provided with contact information for the care management team and has been advised to call with any health related questions or concerns.       Plan:Telephone follow up appointment with care management team member scheduled for:  05/16/21 The patient has been provided with contact information for the care management team and has been advised to call with any health related questions or concerns.  Peter Garter RN, Jackquline Denmark, CDE Care Management Coordinator [facility] [phone], Mobile [phone]

## 2021-04-10 ENCOUNTER — Telehealth: Payer: Self-pay

## 2021-04-10 ENCOUNTER — Other Ambulatory Visit: Payer: Self-pay

## 2021-04-10 DIAGNOSIS — G20 Parkinson's disease: Secondary | ICD-10-CM | POA: Diagnosis not present

## 2021-04-10 DIAGNOSIS — E785 Hyperlipidemia, unspecified: Secondary | ICD-10-CM | POA: Diagnosis not present

## 2021-04-10 DIAGNOSIS — E119 Type 2 diabetes mellitus without complications: Secondary | ICD-10-CM | POA: Diagnosis not present

## 2021-04-10 DIAGNOSIS — I482 Chronic atrial fibrillation, unspecified: Secondary | ICD-10-CM | POA: Diagnosis not present

## 2021-04-10 DIAGNOSIS — K219 Gastro-esophageal reflux disease without esophagitis: Secondary | ICD-10-CM | POA: Diagnosis not present

## 2021-04-10 DIAGNOSIS — I10 Essential (primary) hypertension: Secondary | ICD-10-CM | POA: Diagnosis not present

## 2021-04-10 DIAGNOSIS — F02811 Dementia in other diseases classified elsewhere, unspecified severity, with agitation: Secondary | ICD-10-CM | POA: Diagnosis not present

## 2021-04-10 DIAGNOSIS — E039 Hypothyroidism, unspecified: Secondary | ICD-10-CM | POA: Diagnosis not present

## 2021-04-10 DIAGNOSIS — Z7984 Long term (current) use of oral hypoglycemic drugs: Secondary | ICD-10-CM | POA: Diagnosis not present

## 2021-04-10 NOTE — Telephone Encounter (Signed)
April home health nurse called requesting verbal orders for patient education frequency 1 week 3. Call back # [phone]

## 2021-04-10 NOTE — Telephone Encounter (Signed)
Okay for verbal orders? Please advise 

## 2021-04-11 ENCOUNTER — Telehealth: Payer: Self-pay | Admitting: Adult Health

## 2021-04-11 ENCOUNTER — Ambulatory Visit: Payer: Medicare Other | Admitting: Adult Health

## 2021-04-11 DIAGNOSIS — E119 Type 2 diabetes mellitus without complications: Secondary | ICD-10-CM | POA: Diagnosis not present

## 2021-04-11 DIAGNOSIS — K219 Gastro-esophageal reflux disease without esophagitis: Secondary | ICD-10-CM | POA: Diagnosis not present

## 2021-04-11 DIAGNOSIS — G20 Parkinson's disease: Secondary | ICD-10-CM | POA: Diagnosis not present

## 2021-04-11 DIAGNOSIS — I10 Essential (primary) hypertension: Secondary | ICD-10-CM | POA: Diagnosis not present

## 2021-04-11 DIAGNOSIS — F02811 Dementia in other diseases classified elsewhere, unspecified severity, with agitation: Secondary | ICD-10-CM | POA: Diagnosis not present

## 2021-04-11 DIAGNOSIS — E785 Hyperlipidemia, unspecified: Secondary | ICD-10-CM | POA: Diagnosis not present

## 2021-04-11 DIAGNOSIS — Z7984 Long term (current) use of oral hypoglycemic drugs: Secondary | ICD-10-CM | POA: Diagnosis not present

## 2021-04-11 DIAGNOSIS — E039 Hypothyroidism, unspecified: Secondary | ICD-10-CM | POA: Diagnosis not present

## 2021-04-11 DIAGNOSIS — I482 Chronic atrial fibrillation, unspecified: Secondary | ICD-10-CM | POA: Diagnosis not present

## 2021-04-11 NOTE — Telephone Encounter (Signed)
Okay for verbal orders? Please advise 

## 2021-04-11 NOTE — Telephone Encounter (Signed)
PT evaluation was completed.  Pt will be seen twice a week for 4 weeks, and then 1 time a week for 4 weeks.   CMA can call back to give verbal orders.

## 2021-04-11 NOTE — Progress Notes (Deleted)
Subjective:    [name], male    DOB: August 22, 1933, 87 y.o.   MRN: [id number]  HPI 87 year old male who  has a past medical history of Brain aneurysm, Colonic polyp (2003), Dementia ([place]), Diabetes mellitus without complication ([place]), ED (erectile dysfunction), GERD (gastroesophageal reflux disease), Hyperlipidemia, Hypertension, Hypothyroidism, Seizures ([place]), and Seizures ([place]).  He presents to the office today for TCM visit   Admit Date  03/17/2021 Discharge Date 03/24/2021 Discharge from SNF  He presented to the hospital with metabolic encephalopathy.  He had a witnessed seizure that was seen by his family and fell to the ground and hit his head.  The seizure lasted less than a couple minutes and he was brought to the emergency room.  In the ED, neurology was contacted and the plan was to increase his Vimpat and have the patient go back, follow-up with his neurologist but he was unable to be transported by the family due to weather and while in the ER he had a second seizure and then subsequently admitted to the hospital.  At home he takes Vimpat 100 mg twice daily and Keppra 1500 mg twice daily, neurology recommended increasing the Vimpat to 250 mg twice daily.  He was also found to be in A. fib that is rate controlled and cardiology was consulted who recommended checking an echocardiogram as well as TSH.  TSH was 5.180 and T4 was 0.95.  His echo showed  1. a EF of 65 to 70%. The left ventricle has normal function. The left ventricle has no regional wall motion abnormalities. Left ventricular diastolic function could not  be evaluated.   2. Right ventricular systolic function is moderately reduced. The right  ventricular size is mildly enlarged.   3. Right atrial size was mildly dilated.   4. The mitral valve is grossly normal. Trivial mitral valve  regurgitation.   5. The aortic valve is grossly normal. Aortic valve regurgitation is  mild.   6. Aortic dilatation noted.  There is mild dilatation of the ascending  aorta, measuring 40 mm.    Hospital Course   Breakthrough Seizures  -He was admitted after a second witnessed seizure, the second being in the emergency room - At home he takes Vimpat 100 mg p.o. twice daily and Keppra 100 mg twice daily -Vimpat increased to 250 mg p.o. twice daily -He was refusing to take his medications in the hospital in the setting of his delirium and dementia -ID agents were continued.  His daughter reported that at home he takes his pills and has been taking his pills and rehab while he was there in the past so he was transitioned to p.o. on discharge -Follow-up with primary neurologist in the outpatient setting  Acute metabolic encephalopathy with history of underlying Parkinson's with dementia -He had worsening encephalopathy likely hospital delirium -Continued on supportive treatments and continue with home regimen including levodopa 25-100 mg tab, 1 tab 3 times daily,amantadine 100 mg daily, and quetiapine 12.5 mg QHS -Following history of seizures he has become extremely deconditioned and is worked with physical therapy, they recommended rehab which family was in agreement with  Hyponatremia -Mild.  Sodium has gone from 1 35-1 33  Chronic atrial fibrillation with slight bradycardia -Rate controlled without medication -Troponins were checked x2 over flat at 4 -Cardiology consulted.  Hesitant to start patient on anticoagulation given that he may not be a candidate since he lives alone and has recurrent falls.  Cardiology recommended  conservative treatment  Hypothyroidism -Continue levothyroxine 75 mg  Diabetes mellitus type 2 -Takes metformin at home -A1c in the hospital on 03/18/2021 was 5.4  GERD/GI prophylaxis  - Continue Protonix 40 mg daily.   Review of Systems     Objective:   Physical Exam        Assessment & Plan:

## 2021-04-11 NOTE — Telephone Encounter (Signed)
Verbal orders given to April.  

## 2021-04-12 DIAGNOSIS — E785 Hyperlipidemia, unspecified: Secondary | ICD-10-CM | POA: Diagnosis not present

## 2021-04-12 DIAGNOSIS — E119 Type 2 diabetes mellitus without complications: Secondary | ICD-10-CM | POA: Diagnosis not present

## 2021-04-12 DIAGNOSIS — F02811 Dementia in other diseases classified elsewhere, unspecified severity, with agitation: Secondary | ICD-10-CM | POA: Diagnosis not present

## 2021-04-12 DIAGNOSIS — K219 Gastro-esophageal reflux disease without esophagitis: Secondary | ICD-10-CM | POA: Diagnosis not present

## 2021-04-12 DIAGNOSIS — I10 Essential (primary) hypertension: Secondary | ICD-10-CM | POA: Diagnosis not present

## 2021-04-12 DIAGNOSIS — G20 Parkinson's disease: Secondary | ICD-10-CM | POA: Diagnosis not present

## 2021-04-12 DIAGNOSIS — Z7984 Long term (current) use of oral hypoglycemic drugs: Secondary | ICD-10-CM | POA: Diagnosis not present

## 2021-04-12 DIAGNOSIS — I482 Chronic atrial fibrillation, unspecified: Secondary | ICD-10-CM | POA: Diagnosis not present

## 2021-04-12 DIAGNOSIS — E039 Hypothyroidism, unspecified: Secondary | ICD-10-CM | POA: Diagnosis not present

## 2021-04-13 NOTE — Telephone Encounter (Signed)
Verbal orders given to [employer]

## 2021-04-17 ENCOUNTER — Telehealth: Payer: Self-pay | Admitting: Adult Health

## 2021-04-17 DIAGNOSIS — G20 Parkinson's disease: Secondary | ICD-10-CM

## 2021-04-17 DIAGNOSIS — F0280 Dementia in other diseases classified elsewhere without behavioral disturbance: Secondary | ICD-10-CM | POA: Diagnosis not present

## 2021-04-17 DIAGNOSIS — M2041 Other hammer toe(s) (acquired), right foot: Secondary | ICD-10-CM | POA: Diagnosis not present

## 2021-04-17 DIAGNOSIS — E119 Type 2 diabetes mellitus without complications: Secondary | ICD-10-CM | POA: Diagnosis not present

## 2021-04-17 DIAGNOSIS — M2042 Other hammer toe(s) (acquired), left foot: Secondary | ICD-10-CM | POA: Diagnosis not present

## 2021-04-17 DIAGNOSIS — L97512 Non-pressure chronic ulcer of other part of right foot with fat layer exposed: Secondary | ICD-10-CM | POA: Diagnosis not present

## 2021-04-17 NOTE — Telephone Encounter (Signed)
Mitzi Hansen from [facility] call and stated pt had a Home Health miss visit on Friday .Mitzi Hansen 's # is [phone].

## 2021-04-18 DIAGNOSIS — I10 Essential (primary) hypertension: Secondary | ICD-10-CM | POA: Diagnosis not present

## 2021-04-18 DIAGNOSIS — Z7984 Long term (current) use of oral hypoglycemic drugs: Secondary | ICD-10-CM | POA: Diagnosis not present

## 2021-04-18 DIAGNOSIS — E119 Type 2 diabetes mellitus without complications: Secondary | ICD-10-CM | POA: Diagnosis not present

## 2021-04-18 DIAGNOSIS — I482 Chronic atrial fibrillation, unspecified: Secondary | ICD-10-CM | POA: Diagnosis not present

## 2021-04-18 DIAGNOSIS — F02811 Dementia in other diseases classified elsewhere, unspecified severity, with agitation: Secondary | ICD-10-CM | POA: Diagnosis not present

## 2021-04-18 DIAGNOSIS — G20 Parkinson's disease: Secondary | ICD-10-CM | POA: Diagnosis not present

## 2021-04-18 DIAGNOSIS — E785 Hyperlipidemia, unspecified: Secondary | ICD-10-CM | POA: Diagnosis not present

## 2021-04-18 DIAGNOSIS — E039 Hypothyroidism, unspecified: Secondary | ICD-10-CM | POA: Diagnosis not present

## 2021-04-18 DIAGNOSIS — K219 Gastro-esophageal reflux disease without esophagitis: Secondary | ICD-10-CM | POA: Diagnosis not present

## 2021-04-19 ENCOUNTER — Ambulatory Visit: Payer: Medicare Other | Admitting: Adult Health

## 2021-04-19 DIAGNOSIS — I10 Essential (primary) hypertension: Secondary | ICD-10-CM | POA: Diagnosis not present

## 2021-04-19 DIAGNOSIS — F02811 Dementia in other diseases classified elsewhere, unspecified severity, with agitation: Secondary | ICD-10-CM | POA: Diagnosis not present

## 2021-04-19 DIAGNOSIS — K219 Gastro-esophageal reflux disease without esophagitis: Secondary | ICD-10-CM | POA: Diagnosis not present

## 2021-04-19 DIAGNOSIS — G20 Parkinson's disease: Secondary | ICD-10-CM | POA: Diagnosis not present

## 2021-04-19 DIAGNOSIS — E119 Type 2 diabetes mellitus without complications: Secondary | ICD-10-CM | POA: Diagnosis not present

## 2021-04-19 DIAGNOSIS — I482 Chronic atrial fibrillation, unspecified: Secondary | ICD-10-CM | POA: Diagnosis not present

## 2021-04-19 DIAGNOSIS — E785 Hyperlipidemia, unspecified: Secondary | ICD-10-CM | POA: Diagnosis not present

## 2021-04-19 DIAGNOSIS — Z7984 Long term (current) use of oral hypoglycemic drugs: Secondary | ICD-10-CM | POA: Diagnosis not present

## 2021-04-19 DIAGNOSIS — E039 Hypothyroidism, unspecified: Secondary | ICD-10-CM | POA: Diagnosis not present

## 2021-04-20 DIAGNOSIS — E785 Hyperlipidemia, unspecified: Secondary | ICD-10-CM | POA: Diagnosis not present

## 2021-04-20 DIAGNOSIS — E119 Type 2 diabetes mellitus without complications: Secondary | ICD-10-CM | POA: Diagnosis not present

## 2021-04-20 DIAGNOSIS — I482 Chronic atrial fibrillation, unspecified: Secondary | ICD-10-CM | POA: Diagnosis not present

## 2021-04-20 DIAGNOSIS — E039 Hypothyroidism, unspecified: Secondary | ICD-10-CM | POA: Diagnosis not present

## 2021-04-20 DIAGNOSIS — F02811 Dementia in other diseases classified elsewhere, unspecified severity, with agitation: Secondary | ICD-10-CM | POA: Diagnosis not present

## 2021-04-20 DIAGNOSIS — I10 Essential (primary) hypertension: Secondary | ICD-10-CM | POA: Diagnosis not present

## 2021-04-20 DIAGNOSIS — K219 Gastro-esophageal reflux disease without esophagitis: Secondary | ICD-10-CM | POA: Diagnosis not present

## 2021-04-20 DIAGNOSIS — G20 Parkinson's disease: Secondary | ICD-10-CM | POA: Diagnosis not present

## 2021-04-20 DIAGNOSIS — Z7984 Long term (current) use of oral hypoglycemic drugs: Secondary | ICD-10-CM | POA: Diagnosis not present

## 2021-04-26 ENCOUNTER — Ambulatory Visit: Payer: Medicare Other | Admitting: Neurology

## 2021-04-26 DIAGNOSIS — K219 Gastro-esophageal reflux disease without esophagitis: Secondary | ICD-10-CM | POA: Diagnosis not present

## 2021-04-26 DIAGNOSIS — E039 Hypothyroidism, unspecified: Secondary | ICD-10-CM | POA: Diagnosis not present

## 2021-04-26 DIAGNOSIS — I10 Essential (primary) hypertension: Secondary | ICD-10-CM | POA: Diagnosis not present

## 2021-04-26 DIAGNOSIS — F02811 Dementia in other diseases classified elsewhere, unspecified severity, with agitation: Secondary | ICD-10-CM | POA: Diagnosis not present

## 2021-04-26 DIAGNOSIS — G20 Parkinson's disease: Secondary | ICD-10-CM | POA: Diagnosis not present

## 2021-04-26 DIAGNOSIS — Z7984 Long term (current) use of oral hypoglycemic drugs: Secondary | ICD-10-CM | POA: Diagnosis not present

## 2021-04-26 DIAGNOSIS — I482 Chronic atrial fibrillation, unspecified: Secondary | ICD-10-CM | POA: Diagnosis not present

## 2021-04-26 DIAGNOSIS — E785 Hyperlipidemia, unspecified: Secondary | ICD-10-CM | POA: Diagnosis not present

## 2021-04-26 DIAGNOSIS — E119 Type 2 diabetes mellitus without complications: Secondary | ICD-10-CM | POA: Diagnosis not present

## 2021-04-27 DIAGNOSIS — E119 Type 2 diabetes mellitus without complications: Secondary | ICD-10-CM | POA: Diagnosis not present

## 2021-04-27 DIAGNOSIS — I482 Chronic atrial fibrillation, unspecified: Secondary | ICD-10-CM | POA: Diagnosis not present

## 2021-04-27 DIAGNOSIS — G20 Parkinson's disease: Secondary | ICD-10-CM | POA: Diagnosis not present

## 2021-04-27 DIAGNOSIS — F02811 Dementia in other diseases classified elsewhere, unspecified severity, with agitation: Secondary | ICD-10-CM | POA: Diagnosis not present

## 2021-04-27 DIAGNOSIS — K219 Gastro-esophageal reflux disease without esophagitis: Secondary | ICD-10-CM | POA: Diagnosis not present

## 2021-04-27 DIAGNOSIS — Z7984 Long term (current) use of oral hypoglycemic drugs: Secondary | ICD-10-CM | POA: Diagnosis not present

## 2021-04-27 DIAGNOSIS — I10 Essential (primary) hypertension: Secondary | ICD-10-CM | POA: Diagnosis not present

## 2021-04-27 DIAGNOSIS — E785 Hyperlipidemia, unspecified: Secondary | ICD-10-CM | POA: Diagnosis not present

## 2021-04-27 DIAGNOSIS — E039 Hypothyroidism, unspecified: Secondary | ICD-10-CM | POA: Diagnosis not present

## 2021-04-28 ENCOUNTER — Telehealth: Payer: Self-pay | Admitting: Adult Health

## 2021-04-28 NOTE — Telephone Encounter (Signed)
Mitzi Hansen from [facility] call and stated he miss pt PT for today because pt was not feeling well.Mitzi Hansen 's # is [phone].

## 2021-04-28 NOTE — Telephone Encounter (Signed)
FYI

## 2021-05-02 DIAGNOSIS — I10 Essential (primary) hypertension: Secondary | ICD-10-CM | POA: Diagnosis not present

## 2021-05-02 DIAGNOSIS — F02811 Dementia in other diseases classified elsewhere, unspecified severity, with agitation: Secondary | ICD-10-CM | POA: Diagnosis not present

## 2021-05-02 DIAGNOSIS — E119 Type 2 diabetes mellitus without complications: Secondary | ICD-10-CM | POA: Diagnosis not present

## 2021-05-02 DIAGNOSIS — E785 Hyperlipidemia, unspecified: Secondary | ICD-10-CM | POA: Diagnosis not present

## 2021-05-02 DIAGNOSIS — E039 Hypothyroidism, unspecified: Secondary | ICD-10-CM | POA: Diagnosis not present

## 2021-05-02 DIAGNOSIS — G20 Parkinson's disease: Secondary | ICD-10-CM | POA: Diagnosis not present

## 2021-05-02 DIAGNOSIS — I482 Chronic atrial fibrillation, unspecified: Secondary | ICD-10-CM | POA: Diagnosis not present

## 2021-05-02 DIAGNOSIS — Z7984 Long term (current) use of oral hypoglycemic drugs: Secondary | ICD-10-CM | POA: Diagnosis not present

## 2021-05-02 DIAGNOSIS — K219 Gastro-esophageal reflux disease without esophagitis: Secondary | ICD-10-CM | POA: Diagnosis not present

## 2021-05-04 DIAGNOSIS — I10 Essential (primary) hypertension: Secondary | ICD-10-CM | POA: Diagnosis not present

## 2021-05-04 DIAGNOSIS — E785 Hyperlipidemia, unspecified: Secondary | ICD-10-CM | POA: Diagnosis not present

## 2021-05-04 DIAGNOSIS — K219 Gastro-esophageal reflux disease without esophagitis: Secondary | ICD-10-CM | POA: Diagnosis not present

## 2021-05-04 DIAGNOSIS — Z7984 Long term (current) use of oral hypoglycemic drugs: Secondary | ICD-10-CM | POA: Diagnosis not present

## 2021-05-04 DIAGNOSIS — E119 Type 2 diabetes mellitus without complications: Secondary | ICD-10-CM | POA: Diagnosis not present

## 2021-05-04 DIAGNOSIS — F02811 Dementia in other diseases classified elsewhere, unspecified severity, with agitation: Secondary | ICD-10-CM | POA: Diagnosis not present

## 2021-05-04 DIAGNOSIS — I482 Chronic atrial fibrillation, unspecified: Secondary | ICD-10-CM | POA: Diagnosis not present

## 2021-05-04 DIAGNOSIS — G20 Parkinson's disease: Secondary | ICD-10-CM | POA: Diagnosis not present

## 2021-05-04 DIAGNOSIS — E039 Hypothyroidism, unspecified: Secondary | ICD-10-CM | POA: Diagnosis not present

## 2021-05-07 ENCOUNTER — Other Ambulatory Visit: Payer: Self-pay | Admitting: Adult Health

## 2021-05-07 DIAGNOSIS — R195 Other fecal abnormalities: Secondary | ICD-10-CM

## 2021-05-07 DIAGNOSIS — K2100 Gastro-esophageal reflux disease with esophagitis, without bleeding: Secondary | ICD-10-CM

## 2021-05-08 DIAGNOSIS — E785 Hyperlipidemia, unspecified: Secondary | ICD-10-CM | POA: Diagnosis not present

## 2021-05-08 DIAGNOSIS — F02811 Dementia in other diseases classified elsewhere, unspecified severity, with agitation: Secondary | ICD-10-CM | POA: Diagnosis not present

## 2021-05-08 DIAGNOSIS — E039 Hypothyroidism, unspecified: Secondary | ICD-10-CM | POA: Diagnosis not present

## 2021-05-08 DIAGNOSIS — Z7984 Long term (current) use of oral hypoglycemic drugs: Secondary | ICD-10-CM | POA: Diagnosis not present

## 2021-05-08 DIAGNOSIS — E119 Type 2 diabetes mellitus without complications: Secondary | ICD-10-CM | POA: Diagnosis not present

## 2021-05-08 DIAGNOSIS — I482 Chronic atrial fibrillation, unspecified: Secondary | ICD-10-CM | POA: Diagnosis not present

## 2021-05-08 DIAGNOSIS — I10 Essential (primary) hypertension: Secondary | ICD-10-CM | POA: Diagnosis not present

## 2021-05-08 DIAGNOSIS — K219 Gastro-esophageal reflux disease without esophagitis: Secondary | ICD-10-CM | POA: Diagnosis not present

## 2021-05-08 DIAGNOSIS — G20 Parkinson's disease: Secondary | ICD-10-CM | POA: Diagnosis not present

## 2021-05-09 ENCOUNTER — Ambulatory Visit: Payer: Medicare Other | Admitting: Physician Assistant

## 2021-05-16 ENCOUNTER — Telehealth: Payer: Self-pay

## 2021-05-16 ENCOUNTER — Telehealth: Payer: Medicare Other

## 2021-05-16 DIAGNOSIS — E039 Hypothyroidism, unspecified: Secondary | ICD-10-CM | POA: Diagnosis not present

## 2021-05-16 DIAGNOSIS — K219 Gastro-esophageal reflux disease without esophagitis: Secondary | ICD-10-CM | POA: Diagnosis not present

## 2021-05-16 DIAGNOSIS — E119 Type 2 diabetes mellitus without complications: Secondary | ICD-10-CM | POA: Diagnosis not present

## 2021-05-16 DIAGNOSIS — I482 Chronic atrial fibrillation, unspecified: Secondary | ICD-10-CM | POA: Diagnosis not present

## 2021-05-16 DIAGNOSIS — E785 Hyperlipidemia, unspecified: Secondary | ICD-10-CM | POA: Diagnosis not present

## 2021-05-16 DIAGNOSIS — Z7984 Long term (current) use of oral hypoglycemic drugs: Secondary | ICD-10-CM | POA: Diagnosis not present

## 2021-05-16 DIAGNOSIS — I10 Essential (primary) hypertension: Secondary | ICD-10-CM | POA: Diagnosis not present

## 2021-05-16 DIAGNOSIS — F02811 Dementia in other diseases classified elsewhere, unspecified severity, with agitation: Secondary | ICD-10-CM | POA: Diagnosis not present

## 2021-05-16 DIAGNOSIS — G20 Parkinson's disease: Secondary | ICD-10-CM | POA: Diagnosis not present

## 2021-05-16 NOTE — Telephone Encounter (Signed)
  Care Management   Follow Up Note   05/16/2021 Name: Gerald Leach MRN: [id number] DOB: 08-20-1933   Referred by: [name], NP Reason for referral : Chronic Care Management (RNCM: Follow up Outreach Chronic Care Management and coordination needs-unsuccessful)   An unsuccessful telephone outreach was attempted today. The patient was referred to the case management team for assistance with care management and care coordination.   Follow Up Plan: The care management team will reach out to the patient again over the next 30 days.  Peter Garter RN, Jackquline Denmark, CDE Care Management Coordinator [facility] [phone], Mobile [phone]

## 2021-05-17 ENCOUNTER — Telehealth: Payer: Self-pay | Admitting: Neurology

## 2021-05-17 ENCOUNTER — Other Ambulatory Visit: Payer: Self-pay | Admitting: Adult Health

## 2021-05-17 DIAGNOSIS — M2042 Other hammer toe(s) (acquired), left foot: Secondary | ICD-10-CM | POA: Diagnosis not present

## 2021-05-17 DIAGNOSIS — L97512 Non-pressure chronic ulcer of other part of right foot with fat layer exposed: Secondary | ICD-10-CM | POA: Diagnosis not present

## 2021-05-17 DIAGNOSIS — M2041 Other hammer toe(s) (acquired), right foot: Secondary | ICD-10-CM | POA: Diagnosis not present

## 2021-05-18 ENCOUNTER — Ambulatory Visit (INDEPENDENT_AMBULATORY_CARE_PROVIDER_SITE_OTHER): Payer: Medicare Other

## 2021-05-18 DIAGNOSIS — E783 Hyperchylomicronemia: Secondary | ICD-10-CM

## 2021-05-18 DIAGNOSIS — G40909 Epilepsy, unspecified, not intractable, without status epilepticus: Secondary | ICD-10-CM

## 2021-05-18 DIAGNOSIS — R296 Repeated falls: Secondary | ICD-10-CM

## 2021-05-18 DIAGNOSIS — F0280 Dementia in other diseases classified elsewhere without behavioral disturbance: Secondary | ICD-10-CM

## 2021-05-18 DIAGNOSIS — E119 Type 2 diabetes mellitus without complications: Secondary | ICD-10-CM

## 2021-05-18 DIAGNOSIS — G20 Parkinson's disease: Secondary | ICD-10-CM

## 2021-05-18 NOTE — Patient Instructions (Signed)
Visit Information  Thank you for taking time to visit with me today. Please don't hesitate to contact me if I can be of assistance to you before our next scheduled telephone appointment.  Following are the goals we discussed today:  Take all medications as prescribed Attend all scheduled provider appointments Call provider office for new concerns or questions  check blood sugar at prescribed times: when you have symptoms of low or high blood sugar and 1-2 times a week check feet daily for cuts, sores or redness take the blood sugar log to all doctor visits drink 6 to 8 glasses of water each day wash and dry feet carefully every day call for medicine refill 2 or 3 days before it runs out take all medications exactly as prescribed call doctor with any symptoms you believe are related to your medicine - always use handrails on the stairs - install bathroom grab bars - keep a flashlight by the bed - keep cell phone with me always - make an emergency alert plan in case I fall - pick up clutter from the floors - remove throw rugs or use nonslip pads - use a cane or walker - use nightlight in the bathroom, halls - always wear low-heeled or flat shoes or slippers with nonskid soles Our next appointment is by telephone on 07/13/21 at 10 AM  Please call the care guide team at [phone] if you need to cancel or reschedule your appointment.   If you are experiencing a Mental Health or [place] or need someone to talk to, please call the Suicide and Crisis Lifeline: 988 call 1-800-273-TALK (toll free, 24 hour hotline) go to [facility] [address] [place] [id number]) call 911   The patient verbalized understanding of instructions, educational materials, and care plan provided today and declined offer to receive copy of patient instructions, educational materials, and care plan.   Peter Garter RN, Jackquline Denmark, CDE Care Management  Coordinator [facility] [phone], Mobile [phone]

## 2021-05-18 NOTE — Telephone Encounter (Signed)
Refill will be sent, but he needs to schedule a follow-up visit (OK for VV).

## 2021-05-18 NOTE — Chronic Care Management (AMB) (Signed)
Chronic Care Management   CCM RN Visit Note  05/18/2021 Name: Gerald Leach MRN: [id number] DOB: [birth year]  Subjective: Gerald Leach is a 87 y.o. year old male who is a primary care patient of [name], NP. The care management team was consulted for assistance with disease management and care coordination needs.    Engaged with patient by telephone for follow up visit in response to provider referral for case management and/or care coordination services.   Consent to Services:  The patient was given information about Chronic Care Management services, agreed to services, and gave verbal consent prior to initiation of services.  Please see initial visit note for detailed documentation.   Patient agreed to services and verbal consent obtained.   Assessment: Review of patient past medical history, allergies, medications, health status, including review of consultants reports, laboratory and other test data, was performed as part of comprehensive evaluation and provision of chronic care management services.   SDOH (Social Determinants of Health) assessments and interventions performed:    CCM Care Plan  No Known Allergies  Outpatient Encounter Medications as of 05/18/2021  Medication Sig Note   acetaminophen (TYLENOL) 325 MG tablet Take 2 tablets (650 mg total) by mouth every 6 (six) hours as needed for mild pain (or Fever >/= 101).    carbidopa-levodopa (SINEMET IR) 25-100 MG tablet TAKE 1 TABLET BY MOUTH THREE TIMES A DAY 03/18/2021: LF 12/25/20 #270/90   lacosamide 150 MG TABS Take 1 tablet (150 mg total) by mouth 2 (two) times daily.    levETIRAcetam (KEPPRA) 750 MG tablet TAKE 2 TABLETS (1,500 MG TOTAL) BY MOUTH 2 (TWO) TIMES DAILY. 03/18/2021: LF 09/17/20 #120/30   levothyroxine (SYNTHROID) 75 MCG tablet TAKE 1 TABLET BY MOUTH EVERY DAY 03/18/2021: LF 01/13/21 #90/90   metFORMIN (GLUCOPHAGE) 500 MG tablet TAKE 1 TABLET (500 MG TOTAL) BY MOUTH 2 (TWO) TIMES DAILY WITH A MEAL. NEED  APPOINTMENT    omeprazole (PRILOSEC) 20 MG capsule TAKE 1 CAPSULE BY MOUTH EVERY DAY    ondansetron (ZOFRAN) 4 MG tablet Take 1 tablet (4 mg total) by mouth every 6 (six) hours as needed for nausea.    QUEtiapine (SEROQUEL) 25 MG tablet Take 0.5 tablets (12.5 mg total) by mouth at bedtime.    vitamin B-12 (CYANOCOBALAMIN) 1000 MCG tablet Take 1,000 mcg by mouth daily.    [DISCONTINUED] amantadine (SYMMETREL) 100 MG capsule TAKE 1 CAPSULE BY MOUTH EVERY DAY (Patient taking differently: Take 100 mg by mouth daily.) 03/18/2021: LF 02/27/21 #90/90   No facility-administered encounter medications on file as of 05/18/2021.    Patient Active Problem List   Diagnosis Date Noted   Breakthrough seizure ([place]) 03/19/2021   Seizure ([place]) 03/18/2021   Dementia due to Parkinson'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 behavioral disturbance ([place]) 08/16/2020   Fall 08/11/2020   Closed compression fracture of L5 lumbar vertebra, initial encounter ([employer]) 08/11/2020   Seizures ([place]) 03/05/2018   Leukocytosis 03/05/2018   Diabetic foot ulcers ([place]) 04/23/2017   Parkinson disease ([place])    Gait instability    Diabetes mellitus type 2, controlled ([place]) 08/05/2015   Hypothyroidism 08/05/2015   Fall at home 10/02/2014   CNS aneurysm s/p coiling ([facility], 2002) 10/02/2014   High grade T7 central canal stenosis with T7 vertebral fracture s/p fall (02/2012) 10/02/2014   Right clinoid calcified meningioma vs thrombosed giant ICA aneurysm, conservative management. 10/02/2014   Syncope 02/05/2014   Drug-induced delirium(292.81) 05/16/2013   Seizure disorder ([place]) 04/21/2013   Seizure disorder, grand  mal ([place]) 03/24/2013   Cervical compression fracture---C7 10/29/2012   Dysphagia 09/21/2012   Subdural hematoma 09/20/2012   SOLAR KERATOSIS 02/17/2009   CARCINOMA, SKIN, SQUAMOUS CELL, FACE 04/19/2008   Hyperlipidemia, group D 01/27/2008   COLONIC POLYPS 11/15/2006   GERD 11/15/2006    Conditions to be addressed/monitored:HLD, DMII,  Dementia, and Parkinson disease, seizures  Care Plan : RNCM:Neurological disease( Parkinson's disease, seizures, dementia)  Updates made by Dimitri Ped, RN since 05/18/2021 12:00 AM  Completed 05/18/2021   Problem: Fall Risk related to Neurological disease( Parkinson's disease, seizures, dementia) Resolved 05/18/2021  Priority: High     Long-Range Goal: Reduction of Fall and Fall-Related Injury due to Neurological disease( Parkinson's disease, seizures, dementia) Completed 05/18/2021  Start Date: 01/27/2021  Expected End Date: 06/18/2021  Recent Progress: On track  Priority: High  Note:   Resolving due to duplicate goal  Current Barriers:  Knowledge Deficits related to fall precautions in patient with Neurological disease( Parkinson's disease, seizures, dementia) Decreased adherence to prescribed treatment for fall prevention Unable to independently self manage Neurological disease( Parkinson's disease, seizures, dementia) Unable to perform ADLs independently Unable to perform IADLs independently Knowledge Deficits related to self management of Neurological disease( Parkinson's disease, seizures, dementia) and fall prevention Chronic Disease Management support and education needs related to self management of Neurological disease( Parkinson's disease, seizures, dementia) and fall prevention Cognitive Deficits Spoke with son Gerald Leach.     States he does using a walker.  States he has a paid caregiver from 1-5 who helps with cooking, cleaning and reminds pt to take his medications.   Son and daughter share duties caring for pt.  Pt was admitted to hospital 03/17/21-03/24/21 for seizures and is currently at [facility] for rehab.  Son states that pt is going home on Saturday.  States that he is to get home health therapy.  States pt is back to what he was before going to the hospital.  States he has not had any seizures or falls while at rehab.  States pt his eating good and his mood is  good. States pt will be alone some but he will still have his paid care giver from 1-5:30 and he and his sister will be checking on him frequently Clinical Goal(s):  patient will demonstrate improved adherence to prescribed treatment plan for decreasing falls as evidenced by patient reporting and review of EMR patient will verbalize using fall risk reduction strategies discussed patient will not experience additional falls patient will verbalize understanding of plan for self management of Neurological disease( Parkinson's disease, seizures, dementia) and fall prevention patient will meet with RN Care Manager to address self management of Neurological disease( Parkinson's disease, seizures, dementia) and fall prevention patient will take all medications exactly as prescribed and will call provider for medication related questions patient will demonstrate improved adherence to prescribed treatment plan for self management of Neurological disease( Parkinson's disease, seizures, dementia) and fall prevention patient will verbalize basic understanding of self management of Neurological disease( Parkinson's disease, seizures, dementia) and fall prevention disease process and self health management plan patient will work with CM team pharmacist to address cost of medications and adherence issues patient will work with CM clinical social worker to provide caregiver support and resources-completed referral with resources given Interventions:  Collaboration with [name], NP regarding development and update of comprehensive plan of care as evidenced by provider attestation and co-signature Inter-disciplinary care team collaboration (see longitudinal plan of care) Provided written and verbal  education re: Potential causes of falls and Fall prevention strategies Reviewed medications and discussed potential side effects of medications such as dizziness and frequent urination Assessed for s/s of  orthostatic hypotension Assessed for falls since last encounter Assessed patients knowledge of fall risk prevention secondary to previously provided education. Evaluation of current treatment plan related to self management of Neurological disease( Parkinson's disease, seizures, dementia) and fall prevention and patient's adherence to plan as established by provider. Reinforced education to patient re: self management of Neurological disease( Parkinson's disease, seizures, dementia) and fall prevention Reviewed medications with patient and discussed continued adherence Social Work referral for provide caregiver support and resources-LCSW completed referral with resources given-Reviewed that LCSW can assist again for caregiver stress if needed Pharmacy referral for to address cost of medications and adherence issues-completed visit on 02/07/21 Discussed plans with patient for ongoing care management follow up and provided patient with direct contact information for care management team Reviewed to pick appointment with Beaulah Dinning NP on 04/11/21 and  neurology on 04/26/21 Self-Care Deficits:  Unable to self administer medications as prescribed Unable to perform ADLs independently Unable to perform IADLs independently Patient Goals:  - Utilize walker (assistive device) appropriately with all ambulation - De-clutter walkways - Change positions slowly - Wear secure fitting shoes at all times with ambulation - Utilize home lighting for dim lit areas - Demonstrate self and pet awareness at all times - always use handrails on the stairs - install bathroom grab bars - keep a flashlight by the bed - keep cell phone with me always - make an emergency alert plan in case I fall - pick up clutter from the floors - remove throw rugs or use nonslip pads - use a cane or walker - use nightlight in the bathroom, halls - always wear low-heeled or flat shoes or slippers with nonskid soles Follow Up Plan:  Telephone follow up appointment with care management team member scheduled for: 05/16/21 at 9 AM The patient has been provided with contact information for the care management team and has been advised to call with any health related questions or concerns.      Care Plan : RNCM:Diabetes Type 2 (Adult)  Updates made by Dimitri Ped, RN since 05/18/2021 12:00 AM  Completed 05/18/2021   Problem: Lack of long term self managment of  Type 2 Diabetes Resolved 05/18/2021  Priority: Medium     Long-Range Goal: Effective long term self managment of  Type 2 Diabetes Completed 05/18/2021  Start Date: 01/27/2021  Expected End Date: 06/18/2021  Recent Progress: On track  Priority: Medium  Note:   Resolving due to duplicate goal  Objective:  Lab Results  Component Value Date   HGBA1C 5.6 08/11/2020   Lab Results  Component Value Date   CREATININE 0.85 08/23/2020   CREATININE 0.74 08/12/2020   CREATININE 0.87 08/11/2020   No results found for: EGFR Current Barriers:  Knowledge Deficits related to basic Diabetes pathophysiology and self care/management Knowledge Deficits related to medications used for management of diabetes Cognitive Deficits Does not use cbg meter  Unable to independently self manage Type 2 Diabetes Unable to perform ADLs independently Unable to perform IADLs independently Spoke with son Gerald Leach Designated Party Release. States he checked his CBG about 2 weeks ago and it was 110.  States he like to eat cereal for breakfast and his favorite type is cinnamon toast crunch.  States pt will eat breakfast, not much for lunch and a good supper of  a meat and 2 vegetables.  States he does drink Ensure sometimes.  04/07/21 Son states that rehab has been checking pts CBG 3 times a day and his readings have been very good.  States appetite is good. Case Manager Clinical Goal(s):  patient will demonstrate improved adherence to prescribed treatment plan for diabetes self  care/management as evidenced by: adherence to ADA/ carb modified diet adherence to prescribed medication regimen contacting provider for new or worsened symptoms or questions Interventions:  Collaboration with Carlisle Cater, Tommi Rumps, NP regarding development and update of comprehensive plan of care as evidenced by provider attestation and co-signature Inter-disciplinary care team collaboration (see longitudinal plan of care) Reinforced  education to patient about basic DM disease process Reviewed medications with patient and discussed importance of medication adherence Discussed plans with patient for ongoing care management follow up and provided patient with direct contact information for care management team Reinforced s/sx of  hypoglycemia and hyperglycemia and importance of correct treatment Referral made to pharmacy team for assistance with issues with cost of medications and adherence completed visit 02/07/21 Referral made to social work team for assistance with caregiver support, level of care issues and community resources-completed referral with resources given Review of patient status, including review of consultants reports, relevant laboratory and other test results, and medications completed. Reinforced to try diabetic nutritional supplements to support his nutritional needs Self-Care Activities - Self administers oral medications as prescribed Attends all scheduled provider appointments Adheres to prescribed ADA/carb modified Patient Goals: - change to whole grain breads, cereal, pasta - drink 6 to 8 glasses of water each day - fill half of plate with vegetables - manage portion size - read food labels for fat, fiber, carbohydrates and portion size - reduce red meat to 2 to 3 times a week - switch to sugar-free drinks - schedule appointment with eye doctor - check feet daily for cuts, sores or redness - wash and dry feet carefully every day - wear comfortable, cotton socks - wear  comfortable, well-fitting shoes -drink diabetic nutritional supplement daily as needed Follow Up Plan: Telephone follow up appointment with care management team member scheduled for: 05/16/21 at 9 AM The patient has been provided with contact information for the care management team and has been advised to call with any health related questions or concerns.      Care Plan : RN Care Manager Plan of Care  Updates made by Dimitri Ped, RN since 05/18/2021 12:00 AM     Problem: Chronic Disease Management and Care Coordination Needs (Seizures, Parkinson disease, DM. HLD)   Priority: High     Long-Range Goal: Establish Plan of Care for Chronic Disease Management Needs (Seizures, Parkinson disease, DM. HLD)   Start Date: 05/18/2021  Expected End Date: 11/14/2021  Priority: High  Note:   Current Barriers:  Care Coordination needs related to ADL IADL limitations, Memory Deficits, Inability to perform ADL's independently, and Inability to perform IADL's independently Chronic Disease Management support and education needs related to HLD, DMII, and Parkinson disease, seizures,dementia  Cognitive Deficits Spoke with son Gerald Leach.     States pt is using a walker.  States he has a paid caregiver from 1-5 who helps with cooking, cleaning and reminds pt to take his medications.   Son and daughter share duties caring for pt.  States pt has not had any seizures or falls since he went to the hospital.  States pt his eating good and his mood is good. States he and his  sister will are checking on pt  frequently.  States pt is good at remembering to take his medications in the morning and his caregiver makes sure he takes him medications in the evening.  RNCM Clinical Goal(s):  Patient will verbalize basic understanding of  HLD, DMII, and Parkinson disease, seizures  disease process and self health management plan as evidenced by voiced adherence to plan of care take all medications exactly as prescribed  and will call provider for medication related questions as evidenced by dispense report and pt verbalization attend all scheduled medical appointments: Cardiology 08/02/21 as evidenced by review of medical records demonstrate Improved adherence to prescribed treatment plan for HLD, DMII, and Parkinson disease, seizures, dementia  as evidenced by readings within limits, voiced adherence to plan of care continue to work with RN Care Manager to address care management and care coordination needs related to  HLD, DMII, and Parkinson disease, seizures, dementia  as evidenced by adherence to CM Team Scheduled appointments through collaboration with RN Care manager, provider, and care team.   Interventions: 1:1 collaboration with primary care provider regarding development and update of comprehensive plan of care as evidenced by provider attestation and co-signature Inter-disciplinary care team collaboration (see longitudinal plan of care) Evaluation of current treatment plan related to  self management and patient's adherence to plan as established by provider   Diabetes Interventions:  (Status:  Goal on track:  Yes.) Long Term Goal Assessed patient's understanding of A1c goal: <7% Provided education to patient about basic DM disease process Reviewed medications with patient and discussed importance of medication adherence Counseled on importance of regular laboratory monitoring as prescribed Discussed plans with patient for ongoing care management follow up and provided patient with direct contact information for care management team Advised patient, providing education and rationale, to check cbg 1-2 times a week and record, calling provider for findings outside established parameters Review of patient status, including review of consultants reports, relevant laboratory and other test results, and medications completed Lab Results  Component Value Date   HGBA1C 5.4 03/18/2021   Falls Interventions:   (Status:  Goal on track:  Yes.) Long Term Goal Reviewed medications and discussed potential side effects of medications such as dizziness and frequent urination Advised patient of importance of notifying provider of falls Assessed for signs and symptoms of orthostatic hypotension Assessed for falls since last encounter Assessed patients knowledge of fall risk prevention secondary to previously provided education   Seizures  (Status:  Goal on track:  Yes.)  Long Term Goal Evaluation of current treatment plan related to  Seizures , ADL IADL limitations and Memory Deficits self-management and patient's adherence to plan as established by provider. Discussed plans with patient for ongoing care management follow up and provided patient with direct contact information for care management team Evaluation of current treatment plan related to seizures and patient's adherence to plan as established by provider Advised patient to call to schedule follow up with neurologist Discussed plans with patient for ongoing care management follow up and provided patient with direct contact information for care management team  Hyperlipidemia Interventions:  (Status:  Goal on track:  Yes.) Long Term Goal Medication review performed; medication list updated in electronic medical record.  Provider established cholesterol goals reviewed Counseled on importance of regular laboratory monitoring as prescribed Reviewed importance of limiting foods high in cholesterol  Patient Goals/Self-Care Activities: Take all medications as prescribed Attend all scheduled provider appointments Call provider office for new concerns or questions  check blood sugar at prescribed times: when you have symptoms of low or high blood sugar and 1-2 times a week check feet daily for cuts, sores or redness take the blood sugar log to all doctor visits drink 6 to 8 glasses of water each day wash and dry feet carefully every day call for  medicine refill 2 or 3 days before it runs out take all medications exactly as prescribed call doctor with any symptoms you believe are related to your medicine - always use handrails on the stairs - install bathroom grab bars - keep a flashlight by the bed - keep cell phone with me always - make an emergency alert plan in case I fall - pick up clutter from the floors - remove throw rugs or use nonslip pads - use a cane or walker - use nightlight in the bathroom, halls - always wear low-heeled or flat shoes or slippers with nonskid soles  Follow Up Plan:  Telephone follow up appointment with care management team member scheduled for:  07/13/21 The patient has been provided with contact information for the care management team and has been advised to call with any health related questions or concerns.       Plan:Telephone follow up appointment with care management team member scheduled for:  07/13/21 The patient has been provided with contact information for the care management team and has been advised to call with any health related questions or concerns.  Peter Garter RN, Jackquline Denmark, CDE Care Management Coordinator [facility] [phone], Mobile [phone]

## 2021-05-22 NOTE — Telephone Encounter (Signed)
Pt has a follow up appt sch for 3-13 -23

## 2021-05-31 ENCOUNTER — Telehealth: Payer: Self-pay | Admitting: Neurology

## 2021-05-31 MED ORDER — LACOSAMIDE 150 MG PO TABS: 150.0000 mg | ORAL_TABLET | Freq: Two times a day (BID) | ORAL | 5 refills | Status: DC

## 2021-05-31 NOTE — Telephone Encounter (Signed)
Lacosamide sent to pharmacy, as requested.  The past prescription was written by the hospital physicians.

## 2021-05-31 NOTE — Telephone Encounter (Signed)
Pt's son called in wanting a refill of the pt's Lacosamide. He thought [name] was the prescribing physician. The patient is out. The pharmacy is the [facility] on [address]

## 2021-05-31 NOTE — Telephone Encounter (Signed)
Called patients son [name] and informed him that patients Lacosamide was sent to the [facility] on [address]. Patients son had no further questions or concerns.

## 2021-06-02 ENCOUNTER — Telehealth: Payer: Self-pay | Admitting: Adult Health

## 2021-06-02 NOTE — Telephone Encounter (Signed)
Mitzi Hansen PT with [facility] is calling and needs verbal orders for PT 1x3

## 2021-06-07 NOTE — Telephone Encounter (Signed)
Spoke with Mitzi Hansen, gave VO per [name], NP.

## 2021-06-14 ENCOUNTER — Ambulatory Visit (INDEPENDENT_AMBULATORY_CARE_PROVIDER_SITE_OTHER): Payer: Medicare Other

## 2021-06-14 ENCOUNTER — Telehealth: Payer: Self-pay

## 2021-06-14 DIAGNOSIS — Z0000 Encounter for general adult medical examination without abnormal findings: Secondary | ICD-10-CM | POA: Diagnosis not present

## 2021-06-14 NOTE — Patient Instructions (Addendum)
Gerald Leach , Thank you for taking time to come for your Medicare Wellness Visit. I appreciate your ongoing commitment to your health goals. Please review the following plan we discussed and let me know if I can assist you in the future.   These are the goals we discussed:  Goals   None     This is a list of the screening recommended for you and due dates:  Health Maintenance  Topic Date Due   Eye exam for diabetics  04/21/2013   Complete foot exam   07/24/2018   COVID-19 Vaccine (1) 06/30/2021*   Urine Protein Check  07/15/2021*   Zoster (Shingles) Vaccine (1 of 2) 09/12/2021*   Flu Shot  09/15/2021*   Tetanus Vaccine  06/14/2022*   Hemoglobin A1C  09/16/2021   Pneumonia Vaccine  Completed   HPV Vaccine  Aged Out  *Topic was postponed. The date shown is not the original due date.   Advanced directives: Yes  Conditions/risks identified: None  Next appointment: Follow up in one year for your annual wellness visit.   Preventive Care 41 Years and Older, Male Preventive care refers to lifestyle choices and visits with your health care provider that can promote health and wellness. What does preventive care include? A yearly physical exam. This is also called an annual well check. Dental exams once or twice a year. Routine eye exams. Ask your health care provider how often you should have your eyes checked. Personal lifestyle choices, including: Daily care of your teeth and gums. Regular physical activity. Eating a healthy diet. Avoiding tobacco and drug use. Limiting alcohol use. Practicing safe sex. Taking low doses of aspirin every day. Taking vitamin and mineral supplements as recommended by your health care provider. What happens during an annual well check? The services and screenings done by your health care provider during your annual well check will depend on your age, overall health, lifestyle risk factors, and family history of disease. Counseling  Your health  care provider may ask you questions about your: Alcohol use. Tobacco use. Drug use. Emotional well-being. Home and relationship well-being. Sexual activity. Eating habits. History of falls. Memory and ability to understand (cognition). Work and work Statistician. Screening  You may have the following tests or measurements: Height, weight, and BMI. Blood pressure. Lipid and cholesterol levels. These may be checked every 5 years, or more frequently if you are over 73 years old. Skin check. Lung cancer screening. You may have this screening every year starting at age 43 if you have a 30-pack-year history of smoking and currently smoke or have quit within the past 15 years. Fecal occult blood test (FOBT) of the stool. You may have this test every year starting at age 16. Flexible sigmoidoscopy or colonoscopy. You may have a sigmoidoscopy every 5 years or a colonoscopy every 10 years starting at age 30. Prostate cancer screening. Recommendations will vary depending on your family history and other risks. Hepatitis C blood test. Hepatitis B blood test. Sexually transmitted disease (STD) testing. Diabetes screening. This is done by checking your blood sugar (glucose) after you have not eaten for a while (fasting). You may have this done every 1-3 years. Abdominal aortic aneurysm (AAA) screening. You may need this if you are a current or former smoker. Osteoporosis. You may be screened starting at age 38 if you are at high risk. Talk with your health care provider about your test results, treatment options, and if necessary, the need for more tests. Vaccines  Your health care provider may recommend certain vaccines, such as: Influenza vaccine. This is recommended every year. Tetanus, diphtheria, and acellular pertussis (Tdap, Td) vaccine. You may need a Td booster every 10 years. Zoster vaccine. You may need this after age 41. Pneumococcal 13-valent conjugate (PCV13) vaccine. One dose is  recommended after age 79. Pneumococcal polysaccharide (PPSV23) vaccine. One dose is recommended after age 45. Talk to your health care provider about which screenings and vaccines you need and how often you need them. This information is not intended to replace advice given to you by your health care provider. Make sure you discuss any questions you have with your health care provider. Document Released: 07/01/2015 Document Revised: 02/22/2016 Document Reviewed: 04/05/2015 Elsevier Interactive Patient Education  2017 [employer] Prevention in the Home Falls can cause injuries. They can happen to people of all ages. There are many things you can do to make your home safe and to help prevent falls. What can I do on the outside of my home? Regularly fix the edges of walkways and driveways and fix any cracks. Remove anything that might make you trip as you walk through a door, such as a raised step or threshold. Trim any bushes or trees on the path to your home. Use bright outdoor lighting. Clear any walking paths of anything that might make someone trip, such as rocks or tools. Regularly check to see if handrails are loose or broken. Make sure that both sides of any steps have handrails. Any raised decks and porches should have guardrails on the edges. Have any leaves, snow, or ice cleared regularly. Use sand or salt on walking paths during winter. Clean up any spills in your garage right away. This includes oil or grease spills. What can I do in the bathroom? Use night lights. Install grab bars by the toilet and in the tub and shower. Do not use towel bars as grab bars. Use non-skid mats or decals in the tub or shower. If you need to sit down in the shower, use a plastic, non-slip stool. Keep the floor dry. Clean up any water that spills on the floor as soon as it happens. Remove soap buildup in the tub or shower regularly. Attach bath mats securely with double-sided non-slip rug  tape. Do not have throw rugs and other things on the floor that can make you trip. What can I do in the bedroom? Use night lights. Make sure that you have a light by your bed that is easy to reach. Do not use any sheets or blankets that are too big for your bed. They should not hang down onto the floor. Have a firm chair that has side arms. You can use this for support while you get dressed. Do not have throw rugs and other things on the floor that can make you trip. What can I do in the kitchen? Clean up any spills right away. Avoid walking on wet floors. Keep items that you use a lot in easy-to-reach places. If you need to reach something above you, use a strong step stool that has a grab bar. Keep electrical cords out of the way. Do not use floor polish or wax that makes floors slippery. If you must use wax, use non-skid floor wax. Do not have throw rugs and other things on the floor that can make you trip. What can I do with my stairs? Do not leave any items on the stairs. Make sure that there are  handrails on both sides of the stairs and use them. Fix handrails that are broken or loose. Make sure that handrails are as long as the stairways. Check any carpeting to make sure that it is firmly attached to the stairs. Fix any carpet that is loose or worn. Avoid having throw rugs at the top or bottom of the stairs. If you do have throw rugs, attach them to the floor with carpet tape. Make sure that you have a light switch at the top of the stairs and the bottom of the stairs. If you do not have them, ask someone to add them for you. What else can I do to help prevent falls? Wear shoes that: Do not have high heels. Have rubber bottoms. Are comfortable and fit you well. Are closed at the toe. Do not wear sandals. If you use a stepladder: Make sure that it is fully opened. Do not climb a closed stepladder. Make sure that both sides of the stepladder are locked into place. Ask someone to  hold it for you, if possible. Clearly mark and make sure that you can see: Any grab bars or handrails. First and last steps. Where the edge of each step is. Use tools that help you move around (mobility aids) if they are needed. These include: Canes. Walkers. Scooters. Crutches. Turn on the lights when you go into a dark area. Replace any light bulbs as soon as they burn out. Set up your furniture so you have a clear path. Avoid moving your furniture around. If any of your floors are uneven, fix them. If there are any pets around you, be aware of where they are. Review your medicines with your doctor. Some medicines can make you feel dizzy. This can increase your chance of falling. Ask your doctor what other things that you can do to help prevent falls. This information is not intended to replace advice given to you by your health care provider. Make sure you discuss any questions you have with your health care provider. Document Released: 03/31/2009 Document Revised: 11/10/2015 Document Reviewed: 07/09/2014 Elsevier Interactive Patient Education  2017 [employer].

## 2021-06-14 NOTE — Progress Notes (Addendum)
Subjective:   Gerald Leach is a 87 y.o. male who presents for Medicare Annual/Subsequent preventive examination.  Review of Systems    No ROS      Objective:    There were no vitals filed for this visit. There is no height or weight on file to calculate BMI.  Advanced Directives 03/17/2021 01/27/2021 01/17/2021 12/09/2020 08/23/2020 06/06/2020 10/09/2019  Does Patient Have a Medical Advance Directive? No Yes Yes No No No Yes  Type of Advance Directive - [place];Living will [place];Living will - - - Living will;Healthcare Power of Attorney  Does patient want to make changes to medical advance directive? - No - Guardian declined No - Guardian declined - - - -  Copy of Healthcare Power of Attorney in Chart? - No - copy requested No - copy requested - - - -  Would patient like information on creating a medical advance directive? No - Patient declined - - - No - Patient declined - -  Pre-existing out of facility DNR order (yellow form or pink MOST form) - - - - - - -    Current Medications (verified) Outpatient Encounter Medications as of 06/14/2021  Medication Sig   acetaminophen (TYLENOL) 325 MG tablet Take 2 tablets (650 mg total) by mouth every 6 (six) hours as needed for mild pain (or Fever >/= 101).   amantadine (SYMMETREL) 100 MG capsule Take 1 capsule (100 mg total) by mouth daily.   carbidopa-levodopa (SINEMET IR) 25-100 MG tablet TAKE 1 TABLET BY MOUTH THREE TIMES A DAY   Lacosamide 150 MG TABS Take 1 tablet (150 mg total) by mouth 2 (two) times daily.   levETIRAcetam (KEPPRA) 750 MG tablet TAKE 2 TABLETS (1,500 MG TOTAL) BY MOUTH 2 (TWO) TIMES DAILY.   levothyroxine (SYNTHROID) 75 MCG tablet TAKE 1 TABLET BY MOUTH EVERY DAY   metFORMIN (GLUCOPHAGE) 500 MG tablet TAKE 1 TABLET (500 MG TOTAL) BY MOUTH 2 (TWO) TIMES DAILY WITH A MEAL. NEED APPOINTMENT   omeprazole (PRILOSEC) 20 MG capsule TAKE 1 CAPSULE BY MOUTH EVERY DAY   ondansetron  (ZOFRAN) 4 MG tablet Take 1 tablet (4 mg total) by mouth every 6 (six) hours as needed for nausea.   QUEtiapine (SEROQUEL) 25 MG tablet Take 0.5 tablets (12.5 mg total) by mouth at bedtime.   vitamin B-12 (CYANOCOBALAMIN) 1000 MCG tablet Take 1,000 mcg by mouth daily.   No facility-administered encounter medications on file as of 06/14/2021.    Allergies (verified) Patient has no known allergies.   History: Past Medical History:  Diagnosis Date   Brain aneurysm    Colonic polyp 2003   Dementia ([place])    Diabetes mellitus without complication [facility])    ED (erectile dysfunction)    GERD (gastroesophageal reflux disease)    Hyperlipidemia    Hypertension    Hypothyroidism    Seizures ([place])    per family   Seizures [facility])    Past Surgical History:  Procedure Laterality Date   Aneurysmal clipping     brain aneurysm surgery     at [facility] 2002   carotid artery aneurysm     COLONOSCOPY     polyps   CRANIOTOMY Right 09/19/2012   Procedure: CRANIOTOMY HEMATOMA EVACUATION SUBDURAL;  Surgeon: Otilio Connors, MD;  Location: [facility]URO ORS;  Service: Neurosurgery;  Laterality: Right;   CRANIOTOMY Right 09/22/2012   Procedure: CRANIOTOMY HEMATOMA EVACUATION SUBDURAL;  Surgeon: Otilio Connors, MD;  Location: [facility]  ORS;  Service: Neurosurgery;  Laterality: Right;   Craniotomy.     right hernia     Family History  Problem Relation Age of Onset   Liver disease Brother        Died   Seizures Neg Hx    Social History   Socioeconomic History   Marital status: Widowed    Spouse name: Not on file   Number of children: 4   Years of education: 50   Highest education level: 12th grade  Occupational History   Occupation: retired  Tobacco Use   Smoking status: Never    Passive exposure: Past   Smokeless tobacco: Never  Vaping Use   Vaping Use: Never used  Substance and Sexual Activity   Alcohol use: Yes    Alcohol/week: 0.0 standard drinks    Comment: rum mixed in diet coke 3 a week    Drug use: Yes    Types: Nitrous oxide   Sexual activity: Not Currently  Other Topics Concern   Not on file  Social History Narrative   ** Merged History Encounter **       Lives with wife in a 2 story home.  Has no trouble with the stairs as long as he holds on to the banister.  Education: college.  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     Social Determinants of Health   Financial Resource Strain: Low Risk    Difficulty of Paying Living Expenses: Not hard at all  Food Insecurity: No Food Insecurity   Worried About Charity fundraiser in the Last Year: Never true   [place] in the Last Year: Never true  Transportation Needs: No Transportation Needs   Lack of Transportation (Medical): No   Lack of Transportation (Non-Medical): No  Physical Activity: Inactive   Days of Exercise per Week: 0 days   Minutes of Exercise per Session: 0 min  Stress: No Stress Concern Present   Feeling of Stress : Not at all  Social Connections: Moderately Isolated   Frequency of Communication with Friends and Family: More than three times a week   Frequency of Social Gatherings with Friends and Family: More than three times a week   Attends Religious Services: 1 to 4 times per year   Active Member of [employer] or Organizations: No   Attends Archivist Meetings: Never   Marital Status: Widowed     Clinical Intake: Nutrition Risk Assessment:  Has the patient had any N/V/D within the last 2 months?  No  Does the patient have any non-healing wounds?  No  Has the patient had any unintentional weight loss or weight gain?  No   Diabetes:  Is the patient diabetic?  Yes  If diabetic, was a CBG obtained today?  No  Audio Visit Did the patient bring in their glucometer from home?  No  How often do you monitor your CBG's? 1x per wk.   Financial Strains and Diabetes Management:  Are you having any financial strains with the device, your supplies or your medication?  No .  Does the patient want to be seen by Chronic Care Management for management of their diabetes?  No  Would the patient like to be referred to a Nutritionist or for Diabetic Management?  No Followed by PCP  Diabetic Exams:  Diabetic Eye Exam: Completed No. Overdue for diabetic eye exam. Pt has been advised about the importance in completing this exam. A  referral has been placed today. Message sent to referral coordinator for scheduling purposes. Advised pt to expect a call from office referred to regarding appt.  Diabetic Foot Exam: Completed No Patient deferred..    Activities of Daily Living In your present state of health, do you have any difficulty performing the following activities: 03/19/2021 03/18/2021  Hearing? - Y  Comment - -  Vision? - N  Difficulty concentrating or making decisions? - Y  Comment - -  Walking or climbing stairs? Y -  Comment - -  Dressing or bathing? - Y  Comment - -  Doing errands, shopping? [place] and eating ? - -  Comment - -  Using the Toilet? - -  Comment - -  In the past six months, have you accidently leaked urine? - -  Comment - -  Do you have problems with loss of bowel control? - -  Comment - -  Managing your Medications? - -  Comment - -  Managing your Finances? - -  Comment - -  Housekeeping or managing your Housekeeping? - -  Comment - -  Some recent data might be hidden    Patient Care Team: [name], NP as PCP - General (Family Medicine) Dorena Cookey, MD (Inactive) Alda Berthold, DO as Consulting Physician (Neurology) Dimitri Ped, RN as Case Manager Viona Gilmore, [facility] as Pharmacist (Pharmacist)  Indicate any recent Medical Services you may have received from other than [facility] providers in the past year (date may be approximate).     Assessment:   This is a routine wellness examination for [place].  Virtual Visit via Telephone Note  I connected with  [name] on  06/14/21 at 11:15 AM EST by telephone and verified that I am speaking with the correct person using two identifiers.  Location: Patient: Home Provider: Office Persons participating in the virtual visit: patient/Nurse Health Advisor   I discussed the limitations, risks, security and privacy concerns of performing an evaluation and management service by telephone and the availability of in person appointments. The patient expressed understanding and agreed to proceed.  Interactive audio and video telecommunications were attempted between this nurse and patient, however failed, due to patient having technical difficulties OR patient did not have access to video capability.  We continued and completed visit with audio only.  Some vital signs may be absent or patient reported.   Criselda Peaches, LPN   Hearing/Vision screen No results found.  Dietary issues and exercise activities discussed:     Goals Addressed   None    Depression Screen PHQ 2/9 Scores 01/27/2021 01/17/2021 02/16/2014 02/16/2014  PHQ - 2 Score 0 0 0 0  Exception Documentation Medical reason Medical reason - -  Not completed daughter -Lc Joynt denies Daughter - Juvencio Verdi denies. - -  Some encounter information is confidential and restricted. Go to Review Flowsheets activity to see all data.    Fall Risk Fall Risk  01/27/2021 01/17/2021 12/09/2020 06/06/2020 10/09/2019  Falls in the past year? 1 1 1 1 1   Number falls in past yr: 1 1 1 1 1   Injury with Fall? 1 1 1 1  0  Risk Factor Category  - - - - -  Risk for fall due to : Impaired balance/gait;Impaired mobility;Mental status change History of fall(s);Impaired balance/gait;Impaired mobility;Mental status change - - Impaired balance/gait;Impaired mobility  Follow up Education provided;Falls prevention discussed Falls evaluation completed;Education provided;Falls  prevention discussed - - -  Some encounter information is confidential and restricted. Go to Review  Flowsheets activity to see all data.    FALL RISK PREVENTION PERTAINING TO THE HOME:  Any stairs in or around the home? Yes  If so, are there any without handrails? No  Home free of loose throw rugs in walkways, pet beds, electrical cords, etc? Yes  Adequate lighting in your home to reduce risk of falls? Yes   ASSISTIVE DEVICES UTILIZED TO PREVENT FALLS:  Life alert? No  Use of a cane, walker or w/c? Yes  Grab bars in the bathroom? Yes  Shower chair or bench in shower? Yes  Elevated toilet seat or a handicapped toilet? Yes   TIMED UP AND GO:  Was the test performed? No . Audio Visit  Cognitive Function:   Montreal Cognitive Assessment  11/05/2014  Visuospatial/ Executive (0/5) 1  Naming (0/3) 3  Attention: Read list of digits (0/2) 2  Attention: Read list of letters (0/1) 1  Attention: Serial 7 subtraction starting at 100 (0/3) 3  Language: Repeat phrase (0/2) 2  Language : Fluency (0/1) 0  Abstraction (0/2) 2  Delayed Recall (0/5) 0  Orientation (0/6) 5  Total 19  Adjusted Score (based on education) 19      Immunizations Immunization History  Administered Date(s) Administered   Fluad Quad(high Dose 65+) 04/27/2019, 04/17/2020   Influenza Split 06/15/2011, 03/03/2012   Influenza Whole 06/18/2005, 04/23/2007, 03/23/2008, 03/03/2010   Influenza, High Dose Seasonal PF 03/24/2013, 04/18/2015, 05/09/2018   Influenza,inj,Quad PF,6+ Mos 02/16/2014   Pneumococcal Conjugate-13 06/20/2016   Pneumococcal Polysaccharide-23 06/18/2004, 02/08/2009, 04/17/2020   Td 06/19/2003   Zoster, Live 04/10/2010      Flu Vaccine status: Due, Education has been provided regarding the importance of this vaccine. Advised may receive this vaccine at local pharmacy or Health Dept. Aware to provide a copy of the vaccination record if obtained from local pharmacy or Health Dept. Verbalized acceptance and understanding.    Covid-19 vaccine status: Information provided on how to obtain  vaccines.   Qualifies for Shingles Vaccine? Yes   Zostavax completed No   Shingrix Completed?: No.    Education has been provided regarding the importance of this vaccine. Patient has been advised to call insurance company to determine out of pocket expense if they have not yet received this vaccine. Advised may also receive vaccine at local pharmacy or Health Dept. Verbalized acceptance and understanding.  Screening Tests Health Maintenance  Topic Date Due   COVID-19 Vaccine (1) Never done   Zoster Vaccines- Shingrix (1 of 2) Never done   OPHTHALMOLOGY EXAM  04/21/2013   TETANUS/TDAP  06/18/2013   URINE MICROALBUMIN  03/26/2015   FOOT EXAM  07/24/2018   INFLUENZA VACCINE  01/16/2021   HEMOGLOBIN A1C  09/16/2021   Pneumonia Vaccine 67+ Years old  Completed   HPV VACCINES  Aged Out    Health Maintenance  Health Maintenance Due  Topic Date Due   COVID-19 Vaccine (1) Never done   Zoster Vaccines- Shingrix (1 of 2) Never done   OPHTHALMOLOGY EXAM  04/21/2013   TETANUS/TDAP  06/18/2013   URINE MICROALBUMIN  03/26/2015   FOOT EXAM  07/24/2018   INFLUENZA VACCINE  01/16/2021        Additional Screening:   Vision Screening: Recommended annual ophthalmology exams for early detection of glaucoma and other disorders of the eye. Is the patient up to date with their annual eye exam?  No  Who  is the provider or what is the name of the office in which the patient attends annual eye exams? Patient deferred.  If pt is not established with a provider, would they like to be referred to a provider to establish care? No  Patient deferred.   Dental Screening: Recommended annual dental exams for proper oral hygiene  Community Resource Referral / Chronic Care Management:  CRR required this visit?  No   CCM required this visit?  No      Plan:     I have personally reviewed and noted the following in the patients chart:   Medical and social history Use of alcohol, tobacco or  illicit drugs  Current medications and supplements including opioid prescriptions. Patient is not currently taking opioid prescriptions. Functional ability and status Nutritional status Physical activity Advanced directives List of other physicians Hospitalizations, surgeries, and ER visits in previous 12 months Vitals Screenings to include cognitive, depression, and falls Referrals and appointments  In addition, I have reviewed and discussed with patient certain preventive protocols, quality metrics, and best practice recommendations. A written personalized care plan for preventive services as well as general preventive health recommendations were provided to patient.     Criselda Peaches, LPN   [id number]

## 2021-06-14 NOTE — Telephone Encounter (Signed)
Home health therapist Mitzi Hansen from [facility] called and stated he will need orders to continue a re certification this week and two more visit for 2 weeks. He can be reached [phone]

## 2021-06-14 NOTE — Telephone Encounter (Signed)
I left the verbal on Andrew's voicemail.

## 2021-06-16 ENCOUNTER — Telehealth: Payer: Self-pay | Admitting: Adult Health

## 2021-06-16 NOTE — Telephone Encounter (Signed)
Noted. FYI 

## 2021-06-16 NOTE — Telephone Encounter (Signed)
Mitzi Hansen PT with [facility] is calling to let provider know they are moving pt physical therapy back one week  1x3

## 2021-06-17 DIAGNOSIS — G20 Parkinson's disease: Secondary | ICD-10-CM

## 2021-06-17 DIAGNOSIS — F0280 Dementia in other diseases classified elsewhere without behavioral disturbance: Secondary | ICD-10-CM | POA: Diagnosis not present

## 2021-06-17 DIAGNOSIS — E783 Hyperchylomicronemia: Secondary | ICD-10-CM

## 2021-06-17 DIAGNOSIS — E119 Type 2 diabetes mellitus without complications: Secondary | ICD-10-CM

## 2021-06-20 DIAGNOSIS — Z7984 Long term (current) use of oral hypoglycemic drugs: Secondary | ICD-10-CM | POA: Diagnosis not present

## 2021-06-20 DIAGNOSIS — E119 Type 2 diabetes mellitus without complications: Secondary | ICD-10-CM | POA: Diagnosis not present

## 2021-06-20 DIAGNOSIS — I482 Chronic atrial fibrillation, unspecified: Secondary | ICD-10-CM | POA: Diagnosis not present

## 2021-06-20 DIAGNOSIS — E039 Hypothyroidism, unspecified: Secondary | ICD-10-CM | POA: Diagnosis not present

## 2021-06-20 DIAGNOSIS — G20 Parkinson's disease: Secondary | ICD-10-CM | POA: Diagnosis not present

## 2021-06-20 DIAGNOSIS — I10 Essential (primary) hypertension: Secondary | ICD-10-CM | POA: Diagnosis not present

## 2021-06-20 DIAGNOSIS — K219 Gastro-esophageal reflux disease without esophagitis: Secondary | ICD-10-CM | POA: Diagnosis not present

## 2021-06-20 DIAGNOSIS — E785 Hyperlipidemia, unspecified: Secondary | ICD-10-CM | POA: Diagnosis not present

## 2021-06-26 ENCOUNTER — Other Ambulatory Visit: Payer: Self-pay | Admitting: Neurology

## 2021-06-26 DIAGNOSIS — R569 Unspecified convulsions: Secondary | ICD-10-CM

## 2021-06-30 DIAGNOSIS — G20 Parkinson's disease: Secondary | ICD-10-CM | POA: Diagnosis not present

## 2021-06-30 DIAGNOSIS — I10 Essential (primary) hypertension: Secondary | ICD-10-CM | POA: Diagnosis not present

## 2021-06-30 DIAGNOSIS — E785 Hyperlipidemia, unspecified: Secondary | ICD-10-CM | POA: Diagnosis not present

## 2021-06-30 DIAGNOSIS — E039 Hypothyroidism, unspecified: Secondary | ICD-10-CM | POA: Diagnosis not present

## 2021-06-30 DIAGNOSIS — K219 Gastro-esophageal reflux disease without esophagitis: Secondary | ICD-10-CM | POA: Diagnosis not present

## 2021-06-30 DIAGNOSIS — I482 Chronic atrial fibrillation, unspecified: Secondary | ICD-10-CM | POA: Diagnosis not present

## 2021-06-30 DIAGNOSIS — E119 Type 2 diabetes mellitus without complications: Secondary | ICD-10-CM | POA: Diagnosis not present

## 2021-06-30 DIAGNOSIS — Z7984 Long term (current) use of oral hypoglycemic drugs: Secondary | ICD-10-CM | POA: Diagnosis not present

## 2021-07-03 ENCOUNTER — Other Ambulatory Visit: Payer: Self-pay | Admitting: Adult Health

## 2021-07-04 ENCOUNTER — Other Ambulatory Visit: Payer: Self-pay | Admitting: Neurology

## 2021-07-04 DIAGNOSIS — G20 Parkinson's disease: Secondary | ICD-10-CM | POA: Diagnosis not present

## 2021-07-04 DIAGNOSIS — I10 Essential (primary) hypertension: Secondary | ICD-10-CM | POA: Diagnosis not present

## 2021-07-04 DIAGNOSIS — E039 Hypothyroidism, unspecified: Secondary | ICD-10-CM | POA: Diagnosis not present

## 2021-07-04 DIAGNOSIS — I482 Chronic atrial fibrillation, unspecified: Secondary | ICD-10-CM | POA: Diagnosis not present

## 2021-07-04 DIAGNOSIS — Z7984 Long term (current) use of oral hypoglycemic drugs: Secondary | ICD-10-CM | POA: Diagnosis not present

## 2021-07-04 DIAGNOSIS — K219 Gastro-esophageal reflux disease without esophagitis: Secondary | ICD-10-CM | POA: Diagnosis not present

## 2021-07-04 DIAGNOSIS — E785 Hyperlipidemia, unspecified: Secondary | ICD-10-CM | POA: Diagnosis not present

## 2021-07-04 DIAGNOSIS — E119 Type 2 diabetes mellitus without complications: Secondary | ICD-10-CM | POA: Diagnosis not present

## 2021-07-13 ENCOUNTER — Ambulatory Visit (INDEPENDENT_AMBULATORY_CARE_PROVIDER_SITE_OTHER): Payer: Medicare Other

## 2021-07-13 DIAGNOSIS — G20 Parkinson's disease: Secondary | ICD-10-CM

## 2021-07-13 DIAGNOSIS — E783 Hyperchylomicronemia: Secondary | ICD-10-CM

## 2021-07-13 DIAGNOSIS — G40909 Epilepsy, unspecified, not intractable, without status epilepticus: Secondary | ICD-10-CM

## 2021-07-13 DIAGNOSIS — R296 Repeated falls: Secondary | ICD-10-CM

## 2021-07-13 DIAGNOSIS — E119 Type 2 diabetes mellitus without complications: Secondary | ICD-10-CM

## 2021-07-13 NOTE — Patient Instructions (Addendum)
Visit Information  Thank you for taking time to visit with me today. Please don't hesitate to contact me if I can be of assistance to you before our next scheduled telephone appointment.  Following are the goals we discussed today:  Take all medications as prescribed Attend all scheduled provider appointments Call provider office for new concerns or questions  check blood sugar at prescribed times: when you have symptoms of low or high blood sugar and 1-2 times a week check feet daily for cuts, sores or redness take the blood sugar log to all doctor visits drink 6 to 8 glasses of water each day wash and dry feet carefully every day call for medicine refill 2 or 3 days before it runs out take all medications exactly as prescribed call doctor with any symptoms you believe are related to your medicine  always use handrails on the stairs - install bathroom grab bars - keep a flashlight by the bed - keep cell phone with me always - make an emergency alert plan in case I fall - pick up clutter from the floors - remove throw rugs or use nonslip pads - use a cane or walker - use nightlight in the bathroom, halls - always wear low-heeled or flat shoes or slippers with nonskid soles Our next appointment is by telephone on 09/21/21 at 10 AM  Please call the care guide team at [phone] if you need to cancel or reschedule your appointment.   If you are experiencing a Mental Health or [place] or need someone to talk to, please call the Suicide and Crisis Lifeline: 988 call the [place] National Suicide Prevention Lifeline: 5861145340 or TTY: 570-193-5924 TTY 413-326-1405) to talk to a trained counselor call 1-800-273-TALK (toll free, 24 hour hotline) go to [facility] [address], [place] [phone]) call 911   The patient verbalized understanding of instructions, educational materials, and care plan provided today and agreed to  receive a mailed copy of patient instructions, educational materials, and care plan.  Peter Garter RN, Jackquline Denmark, CDE Care Management Coordinator [facility] [phone]

## 2021-07-13 NOTE — Chronic Care Management (AMB) (Signed)
Chronic Care Management   CCM RN Visit Note  07/13/2021 Name: Gerald Leach MRN: [id number] DOB: [birth year]  Subjective: Gerald Leach is a 88 y.o. year old male who is a primary care patient of [name], NP. The care management team was consulted for assistance with disease management and care coordination needs.    Engaged with patient by telephone for follow up visit in response to provider referral for case management and/or care coordination services.   Consent to Services:  The patient was given information about Chronic Care Management services, agreed to services, and gave verbal consent prior to initiation of services.  Please see initial visit note for detailed documentation.   Patient agreed to services and verbal consent obtained.   Assessment: Review of patient past medical history, allergies, medications, health status, including review of consultants reports, laboratory and other test data, was performed as part of comprehensive evaluation and provision of chronic care management services.   SDOH (Social Determinants of Health) assessments and interventions performed:    CCM Care Plan  No Known Allergies  Outpatient Encounter Medications as of 07/13/2021  Medication Sig   acetaminophen (TYLENOL) 325 MG tablet Take 2 tablets (650 mg total) by mouth every 6 (six) hours as needed for mild pain (or Fever >/= 101).   amantadine (SYMMETREL) 100 MG capsule Take 1 capsule (100 mg total) by mouth daily.   carbidopa-levodopa (SINEMET IR) 25-100 MG tablet TAKE 1 TABLET BY MOUTH THREE TIMES A DAY   Lacosamide 150 MG TABS Take 1 tablet (150 mg total) by mouth 2 (two) times daily.   levETIRAcetam (KEPPRA) 750 MG tablet TAKE 2 TABLETS (1,500 MG TOTAL) BY MOUTH 2 (TWO) TIMES DAILY.   levothyroxine (SYNTHROID) 75 MCG tablet TAKE 1 TABLET BY MOUTH EVERY DAY   metFORMIN (GLUCOPHAGE) 500 MG tablet TAKE 1 TABLET (500 MG TOTAL) BY MOUTH 2 (TWO) TIMES DAILY WITH A MEAL. NEED APPOINTMENT    omeprazole (PRILOSEC) 20 MG capsule TAKE 1 CAPSULE BY MOUTH EVERY DAY   ondansetron (ZOFRAN) 4 MG tablet Take 1 tablet (4 mg total) by mouth every 6 (six) hours as needed for nausea.   QUEtiapine (SEROQUEL) 25 MG tablet Take 0.5 tablets (12.5 mg total) by mouth at bedtime.   vitamin B-12 (CYANOCOBALAMIN) 1000 MCG tablet Take 1,000 mcg by mouth daily.   No facility-administered encounter medications on file as of 07/13/2021.    Patient Active Problem List   Diagnosis Date Noted   Breakthrough seizure ([place]) 03/19/2021   Seizure ([place]) 03/18/2021   Dementia due to Parkinson'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 behavioral disturbance ([place]) 08/16/2020   Fall 08/11/2020   Closed compression fracture of L5 lumbar vertebra, initial encounter ([place]) 08/11/2020   Seizures ([place]) 03/05/2018   Leukocytosis 03/05/2018   Diabetic foot ulcers ([place]) 04/23/2017   Parkinson disease ([place])    Gait instability    Diabetes mellitus type 2, controlled ([place]) 08/05/2015   Hypothyroidism 08/05/2015   Fall at home 10/02/2014   CNS aneurysm s/p coiling ([facility], 2002) 10/02/2014   High grade T7 central canal stenosis with T7 vertebral fracture s/p fall (02/2012) 10/02/2014   Right clinoid calcified meningioma vs thrombosed giant ICA aneurysm, conservative management. 10/02/2014   Syncope 02/05/2014   Drug-induced delirium(292.81) 05/16/2013   Seizure disorder ([place]) 04/21/2013   Seizure disorder, grand mal ([place]) 03/24/2013   Cervical compression fracture---C7 10/29/2012   Dysphagia 09/21/2012   Subdural hematoma 09/20/2012   SOLAR KERATOSIS 02/17/2009   CARCINOMA, SKIN, SQUAMOUS CELL, FACE 04/19/2008  Hyperlipidemia, group D 01/27/2008   COLONIC POLYPS 11/15/2006   GERD 11/15/2006    Conditions to be addressed/monitored:HLD, DMII, and Seizures, Parkinson disease  Care Plan : RN Care Manager Plan of Care  Updates made by Dimitri Ped, RN since 07/13/2021 12:00 AM     Problem: Chronic Disease Management and Care  Coordination Needs (Seizures, Parkinson disease, DM. HLD)   Priority: High     Long-Range Goal: Establish Plan of Care for Chronic Disease Management Needs (Seizures, Parkinson disease, DM. HLD)   Start Date: 05/18/2021  Expected End Date: 11/14/2021  Priority: High  Note:   Current Barriers:  Care Coordination needs related to ADL IADL limitations, Memory Deficits, Inability to perform ADL's independently, and Inability to perform IADL's independently Chronic Disease Management support and education needs related to HLD, DMII, and Parkinson disease, seizures,dementia  Cognitive Deficits Spoke with son [name].     States pt is using a walker.  States he has a paid caregiver from 1-5 who helps with cooking, cleaning and reminds pt to take his medications.   Son and daughter share duties caring for pt. States that pts wife died Jul 06, 2021. States pt has been handling his wife's passing as well as can be expected as she had dementia and had been in memory care.  States pt has not had any seizures or falls since last call.  States pt his eating good and his mood is unchanged. States he and his sister will are checking on pt  frequently.  States pt is good at remembering to take his medications in the morning and his caregiver makes sure he takes him medications in the evening. States he checks his CBG about once a week and last reading was 110.  States pt still has home health PT and is to have one more visit  RNCM Clinical Goal(s):  Patient will verbalize basic understanding of  HLD, DMII, and Parkinson disease, seizures  disease process and self health management plan as evidenced by voiced adherence to plan of care take all medications exactly as prescribed and will call provider for medication related questions as evidenced by dispense report and pt verbalization attend all scheduled medical appointments: Cardiology 08/02/21, neurology 08/30/21 as evidenced by review of medical records demonstrate  Improved adherence to prescribed treatment plan for HLD, DMII, and Parkinson disease, seizures, dementia  as evidenced by readings within limits, voiced adherence to plan of care continue to work with RN Care Manager to address care management and care coordination needs related to  HLD, DMII, and Parkinson disease, seizures, dementia  as evidenced by adherence to CM Team Scheduled appointments through collaboration with RN Care manager, provider, and care team.   Interventions: 1:1 collaboration with primary care provider regarding development and update of comprehensive plan of care as evidenced by provider attestation and co-signature Inter-disciplinary care team collaboration (see longitudinal plan of care) Evaluation of current treatment plan related to  self management and patient's adherence to plan as established by provider   Diabetes Interventions:  (Status:  Goal on track:  Yes.) Long Term Goal Assessed patient's understanding of A1c goal: <7% Provided education to patient about basic DM disease process Reviewed medications with patient and discussed importance of medication adherence Counseled on importance of regular laboratory monitoring as prescribed Discussed plans with patient for ongoing care management follow up and provided patient with direct contact information for care management team Advised patient, providing education and rationale, to check cbg 1-2 times a week and  record, calling provider for findings outside established parameters Review of patient status, including review of consultants reports, relevant laboratory and other test results, and medications completed Reviewed to have pt eat a well balanced lower CHO diet Lab Results  Component Value Date   HGBA1C 5.4 03/18/2021   Falls Interventions:  (Status:  Goal on track:  Yes.) Long Term Goal Reviewed medications and discussed potential side effects of medications such as dizziness and frequent  urination Advised patient of importance of notifying provider of falls Assessed for falls since last encounter Assessed patients knowledge of fall risk prevention secondary to previously provided education Reviewed to have pt continue exercises that PT has taught pt   Seizures  (Status:  Goal on track:  Yes.)  Long Term Goal Evaluation of current treatment plan related to  Seizures , ADL IADL limitations and Memory Deficits self-management and patient's adherence to plan as established by provider. Discussed plans with patient for ongoing care management follow up and provided patient with direct contact information for care management team Evaluation of current treatment plan related to seizures and patient's adherence to plan as established by provider Advised patient to call to schedule follow up with neurologist Discussed plans with patient for ongoing care management follow up and provided patient with direct contact information for care management team Reviewed importance of taking seizure medications as ordered.  Hyperlipidemia Interventions:  (Status:  Condition stable.  Not addressed this visit.) Long Term Goal Medication review performed; medication list updated in electronic medical record.  Provider established cholesterol goals reviewed Counseled on importance of regular laboratory monitoring as prescribed Reviewed importance of limiting foods high in cholesterol  Patient Goals/Self-Care Activities: Take all medications as prescribed Attend all scheduled provider appointments Call provider office for new concerns or questions  check blood sugar at prescribed times: when you have symptoms of low or high blood sugar and 1-2 times a week check feet daily for cuts, sores or redness take the blood sugar log to all doctor visits drink 6 to 8 glasses of water each day wash and dry feet carefully every day call for medicine refill 2 or 3 days before it runs out take all  medications exactly as prescribed call doctor with any symptoms you believe are related to your medicine - always use handrails on the stairs - install bathroom grab bars - keep a flashlight by the bed - keep cell phone with me always - make an emergency alert plan in case I fall - pick up clutter from the floors - remove throw rugs or use nonslip pads - use a cane or walker - use nightlight in the bathroom, halls - always wear low-heeled or flat shoes or slippers with nonskid soles  Follow Up Plan:  Telephone follow up appointment with care management team member scheduled for:  09/21/21 The patient has been provided with contact information for the care management team and has been advised to call with any health related questions or concerns.       Plan:Telephone follow up appointment with care management team member scheduled for:  09/21/21 The patient has been provided with contact information for the care management team and has been advised to call with any health related questions or concerns.  Peter Garter RN, Jackquline Denmark, CDE Care Management Coordinator [facility] [phone]

## 2021-07-18 ENCOUNTER — Other Ambulatory Visit: Payer: Self-pay | Admitting: Neurology

## 2021-07-18 DIAGNOSIS — E119 Type 2 diabetes mellitus without complications: Secondary | ICD-10-CM | POA: Diagnosis not present

## 2021-07-18 DIAGNOSIS — E783 Hyperchylomicronemia: Secondary | ICD-10-CM

## 2021-07-25 NOTE — Progress Notes (Unsigned)
Cardiology Office Note    Date:  07/25/2021   ID:  Gerald Leach, DOB [birth year], MRN [id number]   PCP:  Gerald Peng, NP   [place]  Cardiologist:  None *** Advanced Practice Provider:  No care team member to display Electrophysiologist:  None   [id number]}   No chief complaint on file.   History of Present Illness:  Gerald Leach is a 88 y.o. male with a PMH of DM II, chronic atrial fibrillation, hypothyroidism and seizures, parkinson's disease, dementia.  Was seen in the hospital 03/2021 for Afib which appeared to be chronic. No anticoag b/c of fall risk, not on rate control meds b/c of pauses up to 3 sec on AV nodal blocking agents. Echo normal LVEF 65-70% RV mod reduced.    Past Medical History:  Diagnosis Date   Brain aneurysm    Colonic polyp 2003   Dementia [facility])    Diabetes mellitus without complication [facility])    ED (erectile dysfunction)    GERD (gastroesophageal reflux disease)    Hyperlipidemia    Hypertension    Hypothyroidism    Seizures ([place])    per family   Seizures [facility])     Past Surgical History:  Procedure Laterality Date   Aneurysmal clipping     brain aneurysm surgery     at [facility] 2002   carotid artery aneurysm     COLONOSCOPY     polyps   CRANIOTOMY Right 09/19/2012   Procedure: CRANIOTOMY HEMATOMA EVACUATION SUBDURAL;  Surgeon: Otilio Connors, MD;  Location: [place] NEURO ORS;  Service: Neurosurgery;  Laterality: Right;   CRANIOTOMY Right 09/22/2012   Procedure: CRANIOTOMY HEMATOMA EVACUATION SUBDURAL;  Surgeon: Otilio Connors, MD;  Location: [place] NEURO ORS;  Service: Neurosurgery;  Laterality: Right;   Craniotomy.     right hernia      Current Medications: No outpatient medications have been marked as taking for the 08/02/21 encounter (Appointment) with Imogene Burn, PA-C.     Allergies:   Patient has no known allergies.   Social History   Socioeconomic History   Marital status: Widowed    Spouse name: Not  on file   Number of children: 4   Years of education: 28   Highest education level: 12th grade  Occupational History   Occupation: retired  Tobacco Use   Smoking status: Never    Passive exposure: Past   Smokeless tobacco: Never  Vaping Use   Vaping Use: Never used  Substance and Sexual Activity   Alcohol use: Yes    Alcohol/week: 0.0 standard drinks    Comment: rum mixed in diet coke 3 a week   Drug use: Yes    Types: Nitrous oxide   Sexual activity: Not Currently  Other Topics Concern   Not on file  Social History Narrative   ** Merged History Encounter **       Lives with wife in a 2 story home.  Has no trouble with the stairs as long as he holds on to the banister.  Education: college.  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     Social Determinants of Health   Financial Resource Strain: Low Risk    Difficulty of Paying Living Expenses: Not hard at all  Food Insecurity: No Food Insecurity   Worried About Charity fundraiser in the Last Year: Never true   Ran Out of Food in the Last Year: Never true  Transportation Needs: No Data processing manager (Medical): No   Lack of Transportation (Non-Medical): No  Physical Activity: Insufficiently Active   Days of Exercise per Week: 5 days   Minutes of Exercise per Session: 20 min  Stress: No Stress Concern Present   Feeling of Stress : Not at all  Social Connections: Moderately Isolated   Frequency of Communication with Friends and Family: More than three times a week   Frequency of Social Gatherings with Friends and Family: More than three times a week   Attends Religious Services: Never   Marine scientist or Organizations: No   Attends Archivist Meetings: Never   Marital Status: Married     Family History:  [name] ***family history includes Liver disease in his brother.   ROS:   Please see the history of present illness.    ROS All other systems  reviewed and are negative.   PHYSICAL EXAM:   VS:  There were no vitals taken for this visit.  Physical Exam  GEN: Well nourished, well developed, in no acute distress  HEENT: normal  Neck: no JVD, carotid bruits, or masses Cardiac:RRR; no murmurs, rubs, or gallops  Respiratory:  clear to auscultation bilaterally, normal work of breathing GI: soft, nontender, nondistended, + BS Ext: without cyanosis, clubbing, or edema, Good distal pulses bilaterally MS: no deformity or atrophy  Skin: warm and dry, no rash Neuro:  Alert and Oriented x 3, Strength and sensation are intact Psych: euthymic mood, full affect  Wt Readings from Last 3 Encounters:  03/18/21 151 lb (68.5 kg)  12/09/20 165 lb (74.8 kg)  08/11/20 165 lb (74.8 kg)      Studies/Labs Reviewed:   EKG:  EKG is*** ordered today.  The ekg ordered today demonstrates ***  Recent Labs: 03/22/2021: TSH 5.180 03/24/2021: ALT <5; BUN 23; Creatinine, Ser 0.71; Hemoglobin 15.4; Magnesium 2.0; Platelets 242; Potassium 4.7; Sodium 133   Lipid Panel    Component Value Date/Time   CHOL 177 01/02/2019 1126   TRIG 169.0 (H) 01/02/2019 1126   HDL 37.40 (L) 01/02/2019 1126   CHOLHDL 5 01/02/2019 1126   VLDL 33.8 01/02/2019 1126   LDLCALC 106 (H) 01/02/2019 1126    Additional studies/ records that were reviewed today include:  Echo 03/23/21 IMPRESSIONS     1. Left ventricular ejection fraction, by estimation, is 65 to 70%. The  left ventricle has normal function. The left ventricle has no regional  wall motion abnormalities. Left ventricular diastolic function could not  be evaluated.   2. Right ventricular systolic function is moderately reduced. The right  ventricular size is mildly enlarged.   3. Right atrial size was mildly dilated.   4. The mitral valve is grossly normal. Trivial mitral valve  regurgitation.   5. The aortic valve is grossly normal. Aortic valve regurgitation is  mild.   6. Aortic dilatation noted. There  is mild dilatation of the ascending  aorta, measuring 40 mm.    Risk Assessment/Calculations:   {Does this patient have ATRIAL FIBRILLATION?:[phone]}     ASSESSMENT:    No diagnosis found.   PLAN:  In order of problems listed above:  Permanent Afib no anticoag b/c of fall risk and no AV nodal agents with 3 sec pause in past on this  DM2  Seizures  Parkinson's  Dementia  Shared Decision Making/Informed Consent   {Are you ordering a CV Procedure (e.g. stress test, cath, DCCV, TEE, etc)?  Press F2        :[id number]}    Medication Adjustments/Labs and Tests Ordered: Current medicines are reviewed at length with the patient today.  Concerns regarding medicines are outlined above.  Medication changes, Labs and Tests ordered today are listed in the Patient Instructions below. There are no Patient Instructions on file for this visit.   Signed, Ermalinda Barrios, PA-C  07/25/2021 3:31 PM    [facility] [address], [place], [place]  [address] Phone: [phone]; Fax: [phone]

## 2021-07-26 DIAGNOSIS — M2041 Other hammer toe(s) (acquired), right foot: Secondary | ICD-10-CM | POA: Diagnosis not present

## 2021-07-26 DIAGNOSIS — L97512 Non-pressure chronic ulcer of other part of right foot with fat layer exposed: Secondary | ICD-10-CM | POA: Diagnosis not present

## 2021-07-26 DIAGNOSIS — M2042 Other hammer toe(s) (acquired), left foot: Secondary | ICD-10-CM | POA: Diagnosis not present

## 2021-08-02 ENCOUNTER — Ambulatory Visit: Payer: Medicare Other | Admitting: Physician Assistant

## 2021-08-02 DIAGNOSIS — I4821 Permanent atrial fibrillation: Secondary | ICD-10-CM

## 2021-08-02 DIAGNOSIS — M2042 Other hammer toe(s) (acquired), left foot: Secondary | ICD-10-CM | POA: Diagnosis not present

## 2021-08-02 DIAGNOSIS — M2041 Other hammer toe(s) (acquired), right foot: Secondary | ICD-10-CM | POA: Diagnosis not present

## 2021-08-02 DIAGNOSIS — L97512 Non-pressure chronic ulcer of other part of right foot with fat layer exposed: Secondary | ICD-10-CM | POA: Diagnosis not present

## 2021-08-14 ENCOUNTER — Telehealth: Payer: Self-pay | Admitting: Pharmacist

## 2021-08-14 NOTE — Chronic Care Management (AMB) (Signed)
° ° °  Chronic Care Management Pharmacy Assistant   Name: RAYNE LOISEAU  MRN: [id number] DOB: 12/30/1933  Reason for Encounter: Offer to Schedule Follow up with [name]   Recent office visits:  07/13/21 Dimitri Ped RN - Patient presented for Nurse CCM visit  05/25/21 Dimitri Ped RN - Patient presented for Nurse CCM visit  04/07/21 05/25/21 Dimitri Ped RN - Patient presented for Nurse CCM visit  06/14/21 Criselda Peaches, LPN - Patient presented for [facility] Annual Wellness exam. No medication changes.  03/03/21  Dimitri Ped RN - Patient presented for Nurse CCM visit  02/24/21  Dimitri Ped RN - Patient presented for Nurse CCM visit  02/16/21 Nat Christen D - Patient presented for CCM Social Work visit  Recent consult visits:  04/06/21 Maryella Shivers (Family Med) - Patient presented for other malaise and other concerns. No other visit details available.  03/27/21 Maryella Shivers (Family Med) - Patient presented for other malaise and other concerns. No other visit details available.  02/24/21 Ina Homes - Patient presented for Type 2 diabetes and other concerns. No other visit details available.  Hospital visits:  Medication Reconciliation was completed by comparing discharge summary, patients EMR and Pharmacy list, and upon discussion with patient.  Patient presented to [facility] on 03/17/21 due to Seizure. Patient was present for 7 days.  New?Medications Started at [facility] Discharge:?? -started  acetaminophen (TYLENOL) ondansetron (ZOFRAN) QUEtiapine (SEROQUEL)  Medication Changes at Hospital Discharge: -Changed  Lacosamide  Medications Discontinued at Hospital Discharge: -Stopped  none  Medications that remain the same after Hospital Discharge:??  -All other medications will remain the same.    Medications: Outpatient Encounter Medications as of 08/14/2021  Medication Sig   acetaminophen (TYLENOL)  325 MG tablet Take 2 tablets (650 mg total) by mouth every 6 (six) hours as needed for mild pain (or Fever >/= 101).   amantadine (SYMMETREL) 100 MG capsule Take 1 capsule (100 mg total) by mouth daily.   carbidopa-levodopa (SINEMET IR) 25-100 MG tablet TAKE 1 TABLET BY MOUTH THREE TIMES A DAY   Lacosamide 150 MG TABS Take 1 tablet (150 mg total) by mouth 2 (two) times daily.   levETIRAcetam (KEPPRA) 750 MG tablet TAKE 2 TABLETS (1,500 MG TOTAL) BY MOUTH 2 (TWO) TIMES DAILY.   levothyroxine (SYNTHROID) 75 MCG tablet TAKE 1 TABLET BY MOUTH EVERY DAY   metFORMIN (GLUCOPHAGE) 500 MG tablet TAKE 1 TABLET (500 MG TOTAL) BY MOUTH 2 (TWO) TIMES DAILY WITH A MEAL. NEED APPOINTMENT   omeprazole (PRILOSEC) 20 MG capsule TAKE 1 CAPSULE BY MOUTH EVERY DAY   ondansetron (ZOFRAN) 4 MG tablet Take 1 tablet (4 mg total) by mouth every 6 (six) hours as needed for nausea.   QUEtiapine (SEROQUEL) 25 MG tablet Take 0.5 tablets (12.5 mg total) by mouth at bedtime.   vitamin B-12 (CYANOCOBALAMIN) 1000 MCG tablet Take 1,000 mcg by mouth daily.   No facility-administered encounter medications on file as of 08/14/2021.  Notes:  Reached out to patient to offer a follow up with [name]. Did not reach left voice mail with my contact information for return call.  Care Gaps: COVID Vaccines - Overdue Eye Exam - Overdue Urine Micro - Overdue Foot Exam - Overdue  Star Rating Drugs: Metformin (Glucophage) 500 mg - Last filled 08/02/21 90 DS at [place] Pharmacist Assistant [phone]

## 2021-08-16 DIAGNOSIS — M792 Neuralgia and neuritis, unspecified: Secondary | ICD-10-CM | POA: Diagnosis not present

## 2021-08-16 DIAGNOSIS — M2042 Other hammer toe(s) (acquired), left foot: Secondary | ICD-10-CM | POA: Diagnosis not present

## 2021-08-27 ENCOUNTER — Other Ambulatory Visit: Payer: Self-pay | Admitting: Adult Health

## 2021-08-28 ENCOUNTER — Telehealth (INDEPENDENT_AMBULATORY_CARE_PROVIDER_SITE_OTHER): Payer: Medicare Other | Admitting: Neurology

## 2021-08-28 ENCOUNTER — Other Ambulatory Visit: Payer: Self-pay

## 2021-08-28 ENCOUNTER — Encounter: Payer: Self-pay | Admitting: Neurology

## 2021-08-28 VITALS — Resp 18 | Ht 72.0 in | Wt 145.0 lb

## 2021-08-28 DIAGNOSIS — R569 Unspecified convulsions: Secondary | ICD-10-CM

## 2021-08-28 NOTE — Progress Notes (Signed)
Patient switched to virtual visit and we were able to connect to daughter (LeighAnn''s phone), however, patient was not available for the visit.  Daughter was advised that patient needs to be available to proceed with visit.  She will reschedule. ?

## 2021-09-03 ENCOUNTER — Emergency Department (HOSPITAL_COMMUNITY): Payer: Medicare Other

## 2021-09-03 ENCOUNTER — Inpatient Hospital Stay (HOSPITAL_COMMUNITY)
Admission: EM | Admit: 2021-09-03 | Discharge: 2021-09-14 | DRG: 280 | Disposition: A | Discharge status: Skilled Nursing Facility | Payer: Medicare Other | Attending: Internal Medicine | Admitting: Internal Medicine

## 2021-09-03 DIAGNOSIS — R2689 Other abnormalities of gait and mobility: Secondary | ICD-10-CM | POA: Diagnosis not present

## 2021-09-03 DIAGNOSIS — Z7189 Other specified counseling: Secondary | ICD-10-CM

## 2021-09-03 DIAGNOSIS — R000 Tachycardia, unspecified: Secondary | ICD-10-CM | POA: Diagnosis not present

## 2021-09-03 DIAGNOSIS — F0284 Dementia in other diseases classified elsewhere, unspecified severity, with anxiety: Secondary | ICD-10-CM | POA: Diagnosis not present

## 2021-09-03 DIAGNOSIS — G20 Parkinson's disease: Secondary | ICD-10-CM | POA: Diagnosis present

## 2021-09-03 DIAGNOSIS — E876 Hypokalemia: Secondary | ICD-10-CM | POA: Diagnosis present

## 2021-09-03 DIAGNOSIS — I4891 Unspecified atrial fibrillation: Secondary | ICD-10-CM | POA: Diagnosis not present

## 2021-09-03 DIAGNOSIS — K219 Gastro-esophageal reflux disease without esophagitis: Secondary | ICD-10-CM | POA: Diagnosis present

## 2021-09-03 DIAGNOSIS — I2109 ST elevation (STEMI) myocardial infarction involving other coronary artery of anterior wall: Principal | ICD-10-CM | POA: Diagnosis present

## 2021-09-03 DIAGNOSIS — E039 Hypothyroidism, unspecified: Secondary | ICD-10-CM | POA: Diagnosis not present

## 2021-09-03 DIAGNOSIS — Z66 Do not resuscitate: Secondary | ICD-10-CM | POA: Diagnosis present

## 2021-09-03 DIAGNOSIS — Z86010 Personal history of colonic polyps: Secondary | ICD-10-CM | POA: Diagnosis not present

## 2021-09-03 DIAGNOSIS — M6281 Muscle weakness (generalized): Secondary | ICD-10-CM | POA: Diagnosis not present

## 2021-09-03 DIAGNOSIS — R627 Adult failure to thrive: Secondary | ICD-10-CM | POA: Diagnosis present

## 2021-09-03 DIAGNOSIS — Z79899 Other long term (current) drug therapy: Secondary | ICD-10-CM

## 2021-09-03 DIAGNOSIS — Z7401 Bed confinement status: Secondary | ICD-10-CM | POA: Diagnosis not present

## 2021-09-03 DIAGNOSIS — I213 ST elevation (STEMI) myocardial infarction of unspecified site: Principal | ICD-10-CM

## 2021-09-03 DIAGNOSIS — U071 COVID-19: Secondary | ICD-10-CM | POA: Diagnosis not present

## 2021-09-03 DIAGNOSIS — E119 Type 2 diabetes mellitus without complications: Secondary | ICD-10-CM | POA: Diagnosis present

## 2021-09-03 DIAGNOSIS — Z79890 Hormone replacement therapy (postmenopausal): Secondary | ICD-10-CM

## 2021-09-03 DIAGNOSIS — R079 Chest pain, unspecified: Secondary | ICD-10-CM | POA: Diagnosis not present

## 2021-09-03 DIAGNOSIS — Z515 Encounter for palliative care: Secondary | ICD-10-CM | POA: Diagnosis not present

## 2021-09-03 DIAGNOSIS — R0689 Other abnormalities of breathing: Secondary | ICD-10-CM | POA: Diagnosis not present

## 2021-09-03 DIAGNOSIS — I480 Paroxysmal atrial fibrillation: Secondary | ICD-10-CM | POA: Diagnosis present

## 2021-09-03 DIAGNOSIS — Z6820 Body mass index (BMI) 20.0-20.9, adult: Secondary | ICD-10-CM | POA: Diagnosis not present

## 2021-09-03 DIAGNOSIS — R54 Age-related physical debility: Secondary | ICD-10-CM | POA: Diagnosis present

## 2021-09-03 DIAGNOSIS — G40909 Epilepsy, unspecified, not intractable, without status epilepticus: Secondary | ICD-10-CM | POA: Diagnosis present

## 2021-09-03 DIAGNOSIS — R404 Transient alteration of awareness: Secondary | ICD-10-CM | POA: Diagnosis not present

## 2021-09-03 DIAGNOSIS — R4020 Unspecified coma: Secondary | ICD-10-CM | POA: Diagnosis not present

## 2021-09-03 DIAGNOSIS — R278 Other lack of coordination: Secondary | ICD-10-CM | POA: Diagnosis not present

## 2021-09-03 DIAGNOSIS — I472 Ventricular tachycardia, unspecified: Secondary | ICD-10-CM | POA: Diagnosis present

## 2021-09-03 DIAGNOSIS — I517 Cardiomegaly: Secondary | ICD-10-CM | POA: Diagnosis not present

## 2021-09-03 DIAGNOSIS — E785 Hyperlipidemia, unspecified: Secondary | ICD-10-CM | POA: Diagnosis present

## 2021-09-03 DIAGNOSIS — I499 Cardiac arrhythmia, unspecified: Secondary | ICD-10-CM | POA: Diagnosis not present

## 2021-09-03 DIAGNOSIS — I10 Essential (primary) hypertension: Secondary | ICD-10-CM | POA: Diagnosis present

## 2021-09-03 DIAGNOSIS — F0280 Dementia in other diseases classified elsewhere without behavioral disturbance: Secondary | ICD-10-CM | POA: Diagnosis not present

## 2021-09-03 DIAGNOSIS — Z7984 Long term (current) use of oral hypoglycemic drugs: Secondary | ICD-10-CM | POA: Diagnosis not present

## 2021-09-03 DIAGNOSIS — I255 Ischemic cardiomyopathy: Secondary | ICD-10-CM | POA: Diagnosis not present

## 2021-09-03 DIAGNOSIS — Z743 Need for continuous supervision: Secondary | ICD-10-CM | POA: Diagnosis not present

## 2021-09-03 DIAGNOSIS — I340 Nonrheumatic mitral (valve) insufficiency: Secondary | ICD-10-CM | POA: Diagnosis not present

## 2021-09-03 DIAGNOSIS — I351 Nonrheumatic aortic (valve) insufficiency: Secondary | ICD-10-CM | POA: Diagnosis not present

## 2021-09-03 HISTORY — DX: ST elevation (STEMI) myocardial infarction involving other coronary artery of anterior wall: I21.09

## 2021-09-03 LAB — COMPREHENSIVE METABOLIC PANEL
ALT: 12 U/L (ref 0–44)
AST: 32 U/L (ref 15–41)
Albumin: 3.5 g/dL (ref 3.5–5.0)
Alkaline Phosphatase: 103 U/L (ref 38–126)
Anion gap: 22 — ABNORMAL HIGH (ref 5–15)
BUN: 18 mg/dL (ref 8–23)
CO2: 17 mmol/L — ABNORMAL LOW (ref 22–32)
Calcium: 9.1 mg/dL (ref 8.9–10.3)
Chloride: 102 mmol/L (ref 98–111)
Creatinine, Ser: 1.36 mg/dL — ABNORMAL HIGH (ref 0.61–1.24)
GFR, Estimated: 51 mL/min — ABNORMAL LOW (ref >60)
Glucose, Bld: 167 mg/dL — ABNORMAL HIGH (ref 70–99)
Potassium: 4 mmol/L (ref 3.5–5.1)
Sodium: 141 mmol/L (ref 135–145)
Total Bilirubin: 1.6 mg/dL — ABNORMAL HIGH (ref 0.3–1.2)
Total Protein: 7.5 g/dL (ref 6.5–8.1)

## 2021-09-03 LAB — HEMOGLOBIN A1C
Hgb A1c MFr Bld: 5.3 % (ref 4.8–5.6)
Mean Plasma Glucose: 105.41 mg/dL

## 2021-09-03 LAB — CBC WITH DIFFERENTIAL/PLATELET
Abs Immature Granulocytes: 0.06 10*3/uL (ref 0.00–0.07)
Basophils Absolute: 0.1 10*3/uL (ref 0.0–0.1)
Basophils Relative: 0 %
Eosinophils Absolute: 0 10*3/uL (ref 0.0–0.5)
Eosinophils Relative: 0 %
HCT: 50 % (ref 39.0–52.0)
Hemoglobin: 16 g/dL (ref 13.0–17.0)
Immature Granulocytes: 1 %
Lymphocytes Relative: 19 %
Lymphs Abs: 2.4 10*3/uL (ref 0.7–4.0)
MCH: 28.7 pg (ref 26.0–34.0)
MCHC: 32 g/dL (ref 30.0–36.0)
MCV: 89.6 fL (ref 80.0–100.0)
Monocytes Absolute: 1 10*3/uL (ref 0.1–1.0)
Monocytes Relative: 8 %
Neutro Abs: 8.8 10*3/uL — ABNORMAL HIGH (ref 1.7–7.7)
Neutrophils Relative %: 72 %
Platelets: 357 10*3/uL (ref 150–400)
RBC: 5.58 MIL/uL (ref 4.22–5.81)
RDW: 13.7 % (ref 11.5–15.5)
WBC: 12.3 10*3/uL — ABNORMAL HIGH (ref 4.0–10.5)
nRBC: 0 % (ref 0.0–0.2)

## 2021-09-03 LAB — TROPONIN I (HIGH SENSITIVITY): Troponin I (High Sensitivity): 1956 ng/L (ref <18)

## 2021-09-03 LAB — LIPID PANEL
Cholesterol: 177 mg/dL (ref 0–200)
HDL: 40 mg/dL — ABNORMAL LOW (ref >40)
LDL Cholesterol: 111 mg/dL — ABNORMAL HIGH (ref 0–99)
Total CHOL/HDL Ratio: 4.4 RATIO
Triglycerides: 129 mg/dL (ref <150)
VLDL: 26 mg/dL (ref 0–40)

## 2021-09-03 LAB — PROTIME-INR
INR: 1.3 — ABNORMAL HIGH (ref 0.8–1.2)
Prothrombin Time: 15.8 seconds — ABNORMAL HIGH (ref 11.4–15.2)

## 2021-09-03 LAB — RESP PANEL BY RT-PCR (FLU A&B, COVID) ARPGX2
Influenza A by PCR: NEGATIVE
Influenza B by PCR: NEGATIVE
SARS Coronavirus 2 by RT PCR: POSITIVE — AB

## 2021-09-03 LAB — APTT: aPTT: 38 seconds — ABNORMAL HIGH (ref 24–36)

## 2021-09-03 MED ORDER — ASPIRIN 81 MG PO CHEW: 324.0000 mg | CHEWABLE_TABLET | Freq: Once | ORAL | Status: DC

## 2021-09-03 MED ORDER — AMIODARONE HCL IN DEXTROSE 360-4.14 MG/200ML-% IV SOLN
60.0000 mg/h | INTRAVENOUS | Status: AC
Start: 2021-09-03 — End: 2021-09-04
  Administered 2021-09-03 – 2021-09-04 (×4): 60 mg/h via INTRAVENOUS
  Filled 2021-09-03: qty 200

## 2021-09-03 MED ORDER — PANTOPRAZOLE SODIUM 40 MG PO TBEC
40.0000 mg | DELAYED_RELEASE_TABLET | Freq: Every day | ORAL | Status: DC
Start: 2021-09-04 — End: 2021-09-05
  Administered 2021-09-04 – 2021-09-05 (×2): 40 mg via ORAL
  Filled 2021-09-03 (×2): qty 1

## 2021-09-03 MED ORDER — NITROGLYCERIN 0.4 MG SL SUBL: 0.4000 mg | SUBLINGUAL_TABLET | SUBLINGUAL | Status: DC | PRN

## 2021-09-03 MED ORDER — LACOSAMIDE 50 MG PO TABS
150.0000 mg | ORAL_TABLET | Freq: Two times a day (BID) | ORAL | Status: DC
  Administered 2021-09-03 – 2021-09-10 (×14): 150 mg via ORAL
  Filled 2021-09-03 (×14): qty 3

## 2021-09-03 MED ORDER — MORPHINE SULFATE (PF) 2 MG/ML IV SOLN: 2.0000 mg | INTRAVENOUS | Status: DC | PRN

## 2021-09-03 MED ORDER — SODIUM CHLORIDE 0.9 % IV SOLN: INTRAVENOUS | Status: DC

## 2021-09-03 MED ORDER — CARBIDOPA-LEVODOPA 25-100 MG PO TABS
1.0000 | ORAL_TABLET | Freq: Three times a day (TID) | ORAL | Status: DC
  Administered 2021-09-04 – 2021-09-05 (×4): 1 via ORAL
  Filled 2021-09-03 (×6): qty 1

## 2021-09-03 MED ORDER — AMANTADINE HCL 100 MG PO CAPS
100.0000 mg | ORAL_CAPSULE | Freq: Every day | ORAL | Status: DC
  Administered 2021-09-04 (×2): 100 mg via ORAL
  Filled 2021-09-03 (×3): qty 1

## 2021-09-03 MED ORDER — ATORVASTATIN CALCIUM 40 MG PO TABS
40.0000 mg | ORAL_TABLET | Freq: Every day | ORAL | Status: DC
  Administered 2021-09-04 – 2021-09-05 (×2): 40 mg via ORAL
  Filled 2021-09-03 (×2): qty 1

## 2021-09-03 MED ORDER — ASPIRIN 300 MG RE SUPP: 300.0000 mg | Freq: Every day | RECTAL | Status: DC

## 2021-09-03 MED ORDER — ONDANSETRON HCL 4 MG/2ML IJ SOLN: 4.0000 mg | Freq: Four times a day (QID) | INTRAMUSCULAR | Status: DC | PRN

## 2021-09-03 MED ORDER — LEVOTHYROXINE SODIUM 75 MCG PO TABS
75.0000 ug | ORAL_TABLET | Freq: Every day | ORAL | Status: DC
  Administered 2021-09-04 – 2021-09-05 (×2): 75 ug via ORAL
  Filled 2021-09-03 (×2): qty 1

## 2021-09-03 MED ORDER — SODIUM CHLORIDE 0.9 % IV SOLN
INTRAVENOUS | Status: DC
  Administered 2021-09-03: 20 mL/h via INTRAVENOUS

## 2021-09-03 MED ORDER — HEPARIN (PORCINE) 25000 UT/250ML-% IV SOLN
1100.0000 [IU]/h | INTRAVENOUS | Status: DC
  Administered 2021-09-03: 750 [IU]/h via INTRAVENOUS
  Filled 2021-09-03: qty 250

## 2021-09-03 MED ORDER — ACETAMINOPHEN 325 MG PO TABS
650.0000 mg | ORAL_TABLET | ORAL | Status: DC | PRN
  Administered 2021-09-09 (×2): 650 mg via ORAL
  Filled 2021-09-03 (×2): qty 2

## 2021-09-03 MED ORDER — ASPIRIN 300 MG RE SUPP
300.0000 mg | Freq: Once | RECTAL | Status: AC
  Administered 2021-09-03: 300 mg via RECTAL

## 2021-09-03 MED ORDER — AMIODARONE HCL IN DEXTROSE 360-4.14 MG/200ML-% IV SOLN
30.0000 mg/h | INTRAVENOUS | Status: DC
Start: 2021-09-04 — End: 2021-09-05
  Administered 2021-09-04 (×3): 30 mg/h via INTRAVENOUS
  Filled 2021-09-03 (×2): qty 200

## 2021-09-03 MED ORDER — HEPARIN SODIUM (PORCINE) 5000 UNIT/ML IJ SOLN
4000.0000 [IU] | Freq: Once | INTRAMUSCULAR | Status: AC
Start: 2021-09-03 — End: 2021-09-03
  Administered 2021-09-03: 4000 [IU] via INTRAVENOUS

## 2021-09-03 MED ORDER — INSULIN ASPART 100 UNIT/ML IJ SOLN
0.0000 [IU] | Freq: Three times a day (TID) | INTRAMUSCULAR | Status: DC
  Administered 2021-09-04 – 2021-09-05 (×2): 1 [IU] via SUBCUTANEOUS

## 2021-09-03 MED ORDER — ASPIRIN EC 81 MG PO TBEC
81.0000 mg | DELAYED_RELEASE_TABLET | Freq: Every day | ORAL | Status: DC
  Administered 2021-09-04 – 2021-09-05 (×2): 81 mg via ORAL
  Filled 2021-09-03 (×2): qty 1

## 2021-09-03 MED ORDER — LEVETIRACETAM 500 MG PO TABS
1500.0000 mg | ORAL_TABLET | Freq: Two times a day (BID) | ORAL | Status: DC
  Administered 2021-09-03 – 2021-09-06 (×6): 1500 mg via ORAL
  Filled 2021-09-03 (×6): qty 3

## 2021-09-03 MED ORDER — AMIODARONE LOAD VIA INFUSION
150.0000 mg | Freq: Once | INTRAVENOUS | Status: AC
  Administered 2021-09-03: 150 mg via INTRAVENOUS
  Filled 2021-09-03: qty 83.34

## 2021-09-03 MED ORDER — LACTATED RINGERS IV SOLN: Freq: Once | INTRAVENOUS | Status: AC

## 2021-09-03 MED ORDER — HEPARIN SODIUM (PORCINE) 5000 UNIT/ML IJ SOLN: 60.0000 [IU]/kg | Freq: Once | INTRAMUSCULAR | Status: DC

## 2021-09-03 NOTE — Consult Note (Signed)
?Cardiology Consultation:  ? ?[name] ?MRN: [id number]; DOB: Jul 18, 1933 ? ?Admit date: 09/03/2021 ?Date of Consult: 09/03/2021 ? ?PCP:  [name], NP ?  ?[facility] Providers ?Cardiologist:  None      ? ? ?Patient Profile:  ? ?Gerald Leach is a 88 y.o. male with a hx of seizures who is being seen 09/03/2021 for the evaluation of NSTEMI at the request of Dr. Alcario Drought.. ? ?History of Present Illness:  ? ?Gerald Leach 88 year old male presents from home via EMS after he experienced altered status.  Patient has a baseline history of dementia, seizures and diabetes with Afib not on anticoagulation secondary to his dimentia and possibility of non compliance.  When EMS arrived, patient was found to be in monomorphic V. tach.  He was cardioverted x1 with 100 J.  He was in sinus rhythm but then his EKG developed into a STEMI pattern.  STEMI was called in the field and patient transported here. He ad subtle STE in V2-3 on EKG taken 10 min after the shock. Given his frail situation and dimentia, goals of care was planned to be discussed and plan was to decide on cath tomorrow after d/w family. Patient does not respond to questions and is spaced out. ? ? ?Past Medical History:  ?Diagnosis Date  ? Brain aneurysm   ? Colonic polyp 2003  ? Dementia ([place])   ? Diabetes mellitus without complication ([place])   ? ED (erectile dysfunction)   ? GERD (gastroesophageal reflux disease)   ? Hyperlipidemia   ? Hypertension   ? Hypothyroidism   ? Seizures ([place])   ? per family  ? Seizures ([place])   ? ? ?Past Surgical History:  ?Procedure Laterality Date  ? Aneurysmal clipping    ? brain aneurysm surgery    ? at [facility]  ? carotid artery aneurysm    ? COLONOSCOPY    ? polyps  ? CRANIOTOMY Right 09/19/2012  ? Procedure: CRANIOTOMY HEMATOMA EVACUATION SUBDURAL;  Surgeon: Otilio Connors, MD;  Location: [facility];  Service: Neurosurgery;  Laterality: Right;  ? CRANIOTOMY Right 09/22/2012  ? Procedure: CRANIOTOMY HEMATOMA EVACUATION  SUBDURAL;  Surgeon: Otilio Connors, MD;  Location: [facility];  Service: Neurosurgery;  Laterality: Right;  ? Craniotomy.    ? right hernia    ?  ? ? ? ?Inpatient Medications: ?Scheduled Meds: ? amantadine  100 mg Oral QHS  ? [START ON 09/04/2021] aspirin EC  81 mg Oral Daily  ? [START ON 09/04/2021] atorvastatin  40 mg Oral Daily  ? carbidopa-levodopa  1 tablet Oral TID  ? [START ON 09/04/2021] insulin aspart  0-9 Units Subcutaneous TID WC  ? Lacosamide  150 mg Oral BID  ? levETIRAcetam  1,500 mg Oral BID  ? [START ON 09/04/2021] levothyroxine  75 mcg Oral QAC breakfast  ? [START ON 09/04/2021] pantoprazole  40 mg Oral Daily  ? ?Continuous Infusions: ? sodium chloride 20 mL/hr (09/03/21 2157)  ? sodium chloride    ? amiodarone 60 mg/hr (09/03/21 2045)  ? Followed by  ? [START ON 09/04/2021] amiodarone    ? heparin    ? ?PRN Meds: ?acetaminophen, nitroGLYCERIN, ondansetron (ZOFRAN) IV ? ?Allergies:   No Known Allergies ? ?Social History:   ?Social History  ? ?Socioeconomic History  ? Marital status: Widowed  ?  Spouse name: Not on file  ? Number of children: 4  ? Years of education: 28  ? Highest education level: 12th grade  ?  Occupational History  ? Occupation: retired  ?Tobacco Use  ? Smoking status: Never  ?  Passive exposure: Past  ? Smokeless tobacco: Never  ?Vaping Use  ? Vaping Use: Never used  ?Substance and Sexual Activity  ? Alcohol use: Yes  ?  Alcohol/week: 0.0 standard drinks  ?  Comment: rum mixed in diet coke 3 a week  ? Drug use: Yes  ?  Types: Nitrous oxide  ? Sexual activity: Not Currently  ?Other Topics Concern  ? Not on file  ?Social History Narrative  ? ** Merged History Encounter **  ?    ? Lives with wife in a 2 story home.  Has no trouble with the stairs as long as he holds on to the banister.  Education: college.  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    ? ?Social Determinants of Health  ? ?Financial Resource Strain: Low Risk   ? Difficulty of Paying Living Expenses: Not  hard at all  ?Food Insecurity: No Food Insecurity  ? Worried About Charity fundraiser in the Last Year: Never true  ? Ran Out of Food in the Last Year: Never true  ?Transportation Needs: No Transportation Needs  ? Lack of Transportation (Medical): No  ? Lack of Transportation (Non-Medical): No  ?Physical Activity: Insufficiently Active  ? Days of Exercise per Week: 5 days  ? Minutes of Exercise per Session: 20 min  ?Stress: No Stress Concern Present  ? Feeling of Stress : Not at all  ?Social Connections: Moderately Isolated  ? Frequency of Communication with Friends and Family: More than three times a week  ? Frequency of Social Gatherings with Friends and Family: More than three times a week  ? Attends Religious Services: Never  ? Active Member of Clubs or Organizations: No  ? Attends Archivist Meetings: Never  ? Marital Status: Married  ?Intimate Partner Violence: Not At Risk  ? Fear of Current or Ex-Partner: No  ? Emotionally Abused: No  ? Physically Abused: No  ? Sexually Abused: No  ?  ?Family History:   ?Family History  ?Problem Relation Age of Onset  ? Liver disease Brother   ?     Died  ? Seizures Neg Hx   ?  ? ?ROS:  ?Please see the history of present illness.  ?Pt does not respond appropriately to questions  ? ?Physical Exam/Data:  ? ?Vitals:  ? 09/03/21 2055  ?BP: 122/84  ?Pulse: 96  ?Resp: 18  ? ?No intake or output data in the 24 hours ending 09/03/21 2232 ?Last 3 Weights 08/28/2021 06/14/2021 03/18/2021  ?Weight (lbs) 145 lb (No Data) 151 lb  ?Weight (kg) 65.772 kg (No Data) 68.493 kg  ?Some encounter information is confidential and restricted. Go to Review Flowsheets activity to see all data.  ?   ?There is no height or weight on file to calculate BMI.  ?General:  Well nourished, well developed, in no acute distress ?HEENT: normal ?Neck: no JVD ?Vascular: No carotid bruits; Distal pulses 2+ bilaterally ?Cardiac:  normal S1, S2; RRR; no murmur  ?Lungs:  clear to auscultation bilaterally, no  wheezing, rhonchi or rales  ?Abd: soft, nontender, no hepatomegaly  ?Ext: no edema ?Musculoskeletal:  No deformities, BUE and BLE strength normal and equal ?Skin: warm and dry  ?Neuro:  CNs 2-12 intact, no focal abnormalities noted ?Psych:  Normal affect  ? ?EKG:  The EKG was personally reviewed and demonstrates:  Sinus rhythm with  STE in V2-3 and STD in V4-5 ? ?Relevant CV Studies: ?He had an echo done previously that showed normal LVEF. ? ?Laboratory Data: ? ?High Sensitivity Troponin:   ?Recent Labs  ?Lab 09/03/21 ?2055  ?TROPONINIHS 1,956*  ?   ?Chemistry ?Recent Labs  ?Lab 09/03/21 ?2055  ?NA 141  ?K 4.0  ?CL 102  ?CO2 17*  ?GLUCOSE 167*  ?BUN 18  ?CREATININE 1.36*  ?CALCIUM 9.1  ?GFRNONAA 51*  ?ANIONGAP 22*  ?  ?Recent Labs  ?Lab 09/03/21 ?2055  ?PROT 7.5  ?ALBUMIN 3.5  ?AST 32  ?ALT 12  ?ALKPHOS 103  ?BILITOT 1.6*  ? ?Lipids  ?Recent Labs  ?Lab 09/03/21 ?2055  ?CHOL 177  ?TRIG 129  ?HDL 40*  ?LDLCALC 111*  ?CHOLHDL 4.4  ?  ?Hematology ?Recent Labs  ?Lab 09/03/21 ?2055  ?WBC 12.3*  ?RBC 5.58  ?HGB 16.0  ?HCT 50.0  ?MCV 89.6  ?MCH 28.7  ?MCHC 32.0  ?RDW 13.7  ?PLT 357  ? ? ?Radiology/Studies:  ?DG Chest Port 1 View ? ?Result Date: 09/03/2021 ?CLINICAL DATA:  Chest pain. EXAM: PORTABLE CHEST 1 VIEW COMPARISON:  08/11/2020 FINDINGS: Mild cardiomegaly. Aortic atherosclerosis and tortuosity. No pulmonary edema. No focal airspace disease, pneumothorax, or significant pleural effusion. Remote lateral left rib fractures. Superior subluxation of the right humeral head. IMPRESSION: Mild cardiomegaly. Electronically Signed   By: Keith Rake M.D.   On: 09/03/2021 21:22   ? ? ?Assessment and Plan:  ? ?STEMI/monomorphic VT: Patient had suspected STEMI but Dr. Martinique decided that it was fair that he not be treated aggressively given his frail situation and had to discuss goals of care with family. Recommended medical management of STEMI expect thrombolytics. Plan to decide on cath after d/w family. ?Atrial Fibrillation:  Currently in sinus rhythm and not on anticoagulation from before because of his frail situation and co morbidities and compliance issues. ? ? ?Risk Assessment/Risk Scores:  ?    :[id number]}   ?TIMI Ri

## 2021-09-03 NOTE — Assessment & Plan Note (Addendum)
Patient is on Sinemet and amantadine from home.  ?

## 2021-09-03 NOTE — Assessment & Plan Note (Addendum)
Likely secondary to ST elevation MI.  Cardioverted x1.  Patient was initiated on amiodarone drip which was changed to oral amiodarone ?

## 2021-09-03 NOTE — Assessment & Plan Note (Addendum)
Patient was having diarrhea and poor functioning at home likely secondary to COVID-19 infection.  Mild infection without any hypoxia.  No longer on isolation ?

## 2021-09-03 NOTE — Assessment & Plan Note (Addendum)
STEMI with monomorphic VT in field.  Patient was cardioverted x1 at the field site.  Seen by cardiology here and due to frailty and dementia not a candidate for left heart catheterization.  Patient is DNR.  Patient is on aspirin, Plavix and high intensity Lipitor, low-dose beta-blocker as tolerated,  nitroglycerin and amiodarone . Was on a heparin drip which was changed to Lovenox for total of 72hr. 2D echocardiogram with decreased LV function at 30 to 35% with septal apical and anterior wall hypokinesis.   ?

## 2021-09-03 NOTE — Progress Notes (Signed)
ANTICOAGULATION CONSULT NOTE - Initial Consult ? ?Pharmacy Consult for heparin ?Indication: chest pain/ACS ? ?No Known Allergies ? ?Patient Measurements: ?  ?Heparin Dosing Weight: 65.8 kg ? ?Vital Signs: ?BP: 122/84 (03/19 2055) ?Pulse Rate: 96 (03/19 2055) ? ?Labs: ?Recent Labs  ?  09/03/21 ?2055  ?HGB 16.0  ?HCT 50.0  ?PLT 357  ?APTT 38*  ?LABPROT 15.8*  ?INR 1.3*  ?CREATININE 1.36*  ?TROPONINIHS 1,956*  ? ? ?Estimated Creatinine Clearance: 37 mL/min (A) (by C-G formula based on SCr of 1.36 mg/dL (H)). ? ? ?Medical History: ?Past Medical History:  ?Diagnosis Date  ? Brain aneurysm   ? Colonic polyp 2003  ? Dementia ([place])   ? Diabetes mellitus without complication ([place])   ? ED (erectile dysfunction)   ? GERD (gastroesophageal reflux disease)   ? Hyperlipidemia   ? Hypertension   ? Hypothyroidism   ? Seizures ([place])   ? per family  ? Seizures ([place])   ? ? ?Medications:  ?Scheduled:  ? amantadine  100 mg Oral QHS  ? [START ON 09/04/2021] aspirin EC  81 mg Oral Daily  ? [START ON 09/04/2021] atorvastatin  40 mg Oral Daily  ? carbidopa-levodopa  1 tablet Oral TID  ? Lacosamide  150 mg Oral BID  ? levETIRAcetam  1,500 mg Oral BID  ? [START ON 09/04/2021] levothyroxine  75 mcg Oral QAC breakfast  ? [START ON 09/04/2021] pantoprazole  40 mg Oral Daily  ? ? ?Assessment: ?85 yom who had AMS and was found to be in [place], now s/p cardioversion x1. Paged out as CODE STEMI initially but EKG in ED improving- plan for medical management for ACS. No AC PTA. ? ?Hgb 16, plt 357. Trop 1956. Received 4000 unit heparin in ED on 3/19 at 2050. ? ?Goal of Therapy:  ?Heparin level 0.3-0.7 units/ml ?Monitor platelets by anticoagulation protocol: Yes ?  ?Plan:  ?Start heparin infusion at 750 units/hr ?Order heparin level in 8 hours ?Monitor daily HL, CBC, and for s/sx of bleeding  ? ?Antonietta Jewel, PharmD, BCCCP ?Clinical Pharmacist  ?Phone: [phone] ?09/03/2021 10:22 PM ? ?Please check AMION for all [facility] phone numbers ?After 10:00 PM,  call [facility] [phone] ? ? ? ?

## 2021-09-03 NOTE — Assessment & Plan Note (Addendum)
Continue keppra and vimpat. ?

## 2021-09-03 NOTE — Assessment & Plan Note (Addendum)
Metformin discontinued on discharge due to risk of lactic acidosis ?

## 2021-09-03 NOTE — ED Provider Notes (Signed)
?[facility] ?Provider Note ? ? ?CSN: [id number] ?Arrival date & time: 09/03/21  2050 ? ?  ? ?History ? ?Chief Complaint  ?Patient presents with  ? Code STEMI  ? ? ?MASAICHI KRACHT is a 88 y.o. male. ? ?88 year old male presents from home after he experienced altered status.  Patient has a baseline history of dementia.  When EMS arrived, patient was found to be in V. tach.  He was cardioverted x1.  He was in sinus rhythm but then his EKG developed into a STEMI pattern.  STEMI was called in the field and patient transported here. ? ? ?  ? ?Home Medications ?Prior to Admission medications   ?Medication Sig Start Date End Date Taking? Authorizing Provider  ?acetaminophen (TYLENOL) 325 MG tablet Take 2 tablets (650 mg total) by mouth every 6 (six) hours as needed for mild pain (or Fever >/= 101). 03/24/21   Raiford Noble Latif, DO  ?amantadine (SYMMETREL) 100 MG capsule Take 1 capsule (100 mg total) by mouth daily. 05/18/21   Narda Amber K, DO  ?carbidopa-levodopa (SINEMET IR) 25-100 MG tablet TAKE 1 TABLET BY MOUTH THREE TIMES A DAY 07/04/21   Narda Amber K, DO  ?Lacosamide 150 MG TABS Take 1 tablet (150 mg total) by mouth 2 (two) times daily. 05/31/21   Narda Amber K, DO  ?levETIRAcetam (KEPPRA) 750 MG tablet TAKE 2 TABLETS (1,500 MG TOTAL) BY MOUTH 2 (TWO) TIMES DAILY. 06/26/21   Narda Amber K, DO  ?levothyroxine (SYNTHROID) 75 MCG tablet TAKE 1 TABLET BY MOUTH EVERY DAY 07/04/21   Nafziger, Tommi Rumps, NP  ?metFORMIN (GLUCOPHAGE) 500 MG tablet TAKE 1 TABLET (500 MG TOTAL) BY MOUTH 2 (TWO) TIMES DAILY WITH A MEAL. NEED APPOINTMENT 05/19/21 06/18/21  [name], NP  ?omeprazole (PRILOSEC) 20 MG capsule TAKE 1 CAPSULE BY MOUTH EVERY DAY 05/10/21   Nafziger, Tommi Rumps, NP  ?ondansetron (ZOFRAN) 4 MG tablet Take 1 tablet (4 mg total) by mouth every 6 (six) hours as needed for nausea. 03/24/21   Kerney Elbe, DO  ?QUEtiapine (SEROQUEL) 25 MG tablet Take 0.5 tablets (12.5 mg total) by  mouth at bedtime. 03/24/21   Kerney Elbe, DO  ?vitamin B-12 (CYANOCOBALAMIN) 1000 MCG tablet Take 1,000 mcg by mouth daily.    [provider]  ?   ? ?Allergies    ?Patient has no known allergies.   ? ?Review of Systems   ?Review of Systems  ?Unable to perform ROS: Acuity of condition  ? ?Physical Exam ?Updated Vital Signs ?There were no vitals taken for this visit. ?Physical Exam ?Vitals and nursing note reviewed.  ?Constitutional:   ?   General: He is not in acute distress. ?   Appearance: Normal appearance. He is well-developed. He is not toxic-appearing.  ?HENT:  ?   Head: Normocephalic and atraumatic.  ?Eyes:  ?   General: Lids are normal.  ?   Conjunctiva/sclera: Conjunctivae normal.  ?   Pupils: Pupils are equal, round, and reactive to light.  ?Neck:  ?   Thyroid: No thyroid mass.  ?   Trachea: No tracheal deviation.  ?Cardiovascular:  ?   Rate and Rhythm: Normal rate and regular rhythm.  ?   Heart sounds: Normal heart sounds. No murmur heard. ?  No gallop.  ?Pulmonary:  ?   Effort: Pulmonary effort is normal. No respiratory distress.  ?   Breath sounds: Normal breath sounds. No stridor. No decreased breath sounds, wheezing, rhonchi or rales.  ?  Abdominal:  ?   General: There is no distension.  ?   Palpations: Abdomen is soft.  ?   Tenderness: There is no abdominal tenderness. There is no rebound.  ?Musculoskeletal:     ?   General: No tenderness. Normal range of motion.  ?   Cervical back: Normal range of motion and neck supple.  ?Skin: ?   General: Skin is warm and dry.  ?   Findings: No abrasion or rash.  ?Neurological:  ?   Mental Status: He is alert. He is disoriented and confused.  ?   GCS: GCS eye subscore is 4. GCS verbal subscore is 4. GCS motor subscore is 5.  ?   Cranial Nerves: No cranial nerve deficit.  ?   Sensory: No sensory deficit.  ?   Motor: No tremor.  ?Psychiatric:     ?   Attention and Perception: He is inattentive.  ? ? ?ED Results / Procedures / Treatments   ?Labs ?(all  labs ordered are listed, but only abnormal results are displayed) ?Labs Reviewed  ?RESP PANEL BY RT-PCR (FLU A&B, COVID) ARPGX2  ?CBC WITH DIFFERENTIAL/PLATELET  ?COMPREHENSIVE METABOLIC PANEL  ?APTT  ?HEMOGLOBIN A1C  ?PROTIME-INR  ?LIPID PANEL  ?TROPONIN I (HIGH SENSITIVITY)  ? ? ?EKG ?EKG Interpretation ? ?Date/Time:  Sunday September 03 2021 20:54:46 EDT ?Ventricular Rate:  92 ?PR Interval:  168 ?QRS Duration: 101 ?QT Interval:  479 ?QTC Calculation: 593 ?R Axis:   -73 ?Text Interpretation: Incomplete analysis due to missing data in precordial lead(s) Sinus rhythm Sinus pause Left anterior fascicular block Anteroseptal infarct, age indeterminate Prolonged QT interval Missing lead(s): V6 Confirmed by Lacretia Leigh ([id number]) on 09/03/2021 8:56:31 PM ? ?Radiology ?No results found. ? ?Procedures ?Procedures  ? ? ?Medications Ordered in ED ?Medications  ?0.9 %  sodium chloride infusion (has no administration in time range)  ?0.9 %  sodium chloride infusion (has no administration in time range)  ?aspirin chewable tablet 324 mg (has no administration in time range)  ?heparin injection 3,950 Units (has no administration in time range)  ? ? ?ED Course/ Medical Decision Making/ A&P ?  ?                        ?Medical Decision Making ?Amount and/or Complexity of Data Reviewed ?Labs: ordered. ?Radiology: ordered. ?ECG/medicine tests: ordered. ? ?Risk ?OTC drugs. ?Prescription drug management. ? ? ?Patient's EKG per my interpretation shows sinus rhythm but no signs of acute ST elevation.  Patient was heparinized per pharmacy.  Given rectal aspirin.  Code STEMI had been called in the field.  Awaiting cardiology involvement ? ?9:56 PM ?Patient seen by cardiologist who feels that patient will be medically managed at this time.  Has canceled code STEMI.  Patient was also given amiodarone 2.  No further ectopy.  Blood pressure and heart rate is stable at this time.  Troponin significantly elevated consistent with patient's ACS.  I  discussed his condition at length with his son.  It was agreed upon that patient will be made a DNR.  Plan will be for hospitalist admission.  I discussed case with Dr. Alcario Drought who will admit to the hospital service. ? ?CRITICAL CARE ?Performed by: Leota Jacobsen ?Total critical care time: 75 minutes ?Critical care time was exclusive of separately billable procedures and treating other patients. ?Critical care was necessary to treat or prevent imminent or life-threatening deterioration. ?Critical care was time spent personally by me on the  following activities: development of treatment plan with patient and/or surrogate as well as nursing, discussions with consultants, evaluation of patient's response to treatment, examination of patient, obtaining history from patient or surrogate, ordering and performing treatments and interventions, ordering and review of laboratory studies, ordering and review of radiographic studies, pulse oximetry and re-evaluation of patient's condition. ? ? ? ? ? ? ? ? ?Final Clinical Impression(s) / ED Diagnoses ?Final diagnoses:  ?None  ? ? ?Rx / DC Orders ?ED Discharge Orders   ? ? None  ? ?  ? ? ?  ?Lacretia Leigh, MD ?09/03/21 2158 ? ?

## 2021-09-03 NOTE — Assessment & Plan Note (Addendum)
Worsening mental status at home.  Patient was on carbidopa Vimpat Keppra and amantadine at home.  Family reports that overall mental status has improved and is actually better than baseline ?

## 2021-09-03 NOTE — H&P (Signed)
?History and Physical  ? ? ?Patient: Gerald Leach DOB: 05-10-1934 ?DOA: 09/03/2021 ?DOS: the patient was seen and examined on 09/03/2021 ?PCP: [name], NP  ?Patient coming from: Home ? ?Chief Complaint:  ?Chief Complaint  ?Patient presents with  ? Code STEMI  ? ?HPI: Gerald Leach is a 88 y.o. male with medical history significant of PD with dementia, seizure disorder on AEDs, HTN. ? ?Pt with worsening mental status for past week or two at home per son.  Minimally verbal this past week. ? ?Ate breakfast this morning. ? ?Today had sudden onset of severe AMS at home.  Son called EMS. ? ?On arrival EMS found pt to be on VT, cardioverted X1 back into NSR.  Then pt began to develop STEMI pattern on EKG.  Pt brought in to ED as code stemi. ? ?In ED: ?Cards has decided not to take him for LHC due to fairly advanced dementia and frail status at baseline (see their note).  Recommended amiodarone and medical management. ? ?No further ectopy with amiodarone. ? ?Son agreeable to DNR status. ?Review of Systems: unable to review all systems due to the inability of the patient to answer questions. ?Past Medical History:  ?Diagnosis Date  ? Brain aneurysm   ? Colonic polyp 2003  ? Dementia ([place])   ? Diabetes mellitus without complication ([place])   ? ED (erectile dysfunction)   ? GERD (gastroesophageal reflux disease)   ? Hyperlipidemia   ? Hypertension   ? Hypothyroidism   ? Seizures ([place])   ? per family  ? Seizures ([place])   ? ?Past Surgical History:  ?Procedure Laterality Date  ? Aneurysmal clipping    ? brain aneurysm surgery    ? at [facility]  ? carotid artery aneurysm    ? COLONOSCOPY    ? polyps  ? CRANIOTOMY Right 09/19/2012  ? Procedure: CRANIOTOMY HEMATOMA EVACUATION SUBDURAL;  Surgeon: Otilio Connors, MD;  Location: [facility];  Service: Neurosurgery;  Laterality: Right;  ? CRANIOTOMY Right 09/22/2012  ? Procedure: CRANIOTOMY HEMATOMA EVACUATION SUBDURAL;  Surgeon: Otilio Connors, MD;  Location: [facility];  Service: Neurosurgery;  Laterality: Right;  ? Craniotomy.    ? right hernia    ? ?Social History:  reports that he has never smoked. He has been exposed to tobacco smoke. He has never used smokeless tobacco. He reports current alcohol use. He reports current drug use. Drug: Nitrous oxide. ? ?No Known Allergies ? ?Family History  ?Problem Relation Age of Onset  ? Liver disease Brother   ?     Died  ? Seizures Neg Hx   ? ? ?Prior to Admission medications   ?Medication Sig Start Date End Date Taking? Authorizing Provider  ?acetaminophen (TYLENOL) 325 MG tablet Take 2 tablets (650 mg total) by mouth every 6 (six) hours as needed for mild pain (or Fever >/= 101). 03/24/21  Yes Sheikh, Omair Latif, DO  ?amantadine (SYMMETREL) 100 MG capsule Take 1 capsule (100 mg total) by mouth daily. ?Patient taking differently: Take 100 mg by mouth at bedtime. 05/18/21  Yes Patel, Donika K, DO  ?carbidopa-levodopa (SINEMET IR) 25-100 MG tablet TAKE 1 TABLET BY MOUTH THREE TIMES A DAY ?Patient taking differently: Take 1 tablet by mouth 3 (three) times daily. 07/04/21  Yes Patel, Donika K, DO  ?ferrous sulfate 325 (65 FE) MG tablet Take 325 mg by mouth daily with breakfast.   Yes [provider]  ?Lacosamide 150 MG  TABS Take 1 tablet (150 mg total) by mouth 2 (two) times daily. 05/31/21  Yes Patel, Donika K, DO  ?levETIRAcetam (KEPPRA) 750 MG tablet TAKE 2 TABLETS (1,500 MG TOTAL) BY MOUTH 2 (TWO) TIMES DAILY. 06/26/21  Yes Patel, Donika K, DO  ?levothyroxine (SYNTHROID) 75 MCG tablet TAKE 1 TABLET BY MOUTH EVERY DAY ?Patient taking differently: Take 75 mcg by mouth daily before breakfast. 07/04/21  Yes Nafziger, Tommi Rumps, NP  ?metFORMIN (GLUCOPHAGE) 500 MG tablet TAKE 1 TABLET (500 MG TOTAL) BY MOUTH 2 (TWO) TIMES DAILY WITH A MEAL. NEED APPOINTMENT ?Patient taking differently: Take 500 mg by mouth 2 (two) times daily with a meal. 05/19/21 09/03/21 Yes Nafziger, Tommi Rumps, NP  ?omeprazole (PRILOSEC) 20 MG capsule TAKE 1 CAPSULE BY MOUTH  EVERY DAY ?Patient taking differently: Take 20 mg by mouth daily. 05/10/21  Yes Nafziger, Tommi Rumps, NP  ?ondansetron (ZOFRAN) 4 MG tablet Take 1 tablet (4 mg total) by mouth every 6 (six) hours as needed for nausea. 03/24/21  Yes Sheikh, Omair Latif, DO  ?vitamin B-12 (CYANOCOBALAMIN) 1000 MCG tablet Take 1,000 mcg by mouth daily.   Yes [provider]  ? ? ?Physical Exam: ?Vitals:  ? 09/03/21 2055  ?BP: 122/84  ?Pulse: 96  ?Resp: 18  ? ?Constitutional: NAD, but alert. ?Eyes: PERRL, lids and conjunctivae normal ?ENMT: Mucous membranes are moist. Posterior pharynx clear of any exudate or lesions.Normal dentition.  ?Neck: normal, supple, no masses, no thyromegaly ?Respiratory: clear to auscultation bilaterally, no wheezing, no crackles. Normal respiratory effort. No accessory muscle use.  ?Cardiovascular: Regular rate and rhythm, no murmurs / rubs / gallops. No extremity edema. 2+ pedal pulses. No carotid bruits.  ?Abdomen: no tenderness, no masses palpated. No hepatosplenomegaly. Bowel sounds positive.  ?Musculoskeletal: no clubbing / cyanosis. No joint deformity upper and lower extremities. Good ROM, no contractures. Normal muscle tone.  ?Skin: no rashes, lesions, ulcers. No induration ?Neurologic: CN 2-12 grossly intact. Sensation intact, DTR normal. Strength 5/5 in all 4.  ?Psychiatric: Alert but disoriented and confused, not really verbal at the moment ? ?Data Reviewed: ? ?EKG in field showing STEMI ?CMP  ?   ?Component Value Date/Time  ? NA 141 09/03/2021 2055  ? K 4.0 09/03/2021 2055  ? CL 102 09/03/2021 2055  ? CO2 17 (L) 09/03/2021 2055  ? GLUCOSE 167 (H) 09/03/2021 2055  ? BUN 18 09/03/2021 2055  ? CREATININE 1.36 (H) 09/03/2021 2055  ? CREATININE 0.70 03/07/2015 1317  ? CALCIUM 9.1 09/03/2021 2055  ? PROT 7.5 09/03/2021 2055  ? PROT 7.5 03/31/2013 1535  ? ALBUMIN 3.5 09/03/2021 2055  ? AST 32 09/03/2021 2055  ? ALT 12 09/03/2021 2055  ? ALKPHOS 103 09/03/2021 2055  ? BILITOT 1.6 (H) 09/03/2021 2055  ?  GFRNONAA 51 (L) 09/03/2021 2055  ? GFRAA >60 03/07/2018 0814  ? ?CBC ?   ?Component Value Date/Time  ? WBC 12.3 (H) 09/03/2021 2055  ? RBC 5.58 09/03/2021 2055  ? HGB 16.0 09/03/2021 2055  ? HCT 50.0 09/03/2021 2055  ? PLT 357 09/03/2021 2055  ? MCV 89.6 09/03/2021 2055  ? MCH 28.7 09/03/2021 2055  ? MCHC 32.0 09/03/2021 2055  ? RDW 13.7 09/03/2021 2055  ? LYMPHSABS 2.4 09/03/2021 2055  ? MONOABS 1.0 09/03/2021 2055  ? EOSABS 0.0 09/03/2021 2055  ? BASOSABS 0.1 09/03/2021 2055  ? ?First trop is 1956 ? ?COVID is positive ? ?Assessment and Plan: ?* STEMI (ST elevation myocardial infarction) ([name]) ?Pt with ACS: STEMI with monomorphic VT  in field ?Cards consult, STEMI called by EDP ?not felt to be candidate for immediate LHC by cardiology (see note) ?May let pt eat per Dr. Juliane Lack (sounds like they are probably headed the conservative route ?DNR status per son ?ACS pathway ?Amiodarone ?Serial trops ?Tele monitor ?Heparin gtt ?ASA ?2d echo ordered ?Morphine PRN pain ordered (dont know that BP would tolerate NTG), regardless doesn't appear to be in significant discomfort or pain at the moment ? ?Ventricular tachycardia ?Secondary to ACS. ?Converted x1 by EMS ?No further ectopy since started on amiodarone ?Continue amiodarone IV. ?Tele monitor. ? ?COVID-19 virus infection ?Likely explains his diarrhea and poor functioning at home this past week or more. ?Mild at this time without O2 requirement. ?CXR neg. ?Will hold off on antiviral therapy for the moment. ?Check D.Dimer (but putting on heparin gtt for ACS anyhow). ? ?Dementia due to Parkinson'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 behavioral disturbance ([place]) ?With worsening mental status this past week per son. ?Still taking POs at home though ? ?Parkinson disease ([place]) ?Cont sinemet ? ?Diabetes mellitus type 2, controlled ([place]) ?Hold metformin ?Sensitive SSI AC ? ?Seizure disorder ([place]) ?Cont home keppra and vimpat. ? ? ? ? ? Advance Care Planning:   Code Status: DNR ? ?Consults:  Cardiology ? ?Family Communication: Son at bedside (until I had him evacuate the room after finding out that pt tested positive for COVID and son is a lung transplant patient) ? ?Severity of Illness: ?The appropriat

## 2021-09-04 ENCOUNTER — Inpatient Hospital Stay (HOSPITAL_COMMUNITY): Payer: Medicare Other

## 2021-09-04 DIAGNOSIS — I351 Nonrheumatic aortic (valve) insufficiency: Secondary | ICD-10-CM

## 2021-09-04 DIAGNOSIS — E785 Hyperlipidemia, unspecified: Secondary | ICD-10-CM

## 2021-09-04 DIAGNOSIS — G20 Parkinson's disease: Secondary | ICD-10-CM | POA: Diagnosis not present

## 2021-09-04 DIAGNOSIS — I480 Paroxysmal atrial fibrillation: Secondary | ICD-10-CM

## 2021-09-04 DIAGNOSIS — U071 COVID-19: Secondary | ICD-10-CM | POA: Diagnosis not present

## 2021-09-04 DIAGNOSIS — I255 Ischemic cardiomyopathy: Secondary | ICD-10-CM

## 2021-09-04 DIAGNOSIS — I340 Nonrheumatic mitral (valve) insufficiency: Secondary | ICD-10-CM | POA: Diagnosis not present

## 2021-09-04 DIAGNOSIS — E119 Type 2 diabetes mellitus without complications: Secondary | ICD-10-CM | POA: Diagnosis not present

## 2021-09-04 DIAGNOSIS — I213 ST elevation (STEMI) myocardial infarction of unspecified site: Secondary | ICD-10-CM | POA: Diagnosis not present

## 2021-09-04 LAB — BASIC METABOLIC PANEL
Anion gap: 19 — ABNORMAL HIGH (ref 5–15)
BUN: 23 mg/dL (ref 8–23)
CO2: 17 mmol/L — ABNORMAL LOW (ref 22–32)
Calcium: 8.6 mg/dL — ABNORMAL LOW (ref 8.9–10.3)
Chloride: 102 mmol/L (ref 98–111)
Creatinine, Ser: 1.21 mg/dL (ref 0.61–1.24)
GFR, Estimated: 59 mL/min — ABNORMAL LOW (ref >60)
Glucose, Bld: 104 mg/dL — ABNORMAL HIGH (ref 70–99)
Potassium: 4.4 mmol/L (ref 3.5–5.1)
Sodium: 138 mmol/L (ref 135–145)

## 2021-09-04 LAB — CBC
HCT: 45 % (ref 39.0–52.0)
Hemoglobin: 14.9 g/dL (ref 13.0–17.0)
MCH: 28.9 pg (ref 26.0–34.0)
MCHC: 33.1 g/dL (ref 30.0–36.0)
MCV: 87.2 fL (ref 80.0–100.0)
Platelets: 317 10*3/uL (ref 150–400)
RBC: 5.16 MIL/uL (ref 4.22–5.81)
RDW: 13.7 % (ref 11.5–15.5)
WBC: 12 10*3/uL — ABNORMAL HIGH (ref 4.0–10.5)
nRBC: 0 % (ref 0.0–0.2)

## 2021-09-04 LAB — URINALYSIS, ROUTINE W REFLEX MICROSCOPIC
Bilirubin Urine: NEGATIVE
Glucose, UA: NEGATIVE mg/dL
Hgb urine dipstick: NEGATIVE
Ketones, ur: 20 mg/dL — AB
Leukocytes,Ua: NEGATIVE
Nitrite: NEGATIVE
Protein, ur: 30 mg/dL — AB
Specific Gravity, Urine: 1.036 — ABNORMAL HIGH (ref 1.005–1.030)
pH: 5 (ref 5.0–8.0)

## 2021-09-04 LAB — GLUCOSE, CAPILLARY
Glucose-Capillary: 110 mg/dL — ABNORMAL HIGH (ref 70–99)
Glucose-Capillary: 115 mg/dL — ABNORMAL HIGH (ref 70–99)
Glucose-Capillary: 117 mg/dL — ABNORMAL HIGH (ref 70–99)
Glucose-Capillary: 127 mg/dL — ABNORMAL HIGH (ref 70–99)
Glucose-Capillary: 106 mg/dL — ABNORMAL HIGH (ref 70–99)

## 2021-09-04 LAB — HEPARIN LEVEL (UNFRACTIONATED)
Heparin Unfractionated: 0.11 IU/mL — ABNORMAL LOW (ref 0.30–0.70)
Heparin Unfractionated: 0.25 IU/mL — ABNORMAL LOW (ref 0.30–0.70)

## 2021-09-04 LAB — ECHOCARDIOGRAM COMPLETE
Calc EF: 38.1 %
Height: 72 in
S' Lateral: 3.7 cm
Single Plane A2C EF: 42.8 %
Single Plane A4C EF: 31.4 %
Weight: 2377.44 oz

## 2021-09-04 LAB — D-DIMER, QUANTITATIVE: D-Dimer, Quant: 1.26 ug/mL-FEU — ABNORMAL HIGH (ref 0.00–0.50)

## 2021-09-04 LAB — TROPONIN I (HIGH SENSITIVITY): Troponin I (High Sensitivity): 2182 ng/L (ref <18)

## 2021-09-04 MED ORDER — SODIUM CHLORIDE 0.9 % IV SOLN
Freq: Once | INTRAVENOUS | Status: AC
Start: 2021-09-04 — End: 2021-09-04

## 2021-09-04 MED ORDER — METOPROLOL TARTRATE 12.5 MG HALF TABLET
12.5000 mg | ORAL_TABLET | Freq: Two times a day (BID) | ORAL | Status: DC
  Administered 2021-09-04 (×2): 12.5 mg via ORAL
  Filled 2021-09-04 (×2): qty 1

## 2021-09-04 MED ORDER — PERFLUTREN LIPID MICROSPHERE
1.0000 mL | INTRAVENOUS | Status: AC | PRN
  Administered 2021-09-04: 2 mL via INTRAVENOUS
  Filled 2021-09-04: qty 10

## 2021-09-04 MED ORDER — HEPARIN BOLUS VIA INFUSION
2000.0000 [IU] | Freq: Once | INTRAVENOUS | Status: AC
  Administered 2021-09-04: 2000 [IU] via INTRAVENOUS
  Filled 2021-09-04: qty 2000

## 2021-09-04 MED ORDER — ENOXAPARIN SODIUM 80 MG/0.8ML IJ SOSY
70.0000 mg | PREFILLED_SYRINGE | Freq: Two times a day (BID) | INTRAMUSCULAR | Status: DC
  Administered 2021-09-04: 70 mg via SUBCUTANEOUS
  Filled 2021-09-04: qty 0.8

## 2021-09-04 NOTE — Progress Notes (Signed)
NTICOAGULATION CONSULT NOTE\ ? ?Pharmacy Consult for heparin ?Indication: chest pain/ACS ? ?No Known Allergies ? ?Patient Measurements: ?Height: 6' (182.9 cm) ?Weight: 67.4 kg (148 lb 9.4 oz) ?IBW/kg (Calculated) : 77.6 ?Heparin Dosing Weight: 65.8 kg ? ?Vital Signs: ?Temp: 97.7 ?F (36.5 ?C) (03/20 5997) ?Temp Source: Axillary (03/20 7414) ?BP: 100/52 (03/20 2395) ?Pulse Rate: 70 (03/20 0812) ? ?Labs: ?Recent Labs  ?  09/03/21 ?2055 09/04/21 ?0216 09/04/21 ?3202  ?HGB 16.0 14.9  --   ?HCT 50.0 45.0  --   ?PLT 357 317  --   ?APTT 38*  --   --   ?LABPROT 15.8*  --   --   ?INR 1.3*  --   --   ?HEPARINUNFRC  --   --  0.11*  ?CREATININE 1.36* 1.21  --   ?TROPONINIHS 1,956* 2,182*  --   ? ? ?Estimated Creatinine Clearance: 42.6 mL/min (by C-G formula based on SCr of 1.21 mg/dL). ? ? ?Medical History: ?Past Medical History:  ?Diagnosis Date  ? Brain aneurysm   ? Colonic polyp 2003  ? Dementia ([place])   ? Diabetes mellitus without complication ([place])   ? ED (erectile dysfunction)   ? GERD (gastroesophageal reflux disease)   ? Hyperlipidemia   ? Hypertension   ? Hypothyroidism   ? Seizures ([place])   ? per family  ? Seizures ([place])   ? ? ?Assessment: ?24 yom who had AMS and was found to be in [place], now s/p cardioversion x1. Paged out as CODE STEMI initially but EKG in ED improving- plan for medical management for ACS. No AC PTA. ? ?Initial heparin level low at 0.11, hgb down 16>14.9, plt 317. D-dimer 1.26. Trop elevated >2000. No bleeding issues noted.  ? ?Goal of Therapy:  ?Heparin level 0.3-0.7 units/ml ?Monitor platelets by anticoagulation protocol: Yes ?  ?Plan:  ?Rebolus heparin at 2000 units  ?Increase heparin infusion at 950 units/hr ?Order heparin level in 8 hours ?Monitor daily HL, CBC, and for s/sx of bleeding  ? ?Erin Hearing PharmD., BCPS ?Clinical Pharmacist ?09/04/2021 8:31 AM ? ?

## 2021-09-04 NOTE — Progress Notes (Signed)
?  Echocardiogram ?2D Echocardiogram with contrast has been performed. ? ?Gerald Leach ?09/04/2021, 10:17 AM ?

## 2021-09-04 NOTE — Progress Notes (Signed)
?  Transition of Care (TOC) Screening Note ? ? ?Patient Details  ?Name: Gerald Leach ?Date of Birth: Jun 16, 1934 ? ? ?Transition of Care (TOC) CM/SW Contact:    ?Milas Gain, LCSWA ?Phone Number: ?09/04/2021, 12:38 PM ? ? ? ?Transition of Care Department [facility]) has reviewed patient and no TOC needs have been identified at this time. We will continue to monitor patient advancement through interdisciplinary progression rounds. If new patient transition needs arise, please place a TOC consult. ?  ?

## 2021-09-04 NOTE — Progress Notes (Addendum)
NTICOAGULATION CONSULT NOTE\ ? ?Pharmacy Consult for heparin>>lovenox ?Indication: chest pain/ACS ? ?No Known Allergies ? ?Patient Measurements: ?Height: 6' (182.9 cm) ?Weight: 67.4 kg (148 lb 9.4 oz) ?IBW/kg (Calculated) : 77.6 ?Heparin Dosing Weight: 65.8 kg ? ?Vital Signs: ?Temp: 98.2 ?F (36.8 ?C) (03/20 1658) ?Temp Source: Axillary (03/20 1658) ?BP: 102/67 (03/20 1644) ?Pulse Rate: 58 (03/20 1658) ? ?Labs: ?Recent Labs  ?  09/03/21 ?2055 09/04/21 ?0216 09/04/21 ?1308 09/04/21 ?1657  ?HGB 16.0 14.9  --   --   ?HCT 50.0 45.0  --   --   ?PLT 357 317  --   --   ?APTT 38*  --   --   --   ?LABPROT 15.8*  --   --   --   ?INR 1.3*  --   --   --   ?HEPARINUNFRC  --   --  0.11* 0.25*  ?CREATININE 1.36* 1.21  --   --   ?TROPONINIHS 1,956* 2,182*  --   --   ? ? ? ?Estimated Creatinine Clearance: 42.6 mL/min (by C-G formula based on SCr of 1.21 mg/dL). ? ? ?Medical History: ?Past Medical History:  ?Diagnosis Date  ? Brain aneurysm   ? Colonic polyp 2003  ? Dementia ([place])   ? Diabetes mellitus without complication ([place])   ? ED (erectile dysfunction)   ? GERD (gastroesophageal reflux disease)   ? Hyperlipidemia   ? Hypertension   ? Hypothyroidism   ? Seizures ([place])   ? per family  ? Seizures ([place])   ? ? ?Assessment: ?12 yom who had AMS and was found to be in [place], now s/p cardioversion x1. Paged out as CODE STEMI initially but EKG in ED improving- plan for medical management for ACS. No AC PTA. ? ?Initial heparin level low at 0.11, hgb down 16>14.9, plt 317. D-dimer 1.26. Trop elevated >2000. No bleeding issues noted.  ? ?Heparin came back subtherapeutic again at 0.25. Plan for 72 hrs. We will increase the dose for now but will message to change to Lovenox.  ? ?Addendum ? ?Ok to change to Lovenox per Dr. Louanne Belton. ? ?Goal of Therapy:  ?Anti-Xa 0.6-1 ?Monitor platelets by anticoagulation protocol: Yes ?  ?Plan:  ? ?Heparin>>Lovenox '70mg'$  SQ BID ?F/u dc at 72hrs ? ?Onnie Boer, PharmD, BCIDP, AAHIVP, CPP ?Infectious Disease  Pharmacist ?09/04/2021 5:42 PM ? ? ? ?

## 2021-09-04 NOTE — Progress Notes (Addendum)
? ?Progress Note ? ?Patient Name: Gerald Leach ?Date of Encounter: 09/04/2021 ? ?[facility] Cardiologist: Previously seen by Dr. Gwenlyn Found in 2018 ? ?Subjective  ? ?Admitted overnight with VT and concern for STEMI. Patient has underlying Parkinson's disease and dementia. He currently has mittens on. Unclear what his baseline is as no family at bedside this morning but patient is just staring off in space this morning. He did deny any pain when asked but unable to get any additional information.  ?=> He was seen overnight by Dr. [place] who felt that he was not a good candidate for cardiac catheterization based on his level of dementia. ? ?Inpatient Medications  ?  ?Scheduled Meds: ? amantadine  100 mg Oral QHS  ? aspirin EC  81 mg Oral Daily  ? atorvastatin  40 mg Oral Daily  ? carbidopa-levodopa  1 tablet Oral TID  ? heparin  2,000 Units Intravenous Once  ? insulin aspart  0-9 Units Subcutaneous TID WC  ? lacosamide  150 mg Oral BID  ? levETIRAcetam  1,500 mg Oral BID  ? levothyroxine  75 mcg Oral Q0600  ? metoprolol tartrate  12.5 mg Oral BID  ? pantoprazole  40 mg Oral Daily  ? ?Continuous Infusions: ? amiodarone 30 mg/hr (09/04/21 0400)  ? heparin 750 Units/hr (09/04/21 0400)  ? ?PRN Meds: ?acetaminophen, morphine injection, nitroGLYCERIN, ondansetron (ZOFRAN) IV, perflutren lipid microspheres (DEFINITY) IV suspension  ? ?Vital Signs  ?  ?Vitals:  ? 09/04/21 0037 09/04/21 6203 09/04/21 5597 09/04/21 4163  ?BP: 109/75 103/61  (!) 100/52  ?Pulse: 71 72  70  ?Resp: '19 20  19  '$ ?Temp: 97.9 ?F (36.6 ?C) 97.6 ?F (36.4 ?C) 97.7 ?F (36.5 ?C)   ?TempSrc: Axillary Oral Axillary   ?SpO2: 100% 100%  100%  ?Weight: 64.6 kg 67.4 kg    ?Height: 6' (1.829 m)     ? ? ?Intake/Output Summary (Last 24 hours) at 09/04/2021 1018 ?Last data filed at 09/04/2021 0400 ?Gross per 24 hour  ?Intake 633.48 ml  ?Output --  ?Net 633.48 ml  ? ?Last 3 Weights 09/04/2021 09/04/2021 08/28/2021  ?Weight (lbs) 148 lb 9.4 oz 142 lb 6.7 oz 145 lb  ?Weight  (kg) 67.4 kg 64.6 kg 65.772 kg  ?   ? ?Telemetry  ?  ?Normal sinus rhythm with rates in the 60s to 70s.  - Personally Reviewed ? ?ECG  ?  ?Normal s - Personally Reviewed ? ?Physical Exam  ? ?GEN: No acute distress.   ?Neck: No JVD.  ?Cardiac: RRR. No murmurs, rubs, or gallops.  ?Respiratory: Clear to auscultation bilaterally. No wheezes, rhonchi, or rales. ?GI: Soft, non-distended, and non-tender. ?MS: No lower extremity edema. No deformity. ?Neuro:  Alert but not oriented. Underlying dementia. Currently wearing mittens. ?Psych: Staring off in space.  ? ?Labs  ?  ?High Sensitivity Troponin:   ?Recent Labs  ?Lab 09/03/21 ?2055 09/04/21 ?0216  ?TROPONINIHS 8,453* 2,182*  ?   ?Chemistry ?Recent Labs  ?Lab 09/03/21 ?2055 09/04/21 ?0216  ?NA 141 138  ?K 4.0 4.4  ?CL 102 102  ?CO2 17* 17*  ?GLUCOSE 167* 104*  ?BUN 18 23  ?CREATININE 1.36* 1.21  ?CALCIUM 9.1 8.6*  ?PROT 7.5  --   ?ALBUMIN 3.5  --   ?AST 32  --   ?ALT 12  --   ?ALKPHOS 103  --   ?BILITOT 1.6*  --   ?GFRNONAA 51* 59*  ?ANIONGAP 22* 19*  ?  ?Lipids  ?Recent Labs  ?  Lab 09/03/21 ?2055  ?CHOL 177  ?TRIG 129  ?HDL 40*  ?LDLCALC 111*  ?CHOLHDL 4.4  ?  ?Hematology ?Recent Labs  ?Lab 09/03/21 ?2055 09/04/21 ?0216  ?WBC 12.3* 12.0*  ?RBC 5.58 5.16  ?HGB 16.0 14.9  ?HCT 50.0 45.0  ?MCV 89.6 87.2  ?MCH 28.7 28.9  ?MCHC 32.0 33.1  ?RDW 13.7 13.7  ?PLT 357 317  ? ?Thyroid No results for input(s): TSH, FREET4 in the last 168 hours.  ?BNPNo results for input(s): BNP, PROBNP in the last 168 hours.  ?DDimer  ?Recent Labs  ?Lab 09/04/21 ?0216  ?DDIMER 1.26*  ?  ? ?Radiology  ?  ?DG Chest Port 1 View ? ?Result Date: 09/03/2021 ?CLINICAL DATA:  Chest pain. EXAM: PORTABLE CHEST 1 VIEW COMPARISON:  08/11/2020 FINDINGS: Mild cardiomegaly. Aortic atherosclerosis and tortuosity. No pulmonary edema. No focal airspace disease, pneumothorax, or significant pleural effusion. Remote lateral left rib fractures. Superior subluxation of the right humeral head. IMPRESSION: Mild cardiomegaly.  Electronically Signed   By: Keith Rake M.D.   On: 09/03/2021 21:22   ? ?Cardiac Studies  ? ?Echo pending. ?Echo report after being seen in initial rounds: ?EF 30 to 35% with poorly visualized apex but septal apical and anterior wall hypokinesis.  Global hypokinesis.  Mild aortic calcification-sclerosis but no stenosis.  Ascending aorta is roughly 40 mm.  Normal RAP with mildly dilated RA.Marland Kitchen  Normal RVP.  Mild ? ?Patient Profile  ?   ?88 y.o. male with a history of paroxysmal atrial fibrillation not on anticoagulation due dementia and concern for non-compliance, hyperlipidemia, type 2 diabetes mellitus, seizures, and Parkinson's disease with dementia who was admitted on 09/03/2021 with STEMI after being found with altered mental status at home. Upon EMS arrival, he was noted to be in monomorphic VT and was cardioverted x1 with 100 with restoration of sinus rhythm. EKG after following restoration of sinus rhythm was concerning for STEMI but improved on arrival to ED. He was also found to be positive for COVID. Given patient's advanced age, frailty, and dementia, decision was made not to proceed with emergent cardiac catheterization and plan to discuss goals of care with family. ? ?Assessment & Plan  ?  ?STEMI ?Monomorphic VT ?Patient was found to have altered mental status at home and when EMS arrive was noted to be in monomorphic VT. He was cardioverted in the field with restoration of sinus rhythm. EKG following restoration of sinus rhythm was concerning for STEMI. EKG in the ED showed subtle ST elevation in V2-V3 and ST depression in V4-V5. High-sensitivity troponin elevated at 1,956 >> 2,182. Given patient's advanced age, frailty, and dementia, decision was made not to proceed with emergent cardiac catheterization and plan to discuss goals of care with family. ?- Patient is very altered and is just staring off this morning. No family at bedside so unclear what his baseline is. However, he denies any pain this  morning. ?- Echo pending. Per Echo tech, EF looks mildly reduced around 35% and looks to have some wall motion around apex. ?- Continue IV Heparin. Suspect we will continue this for 72 hours. ?- Continue IV Amiodarone for now. ?- Continue aspirin and high-intensity statin. BP soft at time but will add low dose Lopressor 12.'5mg'$  twice daily (will place hold parameters to hold if systolic BP <254). ?- No family is at bedside but I think we should treat medically given extreme frailty and dementia. No plans for cardiac catheterization at this time. ?-> Had a long talk  with the patient's son.  He agrees with no plans for invasive evaluation.  Explained to him the reduced pump function.  Echo does appear that he has had an anterior STEMI.  For now plan will be medical management =>  ?IV heparin for 72 hours total,  ?aspirin/Plavix, statin. ?Low-dose beta-blocker if tolerated, but with amiodarone on board, may not be necessary. ?Continue amiodarone IV overnight and switch to p.o. 200 mg twice daily for 2 weeks and then 200 mg once daily to avoid further VT.  ?No plans for invasive evaluation, he is DNR/DNI.  Would prefer conservative management-son agrees. ? ? ?Paroxysmal Atrial Fibrillation ?History of atrial fibrillation but has not been on anticoagulation given frailty, dementia, and concern for non-compliance. ?- Maintaining sinus rhythm at this time. ?- Will add low dose beta-blocker as above. ?We will be on amiodarone, so may not likely be a significant issue.  Agree with holding off on DOAC.  Concern for falls. ? ?Hyperlipidemia ?Lipid panel this admission: Total Cholesterol 177, Triglycerides 129, HDL 40, LDL 111.  ?- Started on Lipitor '40mg'$  daily this admission. Continue. ? ?Otherwise, per primary team: ?- COVID infection ?- Elevated D-dimer => interesting in the setting of COVID, MI and VT ?- Type 2 diabetes mellitus ?- Parkinson's disease ?- Dementia ?- Seizures ? ?For questions or updates, please contact [facility] ?Please consult [email or web address] for contact info under  ? ?  ?   ?Signed, ?Darreld Mclean, PA-C  ?09/04/2021, 10:18 AM  . ? ? ?ATTENDING ATTESTATION ? ?I have seen, examined and evaluated the

## 2021-09-04 NOTE — Progress Notes (Addendum)
PROGRESS NOTE    [name]  ZOX:[id number] DOB: 05-15-1934 DOA: 09/03/2021 PCP: Gerald Frees, NP    Brief Narrative:  Gerald Leach is a 88 y.o. male with medical history significant of Parkinson's disease with dementia, seizure disorder on antiepileptics, hypertension presented to hospital with worsening mental status for a week or two and was minimally verbal for 1 week.  Patient had sudden onset of severe altered mental status at home and EMS was called in.  EMS noted that the patient was in ventricular tachycardia and cardioverted and had the EKG pattern of ST elevation MI.  Patient was then brought into the hospital as a code STEMI.  In the ED cardiology was consulted and due to his frail status with advanced dementia conservative treatment was pursued.  Patient was a started on amiodarone and heparin drip and was admitted to the hospital for further evaluation and treatment     Assessment and Plan: * STEMI (ST elevation myocardial infarction) (HCC)  STEMI with monomorphic VT in field.  Patient was cardioverted in the field.  Cardiology was consulted and due to frailty and dementia not a candidate for left heart catheterization.  Patient is DNR. Continue aspirin and high intensity Lipitor, low-dose beta-blocker as tolerated, heparin drip nitroglycerin and amiodarone at this time. Follow cardiology recommendation.  Check 2D echocardiogram.  Ventricular tachycardia Likely secondary to ST elevation MI.  Cardioverted x1.  On amiodarone drip at this time.Marland Kitchen   COVID-19 virus infection Patient was having diarrhea and poor functioning at home likely secondary to COVID-19 infection.  Mild infection without any hypoxia.  No need for antiretroviral treatment at this time.  Dementia due to Parkinson'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 behavioral disturbance (HCC) Worsening mental status at home.  Continue supportive care.  On carbidopa Vimpat Keppra and amantadine at home.  We will continue.  Parkinson disease  (HCC) Cont sinemet  Diabetes mellitus type 2, controlled (HCC) Continue sliding scale insulin.  Patient is on metformin at home.  We will continue to monitor closely.  Seizure disorder (HCC) Cont home keppra and vimpat.  PAF (paroxysmal atrial fibrillation) (HCC) Patient has history of atrial fibrillation but not on anticoagulation as outpatient.  Currently normal sinus rhythm.  Low-dose beta-blocker will be initiated as per cardiology.  Hyperlipidemia Continue Lipitor.    DVT prophylaxis:   On heparin drip.   Code Status:     Code Status: DNR  Disposition: Home Status is: Inpatient  Remains inpatient appropriate because: ST elevation MI, heparin drip amiodarone drip,   Family Communication:  No one at bedside.  Consultants:  Cardiology  Procedures:  None  Antimicrobials:  None  Anti-infectives (From admission, onward)    None       Subjective: Today, patient was seen and examined at bedside.  Patient is confused disoriented on mittens.  Underlying dementia.  Poor historian.  Objective: Vitals:   09/04/21 0718 09/04/21 0812 09/04/21 1114 09/04/21 1206  BP:  (!) 100/52 108/60 106/70  Pulse:  70 66 68  Resp:  19 19   Temp: 97.7 F (36.5 C)  97.9 F (36.6 C)   TempSrc: Axillary  Oral   SpO2:  100%    Weight:      Height:        Intake/Output Summary (Last 24 hours) at 09/04/2021 1302 Last data filed at 09/04/2021 0400 Gross per 24 hour  Intake 633.48 ml  Output --  Net 633.48 ml   Filed Weights   09/04/21 0037 09/04/21 8119  Weight: 64.6 kg 67.4 kg   Body mass index is 20.15 kg/m.  Physical Examination:  General:  Average built, not in obvious distress, underlying dementia, on mittens, HENT:   No scleral pallor or icterus noted. Oral mucosa is moist.  Chest: Diminished breath sounds bilaterally. No crackles or wheezes.  CVS: S1 &S2 heard. No murmur.  Regular rate and rhythm. Abdomen: Soft, nontender, nondistended.  Bowel sounds are heard.    Extremities: No cyanosis, clubbing or edema.  Peripheral pulses are palpable. Psych: Underlying dementia, on mittens, alert awake. CNS: Moves all extremities, underlying dementia, alert awake Skin: Warm and dry.  No rashes noted.  Data Reviewed:   CBC: Recent Labs  Lab 09/03/21 2055 09/04/21 0216  WBC 12.3* 12.0*  NEUTROABS 8.8*  --   HGB 16.0 14.9  HCT 50.0 45.0  MCV 89.6 87.2  PLT 357 317    Basic Metabolic Panel: Recent Labs  Lab 09/03/21 2055 09/04/21 0216  NA 141 138  K 4.0 4.4  CL 102 102  CO2 17* 17*  GLUCOSE 167* 104*  BUN 18 23  CREATININE 1.36* 1.21  CALCIUM 9.1 8.6*    Liver Function Tests: Recent Labs  Lab 09/03/21 2055  AST 32  ALT 12  ALKPHOS 103  BILITOT 1.6*  PROT 7.5  ALBUMIN 3.5     Radiology Studies: DG Chest Port 1 View  Result Date: 09/03/2021 CLINICAL DATA:  Chest pain. EXAM: PORTABLE CHEST 1 VIEW COMPARISON:  08/11/2020 FINDINGS: Mild cardiomegaly. Aortic atherosclerosis and tortuosity. No pulmonary edema. No focal airspace disease, pneumothorax, or significant pleural effusion. Remote lateral left rib fractures. Superior subluxation of the right humeral head. IMPRESSION: Mild cardiomegaly. Electronically Signed   By: Narda Rutherford M.D.   On: 09/03/2021 21:22   ECHOCARDIOGRAM COMPLETE  Result Date: 09/04/2021    ECHOCARDIOGRAM REPORT   Patient Name:   Gerald Leach Date of Exam: 09/04/2021 Medical Rec #:  [id number]      Height:       72.0 in Accession #:    [id number]     Weight:       148.6 lb Date of Birth:  08-05-1933       BSA:          1.878 m Patient Age:    87 years       BP:           120/71 mmHg Patient Gender: M              HR:           64 bpm. Exam Location:  Inpatient Procedure: 2D Echo, Cardiac Doppler, Color Doppler and Intracardiac            Opacification Agent Indications:    NSTEMI  History:        Patient has prior history of Echocardiogram examinations, most                 recent 03/23/2021. Signs/Symptoms:Altered  Mental Status and                 Alzheimer's; Risk Factors:Hypertension and Dyslipidemia. Covid                 19 positive.  Sonographer:    Roosvelt Maser RDCS Referring Phys: [phone] JARED M GARDNER IMPRESSIONS  1. Apex not well visualized Septal apical and anterior wall hypokinesis . Left ventricular ejection fraction, by estimation, is 30 to 35%. The left ventricle has moderately decreased function.  The left ventricle demonstrates regional wall motion abnormalities (see scoring diagram/findings for description). The left ventricular internal cavity size was moderately dilated. Left ventricular diastolic parameters were normal.  2. Right ventricular systolic function is normal. The right ventricular size is normal. There is normal pulmonary artery systolic pressure.  3. Right atrial size was mildly dilated.  4. The mitral valve is abnormal. Mild mitral valve regurgitation. No evidence of mitral stenosis.  5. The aortic valve is tricuspid. There is mild calcification of the aortic valve. There is mild thickening of the aortic valve. Aortic valve regurgitation is mild. Aortic valve sclerosis/calcification is present, without any evidence of aortic stenosis.  6. Aortic dilatation noted. There is mild dilatation of the ascending aorta, measuring 40 mm.  7. The inferior vena cava is normal in size with greater than 50% respiratory variability, suggesting right atrial pressure of 3 mmHg. FINDINGS  Left Ventricle: Apex not well visualized Septal apical and anterior wall hypokinesis. Left ventricular ejection fraction, by estimation, is 30 to 35%. The left ventricle has moderately decreased function. The left ventricle demonstrates regional wall motion abnormalities. The left ventricular internal cavity size was moderately dilated. There is no left ventricular hypertrophy. Left ventricular diastolic parameters were normal. Right Ventricle: The right ventricular size is normal. No increase in right ventricular wall thickness.  Right ventricular systolic function is normal. There is normal pulmonary artery systolic pressure. The tricuspid regurgitant velocity is 2.24 m/s, and  with an assumed right atrial pressure of 3 mmHg, the estimated right ventricular systolic pressure is 23.1 mmHg. Left Atrium: Left atrial size was normal in size. Right Atrium: Right atrial size was mildly dilated. Pericardium: There is no evidence of pericardial effusion. Mitral Valve: The mitral valve is abnormal. There is mild thickening of the mitral valve leaflet(s). Mild mitral valve regurgitation. No evidence of mitral valve stenosis. Tricuspid Valve: The tricuspid valve is normal in structure. Tricuspid valve regurgitation is not demonstrated. No evidence of tricuspid stenosis. Aortic Valve: The aortic valve is tricuspid. There is mild calcification of the aortic valve. There is mild thickening of the aortic valve. Aortic valve regurgitation is mild. Aortic valve sclerosis/calcification is present, without any evidence of aortic stenosis. Pulmonic Valve: The pulmonic valve was normal in structure. Pulmonic valve regurgitation is not visualized. No evidence of pulmonic stenosis. Aorta: Aortic dilatation noted. There is mild dilatation of the ascending aorta, measuring 40 mm. Venous: The inferior vena cava is normal in size with greater than 50% respiratory variability, suggesting right atrial pressure of 3 mmHg. IAS/Shunts: No atrial level shunt detected by color flow Doppler.  LEFT VENTRICLE PLAX 2D LVIDd:         4.40 cm LVIDs:         3.70 cm LV PW:         0.80 cm LV IVS:        1.00 cm LVOT diam:     2.00 cm LV SV:         47 LV SV Index:   25 LVOT Area:     3.14 cm  LV Volumes (MOD) LV vol d, MOD A2C: 47.9 ml LV vol d, MOD A4C: 79.6 ml LV vol s, MOD A2C: 27.4 ml LV vol s, MOD A4C: 54.6 ml LV SV MOD A2C:     20.5 ml LV SV MOD A4C:     79.6 ml LV SV MOD BP:      24.2 ml RIGHT VENTRICLE RV Basal diam:  4.20 cm RV Mid  diam:    2.50 cm LEFT ATRIUM               Index        RIGHT ATRIUM           Index LA diam:        2.20 cm  1.17 cm/m   RA Area:     25.00 cm LA Vol (A2C):   104.0 ml 55.38 ml/m  RA Volume:   88.70 ml  47.24 ml/m LA Vol (A4C):   103.0 ml 54.85 ml/m LA Biplane Vol: 104.0 ml 55.38 ml/m  AORTIC VALVE LVOT Vmax:   84.90 cm/s LVOT Vmean:  60.100 cm/s LVOT VTI:    0.149 m  AORTA Ao Root diam: 3.50 cm Ao Asc diam:  4.00 cm TRICUSPID VALVE TR Peak grad:   20.1 mmHg TR Vmax:        224.00 cm/s  SHUNTS Systemic VTI:  0.15 m Systemic Diam: 2.00 cm Charlton Haws MD Electronically signed by Charlton Haws MD Signature Date/Time: 09/04/2021/10:32:44 AM    Final       LOS: 1 day    Joycelyn Das, MD [facility] 09/04/2021, 1:02 PM

## 2021-09-04 NOTE — Hospital Course (Addendum)
Gerald Leach is a 88 y.o. male with medical history significant of Parkinson's disease with dementia, seizure disorder on antiepileptics, hypertension presented to hospital with worsening mental status for a week or two and was minimally verbal for 1 week.  Patient had sudden onset of severe altered mental status at home and EMS was called in.  EMS noted that the patient was in ventricular tachycardia and cardioverted and had the EKG pattern of ST elevation MI.  Patient was then brought into the hospital as a code STEMI.  In the ED, cardiology was consulted and due to his frail status with advanced dementia conservative treatment was pursued.  Patient was a started on amiodarone and heparin drip and was admitted to the hospital for further evaluation and treatment.  During hospitalization, palliative care was consulted and after having goals of care discussion, family initially elected comfort measures.  Since that time, his overall condition/mental status appears to have improved.  Oral intake also improving.  He does not appear to be candidate for residential hospice.  Plans are to discharge to skilled nursing facility for rehab with hospice/palliative care to follow.  His cardiac meds will be restarted for now. ?

## 2021-09-04 NOTE — Assessment & Plan Note (Signed)
-  Continue Lipitor °

## 2021-09-04 NOTE — Assessment & Plan Note (Addendum)
Patient has history of atrial fibrillation but not on anticoagulation as outpatient.  Currently normal sinus rhythm.  Unable to use beta-blocker due to bradycardia. ?

## 2021-09-05 ENCOUNTER — Other Ambulatory Visit: Payer: Self-pay

## 2021-09-05 DIAGNOSIS — E876 Hypokalemia: Secondary | ICD-10-CM | POA: Diagnosis present

## 2021-09-05 DIAGNOSIS — G20 Parkinson's disease: Secondary | ICD-10-CM | POA: Diagnosis not present

## 2021-09-05 DIAGNOSIS — I2109 ST elevation (STEMI) myocardial infarction involving other coronary artery of anterior wall: Secondary | ICD-10-CM | POA: Diagnosis not present

## 2021-09-05 DIAGNOSIS — I480 Paroxysmal atrial fibrillation: Secondary | ICD-10-CM | POA: Diagnosis not present

## 2021-09-05 DIAGNOSIS — U071 COVID-19: Secondary | ICD-10-CM | POA: Diagnosis not present

## 2021-09-05 DIAGNOSIS — E119 Type 2 diabetes mellitus without complications: Secondary | ICD-10-CM | POA: Diagnosis not present

## 2021-09-05 DIAGNOSIS — E785 Hyperlipidemia, unspecified: Secondary | ICD-10-CM | POA: Diagnosis not present

## 2021-09-05 DIAGNOSIS — I213 ST elevation (STEMI) myocardial infarction of unspecified site: Secondary | ICD-10-CM | POA: Diagnosis not present

## 2021-09-05 DIAGNOSIS — Z7189 Other specified counseling: Secondary | ICD-10-CM

## 2021-09-05 LAB — BASIC METABOLIC PANEL
Anion gap: 11 (ref 5–15)
BUN: 18 mg/dL (ref 8–23)
CO2: 23 mmol/L (ref 22–32)
Calcium: 8.3 mg/dL — ABNORMAL LOW (ref 8.9–10.3)
Chloride: 104 mmol/L (ref 98–111)
Creatinine, Ser: 0.99 mg/dL (ref 0.61–1.24)
GFR, Estimated: >60 mL/min (ref >60)
Glucose, Bld: 134 mg/dL — ABNORMAL HIGH (ref 70–99)
Potassium: 3.4 mmol/L — ABNORMAL LOW (ref 3.5–5.1)
Sodium: 138 mmol/L (ref 135–145)

## 2021-09-05 LAB — MAGNESIUM: Magnesium: 1.4 mg/dL — ABNORMAL LOW (ref 1.7–2.4)

## 2021-09-05 LAB — CBC
HCT: 38.9 % — ABNORMAL LOW (ref 39.0–52.0)
Hemoglobin: 12.5 g/dL — ABNORMAL LOW (ref 13.0–17.0)
MCH: 28.2 pg (ref 26.0–34.0)
MCHC: 32.1 g/dL (ref 30.0–36.0)
MCV: 87.6 fL (ref 80.0–100.0)
Platelets: 292 10*3/uL (ref 150–400)
RBC: 4.44 MIL/uL (ref 4.22–5.81)
RDW: 13.9 % (ref 11.5–15.5)
WBC: 9.3 10*3/uL (ref 4.0–10.5)
nRBC: 0 % (ref 0.0–0.2)

## 2021-09-05 LAB — GLUCOSE, CAPILLARY
Glucose-Capillary: 133 mg/dL — ABNORMAL HIGH (ref 70–99)
Glucose-Capillary: 116 mg/dL — ABNORMAL HIGH (ref 70–99)
Glucose-Capillary: 129 mg/dL — ABNORMAL HIGH (ref 70–99)

## 2021-09-05 MED ORDER — SODIUM CHLORIDE 0.9 % IV SOLN: INTRAVENOUS | Status: AC

## 2021-09-05 MED ORDER — LORAZEPAM 2 MG/ML PO CONC: 1.0000 mg | ORAL | Status: DC | PRN

## 2021-09-05 MED ORDER — MORPHINE SULFATE (CONCENTRATE) 10 MG/0.5ML PO SOLN
5.0000 mg | ORAL | Status: DC | PRN
  Administered 2021-09-06 – 2021-09-14 (×4): 5 mg via ORAL
  Filled 2021-09-05 (×5): qty 0.5

## 2021-09-05 MED ORDER — POTASSIUM CHLORIDE 20 MEQ PO PACK
40.0000 meq | PACK | Freq: Once | ORAL | Status: AC
  Administered 2021-09-05: 40 meq via ORAL
  Filled 2021-09-05: qty 2

## 2021-09-05 MED ORDER — MORPHINE SULFATE (PF) 2 MG/ML IV SOLN
2.0000 mg | INTRAVENOUS | Status: DC | PRN
  Administered 2021-09-06: 2 mg via INTRAVENOUS
  Filled 2021-09-05 (×3): qty 1

## 2021-09-05 MED ORDER — MORPHINE SULFATE (CONCENTRATE) 10 MG/0.5ML PO SOLN
5.0000 mg | ORAL | Status: DC | PRN
  Filled 2021-09-05: qty 0.5

## 2021-09-05 MED ORDER — AMIODARONE HCL 200 MG PO TABS
200.0000 mg | ORAL_TABLET | Freq: Two times a day (BID) | ORAL | Status: DC
  Administered 2021-09-05: 200 mg via ORAL
  Filled 2021-09-05: qty 1

## 2021-09-05 MED ORDER — GLYCOPYRROLATE 0.2 MG/ML IJ SOLN: 0.2000 mg | INTRAMUSCULAR | Status: DC | PRN

## 2021-09-05 MED ORDER — AMIODARONE HCL 200 MG PO TABS: 200.0000 mg | ORAL_TABLET | Freq: Every day | ORAL | Status: DC

## 2021-09-05 MED ORDER — LORAZEPAM 2 MG/ML IJ SOLN
1.0000 mg | INTRAMUSCULAR | Status: DC | PRN
  Administered 2021-09-07: 1 mg via INTRAVENOUS
  Filled 2021-09-05: qty 1

## 2021-09-05 MED ORDER — ENOXAPARIN SODIUM 60 MG/0.6ML IJ SOSY
60.0000 mg | PREFILLED_SYRINGE | Freq: Two times a day (BID) | INTRAMUSCULAR | Status: DC
  Administered 2021-09-05: 60 mg via SUBCUTANEOUS
  Filled 2021-09-05: qty 0.6

## 2021-09-05 MED ORDER — LORAZEPAM 1 MG PO TABS
1.0000 mg | ORAL_TABLET | ORAL | Status: DC | PRN
  Administered 2021-09-06 – 2021-09-14 (×7): 1 mg via ORAL
  Filled 2021-09-05 (×7): qty 1

## 2021-09-05 MED ORDER — POLYVINYL ALCOHOL 1.4 % OP SOLN
1.0000 [drp] | Freq: Four times a day (QID) | OPHTHALMIC | Status: DC | PRN
  Filled 2021-09-05: qty 15

## 2021-09-05 MED ORDER — BIOTENE DRY MOUTH MT LIQD: 15.0000 mL | OROMUCOSAL | Status: DC | PRN

## 2021-09-05 MED ORDER — GLYCOPYRROLATE 1 MG PO TABS
1.0000 mg | ORAL_TABLET | ORAL | Status: DC | PRN
Start: 2021-09-05 — End: 2021-09-15
  Filled 2021-09-05: qty 1

## 2021-09-05 MED ORDER — BISACODYL 10 MG RE SUPP: 10.0000 mg | Freq: Every day | RECTAL | Status: DC | PRN

## 2021-09-05 MED ORDER — CLOPIDOGREL BISULFATE 75 MG PO TABS
75.0000 mg | ORAL_TABLET | Freq: Every day | ORAL | Status: DC
  Administered 2021-09-05: 75 mg via ORAL
  Filled 2021-09-05: qty 1

## 2021-09-05 MED ORDER — MAGNESIUM SULFATE 4 GM/100ML IV SOLN
4.0000 g | Freq: Once | INTRAVENOUS | Status: AC
  Administered 2021-09-05: 4 g via INTRAVENOUS
  Filled 2021-09-05: qty 100

## 2021-09-05 NOTE — Progress Notes (Signed)
?PROGRESS NOTE ? ? ? Gerald Leach  XKG:[id number] DOB: October 16, 1933 DOA: 09/03/2021 ?PCP: [name], NP  ? ? ?Brief Narrative:  ?Gerald Leach is a 88 y.o. male with medical history significant of Parkinson's disease with dementia, seizure disorder on antiepileptics, hypertension presented to hospital with worsening mental status for a week or two and was minimally verbal for 1 week.  Patient had sudden onset of severe altered mental status at home and EMS was called in.  EMS noted that the patient was in ventricular tachycardia and cardioverted and had the EKG pattern of ST elevation MI.  Patient was then brought into the hospital as a code STEMI.  In the ED, cardiology was consulted and due to his frail status with advanced dementia conservative treatment was pursued.  Patient was a started on amiodarone and heparin drip and was admitted to the hospital for further evaluation and treatment.  ? ?  ?Assessment and Plan: ?* STEMI (ST elevation myocardial infarction) ([place]) ? STEMI with monomorphic VT in field.  Patient was cardioverted x1 at the field site.  Seen by cardiology here and due to frailty and dementia not a candidate for left heart catheterization.  Patient is DNR. Continue aspirin, Plavix and high intensity Lipitor, low-dose beta-blocker as tolerated,  nitroglycerin and amiodarone at this time.  Was on a heparin drip which was changed to Lovenox for total of 72 hours..  2D echocardiogram with decreased LV function at 30 to 35% with septal apical and anterior wall hypokinesis.  Follow cardiology recommendation. ? ?Ventricular tachycardia ?Likely secondary to ST elevation MI.  Cardioverted x1.  Patient was initiated on amiodarone drip which has been changed to oral amiodarone at this time.  Plan is to continue 200 mg twice daily for 1 week then 200 mg daily. ? ?COVID-19 virus infection ?Patient was having diarrhea and poor functioning at home likely secondary to COVID-19 infection.  Mild infection  without any hypoxia.  No need for antiretroviral treatment at this time. ? ?Dementia due to Parkinson'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 behavioral disturbance ([place]) ?Worsening mental status at home.  Continue supportive care.  On carbidopa Vimpat Keppra and amantadine at home.  We will continue while in the hospital.. ? ?Parkinson disease ([place]) ?Continue Sinemet and amantadine from home ? ?Diabetes mellitus type 2, controlled ([place]) ?Continue sliding scale insulin.  Hold metformin from home. ? ?Seizure disorder ([place]) ?Continue keppra and vimpat. ? ?Hypomagnesemia ?We will replenish through IV and orally.  Check levels in a.m. ? ?Hypokalemia ?We will continue to replenish.  Check levels in a.m. ? ?PAF (paroxysmal atrial fibrillation) ([place]) ?Patient has history of atrial fibrillation but not on anticoagulation as outpatient.  Currently normal sinus rhythm.  Unable to use beta-blocker due to bradycardia. ? ?Hyperlipidemia ?Continue Lipitor. ? ? ? DVT prophylaxis:   Lovenox therapeutic dose ? ? ?Code Status:   ?  Code Status: DNR ? ?Disposition: Home ?Status is: Inpatient ? ?Remains inpatient appropriate because: ST elevation MI, conservative treatment ? ? Family Communication:  ? ? ?Consultants:  ?Cardiology ? ?Procedures:  ?None ? ?Antimicrobials:  ?None ? ?Anti-infectives (From admission, onward)  ? ? None  ? ?  ? ? ?Subjective: ?Today, patient was seen and examined at bedside.  Patient is alert but confused and disoriented on mittens.  Poor historian.  Denies pain. ? ?Objective: ?Vitals:  ? 09/05/21 0032 09/05/21 0555 09/05/21 0802 09/05/21 1148  ?BP: 97/62 105/71 95/60 100/70  ?Pulse: 64 (!) 55 (!) 57 61  ?Resp: 18 18  14 17  ?Temp: 97.8 ?F (36.6 ?C) (!) 97.3 ?F (36.3 ?C) 97.6 ?F (36.4 ?C) 97.6 ?F (36.4 ?C)  ?TempSrc: Oral Oral Axillary Oral  ?SpO2: 100%  100% 100%  ?Weight:      ?Height:      ? ? ?Intake/Output Summary (Last 24 hours) at 09/05/2021 1157 ?Last data filed at 09/05/2021 1105 ?Gross per 24 hour  ?Intake 540.32 ml   ?Output --  ?Net 540.32 ml  ? ?Filed Weights  ? 09/04/21 0037 09/04/21 0612  ?Weight: 64.6 kg 67.4 kg  ? ?Body mass index is 20.15 kg/m?.  ? ?Physical Examination: ? ?General:  Average built, not in obvious distress, underlying dementia, on mittens ?HENT:   No scleral pallor or icterus noted. Oral mucosa is moist.  ?Chest:    Diminished breath sounds bilaterally. No crackles or wheezes.  ?CVS: S1 &S2 heard. No murmur.  Regular rate and rhythm. ?Abdomen: Soft, nontender, nondistended.  Bowel sounds are heard.   ?Extremities: No cyanosis, clubbing or edema.  Peripheral pulses are palpable.  On mittens. ?Psych: Alert, but disoriented. ?CNS: Alert but disoriented, underlying dementia, moves all extremities ?Skin: Warm and dry.  No rashes noted. ? ? ?Data Reviewed:  ? ?CBC: ?Recent Labs  ?Lab 09/03/21 ?2055 09/04/21 ?0216 09/05/21 ?6629  ?WBC 12.3* 12.0* 9.3  ?NEUTROABS 8.8*  --   --   ?HGB 16.0 14.9 12.5*  ?HCT 50.0 45.0 38.9*  ?MCV 89.6 87.2 87.6  ?PLT 357 317 292  ? ? ?Basic Metabolic Panel: ?Recent Labs  ?Lab 09/03/21 ?2055 09/04/21 ?0216 09/05/21 ?4765  ?NA 141 138 138  ?K 4.0 4.4 3.4*  ?CL 102 102 104  ?CO2 17* 17* 23  ?GLUCOSE 167* 104* 134*  ?BUN '18 23 18  '$ ?CREATININE 1.36* 1.21 0.99  ?CALCIUM 9.1 8.6* 8.3*  ?MG  --   --  1.4*  ? ? ?Liver Function Tests: ?Recent Labs  ?Lab 09/03/21 ?2055  ?AST 32  ?ALT 12  ?ALKPHOS 103  ?BILITOT 1.6*  ?PROT 7.5  ?ALBUMIN 3.5  ? ? ? ?Radiology Studies: ?DG Chest Port 1 View ? ?Result Date: 09/03/2021 ?CLINICAL DATA:  Chest pain. EXAM: PORTABLE CHEST 1 VIEW COMPARISON:  08/11/2020 FINDINGS: Mild cardiomegaly. Aortic atherosclerosis and tortuosity. No pulmonary edema. No focal airspace disease, pneumothorax, or significant pleural effusion. Remote lateral left rib fractures. Superior subluxation of the right humeral head. IMPRESSION: Mild cardiomegaly. Electronically Signed   By: Keith Rake M.D.   On: 09/03/2021 21:22  ? ?ECHOCARDIOGRAM COMPLETE ? ?Result Date: 09/04/2021 ?    ECHOCARDIOGRAM REPORT   Patient Name:   Gerald Leach Date of Exam: 09/04/2021 Medical Rec #:  [id number]      Height:       72.0 in Accession #:    [id number]     Weight:       148.6 lb Date of Birth:  29-Apr-1934       BSA:          1.878 m? Patient Age:    12 years       BP:           120/71 mmHg Patient Gender: M              HR:           64 bpm. Exam Location:  Inpatient Procedure: 2D Echo, Cardiac Doppler, Color Doppler and Intracardiac            Opacification Agent Indications:    NSTEMI  History:        Patient has prior history of Echocardiogram examinations, most                 recent 03/23/2021. Signs/Symptoms:Altered Mental Status and                 Alzheimer's; Risk Factors:Hypertension and Dyslipidemia. Covid                 19 positive.  Sonographer:    Merrie Roof RDCS Referring Phys: [place]  1. Apex not well visualized Septal apical and anterior wall hypokinesis . Left ventricular ejection fraction, by estimation, is 30 to 35%. The left ventricle has moderately decreased function. The left ventricle demonstrates regional wall motion abnormalities (see scoring diagram/findings for description). The left ventricular internal cavity size was moderately dilated. Left ventricular diastolic parameters were normal.  2. Right ventricular systolic function is normal. The right ventricular size is normal. There is normal pulmonary artery systolic pressure.  3. Right atrial size was mildly dilated.  4. The mitral valve is abnormal. Mild mitral valve regurgitation. No evidence of mitral stenosis.  5. The aortic valve is tricuspid. There is mild calcification of the aortic valve. There is mild thickening of the aortic valve. Aortic valve regurgitation is mild. Aortic valve sclerosis/calcification is present, without any evidence of aortic stenosis.  6. Aortic dilatation noted. There is mild dilatation of the ascending aorta, measuring 40 mm.  7. The inferior vena cava is normal in size  with greater than 50% respiratory variability, suggesting right atrial pressure of 3 mmHg. FINDINGS  Left Ventricle: Apex not well visualized Septal apical and anterior wall hypokinesis. Left ventricular ejection f

## 2021-09-05 NOTE — Consult Note (Signed)
? ?                                                                                ?Consultation Note ?Date: 09/05/2021  ? ?Patient Name: Gerald Leach  ?DOB: 04-Jul-1933  MRN: [id number]  Age / Sex: 88 y.o., male  ?PCP: [name], NP ?Referring Physician: Flora Lipps, MD ? ?Reason for Consultation: Establishing goals of care ? ?HPI/Patient Profile: 88 y.o. male  with past medical history of PD with dementia, seizure disorder on AEDs, atrial fibrillation, DM2, HLD, HTN admitted on 09/03/2021 with worsening mental status. ? ?Patient was found to have VT rhythm by EMS, cardioverted and then found STEMI pattern. COVID positive and overall failing to thrive. PMT has been consulted to assist with goals of car conversation. ? ?Clinical Assessment and Goals of Care: ? ?I have reviewed medical records including EPIC notes, labs and imaging, received report from RN, assessed the patient and then called patient's son to discuss diagnosis prognosis, GOC, EOL wishes, disposition and options. ? ?I introduced Palliative Medicine as specialized medical care for people living with serious illness. It focuses on providing relief from the symptoms and stress of a serious illness. The goal is to improve quality of life for both the patient and the family. ? ?We discussed a brief life review of the patient and then focused on their current illness. The natural disease trajectory and expectations at EOL were discussed. ? ?Medical History Review and Understanding: ?Patient's son has a good understanding of the irreversible medical conditions that are affecting patient's health and the severity of his illness. ? ?Advance Directives: ?A detailed discussion regarding advanced directives was had. [name] is unsure if patient has HCPOA documented, as this would be on paperwork from 20 years ago. ? ?Code Status: ?Concepts specific to code status, artifical feeding and hydration, and rehospitalization were considered and  discussed. ? ?Discussion: ?[name] describes his father's overall decline and tells me he is surprised he has lived so long with all of his comorbidities. His health became worse since a fall and brain injury 7-8 years ago. Patient's quality of life is poor due to immobility and his "loss of control." Patient has declined even more rapidly over the past 2.5 months and has eaten very little over the past 1-2 weeks. Patient's spouse died in July 20, 2022, although [name] is unsure if this has played a role in his decline. He tells me patient's daughters also agree this is something they all expected. ? ?The difference between aggressive medical intervention and comfort care was considered in light of the patient's goals of care. Hospice and Palliative Care services outpatient were explained and offered.  ? ?Discussed the importance of continued conversation with family and the medical providers regarding overall plan of care and treatment options, ensuring decisions are within the context of the patient?s values and GOCs.  ? ?Questions and concerns were addressed.  Hard Choices booklet left for review. The family was encouraged to call with questions or concerns.  PMT will continue to support holistically.  ? ?Patient's next of kin are his three adult children. No HCPOA on file. ?  ? ?SUMMARY OF RECOMMENDATIONS   ?-DNR confirmed ?-Patient's  son has decided for transition to comfort focused care ?-[facility] consulted for referral to [facility] residential hospice ?Morphine PRN for pain/air hunger/comfort ?Robinul PRN for excessive secretions ?Ativan PRN for agitation/anxiety ?Zofran PRN for nausea ?Liquifilm tears PRN for dry eyes ?May have comfort feeding ?Comfort cart for family ?Unrestricted visitations in the setting of EOL (per policy) ?Oxygen PRN 2L or less for comfort. No escalation.   ?-PMT will continue to support ? ?Prognosis:  ?< 2 weeks ? ?Discharge Planning: Hospice facility  ? ?  ? ?Primary Diagnoses: ?Present on  Admission: ? STEMI (ST elevation myocardial infarction) ([place]) ? Parkinson disease ([place]) ? Ventricular tachycardia ? Dementia due to Parkinson'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 behavioral disturbance ([place]) ? COVID-19 virus infection ? ? ?I have reviewed the medical record, interviewed the patient and family, and examined the patient. The following aspects are pertinent. ? ?Past Medical History:  ?Diagnosis Date  ? Brain aneurysm   ? Colonic polyp 2003  ? Dementia ([place])   ? Diabetes mellitus without complication ([place])   ? ED (erectile dysfunction)   ? GERD (gastroesophageal reflux disease)   ? Hyperlipidemia   ? Hypertension   ? Hypothyroidism   ? Seizures ([place])   ? per family  ? Seizures ([place])   ? ?Social History  ? ?Socioeconomic History  ? Marital status: Widowed  ?  Spouse name: Not on file  ? Number of children: 4  ? Years of education: 53  ? Highest education level: 12th grade  ?Occupational History  ? Occupation: retired  ?Tobacco Use  ? Smoking status: Never  ?  Passive exposure: Past  ? Smokeless tobacco: Never  ?Vaping Use  ? Vaping Use: Never used  ?Substance and Sexual Activity  ? Alcohol use: Yes  ?  Alcohol/week: 0.0 standard drinks  ?  Comment: rum mixed in diet coke 3 a week  ? Drug use: Yes  ?  Types: Nitrous oxide  ? Sexual activity: Not Currently  ?Other Topics Concern  ? Not on file  ?Social History Narrative  ? ** Merged History Encounter **  ?    ? Lives with wife in a 2 story home.  Has no trouble with the stairs as long as he holds on to the banister.  Education: college.  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    ? ?Social Determinants of Health  ? ?Financial Resource Strain: Low Risk   ? Difficulty of Paying Living Expenses: Not hard at all  ?Food Insecurity: No Food Insecurity  ? Worried About Charity fundraiser in the Last Year: Never true  ? Ran Out of Food in the Last Year: Never true  ?Transportation Needs: No Transportation Needs  ? Lack of Transportation (Medical): No  ? Lack  of Transportation (Non-Medical): No  ?Physical Activity: Insufficiently Active  ? Days of Exercise per Week: 5 days  ? Minutes of Exercise per Session: 20 min  ?Stress: No Stress Concern Present  ? Feeling of Stress : Not at all  ?Social Connections: Moderately Isolated  ? Frequency of Communication with Friends and Family: More than three times a week  ? Frequency of Social Gatherings with Friends and Family: More than three times a week  ? Attends Religious Services: Never  ? Active Member of Clubs or Organizations: No  ? Attends Archivist Meetings: Never  ? Marital Status: Married  ? ?Family History  ?Problem Relation Age of Onset  ? Liver disease Brother   ?  Died  ? Seizures Neg Hx   ? ?Scheduled Meds: ? amantadine  100 mg Oral QHS  ? amiodarone  200 mg Oral BID  ? Followed by  ? [START ON 09/19/2021] amiodarone  200 mg Oral Daily  ? aspirin EC  81 mg Oral Daily  ? atorvastatin  40 mg Oral Daily  ? carbidopa-levodopa  1 tablet Oral TID  ? clopidogrel  75 mg Oral Daily  ? enoxaparin (LOVENOX) injection  60 mg Subcutaneous BID  ? insulin aspart  0-9 Units Subcutaneous TID WC  ? lacosamide  150 mg Oral BID  ? levETIRAcetam  1,500 mg Oral BID  ? levothyroxine  75 mcg Oral Q0600  ? pantoprazole  40 mg Oral Daily  ? ?Continuous Infusions: ? sodium chloride 75 mL/hr at 09/05/21 1500  ? ?PRN Meds:.acetaminophen, morphine injection, nitroGLYCERIN, ondansetron (ZOFRAN) IV ?Medications Prior to Admission:  ?Prior to Admission medications   ?Medication Sig Start Date End Date Taking? Authorizing Provider  ?acetaminophen (TYLENOL) 325 MG tablet Take 2 tablets (650 mg total) by mouth every 6 (six) hours as needed for mild pain (or Fever >/= 101). 03/24/21  Yes Sheikh, Omair Latif, DO  ?amantadine (SYMMETREL) 100 MG capsule Take 1 capsule (100 mg total) by mouth daily. ?Patient taking differently: Take 100 mg by mouth at bedtime. 05/18/21  Yes Patel, Donika K, DO  ?carbidopa-levodopa (SINEMET IR) 25-100 MG tablet  TAKE 1 TABLET BY MOUTH THREE TIMES A DAY ?Patient taking differently: Take 1 tablet by mouth 3 (three) times daily. 07/04/21  Yes Patel, Arvin Collard K, DO  ?ferrous sulfate 325 (65 FE) MG tablet Take 325 mg by mouth daily with breakfa

## 2021-09-05 NOTE — Assessment & Plan Note (Addendum)
Was replenished through IV and orally.   ?

## 2021-09-05 NOTE — Progress Notes (Addendum)
? ?Progress Note ? ?Patient Name: Gerald Leach ?Date of Encounter: 09/05/2021 ? ?[facility] Cardiologist: None  ? ?Subjective  ? ?Laying in bed. Remains on O2 '@2L'$ . Offers no complaints.  ?I am not sure if you have truthfully was answering the question.  I asked several questions and did not hear intelligible answers.  He only mumbled a few words. ? ?Inpatient Medications  ?  ?Scheduled Meds: ? lacosamide  150 mg Oral BID  ? levETIRAcetam  1,500 mg Oral BID  ? ?Continuous Infusions: ? sodium chloride 75 mL/hr at 09/05/21 1500  ? ?PRN Meds: ?acetaminophen, antiseptic oral rinse, bisacodyl, glycopyrrolate **OR** glycopyrrolate **OR** glycopyrrolate, LORazepam **OR** LORazepam **OR** LORazepam, morphine injection, morphine CONCENTRATE **OR** morphine CONCENTRATE, nitroGLYCERIN, ondansetron (ZOFRAN) IV, polyvinyl alcohol  ? ?Vital Signs  ?  ?Vitals:  ? 09/05/21 0555 09/05/21 0802 09/05/21 1148 09/05/21 1628  ?BP: 105/71 95/60 100/70 109/67  ?Pulse: (!) 55 (!) 57 61 70  ?Resp: '18 14 17 12  '$ ?Temp: (!) 97.3 ?F (36.3 ?C) 97.6 ?F (36.4 ?C) 97.6 ?F (36.4 ?C)   ?TempSrc: Oral Axillary Oral   ?SpO2:  100% 100% 100%  ?Weight:      ?Height:      ? ? ?Intake/Output Summary (Last 24 hours) at 09/05/2021 1649 ?Last data filed at 09/05/2021 1500 ?Gross per 24 hour  ?Intake 1019.01 ml  ?Output --  ?Net 1019.01 ml  ? ?Last 3 Weights 09/04/2021 09/04/2021 08/28/2021  ?Weight (lbs) 148 lb 9.4 oz 142 lb 6.7 oz 145 lb  ?Weight (kg) 67.4 kg 64.6 kg 65.772 kg  ?Some encounter information is confidential and restricted. Go to Review Flowsheets activity to see all data.  ?   ? ?Telemetry  ?  ?SB with PACs - Personally Reviewed ? ?ECG  ?  ?No new tracing ? ?Physical Exam  ? ?GEN: No acute distress.  Resting in bed. ?Neck: No JVD ?Cardiac: RRR, normal S1 and S2.  No murmurs, rubs, cannot exclude soft S4 gallop.Marland Kitchen  ?Respiratory: Decreased respiratory effort, mild coarse breath sounds bilaterally--but very difficult to determine with contact  precautions stethoscope ?GI: Soft, nontender, non-distended  ?MS: No edema; No deformity. ?Neuro:  Alert to self but no oriented, wearing mitten restraints  ?Psych: Normal affect  ? ?Labs  ?  ?High Sensitivity Troponin:   ?Recent Labs  ?Lab 09/03/21 ?2055 09/04/21 ?0216  ?TROPONINIHS 0,923* 2,182*  ?   ?Chemistry ?Recent Labs  ?Lab 09/03/21 ?2055 09/04/21 ?0216 09/05/21 ?3007  ?NA 141 138 138  ?K 4.0 4.4 3.4*  ?CL 102 102 104  ?CO2 17* 17* 23  ?GLUCOSE 167* 104* 134*  ?BUN '18 23 18  '$ ?CREATININE 1.36* 1.21 0.99  ?CALCIUM 9.1 8.6* 8.3*  ?MG  --   --  1.4*  ?PROT 7.5  --   --   ?ALBUMIN 3.5  --   --   ?AST 32  --   --   ?ALT 12  --   --   ?ALKPHOS 103  --   --   ?BILITOT 1.6*  --   --   ?GFRNONAA 51* 59* >60  ?ANIONGAP 22* 19* 11  ?  ?Lipids  ?Recent Labs  ?Lab 09/03/21 ?2055  ?CHOL 177  ?TRIG 129  ?HDL 40*  ?LDLCALC 111*  ?CHOLHDL 4.4  ?  ?Hematology ?Recent Labs  ?Lab 09/03/21 ?2055 09/04/21 ?0216 09/05/21 ?6226  ?WBC 12.3* 12.0* 9.3  ?RBC 5.58 5.16 4.44  ?HGB 16.0 14.9 12.5*  ?HCT 50.0 45.0 38.9*  ?MCV 89.6  87.2 87.6  ?MCH 28.7 28.9 28.2  ?MCHC 32.0 33.1 32.1  ?RDW 13.7 13.7 13.9  ?PLT 357 317 292  ? ?Thyroid No results for input(s): TSH, FREET4 in the last 168 hours.  ?BNPNo results for input(s): BNP, PROBNP in the last 168 hours.  ?DDimer  ?Recent Labs  ?Lab 09/04/21 ?0216  ?DDIMER 1.26*  ?  ? ?Radiology  ?  ?DG Chest Port 1 View ? ?Result Date: 09/03/2021 ?CLINICAL DATA:  Chest pain. EXAM: PORTABLE CHEST 1 VIEW COMPARISON:  08/11/2020 FINDINGS: Mild cardiomegaly. Aortic atherosclerosis and tortuosity. No pulmonary edema. No focal airspace disease, pneumothorax, or significant pleural effusion. Remote lateral left rib fractures. Superior subluxation of the right humeral head. IMPRESSION: Mild cardiomegaly. Electronically Signed   By: Keith Rake M.D.   On: 09/03/2021 21:22  ? ?ECHOCARDIOGRAM COMPLETE ? ?Result Date: 09/04/2021 ?   ECHOCARDIOGRAM REPORT   Patient Name:   Gerald Leach Date of Exam: 09/04/2021  Medical Rec #:  [id number]      Height:       72.0 in Accession #:    [id number]     Weight:       148.6 lb Date of Birth:  [birth year]       BSA:          1.878 m? Patient Age:    88 years       BP:           120/71 mmHg Patient Gender: M              HR:           64 bpm. Exam Location:  Inpatient Procedure: 2D Echo, Cardiac Doppler, Color Doppler and Intracardiac            Opacification Agent Indications:    NSTEMI  History:        Patient has prior history of Echocardiogram examinations, most                 recent 03/23/2021. Signs/Symptoms:Altered Mental Status and                 Alzheimer's; Risk Factors:Hypertension and Dyslipidemia. Covid                 19 positive.  Sonographer:    Merrie Roof RDCS Referring Phys: [place]  1. Apex not well visualized Septal apical and anterior wall hypokinesis . Left ventricular ejection fraction, by estimation, is 30 to 35%. The left ventricle has moderately decreased function. The left ventricle demonstrates regional wall motion abnormalities (see scoring diagram/findings for description). The left ventricular internal cavity size was moderately dilated. Left ventricular diastolic parameters were normal.  2. Right ventricular systolic function is normal. The right ventricular size is normal. There is normal pulmonary artery systolic pressure.  3. Right atrial size was mildly dilated.  4. The mitral valve is abnormal. Mild mitral valve regurgitation. No evidence of mitral stenosis.  5. The aortic valve is tricuspid. There is mild calcification of the aortic valve. There is mild thickening of the aortic valve. Aortic valve regurgitation is mild. Aortic valve sclerosis/calcification is present, without any evidence of aortic stenosis.  6. Aortic dilatation noted. There is mild dilatation of the ascending aorta, measuring 40 mm.  7. The inferior vena cava is normal in size with greater than 50% respiratory variability, suggesting right atrial pressure  of 3 mmHg. FINDINGS  Left Ventricle: Apex not well visualized Septal apical and anterior  wall hypokinesis. Left ventricular ejection fraction, by estimation, is 30 to 35%. The left ventricle has moderately decreased function. The left ventricle demonstrates regional wall motion abnormalities. The left ventricular internal cavity size was moderately dilated. There is no left ventricular hypertrophy. Left ventricular diastolic parameters were normal. Right Ventricle: The right ventricular size is normal. No increase in right ventricular wall thickness. Right ventricular systolic function is normal. There is normal pulmonary artery systolic pressure. The tricuspid regurgitant velocity is 2.24 m/s, and  with an assumed right atrial pressure of 3 mmHg, the estimated right ventricular systolic pressure is 91.6 mmHg. Left Atrium: Left atrial size was normal in size. Right Atrium: Right atrial size was mildly dilated. Pericardium: There is no evidence of pericardial effusion. Mitral Valve: The mitral valve is abnormal. There is mild thickening of the mitral valve leaflet(s). Mild mitral valve regurgitation. No evidence of mitral valve stenosis. Tricuspid Valve: The tricuspid valve is normal in structure. Tricuspid valve regurgitation is not demonstrated. No evidence of tricuspid stenosis. Aortic Valve: The aortic valve is tricuspid. There is mild calcification of the aortic valve. There is mild thickening of the aortic valve. Aortic valve regurgitation is mild. Aortic valve sclerosis/calcification is present, without any evidence of aortic stenosis. Pulmonic Valve: The pulmonic valve was normal in structure. Pulmonic valve regurgitation is not visualized. No evidence of pulmonic stenosis. Aorta: Aortic dilatation noted. There is mild dilatation of the ascending aorta, measuring 40 mm. Venous: The inferior vena cava is normal in size with greater than 50% respiratory variability, suggesting right atrial pressure of 3 mmHg.  IAS/Shunts: No atrial level shunt detected by color flow Doppler.  LEFT VENTRICLE PLAX 2D LVIDd:         4.40 cm LVIDs:         3.70 cm LV PW:         0.80 cm LV IVS:        1.00 cm LVOT diam:     2.00 cm LV S

## 2021-09-05 NOTE — Consult Note (Signed)
? ?  [facility] CM Inpatient Consult ? ? ?09/05/2021 ? ?Justin Mend Boerema ?16-Jan-1934 ?[id number] ? ?[place] [facility] [ACO] Patient: Gerald Leach ? ?Primary Care Provider:  [name], NP  ?  is an embedded provider with a Chronic Care Management team and program, and is listed for the transition of care follow up and appointments. ? ?Patient was screened for Embedded practice service needs for chronic care management and found that patient has been active in the Chronic Care Management program with [place] and Embedded Pharmacist noted. ? ?Plan: Will send notification to the [facility] Management for updates and needs. ? ?Please contact for further questions, ? ?Natividad Brood, RN BSN CCM ?[facility] Liaison ? [phone] business mobile phone ?Toll free office [phone]  ?Fax number: [phone] ?[email or web address] ? ? ? ?

## 2021-09-05 NOTE — Assessment & Plan Note (Addendum)
Was replenished. ?

## 2021-09-06 DIAGNOSIS — I213 ST elevation (STEMI) myocardial infarction of unspecified site: Secondary | ICD-10-CM | POA: Diagnosis not present

## 2021-09-06 DIAGNOSIS — I2109 ST elevation (STEMI) myocardial infarction involving other coronary artery of anterior wall: Secondary | ICD-10-CM

## 2021-09-06 DIAGNOSIS — Z66 Do not resuscitate: Secondary | ICD-10-CM

## 2021-09-06 DIAGNOSIS — Z515 Encounter for palliative care: Secondary | ICD-10-CM

## 2021-09-06 DIAGNOSIS — E119 Type 2 diabetes mellitus without complications: Secondary | ICD-10-CM | POA: Diagnosis not present

## 2021-09-06 DIAGNOSIS — U071 COVID-19: Secondary | ICD-10-CM | POA: Diagnosis not present

## 2021-09-06 DIAGNOSIS — G20 Parkinson's disease: Secondary | ICD-10-CM | POA: Diagnosis not present

## 2021-09-06 MED ORDER — LEVETIRACETAM 100 MG/ML PO SOLN
1500.0000 mg | Freq: Two times a day (BID) | ORAL | Status: DC
  Administered 2021-09-06 – 2021-09-07 (×2): 1500 mg via ORAL
  Filled 2021-09-06 (×4): qty 15

## 2021-09-06 NOTE — Progress Notes (Signed)
[place] [phone] Manufacturing engineer [facility]) Hospital Liaison Note ? ?Received request for [facility], Ricki Miller, for family interest in [facility]. Spoke with son [name] to confirm interest and explain services.  ? ?Mr. Bring is Covid + and is on isolation through 3.28. [facility] is unable to accept patient until isolation has ended. [facility] will continue to follow and evaluate if appropriate for our inpatient unit on 3.28 with possible transfer on 3.29 if bed available, patient is eligible for [place] admission and patient is stable for transport. ? ?Family and Manuela Schwartz with [facility] are aware of above. ? ?Please call with any hospice related questions or concerns. ? ?Thank you for this referral. ? ?Margaretmary Eddy, BSN, RN ?[facility] Liaison ?[phone] ?

## 2021-09-06 NOTE — Assessment & Plan Note (Addendum)
Palliative care on board.   ?Goals of care discussion had with patient's family when his overall condition appears to be more critical.  He was less responsive at that time. ?Since that time, he is mentation has improved, he is interacting and oral intake improving.  Does not appear to be a candidate for residential hospice at this time ?Plans are for patient to go to skilled nursing facility for rehab with palliative/hospice to follow. ?

## 2021-09-06 NOTE — Care Management Important Message (Signed)
Important Message ? ?Patient Details  ?Name: Gerald Leach ?MRN: [id number] ?Date of Birth: [birth year] ? ? ?Medicare Important Message Given:  Yes ? ? ? ? ?Shelda Altes ?09/06/2021, 8:15 AM ?

## 2021-09-06 NOTE — Progress Notes (Addendum)
?                                                   ?Palliative Care Progress Note, Assessment & Plan  ? ?Patient Name: Gerald Leach       Date: 09/06/2021 ?DOB: 1933/06/21  Age: 88 y.o. MRN#: [id number] ?Attending Physician: Flora Lipps, MD ?Primary Care Physician: [name], NP ?Admit Date: 09/03/2021 ? ?Reason for Consultation/Follow-up: Establishing goals of care ? ?Subjective: ?Patient is sitting up in bed in no apparent distress.  He acknowledges my presence and is able to make his wishes known.  Patient care tech is at bedside preparing his lunch tray and assistance with feeding.  Patient has no acute complaints at this time.  No family at bedside. ? ?HPI: ?88 y.o. male  with past medical history of PD with dementia, seizure disorder on AEDs, atrial fibrillation, DM2, HLD, HTN admitted on 09/03/2021 with worsening mental status. ?  ?Patient was found to have VT rhythm by EMS, cardioverted and then found STEMI pattern. COVID positive and overall failing to thrive. PMT has been consulted to assist with goals of car conversation. ? ?Summary of counseling/coordination of care: ?After reviewing the patient's chart and assessing the patient at bedside, I spoke with patient's nurse regarding conversion of medications to sublingual.  According to nursing, patient has removed all peripheral IV access and continues to do so despite use of redirection and intermittent mitts. ? ?Upon review of MAR, Keppra was converted to oral solution.  All of the medications reviewed and routes other than IV are available. ? ?Also counseled with hospice liaison.  Hospice has been in contact with patient's son.  Hospice information sheet left at bedside for patient's sons review.  Hospice plans to evaluate patient when COVID restrictions have been lifted. ? ?I spoke with patient's son and gave  him update for today.  I also confirmed that patient is full comfort measures. Son is still in agreement to have patient evaluated for Hospice Inpatient Unit at conclusion of Covid restrictions.  ? ?Patient's and concerns were addressed.  Palliative medicine team will continue to follow patient throughout his hospitalization. ? ?Code Status: ?DNR ? ?Prognosis: ?< 6 weeks ? ?Discharge Planning: ?Hospice facility ? ?Recommendations/Plan: ?Convert all medications to oral solution or sublingual ?Continue comfort measures ?DNR remains ? ?Care plan was discussed with patient, patient's son [name], [place], RN ? ?Physical Exam ?Vitals and nursing note reviewed.  ?Constitutional:   ?   General: He is not in acute distress. ?   Appearance: Normal appearance. He is not toxic-appearing.  ?HENT:  ?   Head: Normocephalic and atraumatic.  ?   Mouth/Throat:  ?   Mouth: Mucous membranes are moist.  ?Eyes:  ?   Pupils: Pupils are equal, round, and reactive to light.  ?Cardiovascular:  ?   Rate and Rhythm: Normal rate.  ?   Pulses: Normal pulses.  ?Pulmonary:  ?   Effort: Pulmonary effort is normal.  ?Abdominal:  ?   Palpations: Abdomen is soft.  ?Musculoskeletal:  ?   Comments: Generalized weakness  ?Skin: ?   General: Skin is warm and dry.  ?Neurological:  ?   Mental Status: He is alert. Mental status is at baseline.  ?   Comments: Oriented to self  ?         ? ?  Palliative Assessment/Data: 30% ? ? ? ?Total Time 35 minutes  ?Greater than 50%  of this time was spent counseling and coordinating care related to the above assessment and plan. ? ?Thank you for allowing the Palliative Medicine Team to assist in the care of this patient. ? ?Verdell Carmine. Ivadell Gaul, DNP, FNP-BC ?Palliative Medicine Team ?Team Phone # [phone] ?  ?

## 2021-09-06 NOTE — TOC Progression Note (Addendum)
Transition of Care (TOC) - Progression Note  ? ? ?Patient Details  ?Name: Gerald Leach ?MRN: [id number] ?Date of Birth: 04-22-1934 ? ?Transition of Care (TOC) CM/SW Contact  ?Ninfa Meeker, RN ?Phone Number: ?09/06/2021, 10:52 AM ? ?Clinical Narrative:   Patient has been made comfort care, his son -Gerald Leach, informed Palliative team that he wants patient to go to [facility]. CM called referral to [facility], Roselee Nova, and she will contact the family. There are no available beds today at [facility].  ?11:16 AM- Olivia Mackie called and states that patient cant come until after COVID isolation ends 09/12/21, she will speak with [name].  ? ?Expected Discharge Plan: [place] ?Barriers to Discharge: Hospice Bed not available ? ?Expected Discharge Plan and Services ?Expected Discharge Plan: [place] ?  ?  ?Post Acute Care Choice: Hospice ?  ?                ?  ?  ?  ?  ?  ?  ?  ?  ?  ? ?  ? ? ?Social Determinants of Health (SDOH) Interventions ?  ? ?Readmission Risk Interventions ? ?  08/12/2020  ?  1:54 PM  ?Readmission Risk Prevention Plan  ?Post Dischage Appt Complete  ?Medication Screening Complete  ?Transportation Screening Complete  ? ? ? ?

## 2021-09-06 NOTE — Progress Notes (Signed)
Nutrition Brief Note ? ?Chart reviewed d/t MST score of 3. ?Per Palliative Care note patient is transitioning to comfort care.  ?Plans for discharge to Hospice facility. ?No nutrition interventions planned at this time.  ?Please consult as needed.  ? ? ?Lucas Mallow RD, LDN, CNSC ?Please refer to Amion for contact information.                                                       ? ? ?

## 2021-09-06 NOTE — Progress Notes (Addendum)
?PROGRESS NOTE ? ? ? Gerald Leach  KKX:[id number] DOB: [birth year] DOA: 09/03/2021 ?PCP: [name], NP  ? ? ?Brief Narrative:  ?Gerald Leach is a 88 y.o. male with medical history significant of Parkinson's disease with dementia, seizure disorder on antiepileptics, hypertension presented to hospital with worsening mental status for a week or two and was minimally verbal for 1 week.  Patient had sudden onset of severe altered mental status at home and EMS was called in.  EMS noted that the patient was in ventricular tachycardia and cardioverted and had the EKG pattern of ST elevation MI.  Patient was then brought into the hospital as a code STEMI.  In the ED, cardiology was consulted and due to his frail status with advanced dementia conservative treatment was pursued.  Patient was a started on amiodarone and heparin drip and was admitted to the hospital for further evaluation and treatment.  During hospitalization, palliative care was consulted and at this time family has decided to proceed with comfort care  ? ?  ?Assessment and Plan: ?Ventricular tachycardia ?Likely secondary to ST elevation MI.  Cardioverted x1.  Patient was initiated on amiodarone drip which was changed to oral amiodarone but currently transitioning to comfort care ? ?Acute ST elevation myocardial infarction (STEMI) of anterior wall (HCC) ? STEMI with monomorphic VT in field.  Patient was cardioverted x1 at the field site.  Seen by cardiology here and due to frailty and dementia not a candidate for left heart catheterization.  Patient is DNR.  Patient was on aspirin, Plavix and high intensity Lipitor, low-dose beta-blocker as tolerated,  nitroglycerin and amiodarone .   Was on a heparin drip which was changed to Lovenox. 2D echocardiogram with decreased LV function at 30 to 35% with septal apical and anterior wall hypokinesis.  At this time, patient is transitioning to comfort care ? ?COVID-19 virus infection ?Patient was having diarrhea  and poor functioning at home likely secondary to COVID-19 infection.  Mild infection without any hypoxia.  Currently on comfort care ? ?Dementia due to Parkinson'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 behavioral disturbance ([place]) ?Worsening mental status at home.  Patient was on carbidopa Vimpat Keppra and amantadine at home.  Currently has been transitioned to comfort care ? ?Parkinson disease ([place]) ?Patient was on Sinemet and amantadine from home.  Currently on Vimpat and Keppra. ? ?Diabetes mellitus type 2, controlled ([place]) ?On comfort care..  Hold metformin from home. ? ?Seizure disorder ([place]) ?Continue keppra and vimpat. ? ?Goals of care, counseling/discussion ?Palliative care on board.  Patient has been transitioned to comfort care at this time.  Patient will be transition to residential hospice and [facility] has been consulted. ? ?Hypomagnesemia ?Was replenished through IV and orally.  Transitioning to comfort care at this time ? ?Hypokalemia ?Was replenished. ? ?PAF (paroxysmal atrial fibrillation) ([place]) ?Patient has history of atrial fibrillation but not on anticoagulation as outpatient.  Currently normal sinus rhythm.  Unable to use beta-blocker due to bradycardia. ? ?Hyperlipidemia with target LDL less than 70 ?Continue Lipitor. ? ? ? DVT prophylaxis:   None for comfort ? ? ?Code Status:   ?  Code Status: DNR ? ?Disposition: Residential hospice ? ?Status is: Inpatient ? ?Remains inpatient appropriate because: ST elevation MI, conservative treatment, comfort care ? ? Family Communication:  ?I spoke with the patient's son on the phone and updated him about the clinical condition of the patient. ? ?Consultants:  ?Cardiology ?Palliative care ? ?Procedures:  ?None ? ?Antimicrobials:  ?None ? ?Anti-infectives (From  admission, onward)  ? ? None  ? ?  ? ? ?Subjective: ?Today, patient was seen and examined at bedside.  Seen at the bedside.  Alert awake and communicative.  Mildly agitated.  Confused.  Denies chest  pain. ? ?Objective: ?Vitals:  ? 09/05/21 0802 09/05/21 1148 09/05/21 1628 09/05/21 2100  ?BP: 95/60 100/70 109/67 111/69  ?Pulse: (!) 57 61 70   ?Resp: '14 17 12 18  '$ ?Temp: 97.6 ?F (36.4 ?C) 97.6 ?F (36.4 ?C)    ?TempSrc: Axillary Oral    ?SpO2: 100% 100% 100%   ?Weight:      ?Height:      ? ? ?Intake/Output Summary (Last 24 hours) at 09/06/2021 0945 ?Last data filed at 09/06/2021 0600 ?Gross per 24 hour  ?Intake 1400.62 ml  ?Output 75 ml  ?Net 1325.62 ml  ? ? ?Filed Weights  ? 09/04/21 0037 09/04/21 0612  ?Weight: 64.6 kg 67.4 kg  ? ?Body mass index is 20.15 kg/m?.  ? ?Physical Examination: ? ?General:  Average built, not in obvious distress but mildly anxious, has underlying dementia,  ?HENT:   No scleral pallor or icterus noted. Oral mucosa is moist.  ?Chest:    Diminished breath sounds bilaterally. No crackles or wheezes.  ?CVS: S1 &S2 heard. No murmur.  Regular rate and rhythm. ?Abdomen: Soft, nontender, nondistended.  Bowel sounds are heard.   ?Extremities: No cyanosis, clubbing or edema.  Peripheral pulses are palpable. ?Psych: Alert, awake and communicative, has dementia, disoriented, ?CNS:  No cranial nerve deficits.  Moves all extremities. ?Skin: Warm and dry.  No rashes noted. ? ? ? ?Data Reviewed:  ? ?CBC: ?Recent Labs  ?Lab 09/03/21 ?2055 09/04/21 ?0216 09/05/21 ?3810  ?WBC 12.3* 12.0* 9.3  ?NEUTROABS 8.8*  --   --   ?HGB 16.0 14.9 12.5*  ?HCT 50.0 45.0 38.9*  ?MCV 89.6 87.2 87.6  ?PLT 357 317 292  ? ? ? ?Basic Metabolic Panel: ?Recent Labs  ?Lab 09/03/21 ?2055 09/04/21 ?0216 09/05/21 ?1751  ?NA 141 138 138  ?K 4.0 4.4 3.4*  ?CL 102 102 104  ?CO2 17* 17* 23  ?GLUCOSE 167* 104* 134*  ?BUN '18 23 18  '$ ?CREATININE 1.36* 1.21 0.99  ?CALCIUM 9.1 8.6* 8.3*  ?MG  --   --  1.4*  ? ? ? ?Liver Function Tests: ?Recent Labs  ?Lab 09/03/21 ?2055  ?AST 32  ?ALT 12  ?ALKPHOS 103  ?BILITOT 1.6*  ?PROT 7.5  ?ALBUMIN 3.5  ? ? ? ? ?Radiology Studies: ?ECHOCARDIOGRAM COMPLETE ? ?Result Date: 09/04/2021 ?   ECHOCARDIOGRAM REPORT    Patient Name:   Gerald Leach Date of Exam: 09/04/2021 Medical Rec #:  [id number]      Height:       72.0 in Accession #:    [id number]     Weight:       148.6 lb Date of Birth:  04/21/1934       BSA:          1.878 m? Patient Age:    55 years       BP:           120/71 mmHg Patient Gender: M              HR:           64 bpm. Exam Location:  Inpatient Procedure: 2D Echo, Cardiac Doppler, Color Doppler and Intracardiac            Opacification Agent Indications:  NSTEMI  History:        Patient has prior history of Echocardiogram examinations, most                 recent 03/23/2021. Signs/Symptoms:Altered Mental Status and                 Alzheimer's; Risk Factors:Hypertension and Dyslipidemia. Covid                 19 positive.  Sonographer:    Merrie Roof RDCS Referring Phys: [place]  1. Apex not well visualized Septal apical and anterior wall hypokinesis . Left ventricular ejection fraction, by estimation, is 30 to 35%. The left ventricle has moderately decreased function. The left ventricle demonstrates regional wall motion abnormalities (see scoring diagram/findings for description). The left ventricular internal cavity size was moderately dilated. Left ventricular diastolic parameters were normal.  2. Right ventricular systolic function is normal. The right ventricular size is normal. There is normal pulmonary artery systolic pressure.  3. Right atrial size was mildly dilated.  4. The mitral valve is abnormal. Mild mitral valve regurgitation. No evidence of mitral stenosis.  5. The aortic valve is tricuspid. There is mild calcification of the aortic valve. There is mild thickening of the aortic valve. Aortic valve regurgitation is mild. Aortic valve sclerosis/calcification is present, without any evidence of aortic stenosis.  6. Aortic dilatation noted. There is mild dilatation of the ascending aorta, measuring 40 mm.  7. The inferior vena cava is normal in size with greater than 50%  respiratory variability, suggesting right atrial pressure of 3 mmHg. FINDINGS  Left Ventricle: Apex not well visualized Septal apical and anterior wall hypokinesis. Left ventricular ejection fraction, by estimation, is 30 to 35%.

## 2021-09-06 NOTE — Progress Notes (Signed)
Wasted '5mg'$ /2.62m Morphine oral suspension, witnessed by BWallene Dales RN.  ?

## 2021-09-07 DIAGNOSIS — E119 Type 2 diabetes mellitus without complications: Secondary | ICD-10-CM | POA: Diagnosis not present

## 2021-09-07 DIAGNOSIS — I213 ST elevation (STEMI) myocardial infarction of unspecified site: Secondary | ICD-10-CM | POA: Diagnosis not present

## 2021-09-07 DIAGNOSIS — Z7189 Other specified counseling: Secondary | ICD-10-CM | POA: Diagnosis not present

## 2021-09-07 DIAGNOSIS — U071 COVID-19: Secondary | ICD-10-CM | POA: Diagnosis not present

## 2021-09-07 DIAGNOSIS — G20 Parkinson's disease: Secondary | ICD-10-CM | POA: Diagnosis not present

## 2021-09-07 MED ORDER — LEVETIRACETAM IN NACL 1500 MG/100ML IV SOLN: 1500.0000 mg | Freq: Two times a day (BID) | INTRAVENOUS | Status: DC

## 2021-09-07 MED ORDER — LEVETIRACETAM IN NACL 1500 MG/100ML IV SOLN
1500.0000 mg | Freq: Two times a day (BID) | INTRAVENOUS | Status: DC
  Administered 2021-09-07 – 2021-09-10 (×6): 1500 mg via INTRAVENOUS
  Filled 2021-09-07 (×8): qty 100

## 2021-09-07 NOTE — Progress Notes (Signed)
[facility]) Hospital Liaison Note ? ?[place] liaison continuing to follow Mr. Huffaker who has been referred for [facility]. We will evaluate patient closer to the end of his isolation period. ? ?Phone call with son, [name], to discuss eligibility criteria for IPU of 2 weeks or less. Discussion ensued that if patient is not eligible for IPU, hospice services could be offered in another setting. Discharge disposition still to be determined. Encouraged [name] to reach out to [facility] to discuss discharge planning options should patient not be eligible for [facility]. ? ?Please call with any hospice related questions or concerns. ? ?Thank you, ?Margaretmary Eddy, BSN, RN ?[facility] Liaison ?[phone] ?

## 2021-09-07 NOTE — Progress Notes (Signed)
Initiated use of telesitter at beginning of shift per discussion with son at bedside. After discussion with physician, felt telesitter might not be necessary since PRN meds and other safety measures (I.e. posey belt alarm) were being used. Re-discussed with son; agreed ok to discontinue telesitter. Patient given PRN pain med before sleep. Rested comfortably throughout the night. Nurse rounded on patient and utilizing continuous pulse ox (monitor screen on comfort care privacy mode) to monitor patient status.  ?

## 2021-09-07 NOTE — Progress Notes (Signed)
Progress note ? ?PE:  ?Patient does not acknowledge my presence.  He is not able to make his needs known.  His breathing is even and unlabored.  Patient is having no apparent distress.  No family at bedside. ? ?Plan: ?After reviewing the patient's chart and assessing the patient at bedside, I met with patient's nurse who was concerned about his lethargy.  Discussed with nurse that patient has reached end-of-life and is transitioned to full comfort measures.  Full comfort measures described in detail.  Nurse concerned that patient will not need hospice evaluation at end of [place] restrictions on 3/28.  Discussed with nurse that we will continue to monitor the patient and if he is medically stable on 328 then we will still have him evaluated.  Until then, comfort measures will be continued.  Medications reviewed and discussed use of as needed's for air hunger, anxiety, agitation, and nausea and vomiting. ? ?After my interaction with the patient, clinical social worker notified that other hospice inpatient units are able to accept patient despite his COVID-positive status. Social worker to discuss with patient's son. ? ?Palliative medicine team will continue to follow the patient throughout his hospitalization. ? ?Verdell Carmine. Dazaria Macneill, DNP, FNP-BC ?Palliative Medicine Team ?Team Phone # [phone] ? ?Greater than 50% of this time was spent counseling and coordinating care related to the above assessment and plan.  ? ? ? ?

## 2021-09-07 NOTE — TOC Progression Note (Addendum)
Transition of Care (TOC) - Progression Note  ? ? ?Patient Details  ?Name: Gerald Leach ?MRN: [id number] ?Date of Birth: May 19, 1934 ? ?Transition of Care (TOC) CM/SW Contact  ?Milas Gain, LCSWA ?Phone Number: ?09/07/2021, 4:05 PM ? ?Clinical Narrative:    ? ?CSW continues to follow patient. Patient was referred to [facility].Olivia Mackie with [facility] reports they are following patient and will evaluate patient closer to the end of patients isolation period.CSW will continue to follow and assist with patients dc planning needs. ? ?Expected Discharge Plan: [place] ?Barriers to Discharge: Hospice Bed not available ? ?Expected Discharge Plan and Services ?Expected Discharge Plan: [place] ?  ?  ?Post Acute Care Choice: Hospice ?  ?                ?  ?  ?  ?  ?  ?  ?  ?  ?  ?  ? ? ?Social Determinants of Health (SDOH) Interventions ?  ? ?Readmission Risk Interventions ? ?  08/12/2020  ?  1:54 PM  ?Readmission Risk Prevention Plan  ?Post Dischage Appt Complete  ?Medication Screening Complete  ?Transportation Screening Complete  ? ? ?

## 2021-09-07 NOTE — Progress Notes (Signed)
?PROGRESS NOTE ? ? ? Gerald Leach  FYB:[id number] DOB: 17-Aug-1933 DOA: 09/03/2021 ?PCP: [name], NP  ? ? ?Brief Narrative:  ?Gerald Leach is a 88 y.o. male with medical history significant of Parkinson's disease with dementia, seizure disorder on antiepileptics, hypertension presented to hospital with worsening mental status for a week or two and was minimally verbal for 1 week.  Patient had sudden onset of severe altered mental status at home and EMS was called in.  EMS noted that the patient was in ventricular tachycardia and cardioverted and had the EKG pattern of ST elevation MI.  Patient was then brought into the hospital as a code STEMI.  In the ED, cardiology was consulted and due to his frail status with advanced dementia conservative treatment was pursued.  Patient was a started on amiodarone and heparin drip and was admitted to the hospital for further evaluation and treatment.  During hospitalization, palliative care was consulted and at this time family has decided to proceed with comfort care  ? ?  ?Assessment and Plan: ?Ventricular tachycardia ?Likely secondary to ST elevation MI.  Cardioverted x1.  Patient was initiated on amiodarone drip which was changed to oral amiodarone but currently transitioning to comfort care ? ?Acute ST elevation myocardial infarction (STEMI) of anterior wall (HCC) ? STEMI with monomorphic VT in field.  Patient was cardioverted x1 at the field site.  Seen by cardiology here and due to frailty and dementia not a candidate for left heart catheterization.  Patient is DNR.  Patient was on aspirin, Plavix and high intensity Lipitor, low-dose beta-blocker as tolerated,  nitroglycerin and amiodarone .   Was on a heparin drip which was changed to Lovenox. 2D echocardiogram with decreased LV function at 30 to 35% with septal apical and anterior wall hypokinesis.  At this time, patient is transitioning to comfort care ? ?COVID-19 virus infection ?Patient was having diarrhea  and poor functioning at home likely secondary to COVID-19 infection.  Mild infection without any hypoxia.  Currently on comfort care ? ?Dementia due to Parkinson'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 behavioral disturbance ([place]) ?Worsening mental status at home.  Patient was on carbidopa Vimpat Keppra and amantadine at home.  Currently has been transitioned to comfort care ? ?Parkinson disease ([place]) ?Patient was on Sinemet and amantadine from home.  Currently on Vimpat and Keppra. ? ?Diabetes mellitus type 2, controlled ([place]) ?On comfort care..  Hold metformin from home. ? ?Seizure disorder ([place]) ?Continue keppra and vimpat. ? ?Goals of care, counseling/discussion ?Palliative care on board.  Patient has been transitioned to comfort care at this time.  Patient will be transition to residential hospice and [facility] has been consulted. ? ?Hypomagnesemia ?Was replenished through IV and orally.  Transitioning to comfort care at this time ? ?Hypokalemia ?Was replenished. ? ?PAF (paroxysmal atrial fibrillation) ([place]) ?Patient has history of atrial fibrillation but not on anticoagulation as outpatient.  Currently normal sinus rhythm.  Unable to use beta-blocker due to bradycardia. ? ?Hyperlipidemia with target LDL less than 70 ?Continue Lipitor. ? ? ? DVT prophylaxis:   None for comfort ? ? ?Code Status:   ?  Code Status: DNR ? ?Disposition: Residential hospice when bed is available. ? ?Status is: Inpatient ? ?Remains inpatient appropriate because: ST elevation MI, comfort care, awaiting for residential hospice ? ? Family Communication:  ?None today ? ?Consultants:  ?Cardiology ?Palliative care ? ?Procedures:  ?None ? ?Antimicrobials:  ?None ? ?Anti-infectives (From admission, onward)  ? ? None  ? ?  ? ? ?  Subjective: ?Today, patient was seen and examined at bedside.  Appears to be more in this agitated.  Somnolent.   ? ?Objective: ?Vitals:  ? 09/05/21 2100 09/06/21 1500 09/06/21 2306 09/07/21 0454  ?BP: 111/69 112/79  115/61  ?Pulse:  68 73 97   ?Resp: '18 18  20  '$ ?Temp:  97.6 ?F (36.4 ?C)  97.8 ?F (36.6 ?C)  ?TempSrc:  Oral  Axillary  ?SpO2:   96% 98%  ?Weight:      ?Height:      ? ? ?Intake/Output Summary (Last 24 hours) at 09/07/2021 1159 ?Last data filed at 09/06/2021 2200 ?Gross per 24 hour  ?Intake 200 ml  ?Output 400 ml  ?Net -200 ml  ? ? ?Filed Weights  ? 09/04/21 0037 09/04/21 0612  ?Weight: 64.6 kg 67.4 kg  ? ?Body mass index is 20.15 kg/m?.  ? ?Physical Examination: ? ?General:  Average built, calm and somnolent today, underlying dementia, ?HENT:   No scleral pallor or icterus noted. Oral mucosa is moist.  ?Chest:    Diminished breath sounds bilaterally. No crackles or wheezes.  ?CVS: S1 &S2 heard. No murmur.  Regular rate and rhythm. ?Abdomen: Soft, nontender, nondistended.  Bowel sounds are heard.   ?Extremities: No cyanosis, clubbing or edema.   ?Psych: Somnolent and calm and comfortable  ?CNS: Somnolent, spontaneously moves extremities ?Skin: Warm and dry.  No rashes noted. ? ? ? ?Data Reviewed:  ? ?CBC: ?Recent Labs  ?Lab 09/03/21 ?2055 09/04/21 ?0216 09/05/21 ?2395  ?WBC 12.3* 12.0* 9.3  ?NEUTROABS 8.8*  --   --   ?HGB 16.0 14.9 12.5*  ?HCT 50.0 45.0 38.9*  ?MCV 89.6 87.2 87.6  ?PLT 357 317 292  ? ? ? ?Basic Metabolic Panel: ?Recent Labs  ?Lab 09/03/21 ?2055 09/04/21 ?0216 09/05/21 ?3202  ?NA 141 138 138  ?K 4.0 4.4 3.4*  ?CL 102 102 104  ?CO2 17* 17* 23  ?GLUCOSE 167* 104* 134*  ?BUN '18 23 18  '$ ?CREATININE 1.36* 1.21 0.99  ?CALCIUM 9.1 8.6* 8.3*  ?MG  --   --  1.4*  ? ? ? ?Liver Function Tests: ?Recent Labs  ?Lab 09/03/21 ?2055  ?AST 32  ?ALT 12  ?ALKPHOS 103  ?BILITOT 1.6*  ?PROT 7.5  ?ALBUMIN 3.5  ? ? ? ? ?Radiology Studies: ?No results found. ? ? ? LOS: 4 days  ? ? ?Flora Lipps, MD ?[facility] ?09/07/2021, 11:59 AM  ? ? ?

## 2021-09-07 NOTE — Progress Notes (Signed)
Spoke with Dr. Louanne Belton and advised that pt pulled out IV. Provider advised due to pt being comfort care it is okay to hold on placing another IV unless fluids are needed.  ?

## 2021-09-08 NOTE — Progress Notes (Signed)
?PROGRESS NOTE ? ? ? Gerald Leach  NUU:[id number] DOB: [birth year] DOA: 09/03/2021 ?PCP: [name], NP  ? ? ?Brief Narrative:  ?Gerald Leach is a 88 y.o. male with medical history significant of Parkinson's disease with dementia, seizure disorder on antiepileptics, hypertension presented to hospital with worsening mental status for a week or two and was minimally verbal for 1 week.  Patient had sudden onset of severe altered mental status at home and EMS was called in.  EMS noted that the patient was in ventricular tachycardia and cardioverted and had the EKG pattern of ST elevation MI.  Patient was then brought into the hospital as a code STEMI.  In the ED, cardiology was consulted and due to his frail status with advanced dementia conservative treatment was pursued.  Patient was a started on amiodarone and heparin drip and was admitted to the hospital for further evaluation and treatment.  During hospitalization, palliative care was consulted and at this time family has decided to proceed with comfort care  ? ?  ?Assessment and Plan: ?Ventricular tachycardia ?Likely secondary to ST elevation MI.  Cardioverted x1.  Patient was initiated on amiodarone drip which was changed to oral amiodarone but currently on comfort care ? ?Acute ST elevation myocardial infarction (STEMI) of anterior wall (HCC) ? STEMI with monomorphic VT in field.  Patient was cardioverted x1 at the field site.  Seen by cardiology here and due to frailty and dementia not a candidate for left heart catheterization.  Patient is DNR.  Patient was on aspirin, Plavix and high intensity Lipitor, low-dose beta-blocker as tolerated,  nitroglycerin and amiodarone .   Was on a heparin drip which was changed to Lovenox. 2D echocardiogram with decreased LV function at 30 to 35% with septal apical and anterior wall hypokinesis.  At this time, patient on comfort care ? ?COVID-19 virus infection ?Patient was having diarrhea and poor functioning at home  likely secondary to COVID-19 infection.  Mild infection without any hypoxia.  Currently on comfort care ? ?Dementia due to Parkinson'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 behavioral disturbance ([place]) ?Worsening mental status at home.  Patient was on carbidopa Vimpat Keppra and amantadine at home.  Currently has been transitioned to comfort care ? ?Parkinson disease ([place]) ?Patient was on Sinemet and amantadine from home.  Currently on Vimpat and Keppra. ? ?Diabetes mellitus type 2, controlled ([place]) ?On comfort care..  Hold metformin from home. ? ?Seizure disorder ([place]) ?Continue keppra and vimpat. ? ?Goals of care, counseling/discussion ?Palliative care on board.  Patient has been transitioned to comfort care at this time.  Patient will be transition to residential hospice and [facility] has been consulted. ? ?Hypomagnesemia ?Was replenished through IV and orally.  Transitioning to comfort care at this time ? ?Hypokalemia ?Was replenished. ? ?PAF (paroxysmal atrial fibrillation) ([place]) ?Patient has history of atrial fibrillation but not on anticoagulation as outpatient.  Currently normal sinus rhythm.  Unable to use beta-blocker due to bradycardia. ? ?Hyperlipidemia with target LDL less than 70 ?Continue Lipitor. ? ? ? DVT prophylaxis:   None for comfort ? ? ?Code Status:   ?  Code Status: DNR ? ?Disposition: Residential hospice when bed is available. ? ?Status is: Inpatient ? ?Remains inpatient appropriate because: ST elevation MI, comfort care, awaiting for residential hospice ? ? Family Communication:  ?Spoke with the patient's son on 09/07/2021 ? ?Consultants:  ?Cardiology ?Palliative care ? ?Procedures:  ?None ? ?Antimicrobials:  ?None ? ?Anti-infectives (From admission, onward)  ? ? None  ? ?  ? ? ?  Subjective: ?Today, patient was seen and examined at bedside.  Appears to at this time.  Denies any pain, shortness of breath but says no to all questions.  Poor historian.   ?Objective: ?Vitals:  ? 09/06/21 2306 09/07/21 0454 09/07/21 1949  09/08/21 1011  ?BP:  115/61 121/82 124/82  ?Pulse: 73 97 77 70  ?Resp:  '20 17 17  '$ ?Temp:  97.8 ?F (36.6 ?C) 97.8 ?F (36.6 ?C) 97.8 ?F (36.6 ?C)  ?TempSrc:  Axillary Axillary Oral  ?SpO2: 96% 98% 98% 97%  ?Weight:      ?Height:      ? ? ?Intake/Output Summary (Last 24 hours) at 09/08/2021 1019 ?Last data filed at 09/07/2021 1955 ?Gross per 24 hour  ?Intake --  ?Output 400 ml  ?Net -400 ml  ? ?Filed Weights  ? 09/04/21 0037 09/04/21 0612  ?Weight: 64.6 kg 67.4 kg  ? ?Body mass index is 20.15 kg/m?.  ? ?Physical Examination: ? ?General:  Average built, not in obvious distress, calm, saying no to all questions, ?HENT:   No scleral pallor or icterus noted. Oral mucosa is dry. ?Chest:  Clear breath sounds.  Diminished breath sounds bilaterally. No crackles or wheezes.  ?CVS: S1 &S2 heard. No murmur.  Regular rate and rhythm. ?Abdomen: Soft, nontender, nondistended.  Bowel sounds are heard.   ?Extremities: No cyanosis, clubbing or edema.  Peripheral pulses are palpable. ?Psych: Alert, awake, communicative, answering no to all questions, underlying dementia. ?CNS:.  Moves all the extremities ?Skin: Warm and dry.  No rashes noted. ? ? ?Data Reviewed:  ? ?CBC: ?Recent Labs  ?Lab 09/03/21 ?2055 09/04/21 ?0216 09/05/21 ?2035  ?WBC 12.3* 12.0* 9.3  ?NEUTROABS 8.8*  --   --   ?HGB 16.0 14.9 12.5*  ?HCT 50.0 45.0 38.9*  ?MCV 89.6 87.2 87.6  ?PLT 357 317 292  ? ? ?Basic Metabolic Panel: ?Recent Labs  ?Lab 09/03/21 ?2055 09/04/21 ?0216 09/05/21 ?5974  ?NA 141 138 138  ?K 4.0 4.4 3.4*  ?CL 102 102 104  ?CO2 17* 17* 23  ?GLUCOSE 167* 104* 134*  ?BUN '18 23 18  '$ ?CREATININE 1.36* 1.21 0.99  ?CALCIUM 9.1 8.6* 8.3*  ?MG  --   --  1.4*  ? ? ?Liver Function Tests: ?Recent Labs  ?Lab 09/03/21 ?2055  ?AST 32  ?ALT 12  ?ALKPHOS 103  ?BILITOT 1.6*  ?PROT 7.5  ?ALBUMIN 3.5  ? ? ? ?Radiology Studies: ?No results found. ? ? ? LOS: 5 days  ? ? ?Flora Lipps, MD ?[facility] ?09/08/2021, 10:19 AM  ? ? ?

## 2021-09-08 NOTE — Progress Notes (Signed)
Patient [id number] C [place]      DOB: Mar 29, 1934      YPP:[id number] ? ? ? ?  ?Palliative Medicine Team ? ? ? ?Subjective: Bedside symptom check. No family or other visitors bedside at time of visit. ? ? ?Physical exam: Patient resting with eyes closed at time of visit. Patient displaying even, non-labored breathing without excessive secretions noted. Strong radial pulse observed and extremities warm and pink. No physical or non-verbal symptoms of pain, discomfort, distress noted.  ? ? ?Assessment and plan: This RN sat next to the patient's bed and gently called his name. He did wake and slightly open one eye to voice. He immediately closed his eye again and said "Please let me rest." This RN introduced herself and role in palliative care, asking if there were any needs or concerns, any pain being experienced. The patient denied any pain or discomfort. This RN assisted the patient to apply pillows under head, between knees, and pulled a sheet up over his body. He remained resting comfortably and RN left patient's room. This RN checked in with bedside RN who denies any needs or concerns at this time. Will continue to follow for changes or advances.  ? ? ?Thank you for allowing the Palliative Medicine Team to assist in the care of this patient. ?  ?  ?Damian Leavell, MSN, RN ?Palliative Medicine Team ?Team Phone: [phone]  ?This phone is monitored 7a-7p, please reach out to attending physician outside of these hours for urgent needs.   ?

## 2021-09-08 NOTE — Progress Notes (Addendum)
[facility]) Hospital Liaison note: ? ?Continuing to follow patient while on isolation through 09/12/21. Will evaluate patient at the end of his isolation period. ? ?Please call with any hospice related questions or concerns. ? ?Thank you, ?Lorelee Market, LPN ?[facility] Hospital Liaison ?[phone] ? ?

## 2021-09-09 ENCOUNTER — Other Ambulatory Visit: Payer: Self-pay

## 2021-09-09 ENCOUNTER — Encounter (HOSPITAL_COMMUNITY): Payer: Self-pay | Admitting: Internal Medicine

## 2021-09-09 DIAGNOSIS — U071 COVID-19: Secondary | ICD-10-CM | POA: Diagnosis not present

## 2021-09-09 DIAGNOSIS — G20 Parkinson's disease: Secondary | ICD-10-CM | POA: Diagnosis not present

## 2021-09-09 DIAGNOSIS — E119 Type 2 diabetes mellitus without complications: Secondary | ICD-10-CM | POA: Diagnosis not present

## 2021-09-09 DIAGNOSIS — I2109 ST elevation (STEMI) myocardial infarction involving other coronary artery of anterior wall: Secondary | ICD-10-CM | POA: Diagnosis not present

## 2021-09-09 MED ORDER — SODIUM CHLORIDE 0.9 % IV SOLN: INTRAVENOUS | Status: DC | PRN

## 2021-09-09 NOTE — Progress Notes (Signed)
?PROGRESS NOTE ? ? ? Gerald Leach  WIO:[id number] DOB: 21-Jan-1934 DOA: 09/03/2021 ?PCP: [name], NP  ? ? ?Brief Narrative:  ?Gerald Leach is a 88 y.o. male with medical history significant of Parkinson's disease with dementia, seizure disorder on antiepileptics, hypertension presented to hospital with worsening mental status for a week or two and was minimally verbal for 1 week.  Patient had sudden onset of severe altered mental status at home and EMS was called in.  EMS noted that the patient was in ventricular tachycardia and cardioverted and had the EKG pattern of ST elevation MI.  Patient was then brought into the hospital as a code STEMI.  In the ED, cardiology was consulted and due to his frail status with advanced dementia conservative treatment was pursued.  Patient was a started on amiodarone and heparin drip and was admitted to the hospital for further evaluation and treatment.  During hospitalization, palliative care was consulted and at this time family has decided to proceed with comfort care  ? ? Assessment and Plan: ?Ventricular tachycardia ?Likely secondary to ST elevation MI.  Cardioverted x1.  Patient was initiated on amiodarone drip which was changed to oral amiodarone but currently on comfort care ? ?Acute ST elevation myocardial infarction (STEMI) of anterior wall (HCC) ? STEMI with monomorphic VT in field.  Patient was cardioverted x1 at the field site.  Seen by cardiology here and due to frailty and dementia not a candidate for left heart catheterization.  Patient is DNR.  Patient was on aspirin, Plavix and high intensity Lipitor, low-dose beta-blocker as tolerated,  nitroglycerin and amiodarone .   Was on a heparin drip which was changed to Lovenox. 2D echocardiogram with decreased LV function at 30 to 35% with septal apical and anterior wall hypokinesis.  At this time, patient on comfort care ? ?COVID-19 virus infection ?Patient was having diarrhea and poor functioning at home  likely secondary to COVID-19 infection.  Mild infection without any hypoxia.  Currently on comfort care ? ?Dementia due to Parkinson'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 behavioral disturbance ([place]) ?Worsening mental status at home.  Patient was on carbidopa Vimpat Keppra and amantadine at home.  Currently has been transitioned to comfort care ? ?Parkinson disease ([place]) ?Patient was on Sinemet and amantadine from home.  Currently on Vimpat and Keppra. ? ?Diabetes mellitus type 2, controlled ([place]) ?On comfort care..  Hold metformin from home. ? ?Seizure disorder ([place]) ?Continue keppra and vimpat. ? ?Goals of care, counseling/discussion ?Palliative care on board.  Patient has been transitioned to comfort care at this time.  Patient will be transition to residential hospice and [facility] has been consulted. ? ?Hypomagnesemia ?Was replenished through IV and orally.  Transitioning to comfort care at this time ? ?Hypokalemia ?Was replenished. ? ?PAF (paroxysmal atrial fibrillation) ([place]) ?Patient has history of atrial fibrillation but not on anticoagulation as outpatient.  Currently normal sinus rhythm.  Unable to use beta-blocker due to bradycardia. ? ?Hyperlipidemia with target LDL less than 70 ?Continue Lipitor. ? ? ? DVT prophylaxis:   None for comfort ? ? ?Code Status:   ?  Code Status: DNR ? ?Disposition: Residential hospice when bed is available. ? ?Status is: Inpatient ? ?Remains inpatient appropriate because: ST elevation MI, comfort care, awaiting for residential hospice ? ? Family Communication:  ?Spoke with the patient's son on 09/07/2021. ? ?Consultants:  ?Cardiology ?Palliative care ? ?Procedures:  ?None ? ?Antimicrobials:  ?None ? ?Anti-infectives (From admission, onward)  ? ? None  ? ?  ? ? ?  Subjective: ?Today, patient was seen and examined at bedside.  Complains of some mild weakness.  Denies any pain, nausea, vomiting.  No fever or chills. ?  ? ?Objective: ?Vitals:  ? 09/06/21 2306 09/07/21 0454 09/07/21 1949 09/08/21 1011  ?BP:   115/61 121/82 124/82  ?Pulse: 73 97 77 70  ?Resp:  '20 17 17  '$ ?Temp:  97.8 ?F (36.6 ?C) 97.8 ?F (36.6 ?C) 97.8 ?F (36.6 ?C)  ?TempSrc:  Axillary Axillary Oral  ?SpO2: 96% 98% 98% 97%  ?Weight:      ?Height:      ? ? ?Intake/Output Summary (Last 24 hours) at 09/09/2021 9292 ?Last data filed at 09/08/2021 1600 ?Gross per 24 hour  ?Intake 547.09 ml  ?Output 250 ml  ?Net 297.09 ml  ? ? ?Filed Weights  ? 09/04/21 0037 09/04/21 0612  ?Weight: 64.6 kg 67.4 kg  ? ?Body mass index is 20.15 kg/m?.  ? ?Physical Examination: ? ?General:  Average built, not in obvious distress, underlying dementia communicative ?HENT:   No scleral pallor or icterus noted. Oral mucosa is moist.  ?Chest:    Diminished breath sounds bilaterally. ?CVS: S1 &S2 heard. No murmur.  Regular rate and rhythm. ?Abdomen: Soft, nontender, nondistended.  Bowel sounds are heard.   ?Extremities: No cyanosis, clubbing or edema.  Peripheral pulses are palpable. ?Psych: Alert awake and oriented to place and time. ?CNS: Alert awake and oriented. ?Skin: Warm and dry.  No rashes noted. ? ?Data Reviewed:  ? ?CBC: ?Recent Labs  ?Lab 09/03/21 ?2055 09/04/21 ?0216 09/05/21 ?4462  ?WBC 12.3* 12.0* 9.3  ?NEUTROABS 8.8*  --   --   ?HGB 16.0 14.9 12.5*  ?HCT 50.0 45.0 38.9*  ?MCV 89.6 87.2 87.6  ?PLT 357 317 292  ? ? ? ?Basic Metabolic Panel: ?Recent Labs  ?Lab 09/03/21 ?2055 09/04/21 ?0216 09/05/21 ?8638  ?NA 141 138 138  ?K 4.0 4.4 3.4*  ?CL 102 102 104  ?CO2 17* 17* 23  ?GLUCOSE 167* 104* 134*  ?BUN '18 23 18  '$ ?CREATININE 1.36* 1.21 0.99  ?CALCIUM 9.1 8.6* 8.3*  ?MG  --   --  1.4*  ? ? ? ?Liver Function Tests: ?Recent Labs  ?Lab 09/03/21 ?2055  ?AST 32  ?ALT 12  ?ALKPHOS 103  ?BILITOT 1.6*  ?PROT 7.5  ?ALBUMIN 3.5  ? ? ? ?Radiology Studies: ?No results found. ? ? ? LOS: 6 days  ? ? ?Flora Lipps, MD ?[facility] ?09/09/2021, 8:12 AM  ? ? ?

## 2021-09-09 NOTE — Progress Notes (Signed)
[facility]) Hospital Liaison Note ? ?ACC continues to follow patient peripherally while on Covid precautions. Will evaluate for IPU appropriateness at end of isolation. ? ?Please call with any hospice related questions or concerns. ? ?Thank you, ?Margaretmary Eddy, BSN, RN ?[facility] Liaison ?[phone] ?

## 2021-09-09 NOTE — Progress Notes (Signed)
?                                                   ?  Palliative Care Progress Note, Assessment & Plan  ? ?Patient Name: Gerald Leach       Date: 09/09/2021 ?DOB: Jun 23, 1933  Age: 88 y.o. MRN#: [id number] ?Attending Physician: Flora Lipps, MD ?Primary Care Physician: [name], NP ?Admit Date: 09/03/2021 ? ?Reason for Consultation/Follow-up: Establishing goals of care, Non pain symptom management, Pain control, and Psychosocial/spiritual support ? ?HPI: ?88 y.o. male  with past medical history of PD with dementia, seizure disorder on AEDs, atrial fibrillation, DM2, HLD, HTN admitted on 09/03/2021 with worsening mental status. ?  ?Patient was found to have VT rhythm by EMS, cardioverted and then found STEMI pattern. COVID positive and overall failing to thrive. PMT has been consulted to assist with goals of care conversation. ? ?Summary of counseling/coordination of care: ?After reviewing the patient's chart I assessed the patient at bedside.  Patient is lying in bed in no apparent distress.  Respirations are even and unlabored.  Patient is responsive to light touch and verbal stimuli but quickly returns to sleep.  He remains on precautions for Covid19. No family at bedside. ? ?Continue comfort measures. ? ?I spoke with Gerald Leach and confirmed patient is still to be evaluated when Covid19 precautions end on 3/28.  ? ?Code Status: ?DNR ? ?Prognosis: ?< 6 weeks ? ?Discharge Planning: ?Hospice facility ? ?Care plan was discussed with patient, Hospice Liaison Traci ? ?Physical Exam ?Vitals and nursing note reviewed.  ?Constitutional:   ?   General: He is not in acute distress. ?   Appearance: Normal appearance. He is not ill-appearing or toxic-appearing.  ?HENT:  ?   Head: Normocephalic.  ?   Mouth/Throat:  ?   Mouth: Mucous membranes are moist.  ?Cardiovascular:  ?    Rate and Rhythm: Normal rate.  ?Pulmonary:  ?   Effort: Pulmonary effort is normal.  ?Abdominal:  ?   Palpations: Abdomen is soft.  ?Musculoskeletal:  ?   Comments: Generalized weakness  ?Skin: ?   General: Skin is warm and dry.  ?         ? ?Palliative Assessment/Data: 20% ? ? ? ?Total Time 25 minutes  ?Greater than 50%  of this time was spent counseling and coordinating care related to the above assessment and plan. ? ?Thank you for allowing the Palliative Medicine Team to assist in the care of this patient. ? ?Verdell Carmine. Alara Daniel, DNP, FNP-BC ?Palliative Medicine Team ?Team Phone # [phone] ?  ?

## 2021-09-10 MED ORDER — MORPHINE SULFATE (PF) 2 MG/ML IV SOLN
2.0000 mg | Freq: Four times a day (QID) | INTRAVENOUS | Status: DC
  Administered 2021-09-10 – 2021-09-11 (×2): 2 mg via INTRAVENOUS
  Filled 2021-09-10: qty 1

## 2021-09-10 MED ORDER — SODIUM CHLORIDE 0.9 % IV SOLN
150.0000 mg | Freq: Two times a day (BID) | INTRAVENOUS | Status: DC
  Administered 2021-09-10 – 2021-09-13 (×6): 150 mg via INTRAVENOUS
  Filled 2021-09-10 (×7): qty 15

## 2021-09-10 MED ORDER — LEVETIRACETAM 500 MG PO TABS: 1500.0000 mg | ORAL_TABLET | Freq: Two times a day (BID) | ORAL | Status: DC

## 2021-09-10 MED ORDER — LEVETIRACETAM IN NACL 1500 MG/100ML IV SOLN
1500.0000 mg | Freq: Two times a day (BID) | INTRAVENOUS | Status: DC
  Administered 2021-09-10 – 2021-09-13 (×6): 1500 mg via INTRAVENOUS
  Filled 2021-09-10 (×7): qty 100

## 2021-09-10 NOTE — Progress Notes (Signed)
? ?  Palliative Medicine Inpatient Follow Up Note ? ?HPI: ?88 y.o. male  with past medical history of PD with dementia, seizure disorder on AEDs, atrial fibrillation, DM2, HLD, HTN admitted on 09/03/2021 with worsening mental status. ?  ?Patient was found to have VT rhythm by EMS, cardioverted and then found STEMI pattern. COVID positive and overall failing to thrive. PMT has been consulted to assist with goals of care conversation ? ?Today's Discussion (09/10/2021): ? ?*Please note that this is a verbal dictation therefore any spelling or grammatical errors are due to the "[place] One" system interpretation. ? ?Chart reviewed inclusive of vital signs, progress notes, laboratory results, and diagnostic images.  ? ?I spoke to patients RN, Judson Roch who shares family was recently at bedside and endorsed patient was have some LBP. ? ?I met with Gerald Leach at bedside. There was no family present. He was noted to be grimacing. I repositioned him, he moaned during this time. Per review of MAR patient has no standing ATC medications for pain. I was able to order morphine $RemoveBefore'2mg'[id number]$  IVP Q6H to better support discomfort.  ? ?I called patients son, Gerald Leach and reviewed patients symptom management plan. He shares he will be here at [facility] tomorrow and we have agreed to meet.  ? ?As far as discharge planning. Ideally once off COVID-19 isolations patient will transition to [facility] though this would not take place until Wednesday. ? ?Palliative Support Provided ? ?Objective Assessment: ?Vital Signs ?Vitals:  ? 09/09/21 1945 09/10/21 1432  ?BP: 121/72 121/81  ?Pulse: 71 70  ?Resp: 18 19  ?Temp: 97.8 ?F (36.6 ?C) 97.7 ?F (36.5 ?C)  ?SpO2: 98% 96%  ? ? ?Intake/Output Summary (Last 24 hours) at 09/10/2021 1611 ?Last data filed at 09/10/2021 1344 ?Gross per 24 hour  ?Intake 711.08 ml  ?Output 825 ml  ?Net -113.92 ml  ? ?Last Weight  Most recent update: 09/04/2021  6:17 AM  ? ? Weight  ?67.4 kg (148 lb 9.4 oz)  ?      ? ?  ? ?Gen:  Elderly Caucasian  M in moderate distress ?HEENT: moist mucous membranes ?CV: Regular rate and irregular rhythm ?PULM: On RA, breathing is even and nonlabored ?ABD: soft/nontender ?EXT: No edema  ?Neuro: Sleeping intermittently but does arouse ? ?SUMMARY OF RECOMMENDATIONS   ?DNAR/DNI ? ?Comfort focused care ? ?Have added morphine $RemoveBefore'2mg'ubiRNymytFWmi$  IVP Q6H ATC to better aid in symptom relief ? ?Family will be here at [place] tomorrow I plan to meet with them to provide an update ? ?Unrestricted visitation ? ?Patient will be off COVID-19 isolation on 3/28 ? ?Ongoing Palliative Support ? ?MDM - High in the setting of review and modification of controlled substances to be administered parenterally (IVP) for symptom relief.  ?______________________________________________________________________________________ ?Tacey Ruiz ?[facility] Team ?Team Cell Phone: [phone] ?Please utilize secure chat with additional questions, if there is no response within 30 minutes please call the above phone number ? ?Palliative Medicine Team providers are available by phone from 7am to 7pm daily and can be reached through the team cell phone.  ?Should this patient require assistance outside of these hours, please call the patient's attending physician. ? ? ? ? ?

## 2021-09-10 NOTE — Progress Notes (Signed)
?PROGRESS NOTE ? ? ? Gerald Leach  PZW:[id number] DOB: August 02, 1933 DOA: 09/03/2021 ?PCP: Gerald Peng, NP  ? ? ?Brief Narrative:  ?Gerald Leach is a 88 y.o. male with medical history significant of Parkinson's disease with dementia, seizure disorder on antiepileptics, hypertension presented to hospital with worsening mental status for a week or two and was minimally verbal for 1 week.  Patient had sudden onset of severe altered mental status at home and EMS was called in.  EMS noted that the patient was in ventricular tachycardia and cardioverted and had the EKG pattern of ST elevation MI.  Patient was then brought into the hospital as a code STEMI.  In the ED, cardiology was consulted and due to his frail status with advanced dementia conservative treatment was pursued.  Patient was a started on amiodarone and heparin drip and was admitted to the hospital for further evaluation and treatment.  During hospitalization, palliative care was consulted and at this time family has decided to proceed with comfort care  ? ? Assessment and Plan: ?Ventricular tachycardia ?Likely secondary to ST elevation MI.  Cardioverted x1.  Patient was initiated on amiodarone drip which was changed to oral amiodarone but currently on comfort care ? ?Acute ST elevation myocardial infarction (STEMI) of anterior wall (HCC) ? STEMI with monomorphic VT in field.  Patient was cardioverted x1 at the field site.  Seen by cardiology here and due to frailty and dementia not a candidate for left heart catheterization.  Patient is DNR.  Patient was on aspirin, Plavix and high intensity Lipitor, low-dose beta-blocker as tolerated,  nitroglycerin and amiodarone .   Was on a heparin drip which was changed to Lovenox. 2D echocardiogram with decreased LV function at 30 to 35% with septal apical and anterior wall hypokinesis.  At this time, patient on comfort care ? ?COVID-19 virus infection ?Patient was having diarrhea and poor functioning at home  likely secondary to COVID-19 infection.  Mild infection without any hypoxia.  Currently on comfort care ? ?Dementia due to Parkinson'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 behavioral disturbance ([place]) ?Worsening mental status at home.  Patient was on carbidopa Vimpat Keppra and amantadine at home.  Currently has been transitioned to comfort care ? ?Parkinson disease ([place]) ?Patient was on Sinemet and amantadine from home.  Currently on Vimpat and Keppra. ? ?Diabetes mellitus type 2, controlled ([place]) ?On comfort care..  Hold metformin from home. ? ?Seizure disorder ([place]) ?Continue keppra and vimpat. ? ?Goals of care, counseling/discussion ?Palliative care on board.  Patient has been transitioned to comfort care at this time.  Patient will be transition to residential hospice and [facility] has been consulted. ? ?Hypomagnesemia ?Was replenished through IV and orally.  Transitioning to comfort care at this time ? ?Hypokalemia ?Was replenished. ? ?PAF (paroxysmal atrial fibrillation) ([place]) ?Patient has history of atrial fibrillation but not on anticoagulation as outpatient.  Currently normal sinus rhythm.  Unable to use beta-blocker due to bradycardia. ? ?Hyperlipidemia with target LDL less than 70 ?Continue Lipitor. ? ? ? DVT prophylaxis:   None for comfort ? ? ?Code Status:   ?  Code Status: DNR ? ?Disposition: Residential hospice when bed is available. ? ?Status is: Inpatient ? ?Remains inpatient appropriate because: ST elevation MI, comfort care, awaiting for residential hospice ? ? Family Communication:  ?Spoke with the patient's son on 09/07/2021. ? ?Consultants:  ?Cardiology ?Palliative care ? ?Procedures:  ?None ? ?Antimicrobials:  ?None ? ?Anti-infectives (From admission, onward)  ? ? None  ? ?  ? ? ?  Subjective: ?Today, patient was seen and examined at bedside.  Appears to be little more somnolent today but comfortable. ? ?Objective: ?Vitals:  ? 09/07/21 1949 09/08/21 1011 09/09/21 1226 09/09/21 1945  ?BP: 121/82 124/82 120/80 121/72   ?Pulse: 77 70 82 71  ?Resp: '17 17 18 18  '$ ?Temp: 97.8 ?F (36.6 ?C) 97.8 ?F (36.6 ?C) (!) 97.5 ?F (36.4 ?C) 97.8 ?F (36.6 ?C)  ?TempSrc: Axillary Oral Axillary Axillary  ?SpO2: 98% 97% 96% 98%  ?Weight:      ?Height:      ? ? ?Intake/Output Summary (Last 24 hours) at 09/10/2021 1145 ?Last data filed at 09/10/2021 2878 ?Gross per 24 hour  ?Intake 691.08 ml  ?Output 825 ml  ?Net -133.92 ml  ? ? ?Filed Weights  ? 09/04/21 0037 09/04/21 0612  ?Weight: 64.6 kg 67.4 kg  ? ?Body mass index is 20.15 kg/m?.  ? ?Physical Examination: ? ?General:  Average built, not in obvious distress, mildly somnolent ?HENT:   No scleral pallor or icterus noted. Oral mucosa is moist.  ?Chest: .  Diminished breath sounds bilaterally. No crackles or wheezes.  ?CVS: S1 &S2 heard. No murmur.  Regular rate and rhythm. ?Abdomen: Soft, nontender, nondistended.  Bowel sounds are heard.   ?Extremities: No cyanosis, clubbing or edema.  Peripheral pulses are palpable. ?Psych: Somnolent today. ?CNS: Somnolent, moving extremities  ?skin: Warm and dry.  No rashes noted. ? ? ?Data Reviewed:  ? ?CBC: ?Recent Labs  ?Lab 09/03/21 ?2055 09/04/21 ?0216 09/05/21 ?6767  ?WBC 12.3* 12.0* 9.3  ?NEUTROABS 8.8*  --   --   ?HGB 16.0 14.9 12.5*  ?HCT 50.0 45.0 38.9*  ?MCV 89.6 87.2 87.6  ?PLT 357 317 292  ? ? ? ?Basic Metabolic Panel: ?Recent Labs  ?Lab 09/03/21 ?2055 09/04/21 ?0216 09/05/21 ?2094  ?NA 141 138 138  ?K 4.0 4.4 3.4*  ?CL 102 102 104  ?CO2 17* 17* 23  ?GLUCOSE 167* 104* 134*  ?BUN '18 23 18  '$ ?CREATININE 1.36* 1.21 0.99  ?CALCIUM 9.1 8.6* 8.3*  ?MG  --   --  1.4*  ? ? ? ?Liver Function Tests: ?Recent Labs  ?Lab 09/03/21 ?2055  ?AST 32  ?ALT 12  ?ALKPHOS 103  ?BILITOT 1.6*  ?PROT 7.5  ?ALBUMIN 3.5  ? ? ? ?Radiology Studies: ?No results found. ? ? ? LOS: 7 days  ? ? ?Flora Lipps, MD ?[facility] ?09/10/2021, 11:45 AM  ? ? ?

## 2021-09-11 MED ORDER — MORPHINE SULFATE (PF) 2 MG/ML IV SOLN
2.0000 mg | INTRAVENOUS | Status: DC
  Administered 2021-09-11 – 2021-09-13 (×9): 2 mg via INTRAVENOUS
  Filled 2021-09-11 (×9): qty 1

## 2021-09-11 NOTE — TOC Progression Note (Signed)
Transition of Care (TOC) - Progression Note  ? ? ?Patient Details  ?Name: Gerald Leach ?MRN: [id number] ?Date of Birth: [birth year] ? ?Transition of Care (TOC) CM/SW Contact  ?Milas Gain, LCSWA ?Phone Number: ?09/11/2021, 5:05 PM ? ?Clinical Narrative:    ? ?CSW continues to follow patient. Patient was [facility] to [facility] following patient and will evaluate patient closer to the end of patients isolation period.CSW will continue to follow and assist with patients dc planning needs. ? ?Expected Discharge Plan: [place] ?Barriers to Discharge: Hospice Bed not available ? ?Expected Discharge Plan and Services ?Expected Discharge Plan: [place] ?  ?  ?Post Acute Care Choice: Hospice ?  ?                ?  ?  ?  ?  ?  ?  ?  ?  ?  ?  ? ? ?Social Determinants of Health (SDOH) Interventions ?  ? ?Readmission Risk Interventions ? ?  08/12/2020  ?  1:54 PM  ?Readmission Risk Prevention Plan  ?Post Dischage Appt Complete  ?Medication Screening Complete  ?Transportation Screening Complete  ? ? ?

## 2021-09-11 NOTE — Progress Notes (Signed)
?PROGRESS NOTE ? ? ? Gerald Leach  YNW:[id number] DOB: Mar 20, 1934 DOA: 09/03/2021 ?PCP: [name], NP  ? ? ?Brief Narrative:  ?Gerald Leach is a 88 y.o. male with medical history significant of Parkinson's disease with dementia, seizure disorder on antiepileptics, hypertension presented to hospital with worsening mental status for a week or two and was minimally verbal for 1 week.  Patient had sudden onset of severe altered mental status at home and EMS was called in.  EMS noted that the patient was in ventricular tachycardia and cardioverted and had the EKG pattern of ST elevation MI.  Patient was then brought into the hospital as a code STEMI.  In the ED, cardiology was consulted and due to his frail status with advanced dementia conservative treatment was pursued.  Patient was a started on amiodarone and heparin drip and was admitted to the hospital for further evaluation and treatment.  During hospitalization, palliative care was consulted and at this time family has decided to proceed with comfort care  ? ? Assessment and Plan: ?Ventricular tachycardia ?Likely secondary to ST elevation MI.  Cardioverted x1.  Patient was initiated on amiodarone drip which was changed to oral amiodarone but currently on comfort care ? ?Acute ST elevation myocardial infarction (STEMI) of anterior wall (HCC) ? STEMI with monomorphic VT in field.  Patient was cardioverted x1 at the field site.  Seen by cardiology here and due to frailty and dementia not a candidate for left heart catheterization.  Patient is DNR.  Patient was on aspirin, Plavix and high intensity Lipitor, low-dose beta-blocker as tolerated,  nitroglycerin and amiodarone .   Was on a heparin drip which was changed to Lovenox. 2D echocardiogram with decreased LV function at 30 to 35% with septal apical and anterior wall hypokinesis.  At this time, patient on comfort care ? ?COVID-19 virus infection ?Patient was having diarrhea and poor functioning at home  likely secondary to COVID-19 infection.  Mild infection without any hypoxia.  Currently on comfort care ? ?Dementia due to Parkinson'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 behavioral disturbance ([place]) ?Worsening mental status at home.  Patient was on carbidopa Vimpat Keppra and amantadine at home.  Currently has been transitioned to comfort care ? ?Parkinson disease ([place]) ?Patient was on Sinemet and amantadine from home.  Currently on Vimpat and Keppra. ? ?Diabetes mellitus type 2, controlled ([place]) ?On comfort care..  Hold metformin from home. ? ?Seizure disorder ([place]) ?Continue keppra and vimpat. ? ?Hypomagnesemia ?Was replenished through IV and orally.  Currently on comfort care. ? ?Hypokalemia ?Was replenished. ? ?PAF (paroxysmal atrial fibrillation) ([place]) ?Patient has history of atrial fibrillation but not on anticoagulation as outpatient.  Currently normal sinus rhythm.  Unable to use beta-blocker due to bradycardia. ? ?Hyperlipidemia with target LDL less than 70 ?On comfort care. ? ?Goals of care, counseling/discussion ?Palliative care on board.  Patient has been transitioned to comfort care at this time.  Awaiting for residential hospice  ? ? DVT prophylaxis:   None for comfort ? ? ?Code Status:   ?  Code Status: DNR ? ?Disposition: Residential hospice when bed is available, COVID isolation none ? ?Status is: Inpatient ? ?Remains inpatient appropriate because: ST elevation MI, comfort care, awaiting for residential hospice, COVID isolation ? ? Family Communication:  ?Spoke with the patient's son on 09/07/2021. ? ?Consultants:  ?Cardiology ?Palliative care ? ?Procedures:  ?None ? ?Antimicrobials:  ?None ? ?Anti-infectives (From admission, onward)  ? ? None  ? ?  ? ?Subjective: ?Today, patient was  seen and examined at bedside.  Patient denies overt pain nausea vomiting lying in bed.  Denies pain or shortness of breath.   ? ?Objective: ?Vitals:  ? 09/08/21 1011 09/09/21 1226 09/09/21 1945 09/10/21 1432  ?BP: 124/82 120/80 121/72  121/81  ?Pulse: 70 82 71 70  ?Resp: '17 18 18 19  '$ ?Temp: 97.8 ?F (36.6 ?C) (!) 97.5 ?F (36.4 ?C) 97.8 ?F (36.6 ?C) 97.7 ?F (36.5 ?C)  ?TempSrc: Oral Axillary Axillary Axillary  ?SpO2: 97% 96% 98% 96%  ?Weight:      ?Height:      ? ? ?Intake/Output Summary (Last 24 hours) at 09/11/2021 1028 ?Last data filed at 09/11/2021 0700 ?Gross per 24 hour  ?Intake 485 ml  ?Output 750 ml  ?Net -265 ml  ? ? ?Filed Weights  ? 09/04/21 0037 09/04/21 0612  ?Weight: 64.6 kg 67.4 kg  ? ?Body mass index is 20.15 kg/m?.  ? ?Physical Examination: ? ?General:  Average built, not in obvious distress, lying in bed, communicative ?HENT:   No scleral pallor or icterus noted. Oral mucosa is moist.  ?Chest:    Diminished breath sounds bilaterally. No crackles or wheezes.  ?CVS: S1 &S2 heard. No murmur.  Irregular rhythm. ?Abdomen: Soft, nontender, nondistended.  Bowel sounds are heard.   ?Extremities: No cyanosis, clubbing or edema.  Peripheral pulses are palpable. ?Psych: Alert, awake lying in bed, communicative, oriented to place. ?CNS:  No cranial nerve deficits.  Power equal in all extremities.   ?Skin: Warm and dry.  No rashes noted. ? ? ?Data Reviewed:  ? ?CBC: ?Recent Labs  ?Lab 09/05/21 ?4967  ?WBC 9.3  ?HGB 12.5*  ?HCT 38.9*  ?MCV 87.6  ?PLT 292  ? ? ? ?Basic Metabolic Panel: ?Recent Labs  ?Lab 09/05/21 ?5916  ?NA 138  ?K 3.4*  ?CL 104  ?CO2 23  ?GLUCOSE 134*  ?BUN 18  ?CREATININE 0.99  ?CALCIUM 8.3*  ?MG 1.4*  ? ? ? ?Liver Function Tests: ?No results for input(s): AST, ALT, ALKPHOS, BILITOT, PROT, ALBUMIN in the last 168 hours. ? ? ?Radiology Studies: ?No results found. ? ? ? LOS: 8 days  ? ? ?Flora Lipps, MD ?[facility] ?09/11/2021, 10:28 AM  ? ? ?

## 2021-09-11 NOTE — Progress Notes (Addendum)
? ?  Palliative Medicine Inpatient Follow Up Note ? ?HPI: ?88 y.o. male  with past medical history of PD with dementia, seizure disorder on AEDs, atrial fibrillation, DM2, HLD, HTN admitted on 09/03/2021 with worsening mental status. ?  ?Patient was found to have VT rhythm by EMS, cardioverted and then found STEMI pattern. COVID positive and overall failing to thrive. PMT has been consulted to assist with goals of care conversation ? ?Today's Discussion (09/11/2021): ? ?*Please note that this is a verbal dictation therefore any spelling or grammatical errors are due to the "[place] [employer]" system interpretation. ? ?Chart reviewed inclusive of vital signs, progress notes, laboratory results, and diagnostic images.  ? ?I met with Broadus John at bedside this morning. He shares that he was having some more back pain. He cannot rate this on a scale of 1-10. Knees cool though distal digit remain warm and nail beds well perfused. LLE pulse weak.  ? ?I met with patents son, [name] and daughter, Shirlean Mylar at bedside. Reviewed patients clinical state. I shared that I suspect [place] is encroaching the < 2 weeks window. ? ?Palliative Support Provided ? ?Objective Assessment: ?Vital Signs ?Vitals:  ? 09/09/21 1945 09/10/21 1432  ?BP: 121/72 121/81  ?Pulse: 71 70  ?Resp: 18 19  ?Temp: 97.8 ?F (36.6 ?C) 97.7 ?F (36.5 ?C)  ?SpO2: 98% 96%  ? ? ?Intake/Output Summary (Last 24 hours) at 09/11/2021 1040 ?Last data filed at 09/11/2021 0700 ?Gross per 24 hour  ?Intake 485 ml  ?Output 750 ml  ?Net -265 ml  ? ? ?Last Weight  Most recent update: 09/04/2021  6:17 AM  ? ? Weight  ?67.4 kg (148 lb 9.4 oz)  ?      ? ?  ? ?Gen:  Elderly Caucasian M in moderate distress ?HEENT: moist mucous membranes ?CV: Regular rate and irregular rhythm ?PULM: On RA, breathing is even and nonlabored ?ABD: soft/nontender ?EXT: No edema  ?Neuro: Sleeping intermittently but does arouse ? ?SUMMARY OF RECOMMENDATIONS   ?DNAR/DNI ? ?Comfort focused care ? ?Changed morphine $RemoveBeforeDEI'2mg'[id number]$   IVP Q4H ATC to better aid in symptom relief ? ?Unrestricted visitation ? ?Patient will be off COVID-19 isolation on 3/28 ? ?Ongoing Palliative Support ? ?Prognosis < 2 Weeks ? ?MDM - High in the setting of review and modification of controlled substances to be administered parenterally (IVP) for symptom relief.  ?______________________________________________________________________________________ ?Tacey Ruiz ?[place] Team ?Team Cell Phone: [phone] ?Please utilize secure chat with additional questions, if there is no response within 30 minutes please call the above phone number ? ?Palliative Medicine Team providers are available by phone from 7am to 7pm daily and can be reached through the team cell phone.  ?Should this patient require assistance outside of these hours, please call the patient's attending physician. ? ? ? ? ?

## 2021-09-12 MED ORDER — LEVOTHYROXINE SODIUM 75 MCG PO TABS
75.0000 ug | ORAL_TABLET | Freq: Every day | ORAL | Status: DC
  Administered 2021-09-13 – 2021-09-14 (×2): 75 ug via ORAL
  Filled 2021-09-12 (×2): qty 1

## 2021-09-12 MED ORDER — CARBIDOPA-LEVODOPA 25-100 MG PO TABS
1.0000 | ORAL_TABLET | Freq: Three times a day (TID) | ORAL | Status: DC
  Administered 2021-09-12 – 2021-09-14 (×7): 1 via ORAL
  Filled 2021-09-12 (×9): qty 1

## 2021-09-12 MED ORDER — PANTOPRAZOLE SODIUM 40 MG PO TBEC
40.0000 mg | DELAYED_RELEASE_TABLET | Freq: Every day | ORAL | Status: DC
  Administered 2021-09-12 – 2021-09-14 (×3): 40 mg via ORAL
  Filled 2021-09-12 (×3): qty 1

## 2021-09-12 MED ORDER — ONDANSETRON HCL 4 MG PO TABS: 4.0000 mg | ORAL_TABLET | Freq: Four times a day (QID) | ORAL | Status: DC | PRN

## 2021-09-12 NOTE — TOC Progression Note (Signed)
Transition of Care (TOC) - Progression Note  ? ? ?Patient Details  ?Name: Gerald Leach ?MRN: [id number] ?Date of Birth: 07/30/1933 ? ?Transition of Care (TOC) CM/SW Contact  ?Milas Gain, LCSWA ?Phone Number: ?09/12/2021, 5:05 PM ? ?Clinical Narrative:    ? ?CSW awaiting PT/OT recommendations for patient. CSW will continue to follow and assist with patients dc planning needs. ? ?Expected Discharge Plan: [place] ?Barriers to Discharge: Hospice Bed not available ? ?Expected Discharge Plan and Services ?Expected Discharge Plan: [place] ?  ?  ?Post Acute Care Choice: Hospice ?  ?                ?  ?  ?  ?  ?  ?  ?  ?  ?  ?  ? ? ?Social Determinants of Health (SDOH) Interventions ?  ? ?Readmission Risk Interventions ? ?  08/12/2020  ?  1:54 PM  ?Readmission Risk Prevention Plan  ?Post Dischage Appt Complete  ?Medication Screening Complete  ?Transportation Screening Complete  ? ? ?

## 2021-09-12 NOTE — Care Management Important Message (Signed)
Important Message ? ?Patient Details  ?Name: Gerald Leach ?MRN: [id number] ?Date of Birth: December 12, 1933 ? ? ?Medicare Important Message Given:  Yes ? ? ? ? ?Shelda Altes ?09/12/2021, 11:17 AM ?

## 2021-09-12 NOTE — Progress Notes (Signed)
?PROGRESS NOTE ? ? ? Gerald Leach  WIO:[id number] DOB: 04/06/1934 DOA: 09/03/2021 ?PCP: [name], NP  ? ? ?Brief Narrative:  ?Gerald Leach is a 88 y.o. male with medical history significant of Parkinson's disease with dementia, seizure disorder on antiepileptics, hypertension presented to hospital with worsening mental status for a week or two and was minimally verbal for 1 week.  Patient had sudden onset of severe altered mental status at home and EMS was called in.  EMS noted that the patient was in ventricular tachycardia and cardioverted and had the EKG pattern of ST elevation MI.  Patient was then brought into the hospital as a code STEMI.  In the ED, cardiology was consulted and due to his frail status with advanced dementia conservative treatment was pursued.  Patient was a started on amiodarone and heparin drip and was admitted to the hospital for further evaluation and treatment.  During hospitalization, palliative care was consulted and at this time family has decided to proceed with comfort care  ? ? Assessment and Plan: ?Ventricular tachycardia ?Likely secondary to ST elevation MI.  Cardioverted x1.  Patient was initiated on amiodarone drip which was changed to oral amiodarone but currently on comfort care ? ?Acute ST elevation myocardial infarction (STEMI) of anterior wall (HCC) ? STEMI with monomorphic VT in field.  Patient was cardioverted x1 at the field site.  Seen by cardiology here and due to frailty and dementia not a candidate for left heart catheterization.  Patient is DNR.  Patient was on aspirin, Plavix and high intensity Lipitor, low-dose beta-blocker as tolerated,  nitroglycerin and amiodarone .   Was on a heparin drip which was changed to Lovenox. 2D echocardiogram with decreased LV function at 30 to 35% with septal apical and anterior wall hypokinesis.  At this time, patient on comfort care ? ?COVID-19 virus infection ?Patient was having diarrhea and poor functioning at home  likely secondary to COVID-19 infection.  Mild infection without any hypoxia.  Currently on comfort care.  Patient will be off isolation since today. ? ?Dementia due to Parkinson'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 behavioral disturbance ([place]) ?Worsening mental status at home.  Patient was on carbidopa Vimpat Keppra and amantadine at home.  Currently has been transitioned to comfort care ? ?Parkinson disease ([place]) ?Patient was on Sinemet and amantadine from home.  Currently on Vimpat and Keppra. ? ?Diabetes mellitus type 2, controlled ([place]) ?Patient was continued on comfort care during hospitalization.  Hold metformin from home. ? ?Seizure disorder ([place]) ?Continue keppra and vimpat. ? ?Hypomagnesemia ?Was replenished through IV and orally during hospitalization..  Currently on comfort care. ? ?Hypokalemia ?Was replenished. ? ?PAF (paroxysmal atrial fibrillation) ([place]) ?Patient has history of atrial fibrillation but not on anticoagulation as outpatient.  Currently normal sinus rhythm.  Unable to use beta-blocker due to bradycardia. ? ?Hyperlipidemia with target LDL less than 70 ?On comfort care. ? ?Goals of care, counseling/discussion ?Palliative care on board.  Patient has been transitioned to comfort care at this time.  Awaiting for residential hospice  ? ? DVT prophylaxis:   None for comfort ? ? ?Code Status:   ?  Code Status: DNR ? ?Disposition: Residential hospice when bed is available, COVID isolation will be discontinued today. ? ?Status is: Inpatient ? ?Remains inpatient appropriate because: ST elevation MI, comfort care, awaiting for residential hospice ? ? Family Communication:  ?Spoke with the patient's son on 09/07/2021.  Palliative care communicating with the family. ? ?Consultants:  ?Cardiology ?Palliative care ? ?Procedures:  ?  None ? ?Antimicrobials:  ?None ? ?Anti-infectives (From admission, onward)  ? ? None  ? ?  ? ?Subjective: ? ?Today, patient was seen and examined at bedside.  Unable to verbalize much at the time of  my exam.  Denies pain.   ?Objective: ?Vitals:  ? 09/09/21 1945 09/10/21 1432 09/11/21 1748 09/12/21 0545  ?BP: 121/72 121/81 131/78 135/77  ?Pulse: 71 70 97 78  ?Resp: '18 19 20 16  '$ ?Temp: 97.8 ?F (36.6 ?C) 97.7 ?F (36.5 ?C) 97.6 ?F (36.4 ?C) 97.8 ?F (36.6 ?C)  ?TempSrc: Axillary Axillary Axillary Axillary  ?SpO2: 98% 96% 98% 96%  ?Weight:      ?Height:      ? ? ?Intake/Output Summary (Last 24 hours) at 09/12/2021 1043 ?Last data filed at 09/12/2021 5170 ?Gross per 24 hour  ?Intake 620 ml  ?Output 300 ml  ?Net 320 ml  ? ? ?Filed Weights  ? 09/04/21 0037 09/04/21 0612  ?Weight: 64.6 kg 67.4 kg  ? ?Body mass index is 20.15 kg/m?.  ? ?Physical Examination: ? ?General:  Average built, not in obvious distress, lying in bed, ?HENT:   No scleral pallor or icterus noted. Oral mucosa is moist.  ?Chest:  .  Diminished breath sounds bilaterally. ?CVS: S1 &S2 heard. No murmur.  Regular rate and rhythm. ?Abdomen: Soft, nontender, nondistended.  Bowel sounds are heard.   ?Extremities: No cyanosis, clubbing or edema.  Peripheral pulses are palpable. ?Psych: Alert, awake but mildly communicative today.  Oriented to place. ?CNS: Moving all extremities. ?Skin: Warm and dry.  No rashes noted. ? ? ?Data Reviewed:  ? ?CBC: ?No results for input(s): WBC, NEUTROABS, HGB, HCT, MCV, PLT in the last 168 hours. ? ? ?Basic Metabolic Panel: ?No results for input(s): NA, K, CL, CO2, GLUCOSE, BUN, CREATININE, CALCIUM, MG, PHOS in the last 168 hours. ? ? ?Liver Function Tests: ?No results for input(s): AST, ALT, ALKPHOS, BILITOT, PROT, ALBUMIN in the last 168 hours. ? ? ?Radiology Studies: ?No results found. ? ? ? LOS: 9 days  ? ? ?Flora Lipps, MD ?[facility] ?09/12/2021, 10:43 AM  ? ? ?

## 2021-09-12 NOTE — Plan of Care (Signed)
?  Problem: Activity: ?Goal: Ability to return to baseline activity level will improve ?Outcome: Not Applicable ?  ?Problem: Cardiovascular: ?Goal: Vascular access site(s) Level 0-1 will be maintained ?Outcome: Not Applicable ?  ?

## 2021-09-12 NOTE — Progress Notes (Signed)
[facility]) Hospital Liaison Note ? ?Continuing to follow patient while on isolation through 3.28.2023. At this time, patient does not appear to be [facility] appropriate, will evaluate for definitive decision tomorrow. ? ?Spoke with son [name] to inform him of assessment. All questions answered. [name] shares that he would want patient to be evaluated for rehab at discharge and to also be followed by outpatient palliative care if not eligible for [facility]. ? ?Call made to inform [facility] with TOC. ? ?Please call with any hospice questions or concerns. ? ?Thank you, ?Margaretmary Eddy, BSN, RN ?[facility] Liaison ?[phone] ?

## 2021-09-12 NOTE — Progress Notes (Addendum)
Patient Gerald Leach      DOB: 05-11-1934      HDQ:[id number] ? ? ? ?  ?Palliative Medicine Team ? ? ? ?Subjective: Bedside symptom check. No family or other visitors present at time of visit.  ? ? ?Physical exam: Patient alert and sitting up in bed at time of visit. Patient made and maintained eye contact throughout our visit. He was appropriately conversational and eating a banana. Patient denies any pain or discomfort at this time. He displayed no physical or non-verbal signs of pain or discomfort as well. His breathing was even and non-labored, no excessive secretions noted. He reciprocated, telling this RN to "have a great day."  ? ? ?Assessment and plan: Bedside RN without any concerns or needs at this time. Very pleasant visit with markedly increased participation compared to last week. Will continue to follow for needs, changes, or advances for this patient.  ? ? ?Thank you for allowing the Palliative Medicine Team to assist in the care of this patient. ?  ?  ?Damian Leavell, MSN, RN ?Palliative Medicine Team ?Team Phone: [phone]  ?This phone is monitored 7a-7p, please reach out to attending physician outside of these hours for urgent needs.   ?

## 2021-09-13 MED ORDER — LEVETIRACETAM 500 MG PO TABS
1500.0000 mg | ORAL_TABLET | Freq: Two times a day (BID) | ORAL | Status: DC
  Administered 2021-09-13 – 2021-09-14 (×2): 1500 mg via ORAL
  Filled 2021-09-13 (×2): qty 3

## 2021-09-13 MED ORDER — ASPIRIN EC 81 MG PO TBEC
81.0000 mg | DELAYED_RELEASE_TABLET | Freq: Every day | ORAL | Status: DC
  Administered 2021-09-13 – 2021-09-14 (×2): 81 mg via ORAL
  Filled 2021-09-13 (×2): qty 1

## 2021-09-13 MED ORDER — LACOSAMIDE 50 MG PO TABS
150.0000 mg | ORAL_TABLET | Freq: Two times a day (BID) | ORAL | Status: DC
  Administered 2021-09-13 – 2021-09-14 (×2): 150 mg via ORAL
  Filled 2021-09-13 (×2): qty 3

## 2021-09-13 MED ORDER — ATORVASTATIN CALCIUM 40 MG PO TABS
40.0000 mg | ORAL_TABLET | Freq: Every day | ORAL | Status: DC
  Administered 2021-09-13 – 2021-09-14 (×2): 40 mg via ORAL
  Filled 2021-09-13 (×2): qty 1

## 2021-09-13 MED ORDER — AMANTADINE HCL 100 MG PO CAPS
100.0000 mg | ORAL_CAPSULE | Freq: Every day | ORAL | Status: DC
  Administered 2021-09-13: 100 mg via ORAL
  Filled 2021-09-13 (×2): qty 1

## 2021-09-13 MED ORDER — AMIODARONE HCL 200 MG PO TABS
200.0000 mg | ORAL_TABLET | Freq: Every day | ORAL | Status: DC
  Administered 2021-09-13 – 2021-09-14 (×2): 200 mg via ORAL
  Filled 2021-09-13 (×2): qty 1

## 2021-09-13 MED ORDER — CLOPIDOGREL BISULFATE 75 MG PO TABS
75.0000 mg | ORAL_TABLET | Freq: Every day | ORAL | Status: DC
  Administered 2021-09-13 – 2021-09-14 (×2): 75 mg via ORAL
  Filled 2021-09-13 (×2): qty 1

## 2021-09-13 NOTE — TOC Progression Note (Addendum)
Transition of Care (TOC) - Progression Note  ? ? ?Patient Details  ?Name: Gerald Leach ?MRN: [id number] ?Date of Birth: Mar 30, 1934 ? ?Transition of Care (TOC) CM/SW Contact  ?Milas Gain, LCSWA ?Phone Number: ?09/13/2021, 11:59 AM ? ?Clinical Narrative:    ? ?Update- CSW provided patients son [name] with SNF bed offers. Patients son chose SNF placement at [facility]. CSW spoke with Sharyn Lull at [facility] who confirmed SNF bed for patient. Sharyn Lull confirmed she can accept patient for dc tomorrow if medically ready. Insurance authorization currently pending. ? ?Update- CSW started insurance authorization for patient. Reference number is # [id number].  ? ? ?CSW received consult for possible SNF placement at time of discharge. Due to patients orientation CSW discussed patients dc plan with patients son [name].Patients son expressed understanding of PT recommendation for patient and is agreeable to SNF placement at time of discharge. Patients son gave CSW permission to fax out initial referral near the [place] area.Patients son first choice for patient is [facility].CSW discussed insurance authorization process with patients son [name].Patients son [name] reports patient has received the COVID vaccines.CSW received consult from MD to make referral for palliative services to follow patient at SNF. CSW spoke with patients son [name] who gave CSW permission to make referral to [facility] for palliative services to follow patient at SNF. CSW called [facility] and spoke with [place] and made referral for palliative services to follow patient at SNF.  No further questions reported at this time. CSW to continue to follow and assist with discharge planning needs.  ? ?Expected Discharge Plan: [place] ?Barriers to Discharge: Continued Medical Work up ? ?Expected Discharge Plan and Services ?Expected Discharge Plan: [place] ?In-house Referral: Clinical Social Work ?  ?Post Acute Care Choice:  Hospice ?Living arrangements for the past 2 months: [place] ?                ?  ?  ?  ?  ?  ?  ?  ?  ?  ?  ? ? ?Social Determinants of Health (SDOH) Interventions ?  ? ?Readmission Risk Interventions ? ?  08/12/2020  ?  1:54 PM  ?Readmission Risk Prevention Plan  ?Post Dischage Appt Complete  ?Medication Screening Complete  ?Transportation Screening Complete  ? ? ?

## 2021-09-13 NOTE — Progress Notes (Signed)
IV taken out by pt. Pt not cooperative with staff at this time to obtain another IV access. Will reevaluate later in shift.  ?

## 2021-09-13 NOTE — NC FL2 (Signed)
?[place] MEDICAID FL2 LEVEL OF CARE SCREENING TOOL  ?  ? ?IDENTIFICATION  ?Patient Name: ?Gerald Leach Birthdate: 12/02/1933 Sex: male Admission Date (Current Location): ?09/03/2021  ?[place] and [place] Number: ? [place] ?  Facility and Address:  ?[facility] [place] [address], [place], [place] [address] ?     Provider Number: ?[id number]  ?Attending Physician Name and Address:  ?Kathie Dike, MD ? Relative Name and Phone Number:  ?[name] [phone] ?   ?Current Level of Care: ?Hospital Recommended Level of Care: ?[place] Prior Approval Number: ?  ? ?Date Approved/Denied: ?  PASRR Number: ?[id number] A ? ?Discharge Plan: ?SNF ?  ? ?Current Diagnoses: ?Patient Active Problem List  ? Diagnosis Date Noted  ? Hypokalemia 09/05/2021  ? Hypomagnesemia 09/05/2021  ? ST elevation myocardial infarction (STEMI) ([place])   ? Goals of care, counseling/discussion   ? PAF (paroxysmal atrial fibrillation) ([place]) 09/04/2021  ? Acute ST elevation myocardial infarction (STEMI) of anterior wall ([place]) 09/03/2021  ? Ventricular tachycardia 09/03/2021  ? COVID-19 virus infection 09/03/2021  ? Breakthrough seizure ([place]) 03/19/2021  ? Seizure ([place]) 03/18/2021  ? Dementia due to Parkinson'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 behavioral disturbance ([place]) 08/16/2020  ? Fall 08/11/2020  ? Closed compression fracture of L5 lumbar vertebra, initial encounter ([place]) 08/11/2020  ? Seizures ([place]) 03/05/2018  ? Leukocytosis 03/05/2018  ? Diabetic foot ulcers ([place]) 04/23/2017  ? Parkinson disease ([place])   ? Gait instability   ? Diabetes mellitus type 2, controlled ([place]) 08/05/2015  ? Hypothyroidism 08/05/2015  ? Fall at home 10/02/2014  ? CNS aneurysm s/p coiling Rob Hickman, 2002) 10/02/2014  ? High grade T7 central canal stenosis with T7 vertebral fracture s/p fall (02/2012) 10/02/2014  ? 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 10/02/2014  ? Syncope 02/05/2014  ? Drug-induced  delirium(292.81) 05/16/2013  ? Seizure disorder ([place]) 04/21/2013  ? Seizure disorder, grand mal ([place]) 03/24/2013  ? Cervical compression fracture---C7 10/29/2012  ? Dysphagia 09/21/2012  ? Subdural hematoma 09/20/2012  ? SOLAR KERATOSIS 02/17/2009  ? CARCINOMA, SKIN, SQUAMOUS CELL, FACE 04/19/2008  ? Hyperlipidemia with target LDL less than 70 01/27/2008  ? COLONIC POLYPS 11/15/2006  ? GERD 11/15/2006  ? ? ?Orientation RESPIRATION BLADDER Height & Weight   ?  ?Self ? Normal Incontinent, External catheter (External Urinary Catheter) Weight: 148 lb 9.4 oz (67.4 kg) ?Height:  6' (182.9 cm)  ?BEHAVIORAL SYMPTOMS/MOOD NEUROLOGICAL BOWEL NUTRITION STATUS  ?    Incontinent Diet (Please see discharge summary)  ?AMBULATORY STATUS COMMUNICATION OF NEEDS Skin   ?Extensive Assist Verbally Other (Comment) (WDL,Appropriate for ethnicity, dry,intact) ?  ?  ?  ?    ?     ?     ? ? ?Personal Care Assistance Level of Assistance  ?Bathing, Feeding, Dressing Bathing Assistance: Maximum assistance ?Feeding assistance: Limited assistance (Needs assist) ?Dressing Assistance: Maximum assistance ?   ? ?Functional Limitations Info  ?Sight, Hearing, Speech Sight Info: Adequate ?Hearing Info: Adequate ?Speech Info: Adequate  ? ? ?SPECIAL CARE FACTORS FREQUENCY  ?PT (By licensed PT), OT (By licensed OT)   ?  ?PT Frequency: 5x min weekly ?OT Frequency: 5x min weekly ?  ?  ?  ?   ? ? ?Contractures Contractures Info: Not present  ? ? ?Additional Factors Info  ?Code Status, Allergies Code Status Info: DNR ?Allergies Info: No Known Allergies ?  ?  ?  ?   ? ?Current Medications (09/13/2021):  This is the current hospital  active medication list ?Current Facility-Administered Medications  ?Medication Dose Route Frequency Provider Last Rate Last Admin  ? 0.9 %  sodium chloride infusion   Intravenous PRN Pokhrel, Laxman, MD   Stopped at 09/09/21 2332  ? acetaminophen (TYLENOL) tablet 650 mg  650 mg Oral Q4H PRN Etta Quill, DO   650 mg at 09/09/21  1658  ? antiseptic oral rinse (BIOTENE) solution 15 mL  15 mL Topical PRN Cooper, Josseline P, PA-C      ? bisacodyl (DULCOLAX) suppository 10 mg  10 mg Rectal Daily PRN Cooper, Josseline P, PA-C      ? carbidopa-levodopa (SINEMET IR) 25-100 MG per tablet immediate release 1 tablet  1 tablet Oral TID Pokhrel, Laxman, MD   1 tablet at 09/13/21 1140  ? glycopyrrolate (ROBINUL) tablet 1 mg  1 mg Oral Q4H PRN Cooper, Josseline P, PA-C      ? Or  ? glycopyrrolate (ROBINUL) injection 0.2 mg  0.2 mg Subcutaneous Q4H PRN Cooper, Josseline P, PA-C      ? Or  ? glycopyrrolate (ROBINUL) injection 0.2 mg  0.2 mg Intravenous Q4H PRN Cooper, Josseline P, PA-C      ? lacosamide (VIMPAT) 150 mg in sodium chloride 0.9 % 25 mL IVPB  150 mg Intravenous [address], [place] 80 mL/hr at 09/12/21 2220 150 mg at 09/12/21 2220  ? levETIRAcetam (KEPPRA) IVPB 1500 mg/ 100 mL premix  1,500 mg Intravenous [address], [place] 400 mL/hr at 09/13/21 1152 1,500 mg at 09/13/21 1152  ? levothyroxine (SYNTHROID) tablet 75 mcg  75 mcg Oral Q0600 Flora Lipps, MD   75 mcg at 09/13/21 0604  ? LORazepam (ATIVAN) tablet 1 mg  1 mg Oral Q2H PRN Cooper, Josseline P, PA-C   1 mg at 09/09/21 1658  ? Or  ? LORazepam (ATIVAN) 2 MG/ML concentrated solution 1 mg  1 mg Sublingual Q2H PRN Cooper, Josseline P, PA-C      ? Or  ? LORazepam (ATIVAN) injection 1 mg  1 mg Intravenous Q2H PRN Cooper, Josseline P, PA-C   1 mg at 09/07/21 1250  ? morphine (PF) 2 MG/ML injection 2 mg  2 mg Intravenous Q2H PRN Cooper, Josseline P, PA-C   2 mg at 09/06/21 0005  ? morphine (PF) 2 MG/ML injection 2 mg  2 mg Intravenous Q4H Iona Beard, MD   2 mg at 09/13/21 1139  ? morphine CONCENTRATE 10 MG/0.5ML oral solution 5 mg  5 mg Oral Q2H PRN Cooper, Josseline P, PA-C   5 mg at 09/06/21 2252  ? Or  ? morphine CONCENTRATE 10 MG/0.5ML oral solution 5 mg  5 mg Sublingual Q2H PRN Cooper, Josseline P, PA-C      ? nitroGLYCERIN (NITROSTAT) SL tablet 0.4 mg  0.4 mg  Sublingual Q5 Min x 3 PRN Etta Quill, DO      ? ondansetron [facility]) injection 4 mg  4 mg Intravenous Q6H PRN Etta Quill, DO      ? ondansetron (ZOFRAN) tablet 4 mg  4 mg Oral Q6H PRN Pokhrel, Laxman, MD      ? pantoprazole (PROTONIX) EC tablet 40 mg  40 mg Oral Daily Pokhrel, Laxman, MD   40 mg at 09/13/21 1140  ? polyvinyl alcohol (LIQUIFILM TEARS) 1.4 % ophthalmic solution 1 drop  1 drop Both Eyes QID PRN Cooper, Josseline P, PA-C      ? ? ? ?Discharge Medications: ?Please see discharge summary for a list  of discharge medications. ? ?Relevant Imaging Results: ? ?Relevant Lab Results: ? ? ?Additional Information ?[id number], Both Covid Vacccines ? ?Milas Gain, LCSWA ? ? ? ? ?

## 2021-09-13 NOTE — Evaluation (Signed)
Physical Therapy Evaluation ?Patient Details ?Name: Gerald Leach ?MRN: [id number] ?DOB: 07-24-1933 ?Today's Date: 09/13/2021 ? ?History of Present Illness ? This 88 y.o. male admitted with worsening mental status.  He was found to be in [place] and was cardioverted back to NSR.  Dx.  STEMI, COVID +.  PMH includes:  Parkinson's disease, DM, demntia, seizure disorder, HTN ?  ?Clinical Impression ? Pt in bed upon arrival of PT, agreeable to evaluation at this time. The pt was unable to answer questions about PLOF, but per chart review, he has had rapid decline since his spouse passed away earlier this year. The pt required totalA of 2 to complete transition to sitting EOB, and is significantly limited by fear of falling despite attempts to support and redirect the pt. He was unable to maintain static sitting without total posterior support due to strong posterior pushing and truncal extension, asking to lie back down after ~5 min sitting EOB. The pt would require significant assist of 2 to return home at this time, therefore recommend SNF for increased assist from staff given current mobility deficits. Will continue to benefit from skilled PT to progress independence and capacity for transfers to decrease burden on family if pt were to d/c home with hospice after stay at SNF.  ?   ? ?Recommendations for follow up therapy are one component of a multi-disciplinary discharge planning process, led by the attending physician.  Recommendations may be updated based on patient status, additional functional criteria and insurance authorization. ? ?Follow Up Recommendations Skilled nursing-short term rehab (<3 hours/day) ? ?  ?Assistance Recommended at Discharge Frequent or constant Supervision/Assistance  ?Patient can return home with the following ? Two people to help with walking and/or transfers;Two people to help with bathing/dressing/bathroom;Assistance with cooking/housework;Assistance with feeding;Direct supervision/assist  for medications management;Direct supervision/assist for financial management;Assist for transportation;Help with stairs or ramp for entrance ? ?  ?Equipment Recommendations Wheelchair (measurements PT);Wheelchair cushion (measurements PT);BSC/3in1;Hospital bed (hoyer lift)  ?Recommendations for Other Services ?    ?  ?Functional Status Assessment Patient has had a recent decline in their functional status and demonstrates the ability to make significant improvements in function in a reasonable and predictable amount of time.  ? ?  ?Precautions / Restrictions Precautions ?Precautions: Fall ?Precaution Comments: heavy posterior bias.  Fearful of falling ?Restrictions ?Weight Bearing Restrictions: No  ? ?  ? ?Mobility ? Bed Mobility ?Overal bed mobility: Needs Assistance ?Bed Mobility: Rolling, Sidelying to Sit, Sit to Supine ?Rolling: Max assist, +2 for physical assistance, +2 for safety/equipment ?Sidelying to sit: Total assist, +2 for physical assistance, +2 for safety/equipment ?  ?Sit to supine: Max assist, +2 for physical assistance, +2 for safety/equipment ?  ?General bed mobility comments: Pt requires assist for all aspects due to motor planning deficits and fear of movement.  He initially demonstrated significant rigidity of LEs and trunk.  once pt returned to supine, he was able to assist with rolling with max cues to use bedrails - max A required ?  ? ?Transfers ?  ?  ?  ?  ?  ?  ?  ?  ?  ?General transfer comment: unable to attempt ?  ? ?  ? ?  ? ?Balance Overall balance assessment: Needs assistance ?Sitting-balance support: Feet supported, Bilateral upper extremity supported ?Sitting balance-Leahy Scale: Zero ?Sitting balance - Comments: Pt with heavy posterior lean.  Required max - total A for EOB sitting ?  ?  ?  ?  ?  ?  ?  ?  ?  ?  ?  ?  ?  ?  ?  ?   ? ? ? ?  Pertinent Vitals/Pain Pain Assessment ?Pain Assessment: No/denies pain  ? ? ?Home Living Family/patient expects to be discharged to:: Private  residence ?  ?  ?  ?  ?  ?  ?  ?  ?  ?Additional Comments: no family available.  ?  ?Prior Function   ?  ?  ?  ?  ?  ?  ?  ?ADLs Comments: Per chart review, pt's spouse died in 07-04-2021.  Pt has had a progressive decline in function for the past several years, but worsened decline in past several months ?  ? ? ?Hand Dominance  ? Dominant Hand: Right ? ?  ?Extremity/Trunk Assessment  ? Upper Extremity Assessment ?Upper Extremity Assessment: Defer to OT evaluation ?  ? ?Lower Extremity Assessment ?Lower Extremity Assessment: Generalized weakness;Difficult to assess due to impaired cognition (pt fearful of falling and resistant to MMT or attempts to stand, resisting movement at times with decent strength but minimal functional use of LE to assist with movement noted) ?  ? ?Cervical / Trunk Assessment ?Cervical / Trunk Assessment: Other exceptions (heavy posterior bias.   Resists movement at times.  truncal rigidity noted)  ?Communication  ? Communication: HOH  ?Cognition Arousal/Alertness: Awake/alert, Lethargic ?Behavior During Therapy: Anxious, Agitated ?Overall Cognitive Status: No family/caregiver present to determine baseline cognitive functioning ?  ?  ?  ?  ?  ?  ?  ?  ?  ?  ?  ?  ?  ?  ?  ?  ?General Comments: Pt initially lethargic, but awakens to voice.  He follows intermittent commands with cues and gestures.  He was limited with activity due to being very fearful of falling and wanting to lie back down ?  ?  ? ?  ?General Comments General comments (skin integrity, edema, etc.): VSS on RA ? ?  ?Exercises    ? ?Assessment/Plan  ?  ?PT Assessment Patient needs continued PT services  ?PT Problem List Decreased strength;Decreased range of motion;Decreased activity tolerance;Decreased balance;Decreased mobility;Decreased coordination;Decreased cognition;Decreased safety awareness ? ?   ?  ?PT Treatment Interventions DME instruction;Gait training;Functional mobility training;Therapeutic activities;Balance  training;Therapeutic exercise;Patient/family education   ? ?PT Goals (Current goals can be found in the Care Plan section)  ?Acute Rehab PT Goals ?Patient Stated Goal: to lie back down ?PT Goal Formulation: With patient ?Time For Goal Achievement: 09/27/21 ?Potential to Achieve Goals: Poor ? ?  ?Frequency Min 2X/week ?  ? ? ?Co-evaluation PT/OT/SLP Co-Evaluation/Treatment: Yes ?Reason for Co-Treatment: Necessary to address cognition/behavior during functional activity;For patient/therapist safety;To address functional/ADL transfers ?PT goals addressed during session: Mobility/safety with mobility;Strengthening/ROM ?  ?  ? ? ?  ?AM-PAC PT "6 Clicks" Mobility  ?Outcome Measure Help needed turning from your back to your side while in a flat bed without using bedrails?: Total ?Help needed moving from lying on your back to sitting on the side of a flat bed without using bedrails?: Total ?Help needed moving to and from a bed to a chair (including a wheelchair)?: Total ?Help needed standing up from a chair using your arms (e.g., wheelchair or bedside chair)?: Total ?Help needed to walk in hospital room?: Total ?Help needed climbing 3-5 steps with a railing? : Total ?6 Click Score: 6 ? ?  ?End of Session   ?Activity Tolerance: Patient limited by pain;Treatment limited secondary to agitation ?Patient left: in bed;with call bell/phone within reach;with bed alarm set ?Nurse Communication: Mobility status ?PT Visit Diagnosis: Other abnormalities of gait and mobility (R26.89);Muscle weakness (generalized) (  M62.81);Adult, failure to thrive (R62.7) ?  ? ?Time: 1537-9432 ?PT Time Calculation (min) (ACUTE ONLY): 14 min ? ? ?Charges:   PT Evaluation ?$PT Eval Moderate Complexity: 1 Mod ?  ?  ?   ? ? ?West Carbo, PT, DPT  ? ?Acute Rehabilitation Department ?Pager #: [phone] - [phone] ? ?Sandra Cockayne ?09/13/2021, 11:21 AM ? ?

## 2021-09-13 NOTE — Evaluation (Signed)
Occupational Therapy Evaluation ?Patient Details ?Name: Gerald Leach ?MRN: [id number] ?DOB: 1934/01/24 ?Today's Date: 09/13/2021 ? ? ?History of Present Illness This 88 y.o. male admitted with worsening mental status.  He was found to be in [place] and was cardioverted back to [facility].  Dx.  STEMI, COVID +.  PMH includes:  Parkinson's disease, DM, demntia, seizure disorder, HTN  ? ?Clinical Impression ?  ?Pt admitted with above. He demonstrates the below listed deficits and will benefit from continued OT to maximize safety and independence with BADLs.  Pt presents to OT with impaired cognition, decreased activity tolerance, impaired balance, generalized weakness, and motor planning deficits.  He currently requires max - total A for ADLs, and total A for functional mobility.  He required total A +2 to move to EOB sitting, and required max - total A to sit EOB - he is very fearful of falling and has a severe posterior lean.  Unable to progress him to standing or transfer OOB.  He was very difficult to engage in activity due to fear of falling.  Pt will require 24 hour physical assist at discharge.  If he discharges home, recommend hospital bed, hoyer lift, w/c, BSC, and ambulance transfer.  Will follow acutely.   ?  ?   ? ?Recommendations for follow up therapy are one component of a multi-disciplinary discharge planning process, led by the attending physician.  Recommendations may be updated based on patient status, additional functional criteria and insurance authorization.  ? ?Follow Up Recommendations ? Skilled nursing-short term rehab (<3 hours/day)  ?  ?Assistance Recommended at Discharge Frequent or constant Supervision/Assistance  ?Patient can return home with the following Two people to help with walking and/or transfers;A lot of help with bathing/dressing/bathroom;Assistance with feeding;Direct supervision/assist for medications management;Direct supervision/assist for financial management;Assist for  transportation;Help with stairs or ramp for entrance ? ?  ?Functional Status Assessment ? Patient has had a recent decline in their functional status and demonstrates the ability to make significant improvements in function in a reasonable and predictable amount of time.  ?Equipment Recommendations ? BSC/3in1;Wheelchair (measurements OT);Hospital bed;Other (comment) (hoyer lift)  ?  ?Recommendations for Other Services   ? ? ?  ?Precautions / Restrictions Precautions ?Precautions: Fall ?Precaution Comments: heavy posterior bias.  Fearful of falling  ? ?  ? ?Mobility Bed Mobility ?Overal bed mobility: Needs Assistance ?Bed Mobility: Rolling, Sidelying to Sit, Sit to Supine ?Rolling: Max assist, +2 for physical assistance, +2 for safety/equipment ?Sidelying to sit: Total assist, +2 for physical assistance, +2 for safety/equipment ?  ?Sit to supine: Max assist, +2 for physical assistance, +2 for safety/equipment ?  ?General bed mobility comments: Pt requires assist for all aspects due to motor planning deficits and fear of movement.  He initially demonstrated significant rigidity of LEs and trunk.  once pt returned to supine, he was able to assist with rolling with max cues to use bedrails - max A required ?  ? ?Transfers ?  ?  ?  ?  ?  ?  ?  ?  ?  ?General transfer comment: unable to attempt ?  ? ?  ?Balance Overall balance assessment: Needs assistance ?Sitting-balance support: Feet supported, Bilateral upper extremity supported ?Sitting balance-Leahy Scale: Zero ?Sitting balance - Comments: Pt with heavy posterior lean.  Required max - total A for EOB sitting ?  ?  ?  ?  ?  ?  ?  ?  ?  ?  ?  ?  ?  ?  ?  ?   ? ?  ADL either performed or assessed with clinical judgement  ? ?ADL Overall ADL's : Needs assistance/impaired ?Eating/Feeding: Maximal assistance;Bed level ?  ?Grooming: Wash/dry hands;Wash/dry face;Oral care;Brushing hair;Maximal assistance;Bed level ?  ?Upper Body Bathing: Total assistance;Bed level ?  ?Lower  Body Bathing: Total assistance;Bed level ?  ?Upper Body Dressing : Total assistance;Bed level ?  ?Lower Body Dressing: Total assistance;Bed level ?  ?Toilet Transfer: Total assistance ?Toilet Transfer Details (indicate cue type and reason): unable ?Toileting- Clothing Manipulation and Hygiene: Total assistance;Bed level ?  ?  ?  ?Functional mobility during ADLs: Total assistance;Maximal assistance;+2 for physical assistance;+2 for safety/equipment ?General ADL Comments: Pt very limited with engagement in activity due to fear of falling, and desire to return to supine.  ? ? ? ?Vision   ?Additional Comments: NT  ?   ?Perception   ?  ?Praxis   ?  ? ?Pertinent Vitals/Pain Pain Assessment ?Pain Assessment: No/denies pain  ? ? ? ?Hand Dominance Right ?  ?Extremity/Trunk Assessment Upper Extremity Assessment ?Upper Extremity Assessment: Overall WFL for tasks assessed (grossly assessed) ?  ?Lower Extremity Assessment ?Lower Extremity Assessment: Defer to PT evaluation ?  ?Cervical / Trunk Assessment ?Cervical / Trunk Assessment: Other exceptions (heavy posterior bias.   Resists movement at times.  truncal rigidity noted) ?  ?Communication Communication ?Communication: HOH ?  ?Cognition Arousal/Alertness: Awake/alert, Lethargic ?Behavior During Therapy: Anxious, Agitated ?Overall Cognitive Status: No family/caregiver present to determine baseline cognitive functioning ?  ?  ?  ?  ?  ?  ?  ?  ?  ?  ?  ?  ?  ?  ?  ?  ?General Comments: Pt initially lethargic, but awakens to voice.  He follows intermittent commands with cues and gestures.  He was limited with activity due to being very fearful of falling and wanting to lie back down ?  ?  ?General Comments    ? ?  ?Exercises   ?  ?Shoulder Instructions    ? ? ?Home Living Family/patient expects to be discharged to:: Private residence ?  ?  ?  ?  ?  ?  ?  ?  ?  ?  ?  ?  ?  ?  ?  ?  ?Additional Comments: no family available. ?  ? ?  ?Prior Functioning/Environment   ?  ?  ?  ?  ?   ?  ?  ?ADLs Comments: Per chart review, pt's spouse died in 2021/07/14.  Pt has had a progressive decline in function for the past several years, but worsened decline in past several months ?  ? ?  ?  ?OT Problem List: Decreased strength;Decreased activity tolerance;Impaired balance (sitting and/or standing);Decreased cognition;Decreased safety awareness;Decreased knowledge of use of DME or AE ?  ?   ?OT Treatment/Interventions: Self-care/ADL training;DME and/or AE instruction;Therapeutic activities;Cognitive remediation/compensation;Patient/family education;Balance training  ?  ?OT Goals(Current goals can be found in the care plan section) Acute Rehab OT Goals ?OT Goal Formulation: Patient unable to participate in goal setting ?Time For Goal Achievement: 09/27/21 ?Potential to Achieve Goals: Fair ?ADL Goals ?Pt Will Perform Eating: with min assist;sitting ?Pt Will Perform Grooming: with mod assist;sitting ?Pt Will Transfer to Toilet: with mod assist;squat pivot transfer;bedside commode  ?OT Frequency: Min 2X/week ?  ? ?Co-evaluation   ?  ?  ?  ?  ? ?  ?AM-PAC OT "6 Clicks" Daily Activity     ?Outcome Measure Help from another person eating meals?: A Lot ?Help from another person taking  care of personal grooming?: A Lot ?Help from another person toileting, which includes using toliet, bedpan, or urinal?: Total ?Help from another person bathing (including washing, rinsing, drying)?: Total ?Help from another person to put on and taking off regular upper body clothing?: Total ?Help from another person to put on and taking off regular lower body clothing?: Total ?6 Click Score: 8 ?  ?End of Session Nurse Communication: Mobility status ? ?Activity Tolerance: Other (comment) (cognition and fear of falling) ?Patient left: in bed;with call bell/phone within reach;with bed alarm set ? ?OT Visit Diagnosis: Unsteadiness on feet (R26.81);Cognitive communication deficit (R41.841)  ?              ?Time: 1947-1252 ?OT Time  Calculation (min): 23 min ?Charges:  OT General Charges ?$OT Visit: 1 Visit ?OT Evaluation ?$OT Eval Moderate Complexity: 1 Mod ? ?Efrem Pitstick C., OTR/L ?Acute Rehabilitation Services ?Pager [phone] ?Office [phone] ? ? ?Co

## 2021-09-13 NOTE — Progress Notes (Addendum)
Patient Gerald Leach      DOB: September 16, 1933      EXB:[id number] ? ? ? ?  ?Palliative Medicine Team ? ? ?Subjective: Bedside symptom check. No family or visitors present at time of visit. This RN collaborated with PT/OT prior to visit.  ? ? ?Physical exam: On entering patient's room, patient resting in bed with eyes closed on side. Patient easily aroused with gentle verbal cue. Patient with brief eye contact and agreement to talk with this RN. Breathing even and non-labored at this time, no excessive secretions noted. No physical or non-verbal signs of pain or discomfort observed. ? ? ?Assessment and plan: This RN asked how he was feeling this morning and he said "feeling alright." He verbally denied any pain or discomfort at this time, just that he is tired. This RN introduced that he will be working with PT and OT and he was agreeable. Will follow up after PT/OT assessment and patient is more alert to participate in conversations.  ? ? ?Addendum: Patient alert and conversational this afternoon. Resting with eyes open in bed. Nursing staff reports he is eating well today, two meals so far. They report he is interactive but disoriented. Will continue to follow for any changes or advances in care plan. ? ?Thank you for allowing the Palliative Medicine Team to assist in the care of this patient. ?  ?  ?Damian Leavell, MSN, RN ?Palliative Medicine Team ?Team Phone: [phone]  ?This phone is monitored 7a-7p, please reach out to attending physician outside of these hours for urgent needs.   ?

## 2021-09-13 NOTE — Progress Notes (Signed)
Manufacturing [employer] [facility]) Hospital Liaison Note ? ?Patient has been evaluated at bedside and chart reviewed by [facility] physician.  ? ?Unfortunately, patient does not meet criteria for [facility] at this time.  ? ?Plan is for patient to go to SNF for rehab and [facility] palliative will follow.  ? ?Please call with any questions or concerns. Thank you ? ?Roselee Nova, LCSW ?[facility] Hospital Liaison ?[phone]. ?

## 2021-09-13 NOTE — Progress Notes (Signed)
Pt took scheduled PO meds and Ativan 1 mg given PO as well. Pt still not willing to allow staff to move pt. Will reassess if pt will allow staff to place a new IV site.  ?

## 2021-09-13 NOTE — Progress Notes (Signed)
?Progress Note ? ? ?Patient: Gerald Leach TDH:[id number] DOB: 1934-04-06 DOA: 09/03/2021     10 ?DOS: the patient was seen and examined on 09/13/2021 ?  ?Brief hospital course: ?JILBERTO VANDERWALL is a 88 y.o. male with medical history significant of Parkinson's disease with dementia, seizure disorder on antiepileptics, hypertension presented to hospital with worsening mental status for a week or two and was minimally verbal for 1 week.  Patient had sudden onset of severe altered mental status at home and EMS was called in.  EMS noted that the patient was in ventricular tachycardia and cardioverted and had the EKG pattern of ST elevation MI.  Patient was then brought into the hospital as a code STEMI.  In the ED, cardiology was consulted and due to his frail status with advanced dementia conservative treatment was pursued.  Patient was a started on amiodarone and heparin drip and was admitted to the hospital for further evaluation and treatment.  During hospitalization, palliative care was consulted and after having goals of care discussion, family initially elected comfort measures.  Since that time, his overall condition/mental status appears to have improved.  Oral intake also improving.  He does not appear to be candidate for residential hospice.  Plans are to discharge to skilled nursing facility for rehab with hospice/palliative care to follow.  His cardiac meds will be restarted for now. ? ?Assessment and Plan: ?Ventricular tachycardia ?Likely secondary to ST elevation MI.  Cardioverted x1.  Patient was initiated on amiodarone drip which was changed to oral amiodarone ? ?Acute ST elevation myocardial infarction (STEMI) of anterior wall (HCC) ? STEMI with monomorphic VT in field.  Patient was cardioverted x1 at the field site.  Seen by cardiology here and due to frailty and dementia not a candidate for left heart catheterization.  Patient is DNR.  Patient is on aspirin, Plavix and high intensity Lipitor, low-dose  beta-blocker as tolerated,  nitroglycerin and amiodarone . Was on a heparin drip which was changed to Lovenox for total of 72hr. 2D echocardiogram with decreased LV function at 30 to 35% with septal apical and anterior wall hypokinesis.   ? ?COVID-19 virus infection ?Patient was having diarrhea and poor functioning at home likely secondary to COVID-19 infection.  Mild infection without any hypoxia.  No longer on isolation ? 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  Patient was on carbidopa Vimpat Keppra and amantadine at home.  Family reports that overall mental status has improved and is actually better than baseline ? ?Parkinson disease ([place]) ?Patient is on Sinemet and amantadine from home.  ? ?Diabetes mellitus type 2, controlled ([place]) ?We will plan on resuming metformin on discharge ? ?Seizure disorder ([place]) ?Continue keppra and vimpat. ? ?Goals of care, counseling/discussion ?Palliative care on board.   ?Goals of care discussion had with patient's family when his overall condition appears to be more critical.  He was less responsive at that time. ?Since that time, he is mentation has improved, he is interacting and oral intake improving.  Does not appear to be a candidate for residential hospice at this time ?Plans are for patient to go to skilled nursing facility for rehab with palliative/hospice to follow. ? ?Hypomagnesemia ?Was replenished through IV and orally.   ? ?Hypokalemia ?Was replenished. ? ?PAF (paroxysmal atrial fibrillation) ([place]) ?Patient has history of atrial fibrillation but not on anticoagulation as outpatient.  Currently normal sinus rhythm.  Unable to use beta-blocker due to bradycardia. ? ?Hyperlipidemia  with target LDL less than 70 ?Continue Lipitor. ? ? ? ? ?  ? ?Subjective: Denies any complaints.  No chest pain or shortness of breath. ? ?Physical Exam: ?Vitals:  ? 09/12/21 2015 09/13/21 0518 09/13/21 0941 09/13/21 1223   ?BP: 117/81 115/75 (!) 136/104 126/73  ?Pulse: 76 (!) 58 77 (!) 55  ?Resp:   16 16  ?Temp: 97.6 ?F (36.4 ?C) 97.7 ?F (36.5 ?C) 97.8 ?F (36.6 ?C) 97.6 ?F (36.4 ?C)  ?TempSrc: Oral Axillary Oral Axillary  ?SpO2: 96% 100% 96% 98%  ?Weight:      ?Height:      ? ?General exam: Alert, awake, oriented x 3 ?Respiratory system: Clear to auscultation. Respiratory effort normal. ?Cardiovascular system:RRR. No murmurs, rubs, gallops. ?Gastrointestinal system: Abdomen is nondistended, soft and nontender. No organomegaly or masses felt. Normal bowel sounds heard. ?Central nervous system: Alert and oriented. No focal neurological deficits. ?Extremities: No C/C/E, +pedal pulses ?Skin: No rashes, lesions or ulcers ?Psychiatry: Judgement and insight appear normal. Mood & affect appropriate.  ? ?Data Reviewed: ? ?There are no new results to review at this time. ? ?Family Communication: Updated patient's son over the phone ? ?Disposition: ?Status is: Inpatient ?Remains inpatient appropriate because: Awaiting skilled nursing facility placement ? Planned Discharge Destination: Skilled nursing facility ? ? ? ?Time spent: 35 minutes ? ?Author: ?Kathie Dike, MD ?09/13/2021 7:03 PM ? ?For on call review [email or web address].  ?

## 2021-09-14 DIAGNOSIS — I499 Cardiac arrhythmia, unspecified: Secondary | ICD-10-CM | POA: Diagnosis not present

## 2021-09-14 DIAGNOSIS — Z8616 Personal history of COVID-19: Secondary | ICD-10-CM | POA: Diagnosis not present

## 2021-09-14 DIAGNOSIS — I229 Subsequent ST elevation (STEMI) myocardial infarction of unspecified site: Secondary | ICD-10-CM | POA: Diagnosis not present

## 2021-09-14 DIAGNOSIS — M6281 Muscle weakness (generalized): Secondary | ICD-10-CM | POA: Diagnosis not present

## 2021-09-14 DIAGNOSIS — K219 Gastro-esophageal reflux disease without esophagitis: Secondary | ICD-10-CM | POA: Diagnosis not present

## 2021-09-14 DIAGNOSIS — E1151 Type 2 diabetes mellitus with diabetic peripheral angiopathy without gangrene: Secondary | ICD-10-CM | POA: Diagnosis not present

## 2021-09-14 DIAGNOSIS — G20 Parkinson's disease: Secondary | ICD-10-CM | POA: Diagnosis not present

## 2021-09-14 DIAGNOSIS — I213 ST elevation (STEMI) myocardial infarction of unspecified site: Secondary | ICD-10-CM | POA: Diagnosis not present

## 2021-09-14 DIAGNOSIS — W19XXXA Unspecified fall, initial encounter: Secondary | ICD-10-CM | POA: Diagnosis not present

## 2021-09-14 DIAGNOSIS — E876 Hypokalemia: Secondary | ICD-10-CM | POA: Diagnosis not present

## 2021-09-14 DIAGNOSIS — Z515 Encounter for palliative care: Secondary | ICD-10-CM

## 2021-09-14 DIAGNOSIS — Z7401 Bed confinement status: Secondary | ICD-10-CM | POA: Diagnosis not present

## 2021-09-14 DIAGNOSIS — R000 Tachycardia, unspecified: Secondary | ICD-10-CM | POA: Diagnosis not present

## 2021-09-14 DIAGNOSIS — R296 Repeated falls: Secondary | ICD-10-CM | POA: Diagnosis not present

## 2021-09-14 DIAGNOSIS — E038 Other specified hypothyroidism: Secondary | ICD-10-CM | POA: Diagnosis not present

## 2021-09-14 DIAGNOSIS — F0392 Unspecified dementia, unspecified severity, with psychotic disturbance: Secondary | ICD-10-CM | POA: Diagnosis not present

## 2021-09-14 DIAGNOSIS — I480 Paroxysmal atrial fibrillation: Secondary | ICD-10-CM | POA: Diagnosis not present

## 2021-09-14 DIAGNOSIS — E782 Mixed hyperlipidemia: Secondary | ICD-10-CM | POA: Diagnosis not present

## 2021-09-14 DIAGNOSIS — R278 Other lack of coordination: Secondary | ICD-10-CM | POA: Diagnosis not present

## 2021-09-14 DIAGNOSIS — E039 Hypothyroidism, unspecified: Secondary | ICD-10-CM | POA: Diagnosis not present

## 2021-09-14 DIAGNOSIS — I2109 ST elevation (STEMI) myocardial infarction involving other coronary artery of anterior wall: Secondary | ICD-10-CM | POA: Diagnosis not present

## 2021-09-14 DIAGNOSIS — I10 Essential (primary) hypertension: Secondary | ICD-10-CM | POA: Diagnosis not present

## 2021-09-14 DIAGNOSIS — E119 Type 2 diabetes mellitus without complications: Secondary | ICD-10-CM | POA: Diagnosis not present

## 2021-09-14 DIAGNOSIS — U071 COVID-19: Secondary | ICD-10-CM | POA: Diagnosis not present

## 2021-09-14 DIAGNOSIS — Z743 Need for continuous supervision: Secondary | ICD-10-CM | POA: Diagnosis not present

## 2021-09-14 DIAGNOSIS — R531 Weakness: Secondary | ICD-10-CM | POA: Diagnosis not present

## 2021-09-14 DIAGNOSIS — R2689 Other abnormalities of gait and mobility: Secondary | ICD-10-CM | POA: Diagnosis not present

## 2021-09-14 MED ORDER — ATORVASTATIN CALCIUM 40 MG PO TABS: 40.0000 mg | ORAL_TABLET | Freq: Every day | ORAL | Status: DC

## 2021-09-14 MED ORDER — ASPIRIN 81 MG PO TBEC: 81.0000 mg | DELAYED_RELEASE_TABLET | Freq: Every day | ORAL | 11 refills | Status: DC

## 2021-09-14 MED ORDER — CLOPIDOGREL BISULFATE 75 MG PO TABS: 75.0000 mg | ORAL_TABLET | Freq: Every day | ORAL | Status: DC

## 2021-09-14 MED ORDER — MORPHINE SULFATE (CONCENTRATE) 10 MG/0.5ML PO SOLN: 5.0000 mg | ORAL | 0 refills | Status: DC | PRN

## 2021-09-14 MED ORDER — LORAZEPAM 1 MG PO TABS: 1.0000 mg | ORAL_TABLET | Freq: Four times a day (QID) | ORAL | 0 refills | Status: DC | PRN

## 2021-09-14 MED ORDER — NITROGLYCERIN 0.4 MG SL SUBL: 0.4000 mg | SUBLINGUAL_TABLET | SUBLINGUAL | 12 refills | Status: DC | PRN

## 2021-09-14 MED ORDER — AMIODARONE HCL 200 MG PO TABS: 200.0000 mg | ORAL_TABLET | Freq: Every day | ORAL | Status: DC

## 2021-09-14 NOTE — TOC Progression Note (Addendum)
Transition of Care (TOC) - Progression Note  ? ? ?Patient Details  ?Name: Gerald Leach ?MRN: [id number] ?Date of Birth: Jan 01, 1934 ? ?Transition of Care (TOC) CM/SW Contact  ?Milas Gain, LCSWA ?Phone Number: ?09/14/2021, 11:46 AM ? ?Clinical Narrative:    ? ?CSW received insurance authorization approval for patient. [place] ID# [id number]. Insurance authorization start date is 3/30-4/3. Next review date is 4/3. CSW informed Sharyn Lull with [facility]. Sharyn Lull confirmed she can accept patient for dc today if medically ready.Sharyn Lull confirmed no covid needed for patient to dc over. CSW informed MD. CSW will continue to follow and assist with patients dc planning needs. ? ?Expected Discharge Plan: [place] ?Barriers to Discharge: Continued Medical Work up ? ?Expected Discharge Plan and Services ?Expected Discharge Plan: [place] ?In-house Referral: Clinical Social Work ?  ?Post Acute Care Choice: Hospice ?Living arrangements for the past 2 months: [place] ?                ?  ?  ?  ?  ?  ?  ?  ?  ?  ?  ? ? ?Social Determinants of Health (SDOH) Interventions ?  ? ?Readmission Risk Interventions ? ?  08/12/2020  ?  1:54 PM  ?Readmission Risk Prevention Plan  ?Post Dischage Appt Complete  ?Medication Screening Complete  ?Transportation Screening Complete  ? ? ?

## 2021-09-14 NOTE — Progress Notes (Signed)
MD notified about pt being without IV access at this time due to being uncooperative.  ?

## 2021-09-14 NOTE — TOC Transition Note (Addendum)
Transition of Care (TOC) - CM/SW Discharge Note ? ? ?Patient Details  ?Name: Gerald Leach ?MRN: [id number] ?Date of Birth: 02-16-1934 ? ?Transition of Care (TOC) CM/SW Contact:  ?Milas Gain, LCSWA ?Phone Number: ?09/14/2021, 1:35 PM ? ? ?Clinical Narrative:    ? ?Patient will DC to: [facility] SN[facility]  ? ?Anticipated DC date: 09/14/2021 ? ?Family notified: [name] ? ?Transport by: Corey Harold ? ?? ? ?Per MD patient ready for DC to [facility] with palliative services to follow . RN, patient,Melissa with [facility], patient's family, and facility notified of DC. Discharge Summary sent to facility. RN given number for report tele# [phone] RM# [phone]. DC packet on chart. DNR signed by MD attached to patients DC packet.Ambulance transport requested for patient. ? ?CSW signing off.  ? ?Final next level of care: [place] ([facility]) ?Barriers to Discharge: No Barriers Identified ? ? ?Patient Goals and CMS Choice ?Patient states their goals for this hospitalization and ongoing recovery are:: SNF ?CMS Medicare.gov Compare Post Acute Care list provided to:: Patient Represenative (must comment) (patients son [name]) ?Choice offered to / list presented to : Adult Children (patients son [name]) ? ?Discharge Placement ?  ?           ?Patient [facility] bed at: [facility] ?Patient to be transferred to facility by: [facility] ?Name of family member notified: [name] ?Patient and family notified of of transfer: 09/14/21 ? ?Discharge Plan and Services ?In-house Referral: Clinical Social Work ?  ?Post Acute Care Choice: Hospice          ?  ?  ?  ?  ?  ?  ?  ?  ?  ?  ? ?Social Determinants of Health (SDOH) Interventions ?  ? ? ?Readmission Risk Interventions ? ?  08/12/2020  ?  1:54 PM  ?Readmission Risk Prevention Plan  ?Post Dischage Appt Complete  ?Medication Screening Complete  ?Transportation Screening Complete  ? ? ? ? ? ?

## 2021-09-14 NOTE — Discharge Summary (Signed)
?Physician Discharge Summary ?  ?Patient: Gerald Leach MRN: [id number] DOB: [birth year]  ?Admit date:     09/03/2021  ?Discharge date: 09/14/21  ?Discharge Physician: Kathie Dike  ? ?PCP: [name], NP  ? ?Recommendations at discharge:  ? ?Follow up with cardiology Dr. Ellyn Hack in 2 weeks ?Please have hospice/palliative care follow patient at SNF ? ?Discharge Diagnoses: ?Active Problems: ?  Acute ST elevation myocardial infarction (STEMI) of anterior wall ([place]) ?  Ventricular tachycardia ?  Dementia due to Parkinson'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 behavioral disturbance ([place]) ?  COVID-19 virus infection ?  Seizure disorder ([place]) ?  Diabetes mellitus type 2, controlled ([place]) ?  Parkinson disease ([place]) ?  Hyperlipidemia with target LDL less than 70 ?  PAF (paroxysmal atrial fibrillation) ([place]) ?  Hypokalemia ?  Hypomagnesemia ?  ST elevation myocardial infarction (STEMI) ([place]) ?  Goals of care, counseling/discussion ?  Palliative care patient ? ?Resolved Problems: ?  * No resolved hospital problems. * ? ?Hospital Course: ?TAHJAI Leach is a 88 y.o. male with medical history significant of Parkinson's disease with dementia, seizure disorder on antiepileptics, hypertension presented to hospital with worsening mental status for a week or two and was minimally verbal for 1 week.  Patient had sudden onset of severe altered mental status at home and EMS was called in.  EMS noted that the patient was in ventricular tachycardia and cardioverted and had the EKG pattern of ST elevation MI.  Patient was then brought into the hospital as a code STEMI.  In the ED, cardiology was consulted and due to his frail status with advanced dementia conservative treatment was pursued.  Patient was a started on amiodarone and heparin drip and was admitted to the hospital for further evaluation and treatment.  During hospitalization, palliative care was consulted and after having goals of care discussion, family initially elected comfort measures.   Since that time, his overall condition/mental status appears to have improved.  Oral intake also improving.  He does not appear to be candidate for residential hospice.  Plans are to discharge to skilled nursing facility for rehab with hospice/palliative care to follow.  His cardiac meds will be restarted for now. ? ?Assessment and Plan: ?Ventricular tachycardia ?Likely secondary to ST elevation MI.  Cardioverted x1.  Patient was initiated on amiodarone drip which was changed to oral amiodarone ? ?Acute ST elevation myocardial infarction (STEMI) of anterior wall ([facility]) ? STEMI with monomorphic VT in field.  Patient was cardioverted x1 at the field site.  Seen by cardiology here and due to frailty and dementia not a candidate for left heart catheterization.  Patient is DNR.  Patient is on aspirin, Plavix and high intensity Lipitor, low-dose beta-blocker as tolerated,  nitroglycerin and amiodarone . Was on a heparin drip which was changed to Lovenox for total of 72hr. 2D echocardiogram with decreased LV function at 30 to 35% with septal apical and anterior wall hypokinesis.   ? ?COVID-19 virus infection ?Patient was having diarrhea and poor functioning at home likely secondary to COVID-19 infection.  Mild infection without any hypoxia.  No longer on isolation ? ?Dementia due to Parkinson'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 behavioral disturbance ([place]) ?Worsening mental status at home.  Patient was on carbidopa Vimpat Keppra and amantadine at home.  Family reports that overall mental status has improved and is actually better than baseline ? ?Parkinson disease ([place]) ?Patient is on Sinemet and amantadine from home.  ? ?Diabetes mellitus type 2, controlled ([place]) ?Metformin discontinued on discharge due to  risk of lactic acidosis ? ?Seizure disorder ([place]) ?Continue keppra and vimpat. ? ?Goals of care, counseling/discussion ?Palliative care on board.   ?Goals of care discussion had with patient's family when his overall condition appears to  be more critical.  He was less responsive at that time. ?Since that time, he is mentation has improved, he is interacting and oral intake improving.  Does not appear to be a candidate for residential hospice at this time ?Plans are for patient to go to skilled nursing facility for rehab with palliative/hospice to follow. ? ?Hypomagnesemia ?Was replenished through IV and orally.   ? ?Hypokalemia ?Was replenished. ? ?PAF (paroxysmal atrial fibrillation) ([place]) ?Patient has history of atrial fibrillation but not on anticoagulation as outpatient.  Currently normal sinus rhythm.  Unable to use beta-blocker due to bradycardia. ? ?Hyperlipidemia with target LDL less than 70 ?Continue Lipitor. ? ? ? ? ?  ? ?Pain control - [employer] Controlled Substance Reporting System database was reviewed. and patient was instructed, not to drive, operate heavy machinery, perform activities at heights, swimming or participation in water activities or provide baby-sitting services while on Pain, Sleep and Anxiety Medications; until their outpatient Physician has advised to do so again. Also recommended to not to take more than prescribed Pain, Sleep and Anxiety Medications.  ?Consultants: cardiology, palliative care ?Procedures performed:   ?Disposition: Skilled nursing facility ?Diet recommendation:  ?Discharge Diet Orders (From admission, onward)  ? ?  Start     Ordered  ? 09/14/21 0000  Diet - low sodium heart healthy       ? 09/14/21 1307  ? ?  ?  ? ?  ? ?Cardiac and Carb modified diet ?DISCHARGE MEDICATION: ?Allergies as of 09/14/2021   ?No Known Allergies ?  ? ?  ?Medication List  ?  ? ?STOP taking these medications   ? ?metFORMIN 500 MG tablet ?Commonly known as: GLUCOPHAGE ?  ? ?  ? ?TAKE these medications   ? ?acetaminophen 325 MG tablet ?Commonly known as: TYLENOL ?Take 2 tablets (650 mg total) by mouth every 6 (six) hours as needed for mild pain (or Fever >/= 101). ?  ?amantadine 100 MG capsule ?Commonly known as:  SYMMETREL ?Take 1 capsule (100 mg total) by mouth daily. ?What changed: when to take this ?  ?amiodarone 200 MG tablet ?Commonly known as: PACERONE ?Take 1 tablet (200 mg total) by mouth daily. ?Start taking on: September 15, 2021 ?  ?aspirin 81 MG EC tablet ?Take 1 tablet (81 mg total) by mouth daily. Swallow whole. ?Start taking on: September 15, 2021 ?  ?atorvastatin 40 MG tablet ?Commonly known as: LIPITOR ?Take 1 tablet (40 mg total) by mouth daily. ?Start taking on: September 15, 2021 ?  ?carbidopa-levodopa 25-100 MG tablet ?Commonly known as: SINEMET IR ?TAKE 1 TABLET BY MOUTH THREE TIMES A DAY ?  ?clopidogrel 75 MG tablet ?Commonly known as: PLAVIX ?Take 1 tablet (75 mg total) by mouth daily. ?Start taking on: September 15, 2021 ?  ?ferrous sulfate 325 (65 FE) MG tablet ?Take 325 mg by mouth daily with breakfast. ?  ?Lacosamide 150 MG Tabs ?Take 1 tablet (150 mg total) by mouth 2 (two) times daily. ?  ?levETIRAcetam 750 MG tablet ?Commonly known as: KEPPRA ?TAKE 2 TABLETS (1,500 MG TOTAL) BY MOUTH 2 (TWO) TIMES DAILY. ?  ?levothyroxine 75 MCG tablet ?Commonly known as: SYNTHROID ?TAKE 1 TABLET BY MOUTH EVERY DAY ?What changed: when to take this ?  ?LORazepam 1 MG tablet ?Commonly  known as: ATIVAN ?Take 1 tablet (1 mg total) by mouth every 6 (six) hours as needed for anxiety (dyspnea). ?  ?morphine CONCENTRATE 10 MG/0.5ML Soln concentrated solution ?Take 0.25 mLs (5 mg total) by mouth every 4 (four) hours as needed for moderate pain (or dyspnea). ?  ?nitroGLYCERIN 0.4 MG SL tablet ?Commonly known as: NITROSTAT ?Place 1 tablet (0.4 mg total) under the tongue every 5 (five) minutes x 3 doses as needed for chest pain. ?  ?omeprazole 20 MG capsule ?Commonly known as: PRILOSEC ?TAKE 1 CAPSULE BY MOUTH EVERY DAY ?What changed: how much to take ?  ?ondansetron 4 MG tablet ?Commonly known as: ZOFRAN ?Take 1 tablet (4 mg total) by mouth every 6 (six) hours as needed for nausea. ?  ?vitamin B-12 1000 MCG tablet ?Commonly known as:  CYANOCOBALAMIN ?Take 1,000 mcg by mouth daily. ?  ? ?  ? ? ?Discharge Exam: ?Filed Weights  ? 09/04/21 0037 09/04/21 0612  ?Weight: 64.6 kg 67.4 kg  ? ?General exam: Alert, awake, no distress ?Respiratory system: Cle

## 2021-09-14 NOTE — Plan of Care (Signed)
?  Problem: Education: ?Goal: Knowledge of General Education information will improve ?Description: Including pain rating scale, medication(s)/side effects and non-pharmacologic comfort measures ?Outcome: Not Progressing ?  ?Problem: Coping: ?Goal: Level of anxiety will decrease ?Outcome: Not Progressing ?  ?Problem: Elimination: ?Goal: Will not experience complications related to urinary retention ?Outcome: Progressing ?  ?Problem: Pain Managment: ?Goal: General experience of comfort will improve ?Outcome: Progressing ?  ?Problem: Safety: ?Goal: Ability to remain free from injury will improve ?Outcome: Progressing ?  ?

## 2021-09-14 NOTE — Care Management Important Message (Signed)
Important Message ? ?Patient Details  ?Name: Gerald Leach ?MRN: [id number] ?Date of Birth: 1934/03/09 ? ? ?Medicare Important Message Given:  Yes ? ? ? ? ?Shelda Altes ?09/14/2021, 11:39 AM ?

## 2021-09-14 NOTE — Progress Notes (Addendum)
Patient JH:ERDEYC C Velazquez      DOB: 1934-05-24      XKG:[id number] ? ? ? ?  ?Palliative Medicine Team ? ? ? ?Subjective: Bedside symptom check. No family or visitors at time of visit. Collaborated with IV team RN outside of room prior to visit.  ? ? ?Physical exam: Patient resting comfortably with eyes closed in bed at time of visit. No physical or non-verbal signs of pain or discomfort noted. Patient easily awakens to vocal cue and mostly holds eye contact throughout conversation, participating with one word answers. Patient quickly drifts back to sleep after answering. Breathing even and non-labored, no excessive secretions noted.  ? ? ?Assessment and plan: Plan to continue with current therapies today and all medications PO as IV access was lost last night in patient's agitation. Patient did receive doses of ativan overnight to assist with these behaviors and he is very lethargic this morning in comparison. This RN discussed with bedside RN the use of PRN pain medications before benzos to keep patient comfortable and less sedated. Discussed these non-verbal signs of pain in patients with dementia. PMT NP [place] to assist with oral conversion of needed medications. Attending MD, Dr. Roderic Palau in agreement to move forward with PO options, no IV access required, with goal to optimize for SNF rehab once insurance authorization is complete. Will continue to follow for needs or advances.  ? ? ?Addendum: This RN confirmed with TOC that outpatient palliative services referral made prior to discharge planning. ? ? ?Thank you for allowing the Palliative Medicine Team to assist in the care of this patient. ?  ?  ?Damian Leavell, MSN, RN ?Palliative Medicine Team ?Team Phone: [phone]  ?This phone is monitored 7a-7p, please reach out to attending physician outside of these hours for urgent needs.   ?

## 2021-09-14 NOTE — Progress Notes (Addendum)
Pt report called to [facility] unsuccessfully. VM full. ? ?Called again. Call transferred to receiving RN but no one picked up the phone and call dropped ?

## 2021-09-14 NOTE — Consult Note (Signed)
? ?  [facility] CM Inpatient Consult ? ? ?09/14/2021 ? ?Justin Mend Hoeppner ?10/31/1933 ?[id number] ? ?Follow up:  [facility] ? ?[place] [facility] [AC[facility]] Patient: [employer] ? ?Primary Care Provider:  [name], NP, Embedded provider, Review reveals patient is for a skilled nursing facility level of care ? ?If the patient goes to a [facility] affiliated facility then, patient can be followed by [place] [facility] PAC RN with traditional Medicare.  ? ? ?Plan:   [facility] PAC RN can follow for any known or needs for transitional care needs for returning to post facility care or complex disease management. ? ?For questions or referrals, please contact: ? ? ?Natividad Brood, RN BSN CCM ?[facility] Liaison ? [phone] business mobile phone ?Toll free office [phone]  ?Fax number: [phone] ?[email or web address] ? ?  ? ?

## 2021-09-15 DIAGNOSIS — I480 Paroxysmal atrial fibrillation: Secondary | ICD-10-CM | POA: Diagnosis not present

## 2021-09-15 DIAGNOSIS — E119 Type 2 diabetes mellitus without complications: Secondary | ICD-10-CM | POA: Diagnosis not present

## 2021-09-15 DIAGNOSIS — E876 Hypokalemia: Secondary | ICD-10-CM | POA: Diagnosis not present

## 2021-09-15 DIAGNOSIS — I10 Essential (primary) hypertension: Secondary | ICD-10-CM | POA: Diagnosis not present

## 2021-09-15 DIAGNOSIS — I229 Subsequent ST elevation (STEMI) myocardial infarction of unspecified site: Secondary | ICD-10-CM | POA: Diagnosis not present

## 2021-09-15 DIAGNOSIS — Z8616 Personal history of COVID-19: Secondary | ICD-10-CM | POA: Diagnosis not present

## 2021-09-15 DIAGNOSIS — F0392 Unspecified dementia, unspecified severity, with psychotic disturbance: Secondary | ICD-10-CM | POA: Diagnosis not present

## 2021-09-18 ENCOUNTER — Other Ambulatory Visit: Payer: Self-pay | Admitting: *Deleted

## 2021-09-18 DIAGNOSIS — I229 Subsequent ST elevation (STEMI) myocardial infarction of unspecified site: Secondary | ICD-10-CM | POA: Diagnosis not present

## 2021-09-18 DIAGNOSIS — E119 Type 2 diabetes mellitus without complications: Secondary | ICD-10-CM | POA: Diagnosis not present

## 2021-09-18 DIAGNOSIS — E876 Hypokalemia: Secondary | ICD-10-CM | POA: Diagnosis not present

## 2021-09-18 DIAGNOSIS — F0392 Unspecified dementia, unspecified severity, with psychotic disturbance: Secondary | ICD-10-CM | POA: Diagnosis not present

## 2021-09-18 DIAGNOSIS — I480 Paroxysmal atrial fibrillation: Secondary | ICD-10-CM | POA: Diagnosis not present

## 2021-09-18 DIAGNOSIS — I10 Essential (primary) hypertension: Secondary | ICD-10-CM | POA: Diagnosis not present

## 2021-09-18 NOTE — Patient Outreach (Signed)
Per [place] eligible member currently resides in [facility].  Screening for [facility] care coordination services as a benefit of [employer] plan. ? ?Mr. Dafoe admitted to SNF on 09/14/21 after hospitalization. ? ?Facility site visit to [facility] skilled nursing facility. Met with [place], SNF SW to make aware writer is following transition plans/needs. SNF SW reports Mr. Bartoli lived alone witih caregiver assistance during the day. Anticipated transition plan is to return home with 24/7 caregivers vs LTC. Palliative to follow while in SNF.  ? ?Member's PCP at [facility] has [place] care coordination team available. Mr. Lefferts is active with CCM services at PCP office. He is followed by [place] care coordinator at [facility]. ? ?Spoke with Mr. Sagraves in room at [facility]. ? ?Will send notification to [place] RN care coordinator to make aware writer is following while member resides in SNF. ? ? ?Marthenia Rolling, MSN, RN,BSN ?[place] Coordinator ?[phone] [facility]) ?[phone]  (Toll free office)  ? ? ? ? ?  ?

## 2021-09-19 DIAGNOSIS — I10 Essential (primary) hypertension: Secondary | ICD-10-CM | POA: Diagnosis not present

## 2021-09-19 DIAGNOSIS — W19XXXA Unspecified fall, initial encounter: Secondary | ICD-10-CM | POA: Diagnosis not present

## 2021-09-20 DIAGNOSIS — F0392 Unspecified dementia, unspecified severity, with psychotic disturbance: Secondary | ICD-10-CM | POA: Diagnosis not present

## 2021-09-20 DIAGNOSIS — K219 Gastro-esophageal reflux disease without esophagitis: Secondary | ICD-10-CM | POA: Diagnosis not present

## 2021-09-20 DIAGNOSIS — E876 Hypokalemia: Secondary | ICD-10-CM | POA: Diagnosis not present

## 2021-09-20 DIAGNOSIS — I480 Paroxysmal atrial fibrillation: Secondary | ICD-10-CM | POA: Diagnosis not present

## 2021-09-20 DIAGNOSIS — I229 Subsequent ST elevation (STEMI) myocardial infarction of unspecified site: Secondary | ICD-10-CM | POA: Diagnosis not present

## 2021-09-20 DIAGNOSIS — I10 Essential (primary) hypertension: Secondary | ICD-10-CM | POA: Diagnosis not present

## 2021-09-20 DIAGNOSIS — E119 Type 2 diabetes mellitus without complications: Secondary | ICD-10-CM | POA: Diagnosis not present

## 2021-09-21 ENCOUNTER — Ambulatory Visit (INDEPENDENT_AMBULATORY_CARE_PROVIDER_SITE_OTHER): Payer: Medicare Other

## 2021-09-21 DIAGNOSIS — G40909 Epilepsy, unspecified, not intractable, without status epilepticus: Secondary | ICD-10-CM

## 2021-09-21 DIAGNOSIS — F0280 Dementia in other diseases classified elsewhere without behavioral disturbance: Secondary | ICD-10-CM

## 2021-09-21 DIAGNOSIS — I2109 ST elevation (STEMI) myocardial infarction involving other coronary artery of anterior wall: Secondary | ICD-10-CM

## 2021-09-21 DIAGNOSIS — G20 Parkinson's disease: Secondary | ICD-10-CM

## 2021-09-21 NOTE — Chronic Care Management (AMB) (Signed)
?Chronic Care Management  ? ?CCM RN Visit Note ? ?09/21/2021 ?Name: Gerald Leach MRN: [id number] DOB: [birth year] ? ?Subjective: ?Gerald Leach is a 88 y.o. year old male who is a primary care patient of [name], NP. The care management team was consulted for assistance with disease management and care coordination needs.   ? ?Engaged with patient by telephone for follow up visit in response to provider referral for case management and/or care coordination services.  ? ?Consent to Services:  ?The patient was given information about Chronic Care Management services, agreed to services, and gave verbal consent prior to initiation of services.  Please see initial visit note for detailed documentation.  ? ?Patient agreed to services and verbal consent obtained.  ? ?Assessment: Review of patient past medical history, allergies, medications, health status, including review of consultants reports, laboratory and other test data, was performed as part of comprehensive evaluation and provision of chronic care management services.  ? ?SDOH (Social Determinants of Health) assessments and interventions performed:   ? ?CCM Care Plan ? ?No Known Allergies ? ?Outpatient Encounter Medications as of 09/21/2021  ?Medication Sig Note  ? acetaminophen (TYLENOL) 325 MG tablet Take 2 tablets (650 mg total) by mouth every 6 (six) hours as needed for mild pain (or Fever >/= 101).   ? amantadine (SYMMETREL) 100 MG capsule Take 1 capsule (100 mg total) by mouth daily. (Patient taking differently: Take 100 mg by mouth at bedtime.)   ? amiodarone (PACERONE) 200 MG tablet Take 1 tablet (200 mg total) by mouth daily.   ? aspirin EC 81 MG EC tablet Take 1 tablet (81 mg total) by mouth daily. Swallow whole.   ? atorvastatin (LIPITOR) 40 MG tablet Take 1 tablet (40 mg total) by mouth daily.   ? carbidopa-levodopa (SINEMET IR) 25-100 MG tablet TAKE 1 TABLET BY MOUTH THREE TIMES A DAY (Patient taking differently: Take 1 tablet by mouth 3 (three)  times daily.)   ? clopidogrel (PLAVIX) 75 MG tablet Take 1 tablet (75 mg total) by mouth daily.   ? ferrous sulfate 325 (65 FE) MG tablet Take 325 mg by mouth daily with breakfast.   ? Lacosamide 150 MG TABS Take 1 tablet (150 mg total) by mouth 2 (two) times daily.   ? levETIRAcetam (KEPPRA) 750 MG tablet TAKE 2 TABLETS (1,500 MG TOTAL) BY MOUTH 2 (TWO) TIMES DAILY. 09/03/2021: LF 09/02/21 #180  ? levothyroxine (SYNTHROID) 75 MCG tablet TAKE 1 TABLET BY MOUTH EVERY DAY (Patient taking differently: Take 75 mcg by mouth daily before breakfast.)   ? LORazepam (ATIVAN) 1 MG tablet Take 1 tablet (1 mg total) by mouth every 6 (six) hours as needed for anxiety (dyspnea).   ? Morphine Sulfate (MORPHINE CONCENTRATE) 10 MG/0.5ML SOLN concentrated solution Take 0.25 mLs (5 mg total) by mouth every 4 (four) hours as needed for moderate pain (or dyspnea).   ? nitroGLYCERIN (NITROSTAT) 0.4 MG SL tablet Place 1 tablet (0.4 mg total) under the tongue every 5 (five) minutes x 3 doses as needed for chest pain.   ? omeprazole (PRILOSEC) 20 MG capsule TAKE 1 CAPSULE BY MOUTH EVERY DAY (Patient taking differently: Take 20 mg by mouth daily.)   ? ondansetron (ZOFRAN) 4 MG tablet Take 1 tablet (4 mg total) by mouth every 6 (six) hours as needed for nausea.   ? vitamin B-12 (CYANOCOBALAMIN) 1000 MCG tablet Take 1,000 mcg by mouth daily.   ? ?No facility-administered encounter medications on file as of 09/21/2021.  ? ? ?  Patient Active Problem List  ? Diagnosis Date Noted  ? Palliative care patient 09/14/2021  ? Hypokalemia 09/05/2021  ? Hypomagnesemia 09/05/2021  ? ST elevation myocardial infarction (STEMI) ([place])   ? Goals of care, counseling/discussion   ? PAF (paroxysmal atrial fibrillation) ([place]) 09/04/2021  ? Acute ST elevation myocardial infarction (STEMI) of anterior wall ([place]) 09/03/2021  ? Ventricular tachycardia ([place]) 09/03/2021  ? COVID-19 virus infection 09/03/2021  ? Breakthrough seizure ([place]) 03/19/2021  ? Seizure ([place]) 03/18/2021   ? Dementia due to Parkinson'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 behavioral disturbance ([place]) 08/16/2020  ? Fall 08/11/2020  ? Closed compression fracture of L5 lumbar vertebra, initial encounter ([place]) 08/11/2020  ? Seizures ([place]) 03/05/2018  ? Leukocytosis 03/05/2018  ? Diabetic foot ulcers ([place]) 04/23/2017  ? Parkinson disease ([place])   ? Gait instability   ? Diabetes mellitus type 2, controlled ([place]) 08/05/2015  ? Hypothyroidism 08/05/2015  ? Fall at home 10/02/2014  ? CNS aneurysm s/p coiling Rob Hickman, 2002) 10/02/2014  ? High grade T7 central canal stenosis with T7 vertebral fracture s/p fall (02/2012) 10/02/2014  ? 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 10/02/2014  ? Syncope 02/05/2014  ? Drug-induced delirium(292.81) 05/16/2013  ? Seizure disorder ([place]) 04/21/2013  ? Seizure disorder, grand mal ([place]) 03/24/2013  ? Cervical compression fracture---C7 10/29/2012  ? Dysphagia 09/21/2012  ? Subdural hematoma ([place]) 09/20/2012  ? SOLAR KERATOSIS 02/17/2009  ? CARCINOMA, SKIN, SQUAMOUS CELL, FACE 04/19/2008  ? Hyperlipidemia with target LDL less than 70 01/27/2008  ? COLONIC POLYPS 11/15/2006  ? GERD 11/15/2006  ? ? ?Conditions to be addressed/monitored:CAD, HLD, and seizures, Parkinson's disease, dememtia ? ?Care Plan : RN Care Manager Plan of Care  ?Updates made by Dimitri Ped, RN since 09/21/2021 12:00 AM  ?  ? ?Problem: Chronic Disease Management and Care Coordination Needs (Seizures, Parkinson disease, DM. HLD)   ?Priority: High  ?  ? ?Long-Range Goal: Establish Plan of Care for Chronic Disease Management Needs (Seizures, Parkinson disease, DM. HLD)   ?Start Date: 05/18/2021  ?Expected End Date: 11/14/2021  ?Priority: High  ?Note:   ?Current Barriers:  ?Care Coordination needs related to ADL IADL limitations, Memory Deficits, Inability to perform ADL's independently, and Inability to perform IADL's independently ?Chronic Disease Management support and education needs related to  HLD, DMII, and Parkinson disease, seizures,dementia  ?Cognitive Deficits ?Spoke with son Anguel Delapena.     States that pt has been progressing and getting stronger since he went to [facility].  States he has been confused at times and other times he is clear.  States he started therapy on Monday and cooperates sometimes with the therapy.  States he thinks pt will not be able to go back home.  States will be looking into long term care or Assisted Living. States they will be working with the SW at the facility on his options.  States they do not want to be aggressive with is care and Palliative care is to follow him.  ?RNCM Clinical Goal(s):  ?Patient will verbalize basic understanding of  HLD, DMII, and Parkinson disease, seizures  disease process and self health management plan as evidenced by voiced adherence to plan of care ?take all medications exactly as prescribed and will call provider for medication related questions as evidenced by dispense report and pt verbalization ?attend all scheduled medical appointments: none scheduled as evidenced by review of medical records ?demonstrate Improved adherence to prescribed treatment plan for HLD, DMII, and Parkinson disease, seizures,  dementia  as evidenced by readings within limits, voiced adherence to plan of care ?continue to work with RN Care Manager to address care management and care coordination needs related to  HLD, DMII, and Parkinson disease, seizures, dementia  as evidenced by adherence to CM Team Scheduled appointments through collaboration with RN Care manager, provider, and care team.  ? ?Interventions: ?1:1 collaboration with primary care provider regarding development and update of comprehensive plan of care as evidenced by provider attestation and co-signature ?Inter-disciplinary care team collaboration (see longitudinal plan of care) ?Evaluation of current treatment plan related to  self management and patient's adherence to plan as established  by provider ?Reviewed with son his options and the differences in long term care and assisted living. Encouraged to work with facility SW and Palliative care on his next options ? ? ?Dementia:  ([place]

## 2021-09-21 NOTE — Patient Instructions (Signed)
Visit Information ? ?Thank you for taking time to visit with me today. Please don't hesitate to contact me if I can be of assistance to you before our next scheduled telephone appointment. ? ?Following are the goals we discussed today:  ?Take all medications as prescribed ?Attend all scheduled provider appointments ?Call provider office for new concerns or questions  ?check blood sugar at prescribed times: when you have symptoms of low or high blood sugar and 1-2 times a week ?check feet daily for cuts, sores or redness ?take the blood sugar log to all doctor visits ?drink 6 to 8 glasses of water each day ?wash and dry feet carefully every day ?call for medicine refill 2 or 3 days before it runs out ?take all medications exactly as prescribed ?call doctor with any symptoms you believe are related to your medicine ? ?Our next appointment is by telephone on 11/02/21 at 9 AM ? ?Please call the care guide team at [phone] if you need to cancel or reschedule your appointment.  ? ?If you are experiencing a Mental Health or [place] or need someone to talk to, please call the Suicide and Crisis Lifeline: 988 ?call the [place] National Suicide Prevention Lifeline: 267-790-6123 or TTY: (360)593-8293 TTY (360) 833-2146) to talk to a trained counselor ?call 1-800-273-TALK (toll free, 24 hour hotline) ?go to [facility] [address] [place] [phone]) ?call 911  ? ?The patient verbalized understanding of instructions, educational materials, and care plan provided today and agreed to receive a mailed copy of patient instructions, educational materials, and care plan.  ? ?Peter Garter RN, BSN,CCM, CDE ?Care Management Coordinator ?[facility] ?[phone]   ?

## 2021-09-22 ENCOUNTER — Other Ambulatory Visit: Payer: Self-pay | Admitting: Adult Health

## 2021-09-22 DIAGNOSIS — R296 Repeated falls: Secondary | ICD-10-CM | POA: Diagnosis not present

## 2021-09-22 DIAGNOSIS — E119 Type 2 diabetes mellitus without complications: Secondary | ICD-10-CM | POA: Diagnosis not present

## 2021-09-22 DIAGNOSIS — I10 Essential (primary) hypertension: Secondary | ICD-10-CM | POA: Diagnosis not present

## 2021-09-22 DIAGNOSIS — I229 Subsequent ST elevation (STEMI) myocardial infarction of unspecified site: Secondary | ICD-10-CM | POA: Diagnosis not present

## 2021-09-22 DIAGNOSIS — F0392 Unspecified dementia, unspecified severity, with psychotic disturbance: Secondary | ICD-10-CM | POA: Diagnosis not present

## 2021-09-22 DIAGNOSIS — I480 Paroxysmal atrial fibrillation: Secondary | ICD-10-CM | POA: Diagnosis not present

## 2021-09-22 DIAGNOSIS — E876 Hypokalemia: Secondary | ICD-10-CM | POA: Diagnosis not present

## 2021-09-25 ENCOUNTER — Telehealth: Payer: Self-pay | Admitting: Pharmacist

## 2021-09-25 DIAGNOSIS — I480 Paroxysmal atrial fibrillation: Secondary | ICD-10-CM | POA: Diagnosis not present

## 2021-09-25 DIAGNOSIS — R296 Repeated falls: Secondary | ICD-10-CM | POA: Diagnosis not present

## 2021-09-25 DIAGNOSIS — E876 Hypokalemia: Secondary | ICD-10-CM | POA: Diagnosis not present

## 2021-09-25 DIAGNOSIS — I10 Essential (primary) hypertension: Secondary | ICD-10-CM | POA: Diagnosis not present

## 2021-09-25 DIAGNOSIS — E119 Type 2 diabetes mellitus without complications: Secondary | ICD-10-CM | POA: Diagnosis not present

## 2021-09-25 DIAGNOSIS — F0392 Unspecified dementia, unspecified severity, with psychotic disturbance: Secondary | ICD-10-CM | POA: Diagnosis not present

## 2021-09-25 DIAGNOSIS — I229 Subsequent ST elevation (STEMI) myocardial infarction of unspecified site: Secondary | ICD-10-CM | POA: Diagnosis not present

## 2021-09-25 DIAGNOSIS — W19XXXA Unspecified fall, initial encounter: Secondary | ICD-10-CM | POA: Diagnosis not present

## 2021-09-25 NOTE — Chronic Care Management (AMB) (Signed)
A user error has taken place: encounter opened in error, closed for administrative reasons.

## 2021-09-29 ENCOUNTER — Other Ambulatory Visit: Payer: Self-pay | Admitting: *Deleted

## 2021-09-29 ENCOUNTER — Non-Acute Institutional Stay: Payer: Medicare Other | Admitting: Student

## 2021-09-29 DIAGNOSIS — R531 Weakness: Secondary | ICD-10-CM

## 2021-09-29 DIAGNOSIS — W19XXXA Unspecified fall, initial encounter: Secondary | ICD-10-CM | POA: Diagnosis not present

## 2021-09-29 DIAGNOSIS — I10 Essential (primary) hypertension: Secondary | ICD-10-CM | POA: Diagnosis not present

## 2021-09-29 DIAGNOSIS — R296 Repeated falls: Secondary | ICD-10-CM | POA: Diagnosis not present

## 2021-09-29 DIAGNOSIS — F0280 Dementia in other diseases classified elsewhere without behavioral disturbance: Secondary | ICD-10-CM

## 2021-09-29 DIAGNOSIS — F0392 Unspecified dementia, unspecified severity, with psychotic disturbance: Secondary | ICD-10-CM | POA: Diagnosis not present

## 2021-09-29 DIAGNOSIS — I480 Paroxysmal atrial fibrillation: Secondary | ICD-10-CM | POA: Diagnosis not present

## 2021-09-29 DIAGNOSIS — G20 Parkinson's disease: Secondary | ICD-10-CM | POA: Diagnosis not present

## 2021-09-29 DIAGNOSIS — E119 Type 2 diabetes mellitus without complications: Secondary | ICD-10-CM | POA: Diagnosis not present

## 2021-09-29 DIAGNOSIS — E876 Hypokalemia: Secondary | ICD-10-CM | POA: Diagnosis not present

## 2021-09-29 DIAGNOSIS — I229 Subsequent ST elevation (STEMI) myocardial infarction of unspecified site: Secondary | ICD-10-CM | POA: Diagnosis not present

## 2021-09-29 DIAGNOSIS — Z515 Encounter for palliative care: Secondary | ICD-10-CM

## 2021-09-29 NOTE — Progress Notes (Signed)
? ? ?Manufacturing engineer ?Community Palliative Care Consult Note ?Telephone: [phone]  ?Fax: [phone]  ? ?Date of encounter: 09/29/21 ? ?PATIENT NAME: Gerald Leach ?[address] [place] ?[place] [address]   ?[phone] (home) [phone] (work) ?DOB: 1934-06-03 ?MRN: [id number] ?PRIMARY CARE PROVIDER:    ?[name], NP,  ?[facility] ?[place] [address] ?[phone] ? ?REFERRING PROVIDER:   ?Vilinda Boehringer, NP ? ?RESPONSIBLE PARTY:    ?Contact Information   ? ? Name Relation Home Work Mobile  ? Lum, Stillinger Son [phone]  [phone]  ? Hattery,Leighanne Daughter   [phone]  ? Derose,Robin Daughter   [phone]  ? ?  ? ? ? ?I met face to face with patient and family in the facility. Palliative Care was asked to follow this patient by consultation request of  Vilinda Boehringer, NP to address advance care planning and complex medical decision making. This is the initial visit.  ? ? ?                                 ASSESSMENT AND PLAN / RECOMMENDATIONS:  ? ?Advance Care Planning/Goals of Care: Goals include to maximize quality of life and symptom management. Patient/health care surrogate gave his/her permission to discuss.Our advance care planning conversation included a discussion about:    ?The value and importance of advance care planning  ?Experiences with loved ones who have been seriously ill or have died  ?Exploration of personal, cultural or spiritual beliefs that might influence medical decisions  ?Exploration of goals of care in the event of a sudden injury or illness  ?CODE STATUS: DNR ? ?Education provided on Palliative Medicine vs. Hospice services. Family would like for patient to continue therapy, currently in appeal process. Plan is to transition to [facility] in [place] once therapy is completed.  ? ?Symptom Management/Plan: ? ?Generalized weakness-patient to continue therapy as directed.  ? ?Parkinson's-continue sinemet and amantadine as directed.  Staff to assist with adl's. Dementia-reorient and redirect as needed, monitor for cognitive and functional declines. Monitor for decline in appetite.  ? ?Follow up Palliative Care Visit: Palliative care will continue to follow for complex medical decision making, advance care planning, and clarification of goals. Return in 4 weeks or prn. ? ? ?This visit was coded based on medical decision making (MDM). ? ?PPS: 40% ? ?HOSPICE ELIGIBILITY/DIAGNOSIS: TBD ? ?Chief Complaint: Palliative Medicine initial consult.  ? ?HISTORY OF PRESENT ILLNESS:  Gerald Leach is a 88 y.o. year old male  with Parkinson disease, dementia, STEMI, type 2  diabetes, paroxysmal atrial fibrillation, gerd, generalized muscle weakness, hyperlipidemia, hypothyroidism. Patient recently hospitalized 3/19 through 09/14/21 due to acute STEMI, ventricular tachycardia, COVID-19 virus infection, seizure disorder. During hospitalization hospice had been discussed and patient began to show signs of improvement in overall condition and it was decided to pursue rehab. ? ?Patient currently at [facility]. He is receiving therapy; son states they are in process of appealing. Patient with generalized weakness, states his "legs get tired." Endorses improvement to appetite. Denies pain, shortness of breath, nausea, constipation. He is sleeping well. A 10-Point ROS is negative, except for the pertinent positives and negatives detailed per the HPI. ? ?History obtained from review of EMR, discussion with primary team, and interview with family, facility staff/caregiver and/or Gerald Leach.  ?I reviewed available labs, medications, imaging, studies and related documents from the EMR.  Records reviewed and summarized above.  ? ? ?  Physical Exam: ?Pulse 80, resp 20, sats 95% on room air.  ?Constitutional: NAD ?General: frail appearing, thin ?EYES: anicteric sclera, lids intact, no discharge  ?ENMT: hard of hearing, oral mucous membranes moist, dentition  intact ?CV: S1S2, RRR, no LE edema ?Pulmonary: LCTA, no increased work of breathing, no cough, room air ?Abdomen:  normo-active BS + 4 quadrants, soft and non tender, no ascites ?GU: deferred ?MSK: moves all extremities, w/c ?Skin: warm and dry, no rashes or wounds on visible skin ?Neuro: + generalized weakness, A & O x 2 ?Psych: non-anxious affect, pleasant ?Hem/lymph/immuno: no widespread bruising ?CURRENT PROBLEM LIST:  ?Patient Active Problem List  ? Diagnosis Date Noted  ? Palliative care patient 09/14/2021  ? Hypokalemia 09/05/2021  ? Hypomagnesemia 09/05/2021  ? ST elevation myocardial infarction (STEMI) ([place])   ? Goals of care, counseling/discussion   ? PAF (paroxysmal atrial fibrillation) ([place]) 09/04/2021  ? Acute ST elevation myocardial infarction (STEMI) of anterior wall ([place]) 09/03/2021  ? Ventricular tachycardia ([place]) 09/03/2021  ? COVID-19 virus infection 09/03/2021  ? Breakthrough seizure ([place]) 03/19/2021  ? Seizure ([place]) 03/18/2021  ? Dementia due to Parkinson'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 behavioral disturbance ([place]) 08/16/2020  ? Fall 08/11/2020  ? Closed compression fracture of L5 lumbar vertebra, initial encounter ([place]) 08/11/2020  ? Seizures ([place]) 03/05/2018  ? Leukocytosis 03/05/2018  ? Diabetic foot ulcers ([place]) 04/23/2017  ? Parkinson disease ([place])   ? Gait instability   ? Diabetes mellitus type 2, controlled ([place]) 08/05/2015  ? Hypothyroidism 08/05/2015  ? Fall at home 10/02/2014  ? CNS aneurysm s/p coiling Rob Hickman, 2002) 10/02/2014  ? High grade T7 central canal stenosis with T7 vertebral fracture s/p fall (02/2012) 10/02/2014  ? 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 10/02/2014  ? Syncope 02/05/2014  ? Drug-induced delirium(292.81) 05/16/2013  ? Seizure disorder ([place]) 04/21/2013  ? Seizure disorder, grand mal ([place]) 03/24/2013  ? Cervical compression fracture---C7 10/29/2012  ? Dysphagia 09/21/2012  ? Subdural hematoma ([place]) 09/20/2012  ? SOLAR  KERATOSIS 02/17/2009  ? CARCINOMA, SKIN, SQUAMOUS CELL, FACE 04/19/2008  ? Hyperlipidemia with target LDL less than 70 01/27/2008  ? COLONIC POLYPS 11/15/2006  ? GERD 11/15/2006  ? ?PAST MEDICAL HISTORY:  ?Active Ambulatory Problems  ?  Diagnosis Date Noted  ? COLONIC POLYPS 11/15/2006  ? Hyperlipidemia with target LDL less than 70 01/27/2008  ? GERD 11/15/2006  ? SOLAR KERATOSIS 02/17/2009  ? CARCINOMA, SKIN, SQUAMOUS CELL, FACE 04/19/2008  ? Subdural hematoma ([place]) 09/20/2012  ? Dysphagia 09/21/2012  ? Cervical compression fracture---C7 10/29/2012  ? Seizure disorder, grand mal ([place]) 03/24/2013  ? Seizure disorder ([place]) 04/21/2013  ? Drug-induced delirium(292.81) 05/16/2013  ? Syncope 02/05/2014  ? Fall at home 10/02/2014  ? CNS aneurysm s/p coiling Rob Hickman, 2002) 10/02/2014  ? High grade T7 central canal stenosis with T7 vertebral fracture s/p fall (02/2012) 10/02/2014  ? 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 10/02/2014  ? Diabetes mellitus type 2, controlled ([place]) 08/05/2015  ? Hypothyroidism 08/05/2015  ? Parkinson disease ([place])   ? Gait instability   ? Diabetic foot ulcers ([place]) 04/23/2017  ? Seizures ([place]) 03/05/2018  ? Leukocytosis 03/05/2018  ? Fall 08/11/2020  ? Closed compression fracture of L5 lumbar vertebra, initial encounter (S[place]) 08/11/2020  ? Dementia due to Parkinson'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 behavioral disturbance ([place]) 08/16/2020  ? Seizure ([place]) 03/18/2021  ? Breakthrough seizure ([place]) 03/19/2021  ? Acute ST elevation myocardial infarction (STEMI) of anterior wall ([place]) 09/03/2021  ?  Ventricular tachycardia ([place]) 09/03/2021  ? COVID-19 virus infection 09/03/2021  ? PAF (paroxysmal atrial fibrillation) ([place]) 09/04/2021  ? Hypokalemia 09/05/2021  ? Hypomagnesemia 09/05/2021  ? ST elevation myocardial infarction (STEMI) ([place])   ? Goals of care, counseling/discussion   ? Palliative care patient 09/14/2021  ? ?Resolved Ambulatory Problems  ?  Diagnosis  Date Noted  ? Hypothyroidism 01/27/2008  ? Facial laceration 02/25/2012  ? Contusion, chest wall 02/25/2012  ? Respiratory failure, post-operative ([place]) 09/20/2012  ? Nontraumatic subdural hemorrhage ([place]) 04/16/2

## 2021-09-29 NOTE — Patient Outreach (Signed)
THN Post- Acute Care Coordinator follow up. Per [facility] eligible member currently resides in [facility].  ? ?Member's PCP at [facility] has [facility] care coordination services. Member is active with [facility] RN CCM services at PCP office.  ? ?Secure communication sent to facility SW to inquire about transition plans/needs.  ? ?Will continue to follow while member resides in SNF. ? ? ? ?Marthenia Rolling, MSN, RN,BSN ?[place] Coordinator ?[phone] [facility]) ?[phone]  (Toll free office)   ?

## 2021-10-02 ENCOUNTER — Other Ambulatory Visit: Payer: Self-pay | Admitting: *Deleted

## 2021-10-02 NOTE — Patient Outreach (Signed)
THN Post- Acute Care Coordinator follow up. Per [facility] THN eligible member currently resides in [facility].   ? ?Facility site visit to [facility] skilled nursing facility. Met with Deseree, SNF SW concerning transition plan and potential THN needs. Anticipated transition plan is for [facility] memory care. Likely transition this week.  ? ?Will update THN Embedded RN CM at PCP office with [facility]. ? ?Will continue to follow for transition date. ? ? , MSN, RN,BSN ?THN Post Acute Care Coordinator ?[phone] ( [employer]) ?[phone]  (Toll free office)  ?

## 2021-10-04 DIAGNOSIS — E119 Type 2 diabetes mellitus without complications: Secondary | ICD-10-CM | POA: Diagnosis not present

## 2021-10-04 DIAGNOSIS — I480 Paroxysmal atrial fibrillation: Secondary | ICD-10-CM | POA: Diagnosis not present

## 2021-10-04 DIAGNOSIS — I229 Subsequent ST elevation (STEMI) myocardial infarction of unspecified site: Secondary | ICD-10-CM | POA: Diagnosis not present

## 2021-10-04 DIAGNOSIS — R296 Repeated falls: Secondary | ICD-10-CM | POA: Diagnosis not present

## 2021-10-04 DIAGNOSIS — F0392 Unspecified dementia, unspecified severity, with psychotic disturbance: Secondary | ICD-10-CM | POA: Diagnosis not present

## 2021-10-04 DIAGNOSIS — E876 Hypokalemia: Secondary | ICD-10-CM | POA: Diagnosis not present

## 2021-10-04 DIAGNOSIS — I10 Essential (primary) hypertension: Secondary | ICD-10-CM | POA: Diagnosis not present

## 2021-10-04 DIAGNOSIS — W19XXXA Unspecified fall, initial encounter: Secondary | ICD-10-CM | POA: Diagnosis not present

## 2021-10-05 DIAGNOSIS — E782 Mixed hyperlipidemia: Secondary | ICD-10-CM | POA: Diagnosis not present

## 2021-10-05 DIAGNOSIS — E1151 Type 2 diabetes mellitus with diabetic peripheral angiopathy without gangrene: Secondary | ICD-10-CM | POA: Diagnosis not present

## 2021-10-05 DIAGNOSIS — I10 Essential (primary) hypertension: Secondary | ICD-10-CM | POA: Diagnosis not present

## 2021-10-05 DIAGNOSIS — E876 Hypokalemia: Secondary | ICD-10-CM | POA: Diagnosis not present

## 2021-10-05 DIAGNOSIS — M6281 Muscle weakness (generalized): Secondary | ICD-10-CM | POA: Diagnosis not present

## 2021-10-05 DIAGNOSIS — K219 Gastro-esophageal reflux disease without esophagitis: Secondary | ICD-10-CM | POA: Diagnosis not present

## 2021-10-05 DIAGNOSIS — G20 Parkinson's disease: Secondary | ICD-10-CM | POA: Diagnosis not present

## 2021-10-05 DIAGNOSIS — I480 Paroxysmal atrial fibrillation: Secondary | ICD-10-CM | POA: Diagnosis not present

## 2021-10-05 DIAGNOSIS — E038 Other specified hypothyroidism: Secondary | ICD-10-CM | POA: Diagnosis not present

## 2021-10-06 ENCOUNTER — Other Ambulatory Visit: Payer: Self-pay | Admitting: Adult Health

## 2021-10-06 NOTE — Telephone Encounter (Signed)
Noted  

## 2021-10-06 NOTE — Telephone Encounter (Signed)
This Rx ws d/c in ED. Will this be permanent or okay to fill now? Please advise. ?

## 2021-10-09 ENCOUNTER — Other Ambulatory Visit: Payer: Self-pay | Admitting: *Deleted

## 2021-10-09 NOTE — Patient Outreach (Addendum)
THN Post- Acute Care Coordinator follow up. Verified in [facility] Mr. Train transitioned from [facility] on April 21st.  ? ?Confirmed with Deseree, SNF SW with [facility] transitioned to [facility] memory care on Friday, April 21st. ? ?Notification sent to [facility] embedded care coordinator with PCP office.  ? ? ? ? ?Marthenia Rolling, MSN, RN,BSN ?[place] Coordinator ?[phone] [facility]) ?[phone]  (Toll free office)   ?

## 2021-10-11 DIAGNOSIS — G20 Parkinson's disease: Secondary | ICD-10-CM | POA: Diagnosis not present

## 2021-10-11 DIAGNOSIS — D518 Other vitamin B12 deficiency anemias: Secondary | ICD-10-CM | POA: Diagnosis not present

## 2021-10-11 DIAGNOSIS — E1151 Type 2 diabetes mellitus with diabetic peripheral angiopathy without gangrene: Secondary | ICD-10-CM | POA: Diagnosis not present

## 2021-10-11 DIAGNOSIS — I480 Paroxysmal atrial fibrillation: Secondary | ICD-10-CM | POA: Diagnosis not present

## 2021-10-11 DIAGNOSIS — M150 Primary generalized (osteo)arthritis: Secondary | ICD-10-CM | POA: Diagnosis not present

## 2021-10-11 DIAGNOSIS — E782 Mixed hyperlipidemia: Secondary | ICD-10-CM | POA: Diagnosis not present

## 2021-10-11 DIAGNOSIS — D508 Other iron deficiency anemias: Secondary | ICD-10-CM | POA: Diagnosis not present

## 2021-10-12 ENCOUNTER — Non-Acute Institutional Stay: Payer: Medicare Other | Admitting: Hospice

## 2021-10-12 DIAGNOSIS — E119 Type 2 diabetes mellitus without complications: Secondary | ICD-10-CM | POA: Diagnosis not present

## 2021-10-12 DIAGNOSIS — Z515 Encounter for palliative care: Secondary | ICD-10-CM | POA: Diagnosis not present

## 2021-10-12 DIAGNOSIS — R531 Weakness: Secondary | ICD-10-CM | POA: Diagnosis not present

## 2021-10-12 DIAGNOSIS — G20 Parkinson's disease: Secondary | ICD-10-CM | POA: Diagnosis not present

## 2021-10-12 DIAGNOSIS — F0280 Dementia in other diseases classified elsewhere without behavioral disturbance: Secondary | ICD-10-CM
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
## 2021-10-15 DIAGNOSIS — E1169 Type 2 diabetes mellitus with other specified complication: Secondary | ICD-10-CM

## 2021-10-15 DIAGNOSIS — F0280 Dementia in other diseases classified elsewhere without behavioral disturbance: Secondary | ICD-10-CM | POA: Diagnosis not present

## 2021-10-15 DIAGNOSIS — E785 Hyperlipidemia, unspecified: Secondary | ICD-10-CM | POA: Diagnosis not present

## 2021-10-19 DIAGNOSIS — R296 Repeated falls: Secondary | ICD-10-CM | POA: Diagnosis not present

## 2021-10-19 DIAGNOSIS — I4891 Unspecified atrial fibrillation: Secondary | ICD-10-CM | POA: Diagnosis not present

## 2021-10-19 DIAGNOSIS — E119 Type 2 diabetes mellitus without complications: Secondary | ICD-10-CM | POA: Diagnosis not present

## 2021-10-19 DIAGNOSIS — I10 Essential (primary) hypertension: Secondary | ICD-10-CM | POA: Diagnosis not present

## 2021-10-21 ENCOUNTER — Other Ambulatory Visit: Payer: Self-pay | Admitting: Adult Health

## 2021-10-27 DIAGNOSIS — Z8616 Personal history of COVID-19: Secondary | ICD-10-CM | POA: Diagnosis not present

## 2021-10-27 DIAGNOSIS — K219 Gastro-esophageal reflux disease without esophagitis: Secondary | ICD-10-CM | POA: Diagnosis not present

## 2021-10-27 DIAGNOSIS — Z7982 Long term (current) use of aspirin: Secondary | ICD-10-CM | POA: Diagnosis not present

## 2021-10-27 DIAGNOSIS — Z7902 Long term (current) use of antithrombotics/antiplatelets: Secondary | ICD-10-CM | POA: Diagnosis not present

## 2021-10-27 DIAGNOSIS — Z7984 Long term (current) use of oral hypoglycemic drugs: Secondary | ICD-10-CM | POA: Diagnosis not present

## 2021-10-27 DIAGNOSIS — F02811 Dementia in other diseases classified elsewhere, unspecified severity, with agitation: Secondary | ICD-10-CM | POA: Diagnosis not present

## 2021-10-27 DIAGNOSIS — R296 Repeated falls: Secondary | ICD-10-CM | POA: Diagnosis not present

## 2021-10-27 DIAGNOSIS — I480 Paroxysmal atrial fibrillation: Secondary | ICD-10-CM | POA: Diagnosis not present

## 2021-10-27 DIAGNOSIS — Z556 Problems related to health literacy: Secondary | ICD-10-CM | POA: Diagnosis not present

## 2021-10-27 DIAGNOSIS — I252 Old myocardial infarction: Secondary | ICD-10-CM | POA: Diagnosis not present

## 2021-10-27 DIAGNOSIS — G20 Parkinson's disease: Secondary | ICD-10-CM | POA: Diagnosis not present

## 2021-10-27 DIAGNOSIS — E039 Hypothyroidism, unspecified: Secondary | ICD-10-CM | POA: Diagnosis not present

## 2021-10-27 DIAGNOSIS — F0282 Dementia in other diseases classified elsewhere, unspecified severity, with psychotic disturbance: Secondary | ICD-10-CM | POA: Diagnosis not present

## 2021-10-27 DIAGNOSIS — E785 Hyperlipidemia, unspecified: Secondary | ICD-10-CM | POA: Diagnosis not present

## 2021-10-27 DIAGNOSIS — E119 Type 2 diabetes mellitus without complications: Secondary | ICD-10-CM | POA: Diagnosis not present

## 2021-10-27 DIAGNOSIS — I2510 Atherosclerotic heart disease of native coronary artery without angina pectoris: Secondary | ICD-10-CM | POA: Diagnosis not present

## 2021-10-27 DIAGNOSIS — Z9181 History of falling: Secondary | ICD-10-CM | POA: Diagnosis not present

## 2021-10-27 DIAGNOSIS — F02818 Dementia in other diseases classified elsewhere, unspecified severity, with other behavioral disturbance: Secondary | ICD-10-CM | POA: Diagnosis not present

## 2021-10-27 DIAGNOSIS — K635 Polyp of colon: Secondary | ICD-10-CM | POA: Diagnosis not present

## 2021-10-27 DIAGNOSIS — I10 Essential (primary) hypertension: Secondary | ICD-10-CM | POA: Diagnosis not present

## 2021-11-01 DIAGNOSIS — Z7902 Long term (current) use of antithrombotics/antiplatelets: Secondary | ICD-10-CM | POA: Diagnosis not present

## 2021-11-01 DIAGNOSIS — F0282 Dementia in other diseases classified elsewhere, unspecified severity, with psychotic disturbance: Secondary | ICD-10-CM | POA: Diagnosis not present

## 2021-11-01 DIAGNOSIS — Z556 Problems related to health literacy: Secondary | ICD-10-CM | POA: Diagnosis not present

## 2021-11-01 DIAGNOSIS — E119 Type 2 diabetes mellitus without complications: Secondary | ICD-10-CM | POA: Diagnosis not present

## 2021-11-01 DIAGNOSIS — Z7984 Long term (current) use of oral hypoglycemic drugs: Secondary | ICD-10-CM | POA: Diagnosis not present

## 2021-11-01 DIAGNOSIS — F02811 Dementia in other diseases classified elsewhere, unspecified severity, with agitation: Secondary | ICD-10-CM | POA: Diagnosis not present

## 2021-11-01 DIAGNOSIS — I252 Old myocardial infarction: Secondary | ICD-10-CM | POA: Diagnosis not present

## 2021-11-01 DIAGNOSIS — Z8616 Personal history of COVID-19: Secondary | ICD-10-CM | POA: Diagnosis not present

## 2021-11-01 DIAGNOSIS — I10 Essential (primary) hypertension: Secondary | ICD-10-CM | POA: Diagnosis not present

## 2021-11-01 DIAGNOSIS — F02818 Dementia in other diseases classified elsewhere, unspecified severity, with other behavioral disturbance: Secondary | ICD-10-CM | POA: Diagnosis not present

## 2021-11-01 DIAGNOSIS — R296 Repeated falls: Secondary | ICD-10-CM | POA: Diagnosis not present

## 2021-11-01 DIAGNOSIS — K219 Gastro-esophageal reflux disease without esophagitis: Secondary | ICD-10-CM | POA: Diagnosis not present

## 2021-11-01 DIAGNOSIS — E785 Hyperlipidemia, unspecified: Secondary | ICD-10-CM | POA: Diagnosis not present

## 2021-11-01 DIAGNOSIS — E039 Hypothyroidism, unspecified: Secondary | ICD-10-CM | POA: Diagnosis not present

## 2021-11-01 DIAGNOSIS — Z9181 History of falling: Secondary | ICD-10-CM | POA: Diagnosis not present

## 2021-11-01 DIAGNOSIS — Z7982 Long term (current) use of aspirin: Secondary | ICD-10-CM | POA: Diagnosis not present

## 2021-11-01 DIAGNOSIS — K635 Polyp of colon: Secondary | ICD-10-CM | POA: Diagnosis not present

## 2021-11-01 DIAGNOSIS — I480 Paroxysmal atrial fibrillation: Secondary | ICD-10-CM | POA: Diagnosis not present

## 2021-11-01 DIAGNOSIS — I2510 Atherosclerotic heart disease of native coronary artery without angina pectoris: Secondary | ICD-10-CM | POA: Diagnosis not present

## 2021-11-01 DIAGNOSIS — G20 Parkinson's disease: Secondary | ICD-10-CM | POA: Diagnosis not present

## 2021-11-02 ENCOUNTER — Ambulatory Visit (INDEPENDENT_AMBULATORY_CARE_PROVIDER_SITE_OTHER): Payer: Medicare Other

## 2021-11-02 DIAGNOSIS — Z7982 Long term (current) use of aspirin: Secondary | ICD-10-CM | POA: Diagnosis not present

## 2021-11-02 DIAGNOSIS — Z556 Problems related to health literacy: Secondary | ICD-10-CM | POA: Diagnosis not present

## 2021-11-02 DIAGNOSIS — I10 Essential (primary) hypertension: Secondary | ICD-10-CM | POA: Diagnosis not present

## 2021-11-02 DIAGNOSIS — Z8616 Personal history of COVID-19: Secondary | ICD-10-CM | POA: Diagnosis not present

## 2021-11-02 DIAGNOSIS — K635 Polyp of colon: Secondary | ICD-10-CM | POA: Diagnosis not present

## 2021-11-02 DIAGNOSIS — F0280 Dementia in other diseases classified elsewhere without behavioral disturbance: Secondary | ICD-10-CM

## 2021-11-02 DIAGNOSIS — E785 Hyperlipidemia, unspecified: Secondary | ICD-10-CM | POA: Diagnosis not present

## 2021-11-02 DIAGNOSIS — F02811 Dementia in other diseases classified elsewhere, unspecified severity, with agitation: Secondary | ICD-10-CM | POA: Diagnosis not present

## 2021-11-02 DIAGNOSIS — Z7902 Long term (current) use of antithrombotics/antiplatelets: Secondary | ICD-10-CM | POA: Diagnosis not present

## 2021-11-02 DIAGNOSIS — F02818 Dementia in other diseases classified elsewhere, unspecified severity, with other behavioral disturbance: Secondary | ICD-10-CM | POA: Diagnosis not present

## 2021-11-02 DIAGNOSIS — Z9181 History of falling: Secondary | ICD-10-CM | POA: Diagnosis not present

## 2021-11-02 DIAGNOSIS — E039 Hypothyroidism, unspecified: Secondary | ICD-10-CM | POA: Diagnosis not present

## 2021-11-02 DIAGNOSIS — I2510 Atherosclerotic heart disease of native coronary artery without angina pectoris: Secondary | ICD-10-CM | POA: Diagnosis not present

## 2021-11-02 DIAGNOSIS — R296 Repeated falls: Secondary | ICD-10-CM | POA: Diagnosis not present

## 2021-11-02 DIAGNOSIS — I252 Old myocardial infarction: Secondary | ICD-10-CM | POA: Diagnosis not present

## 2021-11-02 DIAGNOSIS — E119 Type 2 diabetes mellitus without complications: Secondary | ICD-10-CM

## 2021-11-02 DIAGNOSIS — G20 Parkinson's disease: Secondary | ICD-10-CM

## 2021-11-02 DIAGNOSIS — F0282 Dementia in other diseases classified elsewhere, unspecified severity, with psychotic disturbance: Secondary | ICD-10-CM | POA: Diagnosis not present

## 2021-11-02 DIAGNOSIS — K219 Gastro-esophageal reflux disease without esophagitis: Secondary | ICD-10-CM | POA: Diagnosis not present

## 2021-11-02 DIAGNOSIS — Z7984 Long term (current) use of oral hypoglycemic drugs: Secondary | ICD-10-CM | POA: Diagnosis not present

## 2021-11-02 DIAGNOSIS — I480 Paroxysmal atrial fibrillation: Secondary | ICD-10-CM | POA: Diagnosis not present

## 2021-11-02 NOTE — Patient Instructions (Signed)
Visit Information  Thank you for allowing me to share the care management and care coordination services that are available to you as part of your health plan and services through your primary care provider and medical home. Please reach out to me at [phone] if the care management/care coordination team may be of assistance to you in the future.   Monterrius Cardosa RN, BSN,CCM, CDE Care Management Coordinator [facility] [phone]   

## 2021-11-02 NOTE — Chronic Care Management (AMB) (Signed)
Chronic Care Management   CCM RN Visit Note  11/02/2021 Name: Gerald Leach MRN: [id number] DOB: 1933-12-20  Subjective: Gerald Leach is a 88 y.o. year old male who is a primary care patient of [name], NP. The care management team was consulted for assistance with disease management and care coordination needs.    Engaged with patient by telephone for follow up visit in response to provider referral for case management and/or care coordination services.   Consent to Services:  The patient was given information about Chronic Care Management services, agreed to services, and gave verbal consent prior to initiation of services.  Please see initial visit note for detailed documentation.   Patient agreed to services and verbal consent obtained.   Assessment: Review of patient past medical history, allergies, medications, health status, including review of consultants reports, laboratory and other test data, was performed as part of comprehensive evaluation and provision of chronic care management services.   SDOH (Social Determinants of Health) assessments and interventions performed:    CCM Care Plan  No Known Allergies  Outpatient Encounter Medications as of 11/02/2021  Medication Sig Note   acetaminophen (TYLENOL) 325 MG tablet Take 2 tablets (650 mg total) by mouth every 6 (six) hours as needed for mild pain (or Fever >/= 101).    amantadine (SYMMETREL) 100 MG capsule Take 1 capsule (100 mg total) by mouth daily. (Patient taking differently: Take 100 mg by mouth at bedtime.)    amiodarone (PACERONE) 200 MG tablet Take 1 tablet (200 mg total) by mouth daily.    aspirin EC 81 MG EC tablet Take 1 tablet (81 mg total) by mouth daily. Swallow whole.    atorvastatin (LIPITOR) 40 MG tablet Take 1 tablet (40 mg total) by mouth daily.    carbidopa-levodopa (SINEMET IR) 25-100 MG tablet TAKE 1 TABLET BY MOUTH THREE TIMES A DAY (Patient taking differently: Take 1 tablet by mouth 3 (three)  times daily.)    clopidogrel (PLAVIX) 75 MG tablet Take 1 tablet (75 mg total) by mouth daily.    ferrous sulfate 325 (65 FE) MG tablet Take 325 mg by mouth daily with breakfast.    Lacosamide 150 MG TABS Take 1 tablet (150 mg total) by mouth 2 (two) times daily.    levETIRAcetam (KEPPRA) 750 MG tablet TAKE 2 TABLETS (1,500 MG TOTAL) BY MOUTH 2 (TWO) TIMES DAILY. 09/03/2021: LF 09/02/21 #180   levothyroxine (SYNTHROID) 75 MCG tablet TAKE 1 TABLET BY MOUTH EVERY DAY (Patient taking differently: Take 75 mcg by mouth daily before breakfast.)    LORazepam (ATIVAN) 1 MG tablet Take 1 tablet (1 mg total) by mouth every 6 (six) hours as needed for anxiety (dyspnea).    Morphine Sulfate (MORPHINE CONCENTRATE) 10 MG/0.5ML SOLN concentrated solution Take 0.25 mLs (5 mg total) by mouth every 4 (four) hours as needed for moderate pain (or dyspnea).    nitroGLYCERIN (NITROSTAT) 0.4 MG SL tablet Place 1 tablet (0.4 mg total) under the tongue every 5 (five) minutes x 3 doses as needed for chest pain.    omeprazole (PRILOSEC) 20 MG capsule TAKE 1 CAPSULE BY MOUTH EVERY DAY (Patient taking differently: Take 20 mg by mouth daily.)    ondansetron (ZOFRAN) 4 MG tablet Take 1 tablet (4 mg total) by mouth every 6 (six) hours as needed for nausea.    vitamin B-12 (CYANOCOBALAMIN) 1000 MCG tablet Take 1,000 mcg by mouth daily.    No facility-administered encounter medications on file as of 11/02/2021.  Patient Active Problem List   Diagnosis Date Noted   Palliative care patient 09/14/2021   Hypokalemia 09/05/2021   Hypomagnesemia 09/05/2021   ST elevation myocardial infarction (STEMI) ([place])    Goals of care, counseling/discussion    PAF (paroxysmal atrial fibrillation) ([place]) 09/04/2021   Acute ST elevation myocardial infarction (STEMI) of anterior wall ([place]) 09/03/2021   Ventricular tachycardia ([place]) 09/03/2021   COVID-19 virus infection 09/03/2021   Breakthrough seizure ([place]) 03/19/2021   Seizure ([place])  03/18/2021   Dementia due to Parkinson'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 behavioral disturbance ([place]) 08/16/2020   Fall 08/11/2020   Closed compression fracture of L5 lumbar vertebra, initial encounter ([place]) 08/11/2020   Seizures ([place]) 03/05/2018   Leukocytosis 03/05/2018   Diabetic foot ulcers ([place]) 04/23/2017   Parkinson disease ([place])    Gait instability    Diabetes mellitus type 2, controlled ([place]) 08/05/2015   Hypothyroidism 08/05/2015   Fall at home 10/02/2014   CNS aneurysm s/p coiling ([facility], 2002) 10/02/2014   High grade T7 central canal stenosis with T7 vertebral fracture s/p fall (02/2012) 10/02/2014   Right clinoid calcified meningioma vs thrombosed giant ICA aneurysm, conservative management. 10/02/2014   Syncope 02/05/2014   Drug-induced delirium(292.81) 05/16/2013   Seizure disorder ([place]) 04/21/2013   Seizure disorder, grand mal ([place]) 03/24/2013   Cervical compression fracture---C7 10/29/2012   Dysphagia 09/21/2012   Subdural hematoma ([place]) 09/20/2012   SOLAR KERATOSIS 02/17/2009   CARCINOMA, SKIN, SQUAMOUS CELL, FACE 04/19/2008   Hyperlipidemia with target LDL less than 70 01/27/2008   COLONIC POLYPS 11/15/2006   GERD 11/15/2006    Conditions to be addressed/monitored:HTN, DMII, and Parkinson's disease  Care Plan : RN Care Manager Plan of Care  Updates made by Dimitri Ped, RN since 11/02/2021 12:00 AM  Completed 11/02/2021   Problem: Chronic Disease Management and Care Coordination Needs (Seizures, Parkinson disease, DM. HLD) Resolved 11/02/2021  Priority: High     Long-Range Goal: Establish Plan of Care for Chronic Disease Management Needs (Seizures, Parkinson disease, DM. HLD) Completed 11/02/2021  Start Date: 05/18/2021  Expected End Date: 11/14/2021  Priority: High  Note:   Case closed transferring to new provider at facility Current Barriers:  Care Coordination needs related to ADL IADL limitations, Memory Deficits, Inability to perform ADL's independently, and  Inability to perform IADL's independently Chronic Disease Management support and education needs related to HLD, DMII, and Parkinson disease, seizures,dementia  Cognitive Deficits Spoke with son Yancarlos Berthold.     States that pt is gettting settled at [facility].  States he is eating well but his memory continues to worsen. States Palliative care is following  him now. States he is changing to the [employer] care providers at the facility  [facility]):  Patient will verbalize basic understanding of  HLD, DMII, and Parkinson disease, seizures  disease process and self health management plan as evidenced by voiced adherence to plan of care take all medications exactly as prescribed and will call provider for medication related questions as evidenced by dispense report and pt verbalization attend all scheduled medical appointments: none scheduled as evidenced by review of medical records demonstrate Improved adherence to prescribed treatment plan for HLD, DMII, and Parkinson disease, seizures, dementia  as evidenced by readings within limits, voiced adherence to plan of care continue to work with RN Care Manager to address care management and care coordination needs related to  HLD, DMII, and Parkinson disease, seizures, dementia  as evidenced by adherence to CM Team Scheduled appointments  through collaboration with Consulting civil engineer, provider, and care team.   Interventions: 1:1 collaboration with primary care provider regarding development and update of comprehensive plan of care as evidenced by provider attestation and co-signature Inter-disciplinary care team collaboration (see longitudinal plan of care) Evaluation of current treatment plan related to  self management and patient's adherence to plan as established by provider Reviewed with son his options and the differences in long term care and assisted living. Encouraged to work with facility SW and Palliative care on his next  options Instructed Case will be closed  due to transferring to new provider at facility  Dementia:  (Status:  New goal. and Goal Met.)  Long Term Goal Evaluation of current treatment plan related to misuse of: Parkinson's Disease dementia Case closed transferring to new provider at facility   Diabetes Interventions:  (Status:  Goal Met.) Long Term Goal Assessed patient's understanding of A1c goal: <7% Case closed transferring to new provider at facility Lab Results  Component Value Date   HGBA1C 5.3 09/03/2021   Falls Interventions:  (Status:  Goal Met.) Long Term Goal Reviewed medications and discussed potential side effects of medications such as dizziness and frequent urination Advised patient of importance of notifying provider of falls Assessed for falls since last encounter Assessed patients knowledge of fall risk prevention secondary to previously provided education Reviewed to encourage pt cooperate with his therapy at the facility    Seizures  (Status:  Goal Met.)  Long Term Goal Evaluation of current treatment plan related to  Seizures , ADL IADL limitations and Memory Deficits self-management and patient's adherence to plan as established by provider. Discussed plans with patient for ongoing care management follow up and provided patient with direct contact information for care management team Evaluation of current treatment plan related to seizures and patient's adherence to plan as established by provider Advised patient to call to schedule follow up with neurologist Discussed plans with patient for ongoing care management follow up and provided patient with direct contact information for care management team Reviewed importance of taking seizure medications as ordered.  Hyperlipidemia Interventions:  (Status:  Goal Met.) Long Term Goal Medication review performed; medication list updated in electronic medical record.  Provider established cholesterol goals  reviewed Counseled on importance of regular laboratory monitoring as prescribed Reviewed importance of limiting foods high in cholesterol  Patient Goals/Self-Care Activities: Take all medications as prescribed Attend all scheduled provider appointments Call provider office for new concerns or questions  check blood sugar at prescribed times: when you have symptoms of low or high blood sugar and 1-2 times a week check feet daily for cuts, sores or redness take the blood sugar log to all doctor visits drink 6 to 8 glasses of water each day wash and dry feet carefully every day call for medicine refill 2 or 3 days before it runs out take all medications exactly as prescribed call doctor with any symptoms you believe are related to your medicine   Follow Up Plan:  No further follow up required: Case closed transferring to new provider at facility       Plan:No further follow up required: Case closed transferring to new provider at facility [place], [facility], CDE Care Management Coordinator [facility] [phone]

## 2021-11-03 DIAGNOSIS — I2109 ST elevation (STEMI) myocardial infarction involving other coronary artery of anterior wall: Secondary | ICD-10-CM | POA: Diagnosis not present

## 2021-11-03 DIAGNOSIS — G20 Parkinson's disease: Secondary | ICD-10-CM | POA: Diagnosis not present

## 2021-11-03 DIAGNOSIS — I480 Paroxysmal atrial fibrillation: Secondary | ICD-10-CM | POA: Diagnosis not present

## 2021-11-05 DIAGNOSIS — I2109 ST elevation (STEMI) myocardial infarction involving other coronary artery of anterior wall: Secondary | ICD-10-CM | POA: Diagnosis not present

## 2021-11-05 DIAGNOSIS — I480 Paroxysmal atrial fibrillation: Secondary | ICD-10-CM | POA: Diagnosis not present

## 2021-11-05 DIAGNOSIS — G20 Parkinson's disease: Secondary | ICD-10-CM | POA: Diagnosis not present

## 2021-11-07 DIAGNOSIS — R296 Repeated falls: Secondary | ICD-10-CM | POA: Diagnosis not present

## 2021-11-07 DIAGNOSIS — I10 Essential (primary) hypertension: Secondary | ICD-10-CM | POA: Diagnosis not present

## 2021-11-07 DIAGNOSIS — F0282 Dementia in other diseases classified elsewhere, unspecified severity, with psychotic disturbance: Secondary | ICD-10-CM | POA: Diagnosis not present

## 2021-11-07 DIAGNOSIS — Z7982 Long term (current) use of aspirin: Secondary | ICD-10-CM | POA: Diagnosis not present

## 2021-11-07 DIAGNOSIS — K635 Polyp of colon: Secondary | ICD-10-CM | POA: Diagnosis not present

## 2021-11-07 DIAGNOSIS — Z7984 Long term (current) use of oral hypoglycemic drugs: Secondary | ICD-10-CM | POA: Diagnosis not present

## 2021-11-07 DIAGNOSIS — Z556 Problems related to health literacy: Secondary | ICD-10-CM | POA: Diagnosis not present

## 2021-11-07 DIAGNOSIS — I252 Old myocardial infarction: Secondary | ICD-10-CM | POA: Diagnosis not present

## 2021-11-07 DIAGNOSIS — Z7902 Long term (current) use of antithrombotics/antiplatelets: Secondary | ICD-10-CM | POA: Diagnosis not present

## 2021-11-07 DIAGNOSIS — I2510 Atherosclerotic heart disease of native coronary artery without angina pectoris: Secondary | ICD-10-CM | POA: Diagnosis not present

## 2021-11-07 DIAGNOSIS — Z8616 Personal history of COVID-19: Secondary | ICD-10-CM | POA: Diagnosis not present

## 2021-11-07 DIAGNOSIS — K219 Gastro-esophageal reflux disease without esophagitis: Secondary | ICD-10-CM | POA: Diagnosis not present

## 2021-11-07 DIAGNOSIS — E785 Hyperlipidemia, unspecified: Secondary | ICD-10-CM | POA: Diagnosis not present

## 2021-11-07 DIAGNOSIS — F02818 Dementia in other diseases classified elsewhere, unspecified severity, with other behavioral disturbance: Secondary | ICD-10-CM | POA: Diagnosis not present

## 2021-11-07 DIAGNOSIS — F02811 Dementia in other diseases classified elsewhere, unspecified severity, with agitation: Secondary | ICD-10-CM | POA: Diagnosis not present

## 2021-11-07 DIAGNOSIS — I480 Paroxysmal atrial fibrillation: Secondary | ICD-10-CM | POA: Diagnosis not present

## 2021-11-07 DIAGNOSIS — G20 Parkinson's disease: Secondary | ICD-10-CM | POA: Diagnosis not present

## 2021-11-07 DIAGNOSIS — Z9181 History of falling: Secondary | ICD-10-CM | POA: Diagnosis not present

## 2021-11-07 DIAGNOSIS — E119 Type 2 diabetes mellitus without complications: Secondary | ICD-10-CM | POA: Diagnosis not present

## 2021-11-07 DIAGNOSIS — E039 Hypothyroidism, unspecified: Secondary | ICD-10-CM | POA: Diagnosis not present

## 2021-11-08 DIAGNOSIS — G20 Parkinson's disease: Secondary | ICD-10-CM | POA: Diagnosis not present

## 2021-11-08 DIAGNOSIS — Z8669 Personal history of other diseases of the nervous system and sense organs: Secondary | ICD-10-CM | POA: Diagnosis not present

## 2021-11-08 DIAGNOSIS — D508 Other iron deficiency anemias: Secondary | ICD-10-CM | POA: Diagnosis not present

## 2021-11-08 DIAGNOSIS — K219 Gastro-esophageal reflux disease without esophagitis: Secondary | ICD-10-CM | POA: Diagnosis not present

## 2021-11-08 DIAGNOSIS — I480 Paroxysmal atrial fibrillation: Secondary | ICD-10-CM | POA: Diagnosis not present

## 2021-11-09 DIAGNOSIS — F02818 Dementia in other diseases classified elsewhere, unspecified severity, with other behavioral disturbance: Secondary | ICD-10-CM | POA: Diagnosis not present

## 2021-11-09 DIAGNOSIS — I252 Old myocardial infarction: Secondary | ICD-10-CM | POA: Diagnosis not present

## 2021-11-09 DIAGNOSIS — I10 Essential (primary) hypertension: Secondary | ICD-10-CM | POA: Diagnosis not present

## 2021-11-09 DIAGNOSIS — E119 Type 2 diabetes mellitus without complications: Secondary | ICD-10-CM | POA: Diagnosis not present

## 2021-11-09 DIAGNOSIS — F0282 Dementia in other diseases classified elsewhere, unspecified severity, with psychotic disturbance: Secondary | ICD-10-CM | POA: Diagnosis not present

## 2021-11-09 DIAGNOSIS — Z7902 Long term (current) use of antithrombotics/antiplatelets: Secondary | ICD-10-CM | POA: Diagnosis not present

## 2021-11-09 DIAGNOSIS — K219 Gastro-esophageal reflux disease without esophagitis: Secondary | ICD-10-CM | POA: Diagnosis not present

## 2021-11-09 DIAGNOSIS — G20 Parkinson's disease: Secondary | ICD-10-CM | POA: Diagnosis not present

## 2021-11-09 DIAGNOSIS — E785 Hyperlipidemia, unspecified: Secondary | ICD-10-CM | POA: Diagnosis not present

## 2021-11-09 DIAGNOSIS — Z9181 History of falling: Secondary | ICD-10-CM | POA: Diagnosis not present

## 2021-11-09 DIAGNOSIS — Z7984 Long term (current) use of oral hypoglycemic drugs: Secondary | ICD-10-CM | POA: Diagnosis not present

## 2021-11-09 DIAGNOSIS — E039 Hypothyroidism, unspecified: Secondary | ICD-10-CM | POA: Diagnosis not present

## 2021-11-09 DIAGNOSIS — K635 Polyp of colon: Secondary | ICD-10-CM | POA: Diagnosis not present

## 2021-11-09 DIAGNOSIS — Z556 Problems related to health literacy: Secondary | ICD-10-CM | POA: Diagnosis not present

## 2021-11-09 DIAGNOSIS — I2510 Atherosclerotic heart disease of native coronary artery without angina pectoris: Secondary | ICD-10-CM | POA: Diagnosis not present

## 2021-11-09 DIAGNOSIS — Z7982 Long term (current) use of aspirin: Secondary | ICD-10-CM | POA: Diagnosis not present

## 2021-11-09 DIAGNOSIS — R296 Repeated falls: Secondary | ICD-10-CM | POA: Diagnosis not present

## 2021-11-09 DIAGNOSIS — F02811 Dementia in other diseases classified elsewhere, unspecified severity, with agitation: Secondary | ICD-10-CM | POA: Diagnosis not present

## 2021-11-09 DIAGNOSIS — Z8616 Personal history of COVID-19: Secondary | ICD-10-CM | POA: Diagnosis not present

## 2021-11-09 DIAGNOSIS — I480 Paroxysmal atrial fibrillation: Secondary | ICD-10-CM | POA: Diagnosis not present

## 2021-11-15 DIAGNOSIS — F0390 Unspecified dementia without behavioral disturbance: Secondary | ICD-10-CM

## 2021-11-15 DIAGNOSIS — E785 Hyperlipidemia, unspecified: Secondary | ICD-10-CM

## 2021-11-15 DIAGNOSIS — E1169 Type 2 diabetes mellitus with other specified complication: Secondary | ICD-10-CM

## 2021-11-17 DIAGNOSIS — E785 Hyperlipidemia, unspecified: Secondary | ICD-10-CM | POA: Diagnosis not present

## 2021-11-17 DIAGNOSIS — F0282 Dementia in other diseases classified elsewhere, unspecified severity, with psychotic disturbance: Secondary | ICD-10-CM | POA: Diagnosis not present

## 2021-11-17 DIAGNOSIS — I252 Old myocardial infarction: Secondary | ICD-10-CM | POA: Diagnosis not present

## 2021-11-17 DIAGNOSIS — K219 Gastro-esophageal reflux disease without esophagitis: Secondary | ICD-10-CM | POA: Diagnosis not present

## 2021-11-17 DIAGNOSIS — Z7982 Long term (current) use of aspirin: Secondary | ICD-10-CM | POA: Diagnosis not present

## 2021-11-17 DIAGNOSIS — F02811 Dementia in other diseases classified elsewhere, unspecified severity, with agitation: Secondary | ICD-10-CM | POA: Diagnosis not present

## 2021-11-17 DIAGNOSIS — Z556 Problems related to health literacy: Secondary | ICD-10-CM | POA: Diagnosis not present

## 2021-11-17 DIAGNOSIS — E119 Type 2 diabetes mellitus without complications: Secondary | ICD-10-CM | POA: Diagnosis not present

## 2021-11-17 DIAGNOSIS — F02818 Dementia in other diseases classified elsewhere, unspecified severity, with other behavioral disturbance: Secondary | ICD-10-CM | POA: Diagnosis not present

## 2021-11-17 DIAGNOSIS — R296 Repeated falls: Secondary | ICD-10-CM | POA: Diagnosis not present

## 2021-11-17 DIAGNOSIS — Z8616 Personal history of COVID-19: Secondary | ICD-10-CM | POA: Diagnosis not present

## 2021-11-17 DIAGNOSIS — I480 Paroxysmal atrial fibrillation: Secondary | ICD-10-CM | POA: Diagnosis not present

## 2021-11-17 DIAGNOSIS — Z7984 Long term (current) use of oral hypoglycemic drugs: Secondary | ICD-10-CM | POA: Diagnosis not present

## 2021-11-17 DIAGNOSIS — K635 Polyp of colon: Secondary | ICD-10-CM | POA: Diagnosis not present

## 2021-11-17 DIAGNOSIS — I2510 Atherosclerotic heart disease of native coronary artery without angina pectoris: Secondary | ICD-10-CM | POA: Diagnosis not present

## 2021-11-17 DIAGNOSIS — G20 Parkinson's disease: Secondary | ICD-10-CM | POA: Diagnosis not present

## 2021-11-17 DIAGNOSIS — E039 Hypothyroidism, unspecified: Secondary | ICD-10-CM | POA: Diagnosis not present

## 2021-11-17 DIAGNOSIS — Z9181 History of falling: Secondary | ICD-10-CM | POA: Diagnosis not present

## 2021-11-17 DIAGNOSIS — I10 Essential (primary) hypertension: Secondary | ICD-10-CM | POA: Diagnosis not present

## 2021-11-17 DIAGNOSIS — Z7902 Long term (current) use of antithrombotics/antiplatelets: Secondary | ICD-10-CM | POA: Diagnosis not present

## 2021-11-21 DIAGNOSIS — E039 Hypothyroidism, unspecified: Secondary | ICD-10-CM | POA: Diagnosis not present

## 2021-11-21 DIAGNOSIS — F02818 Dementia in other diseases classified elsewhere, unspecified severity, with other behavioral disturbance: Secondary | ICD-10-CM | POA: Diagnosis not present

## 2021-11-21 DIAGNOSIS — R296 Repeated falls: Secondary | ICD-10-CM | POA: Diagnosis not present

## 2021-11-21 DIAGNOSIS — E119 Type 2 diabetes mellitus without complications: Secondary | ICD-10-CM | POA: Diagnosis not present

## 2021-11-21 DIAGNOSIS — Z7984 Long term (current) use of oral hypoglycemic drugs: Secondary | ICD-10-CM | POA: Diagnosis not present

## 2021-11-21 DIAGNOSIS — K219 Gastro-esophageal reflux disease without esophagitis: Secondary | ICD-10-CM | POA: Diagnosis not present

## 2021-11-21 DIAGNOSIS — Z556 Problems related to health literacy: Secondary | ICD-10-CM | POA: Diagnosis not present

## 2021-11-21 DIAGNOSIS — K635 Polyp of colon: Secondary | ICD-10-CM | POA: Diagnosis not present

## 2021-11-21 DIAGNOSIS — Z8616 Personal history of COVID-19: Secondary | ICD-10-CM | POA: Diagnosis not present

## 2021-11-21 DIAGNOSIS — I252 Old myocardial infarction: Secondary | ICD-10-CM | POA: Diagnosis not present

## 2021-11-21 DIAGNOSIS — Z7982 Long term (current) use of aspirin: Secondary | ICD-10-CM | POA: Diagnosis not present

## 2021-11-21 DIAGNOSIS — F02811 Dementia in other diseases classified elsewhere, unspecified severity, with agitation: Secondary | ICD-10-CM | POA: Diagnosis not present

## 2021-11-21 DIAGNOSIS — I480 Paroxysmal atrial fibrillation: Secondary | ICD-10-CM | POA: Diagnosis not present

## 2021-11-21 DIAGNOSIS — F0282 Dementia in other diseases classified elsewhere, unspecified severity, with psychotic disturbance: Secondary | ICD-10-CM | POA: Diagnosis not present

## 2021-11-21 DIAGNOSIS — Z9181 History of falling: Secondary | ICD-10-CM | POA: Diagnosis not present

## 2021-11-21 DIAGNOSIS — Z7902 Long term (current) use of antithrombotics/antiplatelets: Secondary | ICD-10-CM | POA: Diagnosis not present

## 2021-11-21 DIAGNOSIS — E785 Hyperlipidemia, unspecified: Secondary | ICD-10-CM | POA: Diagnosis not present

## 2021-11-21 DIAGNOSIS — G20 Parkinson's disease: Secondary | ICD-10-CM | POA: Diagnosis not present

## 2021-11-21 DIAGNOSIS — I2510 Atherosclerotic heart disease of native coronary artery without angina pectoris: Secondary | ICD-10-CM | POA: Diagnosis not present

## 2021-11-21 DIAGNOSIS — I10 Essential (primary) hypertension: Secondary | ICD-10-CM | POA: Diagnosis not present

## 2021-11-22 DIAGNOSIS — G20 Parkinson's disease: Secondary | ICD-10-CM | POA: Diagnosis not present

## 2021-11-22 DIAGNOSIS — I480 Paroxysmal atrial fibrillation: Secondary | ICD-10-CM | POA: Diagnosis not present

## 2021-11-22 DIAGNOSIS — W010XXA Fall on same level from slipping, tripping and stumbling without subsequent striking against object, initial encounter: Secondary | ICD-10-CM | POA: Diagnosis not present

## 2021-11-24 DIAGNOSIS — R296 Repeated falls: Secondary | ICD-10-CM | POA: Diagnosis not present

## 2021-11-24 DIAGNOSIS — Z9181 History of falling: Secondary | ICD-10-CM | POA: Diagnosis not present

## 2021-11-24 DIAGNOSIS — K219 Gastro-esophageal reflux disease without esophagitis: Secondary | ICD-10-CM | POA: Diagnosis not present

## 2021-11-24 DIAGNOSIS — Z8616 Personal history of COVID-19: Secondary | ICD-10-CM | POA: Diagnosis not present

## 2021-11-24 DIAGNOSIS — Z556 Problems related to health literacy: Secondary | ICD-10-CM | POA: Diagnosis not present

## 2021-11-24 DIAGNOSIS — E785 Hyperlipidemia, unspecified: Secondary | ICD-10-CM | POA: Diagnosis not present

## 2021-11-24 DIAGNOSIS — K635 Polyp of colon: Secondary | ICD-10-CM | POA: Diagnosis not present

## 2021-11-24 DIAGNOSIS — I480 Paroxysmal atrial fibrillation: Secondary | ICD-10-CM | POA: Diagnosis not present

## 2021-11-24 DIAGNOSIS — F0282 Dementia in other diseases classified elsewhere, unspecified severity, with psychotic disturbance: Secondary | ICD-10-CM | POA: Diagnosis not present

## 2021-11-24 DIAGNOSIS — Z7982 Long term (current) use of aspirin: Secondary | ICD-10-CM | POA: Diagnosis not present

## 2021-11-24 DIAGNOSIS — I252 Old myocardial infarction: Secondary | ICD-10-CM | POA: Diagnosis not present

## 2021-11-24 DIAGNOSIS — Z7902 Long term (current) use of antithrombotics/antiplatelets: Secondary | ICD-10-CM | POA: Diagnosis not present

## 2021-11-24 DIAGNOSIS — E039 Hypothyroidism, unspecified: Secondary | ICD-10-CM | POA: Diagnosis not present

## 2021-11-24 DIAGNOSIS — E119 Type 2 diabetes mellitus without complications: Secondary | ICD-10-CM | POA: Diagnosis not present

## 2021-11-24 DIAGNOSIS — F02811 Dementia in other diseases classified elsewhere, unspecified severity, with agitation: Secondary | ICD-10-CM | POA: Diagnosis not present

## 2021-11-24 DIAGNOSIS — G20 Parkinson's disease: Secondary | ICD-10-CM | POA: Diagnosis not present

## 2021-11-24 DIAGNOSIS — I2510 Atherosclerotic heart disease of native coronary artery without angina pectoris: Secondary | ICD-10-CM | POA: Diagnosis not present

## 2021-11-24 DIAGNOSIS — F02818 Dementia in other diseases classified elsewhere, unspecified severity, with other behavioral disturbance: Secondary | ICD-10-CM | POA: Diagnosis not present

## 2021-11-24 DIAGNOSIS — Z7984 Long term (current) use of oral hypoglycemic drugs: Secondary | ICD-10-CM | POA: Diagnosis not present

## 2021-11-24 DIAGNOSIS — I10 Essential (primary) hypertension: Secondary | ICD-10-CM | POA: Diagnosis not present

## 2021-11-28 DIAGNOSIS — I2510 Atherosclerotic heart disease of native coronary artery without angina pectoris: Secondary | ICD-10-CM | POA: Diagnosis not present

## 2021-11-28 DIAGNOSIS — Z7984 Long term (current) use of oral hypoglycemic drugs: Secondary | ICD-10-CM | POA: Diagnosis not present

## 2021-11-28 DIAGNOSIS — F0282 Dementia in other diseases classified elsewhere, unspecified severity, with psychotic disturbance: Secondary | ICD-10-CM | POA: Diagnosis not present

## 2021-11-28 DIAGNOSIS — I252 Old myocardial infarction: Secondary | ICD-10-CM | POA: Diagnosis not present

## 2021-11-28 DIAGNOSIS — Z7902 Long term (current) use of antithrombotics/antiplatelets: Secondary | ICD-10-CM | POA: Diagnosis not present

## 2021-11-28 DIAGNOSIS — Z9181 History of falling: Secondary | ICD-10-CM | POA: Diagnosis not present

## 2021-11-28 DIAGNOSIS — Z7982 Long term (current) use of aspirin: Secondary | ICD-10-CM | POA: Diagnosis not present

## 2021-11-28 DIAGNOSIS — K219 Gastro-esophageal reflux disease without esophagitis: Secondary | ICD-10-CM | POA: Diagnosis not present

## 2021-11-28 DIAGNOSIS — G20 Parkinson's disease: Secondary | ICD-10-CM | POA: Diagnosis not present

## 2021-11-28 DIAGNOSIS — E785 Hyperlipidemia, unspecified: Secondary | ICD-10-CM | POA: Diagnosis not present

## 2021-11-28 DIAGNOSIS — I480 Paroxysmal atrial fibrillation: Secondary | ICD-10-CM | POA: Diagnosis not present

## 2021-11-28 DIAGNOSIS — E119 Type 2 diabetes mellitus without complications: Secondary | ICD-10-CM | POA: Diagnosis not present

## 2021-11-28 DIAGNOSIS — F02811 Dementia in other diseases classified elsewhere, unspecified severity, with agitation: Secondary | ICD-10-CM | POA: Diagnosis not present

## 2021-11-28 DIAGNOSIS — R296 Repeated falls: Secondary | ICD-10-CM | POA: Diagnosis not present

## 2021-11-28 DIAGNOSIS — E039 Hypothyroidism, unspecified: Secondary | ICD-10-CM | POA: Diagnosis not present

## 2021-11-28 DIAGNOSIS — F02818 Dementia in other diseases classified elsewhere, unspecified severity, with other behavioral disturbance: Secondary | ICD-10-CM | POA: Diagnosis not present

## 2021-11-28 DIAGNOSIS — I10 Essential (primary) hypertension: Secondary | ICD-10-CM | POA: Diagnosis not present

## 2021-11-28 DIAGNOSIS — K635 Polyp of colon: Secondary | ICD-10-CM | POA: Diagnosis not present

## 2021-11-28 DIAGNOSIS — Z8616 Personal history of COVID-19: Secondary | ICD-10-CM | POA: Diagnosis not present

## 2021-11-28 DIAGNOSIS — Z556 Problems related to health literacy: Secondary | ICD-10-CM | POA: Diagnosis not present

## 2021-11-30 DIAGNOSIS — R296 Repeated falls: Secondary | ICD-10-CM | POA: Diagnosis not present

## 2021-11-30 DIAGNOSIS — I2510 Atherosclerotic heart disease of native coronary artery without angina pectoris: Secondary | ICD-10-CM | POA: Diagnosis not present

## 2021-11-30 DIAGNOSIS — Z8616 Personal history of COVID-19: Secondary | ICD-10-CM | POA: Diagnosis not present

## 2021-11-30 DIAGNOSIS — I480 Paroxysmal atrial fibrillation: Secondary | ICD-10-CM | POA: Diagnosis not present

## 2021-11-30 DIAGNOSIS — F0282 Dementia in other diseases classified elsewhere, unspecified severity, with psychotic disturbance: Secondary | ICD-10-CM | POA: Diagnosis not present

## 2021-11-30 DIAGNOSIS — G20 Parkinson's disease: Secondary | ICD-10-CM | POA: Diagnosis not present

## 2021-11-30 DIAGNOSIS — F02818 Dementia in other diseases classified elsewhere, unspecified severity, with other behavioral disturbance: Secondary | ICD-10-CM | POA: Diagnosis not present

## 2021-11-30 DIAGNOSIS — Z7982 Long term (current) use of aspirin: Secondary | ICD-10-CM | POA: Diagnosis not present

## 2021-11-30 DIAGNOSIS — Z7902 Long term (current) use of antithrombotics/antiplatelets: Secondary | ICD-10-CM | POA: Diagnosis not present

## 2021-11-30 DIAGNOSIS — Z556 Problems related to health literacy: Secondary | ICD-10-CM | POA: Diagnosis not present

## 2021-11-30 DIAGNOSIS — Z7984 Long term (current) use of oral hypoglycemic drugs: Secondary | ICD-10-CM | POA: Diagnosis not present

## 2021-11-30 DIAGNOSIS — I10 Essential (primary) hypertension: Secondary | ICD-10-CM | POA: Diagnosis not present

## 2021-11-30 DIAGNOSIS — F02811 Dementia in other diseases classified elsewhere, unspecified severity, with agitation: Secondary | ICD-10-CM | POA: Diagnosis not present

## 2021-11-30 DIAGNOSIS — E119 Type 2 diabetes mellitus without complications: Secondary | ICD-10-CM | POA: Diagnosis not present

## 2021-11-30 DIAGNOSIS — I252 Old myocardial infarction: Secondary | ICD-10-CM | POA: Diagnosis not present

## 2021-11-30 DIAGNOSIS — E785 Hyperlipidemia, unspecified: Secondary | ICD-10-CM | POA: Diagnosis not present

## 2021-11-30 DIAGNOSIS — K635 Polyp of colon: Secondary | ICD-10-CM | POA: Diagnosis not present

## 2021-11-30 DIAGNOSIS — K219 Gastro-esophageal reflux disease without esophagitis: Secondary | ICD-10-CM | POA: Diagnosis not present

## 2021-11-30 DIAGNOSIS — Z9181 History of falling: Secondary | ICD-10-CM | POA: Diagnosis not present

## 2021-11-30 DIAGNOSIS — E039 Hypothyroidism, unspecified: Secondary | ICD-10-CM | POA: Diagnosis not present

## 2021-12-01 DIAGNOSIS — G20 Parkinson's disease: Secondary | ICD-10-CM | POA: Diagnosis not present

## 2021-12-01 DIAGNOSIS — E119 Type 2 diabetes mellitus without complications: Secondary | ICD-10-CM | POA: Diagnosis not present

## 2021-12-01 DIAGNOSIS — I480 Paroxysmal atrial fibrillation: Secondary | ICD-10-CM | POA: Diagnosis not present

## 2021-12-01 DIAGNOSIS — Z7902 Long term (current) use of antithrombotics/antiplatelets: Secondary | ICD-10-CM | POA: Diagnosis not present

## 2021-12-04 DIAGNOSIS — I480 Paroxysmal atrial fibrillation: Secondary | ICD-10-CM | POA: Diagnosis not present

## 2021-12-04 DIAGNOSIS — G20 Parkinson's disease: Secondary | ICD-10-CM | POA: Diagnosis not present

## 2021-12-04 DIAGNOSIS — I2109 ST elevation (STEMI) myocardial infarction involving other coronary artery of anterior wall: Secondary | ICD-10-CM | POA: Diagnosis not present

## 2021-12-06 DIAGNOSIS — G20 Parkinson's disease: Secondary | ICD-10-CM | POA: Diagnosis not present

## 2021-12-06 DIAGNOSIS — I2109 ST elevation (STEMI) myocardial infarction involving other coronary artery of anterior wall: Secondary | ICD-10-CM | POA: Diagnosis not present

## 2021-12-06 DIAGNOSIS — D509 Iron deficiency anemia, unspecified: Secondary | ICD-10-CM | POA: Diagnosis not present

## 2021-12-06 DIAGNOSIS — I4891 Unspecified atrial fibrillation: Secondary | ICD-10-CM | POA: Diagnosis not present

## 2021-12-06 DIAGNOSIS — Z79899 Other long term (current) drug therapy: Secondary | ICD-10-CM | POA: Diagnosis not present

## 2021-12-06 DIAGNOSIS — I480 Paroxysmal atrial fibrillation: Secondary | ICD-10-CM | POA: Diagnosis not present

## 2021-12-06 DIAGNOSIS — K219 Gastro-esophageal reflux disease without esophagitis: Secondary | ICD-10-CM | POA: Diagnosis not present

## 2021-12-07 DIAGNOSIS — R296 Repeated falls: Secondary | ICD-10-CM | POA: Diagnosis not present

## 2021-12-07 DIAGNOSIS — Z8616 Personal history of COVID-19: Secondary | ICD-10-CM | POA: Diagnosis not present

## 2021-12-07 DIAGNOSIS — I2510 Atherosclerotic heart disease of native coronary artery without angina pectoris: Secondary | ICD-10-CM | POA: Diagnosis not present

## 2021-12-07 DIAGNOSIS — F0282 Dementia in other diseases classified elsewhere, unspecified severity, with psychotic disturbance: Secondary | ICD-10-CM | POA: Diagnosis not present

## 2021-12-07 DIAGNOSIS — Z7984 Long term (current) use of oral hypoglycemic drugs: Secondary | ICD-10-CM | POA: Diagnosis not present

## 2021-12-07 DIAGNOSIS — G20 Parkinson's disease: Secondary | ICD-10-CM | POA: Diagnosis not present

## 2021-12-07 DIAGNOSIS — I480 Paroxysmal atrial fibrillation: Secondary | ICD-10-CM | POA: Diagnosis not present

## 2021-12-07 DIAGNOSIS — I10 Essential (primary) hypertension: Secondary | ICD-10-CM | POA: Diagnosis not present

## 2021-12-07 DIAGNOSIS — Z7982 Long term (current) use of aspirin: Secondary | ICD-10-CM | POA: Diagnosis not present

## 2021-12-07 DIAGNOSIS — E119 Type 2 diabetes mellitus without complications: Secondary | ICD-10-CM | POA: Diagnosis not present

## 2021-12-07 DIAGNOSIS — F02811 Dementia in other diseases classified elsewhere, unspecified severity, with agitation: Secondary | ICD-10-CM | POA: Diagnosis not present

## 2021-12-07 DIAGNOSIS — Z556 Problems related to health literacy: Secondary | ICD-10-CM | POA: Diagnosis not present

## 2021-12-07 DIAGNOSIS — K219 Gastro-esophageal reflux disease without esophagitis: Secondary | ICD-10-CM | POA: Diagnosis not present

## 2021-12-07 DIAGNOSIS — E785 Hyperlipidemia, unspecified: Secondary | ICD-10-CM | POA: Diagnosis not present

## 2021-12-07 DIAGNOSIS — I252 Old myocardial infarction: Secondary | ICD-10-CM | POA: Diagnosis not present

## 2021-12-07 DIAGNOSIS — K635 Polyp of colon: Secondary | ICD-10-CM | POA: Diagnosis not present

## 2021-12-07 DIAGNOSIS — Z9181 History of falling: Secondary | ICD-10-CM | POA: Diagnosis not present

## 2021-12-07 DIAGNOSIS — F02818 Dementia in other diseases classified elsewhere, unspecified severity, with other behavioral disturbance: Secondary | ICD-10-CM | POA: Diagnosis not present

## 2021-12-07 DIAGNOSIS — E039 Hypothyroidism, unspecified: Secondary | ICD-10-CM | POA: Diagnosis not present

## 2021-12-07 DIAGNOSIS — Z7902 Long term (current) use of antithrombotics/antiplatelets: Secondary | ICD-10-CM | POA: Diagnosis not present

## 2021-12-13 DIAGNOSIS — I2510 Atherosclerotic heart disease of native coronary artery without angina pectoris: Secondary | ICD-10-CM | POA: Diagnosis not present

## 2021-12-13 DIAGNOSIS — I252 Old myocardial infarction: Secondary | ICD-10-CM | POA: Diagnosis not present

## 2021-12-13 DIAGNOSIS — E785 Hyperlipidemia, unspecified: Secondary | ICD-10-CM | POA: Diagnosis not present

## 2021-12-13 DIAGNOSIS — K219 Gastro-esophageal reflux disease without esophagitis: Secondary | ICD-10-CM | POA: Diagnosis not present

## 2021-12-13 DIAGNOSIS — K635 Polyp of colon: Secondary | ICD-10-CM | POA: Diagnosis not present

## 2021-12-13 DIAGNOSIS — I10 Essential (primary) hypertension: Secondary | ICD-10-CM | POA: Diagnosis not present

## 2021-12-13 DIAGNOSIS — E039 Hypothyroidism, unspecified: Secondary | ICD-10-CM | POA: Diagnosis not present

## 2021-12-13 DIAGNOSIS — R296 Repeated falls: Secondary | ICD-10-CM | POA: Diagnosis not present

## 2021-12-13 DIAGNOSIS — Z8616 Personal history of COVID-19: Secondary | ICD-10-CM | POA: Diagnosis not present

## 2021-12-13 DIAGNOSIS — F02818 Dementia in other diseases classified elsewhere, unspecified severity, with other behavioral disturbance: Secondary | ICD-10-CM | POA: Diagnosis not present

## 2021-12-13 DIAGNOSIS — Z7902 Long term (current) use of antithrombotics/antiplatelets: Secondary | ICD-10-CM | POA: Diagnosis not present

## 2021-12-13 DIAGNOSIS — I480 Paroxysmal atrial fibrillation: Secondary | ICD-10-CM | POA: Diagnosis not present

## 2021-12-13 DIAGNOSIS — Z9181 History of falling: Secondary | ICD-10-CM | POA: Diagnosis not present

## 2021-12-13 DIAGNOSIS — Z556 Problems related to health literacy: Secondary | ICD-10-CM | POA: Diagnosis not present

## 2021-12-13 DIAGNOSIS — E119 Type 2 diabetes mellitus without complications: Secondary | ICD-10-CM | POA: Diagnosis not present

## 2021-12-13 DIAGNOSIS — Z7982 Long term (current) use of aspirin: Secondary | ICD-10-CM | POA: Diagnosis not present

## 2021-12-13 DIAGNOSIS — Z7984 Long term (current) use of oral hypoglycemic drugs: Secondary | ICD-10-CM | POA: Diagnosis not present

## 2021-12-13 DIAGNOSIS — G20 Parkinson's disease: Secondary | ICD-10-CM | POA: Diagnosis not present

## 2021-12-13 DIAGNOSIS — F0282 Dementia in other diseases classified elsewhere, unspecified severity, with psychotic disturbance: Secondary | ICD-10-CM | POA: Diagnosis not present

## 2021-12-13 DIAGNOSIS — F02811 Dementia in other diseases classified elsewhere, unspecified severity, with agitation: Secondary | ICD-10-CM | POA: Diagnosis not present

## 2021-12-22 DIAGNOSIS — Z7982 Long term (current) use of aspirin: Secondary | ICD-10-CM | POA: Diagnosis not present

## 2021-12-22 DIAGNOSIS — K635 Polyp of colon: Secondary | ICD-10-CM | POA: Diagnosis not present

## 2021-12-22 DIAGNOSIS — Z556 Problems related to health literacy: Secondary | ICD-10-CM | POA: Diagnosis not present

## 2021-12-22 DIAGNOSIS — I252 Old myocardial infarction: Secondary | ICD-10-CM | POA: Diagnosis not present

## 2021-12-22 DIAGNOSIS — Z9181 History of falling: Secondary | ICD-10-CM | POA: Diagnosis not present

## 2021-12-22 DIAGNOSIS — E039 Hypothyroidism, unspecified: Secondary | ICD-10-CM | POA: Diagnosis not present

## 2021-12-22 DIAGNOSIS — K219 Gastro-esophageal reflux disease without esophagitis: Secondary | ICD-10-CM | POA: Diagnosis not present

## 2021-12-22 DIAGNOSIS — Z7902 Long term (current) use of antithrombotics/antiplatelets: Secondary | ICD-10-CM | POA: Diagnosis not present

## 2021-12-22 DIAGNOSIS — Z7984 Long term (current) use of oral hypoglycemic drugs: Secondary | ICD-10-CM | POA: Diagnosis not present

## 2021-12-22 DIAGNOSIS — R296 Repeated falls: Secondary | ICD-10-CM | POA: Diagnosis not present

## 2021-12-22 DIAGNOSIS — F02818 Dementia in other diseases classified elsewhere, unspecified severity, with other behavioral disturbance: Secondary | ICD-10-CM | POA: Diagnosis not present

## 2021-12-22 DIAGNOSIS — F0282 Dementia in other diseases classified elsewhere, unspecified severity, with psychotic disturbance: Secondary | ICD-10-CM | POA: Diagnosis not present

## 2021-12-22 DIAGNOSIS — E119 Type 2 diabetes mellitus without complications: Secondary | ICD-10-CM | POA: Diagnosis not present

## 2021-12-22 DIAGNOSIS — F02811 Dementia in other diseases classified elsewhere, unspecified severity, with agitation: Secondary | ICD-10-CM | POA: Diagnosis not present

## 2021-12-22 DIAGNOSIS — E785 Hyperlipidemia, unspecified: Secondary | ICD-10-CM | POA: Diagnosis not present

## 2021-12-22 DIAGNOSIS — G20 Parkinson's disease: Secondary | ICD-10-CM | POA: Diagnosis not present

## 2021-12-22 DIAGNOSIS — I10 Essential (primary) hypertension: Secondary | ICD-10-CM | POA: Diagnosis not present

## 2021-12-22 DIAGNOSIS — I2510 Atherosclerotic heart disease of native coronary artery without angina pectoris: Secondary | ICD-10-CM | POA: Diagnosis not present

## 2021-12-22 DIAGNOSIS — Z8616 Personal history of COVID-19: Secondary | ICD-10-CM | POA: Diagnosis not present

## 2021-12-22 DIAGNOSIS — I480 Paroxysmal atrial fibrillation: Secondary | ICD-10-CM | POA: Diagnosis not present

## 2021-12-25 DIAGNOSIS — F0282 Dementia in other diseases classified elsewhere, unspecified severity, with psychotic disturbance: Secondary | ICD-10-CM | POA: Diagnosis not present

## 2021-12-25 DIAGNOSIS — G20 Parkinson's disease: Secondary | ICD-10-CM | POA: Diagnosis not present

## 2021-12-25 DIAGNOSIS — Z556 Problems related to health literacy: Secondary | ICD-10-CM | POA: Diagnosis not present

## 2021-12-25 DIAGNOSIS — Z9181 History of falling: Secondary | ICD-10-CM | POA: Diagnosis not present

## 2021-12-25 DIAGNOSIS — Z7982 Long term (current) use of aspirin: Secondary | ICD-10-CM | POA: Diagnosis not present

## 2021-12-25 DIAGNOSIS — I2510 Atherosclerotic heart disease of native coronary artery without angina pectoris: Secondary | ICD-10-CM | POA: Diagnosis not present

## 2021-12-25 DIAGNOSIS — K219 Gastro-esophageal reflux disease without esophagitis: Secondary | ICD-10-CM | POA: Diagnosis not present

## 2021-12-25 DIAGNOSIS — I252 Old myocardial infarction: Secondary | ICD-10-CM | POA: Diagnosis not present

## 2021-12-25 DIAGNOSIS — K635 Polyp of colon: Secondary | ICD-10-CM | POA: Diagnosis not present

## 2021-12-25 DIAGNOSIS — R296 Repeated falls: Secondary | ICD-10-CM | POA: Diagnosis not present

## 2021-12-25 DIAGNOSIS — E039 Hypothyroidism, unspecified: Secondary | ICD-10-CM | POA: Diagnosis not present

## 2021-12-25 DIAGNOSIS — F02818 Dementia in other diseases classified elsewhere, unspecified severity, with other behavioral disturbance: Secondary | ICD-10-CM | POA: Diagnosis not present

## 2021-12-25 DIAGNOSIS — I480 Paroxysmal atrial fibrillation: Secondary | ICD-10-CM | POA: Diagnosis not present

## 2021-12-25 DIAGNOSIS — I10 Essential (primary) hypertension: Secondary | ICD-10-CM | POA: Diagnosis not present

## 2021-12-25 DIAGNOSIS — Z8616 Personal history of COVID-19: Secondary | ICD-10-CM | POA: Diagnosis not present

## 2021-12-25 DIAGNOSIS — Z7984 Long term (current) use of oral hypoglycemic drugs: Secondary | ICD-10-CM | POA: Diagnosis not present

## 2021-12-25 DIAGNOSIS — F02811 Dementia in other diseases classified elsewhere, unspecified severity, with agitation: Secondary | ICD-10-CM | POA: Diagnosis not present

## 2021-12-25 DIAGNOSIS — E119 Type 2 diabetes mellitus without complications: Secondary | ICD-10-CM | POA: Diagnosis not present

## 2021-12-25 DIAGNOSIS — E785 Hyperlipidemia, unspecified: Secondary | ICD-10-CM | POA: Diagnosis not present

## 2021-12-25 DIAGNOSIS — Z7902 Long term (current) use of antithrombotics/antiplatelets: Secondary | ICD-10-CM | POA: Diagnosis not present

## 2021-12-26 ENCOUNTER — Non-Acute Institutional Stay: Payer: Medicare Other | Admitting: Hospice

## 2021-12-26 DIAGNOSIS — G20 Parkinson's disease: Secondary | ICD-10-CM | POA: Diagnosis not present

## 2021-12-26 DIAGNOSIS — Z515 Encounter for palliative care: Secondary | ICD-10-CM

## 2021-12-26 DIAGNOSIS — F0280 Dementia in other diseases classified elsewhere without behavioral disturbance: Secondary | ICD-10-CM

## 2021-12-26 DIAGNOSIS — E119 Type 2 diabetes mellitus without complications: Secondary | ICD-10-CM | POA: Diagnosis not present
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
## 2021-12-27 DIAGNOSIS — F02811 Dementia in other diseases classified elsewhere, unspecified severity, with agitation: Secondary | ICD-10-CM | POA: Diagnosis not present

## 2021-12-27 DIAGNOSIS — Z7902 Long term (current) use of antithrombotics/antiplatelets: Secondary | ICD-10-CM | POA: Diagnosis not present

## 2021-12-27 DIAGNOSIS — K219 Gastro-esophageal reflux disease without esophagitis: Secondary | ICD-10-CM | POA: Diagnosis not present

## 2021-12-27 DIAGNOSIS — E119 Type 2 diabetes mellitus without complications: Secondary | ICD-10-CM | POA: Diagnosis not present

## 2021-12-27 DIAGNOSIS — E785 Hyperlipidemia, unspecified: Secondary | ICD-10-CM | POA: Diagnosis not present

## 2021-12-27 DIAGNOSIS — Z9181 History of falling: Secondary | ICD-10-CM | POA: Diagnosis not present

## 2021-12-27 DIAGNOSIS — I10 Essential (primary) hypertension: Secondary | ICD-10-CM | POA: Diagnosis not present

## 2021-12-27 DIAGNOSIS — R296 Repeated falls: Secondary | ICD-10-CM | POA: Diagnosis not present

## 2021-12-27 DIAGNOSIS — F0282 Dementia in other diseases classified elsewhere, unspecified severity, with psychotic disturbance: Secondary | ICD-10-CM | POA: Diagnosis not present

## 2021-12-27 DIAGNOSIS — Z556 Problems related to health literacy: Secondary | ICD-10-CM | POA: Diagnosis not present

## 2021-12-27 DIAGNOSIS — I480 Paroxysmal atrial fibrillation: Secondary | ICD-10-CM | POA: Diagnosis not present

## 2021-12-27 DIAGNOSIS — Z8616 Personal history of COVID-19: Secondary | ICD-10-CM | POA: Diagnosis not present

## 2021-12-27 DIAGNOSIS — I2510 Atherosclerotic heart disease of native coronary artery without angina pectoris: Secondary | ICD-10-CM | POA: Diagnosis not present

## 2021-12-27 DIAGNOSIS — Z7982 Long term (current) use of aspirin: Secondary | ICD-10-CM | POA: Diagnosis not present

## 2021-12-27 DIAGNOSIS — Z7984 Long term (current) use of oral hypoglycemic drugs: Secondary | ICD-10-CM | POA: Diagnosis not present

## 2021-12-27 DIAGNOSIS — I252 Old myocardial infarction: Secondary | ICD-10-CM | POA: Diagnosis not present

## 2021-12-27 DIAGNOSIS — F02818 Dementia in other diseases classified elsewhere, unspecified severity, with other behavioral disturbance: Secondary | ICD-10-CM | POA: Diagnosis not present

## 2021-12-27 DIAGNOSIS — K635 Polyp of colon: Secondary | ICD-10-CM | POA: Diagnosis not present

## 2021-12-27 DIAGNOSIS — G20 Parkinson's disease: Secondary | ICD-10-CM | POA: Diagnosis not present

## 2021-12-27 DIAGNOSIS — E039 Hypothyroidism, unspecified: Secondary | ICD-10-CM | POA: Diagnosis not present

## 2022-01-03 DIAGNOSIS — F02818 Dementia in other diseases classified elsewhere, unspecified severity, with other behavioral disturbance: Secondary | ICD-10-CM | POA: Diagnosis not present

## 2022-01-03 DIAGNOSIS — I2109 ST elevation (STEMI) myocardial infarction involving other coronary artery of anterior wall: Secondary | ICD-10-CM | POA: Diagnosis not present

## 2022-01-03 DIAGNOSIS — Z7902 Long term (current) use of antithrombotics/antiplatelets: Secondary | ICD-10-CM | POA: Diagnosis not present

## 2022-01-03 DIAGNOSIS — E119 Type 2 diabetes mellitus without complications: Secondary | ICD-10-CM | POA: Diagnosis not present

## 2022-01-03 DIAGNOSIS — I4891 Unspecified atrial fibrillation: Secondary | ICD-10-CM | POA: Diagnosis not present

## 2022-01-03 DIAGNOSIS — K219 Gastro-esophageal reflux disease without esophagitis: Secondary | ICD-10-CM | POA: Diagnosis not present

## 2022-01-03 DIAGNOSIS — E785 Hyperlipidemia, unspecified: Secondary | ICD-10-CM | POA: Diagnosis not present

## 2022-01-03 DIAGNOSIS — E039 Hypothyroidism, unspecified: Secondary | ICD-10-CM | POA: Diagnosis not present

## 2022-01-03 DIAGNOSIS — F0282 Dementia in other diseases classified elsewhere, unspecified severity, with psychotic disturbance: Secondary | ICD-10-CM | POA: Diagnosis not present

## 2022-01-03 DIAGNOSIS — Z7984 Long term (current) use of oral hypoglycemic drugs: Secondary | ICD-10-CM | POA: Diagnosis not present

## 2022-01-03 DIAGNOSIS — I10 Essential (primary) hypertension: Secondary | ICD-10-CM | POA: Diagnosis not present

## 2022-01-03 DIAGNOSIS — Z8669 Personal history of other diseases of the nervous system and sense organs: Secondary | ICD-10-CM | POA: Diagnosis not present

## 2022-01-03 DIAGNOSIS — Z9181 History of falling: Secondary | ICD-10-CM | POA: Diagnosis not present

## 2022-01-03 DIAGNOSIS — G20 Parkinson's disease: Secondary | ICD-10-CM | POA: Diagnosis not present

## 2022-01-03 DIAGNOSIS — R296 Repeated falls: Secondary | ICD-10-CM | POA: Diagnosis not present

## 2022-01-03 DIAGNOSIS — I252 Old myocardial infarction: Secondary | ICD-10-CM | POA: Diagnosis not present

## 2022-01-03 DIAGNOSIS — Z7982 Long term (current) use of aspirin: Secondary | ICD-10-CM | POA: Diagnosis not present

## 2022-01-03 DIAGNOSIS — I480 Paroxysmal atrial fibrillation: Secondary | ICD-10-CM | POA: Diagnosis not present

## 2022-01-03 DIAGNOSIS — K635 Polyp of colon: Secondary | ICD-10-CM | POA: Diagnosis not present

## 2022-01-03 DIAGNOSIS — Z8616 Personal history of COVID-19: Secondary | ICD-10-CM | POA: Diagnosis not present

## 2022-01-03 DIAGNOSIS — Z556 Problems related to health literacy: Secondary | ICD-10-CM | POA: Diagnosis not present

## 2022-01-03 DIAGNOSIS — F02811 Dementia in other diseases classified elsewhere, unspecified severity, with agitation: Secondary | ICD-10-CM | POA: Diagnosis not present

## 2022-01-03 DIAGNOSIS — I2510 Atherosclerotic heart disease of native coronary artery without angina pectoris: Secondary | ICD-10-CM | POA: Diagnosis not present

## 2022-01-05 DIAGNOSIS — I480 Paroxysmal atrial fibrillation: Secondary | ICD-10-CM | POA: Diagnosis not present

## 2022-01-05 DIAGNOSIS — G20 Parkinson's disease: Secondary | ICD-10-CM | POA: Diagnosis not present

## 2022-01-05 DIAGNOSIS — I2109 ST elevation (STEMI) myocardial infarction involving other coronary artery of anterior wall: Secondary | ICD-10-CM | POA: Diagnosis not present

## 2022-01-10 DIAGNOSIS — K219 Gastro-esophageal reflux disease without esophagitis: Secondary | ICD-10-CM | POA: Diagnosis not present

## 2022-01-10 DIAGNOSIS — W010XXA Fall on same level from slipping, tripping and stumbling without subsequent striking against object, initial encounter: Secondary | ICD-10-CM | POA: Diagnosis not present

## 2022-01-10 DIAGNOSIS — I4891 Unspecified atrial fibrillation: Secondary | ICD-10-CM | POA: Diagnosis not present

## 2022-01-12 DIAGNOSIS — Z556 Problems related to health literacy: Secondary | ICD-10-CM | POA: Diagnosis not present

## 2022-01-12 DIAGNOSIS — Z9181 History of falling: Secondary | ICD-10-CM | POA: Diagnosis not present

## 2022-01-12 DIAGNOSIS — F02811 Dementia in other diseases classified elsewhere, unspecified severity, with agitation: Secondary | ICD-10-CM | POA: Diagnosis not present

## 2022-01-12 DIAGNOSIS — E039 Hypothyroidism, unspecified: Secondary | ICD-10-CM | POA: Diagnosis not present

## 2022-01-12 DIAGNOSIS — E785 Hyperlipidemia, unspecified: Secondary | ICD-10-CM | POA: Diagnosis not present

## 2022-01-12 DIAGNOSIS — K219 Gastro-esophageal reflux disease without esophagitis: Secondary | ICD-10-CM | POA: Diagnosis not present

## 2022-01-12 DIAGNOSIS — F0282 Dementia in other diseases classified elsewhere, unspecified severity, with psychotic disturbance: Secondary | ICD-10-CM | POA: Diagnosis not present

## 2022-01-12 DIAGNOSIS — G20 Parkinson's disease: Secondary | ICD-10-CM | POA: Diagnosis not present

## 2022-01-12 DIAGNOSIS — I2510 Atherosclerotic heart disease of native coronary artery without angina pectoris: Secondary | ICD-10-CM | POA: Diagnosis not present

## 2022-01-12 DIAGNOSIS — Z7982 Long term (current) use of aspirin: Secondary | ICD-10-CM | POA: Diagnosis not present

## 2022-01-12 DIAGNOSIS — K635 Polyp of colon: Secondary | ICD-10-CM | POA: Diagnosis not present

## 2022-01-12 DIAGNOSIS — Z7902 Long term (current) use of antithrombotics/antiplatelets: Secondary | ICD-10-CM | POA: Diagnosis not present

## 2022-01-12 DIAGNOSIS — F02818 Dementia in other diseases classified elsewhere, unspecified severity, with other behavioral disturbance: Secondary | ICD-10-CM | POA: Diagnosis not present

## 2022-01-12 DIAGNOSIS — Z7984 Long term (current) use of oral hypoglycemic drugs: Secondary | ICD-10-CM | POA: Diagnosis not present

## 2022-01-12 DIAGNOSIS — Z8616 Personal history of COVID-19: Secondary | ICD-10-CM | POA: Diagnosis not present

## 2022-01-12 DIAGNOSIS — E119 Type 2 diabetes mellitus without complications: Secondary | ICD-10-CM | POA: Diagnosis not present

## 2022-01-12 DIAGNOSIS — R296 Repeated falls: Secondary | ICD-10-CM | POA: Diagnosis not present

## 2022-01-12 DIAGNOSIS — I252 Old myocardial infarction: Secondary | ICD-10-CM | POA: Diagnosis not present

## 2022-01-12 DIAGNOSIS — I10 Essential (primary) hypertension: Secondary | ICD-10-CM | POA: Diagnosis not present

## 2022-01-12 DIAGNOSIS — I480 Paroxysmal atrial fibrillation: Secondary | ICD-10-CM | POA: Diagnosis not present

## 2022-01-16 DIAGNOSIS — F0282 Dementia in other diseases classified elsewhere, unspecified severity, with psychotic disturbance: Secondary | ICD-10-CM | POA: Diagnosis not present

## 2022-01-16 DIAGNOSIS — E785 Hyperlipidemia, unspecified: Secondary | ICD-10-CM | POA: Diagnosis not present

## 2022-01-16 DIAGNOSIS — K219 Gastro-esophageal reflux disease without esophagitis: Secondary | ICD-10-CM | POA: Diagnosis not present

## 2022-01-16 DIAGNOSIS — K635 Polyp of colon: Secondary | ICD-10-CM | POA: Diagnosis not present

## 2022-01-16 DIAGNOSIS — F02811 Dementia in other diseases classified elsewhere, unspecified severity, with agitation: Secondary | ICD-10-CM | POA: Diagnosis not present

## 2022-01-16 DIAGNOSIS — Z7982 Long term (current) use of aspirin: Secondary | ICD-10-CM | POA: Diagnosis not present

## 2022-01-16 DIAGNOSIS — Z7984 Long term (current) use of oral hypoglycemic drugs: Secondary | ICD-10-CM | POA: Diagnosis not present

## 2022-01-16 DIAGNOSIS — I252 Old myocardial infarction: Secondary | ICD-10-CM | POA: Diagnosis not present

## 2022-01-16 DIAGNOSIS — Z556 Problems related to health literacy: Secondary | ICD-10-CM | POA: Diagnosis not present

## 2022-01-16 DIAGNOSIS — Z8616 Personal history of COVID-19: Secondary | ICD-10-CM | POA: Diagnosis not present

## 2022-01-16 DIAGNOSIS — I2510 Atherosclerotic heart disease of native coronary artery without angina pectoris: Secondary | ICD-10-CM | POA: Diagnosis not present

## 2022-01-16 DIAGNOSIS — G20 Parkinson's disease: Secondary | ICD-10-CM | POA: Diagnosis not present

## 2022-01-16 DIAGNOSIS — I480 Paroxysmal atrial fibrillation: Secondary | ICD-10-CM | POA: Diagnosis not present

## 2022-01-16 DIAGNOSIS — E119 Type 2 diabetes mellitus without complications: Secondary | ICD-10-CM | POA: Diagnosis not present

## 2022-01-16 DIAGNOSIS — Z7902 Long term (current) use of antithrombotics/antiplatelets: Secondary | ICD-10-CM | POA: Diagnosis not present

## 2022-01-16 DIAGNOSIS — Z9181 History of falling: Secondary | ICD-10-CM | POA: Diagnosis not present

## 2022-01-16 DIAGNOSIS — F02818 Dementia in other diseases classified elsewhere, unspecified severity, with other behavioral disturbance: Secondary | ICD-10-CM | POA: Diagnosis not present

## 2022-01-16 DIAGNOSIS — R296 Repeated falls: Secondary | ICD-10-CM | POA: Diagnosis not present

## 2022-01-16 DIAGNOSIS — E039 Hypothyroidism, unspecified: Secondary | ICD-10-CM | POA: Diagnosis not present

## 2022-01-16 DIAGNOSIS — I10 Essential (primary) hypertension: Secondary | ICD-10-CM | POA: Diagnosis not present

## 2022-01-18 DIAGNOSIS — Z7902 Long term (current) use of antithrombotics/antiplatelets: Secondary | ICD-10-CM | POA: Diagnosis not present

## 2022-01-18 DIAGNOSIS — E785 Hyperlipidemia, unspecified: Secondary | ICD-10-CM | POA: Diagnosis not present

## 2022-01-18 DIAGNOSIS — G20 Parkinson's disease: Secondary | ICD-10-CM | POA: Diagnosis not present

## 2022-01-18 DIAGNOSIS — I2510 Atherosclerotic heart disease of native coronary artery without angina pectoris: Secondary | ICD-10-CM | POA: Diagnosis not present

## 2022-01-18 DIAGNOSIS — Z556 Problems related to health literacy: Secondary | ICD-10-CM | POA: Diagnosis not present

## 2022-01-18 DIAGNOSIS — E119 Type 2 diabetes mellitus without complications: Secondary | ICD-10-CM | POA: Diagnosis not present

## 2022-01-18 DIAGNOSIS — I10 Essential (primary) hypertension: Secondary | ICD-10-CM | POA: Diagnosis not present

## 2022-01-18 DIAGNOSIS — E039 Hypothyroidism, unspecified: Secondary | ICD-10-CM | POA: Diagnosis not present

## 2022-01-18 DIAGNOSIS — Z9181 History of falling: Secondary | ICD-10-CM | POA: Diagnosis not present

## 2022-01-18 DIAGNOSIS — K219 Gastro-esophageal reflux disease without esophagitis: Secondary | ICD-10-CM | POA: Diagnosis not present

## 2022-01-18 DIAGNOSIS — I480 Paroxysmal atrial fibrillation: Secondary | ICD-10-CM | POA: Diagnosis not present

## 2022-01-18 DIAGNOSIS — Z7984 Long term (current) use of oral hypoglycemic drugs: Secondary | ICD-10-CM | POA: Diagnosis not present

## 2022-01-18 DIAGNOSIS — K635 Polyp of colon: Secondary | ICD-10-CM | POA: Diagnosis not present

## 2022-01-18 DIAGNOSIS — Z7982 Long term (current) use of aspirin: Secondary | ICD-10-CM | POA: Diagnosis not present

## 2022-01-18 DIAGNOSIS — F02818 Dementia in other diseases classified elsewhere, unspecified severity, with other behavioral disturbance: Secondary | ICD-10-CM | POA: Diagnosis not present

## 2022-01-18 DIAGNOSIS — F02811 Dementia in other diseases classified elsewhere, unspecified severity, with agitation: Secondary | ICD-10-CM | POA: Diagnosis not present

## 2022-01-18 DIAGNOSIS — I252 Old myocardial infarction: Secondary | ICD-10-CM | POA: Diagnosis not present

## 2022-01-18 DIAGNOSIS — F0282 Dementia in other diseases classified elsewhere, unspecified severity, with psychotic disturbance: Secondary | ICD-10-CM | POA: Diagnosis not present

## 2022-01-18 DIAGNOSIS — R296 Repeated falls: Secondary | ICD-10-CM | POA: Diagnosis not present

## 2022-01-18 DIAGNOSIS — Z8616 Personal history of COVID-19: Secondary | ICD-10-CM | POA: Diagnosis not present

## 2022-01-23 DIAGNOSIS — I480 Paroxysmal atrial fibrillation: Secondary | ICD-10-CM | POA: Diagnosis not present

## 2022-01-23 DIAGNOSIS — Z7982 Long term (current) use of aspirin: Secondary | ICD-10-CM | POA: Diagnosis not present

## 2022-01-23 DIAGNOSIS — G20 Parkinson's disease: Secondary | ICD-10-CM | POA: Diagnosis not present

## 2022-01-23 DIAGNOSIS — I2510 Atherosclerotic heart disease of native coronary artery without angina pectoris: Secondary | ICD-10-CM | POA: Diagnosis not present

## 2022-01-23 DIAGNOSIS — R296 Repeated falls: Secondary | ICD-10-CM | POA: Diagnosis not present

## 2022-01-23 DIAGNOSIS — F0282 Dementia in other diseases classified elsewhere, unspecified severity, with psychotic disturbance: Secondary | ICD-10-CM | POA: Diagnosis not present

## 2022-01-23 DIAGNOSIS — F02818 Dementia in other diseases classified elsewhere, unspecified severity, with other behavioral disturbance: Secondary | ICD-10-CM | POA: Diagnosis not present

## 2022-01-23 DIAGNOSIS — Z8616 Personal history of COVID-19: Secondary | ICD-10-CM | POA: Diagnosis not present

## 2022-01-23 DIAGNOSIS — K635 Polyp of colon: Secondary | ICD-10-CM | POA: Diagnosis not present

## 2022-01-23 DIAGNOSIS — E119 Type 2 diabetes mellitus without complications: Secondary | ICD-10-CM | POA: Diagnosis not present

## 2022-01-23 DIAGNOSIS — I10 Essential (primary) hypertension: Secondary | ICD-10-CM | POA: Diagnosis not present

## 2022-01-23 DIAGNOSIS — Z556 Problems related to health literacy: Secondary | ICD-10-CM | POA: Diagnosis not present

## 2022-01-23 DIAGNOSIS — Z7902 Long term (current) use of antithrombotics/antiplatelets: Secondary | ICD-10-CM | POA: Diagnosis not present

## 2022-01-23 DIAGNOSIS — E785 Hyperlipidemia, unspecified: Secondary | ICD-10-CM | POA: Diagnosis not present

## 2022-01-23 DIAGNOSIS — Z7984 Long term (current) use of oral hypoglycemic drugs: Secondary | ICD-10-CM | POA: Diagnosis not present

## 2022-01-23 DIAGNOSIS — E039 Hypothyroidism, unspecified: Secondary | ICD-10-CM | POA: Diagnosis not present

## 2022-01-23 DIAGNOSIS — Z9181 History of falling: Secondary | ICD-10-CM | POA: Diagnosis not present

## 2022-01-23 DIAGNOSIS — I252 Old myocardial infarction: Secondary | ICD-10-CM | POA: Diagnosis not present

## 2022-01-23 DIAGNOSIS — K219 Gastro-esophageal reflux disease without esophagitis: Secondary | ICD-10-CM | POA: Diagnosis not present

## 2022-01-23 DIAGNOSIS — F02811 Dementia in other diseases classified elsewhere, unspecified severity, with agitation: Secondary | ICD-10-CM | POA: Diagnosis not present

## 2022-01-26 DIAGNOSIS — E785 Hyperlipidemia, unspecified: Secondary | ICD-10-CM | POA: Diagnosis not present

## 2022-01-26 DIAGNOSIS — K219 Gastro-esophageal reflux disease without esophagitis: Secondary | ICD-10-CM | POA: Diagnosis not present

## 2022-01-26 DIAGNOSIS — F0282 Dementia in other diseases classified elsewhere, unspecified severity, with psychotic disturbance: Secondary | ICD-10-CM | POA: Diagnosis not present

## 2022-01-26 DIAGNOSIS — E039 Hypothyroidism, unspecified: Secondary | ICD-10-CM | POA: Diagnosis not present

## 2022-01-26 DIAGNOSIS — Z7982 Long term (current) use of aspirin: Secondary | ICD-10-CM | POA: Diagnosis not present

## 2022-01-26 DIAGNOSIS — I2510 Atherosclerotic heart disease of native coronary artery without angina pectoris: Secondary | ICD-10-CM | POA: Diagnosis not present

## 2022-01-26 DIAGNOSIS — K635 Polyp of colon: Secondary | ICD-10-CM | POA: Diagnosis not present

## 2022-01-26 DIAGNOSIS — I10 Essential (primary) hypertension: Secondary | ICD-10-CM | POA: Diagnosis not present

## 2022-01-26 DIAGNOSIS — E119 Type 2 diabetes mellitus without complications: Secondary | ICD-10-CM | POA: Diagnosis not present

## 2022-01-26 DIAGNOSIS — F02818 Dementia in other diseases classified elsewhere, unspecified severity, with other behavioral disturbance: Secondary | ICD-10-CM | POA: Diagnosis not present

## 2022-01-26 DIAGNOSIS — Z7902 Long term (current) use of antithrombotics/antiplatelets: Secondary | ICD-10-CM | POA: Diagnosis not present

## 2022-01-26 DIAGNOSIS — F02811 Dementia in other diseases classified elsewhere, unspecified severity, with agitation: Secondary | ICD-10-CM | POA: Diagnosis not present

## 2022-01-26 DIAGNOSIS — Z9181 History of falling: Secondary | ICD-10-CM | POA: Diagnosis not present

## 2022-01-26 DIAGNOSIS — Z556 Problems related to health literacy: Secondary | ICD-10-CM | POA: Diagnosis not present

## 2022-01-26 DIAGNOSIS — Z7984 Long term (current) use of oral hypoglycemic drugs: Secondary | ICD-10-CM | POA: Diagnosis not present

## 2022-01-26 DIAGNOSIS — Z8616 Personal history of COVID-19: Secondary | ICD-10-CM | POA: Diagnosis not present

## 2022-01-26 DIAGNOSIS — I480 Paroxysmal atrial fibrillation: Secondary | ICD-10-CM | POA: Diagnosis not present

## 2022-01-26 DIAGNOSIS — G20 Parkinson's disease: Secondary | ICD-10-CM | POA: Diagnosis not present

## 2022-01-26 DIAGNOSIS — R296 Repeated falls: Secondary | ICD-10-CM | POA: Diagnosis not present

## 2022-01-26 DIAGNOSIS — I252 Old myocardial infarction: Secondary | ICD-10-CM | POA: Diagnosis not present

## 2022-01-29 DIAGNOSIS — I2510 Atherosclerotic heart disease of native coronary artery without angina pectoris: Secondary | ICD-10-CM | POA: Diagnosis not present

## 2022-01-29 DIAGNOSIS — E119 Type 2 diabetes mellitus without complications: Secondary | ICD-10-CM | POA: Diagnosis not present

## 2022-01-29 DIAGNOSIS — G20 Parkinson's disease: Secondary | ICD-10-CM | POA: Diagnosis not present

## 2022-01-29 DIAGNOSIS — R296 Repeated falls: Secondary | ICD-10-CM | POA: Diagnosis not present

## 2022-01-29 DIAGNOSIS — Z556 Problems related to health literacy: Secondary | ICD-10-CM | POA: Diagnosis not present

## 2022-01-29 DIAGNOSIS — F02811 Dementia in other diseases classified elsewhere, unspecified severity, with agitation: Secondary | ICD-10-CM | POA: Diagnosis not present

## 2022-01-29 DIAGNOSIS — E039 Hypothyroidism, unspecified: Secondary | ICD-10-CM | POA: Diagnosis not present

## 2022-01-29 DIAGNOSIS — Z7984 Long term (current) use of oral hypoglycemic drugs: Secondary | ICD-10-CM | POA: Diagnosis not present

## 2022-01-29 DIAGNOSIS — Z8616 Personal history of COVID-19: Secondary | ICD-10-CM | POA: Diagnosis not present

## 2022-01-29 DIAGNOSIS — K635 Polyp of colon: Secondary | ICD-10-CM | POA: Diagnosis not present

## 2022-01-29 DIAGNOSIS — Z9181 History of falling: Secondary | ICD-10-CM | POA: Diagnosis not present

## 2022-01-29 DIAGNOSIS — I252 Old myocardial infarction: Secondary | ICD-10-CM | POA: Diagnosis not present

## 2022-01-29 DIAGNOSIS — F02818 Dementia in other diseases classified elsewhere, unspecified severity, with other behavioral disturbance: Secondary | ICD-10-CM | POA: Diagnosis not present

## 2022-01-29 DIAGNOSIS — E785 Hyperlipidemia, unspecified: Secondary | ICD-10-CM | POA: Diagnosis not present

## 2022-01-29 DIAGNOSIS — I10 Essential (primary) hypertension: Secondary | ICD-10-CM | POA: Diagnosis not present

## 2022-01-29 DIAGNOSIS — I480 Paroxysmal atrial fibrillation: Secondary | ICD-10-CM | POA: Diagnosis not present

## 2022-01-29 DIAGNOSIS — Z7982 Long term (current) use of aspirin: Secondary | ICD-10-CM | POA: Diagnosis not present

## 2022-01-29 DIAGNOSIS — F0282 Dementia in other diseases classified elsewhere, unspecified severity, with psychotic disturbance: Secondary | ICD-10-CM | POA: Diagnosis not present

## 2022-01-29 DIAGNOSIS — K219 Gastro-esophageal reflux disease without esophagitis: Secondary | ICD-10-CM | POA: Diagnosis not present

## 2022-01-29 DIAGNOSIS — Z7902 Long term (current) use of antithrombotics/antiplatelets: Secondary | ICD-10-CM | POA: Diagnosis not present

## 2022-01-31 DIAGNOSIS — I4891 Unspecified atrial fibrillation: Secondary | ICD-10-CM | POA: Diagnosis not present

## 2022-01-31 DIAGNOSIS — E1151 Type 2 diabetes mellitus with diabetic peripheral angiopathy without gangrene: Secondary | ICD-10-CM | POA: Diagnosis not present

## 2022-01-31 DIAGNOSIS — E785 Hyperlipidemia, unspecified: Secondary | ICD-10-CM | POA: Diagnosis not present

## 2022-02-02 DIAGNOSIS — F02811 Dementia in other diseases classified elsewhere, unspecified severity, with agitation: Secondary | ICD-10-CM | POA: Diagnosis not present

## 2022-02-02 DIAGNOSIS — Z8616 Personal history of COVID-19: Secondary | ICD-10-CM | POA: Diagnosis not present

## 2022-02-02 DIAGNOSIS — I2510 Atherosclerotic heart disease of native coronary artery without angina pectoris: Secondary | ICD-10-CM | POA: Diagnosis not present

## 2022-02-02 DIAGNOSIS — F02818 Dementia in other diseases classified elsewhere, unspecified severity, with other behavioral disturbance: Secondary | ICD-10-CM | POA: Diagnosis not present

## 2022-02-02 DIAGNOSIS — Z7982 Long term (current) use of aspirin: Secondary | ICD-10-CM | POA: Diagnosis not present

## 2022-02-02 DIAGNOSIS — F0282 Dementia in other diseases classified elsewhere, unspecified severity, with psychotic disturbance: Secondary | ICD-10-CM | POA: Diagnosis not present

## 2022-02-02 DIAGNOSIS — K635 Polyp of colon: Secondary | ICD-10-CM | POA: Diagnosis not present

## 2022-02-02 DIAGNOSIS — Z9181 History of falling: Secondary | ICD-10-CM | POA: Diagnosis not present

## 2022-02-02 DIAGNOSIS — E119 Type 2 diabetes mellitus without complications: Secondary | ICD-10-CM | POA: Diagnosis not present

## 2022-02-02 DIAGNOSIS — K219 Gastro-esophageal reflux disease without esophagitis: Secondary | ICD-10-CM | POA: Diagnosis not present

## 2022-02-02 DIAGNOSIS — I252 Old myocardial infarction: Secondary | ICD-10-CM | POA: Diagnosis not present

## 2022-02-02 DIAGNOSIS — I10 Essential (primary) hypertension: Secondary | ICD-10-CM | POA: Diagnosis not present

## 2022-02-02 DIAGNOSIS — I480 Paroxysmal atrial fibrillation: Secondary | ICD-10-CM | POA: Diagnosis not present

## 2022-02-02 DIAGNOSIS — E785 Hyperlipidemia, unspecified: Secondary | ICD-10-CM | POA: Diagnosis not present

## 2022-02-02 DIAGNOSIS — R296 Repeated falls: Secondary | ICD-10-CM | POA: Diagnosis not present

## 2022-02-02 DIAGNOSIS — E039 Hypothyroidism, unspecified: Secondary | ICD-10-CM | POA: Diagnosis not present

## 2022-02-02 DIAGNOSIS — Z7902 Long term (current) use of antithrombotics/antiplatelets: Secondary | ICD-10-CM | POA: Diagnosis not present

## 2022-02-02 DIAGNOSIS — Z7984 Long term (current) use of oral hypoglycemic drugs: Secondary | ICD-10-CM | POA: Diagnosis not present

## 2022-02-02 DIAGNOSIS — Z556 Problems related to health literacy: Secondary | ICD-10-CM | POA: Diagnosis not present

## 2022-02-02 DIAGNOSIS — G20 Parkinson's disease: Secondary | ICD-10-CM | POA: Diagnosis not present

## 2022-02-03 DIAGNOSIS — I480 Paroxysmal atrial fibrillation: Secondary | ICD-10-CM | POA: Diagnosis not present

## 2022-02-03 DIAGNOSIS — I2109 ST elevation (STEMI) myocardial infarction involving other coronary artery of anterior wall: Secondary | ICD-10-CM | POA: Diagnosis not present

## 2022-02-03 DIAGNOSIS — G20 Parkinson's disease: Secondary | ICD-10-CM | POA: Diagnosis not present

## 2022-02-05 DIAGNOSIS — F0282 Dementia in other diseases classified elsewhere, unspecified severity, with psychotic disturbance: Secondary | ICD-10-CM | POA: Diagnosis not present

## 2022-02-05 DIAGNOSIS — E119 Type 2 diabetes mellitus without complications: Secondary | ICD-10-CM | POA: Diagnosis not present

## 2022-02-05 DIAGNOSIS — Z7902 Long term (current) use of antithrombotics/antiplatelets: Secondary | ICD-10-CM | POA: Diagnosis not present

## 2022-02-05 DIAGNOSIS — K219 Gastro-esophageal reflux disease without esophagitis: Secondary | ICD-10-CM | POA: Diagnosis not present

## 2022-02-05 DIAGNOSIS — I2510 Atherosclerotic heart disease of native coronary artery without angina pectoris: Secondary | ICD-10-CM | POA: Diagnosis not present

## 2022-02-05 DIAGNOSIS — Z8616 Personal history of COVID-19: Secondary | ICD-10-CM | POA: Diagnosis not present

## 2022-02-05 DIAGNOSIS — I252 Old myocardial infarction: Secondary | ICD-10-CM | POA: Diagnosis not present

## 2022-02-05 DIAGNOSIS — I480 Paroxysmal atrial fibrillation: Secondary | ICD-10-CM | POA: Diagnosis not present

## 2022-02-05 DIAGNOSIS — G20 Parkinson's disease: Secondary | ICD-10-CM | POA: Diagnosis not present

## 2022-02-05 DIAGNOSIS — R296 Repeated falls: Secondary | ICD-10-CM | POA: Diagnosis not present

## 2022-02-05 DIAGNOSIS — E039 Hypothyroidism, unspecified: Secondary | ICD-10-CM | POA: Diagnosis not present

## 2022-02-05 DIAGNOSIS — E785 Hyperlipidemia, unspecified: Secondary | ICD-10-CM | POA: Diagnosis not present

## 2022-02-05 DIAGNOSIS — K635 Polyp of colon: Secondary | ICD-10-CM | POA: Diagnosis not present

## 2022-02-05 DIAGNOSIS — F02811 Dementia in other diseases classified elsewhere, unspecified severity, with agitation: Secondary | ICD-10-CM | POA: Diagnosis not present

## 2022-02-05 DIAGNOSIS — Z9181 History of falling: Secondary | ICD-10-CM | POA: Diagnosis not present

## 2022-02-05 DIAGNOSIS — Z556 Problems related to health literacy: Secondary | ICD-10-CM | POA: Diagnosis not present

## 2022-02-05 DIAGNOSIS — F02818 Dementia in other diseases classified elsewhere, unspecified severity, with other behavioral disturbance: Secondary | ICD-10-CM | POA: Diagnosis not present

## 2022-02-05 DIAGNOSIS — Z7982 Long term (current) use of aspirin: Secondary | ICD-10-CM | POA: Diagnosis not present

## 2022-02-05 DIAGNOSIS — Z7984 Long term (current) use of oral hypoglycemic drugs: Secondary | ICD-10-CM | POA: Diagnosis not present

## 2022-02-05 DIAGNOSIS — I2109 ST elevation (STEMI) myocardial infarction involving other coronary artery of anterior wall: Secondary | ICD-10-CM | POA: Diagnosis not present

## 2022-02-05 DIAGNOSIS — I10 Essential (primary) hypertension: Secondary | ICD-10-CM | POA: Diagnosis not present

## 2022-02-08 DIAGNOSIS — I480 Paroxysmal atrial fibrillation: Secondary | ICD-10-CM | POA: Diagnosis not present

## 2022-02-08 DIAGNOSIS — Z8616 Personal history of COVID-19: Secondary | ICD-10-CM | POA: Diagnosis not present

## 2022-02-08 DIAGNOSIS — Z7984 Long term (current) use of oral hypoglycemic drugs: Secondary | ICD-10-CM | POA: Diagnosis not present

## 2022-02-08 DIAGNOSIS — Z9181 History of falling: Secondary | ICD-10-CM | POA: Diagnosis not present

## 2022-02-08 DIAGNOSIS — E785 Hyperlipidemia, unspecified: Secondary | ICD-10-CM | POA: Diagnosis not present

## 2022-02-08 DIAGNOSIS — F02811 Dementia in other diseases classified elsewhere, unspecified severity, with agitation: Secondary | ICD-10-CM | POA: Diagnosis not present

## 2022-02-08 DIAGNOSIS — Z7902 Long term (current) use of antithrombotics/antiplatelets: Secondary | ICD-10-CM | POA: Diagnosis not present

## 2022-02-08 DIAGNOSIS — R296 Repeated falls: Secondary | ICD-10-CM | POA: Diagnosis not present

## 2022-02-08 DIAGNOSIS — I252 Old myocardial infarction: Secondary | ICD-10-CM | POA: Diagnosis not present

## 2022-02-08 DIAGNOSIS — F02818 Dementia in other diseases classified elsewhere, unspecified severity, with other behavioral disturbance: Secondary | ICD-10-CM | POA: Diagnosis not present

## 2022-02-08 DIAGNOSIS — I10 Essential (primary) hypertension: Secondary | ICD-10-CM | POA: Diagnosis not present

## 2022-02-08 DIAGNOSIS — G20 Parkinson's disease: Secondary | ICD-10-CM | POA: Diagnosis not present

## 2022-02-08 DIAGNOSIS — I2510 Atherosclerotic heart disease of native coronary artery without angina pectoris: Secondary | ICD-10-CM | POA: Diagnosis not present

## 2022-02-08 DIAGNOSIS — E119 Type 2 diabetes mellitus without complications: Secondary | ICD-10-CM | POA: Diagnosis not present

## 2022-02-08 DIAGNOSIS — Z7982 Long term (current) use of aspirin: Secondary | ICD-10-CM | POA: Diagnosis not present

## 2022-02-08 DIAGNOSIS — K635 Polyp of colon: Secondary | ICD-10-CM | POA: Diagnosis not present

## 2022-02-08 DIAGNOSIS — E039 Hypothyroidism, unspecified: Secondary | ICD-10-CM | POA: Diagnosis not present

## 2022-02-08 DIAGNOSIS — F0282 Dementia in other diseases classified elsewhere, unspecified severity, with psychotic disturbance: Secondary | ICD-10-CM | POA: Diagnosis not present

## 2022-02-08 DIAGNOSIS — Z556 Problems related to health literacy: Secondary | ICD-10-CM | POA: Diagnosis not present

## 2022-02-08 DIAGNOSIS — K219 Gastro-esophageal reflux disease without esophagitis: Secondary | ICD-10-CM | POA: Diagnosis not present

## 2022-02-13 ENCOUNTER — Other Ambulatory Visit: Payer: Self-pay | Admitting: *Deleted

## 2022-02-13 DIAGNOSIS — E785 Hyperlipidemia, unspecified: Secondary | ICD-10-CM | POA: Diagnosis not present

## 2022-02-13 DIAGNOSIS — F02818 Dementia in other diseases classified elsewhere, unspecified severity, with other behavioral disturbance: Secondary | ICD-10-CM | POA: Diagnosis not present

## 2022-02-13 DIAGNOSIS — E039 Hypothyroidism, unspecified: Secondary | ICD-10-CM | POA: Diagnosis not present

## 2022-02-13 DIAGNOSIS — K219 Gastro-esophageal reflux disease without esophagitis: Secondary | ICD-10-CM | POA: Diagnosis not present

## 2022-02-13 DIAGNOSIS — F02811 Dementia in other diseases classified elsewhere, unspecified severity, with agitation: Secondary | ICD-10-CM | POA: Diagnosis not present

## 2022-02-13 DIAGNOSIS — Z8616 Personal history of COVID-19: Secondary | ICD-10-CM | POA: Diagnosis not present

## 2022-02-13 DIAGNOSIS — Z9181 History of falling: Secondary | ICD-10-CM | POA: Diagnosis not present

## 2022-02-13 DIAGNOSIS — I2510 Atherosclerotic heart disease of native coronary artery without angina pectoris: Secondary | ICD-10-CM | POA: Diagnosis not present

## 2022-02-13 DIAGNOSIS — Z556 Problems related to health literacy: Secondary | ICD-10-CM | POA: Diagnosis not present

## 2022-02-13 DIAGNOSIS — I10 Essential (primary) hypertension: Secondary | ICD-10-CM | POA: Diagnosis not present

## 2022-02-13 DIAGNOSIS — F0282 Dementia in other diseases classified elsewhere, unspecified severity, with psychotic disturbance: Secondary | ICD-10-CM | POA: Diagnosis not present

## 2022-02-13 DIAGNOSIS — G20 Parkinson's disease: Secondary | ICD-10-CM | POA: Diagnosis not present

## 2022-02-13 DIAGNOSIS — Z7902 Long term (current) use of antithrombotics/antiplatelets: Secondary | ICD-10-CM | POA: Diagnosis not present

## 2022-02-13 DIAGNOSIS — E119 Type 2 diabetes mellitus without complications: Secondary | ICD-10-CM | POA: Diagnosis not present

## 2022-02-13 DIAGNOSIS — Z7984 Long term (current) use of oral hypoglycemic drugs: Secondary | ICD-10-CM | POA: Diagnosis not present

## 2022-02-13 DIAGNOSIS — Z7982 Long term (current) use of aspirin: Secondary | ICD-10-CM | POA: Diagnosis not present

## 2022-02-13 DIAGNOSIS — I480 Paroxysmal atrial fibrillation: Secondary | ICD-10-CM | POA: Diagnosis not present

## 2022-02-13 DIAGNOSIS — R296 Repeated falls: Secondary | ICD-10-CM | POA: Diagnosis not present

## 2022-02-13 DIAGNOSIS — K635 Polyp of colon: Secondary | ICD-10-CM | POA: Diagnosis not present

## 2022-02-13 DIAGNOSIS — I252 Old myocardial infarction: Secondary | ICD-10-CM | POA: Diagnosis not present

## 2022-02-13 NOTE — Patient Outreach (Signed)
  Care Coordination   02/13/2022 Name: Gerald Leach MRN: [id number] DOB: 12-Nov-1933   Care Coordination Outreach Attempts:  An unsuccessful telephone outreach was attempted today to offer the patient information about available care coordination services as a benefit of their health plan.   Follow Up Plan:  Additional outreach attempts will be made to offer the patient care coordination information and services.   Encounter Outcome:  No Answer  Care Coordination Interventions Activated:  No   Care Coordination Interventions:  No, not indicated    Raina Mina, RN Care Management Coordinator [place] Office [phone]

## 2022-02-18 ENCOUNTER — Emergency Department (HOSPITAL_COMMUNITY): Payer: Medicare Other

## 2022-02-18 ENCOUNTER — Emergency Department (HOSPITAL_COMMUNITY)
Admission: EM | Admit: 2022-02-18 | Discharge: 2022-02-19 | Disposition: A | Discharge status: Home / Self Care | Payer: Medicare Other | Attending: Emergency Medicine | Admitting: Emergency Medicine

## 2022-02-18 ENCOUNTER — Other Ambulatory Visit: Payer: Self-pay

## 2022-02-18 DIAGNOSIS — Z7902 Long term (current) use of antithrombotics/antiplatelets: Secondary | ICD-10-CM | POA: Insufficient documentation

## 2022-02-18 DIAGNOSIS — Z043 Encounter for examination and observation following other accident: Secondary | ICD-10-CM | POA: Diagnosis not present

## 2022-02-18 DIAGNOSIS — F0390 Unspecified dementia without behavioral disturbance: Secondary | ICD-10-CM | POA: Diagnosis not present

## 2022-02-18 DIAGNOSIS — E119 Type 2 diabetes mellitus without complications: Secondary | ICD-10-CM | POA: Insufficient documentation

## 2022-02-18 DIAGNOSIS — W19XXXA Unspecified fall, initial encounter: Secondary | ICD-10-CM | POA: Insufficient documentation

## 2022-02-18 DIAGNOSIS — G20 Parkinson's disease: Secondary | ICD-10-CM | POA: Insufficient documentation

## 2022-02-18 DIAGNOSIS — M160 Bilateral primary osteoarthritis of hip: Secondary | ICD-10-CM | POA: Diagnosis not present

## 2022-02-18 DIAGNOSIS — Z743 Need for continuous supervision: Secondary | ICD-10-CM | POA: Diagnosis not present

## 2022-02-18 DIAGNOSIS — S0990XA Unspecified injury of head, initial encounter: Secondary | ICD-10-CM | POA: Insufficient documentation

## 2022-02-18 DIAGNOSIS — J9811 Atelectasis: Secondary | ICD-10-CM | POA: Diagnosis not present

## 2022-02-18 DIAGNOSIS — Z7982 Long term (current) use of aspirin: Secondary | ICD-10-CM | POA: Diagnosis not present

## 2022-02-18 DIAGNOSIS — I480 Paroxysmal atrial fibrillation: Secondary | ICD-10-CM | POA: Insufficient documentation

## 2022-02-18 DIAGNOSIS — R9431 Abnormal electrocardiogram [ECG] [EKG]: Secondary | ICD-10-CM | POA: Diagnosis not present

## 2022-02-18 DIAGNOSIS — S143XXA Injury of brachial plexus, initial encounter: Secondary | ICD-10-CM | POA: Diagnosis not present

## 2022-02-18 DIAGNOSIS — G8929 Other chronic pain: Secondary | ICD-10-CM | POA: Diagnosis not present

## 2022-02-18 LAB — CBC WITH DIFFERENTIAL/PLATELET
Abs Immature Granulocytes: 0.02 10*3/uL (ref 0.00–0.07)
Basophils Absolute: 0 10*3/uL (ref 0.0–0.1)
Basophils Relative: 1 %
Eosinophils Absolute: 0.1 10*3/uL (ref 0.0–0.5)
Eosinophils Relative: 2 %
HCT: 42.2 % (ref 39.0–52.0)
Hemoglobin: 14.1 g/dL (ref 13.0–17.0)
Immature Granulocytes: 0 %
Lymphocytes Relative: 21 %
Lymphs Abs: 1.4 10*3/uL (ref 0.7–4.0)
MCH: 29.8 pg (ref 26.0–34.0)
MCHC: 33.4 g/dL (ref 30.0–36.0)
MCV: 89.2 fL (ref 80.0–100.0)
Monocytes Absolute: 0.8 10*3/uL (ref 0.1–1.0)
Monocytes Relative: 11 %
Neutro Abs: 4.3 10*3/uL (ref 1.7–7.7)
Neutrophils Relative %: 65 %
Platelets: 233 10*3/uL (ref 150–400)
RBC: 4.73 MIL/uL (ref 4.22–5.81)
RDW: 13.7 % (ref 11.5–15.5)
WBC: 6.6 10*3/uL (ref 4.0–10.5)
nRBC: 0 % (ref 0.0–0.2)

## 2022-02-18 LAB — COMPREHENSIVE METABOLIC PANEL
ALT: 12 U/L (ref 0–44)
AST: 17 U/L (ref 15–41)
Albumin: 3.6 g/dL (ref 3.5–5.0)
Alkaline Phosphatase: 76 U/L (ref 38–126)
Anion gap: 12 (ref 5–15)
BUN: 20 mg/dL (ref 8–23)
CO2: 25 mmol/L (ref 22–32)
Calcium: 9.1 mg/dL (ref 8.9–10.3)
Chloride: 100 mmol/L (ref 98–111)
Creatinine, Ser: 1.09 mg/dL (ref 0.61–1.24)
GFR, Estimated: >60 mL/min (ref >60)
Glucose, Bld: 104 mg/dL — ABNORMAL HIGH (ref 70–99)
Potassium: 3.9 mmol/L (ref 3.5–5.1)
Sodium: 137 mmol/L (ref 135–145)
Total Bilirubin: 0.8 mg/dL (ref 0.3–1.2)
Total Protein: 7 g/dL (ref 6.5–8.1)

## 2022-02-18 LAB — URINALYSIS, ROUTINE W REFLEX MICROSCOPIC
Bacteria, UA: NONE SEEN
Bilirubin Urine: NEGATIVE
Glucose, UA: NEGATIVE mg/dL
Hgb urine dipstick: NEGATIVE
Ketones, ur: 5 mg/dL — AB
Leukocytes,Ua: NEGATIVE
Nitrite: NEGATIVE
Protein, ur: 30 mg/dL — AB
Specific Gravity, Urine: 1.03 (ref 1.005–1.030)
pH: 5 (ref 5.0–8.0)
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
## 2022-02-18 NOTE — Progress Notes (Signed)
Orthopedic Tech Progress Note Patient Details:  Gerald Leach 01-06-1934 [id number]  [name], male   DOB: 1933-08-05, 88 y.o.   MRN: [id number] I attended trauma page Karolee Stamps 02/18/2022, 10:21 PM
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
## 2022-02-18 NOTE — ED Provider Notes (Signed)
[facility] EMERGENCY DEPARTMENT Provider Note   CSN: [id number] Arrival date & time: 02/18/22  2015     History  Chief Complaint  Patient presents with   Gerald Leach is a 88 y.o. male.  With PMH of HLD, DM 2, dementia, Parkinson's disease, seizure disorder, paroxysmal A-fib on Plavix brought in by EMS after unwitnessed fall at facility with head injury and no LOC.  Patient is demented at baseline and unable to provide good history but he is denying any pain in his body.  Denies any headache or nausea.  Denies any weakness or numbness or pain in his extremities.  He does not remember the fall today.  Nursing staff spoke to facility who noted that he is intermittently altered with underlying dementia.  He also requires assistance with walking at his facility.   Fall       Home Medications Prior to Admission medications   Medication Sig Start Date End Date Taking? Authorizing Provider  acetaminophen (TYLENOL) 325 MG tablet Take 2 tablets (650 mg total) by mouth every 6 (six) hours as needed for mild pain (or Fever >/= 101). 03/24/21   Raiford Noble Latif, DO  amantadine (SYMMETREL) 100 MG capsule Take 1 capsule (100 mg total) by mouth daily. Patient taking differently: Take 100 mg by mouth at bedtime. 05/18/21   Narda Amber K, DO  amiodarone (PACERONE) 200 MG tablet Take 1 tablet (200 mg total) by mouth daily. 09/15/21   Kathie Dike, MD  aspirin EC 81 MG EC tablet Take 1 tablet (81 mg total) by mouth daily. Swallow whole. 09/15/21   Kathie Dike, MD  atorvastatin (LIPITOR) 40 MG tablet Take 1 tablet (40 mg total) by mouth daily. 09/15/21   Kathie Dike, MD  carbidopa-levodopa (SINEMET IR) 25-100 MG tablet TAKE 1 TABLET BY MOUTH THREE TIMES A DAY Patient taking differently: Take 1 tablet by mouth 3 (three) times daily. 07/04/21   Narda Amber K, DO  clopidogrel (PLAVIX) 75 MG tablet Take 1 tablet (75 mg total) by mouth daily. 09/15/21   Kathie Dike, MD  ferrous sulfate 325 (65 FE) MG tablet Take 325 mg by mouth daily with breakfast.    [provider]  Lacosamide 150 MG TABS Take 1 tablet (150 mg total) by mouth 2 (two) times daily. 05/31/21   Patel, Donika K, DO  levETIRAcetam (KEPPRA) 750 MG tablet TAKE 2 TABLETS (1,500 MG TOTAL) BY MOUTH 2 (TWO) TIMES DAILY. 06/26/21   Narda Amber K, DO  levothyroxine (SYNTHROID) 75 MCG tablet TAKE 1 TABLET BY MOUTH EVERY DAY Patient taking differently: Take 75 mcg by mouth daily before breakfast. 07/04/21   Nafziger, Tommi Rumps, NP  LORazepam (ATIVAN) 1 MG tablet Take 1 tablet (1 mg total) by mouth every 6 (six) hours as needed for anxiety (dyspnea). 09/14/21   Kathie Dike, MD  Morphine Sulfate (MORPHINE CONCENTRATE) 10 MG/0.5ML SOLN concentrated solution Take 0.25 mLs (5 mg total) by mouth every 4 (four) hours as needed for moderate pain (or dyspnea). 09/14/21   Kathie Dike, MD  nitroGLYCERIN (NITROSTAT) 0.4 MG SL tablet Place 1 tablet (0.4 mg total) under the tongue every 5 (five) minutes x 3 doses as needed for chest pain. 09/14/21   Kathie Dike, MD  omeprazole (PRILOSEC) 20 MG capsule TAKE 1 CAPSULE BY MOUTH EVERY DAY Patient taking differently: Take 20 mg by mouth daily. 05/10/21   Nafziger, Tommi Rumps, NP  ondansetron (ZOFRAN) 4 MG tablet Take 1 tablet (4  mg total) by mouth every 6 (six) hours as needed for nausea. 03/24/21   Raiford Noble Latif, DO  vitamin B-12 (CYANOCOBALAMIN) 1000 MCG tablet Take 1,000 mcg by mouth daily.    [provider]      Allergies    Patient has no known allergies.    Review of Systems   Review of Systems  Physical Exam Updated Vital Signs BP (!) 141/98   Pulse 63   Temp 98.4 F (36.9 C) (Oral)   Resp (!) 21   Ht 6' (1.829 m)   Wt 65 kg   SpO2 99%   BMI 19.43 kg/m  Physical Exam Constitutional: Alert and oriented to person and place not situation, following commands, nontoxic, NAD. Eyes: Conjunctivae are normal. ENT      Head:  Normocephalic and atraumatic.      Nose: No congestion.      Mouth/Throat: Mucous membranes are moist.      Neck: No stridor.  No midline tenderness step-offs or deformities of spine. Cardiovascular: S1, S2,  Normal and symmetric distal pulses are present in all extremities.Warm and well perfused. Respiratory: Normal respiratory effort. Breath sounds are normal. Gastrointestinal: Soft and nontender.  Musculoskeletal: Normal range of motion in all extremities. Neurologic: Oriented to person, place, not situation.  He is intermittently moving upper and lower extremities equally bilaterally.  Sensation is grossly intact.  No facial droop. Skin: Skin is warm, dry.  Scattered old ecchymoses of bilateral upper extremities. Psychiatric: Mood and affect are normal. Speech and behavior are normal.  ED Results / Procedures / Treatments   Labs (all labs ordered are listed, but only abnormal results are displayed) Labs Reviewed  COMPREHENSIVE METABOLIC PANEL - Abnormal; Notable for the following components:      Result Value   Glucose, Bld 104 (*)    All other components within normal limits  URINALYSIS, ROUTINE W REFLEX MICROSCOPIC - Abnormal; Notable for the following components:   Color, Urine AMBER (*)    Ketones, ur 5 (*)    Protein, ur 30 (*)    All other components within normal limits  CBC WITH DIFFERENTIAL/PLATELET  URINALYSIS, ROUTINE W REFLEX MICROSCOPIC    EKG EKG Interpretation  Date/Time:  Sunday February 18 2022 20:27:07 EDT Ventricular Rate:  70 PR Interval:    QRS Duration: 108 QT Interval:  445 QTC Calculation: 481 R Axis:   -41 Text Interpretation: Atrial fibrillation Left anterior fascicular block Borderline low voltage, extremity leads Anteroseptal infarct, old No acute changes Confirmed by Georgina Snell [phone]) on 02/18/2022 9:15:59 PM  Radiology CT Soft Tissue Neck Wo Contrast  Result Date: 02/18/2022 CLINICAL DATA:  Initial evaluation for traumatic brachial  plexopathy. EXAM: CT NECK WITHOUT CONTRAST TECHNIQUE: Multidetector CT imaging of the neck was performed following the standard protocol without intravenous contrast. RADIATION DOSE REDUCTION: This exam was performed according to the departmental dose-optimization program which includes automated exposure control, adjustment of the mA and/or kV according to patient size and/or use of iterative reconstruction technique. COMPARISON:  None Available. FINDINGS: Pharynx and larynx: Oral cavity within normal limits. No visible acute abnormality about the dentition. Oropharynx and nasopharynx within normal limits. No retropharyngeal collection or swelling. Negative epiglottis. Vallecula clear. Hypopharynx and supraglottic larynx within normal limits. Negative glottis. Subglottic airway patent clear. Salivary glands: Salivary glands including the parotid and submandibular glands are within normal limits. Thyroid: Unremarkable. Lymph nodes: No enlarged or pathologic adenopathy within the neck. Vascular: Moderate atheromatous change about the aortic arch and  carotid bifurcations. Limited intracranial: Unremarkable. Visualized orbits: Unremarkable. Mastoids and visualized paranasal sinuses: Visualized paranasal sinuses are largely clear. Visualized mastoids and middle ear cavities are well pneumatized and free of fluid. Skeleton: No discrete or worrisome osseous lesions. Mild chronic compression deformity noted at the superior endplate of T3. Underlying mild cervical spondylosis for age. Remotely healed fracture of the left first rib noted. Additional multiple remotely healed right-sided rib fractures noted as well. Upper chest: Visualized upper chest demonstrates no acute finding. Other: No other visible soft tissue injury about the neck. IMPRESSION: 1. Negative CT of the neck. No acute traumatic injury or other abnormality identified. 2. Remotely healed bilateral rib fractures. 3. Mild chronic compression deformity at the  superior endplate of T3. Aortic Atherosclerosis (ICD10-I70.0). Electronically Signed   By: Jeannine Boga M.D.   On: 02/18/2022 21:11   CT Head Wo Contrast  Result Date: 02/18/2022 CLINICAL DATA:  Head trauma. EXAM: CT HEAD WITHOUT CONTRAST TECHNIQUE: Contiguous axial images were obtained from the base of the skull through the vertex without intravenous contrast. RADIATION DOSE REDUCTION: This exam was performed according to the departmental dose-optimization program which includes automated exposure control, adjustment of the mA and/or kV according to patient size and/or use of iterative reconstruction technique. COMPARISON:  Head CT dated 03/17/2021. FINDINGS: Brain: Moderate age-related atrophy and chronic microvascular ischemic changes. There is no acute intracranial hemorrhage. No mass effect or midline shift. No extra-axial fluid collection. Vascular: Stable 2.5 cm calcified right ICA aneurysm. Skull: No acute calvarial pathology. Right frontoparietal craniotomy. Sinuses/Orbits: No acute finding. Other: None IMPRESSION: 1. No acute intracranial pathology. 2. Moderate age-related atrophy and chronic microvascular ischemic changes. 3. Stable 2.5 cm calcified right ICA aneurysm. Electronically Signed   By: Anner Crete M.D.   On: 02/18/2022 20:47   DG Pelvis 1-2 Views  Result Date: 02/18/2022 CLINICAL DATA:  Fall. EXAM: PELVIS - 1-2 VIEW COMPARISON:  None Available. FINDINGS: There is no evidence of pelvic fracture or diastasis. No pelvic bone lesions are seen. Degenerative changes are noted in the lower lumbar spine and bilateral hips. IMPRESSION: No acute fracture or dislocation. Electronically Signed   By: Brett Fairy M.D.   On: 02/18/2022 20:37   DG Chest Portable 1 View  Result Date: 02/18/2022 CLINICAL DATA:  Unwitnessed fall with head injury. EXAM: PORTABLE CHEST 1 VIEW COMPARISON:  09/03/2021. FINDINGS: The heart is enlarged the mediastinal contour stable. Atherosclerotic  calcification of the aorta is noted. Lung volumes are low with mild atelectasis at the lung bases. No effusion or pneumothorax. Old rib fractures are noted bilaterally. No acute osseous abnormality. IMPRESSION: 1. Low lung volumes with atelectasis at the lung bases. 2. Cardiomegaly. Electronically Signed   By: Brett Fairy M.D.   On: 02/18/2022 20:35    Procedures Procedures    Medications Ordered in ED Medications - No data to display  ED Course/ Medical Decision Making/ A&P                           Medical Decision Making ZADE FALKNER is a 88 y.o. male.  With PMH of HLD, DM 2, dementia, Parkinson's disease, seizure disorder, paroxysmal A-fib on Plavix brought in by EMS after unwitnessed fall at facility with head injury and no LOC.   Patient's primary trauma survey was intact.  Secondary trauma survey showed no acute findings.  He appears to be at mental baseline with underlying dementia.  His imaging of the head,  neck, chest and pelvis showed no acute traumatic injuries.  He has been able to stand up with assistance which also seems consistent with his baseline with underlying Parkinson's.  His labs were generally unremarkable with no acute electrolyte abnormalities.  UA with no evidence of UTI.  EKG consistent with previous.  Since he is at baseline mental status and mobility status, he is safe for discharge back to his facility.  Amount and/or Complexity of Data Reviewed Labs: ordered. Radiology: ordered.    Final Clinical Impression(s) / ED Diagnoses Final diagnoses:  Fall, initial encounter  Injury of head, initial encounter    Rx / DC Orders ED Discharge Orders     None         Elgie Congo, MD 02/18/22 2357

## 2022-02-18 NOTE — ED Notes (Signed)
Attempted to use urinal to obtain sample for UA, unable to produce urinae at this time. Will try again shortly and consider requesting orders for in/out if still unsuccessful

## 2022-02-18 NOTE — ED Notes (Signed)
Trauma Response Nurse Documentation   Gerald Leach is a 88 y.o. male arriving to [facility] ED via [facility]  On clopidogrel 75 mg daily. Trauma was activated as a Level 2 by Margarita Sermons based on the following trauma criteria Elderly patients > 65 with head trauma on anti-coagulation (excluding ASA). Trauma team at the bedside on patient arrival.   Patient cleared for CT by Dr. Nechama Guard. Pt transported to [place] with trauma response nurse present to monitor. RN remained with the patient throughout their absence from the department for clinical observation.   GCS 15.  History   Past Medical History:  Diagnosis Date   Brain aneurysm    Colonic polyp 2003   Dementia ([place])    Diabetes mellitus without complication [facility])    ED (erectile dysfunction)    GERD (gastroesophageal reflux disease)    Hyperlipidemia    Hypertension    Hypothyroidism    Seizures ([place])    per family   Seizures [facility])      Past Surgical History:  Procedure Laterality Date   Aneurysmal clipping     brain aneurysm surgery     at [facility] 2002   carotid artery aneurysm     COLONOSCOPY     polyps   CRANIOTOMY Right 09/19/2012   Procedure: CRANIOTOMY HEMATOMA EVACUATION SUBDURAL;  Surgeon: Otilio Connors, MD;  Location: [place] NEURO ORS;  Service: Neurosurgery;  Laterality: Right;   CRANIOTOMY Right 09/22/2012   Procedure: CRANIOTOMY HEMATOMA EVACUATION SUBDURAL;  Surgeon: Otilio Connors, MD;  Location: [place] NEURO ORS;  Service: Neurosurgery;  Laterality: Right;   Craniotomy.     right hernia         Initial Focused Assessment (If applicable, or please see trauma documentation): Airway-- intact Breathing-- spontaneous and unlabored Circulation-- no apparent bleeding noted  CT's Completed:   CT Head and CT C-Spine   Interventions:   Plan for disposition:  Discharge home   Consults completed:  none at 2305.  Event Summary: Patient brought in by [facility] EMS, complaint of multiple falls  today, striking back of head. Upon arrival, patient alert, answering questions appropriately. Patient in c collar via EMS. Trauma labs obtained, 20 G PIV LAC initiated. Xray chest and pelvis completed. Patient to CT with TRN, due to possible head injury. Patient had CT head, c-spine, soft tissue neck completed.    Bedside handoff with ED RN [place].    Trudee Kuster  Trauma Response RN  Please call TRN at [phone] for further assistance.

## 2022-02-18 NOTE — ED Notes (Signed)
X2 assist needed to stand patient, EDP notified.

## 2022-02-19 DIAGNOSIS — Z7401 Bed confinement status: Secondary | ICD-10-CM | POA: Diagnosis not present

## 2022-02-19 DIAGNOSIS — Z743 Need for continuous supervision: Secondary | ICD-10-CM | POA: Diagnosis not present

## 2022-02-19 DIAGNOSIS — R404 Transient alteration of awareness: Secondary | ICD-10-CM | POA: Diagnosis not present

## 2022-02-19 DIAGNOSIS — W19XXXA Unspecified fall, initial encounter: Secondary | ICD-10-CM | POA: Diagnosis not present

## 2022-02-19 NOTE — ED Notes (Signed)
Report given to nurse, Theodoro Parma, at [facility]. Pt transported back to facility via [facility]

## 2022-02-21 DIAGNOSIS — I480 Paroxysmal atrial fibrillation: Secondary | ICD-10-CM | POA: Diagnosis not present

## 2022-02-21 DIAGNOSIS — W010XXA Fall on same level from slipping, tripping and stumbling without subsequent striking against object, initial encounter: Secondary | ICD-10-CM | POA: Diagnosis not present

## 2022-02-21 DIAGNOSIS — G20 Parkinson's disease: Secondary | ICD-10-CM | POA: Diagnosis not present

## 2022-02-28 DIAGNOSIS — S0081XA Abrasion of other part of head, initial encounter: Secondary | ICD-10-CM | POA: Diagnosis not present

## 2022-02-28 DIAGNOSIS — W010XXA Fall on same level from slipping, tripping and stumbling without subsequent striking against object, initial encounter: Secondary | ICD-10-CM | POA: Diagnosis not present

## 2022-02-28 DIAGNOSIS — M170 Bilateral primary osteoarthritis of knee: Secondary | ICD-10-CM | POA: Diagnosis not present

## 2022-02-28 DIAGNOSIS — G20 Parkinson's disease: Secondary | ICD-10-CM | POA: Diagnosis not present

## 2022-02-28 DIAGNOSIS — I480 Paroxysmal atrial fibrillation: Secondary | ICD-10-CM | POA: Diagnosis not present

## 2022-03-06 DIAGNOSIS — I2109 ST elevation (STEMI) myocardial infarction involving other coronary artery of anterior wall: Secondary | ICD-10-CM | POA: Diagnosis not present

## 2022-03-06 DIAGNOSIS — G20 Parkinson's disease: Secondary | ICD-10-CM | POA: Diagnosis not present

## 2022-03-06 DIAGNOSIS — I480 Paroxysmal atrial fibrillation: Secondary | ICD-10-CM | POA: Diagnosis not present

## 2022-03-07 DIAGNOSIS — G20 Parkinson's disease: Secondary | ICD-10-CM | POA: Diagnosis not present

## 2022-03-07 DIAGNOSIS — Z79899 Other long term (current) drug therapy: Secondary | ICD-10-CM | POA: Diagnosis not present

## 2022-03-07 DIAGNOSIS — I480 Paroxysmal atrial fibrillation: Secondary | ICD-10-CM | POA: Diagnosis not present

## 2022-03-07 DIAGNOSIS — Z8669 Personal history of other diseases of the nervous system and sense organs: Secondary | ICD-10-CM | POA: Diagnosis not present

## 2022-03-08 DIAGNOSIS — G20 Parkinson's disease: Secondary | ICD-10-CM | POA: Diagnosis not present

## 2022-03-08 DIAGNOSIS — I480 Paroxysmal atrial fibrillation: Secondary | ICD-10-CM | POA: Diagnosis not present

## 2022-03-08 DIAGNOSIS — I2109 ST elevation (STEMI) myocardial infarction involving other coronary artery of anterior wall: Secondary | ICD-10-CM | POA: Diagnosis not present

## 2022-03-12 DIAGNOSIS — Z79899 Other long term (current) drug therapy: Secondary | ICD-10-CM | POA: Diagnosis not present

## 2022-03-21 DIAGNOSIS — R319 Hematuria, unspecified: Secondary | ICD-10-CM | POA: Diagnosis not present

## 2022-03-21 DIAGNOSIS — I480 Paroxysmal atrial fibrillation: Secondary | ICD-10-CM | POA: Diagnosis not present

## 2022-03-21 DIAGNOSIS — E039 Hypothyroidism, unspecified: Secondary | ICD-10-CM | POA: Diagnosis not present

## 2022-03-26 ENCOUNTER — Emergency Department (HOSPITAL_COMMUNITY): Payer: Medicare Other

## 2022-03-26 ENCOUNTER — Emergency Department (HOSPITAL_COMMUNITY)
Admission: EM | Admit: 2022-03-26 | Discharge: 2022-03-26 | Disposition: A | Discharge status: Skilled Nursing Facility | Payer: Medicare Other | Attending: Emergency Medicine | Admitting: Emergency Medicine

## 2022-03-26 DIAGNOSIS — Z7902 Long term (current) use of antithrombotics/antiplatelets: Secondary | ICD-10-CM | POA: Insufficient documentation

## 2022-03-26 DIAGNOSIS — Z7982 Long term (current) use of aspirin: Secondary | ICD-10-CM | POA: Diagnosis not present

## 2022-03-26 DIAGNOSIS — Y92129 Unspecified place in nursing home as the place of occurrence of the external cause: Secondary | ICD-10-CM | POA: Insufficient documentation

## 2022-03-26 DIAGNOSIS — S0081XA Abrasion of other part of head, initial encounter: Secondary | ICD-10-CM | POA: Insufficient documentation

## 2022-03-26 DIAGNOSIS — Z79899 Other long term (current) drug therapy: Secondary | ICD-10-CM | POA: Diagnosis not present

## 2022-03-26 DIAGNOSIS — T1490XA Injury, unspecified, initial encounter: Secondary | ICD-10-CM | POA: Diagnosis not present

## 2022-03-26 DIAGNOSIS — W19XXXA Unspecified fall, initial encounter: Secondary | ICD-10-CM

## 2022-03-26 DIAGNOSIS — R6889 Other general symptoms and signs: Secondary | ICD-10-CM | POA: Diagnosis not present

## 2022-03-26 DIAGNOSIS — Z743 Need for continuous supervision: Secondary | ICD-10-CM | POA: Diagnosis not present

## 2022-03-26 DIAGNOSIS — Z7401 Bed confinement status: Secondary | ICD-10-CM | POA: Diagnosis not present

## 2022-03-26 DIAGNOSIS — S0993XA Unspecified injury of face, initial encounter: Secondary | ICD-10-CM | POA: Diagnosis present

## 2022-03-26 DIAGNOSIS — S0990XA Unspecified injury of head, initial encounter: Secondary | ICD-10-CM | POA: Diagnosis not present

## 2022-03-26 DIAGNOSIS — R0902 Hypoxemia: Secondary | ICD-10-CM | POA: Diagnosis not present

## 2022-03-26 LAB — COMPREHENSIVE METABOLIC PANEL WITH GFR
ALT: 15 U/L (ref 0–44)
AST: 20 U/L (ref 15–41)
Albumin: 3.5 g/dL (ref 3.5–5.0)
Alkaline Phosphatase: 75 U/L (ref 38–126)
Anion gap: 6 (ref 5–15)
BUN: 10 mg/dL (ref 8–23)
CO2: 27 mmol/L (ref 22–32)
Calcium: 8.5 mg/dL — ABNORMAL LOW (ref 8.9–10.3)
Chloride: 103 mmol/L (ref 98–111)
Creatinine, Ser: 0.84 mg/dL (ref 0.61–1.24)
GFR, Estimated: >60 mL/min
Glucose, Bld: 93 mg/dL (ref 70–99)
Potassium: 3.9 mmol/L (ref 3.5–5.1)
Sodium: 136 mmol/L (ref 135–145)
Total Bilirubin: 0.5 mg/dL (ref 0.3–1.2)
Total Protein: 6.8 g/dL (ref 6.5–8.1)

## 2022-03-26 LAB — URINALYSIS, ROUTINE W REFLEX MICROSCOPIC
Bilirubin Urine: NEGATIVE
Glucose, UA: NEGATIVE mg/dL
Hgb urine dipstick: NEGATIVE
Ketones, ur: NEGATIVE mg/dL
Leukocytes,Ua: NEGATIVE
Nitrite: NEGATIVE
Protein, ur: NEGATIVE mg/dL
Specific Gravity, Urine: 1.012 (ref 1.005–1.030)
pH: 8 (ref 5.0–8.0)

## 2022-03-26 LAB — CBC
HCT: 40.4 % (ref 39.0–52.0)
Hemoglobin: 13.3 g/dL (ref 13.0–17.0)
MCH: 30.2 pg (ref 26.0–34.0)
MCHC: 32.9 g/dL (ref 30.0–36.0)
MCV: 91.8 fL (ref 80.0–100.0)
Platelets: 253 10*3/uL (ref 150–400)
RBC: 4.4 MIL/uL (ref 4.22–5.81)
RDW: 14.1 % (ref 11.5–15.5)
WBC: 9.1 10*3/uL (ref 4.0–10.5)
nRBC: 0 % (ref 0.0–0.2)

## 2022-03-26 MED ORDER — SODIUM CHLORIDE 0.9 % IV BOLUS
1000.0000 mL | Freq: Once | INTRAVENOUS | Status: AC
  Administered 2022-03-26: 1000 mL via INTRAVENOUS

## 2022-03-26 NOTE — ED Triage Notes (Signed)
Pt BIB [facility] from [place] facility. [address] out of wheelchair face down.  Bruise on the forehead. Pt on Plavix. A&O X2 at baseline.   168/96 Hr 70 Cbg 109

## 2022-03-26 NOTE — Discharge Instructions (Signed)
Your CT scans and labs are unremarkable today.  Fall precautions at the facility  See your doctor for follow-up  Return to ER if you have another fall, headache

## 2022-03-26 NOTE — ED Notes (Signed)
Trauma Response Nurse Documentation   Gerald Leach is a 88 y.o. male arriving to [facility] ED via EMS  On clopidogrel 75 mg daily. Trauma was activated as a Level 2 by ED Charge RN based on the following trauma criteria Elderly patients > 65 with head trauma on anti-coagulation (excluding ASA). Trauma team at the bedside on patient arrival.   Patient cleared for CT by Dr. Zenia Leach. Pt transported to CT with trauma response nurse present to monitor. RN remained with the patient throughout their absence from the department for clinical observation.   GCS 14.  History   Past Medical History:  Diagnosis Date   Brain aneurysm    Colonic polyp 2003   Dementia ([place])    Diabetes mellitus without complication [facility])    ED (erectile dysfunction)    GERD (gastroesophageal reflux disease)    Hyperlipidemia    Hypertension    Hypothyroidism    Seizures ([place])    per family   Seizures [facility])      Past Surgical History:  Procedure Laterality Date   Aneurysmal clipping     brain aneurysm surgery     at [facility] 2002   carotid artery aneurysm     COLONOSCOPY     polyps   CRANIOTOMY Right 09/19/2012   Procedure: CRANIOTOMY HEMATOMA EVACUATION SUBDURAL;  Surgeon: Gerald Connors, MD;  Location: [place] NEURO ORS;  Service: Neurosurgery;  Laterality: Right;   CRANIOTOMY Right 09/22/2012   Procedure: CRANIOTOMY HEMATOMA EVACUATION SUBDURAL;  Surgeon: Gerald Connors, MD;  Location: [place] NEURO ORS;  Service: Neurosurgery;  Laterality: Right;   Craniotomy.     right hernia        Initial Focused Assessment (If applicable, or please see trauma documentation): - Pt oriented x2 (baseline) - C-collar in place - Abrasion/ hematoma to pt's forehead. No bleeding - C/O no pain  CT's Completed:   CT Head and CT C-Spine   Interventions:  - 20G PIV to L upper arm - CBC and BMP - CT head and c-spine - Manual BP obtained - Assessed pt's back  Plan for disposition:  Other Awaiting scan results  Consults  completed:  none at 1400.  Event Summary: Pt was BIB [facility] after sustaining a fall.  Pt is from [place] facility.  Staff claims pt fell out of wheelchair face down.  No LOC.  Bruising and abrasion to forehead.  Pt on Plavix.  Bedside handoff with ED RN Gerald Leach.    Clovis Cao  Trauma Response RN  Please call TRN at [phone] for further assistance.

## 2022-03-26 NOTE — ED Notes (Signed)
[facility] called for transport.  

## 2022-03-26 NOTE — ED Provider Notes (Signed)
[place] EMERGENCY DEPARTMENT Provider Note   CSN: [id number] Arrival date & time: 03/26/22  1327     History  No chief complaint on file.   Gerald Leach is a 88 y.o. male.  88 year old male presents after unwitnessed fall from nursing home.  Patient is on Plavix and did strike the front of his head.  Unknown loss of consciousness.  Patient planes of no headache at this time.  No nausea or vomiting.  Does have an abrasion to his forehead.  Denies any neck discomfort.  No new peripheral weakness in his arms or legs but according EMS staff's states patient has had more unsteady gait recently.  Patient himself denies any dizziness or visual changes       Home Medications Prior to Admission medications   Medication Sig Start Date End Date Taking? Authorizing Provider  acetaminophen (TYLENOL) 325 MG tablet Take 2 tablets (650 mg total) by mouth every 6 (six) hours as needed for mild pain (or Fever >/= 101). 03/24/21   Raiford Noble Latif, DO  amantadine (SYMMETREL) 100 MG capsule Take 1 capsule (100 mg total) by mouth daily. Patient taking differently: Take 100 mg by mouth at bedtime. 05/18/21   Narda Amber K, DO  amiodarone (PACERONE) 200 MG tablet Take 1 tablet (200 mg total) by mouth daily. 09/15/21   Kathie Dike, MD  aspirin EC 81 MG EC tablet Take 1 tablet (81 mg total) by mouth daily. Swallow whole. 09/15/21   Kathie Dike, MD  atorvastatin (LIPITOR) 40 MG tablet Take 1 tablet (40 mg total) by mouth daily. 09/15/21   Kathie Dike, MD  carbidopa-levodopa (SINEMET IR) 25-100 MG tablet TAKE 1 TABLET BY MOUTH THREE TIMES A DAY Patient taking differently: Take 1 tablet by mouth 3 (three) times daily. 07/04/21   Narda Amber K, DO  clopidogrel (PLAVIX) 75 MG tablet Take 1 tablet (75 mg total) by mouth daily. 09/15/21   Kathie Dike, MD  ferrous sulfate 325 (65 FE) MG tablet Take 325 mg by mouth daily with breakfast.    [provider]  Lacosamide  150 MG TABS Take 1 tablet (150 mg total) by mouth 2 (two) times daily. 05/31/21   Patel, Donika K, DO  levETIRAcetam (KEPPRA) 750 MG tablet TAKE 2 TABLETS (1,500 MG TOTAL) BY MOUTH 2 (TWO) TIMES DAILY. 06/26/21   Narda Amber K, DO  levothyroxine (SYNTHROID) 75 MCG tablet TAKE 1 TABLET BY MOUTH EVERY DAY Patient taking differently: Take 75 mcg by mouth daily before breakfast. 07/04/21   Nafziger, Tommi Rumps, NP  LORazepam (ATIVAN) 1 MG tablet Take 1 tablet (1 mg total) by mouth every 6 (six) hours as needed for anxiety (dyspnea). 09/14/21   Kathie Dike, MD  Morphine Sulfate (MORPHINE CONCENTRATE) 10 MG/0.5ML SOLN concentrated solution Take 0.25 mLs (5 mg total) by mouth every 4 (four) hours as needed for moderate pain (or dyspnea). 09/14/21   Kathie Dike, MD  nitroGLYCERIN (NITROSTAT) 0.4 MG SL tablet Place 1 tablet (0.4 mg total) under the tongue every 5 (five) minutes x 3 doses as needed for chest pain. 09/14/21   Kathie Dike, MD  omeprazole (PRILOSEC) 20 MG capsule TAKE 1 CAPSULE BY MOUTH EVERY DAY Patient taking differently: Take 20 mg by mouth daily. 05/10/21   Nafziger, Tommi Rumps, NP  ondansetron (ZOFRAN) 4 MG tablet Take 1 tablet (4 mg total) by mouth every 6 (six) hours as needed for nausea. 03/24/21   Raiford Noble Latif, DO  vitamin B-12 (CYANOCOBALAMIN) 1000 MCG  tablet Take 1,000 mcg by mouth daily.    [provider]      Allergies    Patient has no known allergies.    Review of Systems   Review of Systems  All other systems reviewed and are negative.   Physical Exam Updated Vital Signs BP 138/81 (BP Location: Right Arm)   Pulse 61   Temp (!) 97.5 F (36.4 C) (Axillary)   Resp 18   SpO2 99%  Physical Exam Vitals and nursing note reviewed.  Constitutional:      General: He is not in acute distress.    Appearance: Normal appearance. He is well-developed. He is not toxic-appearing.  HENT:     Head: Normocephalic and atraumatic.  Eyes:     General: Lids are normal.      Conjunctiva/sclera: Conjunctivae normal.     Pupils: Pupils are equal, round, and reactive to light.  Neck:     Thyroid: No thyroid mass.     Trachea: No tracheal deviation.  Cardiovascular:     Rate and Rhythm: Normal rate and regular rhythm.     Heart sounds: Normal heart sounds. No murmur heard.    No gallop.  Pulmonary:     Effort: Pulmonary effort is normal. No respiratory distress.     Breath sounds: Normal breath sounds. No stridor. No decreased breath sounds, wheezing, rhonchi or rales.  Abdominal:     General: There is no distension.     Palpations: Abdomen is soft.     Tenderness: There is no abdominal tenderness. There is no rebound.  Musculoskeletal:        General: No tenderness. Normal range of motion.     Cervical back: Normal range of motion and neck supple.  Skin:    General: Skin is warm and dry.     Findings: No abrasion or rash.  Neurological:     General: No focal deficit present.     Mental Status: He is alert and oriented to person, place, and time. Mental status is at baseline.     GCS: GCS eye subscore is 4. GCS verbal subscore is 5. GCS motor subscore is 6.     Cranial Nerves: No cranial nerve deficit.     Sensory: No sensory deficit.     Motor: Motor function is intact.  Psychiatric:        Attention and Perception: Attention normal.        Speech: Speech normal.        Behavior: Behavior normal.     ED Results / Procedures / Treatments   Labs (all labs ordered are listed, but only abnormal results are displayed) Labs Reviewed  COMPREHENSIVE METABOLIC PANEL  CBC  URINALYSIS, ROUTINE W REFLEX MICROSCOPIC    EKG None  Radiology No results found.  Procedures Procedures    Medications Ordered in ED Medications - No data to display  ED Course/ Medical Decision Making/ A&P                           Medical Decision Making Amount and/or Complexity of Data Reviewed Labs: ordered. Radiology: ordered.   Pt here after mechanical  fall Considered possible head and cervical spine injury CT of both head and cervical spine negative for acute fracture per my review and interpretation Blood work without acute significant abnormality Urinalysis pending, and signed out to next provider Pt likely will be able to go home  Final Clinical Impression(s) / ED Diagnoses Final diagnoses:  None    Rx / DC Orders ED Discharge Orders     None         Lacretia Leigh, MD 03/29/22 1358

## 2022-03-26 NOTE — Progress Notes (Signed)
Orthopedic Tech Progress Note Patient Details:  Gerald Leach 1933-09-28 [id number]  Level 2 trauma   [name], male   DOB: 12/23/1933, 88 y.o.   MRN: [id number]  Gerald Leach 03/26/2022, 1:40 PM

## 2022-03-26 NOTE — ED Provider Notes (Signed)
  Physical Exam  BP (!) 166/90   Pulse 62   Temp (!) 97.5 F (36.4 C) (Axillary)   Resp 20   SpO2 92%   Physical Exam  Procedures  Procedures  ED Course / MDM    Medical Decision Making Care assumed at 3 PM.  Patient is on blood thinners and had a mechanical fall.  CT head and cervical spine unremarkable.  Labs unremarkable and signed out pending urinalysis.  6:01 PM UA unremarkable.  Patient is demented and has baseline tremors from his Parkinson's.  Stable for discharge back to facility  Problems Addressed: Fall, initial encounter: acute illness or injury  Amount and/or Complexity of Data Reviewed Labs: ordered. Decision-making details documented in ED Course. Radiology: ordered and independent interpretation performed. Decision-making details documented in ED Course.          Drenda Freeze, MD 03/26/22 [phone]

## 2022-03-28 DIAGNOSIS — M6281 Muscle weakness (generalized): Secondary | ICD-10-CM | POA: Diagnosis not present

## 2022-03-28 DIAGNOSIS — W010XXA Fall on same level from slipping, tripping and stumbling without subsequent striking against object, initial encounter: Secondary | ICD-10-CM | POA: Diagnosis not present

## 2022-03-28 DIAGNOSIS — G20C Parkinsonism, unspecified: Secondary | ICD-10-CM | POA: Diagnosis not present

## 2022-03-28 DIAGNOSIS — E039 Hypothyroidism, unspecified: Secondary | ICD-10-CM | POA: Diagnosis not present

## 2022-03-28 DIAGNOSIS — I4891 Unspecified atrial fibrillation: Secondary | ICD-10-CM | POA: Diagnosis not present

## 2022-03-28 LAB — URINE CULTURE

## 2022-03-29 DIAGNOSIS — E782 Mixed hyperlipidemia: Secondary | ICD-10-CM | POA: Diagnosis not present

## 2022-03-29 DIAGNOSIS — R296 Repeated falls: Secondary | ICD-10-CM | POA: Diagnosis not present

## 2022-03-29 DIAGNOSIS — Z7984 Long term (current) use of oral hypoglycemic drugs: Secondary | ICD-10-CM | POA: Diagnosis not present

## 2022-03-29 DIAGNOSIS — M2041 Other hammer toe(s) (acquired), right foot: Secondary | ICD-10-CM | POA: Diagnosis not present

## 2022-03-29 DIAGNOSIS — Z9181 History of falling: Secondary | ICD-10-CM | POA: Diagnosis not present

## 2022-03-29 DIAGNOSIS — Z7982 Long term (current) use of aspirin: Secondary | ICD-10-CM | POA: Diagnosis not present

## 2022-03-29 DIAGNOSIS — B351 Tinea unguium: Secondary | ICD-10-CM | POA: Diagnosis not present

## 2022-03-29 DIAGNOSIS — F17200 Nicotine dependence, unspecified, uncomplicated: Secondary | ICD-10-CM | POA: Diagnosis not present

## 2022-03-29 DIAGNOSIS — L84 Corns and callosities: Secondary | ICD-10-CM | POA: Diagnosis not present

## 2022-03-29 DIAGNOSIS — E119 Type 2 diabetes mellitus without complications: Secondary | ICD-10-CM | POA: Diagnosis not present

## 2022-03-29 DIAGNOSIS — I480 Paroxysmal atrial fibrillation: Secondary | ICD-10-CM | POA: Diagnosis not present

## 2022-03-29 DIAGNOSIS — M1990 Unspecified osteoarthritis, unspecified site: Secondary | ICD-10-CM | POA: Diagnosis not present

## 2022-03-29 DIAGNOSIS — D649 Anemia, unspecified: Secondary | ICD-10-CM | POA: Diagnosis not present

## 2022-03-29 DIAGNOSIS — Z7902 Long term (current) use of antithrombotics/antiplatelets: Secondary | ICD-10-CM | POA: Diagnosis not present

## 2022-03-29 DIAGNOSIS — G20A1 Parkinson'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 dyskinesia, without mention of fluctuations: Secondary | ICD-10-CM | POA: Diagnosis not present

## 2022-03-29 DIAGNOSIS — G20B1 Parkinson's disease with dyskinesia, without mention of fluctuations: Secondary | ICD-10-CM | POA: Diagnosis not present

## 2022-03-29 DIAGNOSIS — Z993 Dependence on wheelchair: Secondary | ICD-10-CM | POA: Diagnosis not present

## 2022-03-29 DIAGNOSIS — K219 Gastro-esophageal reflux disease without esophagitis: Secondary | ICD-10-CM | POA: Diagnosis not present

## 2022-03-29 DIAGNOSIS — E038 Other specified hypothyroidism: Secondary | ICD-10-CM | POA: Diagnosis not present

## 2022-03-29 DIAGNOSIS — E538 Deficiency of other specified B group vitamins: Secondary | ICD-10-CM | POA: Diagnosis not present

## 2022-04-02 DIAGNOSIS — E119 Type 2 diabetes mellitus without complications: Secondary | ICD-10-CM | POA: Diagnosis not present

## 2022-04-02 DIAGNOSIS — I480 Paroxysmal atrial fibrillation: Secondary | ICD-10-CM | POA: Diagnosis not present

## 2022-04-02 DIAGNOSIS — G20A1 Parkinson'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 dyskinesia, without mention of fluctuations: Secondary | ICD-10-CM | POA: Diagnosis not present

## 2022-04-02 DIAGNOSIS — F17200 Nicotine dependence, unspecified, uncomplicated: Secondary | ICD-10-CM | POA: Diagnosis not present

## 2022-04-02 DIAGNOSIS — M1990 Unspecified osteoarthritis, unspecified site: Secondary | ICD-10-CM | POA: Diagnosis not present

## 2022-04-02 DIAGNOSIS — K219 Gastro-esophageal reflux disease without esophagitis: Secondary | ICD-10-CM | POA: Diagnosis not present

## 2022-04-02 DIAGNOSIS — Z7982 Long term (current) use of aspirin: Secondary | ICD-10-CM | POA: Diagnosis not present

## 2022-04-02 DIAGNOSIS — Z7902 Long term (current) use of antithrombotics/antiplatelets: Secondary | ICD-10-CM | POA: Diagnosis not present

## 2022-04-02 DIAGNOSIS — E538 Deficiency of other specified B group vitamins: Secondary | ICD-10-CM | POA: Diagnosis not present

## 2022-04-02 DIAGNOSIS — E782 Mixed hyperlipidemia: Secondary | ICD-10-CM | POA: Diagnosis not present

## 2022-04-02 DIAGNOSIS — Z9181 History of falling: Secondary | ICD-10-CM | POA: Diagnosis not present

## 2022-04-02 DIAGNOSIS — Z7984 Long term (current) use of oral hypoglycemic drugs: Secondary | ICD-10-CM | POA: Diagnosis not present

## 2022-04-02 DIAGNOSIS — D649 Anemia, unspecified: Secondary | ICD-10-CM | POA: Diagnosis not present

## 2022-04-02 DIAGNOSIS — Z993 Dependence on wheelchair: Secondary | ICD-10-CM | POA: Diagnosis not present

## 2022-04-04 DIAGNOSIS — Z91148 Patient's other noncompliance with medication regimen for other reason: Secondary | ICD-10-CM | POA: Diagnosis not present

## 2022-04-04 DIAGNOSIS — G20B1 Parkinson's disease with dyskinesia, without mention of fluctuations: Secondary | ICD-10-CM | POA: Diagnosis not present

## 2022-04-04 DIAGNOSIS — I480 Paroxysmal atrial fibrillation: Secondary | ICD-10-CM | POA: Diagnosis not present

## 2022-04-04 DIAGNOSIS — K219 Gastro-esophageal reflux disease without esophagitis: Secondary | ICD-10-CM | POA: Diagnosis not present

## 2022-04-05 DIAGNOSIS — I2109 ST elevation (STEMI) myocardial infarction involving other coronary artery of anterior wall: Secondary | ICD-10-CM | POA: Diagnosis not present

## 2022-04-05 DIAGNOSIS — G20C Parkinsonism, unspecified: Secondary | ICD-10-CM | POA: Diagnosis not present

## 2022-04-05 DIAGNOSIS — I480 Paroxysmal atrial fibrillation: Secondary | ICD-10-CM | POA: Diagnosis not present

## 2022-04-06 DIAGNOSIS — Z9181 History of falling: Secondary | ICD-10-CM | POA: Diagnosis not present

## 2022-04-06 DIAGNOSIS — Z7902 Long term (current) use of antithrombotics/antiplatelets: Secondary | ICD-10-CM | POA: Diagnosis not present

## 2022-04-06 DIAGNOSIS — M1990 Unspecified osteoarthritis, unspecified site: Secondary | ICD-10-CM | POA: Diagnosis not present

## 2022-04-06 DIAGNOSIS — D649 Anemia, unspecified: Secondary | ICD-10-CM | POA: Diagnosis not present

## 2022-04-06 DIAGNOSIS — Z7984 Long term (current) use of oral hypoglycemic drugs: Secondary | ICD-10-CM | POA: Diagnosis not present

## 2022-04-06 DIAGNOSIS — Z7982 Long term (current) use of aspirin: Secondary | ICD-10-CM | POA: Diagnosis not present

## 2022-04-06 DIAGNOSIS — G20A1 Parkinson'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 dyskinesia, without mention of fluctuations: Secondary | ICD-10-CM | POA: Diagnosis not present

## 2022-04-06 DIAGNOSIS — E119 Type 2 diabetes mellitus without complications: Secondary | ICD-10-CM | POA: Diagnosis not present

## 2022-04-06 DIAGNOSIS — E782 Mixed hyperlipidemia: Secondary | ICD-10-CM | POA: Diagnosis not present

## 2022-04-06 DIAGNOSIS — K219 Gastro-esophageal reflux disease without esophagitis: Secondary | ICD-10-CM | POA: Diagnosis not present

## 2022-04-06 DIAGNOSIS — I480 Paroxysmal atrial fibrillation: Secondary | ICD-10-CM | POA: Diagnosis not present

## 2022-04-06 DIAGNOSIS — E538 Deficiency of other specified B group vitamins: Secondary | ICD-10-CM | POA: Diagnosis not present

## 2022-04-06 DIAGNOSIS — Z993 Dependence on wheelchair: Secondary | ICD-10-CM | POA: Diagnosis not present

## 2022-04-06 DIAGNOSIS — F17200 Nicotine dependence, unspecified, uncomplicated: Secondary | ICD-10-CM | POA: Diagnosis not present

## 2022-04-07 DIAGNOSIS — I2109 ST elevation (STEMI) myocardial infarction involving other coronary artery of anterior wall: Secondary | ICD-10-CM | POA: Diagnosis not present

## 2022-04-07 DIAGNOSIS — I480 Paroxysmal atrial fibrillation: Secondary | ICD-10-CM | POA: Diagnosis not present

## 2022-04-07 DIAGNOSIS — G20C Parkinsonism, unspecified: Secondary | ICD-10-CM | POA: Diagnosis not present

## 2022-04-09 DIAGNOSIS — E119 Type 2 diabetes mellitus without complications: Secondary | ICD-10-CM | POA: Diagnosis not present

## 2022-04-09 DIAGNOSIS — Z9181 History of falling: Secondary | ICD-10-CM | POA: Diagnosis not present

## 2022-04-09 DIAGNOSIS — Z7984 Long term (current) use of oral hypoglycemic drugs: Secondary | ICD-10-CM | POA: Diagnosis not present

## 2022-04-09 DIAGNOSIS — Z993 Dependence on wheelchair: Secondary | ICD-10-CM | POA: Diagnosis not present

## 2022-04-09 DIAGNOSIS — F17200 Nicotine dependence, unspecified, uncomplicated: Secondary | ICD-10-CM | POA: Diagnosis not present

## 2022-04-09 DIAGNOSIS — I480 Paroxysmal atrial fibrillation: Secondary | ICD-10-CM | POA: Diagnosis not present

## 2022-04-09 DIAGNOSIS — Z7902 Long term (current) use of antithrombotics/antiplatelets: Secondary | ICD-10-CM | POA: Diagnosis not present

## 2022-04-09 DIAGNOSIS — Z7982 Long term (current) use of aspirin: Secondary | ICD-10-CM | POA: Diagnosis not present

## 2022-04-09 DIAGNOSIS — G20A1 Parkinson'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 dyskinesia, without mention of fluctuations: Secondary | ICD-10-CM | POA: Diagnosis not present

## 2022-04-09 DIAGNOSIS — E782 Mixed hyperlipidemia: Secondary | ICD-10-CM | POA: Diagnosis not present

## 2022-04-09 DIAGNOSIS — M1990 Unspecified osteoarthritis, unspecified site: Secondary | ICD-10-CM | POA: Diagnosis not present

## 2022-04-09 DIAGNOSIS — K219 Gastro-esophageal reflux disease without esophagitis: Secondary | ICD-10-CM | POA: Diagnosis not present

## 2022-04-09 DIAGNOSIS — E538 Deficiency of other specified B group vitamins: Secondary | ICD-10-CM | POA: Diagnosis not present

## 2022-04-09 DIAGNOSIS — D649 Anemia, unspecified: Secondary | ICD-10-CM | POA: Diagnosis not present

## 2022-04-11 DIAGNOSIS — I480 Paroxysmal atrial fibrillation: Secondary | ICD-10-CM | POA: Diagnosis not present

## 2022-04-11 DIAGNOSIS — G20B1 Parkinson's disease with dyskinesia, without mention of fluctuations: Secondary | ICD-10-CM | POA: Diagnosis not present

## 2022-04-11 DIAGNOSIS — E038 Other specified hypothyroidism: Secondary | ICD-10-CM | POA: Diagnosis not present

## 2022-04-11 DIAGNOSIS — E039 Hypothyroidism, unspecified: Secondary | ICD-10-CM | POA: Diagnosis not present

## 2022-04-13 DIAGNOSIS — E119 Type 2 diabetes mellitus without complications: Secondary | ICD-10-CM | POA: Diagnosis not present

## 2022-04-13 DIAGNOSIS — G20A1 Parkinson'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 dyskinesia, without mention of fluctuations: Secondary | ICD-10-CM | POA: Diagnosis not present

## 2022-04-16 DIAGNOSIS — F17200 Nicotine dependence, unspecified, uncomplicated: Secondary | ICD-10-CM | POA: Diagnosis not present

## 2022-04-16 DIAGNOSIS — D649 Anemia, unspecified: Secondary | ICD-10-CM | POA: Diagnosis not present

## 2022-04-16 DIAGNOSIS — I480 Paroxysmal atrial fibrillation: Secondary | ICD-10-CM | POA: Diagnosis not present

## 2022-04-16 DIAGNOSIS — G20A1 Parkinson'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 dyskinesia, without mention of fluctuations: Secondary | ICD-10-CM | POA: Diagnosis not present

## 2022-04-16 DIAGNOSIS — Z7982 Long term (current) use of aspirin: Secondary | ICD-10-CM | POA: Diagnosis not present

## 2022-04-16 DIAGNOSIS — Z9181 History of falling: Secondary | ICD-10-CM | POA: Diagnosis not present

## 2022-04-16 DIAGNOSIS — M1990 Unspecified osteoarthritis, unspecified site: Secondary | ICD-10-CM | POA: Diagnosis not present

## 2022-04-16 DIAGNOSIS — Z993 Dependence on wheelchair: Secondary | ICD-10-CM | POA: Diagnosis not present

## 2022-04-16 DIAGNOSIS — E782 Mixed hyperlipidemia: Secondary | ICD-10-CM | POA: Diagnosis not present

## 2022-04-16 DIAGNOSIS — Z7902 Long term (current) use of antithrombotics/antiplatelets: Secondary | ICD-10-CM | POA: Diagnosis not present

## 2022-04-16 DIAGNOSIS — E119 Type 2 diabetes mellitus without complications: Secondary | ICD-10-CM | POA: Diagnosis not present

## 2022-04-16 DIAGNOSIS — K219 Gastro-esophageal reflux disease without esophagitis: Secondary | ICD-10-CM | POA: Diagnosis not present

## 2022-04-16 DIAGNOSIS — Z7984 Long term (current) use of oral hypoglycemic drugs: Secondary | ICD-10-CM | POA: Diagnosis not present

## 2022-04-16 DIAGNOSIS — E538 Deficiency of other specified B group vitamins: Secondary | ICD-10-CM | POA: Diagnosis not present

## 2022-04-17 DIAGNOSIS — K219 Gastro-esophageal reflux disease without esophagitis: Secondary | ICD-10-CM | POA: Diagnosis not present

## 2022-04-17 DIAGNOSIS — D649 Anemia, unspecified: Secondary | ICD-10-CM | POA: Diagnosis not present

## 2022-04-17 DIAGNOSIS — E782 Mixed hyperlipidemia: Secondary | ICD-10-CM | POA: Diagnosis not present

## 2022-04-17 DIAGNOSIS — Z7982 Long term (current) use of aspirin: Secondary | ICD-10-CM | POA: Diagnosis not present

## 2022-04-17 DIAGNOSIS — E119 Type 2 diabetes mellitus without complications: Secondary | ICD-10-CM | POA: Diagnosis not present

## 2022-04-17 DIAGNOSIS — E538 Deficiency of other specified B group vitamins: Secondary | ICD-10-CM | POA: Diagnosis not present

## 2022-04-17 DIAGNOSIS — F17200 Nicotine dependence, unspecified, uncomplicated: Secondary | ICD-10-CM | POA: Diagnosis not present

## 2022-04-17 DIAGNOSIS — Z7984 Long term (current) use of oral hypoglycemic drugs: Secondary | ICD-10-CM | POA: Diagnosis not present

## 2022-04-17 DIAGNOSIS — Z7902 Long term (current) use of antithrombotics/antiplatelets: Secondary | ICD-10-CM | POA: Diagnosis not present

## 2022-04-17 DIAGNOSIS — Z993 Dependence on wheelchair: Secondary | ICD-10-CM | POA: Diagnosis not present

## 2022-04-17 DIAGNOSIS — M1990 Unspecified osteoarthritis, unspecified site: Secondary | ICD-10-CM | POA: Diagnosis not present

## 2022-04-17 DIAGNOSIS — I480 Paroxysmal atrial fibrillation: Secondary | ICD-10-CM | POA: Diagnosis not present

## 2022-04-17 DIAGNOSIS — Z9181 History of falling: Secondary | ICD-10-CM | POA: Diagnosis not present

## 2022-04-17 DIAGNOSIS — G20A1 Parkinson'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 dyskinesia, without mention of fluctuations: Secondary | ICD-10-CM | POA: Diagnosis not present

## 2022-04-20 DIAGNOSIS — Z9181 History of falling: Secondary | ICD-10-CM | POA: Diagnosis not present

## 2022-04-20 DIAGNOSIS — F17200 Nicotine dependence, unspecified, uncomplicated: Secondary | ICD-10-CM | POA: Diagnosis not present

## 2022-04-20 DIAGNOSIS — M1990 Unspecified osteoarthritis, unspecified site: Secondary | ICD-10-CM | POA: Diagnosis not present

## 2022-04-20 DIAGNOSIS — K219 Gastro-esophageal reflux disease without esophagitis: Secondary | ICD-10-CM | POA: Diagnosis not present

## 2022-04-20 DIAGNOSIS — E119 Type 2 diabetes mellitus without complications: Secondary | ICD-10-CM | POA: Diagnosis not present

## 2022-04-20 DIAGNOSIS — D649 Anemia, unspecified: Secondary | ICD-10-CM | POA: Diagnosis not present

## 2022-04-20 DIAGNOSIS — Z7902 Long term (current) use of antithrombotics/antiplatelets: Secondary | ICD-10-CM | POA: Diagnosis not present

## 2022-04-20 DIAGNOSIS — Z993 Dependence on wheelchair: Secondary | ICD-10-CM | POA: Diagnosis not present

## 2022-04-20 DIAGNOSIS — E782 Mixed hyperlipidemia: Secondary | ICD-10-CM | POA: Diagnosis not present

## 2022-04-20 DIAGNOSIS — E538 Deficiency of other specified B group vitamins: Secondary | ICD-10-CM | POA: Diagnosis not present

## 2022-04-20 DIAGNOSIS — G20A1 Parkinson'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 dyskinesia, without mention of fluctuations: Secondary | ICD-10-CM | POA: Diagnosis not present

## 2022-04-20 DIAGNOSIS — Z7984 Long term (current) use of oral hypoglycemic drugs: Secondary | ICD-10-CM | POA: Diagnosis not present

## 2022-04-20 DIAGNOSIS — Z7982 Long term (current) use of aspirin: Secondary | ICD-10-CM | POA: Diagnosis not present

## 2022-04-20 DIAGNOSIS — I480 Paroxysmal atrial fibrillation: Secondary | ICD-10-CM | POA: Diagnosis not present

## 2022-04-23 DIAGNOSIS — Z993 Dependence on wheelchair: Secondary | ICD-10-CM | POA: Diagnosis not present

## 2022-04-23 DIAGNOSIS — D649 Anemia, unspecified: Secondary | ICD-10-CM | POA: Diagnosis not present

## 2022-04-23 DIAGNOSIS — Z7984 Long term (current) use of oral hypoglycemic drugs: Secondary | ICD-10-CM | POA: Diagnosis not present

## 2022-04-23 DIAGNOSIS — F17200 Nicotine dependence, unspecified, uncomplicated: Secondary | ICD-10-CM | POA: Diagnosis not present

## 2022-04-23 DIAGNOSIS — Z9181 History of falling: Secondary | ICD-10-CM | POA: Diagnosis not present

## 2022-04-23 DIAGNOSIS — E119 Type 2 diabetes mellitus without complications: Secondary | ICD-10-CM | POA: Diagnosis not present

## 2022-04-23 DIAGNOSIS — Z7982 Long term (current) use of aspirin: Secondary | ICD-10-CM | POA: Diagnosis not present

## 2022-04-23 DIAGNOSIS — M1990 Unspecified osteoarthritis, unspecified site: Secondary | ICD-10-CM | POA: Diagnosis not present

## 2022-04-23 DIAGNOSIS — G20A1 Parkinson'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 dyskinesia, without mention of fluctuations: Secondary | ICD-10-CM | POA: Diagnosis not present

## 2022-04-23 DIAGNOSIS — E782 Mixed hyperlipidemia: Secondary | ICD-10-CM | POA: Diagnosis not present

## 2022-04-23 DIAGNOSIS — K219 Gastro-esophageal reflux disease without esophagitis: Secondary | ICD-10-CM | POA: Diagnosis not present

## 2022-04-23 DIAGNOSIS — Z7902 Long term (current) use of antithrombotics/antiplatelets: Secondary | ICD-10-CM | POA: Diagnosis not present

## 2022-04-23 DIAGNOSIS — E538 Deficiency of other specified B group vitamins: Secondary | ICD-10-CM | POA: Diagnosis not present

## 2022-04-23 DIAGNOSIS — I480 Paroxysmal atrial fibrillation: Secondary | ICD-10-CM | POA: Diagnosis not present

## 2022-04-25 DIAGNOSIS — G20C Parkinsonism, unspecified: Secondary | ICD-10-CM | POA: Diagnosis not present

## 2022-04-25 DIAGNOSIS — W010XXA Fall on same level from slipping, tripping and stumbling without subsequent striking against object, initial encounter: Secondary | ICD-10-CM | POA: Diagnosis not present

## 2022-04-25 DIAGNOSIS — I480 Paroxysmal atrial fibrillation: Secondary | ICD-10-CM | POA: Diagnosis not present

## 2022-04-26 DIAGNOSIS — Z79899 Other long term (current) drug therapy: Secondary | ICD-10-CM | POA: Diagnosis not present

## 2022-04-27 DIAGNOSIS — Z9181 History of falling: Secondary | ICD-10-CM | POA: Diagnosis not present

## 2022-04-27 DIAGNOSIS — G20A1 Parkinson'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 dyskinesia, without mention of fluctuations: Secondary | ICD-10-CM | POA: Diagnosis not present

## 2022-04-27 DIAGNOSIS — F17200 Nicotine dependence, unspecified, uncomplicated: Secondary | ICD-10-CM | POA: Diagnosis not present

## 2022-04-27 DIAGNOSIS — Z993 Dependence on wheelchair: Secondary | ICD-10-CM | POA: Diagnosis not present

## 2022-04-27 DIAGNOSIS — Z7984 Long term (current) use of oral hypoglycemic drugs: Secondary | ICD-10-CM | POA: Diagnosis not present

## 2022-04-27 DIAGNOSIS — Z7982 Long term (current) use of aspirin: Secondary | ICD-10-CM | POA: Diagnosis not present

## 2022-04-27 DIAGNOSIS — D649 Anemia, unspecified: Secondary | ICD-10-CM | POA: Diagnosis not present

## 2022-04-27 DIAGNOSIS — M1990 Unspecified osteoarthritis, unspecified site: Secondary | ICD-10-CM | POA: Diagnosis not present

## 2022-04-27 DIAGNOSIS — E782 Mixed hyperlipidemia: Secondary | ICD-10-CM | POA: Diagnosis not present

## 2022-04-27 DIAGNOSIS — E119 Type 2 diabetes mellitus without complications: Secondary | ICD-10-CM | POA: Diagnosis not present

## 2022-04-27 DIAGNOSIS — I480 Paroxysmal atrial fibrillation: Secondary | ICD-10-CM | POA: Diagnosis not present

## 2022-04-27 DIAGNOSIS — E538 Deficiency of other specified B group vitamins: Secondary | ICD-10-CM | POA: Diagnosis not present

## 2022-04-27 DIAGNOSIS — Z7902 Long term (current) use of antithrombotics/antiplatelets: Secondary | ICD-10-CM | POA: Diagnosis not present

## 2022-04-27 DIAGNOSIS — K219 Gastro-esophageal reflux disease without esophagitis: Secondary | ICD-10-CM | POA: Diagnosis not present

## 2022-04-30 DIAGNOSIS — I480 Paroxysmal atrial fibrillation: Secondary | ICD-10-CM | POA: Diagnosis not present

## 2022-04-30 DIAGNOSIS — Z993 Dependence on wheelchair: Secondary | ICD-10-CM | POA: Diagnosis not present

## 2022-04-30 DIAGNOSIS — M1990 Unspecified osteoarthritis, unspecified site: Secondary | ICD-10-CM | POA: Diagnosis not present

## 2022-04-30 DIAGNOSIS — Z7982 Long term (current) use of aspirin: Secondary | ICD-10-CM | POA: Diagnosis not present

## 2022-04-30 DIAGNOSIS — Z7902 Long term (current) use of antithrombotics/antiplatelets: Secondary | ICD-10-CM | POA: Diagnosis not present

## 2022-04-30 DIAGNOSIS — Z7984 Long term (current) use of oral hypoglycemic drugs: Secondary | ICD-10-CM | POA: Diagnosis not present

## 2022-04-30 DIAGNOSIS — Z9181 History of falling: Secondary | ICD-10-CM | POA: Diagnosis not present

## 2022-04-30 DIAGNOSIS — E119 Type 2 diabetes mellitus without complications: Secondary | ICD-10-CM | POA: Diagnosis not present

## 2022-04-30 DIAGNOSIS — G20A1 Parkinson'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 dyskinesia, without mention of fluctuations: Secondary | ICD-10-CM | POA: Diagnosis not present

## 2022-04-30 DIAGNOSIS — F17200 Nicotine dependence, unspecified, uncomplicated: Secondary | ICD-10-CM | POA: Diagnosis not present

## 2022-04-30 DIAGNOSIS — K219 Gastro-esophageal reflux disease without esophagitis: Secondary | ICD-10-CM | POA: Diagnosis not present

## 2022-04-30 DIAGNOSIS — E538 Deficiency of other specified B group vitamins: Secondary | ICD-10-CM | POA: Diagnosis not present

## 2022-04-30 DIAGNOSIS — E782 Mixed hyperlipidemia: Secondary | ICD-10-CM | POA: Diagnosis not present

## 2022-04-30 DIAGNOSIS — D649 Anemia, unspecified: Secondary | ICD-10-CM | POA: Diagnosis not present

## 2022-05-04 DIAGNOSIS — Z7982 Long term (current) use of aspirin: Secondary | ICD-10-CM | POA: Diagnosis not present

## 2022-05-04 DIAGNOSIS — E782 Mixed hyperlipidemia: Secondary | ICD-10-CM | POA: Diagnosis not present

## 2022-05-04 DIAGNOSIS — K219 Gastro-esophageal reflux disease without esophagitis: Secondary | ICD-10-CM | POA: Diagnosis not present

## 2022-05-04 DIAGNOSIS — G20A1 Parkinson'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 dyskinesia, without mention of fluctuations: Secondary | ICD-10-CM | POA: Diagnosis not present

## 2022-05-04 DIAGNOSIS — Z9181 History of falling: Secondary | ICD-10-CM | POA: Diagnosis not present

## 2022-05-04 DIAGNOSIS — E119 Type 2 diabetes mellitus without complications: Secondary | ICD-10-CM | POA: Diagnosis not present

## 2022-05-04 DIAGNOSIS — D649 Anemia, unspecified: Secondary | ICD-10-CM | POA: Diagnosis not present

## 2022-05-04 DIAGNOSIS — I480 Paroxysmal atrial fibrillation: Secondary | ICD-10-CM | POA: Diagnosis not present

## 2022-05-04 DIAGNOSIS — F17200 Nicotine dependence, unspecified, uncomplicated: Secondary | ICD-10-CM | POA: Diagnosis not present

## 2022-05-04 DIAGNOSIS — Z7984 Long term (current) use of oral hypoglycemic drugs: Secondary | ICD-10-CM | POA: Diagnosis not present

## 2022-05-04 DIAGNOSIS — M1990 Unspecified osteoarthritis, unspecified site: Secondary | ICD-10-CM | POA: Diagnosis not present

## 2022-05-04 DIAGNOSIS — Z993 Dependence on wheelchair: Secondary | ICD-10-CM | POA: Diagnosis not present

## 2022-05-04 DIAGNOSIS — Z7902 Long term (current) use of antithrombotics/antiplatelets: Secondary | ICD-10-CM | POA: Diagnosis not present

## 2022-05-04 DIAGNOSIS — E538 Deficiency of other specified B group vitamins: Secondary | ICD-10-CM | POA: Diagnosis not present

## 2022-05-06 DIAGNOSIS — I480 Paroxysmal atrial fibrillation: Secondary | ICD-10-CM | POA: Diagnosis not present

## 2022-05-06 DIAGNOSIS — G20C Parkinsonism, unspecified: Secondary | ICD-10-CM | POA: Diagnosis not present

## 2022-05-06 DIAGNOSIS — I2109 ST elevation (STEMI) myocardial infarction involving other coronary artery of anterior wall: Secondary | ICD-10-CM | POA: Diagnosis not present

## 2022-05-08 DIAGNOSIS — E038 Other specified hypothyroidism: Secondary | ICD-10-CM | POA: Diagnosis not present

## 2022-05-08 DIAGNOSIS — M545 Low back pain, unspecified: Secondary | ICD-10-CM | POA: Diagnosis not present

## 2022-05-08 DIAGNOSIS — G20B1 Parkinson's disease with dyskinesia, without mention of fluctuations: Secondary | ICD-10-CM | POA: Diagnosis not present

## 2022-05-08 DIAGNOSIS — K219 Gastro-esophageal reflux disease without esophagitis: Secondary | ICD-10-CM | POA: Diagnosis not present

## 2022-05-08 DIAGNOSIS — I480 Paroxysmal atrial fibrillation: Secondary | ICD-10-CM | POA: Diagnosis not present

## 2022-05-08 DIAGNOSIS — I2109 ST elevation (STEMI) myocardial infarction involving other coronary artery of anterior wall: Secondary | ICD-10-CM | POA: Diagnosis not present

## 2022-05-08 DIAGNOSIS — G20C Parkinsonism, unspecified: Secondary | ICD-10-CM | POA: Diagnosis not present

## 2022-05-09 DIAGNOSIS — Z993 Dependence on wheelchair: Secondary | ICD-10-CM | POA: Diagnosis not present

## 2022-05-09 DIAGNOSIS — I480 Paroxysmal atrial fibrillation: Secondary | ICD-10-CM | POA: Diagnosis not present

## 2022-05-09 DIAGNOSIS — E538 Deficiency of other specified B group vitamins: Secondary | ICD-10-CM | POA: Diagnosis not present

## 2022-05-09 DIAGNOSIS — E119 Type 2 diabetes mellitus without complications: Secondary | ICD-10-CM | POA: Diagnosis not present

## 2022-05-09 DIAGNOSIS — Z7902 Long term (current) use of antithrombotics/antiplatelets: Secondary | ICD-10-CM | POA: Diagnosis not present

## 2022-05-09 DIAGNOSIS — D649 Anemia, unspecified: Secondary | ICD-10-CM | POA: Diagnosis not present

## 2022-05-09 DIAGNOSIS — Z7982 Long term (current) use of aspirin: Secondary | ICD-10-CM | POA: Diagnosis not present

## 2022-05-09 DIAGNOSIS — M1990 Unspecified osteoarthritis, unspecified site: Secondary | ICD-10-CM | POA: Diagnosis not present

## 2022-05-09 DIAGNOSIS — Z9181 History of falling: Secondary | ICD-10-CM | POA: Diagnosis not present

## 2022-05-09 DIAGNOSIS — Z7984 Long term (current) use of oral hypoglycemic drugs: Secondary | ICD-10-CM | POA: Diagnosis not present

## 2022-05-09 DIAGNOSIS — E782 Mixed hyperlipidemia: Secondary | ICD-10-CM | POA: Diagnosis not present

## 2022-05-09 DIAGNOSIS — G20A1 Parkinson'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 dyskinesia, without mention of fluctuations: Secondary | ICD-10-CM | POA: Diagnosis not present

## 2022-05-09 DIAGNOSIS — F17200 Nicotine dependence, unspecified, uncomplicated: Secondary | ICD-10-CM | POA: Diagnosis not present

## 2022-05-09 DIAGNOSIS — K219 Gastro-esophageal reflux disease without esophagitis: Secondary | ICD-10-CM | POA: Diagnosis not present

## 2022-05-14 DIAGNOSIS — M1990 Unspecified osteoarthritis, unspecified site: Secondary | ICD-10-CM | POA: Diagnosis not present

## 2022-05-14 DIAGNOSIS — Z7982 Long term (current) use of aspirin: Secondary | ICD-10-CM | POA: Diagnosis not present

## 2022-05-14 DIAGNOSIS — E538 Deficiency of other specified B group vitamins: Secondary | ICD-10-CM | POA: Diagnosis not present

## 2022-05-14 DIAGNOSIS — K219 Gastro-esophageal reflux disease without esophagitis: Secondary | ICD-10-CM | POA: Diagnosis not present

## 2022-05-14 DIAGNOSIS — Z993 Dependence on wheelchair: Secondary | ICD-10-CM | POA: Diagnosis not present

## 2022-05-14 DIAGNOSIS — G20A1 Parkinson'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 dyskinesia, without mention of fluctuations: Secondary | ICD-10-CM | POA: Diagnosis not present

## 2022-05-14 DIAGNOSIS — D649 Anemia, unspecified: Secondary | ICD-10-CM | POA: Diagnosis not present

## 2022-05-14 DIAGNOSIS — I480 Paroxysmal atrial fibrillation: Secondary | ICD-10-CM | POA: Diagnosis not present

## 2022-05-14 DIAGNOSIS — E782 Mixed hyperlipidemia: Secondary | ICD-10-CM | POA: Diagnosis not present

## 2022-05-14 DIAGNOSIS — Z9181 History of falling: Secondary | ICD-10-CM | POA: Diagnosis not present

## 2022-05-14 DIAGNOSIS — E119 Type 2 diabetes mellitus without complications: Secondary | ICD-10-CM | POA: Diagnosis not present

## 2022-05-14 DIAGNOSIS — Z7984 Long term (current) use of oral hypoglycemic drugs: Secondary | ICD-10-CM | POA: Diagnosis not present

## 2022-05-14 DIAGNOSIS — F17200 Nicotine dependence, unspecified, uncomplicated: Secondary | ICD-10-CM | POA: Diagnosis not present

## 2022-05-14 DIAGNOSIS — Z7902 Long term (current) use of antithrombotics/antiplatelets: Secondary | ICD-10-CM | POA: Diagnosis not present

## 2022-05-16 DIAGNOSIS — E038 Other specified hypothyroidism: Secondary | ICD-10-CM | POA: Diagnosis not present

## 2022-05-16 DIAGNOSIS — G20C Parkinsonism, unspecified: Secondary | ICD-10-CM | POA: Diagnosis not present

## 2022-05-16 DIAGNOSIS — W010XXA Fall on same level from slipping, tripping and stumbling without subsequent striking against object, initial encounter: Secondary | ICD-10-CM | POA: Diagnosis not present

## 2022-05-16 DIAGNOSIS — I480 Paroxysmal atrial fibrillation: Secondary | ICD-10-CM | POA: Diagnosis not present

## 2022-05-21 ENCOUNTER — Non-Acute Institutional Stay: Payer: Medicare Other | Admitting: Hospice

## 2022-05-21 DIAGNOSIS — Z515 Encounter for palliative care: Secondary | ICD-10-CM

## 2022-05-21 DIAGNOSIS — E119 Type 2 diabetes mellitus without complications: Secondary | ICD-10-CM

## 2022-05-21 DIAGNOSIS — G20B1 Parkinson's disease with dyskinesia, without mention of fluctuations: Secondary | ICD-10-CM

## 2022-05-21 DIAGNOSIS — G40909 Epilepsy, unspecified, not intractable, without status epilepticus: Secondary | ICD-10-CM

## 2022-05-21 DIAGNOSIS — F0280 Dementia in other diseases classified elsewhere without behavioral disturbance: Secondary | ICD-10-CM

## 2022-05-21 DIAGNOSIS — G20A1 Parkinson'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 dyskinesia, without mention of fluctuations: Secondary | ICD-10-CM | POA: Diagnosis not present
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
## 2022-05-23 DIAGNOSIS — Z9181 History of falling: Secondary | ICD-10-CM | POA: Diagnosis not present

## 2022-05-23 DIAGNOSIS — D649 Anemia, unspecified: Secondary | ICD-10-CM | POA: Diagnosis not present

## 2022-05-23 DIAGNOSIS — M1990 Unspecified osteoarthritis, unspecified site: Secondary | ICD-10-CM | POA: Diagnosis not present

## 2022-05-23 DIAGNOSIS — G20A1 Parkinson'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 dyskinesia, without mention of fluctuations: Secondary | ICD-10-CM | POA: Diagnosis not present

## 2022-05-23 DIAGNOSIS — F17200 Nicotine dependence, unspecified, uncomplicated: Secondary | ICD-10-CM | POA: Diagnosis not present

## 2022-05-23 DIAGNOSIS — G20C Parkinsonism, unspecified: Secondary | ICD-10-CM | POA: Diagnosis not present

## 2022-05-23 DIAGNOSIS — I480 Paroxysmal atrial fibrillation: Secondary | ICD-10-CM | POA: Diagnosis not present

## 2022-05-23 DIAGNOSIS — E039 Hypothyroidism, unspecified: Secondary | ICD-10-CM | POA: Diagnosis not present

## 2022-05-23 DIAGNOSIS — W010XXA Fall on same level from slipping, tripping and stumbling without subsequent striking against object, initial encounter: Secondary | ICD-10-CM | POA: Diagnosis not present

## 2022-05-23 DIAGNOSIS — E782 Mixed hyperlipidemia: Secondary | ICD-10-CM | POA: Diagnosis not present

## 2022-05-23 DIAGNOSIS — Z993 Dependence on wheelchair: Secondary | ICD-10-CM | POA: Diagnosis not present

## 2022-05-23 DIAGNOSIS — E538 Deficiency of other specified B group vitamins: Secondary | ICD-10-CM | POA: Diagnosis not present

## 2022-05-23 DIAGNOSIS — Z7984 Long term (current) use of oral hypoglycemic drugs: Secondary | ICD-10-CM | POA: Diagnosis not present

## 2022-05-23 DIAGNOSIS — K219 Gastro-esophageal reflux disease without esophagitis: Secondary | ICD-10-CM | POA: Diagnosis not present

## 2022-05-23 DIAGNOSIS — Z7982 Long term (current) use of aspirin: Secondary | ICD-10-CM | POA: Diagnosis not present

## 2022-05-23 DIAGNOSIS — Z7902 Long term (current) use of antithrombotics/antiplatelets: Secondary | ICD-10-CM | POA: Diagnosis not present

## 2022-05-23 DIAGNOSIS — E119 Type 2 diabetes mellitus without complications: Secondary | ICD-10-CM | POA: Diagnosis not present

## 2022-05-30 DIAGNOSIS — Z9181 History of falling: Secondary | ICD-10-CM | POA: Diagnosis not present

## 2022-05-30 DIAGNOSIS — G20A1 Parkinson'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 dyskinesia, without mention of fluctuations: Secondary | ICD-10-CM | POA: Diagnosis not present

## 2022-05-30 DIAGNOSIS — E782 Mixed hyperlipidemia: Secondary | ICD-10-CM | POA: Diagnosis not present

## 2022-05-30 DIAGNOSIS — I480 Paroxysmal atrial fibrillation: Secondary | ICD-10-CM | POA: Diagnosis not present

## 2022-05-30 DIAGNOSIS — W010XXA Fall on same level from slipping, tripping and stumbling without subsequent striking against object, initial encounter: Secondary | ICD-10-CM | POA: Diagnosis not present

## 2022-05-30 DIAGNOSIS — Z7982 Long term (current) use of aspirin: Secondary | ICD-10-CM | POA: Diagnosis not present

## 2022-05-30 DIAGNOSIS — Z993 Dependence on wheelchair: Secondary | ICD-10-CM | POA: Diagnosis not present

## 2022-05-30 DIAGNOSIS — F17200 Nicotine dependence, unspecified, uncomplicated: Secondary | ICD-10-CM | POA: Diagnosis not present

## 2022-05-30 DIAGNOSIS — E538 Deficiency of other specified B group vitamins: Secondary | ICD-10-CM | POA: Diagnosis not present

## 2022-05-30 DIAGNOSIS — E119 Type 2 diabetes mellitus without complications: Secondary | ICD-10-CM | POA: Diagnosis not present

## 2022-05-30 DIAGNOSIS — G20C Parkinsonism, unspecified: Secondary | ICD-10-CM | POA: Diagnosis not present

## 2022-05-30 DIAGNOSIS — K219 Gastro-esophageal reflux disease without esophagitis: Secondary | ICD-10-CM | POA: Diagnosis not present

## 2022-05-30 DIAGNOSIS — Z7984 Long term (current) use of oral hypoglycemic drugs: Secondary | ICD-10-CM | POA: Diagnosis not present

## 2022-05-30 DIAGNOSIS — D649 Anemia, unspecified: Secondary | ICD-10-CM | POA: Diagnosis not present

## 2022-05-30 DIAGNOSIS — M1990 Unspecified osteoarthritis, unspecified site: Secondary | ICD-10-CM | POA: Diagnosis not present

## 2022-05-30 DIAGNOSIS — Z7902 Long term (current) use of antithrombotics/antiplatelets: Secondary | ICD-10-CM | POA: Diagnosis not present

## 2022-06-01 DIAGNOSIS — M1990 Unspecified osteoarthritis, unspecified site: Secondary | ICD-10-CM | POA: Diagnosis not present

## 2022-06-01 DIAGNOSIS — Z7984 Long term (current) use of oral hypoglycemic drugs: Secondary | ICD-10-CM | POA: Diagnosis not present

## 2022-06-01 DIAGNOSIS — Z7902 Long term (current) use of antithrombotics/antiplatelets: Secondary | ICD-10-CM | POA: Diagnosis not present

## 2022-06-01 DIAGNOSIS — K219 Gastro-esophageal reflux disease without esophagitis: Secondary | ICD-10-CM | POA: Diagnosis not present

## 2022-06-01 DIAGNOSIS — E538 Deficiency of other specified B group vitamins: Secondary | ICD-10-CM | POA: Diagnosis not present

## 2022-06-01 DIAGNOSIS — G20A1 Parkinson'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 dyskinesia, without mention of fluctuations: Secondary | ICD-10-CM | POA: Diagnosis not present

## 2022-06-01 DIAGNOSIS — Z7982 Long term (current) use of aspirin: Secondary | ICD-10-CM | POA: Diagnosis not present

## 2022-06-01 DIAGNOSIS — D649 Anemia, unspecified: Secondary | ICD-10-CM | POA: Diagnosis not present

## 2022-06-01 DIAGNOSIS — I480 Paroxysmal atrial fibrillation: Secondary | ICD-10-CM | POA: Diagnosis not present

## 2022-06-01 DIAGNOSIS — F17200 Nicotine dependence, unspecified, uncomplicated: Secondary | ICD-10-CM | POA: Diagnosis not present

## 2022-06-01 DIAGNOSIS — Z9181 History of falling: Secondary | ICD-10-CM | POA: Diagnosis not present

## 2022-06-01 DIAGNOSIS — E119 Type 2 diabetes mellitus without complications: Secondary | ICD-10-CM | POA: Diagnosis not present

## 2022-06-01 DIAGNOSIS — Z993 Dependence on wheelchair: Secondary | ICD-10-CM | POA: Diagnosis not present

## 2022-06-01 DIAGNOSIS — E782 Mixed hyperlipidemia: Secondary | ICD-10-CM | POA: Diagnosis not present

## 2022-06-05 DIAGNOSIS — I480 Paroxysmal atrial fibrillation: Secondary | ICD-10-CM | POA: Diagnosis not present

## 2022-06-05 DIAGNOSIS — Z7984 Long term (current) use of oral hypoglycemic drugs: Secondary | ICD-10-CM | POA: Diagnosis not present

## 2022-06-05 DIAGNOSIS — M1990 Unspecified osteoarthritis, unspecified site: Secondary | ICD-10-CM | POA: Diagnosis not present

## 2022-06-05 DIAGNOSIS — Z7902 Long term (current) use of antithrombotics/antiplatelets: Secondary | ICD-10-CM | POA: Diagnosis not present

## 2022-06-05 DIAGNOSIS — Z7982 Long term (current) use of aspirin: Secondary | ICD-10-CM | POA: Diagnosis not present

## 2022-06-05 DIAGNOSIS — I2109 ST elevation (STEMI) myocardial infarction involving other coronary artery of anterior wall: Secondary | ICD-10-CM | POA: Diagnosis not present

## 2022-06-05 DIAGNOSIS — F17200 Nicotine dependence, unspecified, uncomplicated: Secondary | ICD-10-CM | POA: Diagnosis not present

## 2022-06-05 DIAGNOSIS — Z9181 History of falling: Secondary | ICD-10-CM | POA: Diagnosis not present

## 2022-06-05 DIAGNOSIS — D649 Anemia, unspecified: Secondary | ICD-10-CM | POA: Diagnosis not present

## 2022-06-05 DIAGNOSIS — E782 Mixed hyperlipidemia: Secondary | ICD-10-CM | POA: Diagnosis not present

## 2022-06-05 DIAGNOSIS — G20C Parkinsonism, unspecified: Secondary | ICD-10-CM | POA: Diagnosis not present

## 2022-06-05 DIAGNOSIS — E538 Deficiency of other specified B group vitamins: Secondary | ICD-10-CM | POA: Diagnosis not present

## 2022-06-05 DIAGNOSIS — Z993 Dependence on wheelchair: Secondary | ICD-10-CM | POA: Diagnosis not present

## 2022-06-05 DIAGNOSIS — K219 Gastro-esophageal reflux disease without esophagitis: Secondary | ICD-10-CM | POA: Diagnosis not present

## 2022-06-05 DIAGNOSIS — G20A1 Parkinson'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 dyskinesia, without mention of fluctuations: Secondary | ICD-10-CM | POA: Diagnosis not present

## 2022-06-05 DIAGNOSIS — E119 Type 2 diabetes mellitus without complications: Secondary | ICD-10-CM | POA: Diagnosis not present

## 2022-06-06 DIAGNOSIS — G20A1 Parkinson'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 dyskinesia, without mention of fluctuations: Secondary | ICD-10-CM | POA: Diagnosis not present

## 2022-06-06 DIAGNOSIS — D649 Anemia, unspecified: Secondary | ICD-10-CM | POA: Diagnosis not present

## 2022-06-06 DIAGNOSIS — M1990 Unspecified osteoarthritis, unspecified site: Secondary | ICD-10-CM | POA: Diagnosis not present

## 2022-06-06 DIAGNOSIS — Z7984 Long term (current) use of oral hypoglycemic drugs: Secondary | ICD-10-CM | POA: Diagnosis not present

## 2022-06-06 DIAGNOSIS — F17200 Nicotine dependence, unspecified, uncomplicated: Secondary | ICD-10-CM | POA: Diagnosis not present

## 2022-06-06 DIAGNOSIS — Z9181 History of falling: Secondary | ICD-10-CM | POA: Diagnosis not present

## 2022-06-06 DIAGNOSIS — Z7982 Long term (current) use of aspirin: Secondary | ICD-10-CM | POA: Diagnosis not present

## 2022-06-06 DIAGNOSIS — K219 Gastro-esophageal reflux disease without esophagitis: Secondary | ICD-10-CM | POA: Diagnosis not present

## 2022-06-06 DIAGNOSIS — E119 Type 2 diabetes mellitus without complications: Secondary | ICD-10-CM | POA: Diagnosis not present

## 2022-06-06 DIAGNOSIS — Z993 Dependence on wheelchair: Secondary | ICD-10-CM | POA: Diagnosis not present

## 2022-06-06 DIAGNOSIS — E538 Deficiency of other specified B group vitamins: Secondary | ICD-10-CM | POA: Diagnosis not present

## 2022-06-06 DIAGNOSIS — I480 Paroxysmal atrial fibrillation: Secondary | ICD-10-CM | POA: Diagnosis not present

## 2022-06-06 DIAGNOSIS — E782 Mixed hyperlipidemia: Secondary | ICD-10-CM | POA: Diagnosis not present

## 2022-06-06 DIAGNOSIS — Z7902 Long term (current) use of antithrombotics/antiplatelets: Secondary | ICD-10-CM | POA: Diagnosis not present

## 2022-06-07 DIAGNOSIS — I2109 ST elevation (STEMI) myocardial infarction involving other coronary artery of anterior wall: Secondary | ICD-10-CM | POA: Diagnosis not present

## 2022-06-07 DIAGNOSIS — G20C Parkinsonism, unspecified: Secondary | ICD-10-CM | POA: Diagnosis not present

## 2022-06-07 DIAGNOSIS — I480 Paroxysmal atrial fibrillation: Secondary | ICD-10-CM | POA: Diagnosis not present

## 2022-06-08 DIAGNOSIS — G20A1 Parkinson'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 dyskinesia, without mention of fluctuations: Secondary | ICD-10-CM | POA: Diagnosis not present

## 2022-06-08 DIAGNOSIS — E119 Type 2 diabetes mellitus without complications: Secondary | ICD-10-CM | POA: Diagnosis not present

## 2022-06-13 DIAGNOSIS — M170 Bilateral primary osteoarthritis of knee: Secondary | ICD-10-CM | POA: Diagnosis not present

## 2022-06-13 DIAGNOSIS — G20B1 Parkinson's disease with dyskinesia, without mention of fluctuations: Secondary | ICD-10-CM | POA: Diagnosis not present

## 2022-06-13 DIAGNOSIS — E039 Hypothyroidism, unspecified: Secondary | ICD-10-CM | POA: Diagnosis not present

## 2022-06-14 DIAGNOSIS — E039 Hypothyroidism, unspecified: Secondary | ICD-10-CM | POA: Diagnosis not present

## 2022-06-15 DIAGNOSIS — Z7982 Long term (current) use of aspirin: Secondary | ICD-10-CM | POA: Diagnosis not present

## 2022-06-15 DIAGNOSIS — I480 Paroxysmal atrial fibrillation: Secondary | ICD-10-CM | POA: Diagnosis not present

## 2022-06-15 DIAGNOSIS — F17200 Nicotine dependence, unspecified, uncomplicated: Secondary | ICD-10-CM | POA: Diagnosis not present

## 2022-06-15 DIAGNOSIS — E119 Type 2 diabetes mellitus without complications: Secondary | ICD-10-CM | POA: Diagnosis not present

## 2022-06-15 DIAGNOSIS — G20A1 Parkinson'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 dyskinesia, without mention of fluctuations: Secondary | ICD-10-CM | POA: Diagnosis not present

## 2022-06-15 DIAGNOSIS — Z993 Dependence on wheelchair: Secondary | ICD-10-CM | POA: Diagnosis not present

## 2022-06-15 DIAGNOSIS — Z9181 History of falling: Secondary | ICD-10-CM | POA: Diagnosis not present

## 2022-06-15 DIAGNOSIS — E538 Deficiency of other specified B group vitamins: Secondary | ICD-10-CM | POA: Diagnosis not present

## 2022-06-15 DIAGNOSIS — Z7984 Long term (current) use of oral hypoglycemic drugs: Secondary | ICD-10-CM | POA: Diagnosis not present

## 2022-06-15 DIAGNOSIS — M1990 Unspecified osteoarthritis, unspecified site: Secondary | ICD-10-CM | POA: Diagnosis not present

## 2022-06-15 DIAGNOSIS — D649 Anemia, unspecified: Secondary | ICD-10-CM | POA: Diagnosis not present

## 2022-06-15 DIAGNOSIS — Z7902 Long term (current) use of antithrombotics/antiplatelets: Secondary | ICD-10-CM | POA: Diagnosis not present

## 2022-06-15 DIAGNOSIS — K219 Gastro-esophageal reflux disease without esophagitis: Secondary | ICD-10-CM | POA: Diagnosis not present

## 2022-06-15 DIAGNOSIS — E782 Mixed hyperlipidemia: Secondary | ICD-10-CM | POA: Diagnosis not present

## 2022-06-19 DIAGNOSIS — Z993 Dependence on wheelchair: Secondary | ICD-10-CM | POA: Diagnosis not present

## 2022-06-19 DIAGNOSIS — I480 Paroxysmal atrial fibrillation: Secondary | ICD-10-CM | POA: Diagnosis not present

## 2022-06-19 DIAGNOSIS — K219 Gastro-esophageal reflux disease without esophagitis: Secondary | ICD-10-CM | POA: Diagnosis not present

## 2022-06-19 DIAGNOSIS — Z7984 Long term (current) use of oral hypoglycemic drugs: Secondary | ICD-10-CM | POA: Diagnosis not present

## 2022-06-19 DIAGNOSIS — D649 Anemia, unspecified: Secondary | ICD-10-CM | POA: Diagnosis not present

## 2022-06-19 DIAGNOSIS — E119 Type 2 diabetes mellitus without complications: Secondary | ICD-10-CM | POA: Diagnosis not present

## 2022-06-19 DIAGNOSIS — E538 Deficiency of other specified B group vitamins: Secondary | ICD-10-CM | POA: Diagnosis not present

## 2022-06-19 DIAGNOSIS — G20A1 Parkinson'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 dyskinesia, without mention of fluctuations: Secondary | ICD-10-CM | POA: Diagnosis not present

## 2022-06-19 DIAGNOSIS — M1990 Unspecified osteoarthritis, unspecified site: Secondary | ICD-10-CM | POA: Diagnosis not present

## 2022-06-19 DIAGNOSIS — E782 Mixed hyperlipidemia: Secondary | ICD-10-CM | POA: Diagnosis not present

## 2022-06-19 DIAGNOSIS — F17200 Nicotine dependence, unspecified, uncomplicated: Secondary | ICD-10-CM | POA: Diagnosis not present

## 2022-06-19 DIAGNOSIS — Z7902 Long term (current) use of antithrombotics/antiplatelets: Secondary | ICD-10-CM | POA: Diagnosis not present

## 2022-06-19 DIAGNOSIS — Z9181 History of falling: Secondary | ICD-10-CM | POA: Diagnosis not present

## 2022-06-19 DIAGNOSIS — Z7982 Long term (current) use of aspirin: Secondary | ICD-10-CM | POA: Diagnosis not present

## 2022-06-20 DIAGNOSIS — G20A1 Parkinson'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 dyskinesia, without mention of fluctuations: Secondary | ICD-10-CM | POA: Diagnosis not present

## 2022-06-20 DIAGNOSIS — E538 Deficiency of other specified B group vitamins: Secondary | ICD-10-CM | POA: Diagnosis not present

## 2022-06-20 DIAGNOSIS — I480 Paroxysmal atrial fibrillation: Secondary | ICD-10-CM | POA: Diagnosis not present

## 2022-06-20 DIAGNOSIS — D509 Iron deficiency anemia, unspecified: Secondary | ICD-10-CM | POA: Diagnosis not present

## 2022-06-20 DIAGNOSIS — Z993 Dependence on wheelchair: Secondary | ICD-10-CM | POA: Diagnosis not present

## 2022-06-20 DIAGNOSIS — E039 Hypothyroidism, unspecified: Secondary | ICD-10-CM | POA: Diagnosis not present

## 2022-06-20 DIAGNOSIS — E119 Type 2 diabetes mellitus without complications: Secondary | ICD-10-CM | POA: Diagnosis not present

## 2022-06-20 DIAGNOSIS — Z9181 History of falling: Secondary | ICD-10-CM | POA: Diagnosis not present

## 2022-06-20 DIAGNOSIS — D649 Anemia, unspecified: Secondary | ICD-10-CM | POA: Diagnosis not present

## 2022-06-20 DIAGNOSIS — E782 Mixed hyperlipidemia: Secondary | ICD-10-CM | POA: Diagnosis not present

## 2022-06-20 DIAGNOSIS — Z7902 Long term (current) use of antithrombotics/antiplatelets: Secondary | ICD-10-CM | POA: Diagnosis not present

## 2022-06-20 DIAGNOSIS — W010XXA Fall on same level from slipping, tripping and stumbling without subsequent striking against object, initial encounter: Secondary | ICD-10-CM | POA: Diagnosis not present

## 2022-06-20 DIAGNOSIS — K219 Gastro-esophageal reflux disease without esophagitis: Secondary | ICD-10-CM | POA: Diagnosis not present

## 2022-06-20 DIAGNOSIS — M1990 Unspecified osteoarthritis, unspecified site: Secondary | ICD-10-CM | POA: Diagnosis not present

## 2022-06-20 DIAGNOSIS — Z7984 Long term (current) use of oral hypoglycemic drugs: Secondary | ICD-10-CM | POA: Diagnosis not present

## 2022-06-20 DIAGNOSIS — Z7982 Long term (current) use of aspirin: Secondary | ICD-10-CM | POA: Diagnosis not present

## 2022-06-20 DIAGNOSIS — F17200 Nicotine dependence, unspecified, uncomplicated: Secondary | ICD-10-CM | POA: Diagnosis not present

## 2022-06-25 DIAGNOSIS — E782 Mixed hyperlipidemia: Secondary | ICD-10-CM | POA: Diagnosis not present

## 2022-06-25 DIAGNOSIS — I480 Paroxysmal atrial fibrillation: Secondary | ICD-10-CM | POA: Diagnosis not present

## 2022-06-25 DIAGNOSIS — M1990 Unspecified osteoarthritis, unspecified site: Secondary | ICD-10-CM | POA: Diagnosis not present

## 2022-06-25 DIAGNOSIS — E538 Deficiency of other specified B group vitamins: Secondary | ICD-10-CM | POA: Diagnosis not present

## 2022-06-25 DIAGNOSIS — K219 Gastro-esophageal reflux disease without esophagitis: Secondary | ICD-10-CM | POA: Diagnosis not present

## 2022-06-25 DIAGNOSIS — Z993 Dependence on wheelchair: Secondary | ICD-10-CM | POA: Diagnosis not present

## 2022-06-25 DIAGNOSIS — F17200 Nicotine dependence, unspecified, uncomplicated: Secondary | ICD-10-CM | POA: Diagnosis not present

## 2022-06-25 DIAGNOSIS — Z7902 Long term (current) use of antithrombotics/antiplatelets: Secondary | ICD-10-CM | POA: Diagnosis not present

## 2022-06-25 DIAGNOSIS — D649 Anemia, unspecified: Secondary | ICD-10-CM | POA: Diagnosis not present

## 2022-06-25 DIAGNOSIS — Z9181 History of falling: Secondary | ICD-10-CM | POA: Diagnosis not present

## 2022-06-25 DIAGNOSIS — Z7984 Long term (current) use of oral hypoglycemic drugs: Secondary | ICD-10-CM | POA: Diagnosis not present

## 2022-06-25 DIAGNOSIS — Z7982 Long term (current) use of aspirin: Secondary | ICD-10-CM | POA: Diagnosis not present

## 2022-06-25 DIAGNOSIS — G20A1 Parkinson'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 dyskinesia, without mention of fluctuations: Secondary | ICD-10-CM | POA: Diagnosis not present

## 2022-06-25 DIAGNOSIS — E119 Type 2 diabetes mellitus without complications: Secondary | ICD-10-CM | POA: Diagnosis not present

## 2022-06-26 ENCOUNTER — Telehealth: Payer: Medicare Other | Admitting: Family Medicine

## 2022-06-26 ENCOUNTER — Encounter: Payer: Self-pay | Admitting: Family Medicine

## 2022-06-26 DIAGNOSIS — Z538 Procedure and treatment not carried out for other reasons: Secondary | ICD-10-CM

## 2022-06-26 NOTE — Progress Notes (Signed)
Per nurse assistant patient not available for visit as is in nursing home currently. Previsit completed with nurse/son before found this out and they plan reschedule to time when pt and son available.      PATIENT CHECK-IN and HEALTH RISK ASSESSMENT QUESTIONNAIRE:  -completed by phone/video for upcoming Medicare Preventive Visit  Pre-Visit Check-in: 1)Vitals (height, wt, BP, etc) - record in vitals section for visit on day of visit 2)Review and Update Medications, Allergies PMH, Surgeries, Social history in Epic 3)Hospitalizations in the last year with date/reason? April 2023 MI  4)Review and Update Care Team (patient's specialists) in Epic 5) Complete PHQ9 in Epic  6) Complete Fall Screening in Epic 7)Review all Health Maintenance Due and order under PCP if not done.  Medicare Wellness Patient Questionnaire:  Answer theses question about your habits: Do you drink alcohol? no If yes, how many drinks do you have a day?n/a Have you ever smoked?n/a Quit date if applicable? N/a  How many packs a day do/did you smoke? N/a Do you use smokeless tobacco?n/a Do you use an illicit drugs?n/a Do you exercises? Does PT in facility IF so, what type and how many days/minutes per week?n/a Are you sexually active? No Number of partners?n/a Typical breakfast whatever is served in facility Typical lunch whatever is served in [place] is served in facility Typical snacks: whatever is served in facility  Beverages: whatever is served in facility  Answer theses question about you: Can you perform most household chores?no Do you find it hard to follow a conversation in a noisy room?yes Do you often ask people to speak up or repeat themselves?yes Do you feel that you have a problem with memory?pt has dementia Do you balance your checkbook and or bank acounts?no Do you feel safe at home?n/a Last dentist visit?unsure Do you need assistance with any of the following: Please note  if so yes  Driving?  Feeding yourself?  Getting from bed to chair?  Getting to the toilet?  Bathing or showering?  Dressing yourself?  Managing money?  Climbing a flight of stairs  Preparing meals?    Do you have Advanced Directives in place (Living Will, Healthcare Power or Attorney)? Yes   Last eye Exam and location?unsure   Do you currently use prescribed or non-prescribed narcotic or opioid pain medications?no  Do you have a history or close family history of breast, ovarian, tubal or peritoneal cancer or a family member with BRCA (breast cancer susceptibility 1 and 2) gene mutations?  unsure Nurse/Assistant Credentials/time stamp:  Cyd Silence, CMA ----------------------------------------------------------------------------------------------------------------------------------------------------------------------------------------------------------------------

## 2022-06-27 DIAGNOSIS — W010XXA Fall on same level from slipping, tripping and stumbling without subsequent striking against object, initial encounter: Secondary | ICD-10-CM | POA: Diagnosis not present

## 2022-06-27 DIAGNOSIS — G20B1 Parkinson's disease with dyskinesia, without mention of fluctuations: Secondary | ICD-10-CM | POA: Diagnosis not present

## 2022-06-28 DIAGNOSIS — Z993 Dependence on wheelchair: Secondary | ICD-10-CM | POA: Diagnosis not present

## 2022-06-28 DIAGNOSIS — D649 Anemia, unspecified: Secondary | ICD-10-CM | POA: Diagnosis not present

## 2022-06-28 DIAGNOSIS — F17200 Nicotine dependence, unspecified, uncomplicated: Secondary | ICD-10-CM | POA: Diagnosis not present

## 2022-06-28 DIAGNOSIS — Z7902 Long term (current) use of antithrombotics/antiplatelets: Secondary | ICD-10-CM | POA: Diagnosis not present

## 2022-06-28 DIAGNOSIS — G20A1 Parkinson'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 dyskinesia, without mention of fluctuations: Secondary | ICD-10-CM | POA: Diagnosis not present

## 2022-06-28 DIAGNOSIS — Z7984 Long term (current) use of oral hypoglycemic drugs: Secondary | ICD-10-CM | POA: Diagnosis not present

## 2022-06-28 DIAGNOSIS — E782 Mixed hyperlipidemia: Secondary | ICD-10-CM | POA: Diagnosis not present

## 2022-06-28 DIAGNOSIS — K219 Gastro-esophageal reflux disease without esophagitis: Secondary | ICD-10-CM | POA: Diagnosis not present

## 2022-06-28 DIAGNOSIS — M1990 Unspecified osteoarthritis, unspecified site: Secondary | ICD-10-CM | POA: Diagnosis not present

## 2022-06-28 DIAGNOSIS — E119 Type 2 diabetes mellitus without complications: Secondary | ICD-10-CM | POA: Diagnosis not present

## 2022-06-28 DIAGNOSIS — E538 Deficiency of other specified B group vitamins: Secondary | ICD-10-CM | POA: Diagnosis not present

## 2022-06-28 DIAGNOSIS — Z7982 Long term (current) use of aspirin: Secondary | ICD-10-CM | POA: Diagnosis not present

## 2022-06-28 DIAGNOSIS — I480 Paroxysmal atrial fibrillation: Secondary | ICD-10-CM | POA: Diagnosis not present

## 2022-06-28 DIAGNOSIS — Z9181 History of falling: Secondary | ICD-10-CM | POA: Diagnosis not present

## 2022-07-03 DIAGNOSIS — F17200 Nicotine dependence, unspecified, uncomplicated: Secondary | ICD-10-CM | POA: Diagnosis not present

## 2022-07-03 DIAGNOSIS — E782 Mixed hyperlipidemia: Secondary | ICD-10-CM | POA: Diagnosis not present

## 2022-07-03 DIAGNOSIS — I480 Paroxysmal atrial fibrillation: Secondary | ICD-10-CM | POA: Diagnosis not present

## 2022-07-03 DIAGNOSIS — Z7902 Long term (current) use of antithrombotics/antiplatelets: Secondary | ICD-10-CM | POA: Diagnosis not present

## 2022-07-03 DIAGNOSIS — Z993 Dependence on wheelchair: Secondary | ICD-10-CM | POA: Diagnosis not present

## 2022-07-03 DIAGNOSIS — M1990 Unspecified osteoarthritis, unspecified site: Secondary | ICD-10-CM | POA: Diagnosis not present

## 2022-07-03 DIAGNOSIS — Z9181 History of falling: Secondary | ICD-10-CM | POA: Diagnosis not present

## 2022-07-03 DIAGNOSIS — K219 Gastro-esophageal reflux disease without esophagitis: Secondary | ICD-10-CM | POA: Diagnosis not present

## 2022-07-03 DIAGNOSIS — Z7984 Long term (current) use of oral hypoglycemic drugs: Secondary | ICD-10-CM | POA: Diagnosis not present

## 2022-07-03 DIAGNOSIS — G20A1 Parkinson'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 dyskinesia, without mention of fluctuations: Secondary | ICD-10-CM | POA: Diagnosis not present

## 2022-07-03 DIAGNOSIS — E538 Deficiency of other specified B group vitamins: Secondary | ICD-10-CM | POA: Diagnosis not present

## 2022-07-03 DIAGNOSIS — Z7982 Long term (current) use of aspirin: Secondary | ICD-10-CM | POA: Diagnosis not present

## 2022-07-03 DIAGNOSIS — E119 Type 2 diabetes mellitus without complications: Secondary | ICD-10-CM | POA: Diagnosis not present

## 2022-07-03 DIAGNOSIS — D649 Anemia, unspecified: Secondary | ICD-10-CM | POA: Diagnosis not present

## 2022-07-06 DIAGNOSIS — E782 Mixed hyperlipidemia: Secondary | ICD-10-CM | POA: Diagnosis not present

## 2022-07-06 DIAGNOSIS — K219 Gastro-esophageal reflux disease without esophagitis: Secondary | ICD-10-CM | POA: Diagnosis not present

## 2022-07-06 DIAGNOSIS — G20A1 Parkinson'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 dyskinesia, without mention of fluctuations: Secondary | ICD-10-CM | POA: Diagnosis not present

## 2022-07-06 DIAGNOSIS — G20C Parkinsonism, unspecified: Secondary | ICD-10-CM | POA: Diagnosis not present

## 2022-07-06 DIAGNOSIS — Z993 Dependence on wheelchair: Secondary | ICD-10-CM | POA: Diagnosis not present

## 2022-07-06 DIAGNOSIS — E119 Type 2 diabetes mellitus without complications: Secondary | ICD-10-CM | POA: Diagnosis not present

## 2022-07-06 DIAGNOSIS — Z7982 Long term (current) use of aspirin: Secondary | ICD-10-CM | POA: Diagnosis not present

## 2022-07-06 DIAGNOSIS — I480 Paroxysmal atrial fibrillation: Secondary | ICD-10-CM | POA: Diagnosis not present

## 2022-07-06 DIAGNOSIS — M1990 Unspecified osteoarthritis, unspecified site: Secondary | ICD-10-CM | POA: Diagnosis not present

## 2022-07-06 DIAGNOSIS — F17200 Nicotine dependence, unspecified, uncomplicated: Secondary | ICD-10-CM | POA: Diagnosis not present

## 2022-07-06 DIAGNOSIS — I2109 ST elevation (STEMI) myocardial infarction involving other coronary artery of anterior wall: Secondary | ICD-10-CM | POA: Diagnosis not present

## 2022-07-06 DIAGNOSIS — D649 Anemia, unspecified: Secondary | ICD-10-CM | POA: Diagnosis not present

## 2022-07-06 DIAGNOSIS — Z7902 Long term (current) use of antithrombotics/antiplatelets: Secondary | ICD-10-CM | POA: Diagnosis not present

## 2022-07-06 DIAGNOSIS — Z9181 History of falling: Secondary | ICD-10-CM | POA: Diagnosis not present

## 2022-07-06 DIAGNOSIS — Z7984 Long term (current) use of oral hypoglycemic drugs: Secondary | ICD-10-CM | POA: Diagnosis not present

## 2022-07-06 DIAGNOSIS — E538 Deficiency of other specified B group vitamins: Secondary | ICD-10-CM | POA: Diagnosis not present

## 2022-07-08 DIAGNOSIS — G20C Parkinsonism, unspecified: Secondary | ICD-10-CM | POA: Diagnosis not present

## 2022-07-08 DIAGNOSIS — I2109 ST elevation (STEMI) myocardial infarction involving other coronary artery of anterior wall: Secondary | ICD-10-CM | POA: Diagnosis not present

## 2022-07-08 DIAGNOSIS — I480 Paroxysmal atrial fibrillation: Secondary | ICD-10-CM | POA: Diagnosis not present

## 2022-07-10 DIAGNOSIS — K219 Gastro-esophageal reflux disease without esophagitis: Secondary | ICD-10-CM | POA: Diagnosis not present

## 2022-07-10 DIAGNOSIS — F17200 Nicotine dependence, unspecified, uncomplicated: Secondary | ICD-10-CM | POA: Diagnosis not present

## 2022-07-10 DIAGNOSIS — I480 Paroxysmal atrial fibrillation: Secondary | ICD-10-CM | POA: Diagnosis not present

## 2022-07-10 DIAGNOSIS — G20A1 Parkinson'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 dyskinesia, without mention of fluctuations: Secondary | ICD-10-CM | POA: Diagnosis not present

## 2022-07-10 DIAGNOSIS — Z993 Dependence on wheelchair: Secondary | ICD-10-CM | POA: Diagnosis not present

## 2022-07-10 DIAGNOSIS — D649 Anemia, unspecified: Secondary | ICD-10-CM | POA: Diagnosis not present

## 2022-07-10 DIAGNOSIS — E538 Deficiency of other specified B group vitamins: Secondary | ICD-10-CM | POA: Diagnosis not present

## 2022-07-10 DIAGNOSIS — Z7902 Long term (current) use of antithrombotics/antiplatelets: Secondary | ICD-10-CM | POA: Diagnosis not present

## 2022-07-10 DIAGNOSIS — E119 Type 2 diabetes mellitus without complications: Secondary | ICD-10-CM | POA: Diagnosis not present

## 2022-07-10 DIAGNOSIS — Z7984 Long term (current) use of oral hypoglycemic drugs: Secondary | ICD-10-CM | POA: Diagnosis not present

## 2022-07-10 DIAGNOSIS — L84 Corns and callosities: Secondary | ICD-10-CM | POA: Diagnosis not present

## 2022-07-10 DIAGNOSIS — M1990 Unspecified osteoarthritis, unspecified site: Secondary | ICD-10-CM | POA: Diagnosis not present

## 2022-07-10 DIAGNOSIS — E782 Mixed hyperlipidemia: Secondary | ICD-10-CM | POA: Diagnosis not present

## 2022-07-10 DIAGNOSIS — Z7982 Long term (current) use of aspirin: Secondary | ICD-10-CM | POA: Diagnosis not present

## 2022-07-10 DIAGNOSIS — Z9181 History of falling: Secondary | ICD-10-CM | POA: Diagnosis not present

## 2022-07-10 DIAGNOSIS — B351 Tinea unguium: Secondary | ICD-10-CM | POA: Diagnosis not present

## 2022-07-11 DIAGNOSIS — G20B1 Parkinson's disease with dyskinesia, without mention of fluctuations: Secondary | ICD-10-CM | POA: Diagnosis not present

## 2022-07-11 DIAGNOSIS — W010XXA Fall on same level from slipping, tripping and stumbling without subsequent striking against object, initial encounter: Secondary | ICD-10-CM | POA: Diagnosis not present

## 2022-07-13 DIAGNOSIS — E119 Type 2 diabetes mellitus without complications: Secondary | ICD-10-CM | POA: Diagnosis not present

## 2022-07-13 DIAGNOSIS — K219 Gastro-esophageal reflux disease without esophagitis: Secondary | ICD-10-CM | POA: Diagnosis not present

## 2022-07-13 DIAGNOSIS — Z9181 History of falling: Secondary | ICD-10-CM | POA: Diagnosis not present

## 2022-07-13 DIAGNOSIS — I480 Paroxysmal atrial fibrillation: Secondary | ICD-10-CM | POA: Diagnosis not present

## 2022-07-13 DIAGNOSIS — E782 Mixed hyperlipidemia: Secondary | ICD-10-CM | POA: Diagnosis not present

## 2022-07-13 DIAGNOSIS — G20A1 Parkinson'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 dyskinesia, without mention of fluctuations: Secondary | ICD-10-CM | POA: Diagnosis not present

## 2022-07-13 DIAGNOSIS — M1990 Unspecified osteoarthritis, unspecified site: Secondary | ICD-10-CM | POA: Diagnosis not present

## 2022-07-13 DIAGNOSIS — Z7982 Long term (current) use of aspirin: Secondary | ICD-10-CM | POA: Diagnosis not present

## 2022-07-13 DIAGNOSIS — E538 Deficiency of other specified B group vitamins: Secondary | ICD-10-CM | POA: Diagnosis not present

## 2022-07-13 DIAGNOSIS — Z993 Dependence on wheelchair: Secondary | ICD-10-CM | POA: Diagnosis not present

## 2022-07-13 DIAGNOSIS — D649 Anemia, unspecified: Secondary | ICD-10-CM | POA: Diagnosis not present

## 2022-07-13 DIAGNOSIS — Z7984 Long term (current) use of oral hypoglycemic drugs: Secondary | ICD-10-CM | POA: Diagnosis not present

## 2022-07-13 DIAGNOSIS — F17200 Nicotine dependence, unspecified, uncomplicated: Secondary | ICD-10-CM | POA: Diagnosis not present

## 2022-07-13 DIAGNOSIS — Z7902 Long term (current) use of antithrombotics/antiplatelets: Secondary | ICD-10-CM | POA: Diagnosis not present

## 2022-07-16 DIAGNOSIS — Z7984 Long term (current) use of oral hypoglycemic drugs: Secondary | ICD-10-CM | POA: Diagnosis not present

## 2022-07-16 DIAGNOSIS — G20A1 Parkinson'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 dyskinesia, without mention of fluctuations: Secondary | ICD-10-CM | POA: Diagnosis not present

## 2022-07-16 DIAGNOSIS — Z7982 Long term (current) use of aspirin: Secondary | ICD-10-CM | POA: Diagnosis not present

## 2022-07-16 DIAGNOSIS — E538 Deficiency of other specified B group vitamins: Secondary | ICD-10-CM | POA: Diagnosis not present

## 2022-07-16 DIAGNOSIS — F17200 Nicotine dependence, unspecified, uncomplicated: Secondary | ICD-10-CM | POA: Diagnosis not present

## 2022-07-16 DIAGNOSIS — E782 Mixed hyperlipidemia: Secondary | ICD-10-CM | POA: Diagnosis not present

## 2022-07-16 DIAGNOSIS — I480 Paroxysmal atrial fibrillation: Secondary | ICD-10-CM | POA: Diagnosis not present

## 2022-07-16 DIAGNOSIS — Z9181 History of falling: Secondary | ICD-10-CM | POA: Diagnosis not present

## 2022-07-16 DIAGNOSIS — E119 Type 2 diabetes mellitus without complications: Secondary | ICD-10-CM | POA: Diagnosis not present

## 2022-07-16 DIAGNOSIS — K219 Gastro-esophageal reflux disease without esophagitis: Secondary | ICD-10-CM | POA: Diagnosis not present

## 2022-07-16 DIAGNOSIS — Z993 Dependence on wheelchair: Secondary | ICD-10-CM | POA: Diagnosis not present

## 2022-07-16 DIAGNOSIS — Z7902 Long term (current) use of antithrombotics/antiplatelets: Secondary | ICD-10-CM | POA: Diagnosis not present

## 2022-07-16 DIAGNOSIS — D649 Anemia, unspecified: Secondary | ICD-10-CM | POA: Diagnosis not present

## 2022-07-16 DIAGNOSIS — M1990 Unspecified osteoarthritis, unspecified site: Secondary | ICD-10-CM | POA: Diagnosis not present

## 2022-07-17 DIAGNOSIS — K219 Gastro-esophageal reflux disease without esophagitis: Secondary | ICD-10-CM | POA: Diagnosis not present

## 2022-07-17 DIAGNOSIS — E782 Mixed hyperlipidemia: Secondary | ICD-10-CM | POA: Diagnosis not present

## 2022-07-17 DIAGNOSIS — Z7902 Long term (current) use of antithrombotics/antiplatelets: Secondary | ICD-10-CM | POA: Diagnosis not present

## 2022-07-17 DIAGNOSIS — G20A1 Parkinson'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 dyskinesia, without mention of fluctuations: Secondary | ICD-10-CM | POA: Diagnosis not present

## 2022-07-17 DIAGNOSIS — E119 Type 2 diabetes mellitus without complications: Secondary | ICD-10-CM | POA: Diagnosis not present

## 2022-07-17 DIAGNOSIS — I480 Paroxysmal atrial fibrillation: Secondary | ICD-10-CM | POA: Diagnosis not present

## 2022-07-17 DIAGNOSIS — Z993 Dependence on wheelchair: Secondary | ICD-10-CM | POA: Diagnosis not present

## 2022-07-17 DIAGNOSIS — Z7984 Long term (current) use of oral hypoglycemic drugs: Secondary | ICD-10-CM | POA: Diagnosis not present

## 2022-07-17 DIAGNOSIS — Z9181 History of falling: Secondary | ICD-10-CM | POA: Diagnosis not present

## 2022-07-17 DIAGNOSIS — D649 Anemia, unspecified: Secondary | ICD-10-CM | POA: Diagnosis not present

## 2022-07-17 DIAGNOSIS — F17200 Nicotine dependence, unspecified, uncomplicated: Secondary | ICD-10-CM | POA: Diagnosis not present

## 2022-07-17 DIAGNOSIS — Z7982 Long term (current) use of aspirin: Secondary | ICD-10-CM | POA: Diagnosis not present

## 2022-07-17 DIAGNOSIS — M1990 Unspecified osteoarthritis, unspecified site: Secondary | ICD-10-CM | POA: Diagnosis not present

## 2022-07-17 DIAGNOSIS — E538 Deficiency of other specified B group vitamins: Secondary | ICD-10-CM | POA: Diagnosis not present

## 2022-07-18 ENCOUNTER — Non-Acute Institutional Stay: Payer: Medicare Other | Admitting: Hospice

## 2022-07-18 DIAGNOSIS — E039 Hypothyroidism, unspecified: Secondary | ICD-10-CM | POA: Diagnosis not present

## 2022-07-18 DIAGNOSIS — G40909 Epilepsy, unspecified, not intractable, without status epilepticus: Secondary | ICD-10-CM

## 2022-07-18 DIAGNOSIS — E119 Type 2 diabetes mellitus without complications: Secondary | ICD-10-CM

## 2022-07-18 DIAGNOSIS — Z515 Encounter for palliative care: Secondary | ICD-10-CM | POA: Diagnosis not present

## 2022-07-18 DIAGNOSIS — K219 Gastro-esophageal reflux disease without esophagitis: Secondary | ICD-10-CM | POA: Diagnosis not present

## 2022-07-18 DIAGNOSIS — W19XXXD Unspecified fall, subsequent encounter: Secondary | ICD-10-CM

## 2022-07-18 DIAGNOSIS — G20A1 Parkinson'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 dyskinesia, without mention of fluctuations: Secondary | ICD-10-CM

## 2022-07-18 DIAGNOSIS — G20B1 Parkinson's disease with dyskinesia, without mention of fluctuations: Secondary | ICD-10-CM
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
## 2022-07-19 DIAGNOSIS — E039 Hypothyroidism, unspecified: Secondary | ICD-10-CM | POA: Diagnosis not present

## 2022-07-20 DIAGNOSIS — Z7902 Long term (current) use of antithrombotics/antiplatelets: Secondary | ICD-10-CM | POA: Diagnosis not present

## 2022-07-20 DIAGNOSIS — I480 Paroxysmal atrial fibrillation: Secondary | ICD-10-CM | POA: Diagnosis not present

## 2022-07-20 DIAGNOSIS — Z9181 History of falling: Secondary | ICD-10-CM | POA: Diagnosis not present

## 2022-07-20 DIAGNOSIS — E119 Type 2 diabetes mellitus without complications: Secondary | ICD-10-CM | POA: Diagnosis not present

## 2022-07-20 DIAGNOSIS — Z993 Dependence on wheelchair: Secondary | ICD-10-CM | POA: Diagnosis not present

## 2022-07-20 DIAGNOSIS — Z7982 Long term (current) use of aspirin: Secondary | ICD-10-CM | POA: Diagnosis not present

## 2022-07-20 DIAGNOSIS — K219 Gastro-esophageal reflux disease without esophagitis: Secondary | ICD-10-CM | POA: Diagnosis not present

## 2022-07-20 DIAGNOSIS — R1310 Dysphagia, unspecified: Secondary | ICD-10-CM | POA: Diagnosis not present

## 2022-07-20 DIAGNOSIS — E538 Deficiency of other specified B group vitamins: Secondary | ICD-10-CM | POA: Diagnosis not present

## 2022-07-20 DIAGNOSIS — E782 Mixed hyperlipidemia: Secondary | ICD-10-CM | POA: Diagnosis not present

## 2022-07-20 DIAGNOSIS — Z7984 Long term (current) use of oral hypoglycemic drugs: Secondary | ICD-10-CM | POA: Diagnosis not present

## 2022-07-20 DIAGNOSIS — M1990 Unspecified osteoarthritis, unspecified site: Secondary | ICD-10-CM | POA: Diagnosis not present

## 2022-07-20 DIAGNOSIS — F17200 Nicotine dependence, unspecified, uncomplicated: Secondary | ICD-10-CM | POA: Diagnosis not present

## 2022-07-24 DIAGNOSIS — Z993 Dependence on wheelchair: Secondary | ICD-10-CM | POA: Diagnosis not present

## 2022-07-24 DIAGNOSIS — M1990 Unspecified osteoarthritis, unspecified site: Secondary | ICD-10-CM | POA: Diagnosis not present

## 2022-07-24 DIAGNOSIS — Z7902 Long term (current) use of antithrombotics/antiplatelets: Secondary | ICD-10-CM | POA: Diagnosis not present

## 2022-07-24 DIAGNOSIS — E538 Deficiency of other specified B group vitamins: Secondary | ICD-10-CM | POA: Diagnosis not present

## 2022-07-24 DIAGNOSIS — E782 Mixed hyperlipidemia: Secondary | ICD-10-CM | POA: Diagnosis not present

## 2022-07-24 DIAGNOSIS — E119 Type 2 diabetes mellitus without complications: Secondary | ICD-10-CM | POA: Diagnosis not present

## 2022-07-24 DIAGNOSIS — F17200 Nicotine dependence, unspecified, uncomplicated: Secondary | ICD-10-CM | POA: Diagnosis not present

## 2022-07-24 DIAGNOSIS — K219 Gastro-esophageal reflux disease without esophagitis: Secondary | ICD-10-CM | POA: Diagnosis not present

## 2022-07-24 DIAGNOSIS — Z7982 Long term (current) use of aspirin: Secondary | ICD-10-CM | POA: Diagnosis not present

## 2022-07-24 DIAGNOSIS — Z9181 History of falling: Secondary | ICD-10-CM | POA: Diagnosis not present

## 2022-07-24 DIAGNOSIS — R1310 Dysphagia, unspecified: Secondary | ICD-10-CM | POA: Diagnosis not present

## 2022-07-24 DIAGNOSIS — Z7984 Long term (current) use of oral hypoglycemic drugs: Secondary | ICD-10-CM | POA: Diagnosis not present

## 2022-07-24 DIAGNOSIS — I480 Paroxysmal atrial fibrillation: Secondary | ICD-10-CM | POA: Diagnosis not present

## 2022-07-31 DIAGNOSIS — I480 Paroxysmal atrial fibrillation: Secondary | ICD-10-CM | POA: Diagnosis not present

## 2022-07-31 DIAGNOSIS — W07XXXA Fall from chair, initial encounter: Secondary | ICD-10-CM | POA: Diagnosis not present

## 2022-07-31 DIAGNOSIS — E119 Type 2 diabetes mellitus without complications: Secondary | ICD-10-CM | POA: Diagnosis not present

## 2022-07-31 DIAGNOSIS — Z7982 Long term (current) use of aspirin: Secondary | ICD-10-CM | POA: Diagnosis not present

## 2022-07-31 DIAGNOSIS — Z993 Dependence on wheelchair: Secondary | ICD-10-CM | POA: Diagnosis not present

## 2022-07-31 DIAGNOSIS — Z7902 Long term (current) use of antithrombotics/antiplatelets: Secondary | ICD-10-CM | POA: Diagnosis not present

## 2022-07-31 DIAGNOSIS — R1310 Dysphagia, unspecified: Secondary | ICD-10-CM | POA: Diagnosis not present

## 2022-07-31 DIAGNOSIS — G20B2 Parkinson's disease with dyskinesia, with fluctuations: Secondary | ICD-10-CM | POA: Diagnosis not present

## 2022-07-31 DIAGNOSIS — E782 Mixed hyperlipidemia: Secondary | ICD-10-CM | POA: Diagnosis not present

## 2022-07-31 DIAGNOSIS — K219 Gastro-esophageal reflux disease without esophagitis: Secondary | ICD-10-CM | POA: Diagnosis not present

## 2022-07-31 DIAGNOSIS — E538 Deficiency of other specified B group vitamins: Secondary | ICD-10-CM | POA: Diagnosis not present

## 2022-07-31 DIAGNOSIS — I2510 Atherosclerotic heart disease of native coronary artery without angina pectoris: Secondary | ICD-10-CM | POA: Diagnosis not present

## 2022-07-31 DIAGNOSIS — E038 Other specified hypothyroidism: Secondary | ICD-10-CM | POA: Diagnosis not present

## 2022-07-31 DIAGNOSIS — F17200 Nicotine dependence, unspecified, uncomplicated: Secondary | ICD-10-CM | POA: Diagnosis not present

## 2022-07-31 DIAGNOSIS — M1990 Unspecified osteoarthritis, unspecified site: Secondary | ICD-10-CM | POA: Diagnosis not present

## 2022-07-31 DIAGNOSIS — Z9181 History of falling: Secondary | ICD-10-CM | POA: Diagnosis not present

## 2022-07-31 DIAGNOSIS — Z7984 Long term (current) use of oral hypoglycemic drugs: Secondary | ICD-10-CM | POA: Diagnosis not present

## 2022-07-31 DIAGNOSIS — I252 Old myocardial infarction: Secondary | ICD-10-CM | POA: Diagnosis not present

## 2022-08-03 DIAGNOSIS — R1310 Dysphagia, unspecified: Secondary | ICD-10-CM | POA: Diagnosis not present

## 2022-08-03 DIAGNOSIS — E119 Type 2 diabetes mellitus without complications: Secondary | ICD-10-CM | POA: Diagnosis not present

## 2022-08-06 DIAGNOSIS — G20C Parkinsonism, unspecified: Secondary | ICD-10-CM | POA: Diagnosis not present

## 2022-08-06 DIAGNOSIS — I2109 ST elevation (STEMI) myocardial infarction involving other coronary artery of anterior wall: Secondary | ICD-10-CM | POA: Diagnosis not present

## 2022-08-06 DIAGNOSIS — I480 Paroxysmal atrial fibrillation: Secondary | ICD-10-CM | POA: Diagnosis not present

## 2022-08-07 DIAGNOSIS — K219 Gastro-esophageal reflux disease without esophagitis: Secondary | ICD-10-CM | POA: Diagnosis not present

## 2022-08-07 DIAGNOSIS — Z9181 History of falling: Secondary | ICD-10-CM | POA: Diagnosis not present

## 2022-08-07 DIAGNOSIS — Z7982 Long term (current) use of aspirin: Secondary | ICD-10-CM | POA: Diagnosis not present

## 2022-08-07 DIAGNOSIS — I480 Paroxysmal atrial fibrillation: Secondary | ICD-10-CM | POA: Diagnosis not present

## 2022-08-07 DIAGNOSIS — Z993 Dependence on wheelchair: Secondary | ICD-10-CM | POA: Diagnosis not present

## 2022-08-07 DIAGNOSIS — Z7984 Long term (current) use of oral hypoglycemic drugs: Secondary | ICD-10-CM | POA: Diagnosis not present

## 2022-08-07 DIAGNOSIS — F17200 Nicotine dependence, unspecified, uncomplicated: Secondary | ICD-10-CM | POA: Diagnosis not present

## 2022-08-07 DIAGNOSIS — Z7902 Long term (current) use of antithrombotics/antiplatelets: Secondary | ICD-10-CM | POA: Diagnosis not present

## 2022-08-07 DIAGNOSIS — E119 Type 2 diabetes mellitus without complications: Secondary | ICD-10-CM | POA: Diagnosis not present

## 2022-08-07 DIAGNOSIS — E782 Mixed hyperlipidemia: Secondary | ICD-10-CM | POA: Diagnosis not present

## 2022-08-07 DIAGNOSIS — R1310 Dysphagia, unspecified: Secondary | ICD-10-CM | POA: Diagnosis not present

## 2022-08-07 DIAGNOSIS — E538 Deficiency of other specified B group vitamins: Secondary | ICD-10-CM | POA: Diagnosis not present

## 2022-08-07 DIAGNOSIS — M1990 Unspecified osteoarthritis, unspecified site: Secondary | ICD-10-CM | POA: Diagnosis not present

## 2022-08-14 ENCOUNTER — Non-Acute Institutional Stay: Payer: Medicare Other | Admitting: Hospice

## 2022-08-14 DIAGNOSIS — E119 Type 2 diabetes mellitus without complications: Secondary | ICD-10-CM

## 2022-08-14 DIAGNOSIS — Z9181 History of falling: Secondary | ICD-10-CM | POA: Diagnosis not present

## 2022-08-14 DIAGNOSIS — W19XXXD Unspecified fall, subsequent encounter: Secondary | ICD-10-CM

## 2022-08-14 DIAGNOSIS — Z7984 Long term (current) use of oral hypoglycemic drugs: Secondary | ICD-10-CM | POA: Diagnosis not present

## 2022-08-14 DIAGNOSIS — Z993 Dependence on wheelchair: Secondary | ICD-10-CM | POA: Diagnosis not present

## 2022-08-14 DIAGNOSIS — E538 Deficiency of other specified B group vitamins: Secondary | ICD-10-CM | POA: Diagnosis not present

## 2022-08-14 DIAGNOSIS — I480 Paroxysmal atrial fibrillation: Secondary | ICD-10-CM | POA: Diagnosis not present

## 2022-08-14 DIAGNOSIS — Z7982 Long term (current) use of aspirin: Secondary | ICD-10-CM | POA: Diagnosis not present

## 2022-08-14 DIAGNOSIS — K219 Gastro-esophageal reflux disease without esophagitis: Secondary | ICD-10-CM | POA: Diagnosis not present

## 2022-08-14 DIAGNOSIS — F0280 Dementia in other diseases classified elsewhere without behavioral disturbance: Secondary | ICD-10-CM

## 2022-08-14 DIAGNOSIS — F17200 Nicotine dependence, unspecified, uncomplicated: Secondary | ICD-10-CM | POA: Diagnosis not present

## 2022-08-14 DIAGNOSIS — G20B1 Parkinson's disease with dyskinesia, without mention of fluctuations: Secondary | ICD-10-CM

## 2022-08-14 DIAGNOSIS — E782 Mixed hyperlipidemia: Secondary | ICD-10-CM | POA: Diagnosis not present

## 2022-08-14 DIAGNOSIS — G20A1 Parkinson'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 dyskinesia, without mention of fluctuations: Secondary | ICD-10-CM | POA: Diagnosis not present

## 2022-08-14 DIAGNOSIS — R1310 Dysphagia, unspecified: Secondary | ICD-10-CM | POA: Diagnosis not present

## 2022-08-14 DIAGNOSIS — Z515 Encounter for palliative care: Secondary | ICD-10-CM | POA: Diagnosis not present

## 2022-08-14 DIAGNOSIS — M1990 Unspecified osteoarthritis, unspecified site: Secondary | ICD-10-CM | POA: Diagnosis not present

## 2022-08-14 DIAGNOSIS — Z7902 Long term (current) use of antithrombotics/antiplatelets: Secondary | ICD-10-CM | POA: Diagnosis not present
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
## 2022-08-15 DIAGNOSIS — G20B2 Parkinson's disease with dyskinesia, with fluctuations: Secondary | ICD-10-CM | POA: Diagnosis not present

## 2022-08-15 DIAGNOSIS — I480 Paroxysmal atrial fibrillation: Secondary | ICD-10-CM | POA: Diagnosis not present

## 2022-08-15 DIAGNOSIS — K219 Gastro-esophageal reflux disease without esophagitis: Secondary | ICD-10-CM | POA: Diagnosis not present

## 2022-08-21 DIAGNOSIS — F17200 Nicotine dependence, unspecified, uncomplicated: Secondary | ICD-10-CM | POA: Diagnosis not present

## 2022-08-21 DIAGNOSIS — Z7982 Long term (current) use of aspirin: Secondary | ICD-10-CM | POA: Diagnosis not present

## 2022-08-21 DIAGNOSIS — Z7902 Long term (current) use of antithrombotics/antiplatelets: Secondary | ICD-10-CM | POA: Diagnosis not present

## 2022-08-21 DIAGNOSIS — R1310 Dysphagia, unspecified: Secondary | ICD-10-CM | POA: Diagnosis not present

## 2022-08-21 DIAGNOSIS — K219 Gastro-esophageal reflux disease without esophagitis: Secondary | ICD-10-CM | POA: Diagnosis not present

## 2022-08-21 DIAGNOSIS — Z993 Dependence on wheelchair: Secondary | ICD-10-CM | POA: Diagnosis not present

## 2022-08-21 DIAGNOSIS — Z9181 History of falling: Secondary | ICD-10-CM | POA: Diagnosis not present

## 2022-08-21 DIAGNOSIS — E782 Mixed hyperlipidemia: Secondary | ICD-10-CM | POA: Diagnosis not present

## 2022-08-21 DIAGNOSIS — E538 Deficiency of other specified B group vitamins: Secondary | ICD-10-CM | POA: Diagnosis not present

## 2022-08-21 DIAGNOSIS — Z7984 Long term (current) use of oral hypoglycemic drugs: Secondary | ICD-10-CM | POA: Diagnosis not present

## 2022-08-21 DIAGNOSIS — E119 Type 2 diabetes mellitus without complications: Secondary | ICD-10-CM | POA: Diagnosis not present

## 2022-08-21 DIAGNOSIS — I480 Paroxysmal atrial fibrillation: Secondary | ICD-10-CM | POA: Diagnosis not present

## 2022-08-21 DIAGNOSIS — M1990 Unspecified osteoarthritis, unspecified site: Secondary | ICD-10-CM | POA: Diagnosis not present

## 2022-08-28 DIAGNOSIS — E538 Deficiency of other specified B group vitamins: Secondary | ICD-10-CM | POA: Diagnosis not present

## 2022-08-28 DIAGNOSIS — F17200 Nicotine dependence, unspecified, uncomplicated: Secondary | ICD-10-CM | POA: Diagnosis not present

## 2022-08-28 DIAGNOSIS — Z7984 Long term (current) use of oral hypoglycemic drugs: Secondary | ICD-10-CM | POA: Diagnosis not present

## 2022-08-28 DIAGNOSIS — K219 Gastro-esophageal reflux disease without esophagitis: Secondary | ICD-10-CM | POA: Diagnosis not present

## 2022-08-28 DIAGNOSIS — R1310 Dysphagia, unspecified: Secondary | ICD-10-CM | POA: Diagnosis not present

## 2022-08-28 DIAGNOSIS — Z993 Dependence on wheelchair: Secondary | ICD-10-CM | POA: Diagnosis not present

## 2022-08-28 DIAGNOSIS — Z7902 Long term (current) use of antithrombotics/antiplatelets: Secondary | ICD-10-CM | POA: Diagnosis not present

## 2022-08-28 DIAGNOSIS — Z7982 Long term (current) use of aspirin: Secondary | ICD-10-CM | POA: Diagnosis not present

## 2022-08-28 DIAGNOSIS — E782 Mixed hyperlipidemia: Secondary | ICD-10-CM | POA: Diagnosis not present

## 2022-08-28 DIAGNOSIS — M1990 Unspecified osteoarthritis, unspecified site: Secondary | ICD-10-CM | POA: Diagnosis not present

## 2022-08-28 DIAGNOSIS — I480 Paroxysmal atrial fibrillation: Secondary | ICD-10-CM | POA: Diagnosis not present

## 2022-08-28 DIAGNOSIS — Z9181 History of falling: Secondary | ICD-10-CM | POA: Diagnosis not present

## 2022-08-28 DIAGNOSIS — E119 Type 2 diabetes mellitus without complications: Secondary | ICD-10-CM | POA: Diagnosis not present

## 2022-09-03 DIAGNOSIS — M1990 Unspecified osteoarthritis, unspecified site: Secondary | ICD-10-CM | POA: Diagnosis not present

## 2022-09-03 DIAGNOSIS — Z993 Dependence on wheelchair: Secondary | ICD-10-CM | POA: Diagnosis not present

## 2022-09-03 DIAGNOSIS — Z7982 Long term (current) use of aspirin: Secondary | ICD-10-CM | POA: Diagnosis not present

## 2022-09-03 DIAGNOSIS — R1310 Dysphagia, unspecified: Secondary | ICD-10-CM | POA: Diagnosis not present

## 2022-09-03 DIAGNOSIS — Z7902 Long term (current) use of antithrombotics/antiplatelets: Secondary | ICD-10-CM | POA: Diagnosis not present

## 2022-09-03 DIAGNOSIS — I480 Paroxysmal atrial fibrillation: Secondary | ICD-10-CM | POA: Diagnosis not present

## 2022-09-03 DIAGNOSIS — F17200 Nicotine dependence, unspecified, uncomplicated: Secondary | ICD-10-CM | POA: Diagnosis not present

## 2022-09-03 DIAGNOSIS — K219 Gastro-esophageal reflux disease without esophagitis: Secondary | ICD-10-CM | POA: Diagnosis not present

## 2022-09-03 DIAGNOSIS — E782 Mixed hyperlipidemia: Secondary | ICD-10-CM | POA: Diagnosis not present

## 2022-09-03 DIAGNOSIS — Z7984 Long term (current) use of oral hypoglycemic drugs: Secondary | ICD-10-CM | POA: Diagnosis not present

## 2022-09-03 DIAGNOSIS — E119 Type 2 diabetes mellitus without complications: Secondary | ICD-10-CM | POA: Diagnosis not present

## 2022-09-03 DIAGNOSIS — E538 Deficiency of other specified B group vitamins: Secondary | ICD-10-CM | POA: Diagnosis not present

## 2022-09-03 DIAGNOSIS — Z9181 History of falling: Secondary | ICD-10-CM | POA: Diagnosis not present

## 2022-09-11 DIAGNOSIS — Z7982 Long term (current) use of aspirin: Secondary | ICD-10-CM | POA: Diagnosis not present

## 2022-09-11 DIAGNOSIS — Z9181 History of falling: Secondary | ICD-10-CM | POA: Diagnosis not present

## 2022-09-11 DIAGNOSIS — E119 Type 2 diabetes mellitus without complications: Secondary | ICD-10-CM | POA: Diagnosis not present

## 2022-09-11 DIAGNOSIS — Z7984 Long term (current) use of oral hypoglycemic drugs: Secondary | ICD-10-CM | POA: Diagnosis not present

## 2022-09-11 DIAGNOSIS — K219 Gastro-esophageal reflux disease without esophagitis: Secondary | ICD-10-CM | POA: Diagnosis not present

## 2022-09-11 DIAGNOSIS — Z993 Dependence on wheelchair: Secondary | ICD-10-CM | POA: Diagnosis not present

## 2022-09-11 DIAGNOSIS — Z7902 Long term (current) use of antithrombotics/antiplatelets: Secondary | ICD-10-CM | POA: Diagnosis not present

## 2022-09-11 DIAGNOSIS — I480 Paroxysmal atrial fibrillation: Secondary | ICD-10-CM | POA: Diagnosis not present

## 2022-09-11 DIAGNOSIS — M1990 Unspecified osteoarthritis, unspecified site: Secondary | ICD-10-CM | POA: Diagnosis not present

## 2022-09-11 DIAGNOSIS — F17200 Nicotine dependence, unspecified, uncomplicated: Secondary | ICD-10-CM | POA: Diagnosis not present

## 2022-09-11 DIAGNOSIS — E782 Mixed hyperlipidemia: Secondary | ICD-10-CM | POA: Diagnosis not present

## 2022-09-11 DIAGNOSIS — R1310 Dysphagia, unspecified: Secondary | ICD-10-CM | POA: Diagnosis not present

## 2022-09-11 DIAGNOSIS — E538 Deficiency of other specified B group vitamins: Secondary | ICD-10-CM | POA: Diagnosis not present

## 2022-09-12 DIAGNOSIS — K219 Gastro-esophageal reflux disease without esophagitis: Secondary | ICD-10-CM | POA: Diagnosis not present

## 2022-09-12 DIAGNOSIS — E119 Type 2 diabetes mellitus without complications: Secondary | ICD-10-CM | POA: Diagnosis not present

## 2022-09-12 DIAGNOSIS — I480 Paroxysmal atrial fibrillation: Secondary | ICD-10-CM | POA: Diagnosis not present

## 2022-09-12 DIAGNOSIS — D509 Iron deficiency anemia, unspecified: Secondary | ICD-10-CM | POA: Diagnosis not present

## 2022-09-13 ENCOUNTER — Non-Acute Institutional Stay: Payer: Medicare Other | Admitting: Hospice

## 2022-09-13 DIAGNOSIS — Z515 Encounter for palliative care: Secondary | ICD-10-CM | POA: Diagnosis not present

## 2022-09-13 DIAGNOSIS — G20B1 Parkinson's disease with dyskinesia, without mention of fluctuations: Secondary | ICD-10-CM

## 2022-09-13 DIAGNOSIS — E119 Type 2 diabetes mellitus without complications: Secondary | ICD-10-CM

## 2022-09-13 DIAGNOSIS — F0280 Dementia in other diseases classified elsewhere without behavioral disturbance: Secondary | ICD-10-CM

## 2022-09-13 DIAGNOSIS — R269 Unspecified abnormalities of gait and mobility: Secondary | ICD-10-CM

## 2022-09-13 DIAGNOSIS — G40909 Epilepsy, unspecified, not intractable, without status epilepticus: Secondary | ICD-10-CM
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
## 2022-09-17 DIAGNOSIS — Z79899 Other long term (current) drug therapy: Secondary | ICD-10-CM | POA: Diagnosis not present

## 2022-09-18 DIAGNOSIS — Z7902 Long term (current) use of antithrombotics/antiplatelets: Secondary | ICD-10-CM | POA: Diagnosis not present

## 2022-09-18 DIAGNOSIS — M1990 Unspecified osteoarthritis, unspecified site: Secondary | ICD-10-CM | POA: Diagnosis not present

## 2022-09-18 DIAGNOSIS — Z7984 Long term (current) use of oral hypoglycemic drugs: Secondary | ICD-10-CM | POA: Diagnosis not present

## 2022-09-18 DIAGNOSIS — R1310 Dysphagia, unspecified: Secondary | ICD-10-CM | POA: Diagnosis not present

## 2022-09-18 DIAGNOSIS — Z993 Dependence on wheelchair: Secondary | ICD-10-CM | POA: Diagnosis not present

## 2022-09-18 DIAGNOSIS — I480 Paroxysmal atrial fibrillation: Secondary | ICD-10-CM | POA: Diagnosis not present

## 2022-09-18 DIAGNOSIS — E119 Type 2 diabetes mellitus without complications: Secondary | ICD-10-CM | POA: Diagnosis not present

## 2022-09-18 DIAGNOSIS — K219 Gastro-esophageal reflux disease without esophagitis: Secondary | ICD-10-CM | POA: Diagnosis not present

## 2022-09-18 DIAGNOSIS — E782 Mixed hyperlipidemia: Secondary | ICD-10-CM | POA: Diagnosis not present

## 2022-09-18 DIAGNOSIS — Z7982 Long term (current) use of aspirin: Secondary | ICD-10-CM | POA: Diagnosis not present

## 2022-09-18 DIAGNOSIS — F17200 Nicotine dependence, unspecified, uncomplicated: Secondary | ICD-10-CM | POA: Diagnosis not present

## 2022-09-18 DIAGNOSIS — Z9181 History of falling: Secondary | ICD-10-CM | POA: Diagnosis not present

## 2022-09-18 DIAGNOSIS — E538 Deficiency of other specified B group vitamins: Secondary | ICD-10-CM | POA: Diagnosis not present

## 2022-10-09 DIAGNOSIS — M79675 Pain in left toe(s): Secondary | ICD-10-CM | POA: Diagnosis not present

## 2022-10-09 DIAGNOSIS — L84 Corns and callosities: Secondary | ICD-10-CM | POA: Diagnosis not present

## 2022-10-09 DIAGNOSIS — B351 Tinea unguium: Secondary | ICD-10-CM | POA: Diagnosis not present

## 2022-10-10 DIAGNOSIS — I480 Paroxysmal atrial fibrillation: Secondary | ICD-10-CM | POA: Diagnosis not present

## 2022-10-10 DIAGNOSIS — K219 Gastro-esophageal reflux disease without esophagitis: Secondary | ICD-10-CM | POA: Diagnosis not present

## 2022-10-10 DIAGNOSIS — G20B2 Parkinson's disease with dyskinesia, with fluctuations: Secondary | ICD-10-CM | POA: Diagnosis not present

## 2022-10-15 DIAGNOSIS — G20B2 Parkinson's disease with dyskinesia, with fluctuations: Secondary | ICD-10-CM | POA: Diagnosis not present

## 2022-10-15 DIAGNOSIS — I252 Old myocardial infarction: Secondary | ICD-10-CM | POA: Diagnosis not present

## 2022-10-15 DIAGNOSIS — Z7902 Long term (current) use of antithrombotics/antiplatelets: Secondary | ICD-10-CM | POA: Diagnosis not present

## 2022-10-15 DIAGNOSIS — I2510 Atherosclerotic heart disease of native coronary artery without angina pectoris: Secondary | ICD-10-CM | POA: Diagnosis not present

## 2022-10-15 DIAGNOSIS — E038 Other specified hypothyroidism: Secondary | ICD-10-CM | POA: Diagnosis not present

## 2022-10-15 DIAGNOSIS — I480 Paroxysmal atrial fibrillation: Secondary | ICD-10-CM | POA: Diagnosis not present

## 2022-10-15 DIAGNOSIS — R030 Elevated blood-pressure reading, without diagnosis of hypertension: Secondary | ICD-10-CM | POA: Diagnosis not present

## 2022-10-23 ENCOUNTER — Non-Acute Institutional Stay: Payer: Medicare Other | Admitting: Hospice

## 2022-10-23 DIAGNOSIS — R269 Unspecified abnormalities of gait and mobility: Secondary | ICD-10-CM

## 2022-10-23 DIAGNOSIS — G40909 Epilepsy, unspecified, not intractable, without status epilepticus: Secondary | ICD-10-CM

## 2022-10-23 DIAGNOSIS — G20A1 Parkinson'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 dyskinesia, without mention of fluctuations: Secondary | ICD-10-CM | POA: Diagnosis not present

## 2022-10-23 DIAGNOSIS — F0280 Dementia in other diseases classified elsewhere without behavioral disturbance: Secondary | ICD-10-CM

## 2022-10-23 DIAGNOSIS — Z515 Encounter for palliative care: Secondary | ICD-10-CM
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
## 2022-10-27 ENCOUNTER — Emergency Department (HOSPITAL_COMMUNITY): Payer: Medicare Other

## 2022-10-27 ENCOUNTER — Emergency Department (HOSPITAL_COMMUNITY)
Admission: EM | Admit: 2022-10-27 | Discharge: 2022-10-27 | Disposition: A | Discharge status: Skilled Nursing Facility | Payer: Medicare Other | Attending: Emergency Medicine | Admitting: Emergency Medicine

## 2022-10-27 ENCOUNTER — Other Ambulatory Visit: Payer: Self-pay

## 2022-10-27 ENCOUNTER — Encounter (HOSPITAL_COMMUNITY): Payer: Self-pay | Admitting: *Deleted

## 2022-10-27 DIAGNOSIS — S0990XA Unspecified injury of head, initial encounter: Secondary | ICD-10-CM | POA: Diagnosis not present

## 2022-10-27 DIAGNOSIS — R404 Transient alteration of awareness: Secondary | ICD-10-CM | POA: Diagnosis not present

## 2022-10-27 DIAGNOSIS — S065XAA Traumatic subdural hemorrhage with loss of consciousness status unknown, initial encounter: Secondary | ICD-10-CM | POA: Insufficient documentation

## 2022-10-27 DIAGNOSIS — Z7401 Bed confinement status: Secondary | ICD-10-CM | POA: Diagnosis not present

## 2022-10-27 DIAGNOSIS — Z79899 Other long term (current) drug therapy: Secondary | ICD-10-CM | POA: Diagnosis not present

## 2022-10-27 DIAGNOSIS — I499 Cardiac arrhythmia, unspecified: Secondary | ICD-10-CM | POA: Diagnosis not present

## 2022-10-27 DIAGNOSIS — S0181XA Laceration without foreign body of other part of head, initial encounter: Secondary | ICD-10-CM | POA: Diagnosis not present

## 2022-10-27 DIAGNOSIS — F0280 Dementia in other diseases classified elsewhere without behavioral disturbance: Secondary | ICD-10-CM | POA: Diagnosis not present

## 2022-10-27 DIAGNOSIS — G20C Parkinsonism, unspecified: Secondary | ICD-10-CM | POA: Insufficient documentation

## 2022-10-27 DIAGNOSIS — Z7901 Long term (current) use of anticoagulants: Secondary | ICD-10-CM | POA: Insufficient documentation

## 2022-10-27 DIAGNOSIS — Z8616 Personal history of COVID-19: Secondary | ICD-10-CM | POA: Insufficient documentation

## 2022-10-27 DIAGNOSIS — E039 Hypothyroidism, unspecified: Secondary | ICD-10-CM | POA: Insufficient documentation

## 2022-10-27 DIAGNOSIS — Z7982 Long term (current) use of aspirin: Secondary | ICD-10-CM | POA: Diagnosis not present

## 2022-10-27 DIAGNOSIS — I10 Essential (primary) hypertension: Secondary | ICD-10-CM | POA: Diagnosis not present

## 2022-10-27 DIAGNOSIS — S199XXA Unspecified injury of neck, initial encounter: Secondary | ICD-10-CM | POA: Diagnosis not present

## 2022-10-27 DIAGNOSIS — W19XXXA Unspecified fall, initial encounter: Secondary | ICD-10-CM | POA: Insufficient documentation

## 2022-10-27 DIAGNOSIS — E119 Type 2 diabetes mellitus without complications: Secondary | ICD-10-CM | POA: Diagnosis not present

## 2022-10-27 DIAGNOSIS — Z743 Need for continuous supervision: Secondary | ICD-10-CM | POA: Diagnosis not present

## 2022-10-27 DIAGNOSIS — R6889 Other general symptoms and signs: Secondary | ICD-10-CM | POA: Diagnosis not present

## 2022-10-27 DIAGNOSIS — S51811A Laceration without foreign body of right forearm, initial encounter: Secondary | ICD-10-CM | POA: Insufficient documentation

## 2022-10-27 DIAGNOSIS — M542 Cervicalgia: Secondary | ICD-10-CM | POA: Diagnosis not present

## 2022-10-27 DIAGNOSIS — S065X0A Traumatic subdural hemorrhage without loss of consciousness, initial encounter: Secondary | ICD-10-CM | POA: Diagnosis not present

## 2022-10-27 LAB — CBC WITH DIFFERENTIAL/PLATELET
Abs Immature Granulocytes: 0.03 10*3/uL (ref 0.00–0.07)
Basophils Absolute: 0.1 10*3/uL (ref 0.0–0.1)
Basophils Relative: 1 %
Eosinophils Absolute: 0.4 10*3/uL (ref 0.0–0.5)
Eosinophils Relative: 5 %
HCT: 45.9 % (ref 39.0–52.0)
Hemoglobin: 14.6 g/dL (ref 13.0–17.0)
Immature Granulocytes: 0 %
Lymphocytes Relative: 28 %
Lymphs Abs: 2.2 10*3/uL (ref 0.7–4.0)
MCH: 27.7 pg (ref 26.0–34.0)
MCHC: 31.8 g/dL (ref 30.0–36.0)
MCV: 86.9 fL (ref 80.0–100.0)
Monocytes Absolute: 0.8 10*3/uL (ref 0.1–1.0)
Monocytes Relative: 10 %
Neutro Abs: 4.5 10*3/uL (ref 1.7–7.7)
Neutrophils Relative %: 56 %
Platelets: 265 10*3/uL (ref 150–400)
RBC: 5.28 MIL/uL (ref 4.22–5.81)
RDW: 13.1 % (ref 11.5–15.5)
WBC: 8 10*3/uL (ref 4.0–10.5)
nRBC: 0 % (ref 0.0–0.2)

## 2022-10-27 LAB — I-STAT CHEM 8, ED
BUN: 16 mg/dL (ref 8–23)
Calcium, Ion: 1.17 mmol/L (ref 1.15–1.40)
Chloride: 102 mmol/L (ref 98–111)
Creatinine, Ser: 0.8 mg/dL (ref 0.61–1.24)
Glucose, Bld: 113 mg/dL — ABNORMAL HIGH (ref 70–99)
HCT: 46 % (ref 39.0–52.0)
Hemoglobin: 15.6 g/dL (ref 13.0–17.0)
Potassium: 4.5 mmol/L (ref 3.5–5.1)
Sodium: 139 mmol/L (ref 135–145)
TCO2: 29 mmol/L (ref 22–32)

## 2022-10-27 LAB — BASIC METABOLIC PANEL
Anion gap: 10 (ref 5–15)
BUN: 12 mg/dL (ref 8–23)
CO2: 27 mmol/L (ref 22–32)
Calcium: 9.1 mg/dL (ref 8.9–10.3)
Chloride: 103 mmol/L (ref 98–111)
Creatinine, Ser: 0.86 mg/dL (ref 0.61–1.24)
GFR, Estimated: >60 mL/min (ref >60)
Glucose, Bld: 115 mg/dL — ABNORMAL HIGH (ref 70–99)
Potassium: 4.2 mmol/L (ref 3.5–5.1)
Sodium: 140 mmol/L (ref 135–145)

## 2022-10-27 MED ORDER — LIDOCAINE-EPINEPHRINE (PF) 2 %-1:200000 IJ SOLN
20.0000 mL | Freq: Once | INTRAMUSCULAR | Status: AC
  Administered 2022-10-27: 20 mL via INTRADERMAL
  Filled 2022-10-27: qty 20

## 2022-10-27 NOTE — ED Triage Notes (Signed)
Patient presents to ed via [facility] per ems patient was an unwitnessed fall, staff states patient was last seen around 2 am and was then found lying on the floor , lac to mid forehead and skin tear to right elbow. Per staff patient is at his mental baseline.

## 2022-10-27 NOTE — Discharge Instructions (Addendum)
Patient was here for head trauma after an unwitnessed fall.   On the workup we noted a small subdural bleed.   We discussed this with the neurosurgeons who recommended the patient discontinue Plavix. They will arrange for follow-up in 3 to 4 weeks and determine patient's ability to go back on Plavix.

## 2022-10-27 NOTE — ED Provider Notes (Signed)
[place] EMERGENCY DEPARTMENT AT [facility] Provider Note  CSN: [id number] Arrival date & time: 10/27/22 4010  Chief Complaint(s) Fall Triage Note  0532 Patient presents to ed via [facility] per ems patient was an unwitnessed fall, staff states patient was last seen around 2 am and was then found lying on the floor , lac to mid forehead and skin tear to right elbow. Per staff patient is at his mental baseline.    HPI Gerald Leach is a 89 y.o. male who presented from skilled nursing facility after an unwitnessed fall.  Last seen around 2 AM.  Found just prior to arrival.  Patient has a laceration to the mid left forehead.  Mental status is at baseline.  Moves all extremities.  No physical complaints.  The history is provided by the EMS personnel.    Past Medical History Past Medical History:  Diagnosis Date   Brain aneurysm    Colonic polyp 2003   Dementia [facility])    Diabetes mellitus without complication [facility])    ED (erectile dysfunction)    GERD (gastroesophageal reflux disease)    Hyperlipidemia    Hypertension    Hypothyroidism    Seizures (HCC)    per family   Seizures [facility])    Patient Active Problem List   Diagnosis Date Noted   Palliative care patient 09/14/2021   Hypokalemia 09/05/2021   Hypomagnesemia 09/05/2021   ST elevation myocardial infarction (STEMI) (HCC)    Goals of care, counseling/discussion    PAF (paroxysmal atrial fibrillation) (HCC) 09/04/2021   Acute ST elevation myocardial infarction (STEMI) of anterior wall (HCC) 09/03/2021   Ventricular tachycardia (HCC) 09/03/2021   COVID-19 virus infection 09/03/2021   Breakthrough seizure (HCC) 03/19/2021   Seizure (HCC) 03/18/2021   Dementia due to Parkinson'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 behavioral disturbance (HCC) 08/16/2020   Fall 08/11/2020   Closed compression fracture of L5 lumbar vertebra, initial encounter (HCC) 08/11/2020   Seizures (HCC) 03/05/2018   Leukocytosis 03/05/2018   Diabetic foot ulcers  (HCC) 04/23/2017   Parkinson disease    Gait instability    Diabetes mellitus type 2, controlled (HCC) 08/05/2015   Hypothyroidism 08/05/2015   Fall at home 10/02/2014   CNS aneurysm s/p coiling ([facility], 2002) 10/02/2014   High grade T7 central canal stenosis with T7 vertebral fracture s/p fall (02/2012) 10/02/2014   Right clinoid calcified meningioma vs thrombosed giant ICA aneurysm, conservative management. 10/02/2014   Syncope 02/05/2014   Drug-induced delirium(292.81) 05/16/2013   Seizure disorder (HCC) 04/21/2013   Seizure disorder, grand mal (HCC) 03/24/2013   Cervical compression fracture---C7 10/29/2012   Dysphagia 09/21/2012   Subdural hematoma (HCC) 09/20/2012   SOLAR KERATOSIS 02/17/2009   CARCINOMA, SKIN, SQUAMOUS CELL, FACE 04/19/2008   Hyperlipidemia with target LDL less than 70 01/27/2008   COLONIC POLYPS 11/15/2006   GERD 11/15/2006   Home Medication(s) Prior to Admission medications   Medication Sig Start Date End Date Taking? Authorizing Provider  acetaminophen (TYLENOL) 325 MG tablet Take 2 tablets (650 mg total) by mouth every 6 (six) hours as needed for mild pain (or Fever >/= 101). 03/24/21   [name], DO  amantadine (SYMMETREL) 100 MG capsule Take 1 capsule (100 mg total) by mouth daily. 05/18/21   Nita Sickle K, DO  amiodarone (PACERONE) 200 MG tablet Take 1 tablet (200 mg total) by mouth daily. 09/15/21   Erick Blinks, MD  aspirin EC 81 MG EC tablet Take 1 tablet (81 mg total) by mouth daily. Swallow  whole. 09/15/21   Erick Blinks, MD  atorvastatin (LIPITOR) 40 MG tablet Take 1 tablet (40 mg total) by mouth daily. 09/15/21   Erick Blinks, MD  carbidopa-levodopa (SINEMET IR) 25-100 MG tablet TAKE 1 TABLET BY MOUTH THREE TIMES A DAY Patient taking differently: Take 1 tablet by mouth 3 (three) times daily. 07/04/21   Nita Sickle K, DO  clopidogrel (PLAVIX) 75 MG tablet Take 1 tablet (75 mg total) by mouth daily. 09/15/21   Erick Blinks, MD   ferrous sulfate 325 (65 FE) MG tablet Take 325 mg by mouth daily with breakfast.    [provider]  Lacosamide 150 MG TABS Take 1 tablet (150 mg total) by mouth 2 (two) times daily. 05/31/21   Patel, Donika K, DO  levETIRAcetam (KEPPRA) 750 MG tablet TAKE 2 TABLETS (1,500 MG TOTAL) BY MOUTH 2 (TWO) TIMES DAILY. 06/26/21   Nita Sickle K, DO  levothyroxine (SYNTHROID) 50 MCG tablet Take 50 mcg by mouth every morning. On an empty stomach 03/21/22   [provider]  levothyroxine (SYNTHROID) 75 MCG tablet TAKE 1 TABLET BY MOUTH EVERY DAY 07/04/21   Nafziger, Kandee Keen, NP  LORazepam (ATIVAN) 1 MG tablet Take 1 tablet (1 mg total) by mouth every 6 (six) hours as needed for anxiety (dyspnea). 09/14/21   Erick Blinks, MD  metFORMIN (GLUCOPHAGE) 500 MG tablet Take 500 mg by mouth 2 (two) times daily. 11/01/21   [provider]  nitroGLYCERIN (NITROSTAT) 0.4 MG SL tablet Place 1 tablet (0.4 mg total) under the tongue every 5 (five) minutes x 3 doses as needed for chest pain. 09/14/21   Erick Blinks, MD  omeprazole (PRILOSEC) 20 MG capsule TAKE 1 CAPSULE BY MOUTH EVERY DAY Patient taking differently: Take 20 mg by mouth daily. 05/10/21   Nafziger, Kandee Keen, NP  ondansetron (ZOFRAN) 4 MG tablet Take 1 tablet (4 mg total) by mouth every 6 (six) hours as needed for nausea. 03/24/21   [name], DO  vitamin B-12 (CYANOCOBALAMIN) 1000 MCG tablet Take 1,000 mcg by mouth daily.    [provider]                                                                                                                                    Allergies Patient has no known allergies.  Review of Systems Review of Systems As noted in HPI  Physical Exam Vital Signs  I have reviewed the triage vital signs BP (!) 174/87   Pulse (!) 45   Temp (!) 97.4 F (36.3 C) (Axillary)   Resp (!) 22   Ht 6' (1.829 m)   Wt 65 kg   SpO2 (!) 81%   BMI 19.43 kg/m   Physical Exam Constitutional:       General: He is not in acute distress.    Appearance: He is well-developed. He is not diaphoretic.     Interventions: Cervical collar in place.  HENT:     Head: Normocephalic. Laceration present.      Right Ear: External ear normal.     Left Ear: External ear normal.  Eyes:     General: No scleral icterus.       Right eye: No discharge.        Left eye: No discharge.     Conjunctiva/sclera: Conjunctivae normal.     Pupils: Pupils are equal, round, and reactive to light.  Cardiovascular:     Rate and Rhythm: Regular rhythm.     Pulses:          Radial pulses are 2+ on the right side and 2+ on the left side.       Dorsalis pedis pulses are 2+ on the right side and 2+ on the left side.     Heart sounds: Normal heart sounds. No murmur heard.    No friction rub. No gallop.  Pulmonary:     Effort: Pulmonary effort is normal. No respiratory distress.     Breath sounds: Normal breath sounds. No stridor.  Abdominal:     General: There is no distension.     Palpations: Abdomen is soft.     Tenderness: There is no abdominal tenderness.  Musculoskeletal:     Right forearm: No tenderness.     Left forearm: No tenderness.       Arms:     Cervical back: Normal range of motion and neck supple. No bony tenderness.     Thoracic back: No bony tenderness.     Lumbar back: No bony tenderness.     Comments: Clavicle stable. Chest stable to AP/Lat compression. Pelvis stable to Lat compression. No obvious extremity deformity. No chest or abdominal wall contusion.  Skin:    General: Skin is warm.  Neurological:     Mental Status: He is alert and oriented to person, place, and time.     GCS: GCS eye subscore is 4. GCS verbal subscore is 5. GCS motor subscore is 6.     Comments: Moving all extremities      ED Results and Treatments Labs (all labs ordered are listed, but only abnormal results are displayed) Labs Reviewed  BASIC METABOLIC PANEL - Abnormal; Notable for the following  components:      Result Value   Glucose, Bld 115 (*)    All other components within normal limits  I-STAT CHEM 8, ED - Abnormal; Notable for the following components:   Glucose, Bld 113 (*)    All other components within normal limits  CBC WITH DIFFERENTIAL/PLATELET                                                                                                                         EKG  EKG Interpretation  Date/Time:    Ventricular Rate:    PR Interval:    QRS Duration:   QT Interval:    QTC Calculation:   R Axis:  Text Interpretation:         Radiology CT Head Wo Contrast  Result Date: 10/27/2022 CLINICAL DATA:  Head and neck trauma EXAM: CT HEAD WITHOUT CONTRAST CT CERVICAL SPINE WITHOUT CONTRAST TECHNIQUE: Multidetector CT imaging of the head and cervical spine was performed following the standard protocol without intravenous contrast. Multiplanar CT image reconstructions of the cervical spine were also generated. RADIATION DOSE REDUCTION: This exam was performed according to the departmental dose-optimization program which includes automated exposure control, adjustment of the mA and/or kV according to patient size and/or use of iterative reconstruction technique. COMPARISON:  03/26/2022 FINDINGS: CT HEAD FINDINGS Brain: Subtle right parafalcine high-density thickening, convincing for subdural hemorrhage when compared to prior and measuring up to 2 mm. No associated mass effect. Atrophy and chronic small vessel ischemia. Calcified right ICA aneurysm with bulky dystrophic calcification. No adjacent brain edema. Prior right-sided craniotomy with asymmetric right atrophy. Vascular: As above Skull: Unremarkable craniotomy site Sinuses/Orbits: No visible injury CT CERVICAL SPINE FINDINGS Alignment: Normal. Skull base and vertebrae: No acute finding. Chronic T3 superior endplate fracture Soft tissues and spinal canal: No prevertebral fluid or swelling. No visible canal hematoma. Disc  levels:  Ordinary endplate and facet spurring.  C2-3 fusion. Upper chest: No visible injury Critical Value/emergent results were called by telephone at the time of interpretation on 10/27/2022 at 6:11 am to provider [facility] , who verbally acknowledged these results. IMPRESSION: 1. Trace right parafalcine subdural hemorrhage only measuring 2 mm in thickness, convincing when compared to prior. 2. Negative for cervical spine fracture. 3. Large chronic right ICA aneurysm with heavy calcification. Generalized brain atrophy and chronic small vessel ischemia. Electronically Signed   By: Tiburcio Pea M.D.   On: 10/27/2022 06:12   CT Cervical Spine Wo Contrast  Result Date: 10/27/2022 CLINICAL DATA:  Head and neck trauma EXAM: CT HEAD WITHOUT CONTRAST CT CERVICAL SPINE WITHOUT CONTRAST TECHNIQUE: Multidetector CT imaging of the head and cervical spine was performed following the standard protocol without intravenous contrast. Multiplanar CT image reconstructions of the cervical spine were also generated. RADIATION DOSE REDUCTION: This exam was performed according to the departmental dose-optimization program which includes automated exposure control, adjustment of the mA and/or kV according to patient size and/or use of iterative reconstruction technique. COMPARISON:  03/26/2022 FINDINGS: CT HEAD FINDINGS Brain: Subtle right parafalcine high-density thickening, convincing for subdural hemorrhage when compared to prior and measuring up to 2 mm. No associated mass effect. Atrophy and chronic small vessel ischemia. Calcified right ICA aneurysm with bulky dystrophic calcification. No adjacent brain edema. Prior right-sided craniotomy with asymmetric right atrophy. Vascular: As above Skull: Unremarkable craniotomy site Sinuses/Orbits: No visible injury CT CERVICAL SPINE FINDINGS Alignment: Normal. Skull base and vertebrae: No acute finding. Chronic T3 superior endplate fracture Soft tissues and spinal canal: No  prevertebral fluid or swelling. No visible canal hematoma. Disc levels:  Ordinary endplate and facet spurring.  C2-3 fusion. Upper chest: No visible injury Critical Value/emergent results were called by telephone at the time of interpretation on 10/27/2022 at 6:11 am to provider [facility] , who verbally acknowledged these results. IMPRESSION: 1. Trace right parafalcine subdural hemorrhage only measuring 2 mm in thickness, convincing when compared to prior. 2. Negative for cervical spine fracture. 3. Large chronic right ICA aneurysm with heavy calcification. Generalized brain atrophy and chronic small vessel ischemia. Electronically Signed   By: Tiburcio Pea M.D.   On: 10/27/2022 06:12    Medications Ordered in ED Medications  lidocaine-EPINEPHrine (XYLOCAINE W/EPI)  2 %-1:200000 (PF) injection 20 mL (20 mLs Intradermal Given by Other 10/27/22 0546)                                                                                                                                     Procedures .Marland KitchenLaceration Repair  Date/Time: 10/27/2022 6:49 AM  Performed by: Nira Conn, MD Authorized by: Nira Conn, MD   Consent:    Consent obtained:  Emergent situation Universal protocol:    Imaging studies available: yes     Patient identity confirmed:  Arm band Laceration details:    Location:  Face   Face location:  Forehead   Length (cm):  2.5   Depth (mm):  2 Pre-procedure details:    Preparation:  Patient was prepped and draped in usual sterile fashion and imaging obtained to evaluate for foreign bodies Exploration:    Hemostasis achieved with:  Direct pressure Treatment:    Area cleansed with:  Saline   Amount of cleaning:  Standard   Debridement:  None Skin repair:    Repair method:  Steri-Strips   Number of Steri-Strips:  3 Approximation:    Approximation:  Close Repair type:    Repair type:  Simple Post-procedure details:    Dressing:  Open (no dressing)    Procedure completion:  Tolerated .Critical Care  Performed by: Nira Conn, MD Authorized by: Nira Conn, MD   Critical care provider statement:    Critical care time (minutes):  30   Critical care was necessary to treat or prevent imminent or life-threatening deterioration of the following conditions:  CNS failure or compromise   Critical care was time spent personally by me on the following activities:  Development of treatment plan with patient or surrogate, discussions with consultants, evaluation of patient's response to treatment, examination of patient, ordering and review of laboratory studies, ordering and review of radiographic studies, ordering and performing treatments and interventions, pulse oximetry, re-evaluation of patient's condition and review of old charts   (including critical care time)  Medical Decision Making / ED Course  Click here for ABCD2, HEART and other calculators  Medical Decision Making Amount and/or Complexity of Data Reviewed Independent Historian: EMS Labs: ordered. Decision-making details documented in ED Course. Radiology: ordered and independent interpretation performed. Decision-making details documented in ED Course.  Risk Prescription drug management. Decision regarding hospitalization.    This patient presents to the ED for concern of head injury from unwitnessed fall, this involves an extensive number of treatment options, and is a complaint that carries with it a high risk of complications and morbidity. The differential diagnosis includes but not limited to:  Will assess for acute ICH or cervical fracture. No other findings concerning Serious injuries on exam.   Work up Interpretation and Management:  Laboratory Tests ordered listed below with my independent interpretation:    Imaging Studies ordered listed below with my independent  interpretation:     Clinical Course as of 10/27/22 0734  Sat Oct 27, 2022   0604 CBC without leukocytosis or anemia  Metabolic panel without significant electrolyte derangements or renal sufficiency  CT head with calcified right ICA aneurysm.  Appears to be stable.  I do not appreciate any obvious acute intracranial bleed.  CT cervical spine w/o obvious fracture [PC]  [phone] Radiology noted small right SDH. [PC]  [id number] Laceration irrigated and closed as above  [PC]  0646 Consult to NSU  [PC]  0710 I spoke with Dr. Conchita Paris from neurosurgery who recommended discontinuing patient's Plavix.  No need for operative management for repeat CT scan at this time.  He recommended patient follow-up in 3 to 4 weeks in his office. [PC]  [id number] I spoke with patient's son and updated him on findings and recommendations. [PC]    Clinical Course User Index [PC] Kayren Holck, Amadeo Garnet, MD      Final Clinical Impression(s) / ED Diagnoses Final diagnoses:  Subdural hematoma due to concussion, with unknown loss of consciousness status, initial encounter [facility])  Facial laceration, initial encounter   The patient appears reasonably screened and/or stabilized for discharge and I doubt any other medical condition or other [facility] requiring further screening, evaluation, or treatment in the ED at this time. I have discussed the findings, Dx and Tx plan with the patient/family who expressed understanding and agree(s) with the plan. Discharge instructions discussed at length. The patient/family was given strict return precautions who verbalized understanding of the instructions. No further questions at time of discharge.  Disposition: Discharge  Condition: Good  ED Discharge Orders     None         Follow Up: Lisbeth Renshaw, MD [address] [place] [address] [phone]  Call  to schedule an appointment for close follow up in 3-4 weeks for subdural hematoma           This chart was dictated using voice recognition software.  Despite best  efforts to proofread,  errors can occur which can change the documentation meaning.    Nira Conn, MD 10/27/22 [phone]

## 2022-10-27 NOTE — ED Notes (Addendum)
..  Trauma Response Nurse Documentation   Gerald Leach is a 89 y.o. male arriving to [facility] ED via EMS  On clopidogrel 75 mg daily. Trauma was activated as a Level 2 by charge nurse based on the following trauma criteria Elderly patients > 65 with head trauma on anti-coagulation (excluding ASA).  Patient cleared for CT by Dr. Eudelia Bunch. Pt transported to CT with trauma response nurse present to monitor. RN remained with the patient throughout their absence from the department for clinical observation.     History   Past Medical History:  Diagnosis Date   Brain aneurysm    Colonic polyp 2003   Dementia (HCC)    Diabetes mellitus without complication [facility])    ED (erectile dysfunction)    GERD (gastroesophageal reflux disease)    Hyperlipidemia    Hypertension    Hypothyroidism    Seizures (HCC)    per family   Seizures [facility])      Past Surgical History:  Procedure Laterality Date   Aneurysmal clipping     brain aneurysm surgery     at  [facility]   carotid artery aneurysm     COLONOSCOPY     polyps   CRANIOTOMY Right 09/19/2012   Procedure: CRANIOTOMY HEMATOMA EVACUATION SUBDURAL;  Surgeon: Clydene Fake, MD;  Location: [facility] NEURO ORS;  Service: Neurosurgery;  Laterality: Right;   CRANIOTOMY Right 09/22/2012   Procedure: CRANIOTOMY HEMATOMA EVACUATION SUBDURAL;  Surgeon: Clydene Fake, MD;  Location: [facility] NEURO ORS;  Service: Neurosurgery;  Laterality: Right;   Craniotomy.     right hernia         Initial Focused Assessment (If applicable, or please see trauma documentation):  See narrative CT's Completed:   CT Head and CT C-Spine   Interventions:  -Initial trauma assessment -log roll -full linen change, depends very saturated with urine -transport to/from CT -wound care  Plan for disposition:  Admit  Consults completed:  2956 NS consult called  Event Summary: Pt transported from [place]? NH after unwitnessed fall, pt found on floor @0500 , last seen @0200 .  Wound noted to forehead caked with dried blood. Ccollar in place. Pupils 3mm PEARL, pt does not readily answer questions, does grimace with wound care.  Pt noted to be saturated in malodorous urine, log rolled and cleaned, new linens and depends placed on pt. Pt transported to CT and back without incident.  Primary paramedic to follow up on family. Wound to forehead cleaned and steri strips applied by Dr. Eudelia Bunch. Tegaderm placed on skin tear to R elbow.  Rads noted small SDH on scans per Dr. Eudelia Bunch, ccollar removed. No consults placed at this time.     Bedside handoff with ED RNKelly/ Paramedic Gerald Leach.    Gerald Leach  Trauma Response RN  Please call TRN at [phone] for further assistance.

## 2022-10-27 NOTE — ED Notes (Signed)
Patient transported to CT 

## 2022-10-27 NOTE — TOC CAGE-AID Note (Signed)
Transition of Care [facility]) - CAGE-AID Screening   Patient Details  Name: QUI SIT MRN: [id number] Date of Birth: May 02, 1934     Judie Bonus, RN Phone Number: 10/27/2022, 6:46 AM   Clinical Narrative:  Pt unable to participate due to dementia/new TBI. Pt is mostly nonverbal.   CAGE-AID Screening:  Unable to obtain

## 2022-10-27 NOTE — ED Notes (Signed)
Returned from CT.

## 2022-10-31 DIAGNOSIS — W010XXA Fall on same level from slipping, tripping and stumbling without subsequent striking against object, initial encounter: Secondary | ICD-10-CM | POA: Diagnosis not present

## 2022-10-31 DIAGNOSIS — S0181XA Laceration without foreign body of other part of head, initial encounter: Secondary | ICD-10-CM | POA: Diagnosis not present

## 2022-10-31 DIAGNOSIS — S065X0A Traumatic subdural hemorrhage without loss of consciousness, initial encounter: Secondary | ICD-10-CM | POA: Diagnosis not present

## 2022-11-07 DIAGNOSIS — W010XXA Fall on same level from slipping, tripping and stumbling without subsequent striking against object, initial encounter: Secondary | ICD-10-CM | POA: Diagnosis not present

## 2022-11-07 DIAGNOSIS — I739 Peripheral vascular disease, unspecified: Secondary | ICD-10-CM | POA: Diagnosis not present

## 2022-11-07 DIAGNOSIS — I480 Paroxysmal atrial fibrillation: Secondary | ICD-10-CM | POA: Diagnosis not present

## 2022-11-07 DIAGNOSIS — E1151 Type 2 diabetes mellitus with diabetic peripheral angiopathy without gangrene: Secondary | ICD-10-CM | POA: Diagnosis not present

## 2022-11-07 DIAGNOSIS — E039 Hypothyroidism, unspecified: Secondary | ICD-10-CM | POA: Diagnosis not present

## 2022-11-09 DIAGNOSIS — E039 Hypothyroidism, unspecified: Secondary | ICD-10-CM | POA: Diagnosis not present

## 2022-11-14 DIAGNOSIS — E039 Hypothyroidism, unspecified: Secondary | ICD-10-CM | POA: Diagnosis not present

## 2022-11-14 DIAGNOSIS — W010XXA Fall on same level from slipping, tripping and stumbling without subsequent striking against object, initial encounter: Secondary | ICD-10-CM | POA: Diagnosis not present

## 2022-11-14 DIAGNOSIS — I480 Paroxysmal atrial fibrillation: Secondary | ICD-10-CM | POA: Diagnosis not present

## 2022-11-17 DIAGNOSIS — E785 Hyperlipidemia, unspecified: Secondary | ICD-10-CM | POA: Diagnosis not present

## 2022-11-17 DIAGNOSIS — S32050S Wedge compression fracture of fifth lumbar vertebra, sequela: Secondary | ICD-10-CM | POA: Diagnosis not present

## 2022-11-17 DIAGNOSIS — I10 Essential (primary) hypertension: Secondary | ICD-10-CM | POA: Diagnosis not present

## 2022-11-17 DIAGNOSIS — K219 Gastro-esophageal reflux disease without esophagitis: Secondary | ICD-10-CM | POA: Diagnosis not present

## 2022-11-17 DIAGNOSIS — G20A1 Parkinson'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 dyskinesia, without mention of fluctuations: Secondary | ICD-10-CM | POA: Diagnosis not present

## 2022-11-17 DIAGNOSIS — I480 Paroxysmal atrial fibrillation: Secondary | ICD-10-CM | POA: Diagnosis not present

## 2022-11-17 DIAGNOSIS — E039 Hypothyroidism, unspecified: Secondary | ICD-10-CM | POA: Diagnosis not present

## 2022-11-17 DIAGNOSIS — R269 Unspecified abnormalities of gait and mobility: Secondary | ICD-10-CM | POA: Diagnosis not present

## 2022-11-17 DIAGNOSIS — E119 Type 2 diabetes mellitus without complications: Secondary | ICD-10-CM | POA: Diagnosis not present

## 2022-11-17 DIAGNOSIS — I671 Cerebral aneurysm, nonruptured: Secondary | ICD-10-CM | POA: Diagnosis not present

## 2022-11-17 DIAGNOSIS — R569 Unspecified convulsions: Secondary | ICD-10-CM | POA: Diagnosis not present

## 2022-11-19 DIAGNOSIS — E785 Hyperlipidemia, unspecified: Secondary | ICD-10-CM | POA: Diagnosis not present

## 2022-11-19 DIAGNOSIS — I480 Paroxysmal atrial fibrillation: Secondary | ICD-10-CM | POA: Diagnosis not present

## 2022-11-19 DIAGNOSIS — I10 Essential (primary) hypertension: Secondary | ICD-10-CM | POA: Diagnosis not present

## 2022-11-19 DIAGNOSIS — E119 Type 2 diabetes mellitus without complications: Secondary | ICD-10-CM | POA: Diagnosis not present

## 2022-11-20 ENCOUNTER — Emergency Department (HOSPITAL_COMMUNITY): Payer: Medicare Other

## 2022-11-20 ENCOUNTER — Other Ambulatory Visit: Payer: Self-pay

## 2022-11-20 ENCOUNTER — Encounter (HOSPITAL_COMMUNITY): Payer: Self-pay | Admitting: Emergency Medicine

## 2022-11-20 ENCOUNTER — Emergency Department (HOSPITAL_COMMUNITY)
Admission: EM | Admit: 2022-11-20 | Discharge: 2022-11-21 | Disposition: A | Discharge status: Home / Self Care | Payer: Medicare Other | Attending: Emergency Medicine | Admitting: Emergency Medicine

## 2022-11-20 DIAGNOSIS — K5790 Diverticulosis of intestine, part unspecified, without perforation or abscess without bleeding: Secondary | ICD-10-CM | POA: Insufficient documentation

## 2022-11-20 DIAGNOSIS — W19XXXA Unspecified fall, initial encounter: Secondary | ICD-10-CM

## 2022-11-20 DIAGNOSIS — M542 Cervicalgia: Secondary | ICD-10-CM | POA: Insufficient documentation

## 2022-11-20 DIAGNOSIS — I671 Cerebral aneurysm, nonruptured: Secondary | ICD-10-CM | POA: Diagnosis not present

## 2022-11-20 DIAGNOSIS — Z7902 Long term (current) use of antithrombotics/antiplatelets: Secondary | ICD-10-CM | POA: Insufficient documentation

## 2022-11-20 DIAGNOSIS — Z7982 Long term (current) use of aspirin: Secondary | ICD-10-CM | POA: Insufficient documentation

## 2022-11-20 DIAGNOSIS — S0101XA Laceration without foreign body of scalp, initial encounter: Secondary | ICD-10-CM | POA: Diagnosis not present

## 2022-11-20 DIAGNOSIS — N281 Cyst of kidney, acquired: Secondary | ICD-10-CM | POA: Diagnosis not present

## 2022-11-20 DIAGNOSIS — Z23 Encounter for immunization: Secondary | ICD-10-CM | POA: Diagnosis not present

## 2022-11-20 DIAGNOSIS — S2243XA Multiple fractures of ribs, bilateral, initial encounter for closed fracture: Secondary | ICD-10-CM | POA: Diagnosis not present

## 2022-11-20 DIAGNOSIS — S3993XA Unspecified injury of pelvis, initial encounter: Secondary | ICD-10-CM | POA: Diagnosis not present

## 2022-11-20 DIAGNOSIS — R079 Chest pain, unspecified: Secondary | ICD-10-CM | POA: Diagnosis not present

## 2022-11-20 DIAGNOSIS — I10 Essential (primary) hypertension: Secondary | ICD-10-CM | POA: Diagnosis not present

## 2022-11-20 DIAGNOSIS — R58 Hemorrhage, not elsewhere classified: Secondary | ICD-10-CM | POA: Diagnosis not present

## 2022-11-20 DIAGNOSIS — F0390 Unspecified dementia without behavioral disturbance: Secondary | ICD-10-CM | POA: Insufficient documentation

## 2022-11-20 DIAGNOSIS — Z743 Need for continuous supervision: Secondary | ICD-10-CM | POA: Diagnosis not present

## 2022-11-20 DIAGNOSIS — R6889 Other general symptoms and signs: Secondary | ICD-10-CM | POA: Diagnosis not present

## 2022-11-20 DIAGNOSIS — W06XXXA Fall from bed, initial encounter: Secondary | ICD-10-CM | POA: Diagnosis not present

## 2022-11-20 DIAGNOSIS — S0990XA Unspecified injury of head, initial encounter: Secondary | ICD-10-CM | POA: Diagnosis not present

## 2022-11-20 DIAGNOSIS — Z043 Encounter for examination and observation following other accident: Secondary | ICD-10-CM | POA: Diagnosis not present

## 2022-11-20 DIAGNOSIS — I517 Cardiomegaly: Secondary | ICD-10-CM | POA: Insufficient documentation

## 2022-11-20 DIAGNOSIS — G4489 Other headache syndrome: Secondary | ICD-10-CM | POA: Diagnosis not present

## 2022-11-20 LAB — COMPREHENSIVE METABOLIC PANEL
ALT: 5 U/L (ref 0–44)
AST: 16 U/L (ref 15–41)
Albumin: 3.1 g/dL — ABNORMAL LOW (ref 3.5–5.0)
Alkaline Phosphatase: 97 U/L (ref 38–126)
Anion gap: 10 (ref 5–15)
BUN: 9 mg/dL (ref 8–23)
CO2: 23 mmol/L (ref 22–32)
Calcium: 8.4 mg/dL — ABNORMAL LOW (ref 8.9–10.3)
Chloride: 101 mmol/L (ref 98–111)
Creatinine, Ser: 0.86 mg/dL (ref 0.61–1.24)
GFR, Estimated: >60 mL/min (ref >60)
Glucose, Bld: 93 mg/dL (ref 70–99)
Potassium: 3.7 mmol/L (ref 3.5–5.1)
Sodium: 134 mmol/L — ABNORMAL LOW (ref 135–145)
Total Bilirubin: 0.9 mg/dL (ref 0.3–1.2)
Total Protein: 6.6 g/dL (ref 6.5–8.1)

## 2022-11-20 LAB — CBC
HCT: 40.6 % (ref 39.0–52.0)
Hemoglobin: 13.3 g/dL (ref 13.0–17.0)
MCH: 28.5 pg (ref 26.0–34.0)
MCHC: 32.8 g/dL (ref 30.0–36.0)
MCV: 87.1 fL (ref 80.0–100.0)
Platelets: 243 10*3/uL (ref 150–400)
RBC: 4.66 MIL/uL (ref 4.22–5.81)
RDW: 13.4 % (ref 11.5–15.5)
WBC: 8.2 10*3/uL (ref 4.0–10.5)
nRBC: 0 % (ref 0.0–0.2)

## 2022-11-20 MED ORDER — TETANUS-DIPHTH-ACELL PERTUSSIS 5-2.5-18.5 LF-MCG/0.5 IM SUSY
0.5000 mL | PREFILLED_SYRINGE | Freq: Once | INTRAMUSCULAR | Status: AC
  Administered 2022-11-20: 0.5 mL via INTRAMUSCULAR
  Filled 2022-11-20: qty 0.5

## 2022-11-20 MED ORDER — LIDOCAINE-EPINEPHRINE-TETRACAINE (LET) TOPICAL GEL
3.0000 mL | Freq: Once | TOPICAL | Status: AC
  Administered 2022-11-20: 3 mL via TOPICAL
  Filled 2022-11-20: qty 3

## 2022-11-20 MED ORDER — IOHEXOL 350 MG/ML SOLN
75.0000 mL | Freq: Once | INTRAVENOUS | Status: AC | PRN
  Administered 2022-11-20: 75 mL via INTRAVENOUS

## 2022-11-20 NOTE — ED Notes (Signed)
2L o2 applied Doon.

## 2022-11-20 NOTE — ED Notes (Signed)
LET in at 2233

## 2022-11-20 NOTE — ED Notes (Signed)
Pt transported to CT with TRN and RN.

## 2022-11-20 NOTE — ED Triage Notes (Signed)
Pt BIB EMS from ALF due to unwitnessed fall out of bed. Wearing c Collar, Pt takes Plavix. Pt also fell oob this morning onto mattress. Pt endorses pain to head and neck, laceration on L forehead Hx: dementia EMS VS: 176/80, P:58, 99%, GCS: 14 (baseline)

## 2022-11-20 NOTE — Progress Notes (Signed)
Orthopedic Tech Progress Note Patient Details:  Gerald Leach 01/07/1934 [id number]  Patient ID: [name], male   DOB: 1934/01/24, 89 y.o.   MRN: [id number] Level 2 trauma not needed at the moment. Rooney Swails L Jolane Bankhead 11/20/2022, 10:09 PM

## 2022-11-20 NOTE — ED Notes (Signed)
Patient transported to CT 

## 2022-11-20 NOTE — ED Notes (Signed)
Trauma Response Nurse Documentation   ADON SENN is a 89 y.o. male arriving to [facility] ED via [facility]  On No antithrombotic. Trauma was activated as a Level 2 by Beverely Risen based on the following trauma criteria Elderly patients > 65 with head trauma on anti-coagulation (excluding ASA).  Patient cleared for CT by Dr. Durwin Nora. Pt transported to CT with trauma response nurse present to monitor. RN remained with the patient throughout their absence from the department for clinical observation.   GCS 14 (baseline for patient).  History   Past Medical History:  Diagnosis Date   Brain aneurysm    Colonic polyp 2003   Dementia (HCC)    Diabetes mellitus without complication [facility])    ED (erectile dysfunction)    GERD (gastroesophageal reflux disease)    Hyperlipidemia    Hypertension    Hypothyroidism    Seizures (HCC)    per family   Seizures [facility])      Past Surgical History:  Procedure Laterality Date   Aneurysmal clipping     brain aneurysm surgery     at [facility]   carotid artery aneurysm     COLONOSCOPY     polyps   CRANIOTOMY Right 09/19/2012   Procedure: CRANIOTOMY HEMATOMA EVACUATION SUBDURAL;  Surgeon: Clydene Fake, MD;  Location: [facility] NEURO ORS;  Service: Neurosurgery;  Laterality: Right;   CRANIOTOMY Right 09/22/2012   Procedure: CRANIOTOMY HEMATOMA EVACUATION SUBDURAL;  Surgeon: Clydene Fake, MD;  Location: [facility] NEURO ORS;  Service: Neurosurgery;  Laterality: Right;   Craniotomy.     right hernia         Initial Focused Assessment (If applicable, or please see trauma documentation): Airway-- intact, no obstruction Breathing-- spontaneous, unlabored Circulation-- laceration to left forehead, bleeding controlled on arrival to department  CT's Completed:   CT Head and CT C-Spine   Interventions:  See event summary  Plan for disposition:  {Trauma Dispo:26867}   Consults completed:  {Trauma Consults:26862} at ***.  Event  Summary: Patient brought in by [facility] Patient rolled out of bed at facility this evening. Patient arrives with laceration to left forehead, bleeding controlled on arrival. GCS 14, baseline for the patient. Manual BP obtained. 20 G PIV RAC established. Trauma labs obtained. Xray chest and pelvis completed. Patient to CT with TRN and primary RN. CT head and c-spine completed.  MTP Summary (If applicable):  N/A  Bedside handoff with ED RN [facility].    Leota Sauers  Trauma Response RN  Please call TRN at [phone] for further assistance.

## 2022-11-20 NOTE — ED Provider Notes (Incomplete)
EMERGENCY DEPARTMENT AT [facility] Provider Note   CSN: [id number] Arrival date & time: 11/20/22  2156     History {Add pertinent medical, surgical, social history, OB history to HPI:1} No chief complaint on file.   Gerald Leach is a 89 y.o. male.  HPI Patient presents after a fall.  Medical history includes HLD, GERD, seizures, spinal stenosis, dementia, atrial fibrillation.  He is at [facility] nursing facility due to his dementia.  He does have frequent falls.  Earlier today, he had a fall out of bed onto a mattress that they had positioned on the floor.  This evening, he had a fall out of bed onto the side without the mattress.  He was found on the ground next to his bed.  He was able to get back into the bed under his own power.  EMS was called due to concern of injury from his unwitnessed fall.  He was noted to have a laceration to frontal scalp.  He complains of headache and neck pain.  He is on Plavix.    Home Medications Prior to Admission medications   Medication Sig Start Date End Date Taking? Authorizing Provider  acetaminophen (TYLENOL) 325 MG tablet Take 2 tablets (650 mg total) by mouth every 6 (six) hours as needed for mild pain (or Fever >/= 101). 03/24/21   [name], DO  amantadine (SYMMETREL) 100 MG capsule Take 1 capsule (100 mg total) by mouth daily. 05/18/21   Nita Sickle K, DO  amiodarone (PACERONE) 200 MG tablet Take 1 tablet (200 mg total) by mouth daily. 09/15/21   Erick Blinks, MD  aspirin EC 81 MG EC tablet Take 1 tablet (81 mg total) by mouth daily. Swallow whole. 09/15/21   Erick Blinks, MD  atorvastatin (LIPITOR) 40 MG tablet Take 1 tablet (40 mg total) by mouth daily. 09/15/21   Erick Blinks, MD  carbidopa-levodopa (SINEMET IR) 25-100 MG tablet TAKE 1 TABLET BY MOUTH THREE TIMES A DAY Patient taking differently: Take 1 tablet by mouth 3 (three) times daily. 07/04/21   Nita Sickle K, DO  clopidogrel (PLAVIX) 75 MG  tablet Take 1 tablet (75 mg total) by mouth daily. 09/15/21   Erick Blinks, MD  ferrous sulfate 325 (65 FE) MG tablet Take 325 mg by mouth daily with breakfast.    [provider]  Lacosamide 150 MG TABS Take 1 tablet (150 mg total) by mouth 2 (two) times daily. 05/31/21   Patel, Donika K, DO  levETIRAcetam (KEPPRA) 750 MG tablet TAKE 2 TABLETS (1,500 MG TOTAL) BY MOUTH 2 (TWO) TIMES DAILY. 06/26/21   Nita Sickle K, DO  levothyroxine (SYNTHROID) 50 MCG tablet Take 50 mcg by mouth every morning. On an empty stomach 03/21/22   [provider]  levothyroxine (SYNTHROID) 75 MCG tablet TAKE 1 TABLET BY MOUTH EVERY DAY 07/04/21   Nafziger, Kandee Keen, NP  LORazepam (ATIVAN) 1 MG tablet Take 1 tablet (1 mg total) by mouth every 6 (six) hours as needed for anxiety (dyspnea). 09/14/21   Erick Blinks, MD  metFORMIN (GLUCOPHAGE) 500 MG tablet Take 500 mg by mouth 2 (two) times daily. 11/01/21   [provider]  nitroGLYCERIN (NITROSTAT) 0.4 MG SL tablet Place 1 tablet (0.4 mg total) under the tongue every 5 (five) minutes x 3 doses as needed for chest pain. 09/14/21   Erick Blinks, MD  omeprazole (PRILOSEC) 20 MG capsule TAKE 1 CAPSULE BY MOUTH EVERY DAY Patient taking differently: Take 20 mg  by mouth daily. 05/10/21   Nafziger, Kandee Keen, NP  ondansetron (ZOFRAN) 4 MG tablet Take 1 tablet (4 mg total) by mouth every 6 (six) hours as needed for nausea. 03/24/21   [name], DO  vitamin B-12 (CYANOCOBALAMIN) 1000 MCG tablet Take 1,000 mcg by mouth daily.    [provider]      Allergies    Patient has no known allergies.    Review of Systems   Review of Systems  Unable to perform ROS: Dementia    Physical Exam Updated Vital Signs There were no vitals taken for this visit. Physical Exam Vitals and nursing note reviewed.  Constitutional:      General: He is not in acute distress.    Appearance: Normal appearance. He is well-developed. He is not ill-appearing,  toxic-appearing or diaphoretic.  HENT:     Head: Normocephalic.     Comments: Hemostatic wound to midline frontal scalp    Right Ear: External ear normal.     Left Ear: External ear normal.     Nose: Nose normal.     Mouth/Throat:     Mouth: Mucous membranes are moist.  Eyes:     Extraocular Movements: Extraocular movements intact.     Conjunctiva/sclera: Conjunctivae normal.  Neck:     Comments: Cervical collar in place Cardiovascular:     Rate and Rhythm: Normal rate and regular rhythm.     Heart sounds: No murmur heard. Pulmonary:     Effort: Pulmonary effort is normal. No respiratory distress.     Breath sounds: No wheezing or rales.  Chest:     Chest wall: No tenderness.  Abdominal:     General: There is no distension.     Palpations: Abdomen is soft.     Tenderness: There is no abdominal tenderness.  Musculoskeletal:        General: No swelling, tenderness or deformity. Normal range of motion.     Cervical back: Neck supple.     Right lower leg: No edema.     Left lower leg: No edema.  Skin:    General: Skin is warm and dry.     Coloration: Skin is not jaundiced or pale.  Neurological:     General: No focal deficit present.     Mental Status: He is alert. Mental status is at baseline. He is disoriented.  Psychiatric:        Mood and Affect: Mood normal.        Behavior: Behavior normal.     ED Results / Procedures / Treatments   Labs (all labs ordered are listed, but only abnormal results are displayed) Labs Reviewed - No data to display  EKG None  Radiology No results found.  Procedures Procedures  {Document cardiac monitor, telemetry assessment procedure when appropriate:1}  Medications Ordered in ED Medications - No data to display  ED Course/ Medical Decision Making/ A&P   {   Click here for ABCD2, HEART and other calculatorsREFRESH Note before signing :1}                          Medical Decision Making  This patient presents to the ED  for concern of ***, this involves an extensive number of treatment options, and is a complaint that carries with it a high risk of complications and morbidity.  The differential diagnosis includes ***   Co morbidities that complicate the patient evaluation  ***   Additional  history obtained:  Additional history obtained from *** External records from outside source obtained and reviewed including ***   Lab Tests:  I Ordered, and personally interpreted labs.  The pertinent results include:  ***   Imaging Studies ordered:  I ordered imaging studies including ***  I independently visualized and interpreted imaging which showed *** I agree with the radiologist interpretation   Cardiac Monitoring: / EKG:  The patient was maintained on a cardiac monitor.  I personally viewed and interpreted the cardiac monitored which showed an underlying rhythm of: ***   Consultations Obtained:  I requested consultation with the ***,  and discussed lab and imaging findings as well as pertinent plan - they recommend: ***   Problem List / ED Course / Critical interventions / Medication management  *** I ordered medication including ***  for ***  Reevaluation of the patient after these medicines showed that the patient {resolved/improved/worsened:23923::"improved"} I have reviewed the patients home medicines and have made adjustments as needed   Social Determinants of Health:  ***   Test / Admission - Considered:  ***   {Document critical care time when appropriate:1} {Document review of labs and clinical decision tools ie heart score, Chads2Vasc2 etc:1}  {Document your independent review of radiology images, and any outside records:1} {Document your discussion with family members, caretakers, and with consultants:1} {Document social determinants of health affecting pt's care:1} {Document your decision making why or why not admission, treatments were needed:1} Final Clinical  Impression(s) / ED Diagnoses Final diagnoses:  None    Rx / DC Orders ED Discharge Orders     None

## 2022-11-20 NOTE — Progress Notes (Signed)
   11/20/22 2200  Spiritual Encounters  Type of Visit Initial  Care provided to: Pt not available  Referral source Trauma page  Reason for visit Trauma  OnCall Visit No   Chaplain responded to a level two trauma. The patient, Adnan, was attended to by the medical team. No family is present. If a chaplain is requested someone will respond.   Valerie Roys [facility]  [phone]

## 2022-11-20 NOTE — ED Provider Notes (Incomplete)
[place] EMERGENCY DEPARTMENT AT [facility] Provider Note   CSN: [id number] Arrival date & time: 11/20/22  2156     History {Add pertinent medical, surgical, social history, OB history to HPI:1} No chief complaint on file.   Gerald Leach is a 89 y.o. male.  HPI Patient presents after a fall.  Medical history includes HLD, GERD, seizures, spinal stenosis, dementia, atrial fibrillation.  He is at [facility] nursing facility due to his dementia.  He does have frequent falls.  Earlier today, he had a fall out of bed onto a mattress that they had positioned on the floor.  This evening, he had a fall out of bed onto the side without the mattress.  He was found on the ground next to his bed.  He was able to get back into the bed under his own power.  EMS was called due to concern of injury from his unwitnessed fall.  He was noted to have a laceration to frontal scalp.  He complains of headache and neck pain.  He is on Plavix.    Home Medications Prior to Admission medications   Medication Sig Start Date End Date Taking? Authorizing Provider  acetaminophen (TYLENOL) 325 MG tablet Take 2 tablets (650 mg total) by mouth every 6 (six) hours as needed for mild pain (or Fever >/= 101). 03/24/21   [name], DO  amantadine (SYMMETREL) 100 MG capsule Take 1 capsule (100 mg total) by mouth daily. 05/18/21   Nita Sickle K, DO  amiodarone (PACERONE) 200 MG tablet Take 1 tablet (200 mg total) by mouth daily. 09/15/21   Erick Blinks, MD  aspirin EC 81 MG EC tablet Take 1 tablet (81 mg total) by mouth daily. Swallow whole. 09/15/21   Erick Blinks, MD  atorvastatin (LIPITOR) 40 MG tablet Take 1 tablet (40 mg total) by mouth daily. 09/15/21   Erick Blinks, MD  carbidopa-levodopa (SINEMET IR) 25-100 MG tablet TAKE 1 TABLET BY MOUTH THREE TIMES A DAY Patient taking differently: Take 1 tablet by mouth 3 (three) times daily. 07/04/21   Nita Sickle K, DO  clopidogrel (PLAVIX) 75 MG  tablet Take 1 tablet (75 mg total) by mouth daily. 09/15/21   Erick Blinks, MD  ferrous sulfate 325 (65 FE) MG tablet Take 325 mg by mouth daily with breakfast.    [provider]  Lacosamide 150 MG TABS Take 1 tablet (150 mg total) by mouth 2 (two) times daily. 05/31/21   Patel, Donika K, DO  levETIRAcetam (KEPPRA) 750 MG tablet TAKE 2 TABLETS (1,500 MG TOTAL) BY MOUTH 2 (TWO) TIMES DAILY. 06/26/21   Nita Sickle K, DO  levothyroxine (SYNTHROID) 50 MCG tablet Take 50 mcg by mouth every morning. On an empty stomach 03/21/22   [provider]  levothyroxine (SYNTHROID) 75 MCG tablet TAKE 1 TABLET BY MOUTH EVERY DAY 07/04/21   Nafziger, Kandee Keen, NP  LORazepam (ATIVAN) 1 MG tablet Take 1 tablet (1 mg total) by mouth every 6 (six) hours as needed for anxiety (dyspnea). 09/14/21   Erick Blinks, MD  metFORMIN (GLUCOPHAGE) 500 MG tablet Take 500 mg by mouth 2 (two) times daily. 11/01/21   [provider]  nitroGLYCERIN (NITROSTAT) 0.4 MG SL tablet Place 1 tablet (0.4 mg total) under the tongue every 5 (five) minutes x 3 doses as needed for chest pain. 09/14/21   Erick Blinks, MD  omeprazole (PRILOSEC) 20 MG capsule TAKE 1 CAPSULE BY MOUTH EVERY DAY Patient taking differently: Take 20 mg  by mouth daily. 05/10/21   Nafziger, Kandee Keen, NP  ondansetron (ZOFRAN) 4 MG tablet Take 1 tablet (4 mg total) by mouth every 6 (six) hours as needed for nausea. 03/24/21   [name], DO  vitamin B-12 (CYANOCOBALAMIN) 1000 MCG tablet Take 1,000 mcg by mouth daily.    [provider]      Allergies    Patient has no known allergies.    Review of Systems   Review of Systems  Unable to perform ROS: Dementia    Physical Exam Updated Vital Signs There were no vitals taken for this visit. Physical Exam Vitals and nursing note reviewed.  Constitutional:      General: He is not in acute distress.    Appearance: Normal appearance. He is well-developed. He is not ill-appearing,  toxic-appearing or diaphoretic.  HENT:     Head: Normocephalic.     Comments: Hemostatic wound to midline frontal scalp    Right Ear: External ear normal.     Left Ear: External ear normal.     Nose: Nose normal.     Mouth/Throat:     Mouth: Mucous membranes are moist.  Eyes:     Extraocular Movements: Extraocular movements intact.     Conjunctiva/sclera: Conjunctivae normal.  Neck:     Comments: Cervical collar in place Cardiovascular:     Rate and Rhythm: Normal rate and regular rhythm.     Heart sounds: No murmur heard. Pulmonary:     Effort: Pulmonary effort is normal. No respiratory distress.     Breath sounds: No wheezing or rales.  Chest:     Chest wall: No tenderness.  Abdominal:     General: There is no distension.     Palpations: Abdomen is soft.     Tenderness: There is no abdominal tenderness.  Musculoskeletal:        General: No swelling, tenderness or deformity. Normal range of motion.     Cervical back: Neck supple.     Right lower leg: No edema.     Left lower leg: No edema.  Skin:    General: Skin is warm and dry.     Coloration: Skin is not jaundiced or pale.  Neurological:     General: No focal deficit present.     Mental Status: He is alert. Mental status is at baseline. He is disoriented.  Psychiatric:        Mood and Affect: Mood normal.        Behavior: Behavior normal.     ED Results / Procedures / Treatments   Labs (all labs ordered are listed, but only abnormal results are displayed) Labs Reviewed - No data to display  EKG None  Radiology No results found.  Procedures Procedures  {Document cardiac monitor, telemetry assessment procedure when appropriate:1}  Medications Ordered in ED Medications - No data to display  ED Course/ Medical Decision Making/ A&P   {   Click here for ABCD2, HEART and other calculatorsREFRESH Note before signing :1}                          Medical Decision Making Amount and/or Complexity of Data  Reviewed Labs: ordered. Radiology: ordered.  Risk Prescription drug management.   This patient presents to the ED for concern of fall, this involves an extensive number of treatment options, and is a complaint that carries with it a high risk of complications and morbidity.  The differential diagnosis  includes acute injuries   Co morbidities that complicate the patient evaluation  HLD, GERD, seizures, spinal stenosis, dementia, atrial fibrillation   Additional history obtained:  Additional history obtained from EMS External records from outside source obtained and reviewed including EMR   Lab Tests:  I Ordered, and personally interpreted labs.  The pertinent results include: Normal hemoglobin, no leukocytosis, normal kidney function   Imaging Studies ordered:  I ordered imaging studies including x-ray of chest and pelvis, CT of head, cervical spine, chest, abdomen, pelvis I independently visualized and interpreted imaging which showed no acute findings I agree with the radiologist interpretation   Cardiac Monitoring: / EKG:  The patient was maintained on a cardiac monitor.  I personally viewed and interpreted the cardiac monitored which showed an underlying rhythm of: ***   Consultations Obtained:  I requested consultation with the ***,  and discussed lab and imaging findings as well as pertinent plan - they recommend: ***   Problem List / ED Course / Critical interventions / Medication management  *** I ordered medication including ***  for ***  Reevaluation of the patient after these medicines showed that the patient {resolved/improved/worsened:23923::"improved"} I have reviewed the patients home medicines and have made adjustments as needed   Social Determinants of Health:  ***   Test / Admission - Considered:  ***   {Document critical care time when appropriate:1} {Document review of labs and clinical decision tools ie heart score, Chads2Vasc2 etc:1}   {Document your independent review of radiology images, and any outside records:1} {Document your discussion with family members, caretakers, and with consultants:1} {Document social determinants of health affecting pt's care:1} {Document your decision making why or why not admission, treatments were needed:1} Final Clinical Impression(s) / ED Diagnoses Final diagnoses:  None    Rx / DC Orders ED Discharge Orders     None

## 2022-11-21 DIAGNOSIS — Z7401 Bed confinement status: Secondary | ICD-10-CM | POA: Diagnosis not present

## 2022-11-21 DIAGNOSIS — W19XXXD Unspecified fall, subsequent encounter: Secondary | ICD-10-CM | POA: Diagnosis not present

## 2022-11-21 DIAGNOSIS — I10 Essential (primary) hypertension: Secondary | ICD-10-CM | POA: Diagnosis not present

## 2022-11-21 DIAGNOSIS — I480 Paroxysmal atrial fibrillation: Secondary | ICD-10-CM | POA: Diagnosis not present

## 2022-11-21 DIAGNOSIS — E785 Hyperlipidemia, unspecified: Secondary | ICD-10-CM | POA: Diagnosis not present

## 2022-11-21 DIAGNOSIS — R404 Transient alteration of awareness: Secondary | ICD-10-CM | POA: Diagnosis not present

## 2022-11-21 DIAGNOSIS — E039 Hypothyroidism, unspecified: Secondary | ICD-10-CM | POA: Diagnosis not present

## 2022-11-21 LAB — URINALYSIS, ROUTINE W REFLEX MICROSCOPIC
Bilirubin Urine: NEGATIVE
Glucose, UA: NEGATIVE mg/dL
Hgb urine dipstick: NEGATIVE
Ketones, ur: 5 mg/dL — AB
Leukocytes,Ua: NEGATIVE
Nitrite: NEGATIVE
Protein, ur: NEGATIVE mg/dL
Specific Gravity, Urine: 1.024 (ref 1.005–1.030)
pH: 6 (ref 5.0–8.0)

## 2022-11-21 MED ORDER — HALOPERIDOL LACTATE 5 MG/ML IJ SOLN
2.0000 mg | Freq: Once | INTRAMUSCULAR | Status: AC
  Administered 2022-11-21: 2 mg via INTRAVENOUS
  Filled 2022-11-21: qty 1

## 2022-11-21 MED ORDER — ACETAMINOPHEN 325 MG PO TABS: 650.0000 mg | ORAL_TABLET | Freq: Once | ORAL | Status: DC

## 2022-11-21 MED ORDER — LIDOCAINE HCL (PF) 1 % IJ SOLN
INTRAMUSCULAR | Status: AC
  Administered 2022-11-21: 5 mL via INTRADERMAL
  Filled 2022-11-21: qty 5

## 2022-11-21 NOTE — ED Notes (Signed)
[facility] called, [facility] stated "their first on the list"

## 2022-11-21 NOTE — ED Provider Notes (Signed)
..  Laceration Repair  Date/Time: 11/21/2022 1:40 AM  Performed by: Carroll Sage, PA-C Authorized by: Carroll Sage, PA-C   Anesthesia:    Anesthesia method:  Local infiltration   Local anesthetic:  Lidocaine 1% w/o epi Laceration details:    Location:  Scalp   Scalp location:  Frontal   Length (cm):  2   Depth (mm):  2 Exploration:    Imaging outcome: foreign body not noted     Wound exploration: wound explored through full range of motion and entire depth of wound visualized     Contaminated: no   Treatment:    Amount of cleaning:  Standard   Irrigation solution:  Sterile saline Skin repair:    Repair method:  Sutures   Suture size:  5-0   Suture material:  Prolene   Suture technique:  Simple interrupted   Number of sutures:  4 Approximation:    Approximation:  Loose Repair type:    Repair type:  Simple Post-procedure details:    Dressing:  Bulky dressing   Procedure completion:  Tolerated well, no immediate complications     Carroll Sage, PA-C 11/21/22 0142    Gilda Crease, MD 11/21/22 [phone]

## 2022-11-21 NOTE — Discharge Instructions (Addendum)
Sutures need to be removed in 10 days.  °

## 2022-11-22 DIAGNOSIS — D649 Anemia, unspecified: Secondary | ICD-10-CM | POA: Diagnosis not present

## 2022-11-22 DIAGNOSIS — G20A1 Parkinson'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 dyskinesia, without mention of fluctuations: Secondary | ICD-10-CM | POA: Diagnosis not present

## 2022-11-22 DIAGNOSIS — Z681 Body mass index (BMI) 19 or less, adult: Secondary | ICD-10-CM | POA: Diagnosis not present

## 2022-11-22 DIAGNOSIS — E119 Type 2 diabetes mellitus without complications: Secondary | ICD-10-CM | POA: Diagnosis not present

## 2022-11-22 DIAGNOSIS — L98499 Non-pressure chronic ulcer of skin of other sites with unspecified severity: Secondary | ICD-10-CM | POA: Diagnosis not present

## 2022-11-22 DIAGNOSIS — G20C Parkinsonism, unspecified: Secondary | ICD-10-CM | POA: Diagnosis not present

## 2022-11-23 DIAGNOSIS — W19XXXA Unspecified fall, initial encounter: Secondary | ICD-10-CM | POA: Diagnosis not present

## 2022-11-23 DIAGNOSIS — S065X9A Traumatic subdural hemorrhage with loss of consciousness of unspecified duration, initial encounter: Secondary | ICD-10-CM | POA: Diagnosis not present

## 2022-11-23 DIAGNOSIS — R569 Unspecified convulsions: Secondary | ICD-10-CM | POA: Diagnosis not present

## 2022-11-23 DIAGNOSIS — E039 Hypothyroidism, unspecified: Secondary | ICD-10-CM | POA: Diagnosis not present

## 2022-11-26 DIAGNOSIS — D649 Anemia, unspecified: Secondary | ICD-10-CM | POA: Diagnosis not present

## 2022-11-26 DIAGNOSIS — G20A1 Parkinson'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 dyskinesia, without mention of fluctuations: Secondary | ICD-10-CM | POA: Diagnosis not present

## 2022-11-26 DIAGNOSIS — E119 Type 2 diabetes mellitus without complications: Secondary | ICD-10-CM | POA: Diagnosis not present

## 2022-11-27 DIAGNOSIS — G20A1 Parkinson'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 dyskinesia, without mention of fluctuations: Secondary | ICD-10-CM | POA: Diagnosis not present

## 2022-11-27 DIAGNOSIS — E119 Type 2 diabetes mellitus without complications: Secondary | ICD-10-CM | POA: Diagnosis not present

## 2022-11-27 DIAGNOSIS — D649 Anemia, unspecified: Secondary | ICD-10-CM | POA: Diagnosis not present

## 2022-11-28 DIAGNOSIS — G20A1 Parkinson'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 dyskinesia, without mention of fluctuations: Secondary | ICD-10-CM | POA: Diagnosis not present

## 2022-11-28 DIAGNOSIS — E119 Type 2 diabetes mellitus without complications: Secondary | ICD-10-CM | POA: Diagnosis not present

## 2022-11-28 DIAGNOSIS — D649 Anemia, unspecified: Secondary | ICD-10-CM | POA: Diagnosis not present

## 2022-11-29 DIAGNOSIS — E119 Type 2 diabetes mellitus without complications: Secondary | ICD-10-CM | POA: Diagnosis not present

## 2022-11-29 DIAGNOSIS — D649 Anemia, unspecified: Secondary | ICD-10-CM | POA: Diagnosis not present

## 2022-11-29 DIAGNOSIS — L98499 Non-pressure chronic ulcer of skin of other sites with unspecified severity: Secondary | ICD-10-CM | POA: Diagnosis not present

## 2022-11-29 DIAGNOSIS — G20A1 Parkinson'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 dyskinesia, without mention of fluctuations: Secondary | ICD-10-CM | POA: Diagnosis not present

## 2022-11-29 DIAGNOSIS — Z681 Body mass index (BMI) 19 or less, adult: Secondary | ICD-10-CM | POA: Diagnosis not present

## 2022-11-29 DIAGNOSIS — G20C Parkinsonism, unspecified: Secondary | ICD-10-CM | POA: Diagnosis not present

## 2022-11-30 DIAGNOSIS — D649 Anemia, unspecified: Secondary | ICD-10-CM | POA: Diagnosis not present

## 2022-11-30 DIAGNOSIS — E119 Type 2 diabetes mellitus without complications: Secondary | ICD-10-CM | POA: Diagnosis not present

## 2022-11-30 DIAGNOSIS — G20A1 Parkinson'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 dyskinesia, without mention of fluctuations: Secondary | ICD-10-CM | POA: Diagnosis not present

## 2022-12-03 DIAGNOSIS — D649 Anemia, unspecified: Secondary | ICD-10-CM | POA: Diagnosis not present

## 2022-12-03 DIAGNOSIS — E119 Type 2 diabetes mellitus without complications: Secondary | ICD-10-CM | POA: Diagnosis not present

## 2022-12-03 DIAGNOSIS — G20A1 Parkinson'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 dyskinesia, without mention of fluctuations: Secondary | ICD-10-CM | POA: Diagnosis not present

## 2022-12-04 DIAGNOSIS — E119 Type 2 diabetes mellitus without complications: Secondary | ICD-10-CM | POA: Diagnosis not present

## 2022-12-04 DIAGNOSIS — G20A1 Parkinson'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 dyskinesia, without mention of fluctuations: Secondary | ICD-10-CM | POA: Diagnosis not present

## 2022-12-04 DIAGNOSIS — D649 Anemia, unspecified: Secondary | ICD-10-CM | POA: Diagnosis not present

## 2022-12-05 DIAGNOSIS — D649 Anemia, unspecified: Secondary | ICD-10-CM | POA: Diagnosis not present

## 2022-12-05 DIAGNOSIS — K219 Gastro-esophageal reflux disease without esophagitis: Secondary | ICD-10-CM | POA: Diagnosis not present

## 2022-12-05 DIAGNOSIS — I480 Paroxysmal atrial fibrillation: Secondary | ICD-10-CM | POA: Diagnosis not present

## 2022-12-05 DIAGNOSIS — E119 Type 2 diabetes mellitus without complications: Secondary | ICD-10-CM | POA: Diagnosis not present

## 2022-12-05 DIAGNOSIS — G20A1 Parkinson'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 dyskinesia, without mention of fluctuations: Secondary | ICD-10-CM | POA: Diagnosis not present

## 2022-12-05 DIAGNOSIS — I10 Essential (primary) hypertension: Secondary | ICD-10-CM | POA: Diagnosis not present

## 2022-12-06 DIAGNOSIS — Z681 Body mass index (BMI) 19 or less, adult: Secondary | ICD-10-CM | POA: Diagnosis not present

## 2022-12-06 DIAGNOSIS — L98499 Non-pressure chronic ulcer of skin of other sites with unspecified severity: Secondary | ICD-10-CM | POA: Diagnosis not present

## 2022-12-06 DIAGNOSIS — D649 Anemia, unspecified: Secondary | ICD-10-CM | POA: Diagnosis not present

## 2022-12-06 DIAGNOSIS — G20C Parkinsonism, unspecified: Secondary | ICD-10-CM | POA: Diagnosis not present

## 2022-12-06 DIAGNOSIS — G20A1 Parkinson'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 dyskinesia, without mention of fluctuations: Secondary | ICD-10-CM | POA: Diagnosis not present

## 2022-12-06 DIAGNOSIS — E119 Type 2 diabetes mellitus without complications: Secondary | ICD-10-CM | POA: Diagnosis not present

## 2022-12-07 DIAGNOSIS — E119 Type 2 diabetes mellitus without complications: Secondary | ICD-10-CM | POA: Diagnosis not present

## 2022-12-07 DIAGNOSIS — G20A1 Parkinson'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 dyskinesia, without mention of fluctuations: Secondary | ICD-10-CM | POA: Diagnosis not present

## 2022-12-07 DIAGNOSIS — D649 Anemia, unspecified: Secondary | ICD-10-CM | POA: Diagnosis not present

## 2022-12-10 DIAGNOSIS — G20A1 Parkinson'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 dyskinesia, without mention of fluctuations: Secondary | ICD-10-CM | POA: Diagnosis not present

## 2022-12-10 DIAGNOSIS — D649 Anemia, unspecified: Secondary | ICD-10-CM | POA: Diagnosis not present

## 2022-12-10 DIAGNOSIS — E119 Type 2 diabetes mellitus without complications: Secondary | ICD-10-CM | POA: Diagnosis not present

## 2022-12-11 DIAGNOSIS — I480 Paroxysmal atrial fibrillation: Secondary | ICD-10-CM | POA: Diagnosis not present

## 2022-12-11 DIAGNOSIS — E119 Type 2 diabetes mellitus without complications: Secondary | ICD-10-CM | POA: Diagnosis not present

## 2022-12-11 DIAGNOSIS — D649 Anemia, unspecified: Secondary | ICD-10-CM | POA: Diagnosis not present

## 2022-12-11 DIAGNOSIS — G20A1 Parkinson'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 dyskinesia, without mention of fluctuations: Secondary | ICD-10-CM | POA: Diagnosis not present

## 2022-12-11 DIAGNOSIS — K219 Gastro-esophageal reflux disease without esophagitis: Secondary | ICD-10-CM | POA: Diagnosis not present

## 2022-12-11 DIAGNOSIS — I10 Essential (primary) hypertension: Secondary | ICD-10-CM | POA: Diagnosis not present

## 2022-12-12 DIAGNOSIS — D649 Anemia, unspecified: Secondary | ICD-10-CM | POA: Diagnosis not present

## 2022-12-12 DIAGNOSIS — E119 Type 2 diabetes mellitus without complications: Secondary | ICD-10-CM | POA: Diagnosis not present

## 2022-12-12 DIAGNOSIS — G20A1 Parkinson'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 dyskinesia, without mention of fluctuations: Secondary | ICD-10-CM | POA: Diagnosis not present

## 2022-12-13 DIAGNOSIS — D649 Anemia, unspecified: Secondary | ICD-10-CM | POA: Diagnosis not present

## 2022-12-13 DIAGNOSIS — E119 Type 2 diabetes mellitus without complications: Secondary | ICD-10-CM | POA: Diagnosis not present

## 2022-12-13 DIAGNOSIS — G20A1 Parkinson'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 dyskinesia, without mention of fluctuations: Secondary | ICD-10-CM | POA: Diagnosis not present

## 2022-12-14 DIAGNOSIS — G20A1 Parkinson'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 dyskinesia, without mention of fluctuations: Secondary | ICD-10-CM | POA: Diagnosis not present

## 2022-12-14 DIAGNOSIS — E119 Type 2 diabetes mellitus without complications: Secondary | ICD-10-CM | POA: Diagnosis not present

## 2022-12-14 DIAGNOSIS — D649 Anemia, unspecified: Secondary | ICD-10-CM | POA: Diagnosis not present

## 2022-12-17 DIAGNOSIS — R2689 Other abnormalities of gait and mobility: Secondary | ICD-10-CM | POA: Diagnosis not present

## 2022-12-17 DIAGNOSIS — M6281 Muscle weakness (generalized): Secondary | ICD-10-CM | POA: Diagnosis not present

## 2022-12-17 DIAGNOSIS — D649 Anemia, unspecified: Secondary | ICD-10-CM | POA: Diagnosis not present

## 2022-12-17 DIAGNOSIS — Z9181 History of falling: Secondary | ICD-10-CM | POA: Diagnosis not present

## 2022-12-17 DIAGNOSIS — G20A1 Parkinson'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 dyskinesia, without mention of fluctuations: Secondary | ICD-10-CM | POA: Diagnosis not present

## 2022-12-17 DIAGNOSIS — R1312 Dysphagia, oropharyngeal phase: Secondary | ICD-10-CM | POA: Diagnosis not present

## 2022-12-17 DIAGNOSIS — E119 Type 2 diabetes mellitus without complications: Secondary | ICD-10-CM | POA: Diagnosis not present

## 2022-12-18 DIAGNOSIS — E119 Type 2 diabetes mellitus without complications: Secondary | ICD-10-CM | POA: Diagnosis not present

## 2022-12-18 DIAGNOSIS — M6281 Muscle weakness (generalized): Secondary | ICD-10-CM | POA: Diagnosis not present

## 2022-12-18 DIAGNOSIS — K219 Gastro-esophageal reflux disease without esophagitis: Secondary | ICD-10-CM | POA: Diagnosis not present

## 2022-12-18 DIAGNOSIS — R2689 Other abnormalities of gait and mobility: Secondary | ICD-10-CM | POA: Diagnosis not present

## 2022-12-18 DIAGNOSIS — S32050S Wedge compression fracture of fifth lumbar vertebra, sequela: Secondary | ICD-10-CM | POA: Diagnosis not present

## 2022-12-18 DIAGNOSIS — I480 Paroxysmal atrial fibrillation: Secondary | ICD-10-CM | POA: Diagnosis not present

## 2022-12-18 DIAGNOSIS — D649 Anemia, unspecified: Secondary | ICD-10-CM | POA: Diagnosis not present

## 2022-12-18 DIAGNOSIS — Z9181 History of falling: Secondary | ICD-10-CM | POA: Diagnosis not present

## 2022-12-18 DIAGNOSIS — G20A1 Parkinson'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 dyskinesia, without mention of fluctuations: Secondary | ICD-10-CM | POA: Diagnosis not present

## 2022-12-18 DIAGNOSIS — R1312 Dysphagia, oropharyngeal phase: Secondary | ICD-10-CM | POA: Diagnosis not present

## 2022-12-18 DIAGNOSIS — I10 Essential (primary) hypertension: Secondary | ICD-10-CM | POA: Diagnosis not present

## 2022-12-19 DIAGNOSIS — R1312 Dysphagia, oropharyngeal phase: Secondary | ICD-10-CM | POA: Diagnosis not present

## 2022-12-19 DIAGNOSIS — G20A1 Parkinson'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 dyskinesia, without mention of fluctuations: Secondary | ICD-10-CM | POA: Diagnosis not present

## 2022-12-19 DIAGNOSIS — Z9181 History of falling: Secondary | ICD-10-CM | POA: Diagnosis not present

## 2022-12-19 DIAGNOSIS — E119 Type 2 diabetes mellitus without complications: Secondary | ICD-10-CM | POA: Diagnosis not present

## 2022-12-19 DIAGNOSIS — D649 Anemia, unspecified: Secondary | ICD-10-CM | POA: Diagnosis not present

## 2022-12-19 DIAGNOSIS — M6281 Muscle weakness (generalized): Secondary | ICD-10-CM | POA: Diagnosis not present

## 2022-12-19 DIAGNOSIS — R2689 Other abnormalities of gait and mobility: Secondary | ICD-10-CM | POA: Diagnosis not present

## 2022-12-20 DIAGNOSIS — Z9181 History of falling: Secondary | ICD-10-CM | POA: Diagnosis not present

## 2022-12-20 DIAGNOSIS — E119 Type 2 diabetes mellitus without complications: Secondary | ICD-10-CM | POA: Diagnosis not present

## 2022-12-20 DIAGNOSIS — D649 Anemia, unspecified: Secondary | ICD-10-CM | POA: Diagnosis not present

## 2022-12-20 DIAGNOSIS — R1312 Dysphagia, oropharyngeal phase: Secondary | ICD-10-CM | POA: Diagnosis not present

## 2022-12-20 DIAGNOSIS — R2689 Other abnormalities of gait and mobility: Secondary | ICD-10-CM | POA: Diagnosis not present

## 2022-12-20 DIAGNOSIS — G20A1 Parkinson'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 dyskinesia, without mention of fluctuations: Secondary | ICD-10-CM | POA: Diagnosis not present

## 2022-12-20 DIAGNOSIS — M6281 Muscle weakness (generalized): Secondary | ICD-10-CM | POA: Diagnosis not present

## 2022-12-21 DIAGNOSIS — Z9181 History of falling: Secondary | ICD-10-CM | POA: Diagnosis not present

## 2022-12-21 DIAGNOSIS — E119 Type 2 diabetes mellitus without complications: Secondary | ICD-10-CM | POA: Diagnosis not present

## 2022-12-21 DIAGNOSIS — R2689 Other abnormalities of gait and mobility: Secondary | ICD-10-CM | POA: Diagnosis not present

## 2022-12-21 DIAGNOSIS — D649 Anemia, unspecified: Secondary | ICD-10-CM | POA: Diagnosis not present

## 2022-12-21 DIAGNOSIS — R1312 Dysphagia, oropharyngeal phase: Secondary | ICD-10-CM | POA: Diagnosis not present

## 2022-12-21 DIAGNOSIS — M6281 Muscle weakness (generalized): Secondary | ICD-10-CM | POA: Diagnosis not present

## 2022-12-21 DIAGNOSIS — G20A1 Parkinson'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 dyskinesia, without mention of fluctuations: Secondary | ICD-10-CM | POA: Diagnosis not present

## 2022-12-24 DIAGNOSIS — M6281 Muscle weakness (generalized): Secondary | ICD-10-CM | POA: Diagnosis not present

## 2022-12-24 DIAGNOSIS — E119 Type 2 diabetes mellitus without complications: Secondary | ICD-10-CM | POA: Diagnosis not present

## 2022-12-24 DIAGNOSIS — R1312 Dysphagia, oropharyngeal phase: Secondary | ICD-10-CM | POA: Diagnosis not present

## 2022-12-24 DIAGNOSIS — Z9181 History of falling: Secondary | ICD-10-CM | POA: Diagnosis not present

## 2022-12-24 DIAGNOSIS — R2689 Other abnormalities of gait and mobility: Secondary | ICD-10-CM | POA: Diagnosis not present

## 2022-12-24 DIAGNOSIS — D649 Anemia, unspecified: Secondary | ICD-10-CM | POA: Diagnosis not present

## 2022-12-24 DIAGNOSIS — G20A1 Parkinson'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 dyskinesia, without mention of fluctuations: Secondary | ICD-10-CM | POA: Diagnosis not present

## 2022-12-25 DIAGNOSIS — R1312 Dysphagia, oropharyngeal phase: Secondary | ICD-10-CM | POA: Diagnosis not present

## 2022-12-25 DIAGNOSIS — M6281 Muscle weakness (generalized): Secondary | ICD-10-CM | POA: Diagnosis not present

## 2022-12-25 DIAGNOSIS — D649 Anemia, unspecified: Secondary | ICD-10-CM | POA: Diagnosis not present

## 2022-12-25 DIAGNOSIS — Z9181 History of falling: Secondary | ICD-10-CM | POA: Diagnosis not present

## 2022-12-25 DIAGNOSIS — E119 Type 2 diabetes mellitus without complications: Secondary | ICD-10-CM | POA: Diagnosis not present

## 2022-12-25 DIAGNOSIS — R2689 Other abnormalities of gait and mobility: Secondary | ICD-10-CM | POA: Diagnosis not present

## 2022-12-25 DIAGNOSIS — G20A1 Parkinson'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 dyskinesia, without mention of fluctuations: Secondary | ICD-10-CM | POA: Diagnosis not present

## 2022-12-26 DIAGNOSIS — R1312 Dysphagia, oropharyngeal phase: Secondary | ICD-10-CM | POA: Diagnosis not present

## 2022-12-26 DIAGNOSIS — R2689 Other abnormalities of gait and mobility: Secondary | ICD-10-CM | POA: Diagnosis not present

## 2022-12-26 DIAGNOSIS — G20A1 Parkinson'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 dyskinesia, without mention of fluctuations: Secondary | ICD-10-CM | POA: Diagnosis not present

## 2022-12-26 DIAGNOSIS — M6281 Muscle weakness (generalized): Secondary | ICD-10-CM | POA: Diagnosis not present

## 2022-12-26 DIAGNOSIS — Z9181 History of falling: Secondary | ICD-10-CM | POA: Diagnosis not present

## 2022-12-26 DIAGNOSIS — E119 Type 2 diabetes mellitus without complications: Secondary | ICD-10-CM | POA: Diagnosis not present

## 2022-12-26 DIAGNOSIS — D649 Anemia, unspecified: Secondary | ICD-10-CM | POA: Diagnosis not present

## 2022-12-27 DIAGNOSIS — G20A1 Parkinson'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 dyskinesia, without mention of fluctuations: Secondary | ICD-10-CM | POA: Diagnosis not present

## 2022-12-27 DIAGNOSIS — Z9181 History of falling: Secondary | ICD-10-CM | POA: Diagnosis not present

## 2022-12-27 DIAGNOSIS — E119 Type 2 diabetes mellitus without complications: Secondary | ICD-10-CM | POA: Diagnosis not present

## 2022-12-27 DIAGNOSIS — R1312 Dysphagia, oropharyngeal phase: Secondary | ICD-10-CM | POA: Diagnosis not present

## 2022-12-27 DIAGNOSIS — R2689 Other abnormalities of gait and mobility: Secondary | ICD-10-CM | POA: Diagnosis not present

## 2022-12-27 DIAGNOSIS — D649 Anemia, unspecified: Secondary | ICD-10-CM | POA: Diagnosis not present

## 2022-12-27 DIAGNOSIS — M6281 Muscle weakness (generalized): Secondary | ICD-10-CM | POA: Diagnosis not present

## 2022-12-28 DIAGNOSIS — R569 Unspecified convulsions: Secondary | ICD-10-CM | POA: Diagnosis not present

## 2022-12-28 DIAGNOSIS — R2689 Other abnormalities of gait and mobility: Secondary | ICD-10-CM | POA: Diagnosis not present

## 2022-12-28 DIAGNOSIS — G20A1 Parkinson'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 dyskinesia, without mention of fluctuations: Secondary | ICD-10-CM | POA: Diagnosis not present

## 2022-12-28 DIAGNOSIS — R1312 Dysphagia, oropharyngeal phase: Secondary | ICD-10-CM | POA: Diagnosis not present

## 2022-12-28 DIAGNOSIS — Z9181 History of falling: Secondary | ICD-10-CM | POA: Diagnosis not present

## 2022-12-28 DIAGNOSIS — D649 Anemia, unspecified: Secondary | ICD-10-CM | POA: Diagnosis not present

## 2022-12-28 DIAGNOSIS — E119 Type 2 diabetes mellitus without complications: Secondary | ICD-10-CM | POA: Diagnosis not present

## 2022-12-28 DIAGNOSIS — E039 Hypothyroidism, unspecified: Secondary | ICD-10-CM | POA: Diagnosis not present

## 2022-12-28 DIAGNOSIS — S065X9A Traumatic subdural hemorrhage with loss of consciousness of unspecified duration, initial encounter: Secondary | ICD-10-CM | POA: Diagnosis not present

## 2022-12-28 DIAGNOSIS — M6281 Muscle weakness (generalized): Secondary | ICD-10-CM | POA: Diagnosis not present

## 2023-01-01 DIAGNOSIS — I480 Paroxysmal atrial fibrillation: Secondary | ICD-10-CM | POA: Diagnosis not present

## 2023-01-01 DIAGNOSIS — K219 Gastro-esophageal reflux disease without esophagitis: Secondary | ICD-10-CM | POA: Diagnosis not present

## 2023-01-01 DIAGNOSIS — I10 Essential (primary) hypertension: Secondary | ICD-10-CM | POA: Diagnosis not present

## 2023-01-07 DIAGNOSIS — E119 Type 2 diabetes mellitus without complications: Secondary | ICD-10-CM | POA: Diagnosis not present

## 2023-01-07 DIAGNOSIS — I480 Paroxysmal atrial fibrillation: Secondary | ICD-10-CM | POA: Diagnosis not present

## 2023-01-07 DIAGNOSIS — K219 Gastro-esophageal reflux disease without esophagitis: Secondary | ICD-10-CM | POA: Diagnosis not present

## 2023-01-07 DIAGNOSIS — I10 Essential (primary) hypertension: Secondary | ICD-10-CM | POA: Diagnosis not present

## 2023-01-25 DIAGNOSIS — E039 Hypothyroidism, unspecified: Secondary | ICD-10-CM | POA: Diagnosis not present

## 2023-01-25 DIAGNOSIS — R569 Unspecified convulsions: Secondary | ICD-10-CM | POA: Diagnosis not present

## 2023-01-25 DIAGNOSIS — I10 Essential (primary) hypertension: Secondary | ICD-10-CM | POA: Diagnosis not present

## 2023-01-25 DIAGNOSIS — D649 Anemia, unspecified: Secondary | ICD-10-CM | POA: Diagnosis not present

## 2023-01-25 DIAGNOSIS — E119 Type 2 diabetes mellitus without complications: Secondary | ICD-10-CM | POA: Diagnosis not present

## 2023-01-26 DIAGNOSIS — D649 Anemia, unspecified: Secondary | ICD-10-CM | POA: Diagnosis not present

## 2023-01-26 DIAGNOSIS — I10 Essential (primary) hypertension: Secondary | ICD-10-CM | POA: Diagnosis not present

## 2023-01-26 DIAGNOSIS — E119 Type 2 diabetes mellitus without complications: Secondary | ICD-10-CM | POA: Diagnosis not present

## 2023-01-29 DIAGNOSIS — I10 Essential (primary) hypertension: Secondary | ICD-10-CM | POA: Diagnosis not present

## 2023-01-29 DIAGNOSIS — E119 Type 2 diabetes mellitus without complications: Secondary | ICD-10-CM | POA: Diagnosis not present

## 2023-01-29 DIAGNOSIS — E039 Hypothyroidism, unspecified: Secondary | ICD-10-CM | POA: Diagnosis not present

## 2023-01-29 DIAGNOSIS — K219 Gastro-esophageal reflux disease without esophagitis: Secondary | ICD-10-CM | POA: Diagnosis not present

## 2023-01-29 DIAGNOSIS — I480 Paroxysmal atrial fibrillation: Secondary | ICD-10-CM | POA: Diagnosis not present

## 2023-01-31 DIAGNOSIS — I480 Paroxysmal atrial fibrillation: Secondary | ICD-10-CM | POA: Diagnosis not present

## 2023-01-31 DIAGNOSIS — I10 Essential (primary) hypertension: Secondary | ICD-10-CM | POA: Diagnosis not present

## 2023-01-31 DIAGNOSIS — G20A1 Parkinson'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 dyskinesia, without mention of fluctuations: Secondary | ICD-10-CM | POA: Diagnosis not present

## 2023-02-07 DIAGNOSIS — W19XXXD Unspecified fall, subsequent encounter: Secondary | ICD-10-CM | POA: Diagnosis not present

## 2023-02-07 DIAGNOSIS — E119 Type 2 diabetes mellitus without complications: Secondary | ICD-10-CM | POA: Diagnosis not present

## 2023-02-07 DIAGNOSIS — I10 Essential (primary) hypertension: Secondary | ICD-10-CM | POA: Diagnosis not present

## 2023-02-07 DIAGNOSIS — I480 Paroxysmal atrial fibrillation: Secondary | ICD-10-CM | POA: Diagnosis not present

## 2023-02-07 DIAGNOSIS — K219 Gastro-esophageal reflux disease without esophagitis: Secondary | ICD-10-CM | POA: Diagnosis not present

## 2023-02-19 DIAGNOSIS — G20A1 Parkinson'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 dyskinesia, without mention of fluctuations: Secondary | ICD-10-CM | POA: Diagnosis not present

## 2023-02-19 DIAGNOSIS — E119 Type 2 diabetes mellitus without complications: Secondary | ICD-10-CM | POA: Diagnosis not present

## 2023-02-19 DIAGNOSIS — M6281 Muscle weakness (generalized): Secondary | ICD-10-CM | POA: Diagnosis not present

## 2023-02-19 DIAGNOSIS — R2689 Other abnormalities of gait and mobility: Secondary | ICD-10-CM | POA: Diagnosis not present

## 2023-02-19 DIAGNOSIS — D649 Anemia, unspecified: Secondary | ICD-10-CM | POA: Diagnosis not present

## 2023-02-19 DIAGNOSIS — R1312 Dysphagia, oropharyngeal phase: Secondary | ICD-10-CM | POA: Diagnosis not present

## 2023-02-19 DIAGNOSIS — Z9181 History of falling: Secondary | ICD-10-CM | POA: Diagnosis not present

## 2023-02-21 DIAGNOSIS — R1312 Dysphagia, oropharyngeal phase: Secondary | ICD-10-CM | POA: Diagnosis not present

## 2023-02-21 DIAGNOSIS — M6281 Muscle weakness (generalized): Secondary | ICD-10-CM | POA: Diagnosis not present

## 2023-02-21 DIAGNOSIS — K219 Gastro-esophageal reflux disease without esophagitis: Secondary | ICD-10-CM | POA: Diagnosis not present

## 2023-02-21 DIAGNOSIS — D649 Anemia, unspecified: Secondary | ICD-10-CM | POA: Diagnosis not present

## 2023-02-21 DIAGNOSIS — I10 Essential (primary) hypertension: Secondary | ICD-10-CM | POA: Diagnosis not present

## 2023-02-21 DIAGNOSIS — I480 Paroxysmal atrial fibrillation: Secondary | ICD-10-CM | POA: Diagnosis not present

## 2023-02-21 DIAGNOSIS — Z9181 History of falling: Secondary | ICD-10-CM | POA: Diagnosis not present

## 2023-02-21 DIAGNOSIS — G20A1 Parkinson'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 dyskinesia, without mention of fluctuations: Secondary | ICD-10-CM | POA: Diagnosis not present

## 2023-02-21 DIAGNOSIS — E119 Type 2 diabetes mellitus without complications: Secondary | ICD-10-CM | POA: Diagnosis not present

## 2023-02-21 DIAGNOSIS — R2689 Other abnormalities of gait and mobility: Secondary | ICD-10-CM | POA: Diagnosis not present

## 2023-02-22 DIAGNOSIS — R2689 Other abnormalities of gait and mobility: Secondary | ICD-10-CM | POA: Diagnosis not present

## 2023-02-22 DIAGNOSIS — M6281 Muscle weakness (generalized): Secondary | ICD-10-CM | POA: Diagnosis not present

## 2023-02-22 DIAGNOSIS — R1312 Dysphagia, oropharyngeal phase: Secondary | ICD-10-CM | POA: Diagnosis not present

## 2023-02-22 DIAGNOSIS — G20A1 Parkinson'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 dyskinesia, without mention of fluctuations: Secondary | ICD-10-CM | POA: Diagnosis not present

## 2023-02-22 DIAGNOSIS — Z9181 History of falling: Secondary | ICD-10-CM | POA: Diagnosis not present

## 2023-02-22 DIAGNOSIS — E119 Type 2 diabetes mellitus without complications: Secondary | ICD-10-CM | POA: Diagnosis not present

## 2023-02-22 DIAGNOSIS — D649 Anemia, unspecified: Secondary | ICD-10-CM | POA: Diagnosis not present

## 2023-02-25 DIAGNOSIS — R2689 Other abnormalities of gait and mobility: Secondary | ICD-10-CM | POA: Diagnosis not present

## 2023-02-25 DIAGNOSIS — M6281 Muscle weakness (generalized): Secondary | ICD-10-CM | POA: Diagnosis not present

## 2023-02-25 DIAGNOSIS — R1312 Dysphagia, oropharyngeal phase: Secondary | ICD-10-CM | POA: Diagnosis not present

## 2023-02-25 DIAGNOSIS — G20A1 Parkinson'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 dyskinesia, without mention of fluctuations: Secondary | ICD-10-CM | POA: Diagnosis not present

## 2023-02-25 DIAGNOSIS — D649 Anemia, unspecified: Secondary | ICD-10-CM | POA: Diagnosis not present

## 2023-02-25 DIAGNOSIS — E119 Type 2 diabetes mellitus without complications: Secondary | ICD-10-CM | POA: Diagnosis not present

## 2023-02-25 DIAGNOSIS — Z9181 History of falling: Secondary | ICD-10-CM | POA: Diagnosis not present

## 2023-02-26 DIAGNOSIS — G20A1 Parkinson'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 dyskinesia, without mention of fluctuations: Secondary | ICD-10-CM | POA: Diagnosis not present

## 2023-02-26 DIAGNOSIS — R2689 Other abnormalities of gait and mobility: Secondary | ICD-10-CM | POA: Diagnosis not present

## 2023-02-26 DIAGNOSIS — Z9181 History of falling: Secondary | ICD-10-CM | POA: Diagnosis not present

## 2023-02-26 DIAGNOSIS — M6281 Muscle weakness (generalized): Secondary | ICD-10-CM | POA: Diagnosis not present

## 2023-02-26 DIAGNOSIS — D649 Anemia, unspecified: Secondary | ICD-10-CM | POA: Diagnosis not present

## 2023-02-26 DIAGNOSIS — E119 Type 2 diabetes mellitus without complications: Secondary | ICD-10-CM | POA: Diagnosis not present

## 2023-02-26 DIAGNOSIS — R1312 Dysphagia, oropharyngeal phase: Secondary | ICD-10-CM | POA: Diagnosis not present

## 2023-02-27 DIAGNOSIS — I480 Paroxysmal atrial fibrillation: Secondary | ICD-10-CM | POA: Diagnosis not present

## 2023-02-27 DIAGNOSIS — R2689 Other abnormalities of gait and mobility: Secondary | ICD-10-CM | POA: Diagnosis not present

## 2023-02-27 DIAGNOSIS — Z9181 History of falling: Secondary | ICD-10-CM | POA: Diagnosis not present

## 2023-02-27 DIAGNOSIS — K219 Gastro-esophageal reflux disease without esophagitis: Secondary | ICD-10-CM | POA: Diagnosis not present

## 2023-02-27 DIAGNOSIS — E119 Type 2 diabetes mellitus without complications: Secondary | ICD-10-CM | POA: Diagnosis not present

## 2023-02-27 DIAGNOSIS — E039 Hypothyroidism, unspecified: Secondary | ICD-10-CM | POA: Diagnosis not present

## 2023-02-27 DIAGNOSIS — D649 Anemia, unspecified: Secondary | ICD-10-CM | POA: Diagnosis not present

## 2023-02-27 DIAGNOSIS — G20A1 Parkinson'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 dyskinesia, without mention of fluctuations: Secondary | ICD-10-CM | POA: Diagnosis not present

## 2023-02-27 DIAGNOSIS — R1312 Dysphagia, oropharyngeal phase: Secondary | ICD-10-CM | POA: Diagnosis not present

## 2023-02-27 DIAGNOSIS — I10 Essential (primary) hypertension: Secondary | ICD-10-CM | POA: Diagnosis not present

## 2023-02-27 DIAGNOSIS — M6281 Muscle weakness (generalized): Secondary | ICD-10-CM | POA: Diagnosis not present

## 2023-02-28 DIAGNOSIS — R1312 Dysphagia, oropharyngeal phase: Secondary | ICD-10-CM | POA: Diagnosis not present

## 2023-02-28 DIAGNOSIS — G20A1 Parkinson'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 dyskinesia, without mention of fluctuations: Secondary | ICD-10-CM | POA: Diagnosis not present

## 2023-02-28 DIAGNOSIS — R2689 Other abnormalities of gait and mobility: Secondary | ICD-10-CM | POA: Diagnosis not present

## 2023-02-28 DIAGNOSIS — M6281 Muscle weakness (generalized): Secondary | ICD-10-CM | POA: Diagnosis not present

## 2023-02-28 DIAGNOSIS — Z9181 History of falling: Secondary | ICD-10-CM | POA: Diagnosis not present

## 2023-02-28 DIAGNOSIS — D649 Anemia, unspecified: Secondary | ICD-10-CM | POA: Diagnosis not present

## 2023-02-28 DIAGNOSIS — E119 Type 2 diabetes mellitus without complications: Secondary | ICD-10-CM | POA: Diagnosis not present

## 2023-03-02 DIAGNOSIS — R1312 Dysphagia, oropharyngeal phase: Secondary | ICD-10-CM | POA: Diagnosis not present

## 2023-03-02 DIAGNOSIS — Z9181 History of falling: Secondary | ICD-10-CM | POA: Diagnosis not present

## 2023-03-02 DIAGNOSIS — M6281 Muscle weakness (generalized): Secondary | ICD-10-CM | POA: Diagnosis not present

## 2023-03-02 DIAGNOSIS — G20A1 Parkinson'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 dyskinesia, without mention of fluctuations: Secondary | ICD-10-CM | POA: Diagnosis not present

## 2023-03-02 DIAGNOSIS — R2689 Other abnormalities of gait and mobility: Secondary | ICD-10-CM | POA: Diagnosis not present

## 2023-03-02 DIAGNOSIS — D649 Anemia, unspecified: Secondary | ICD-10-CM | POA: Diagnosis not present

## 2023-03-02 DIAGNOSIS — E119 Type 2 diabetes mellitus without complications: Secondary | ICD-10-CM | POA: Diagnosis not present

## 2023-03-03 DIAGNOSIS — E119 Type 2 diabetes mellitus without complications: Secondary | ICD-10-CM | POA: Diagnosis not present

## 2023-03-03 DIAGNOSIS — Z9181 History of falling: Secondary | ICD-10-CM | POA: Diagnosis not present

## 2023-03-03 DIAGNOSIS — G20A1 Parkinson'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 dyskinesia, without mention of fluctuations: Secondary | ICD-10-CM | POA: Diagnosis not present

## 2023-03-03 DIAGNOSIS — R1312 Dysphagia, oropharyngeal phase: Secondary | ICD-10-CM | POA: Diagnosis not present

## 2023-03-03 DIAGNOSIS — M6281 Muscle weakness (generalized): Secondary | ICD-10-CM | POA: Diagnosis not present

## 2023-03-03 DIAGNOSIS — D649 Anemia, unspecified: Secondary | ICD-10-CM | POA: Diagnosis not present

## 2023-03-03 DIAGNOSIS — R2689 Other abnormalities of gait and mobility: Secondary | ICD-10-CM | POA: Diagnosis not present

## 2023-03-05 DIAGNOSIS — M6281 Muscle weakness (generalized): Secondary | ICD-10-CM | POA: Diagnosis not present

## 2023-03-05 DIAGNOSIS — D649 Anemia, unspecified: Secondary | ICD-10-CM | POA: Diagnosis not present

## 2023-03-05 DIAGNOSIS — G20A1 Parkinson'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 dyskinesia, without mention of fluctuations: Secondary | ICD-10-CM | POA: Diagnosis not present

## 2023-03-05 DIAGNOSIS — E119 Type 2 diabetes mellitus without complications: Secondary | ICD-10-CM | POA: Diagnosis not present

## 2023-03-05 DIAGNOSIS — R2689 Other abnormalities of gait and mobility: Secondary | ICD-10-CM | POA: Diagnosis not present

## 2023-03-05 DIAGNOSIS — Z9181 History of falling: Secondary | ICD-10-CM | POA: Diagnosis not present

## 2023-03-05 DIAGNOSIS — R1312 Dysphagia, oropharyngeal phase: Secondary | ICD-10-CM | POA: Diagnosis not present

## 2023-03-06 DIAGNOSIS — Z9181 History of falling: Secondary | ICD-10-CM | POA: Diagnosis not present

## 2023-03-06 DIAGNOSIS — M6281 Muscle weakness (generalized): Secondary | ICD-10-CM | POA: Diagnosis not present

## 2023-03-06 DIAGNOSIS — D649 Anemia, unspecified: Secondary | ICD-10-CM | POA: Diagnosis not present

## 2023-03-06 DIAGNOSIS — R2689 Other abnormalities of gait and mobility: Secondary | ICD-10-CM | POA: Diagnosis not present

## 2023-03-06 DIAGNOSIS — G20A1 Parkinson'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 dyskinesia, without mention of fluctuations: Secondary | ICD-10-CM | POA: Diagnosis not present

## 2023-03-06 DIAGNOSIS — E119 Type 2 diabetes mellitus without complications: Secondary | ICD-10-CM | POA: Diagnosis not present

## 2023-03-06 DIAGNOSIS — R1312 Dysphagia, oropharyngeal phase: Secondary | ICD-10-CM | POA: Diagnosis not present

## 2023-03-07 DIAGNOSIS — Z9181 History of falling: Secondary | ICD-10-CM | POA: Diagnosis not present

## 2023-03-07 DIAGNOSIS — R1312 Dysphagia, oropharyngeal phase: Secondary | ICD-10-CM | POA: Diagnosis not present

## 2023-03-07 DIAGNOSIS — E119 Type 2 diabetes mellitus without complications: Secondary | ICD-10-CM | POA: Diagnosis not present

## 2023-03-07 DIAGNOSIS — R2689 Other abnormalities of gait and mobility: Secondary | ICD-10-CM | POA: Diagnosis not present

## 2023-03-07 DIAGNOSIS — M6281 Muscle weakness (generalized): Secondary | ICD-10-CM | POA: Diagnosis not present

## 2023-03-07 DIAGNOSIS — G20A1 Parkinson'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 dyskinesia, without mention of fluctuations: Secondary | ICD-10-CM | POA: Diagnosis not present

## 2023-03-07 DIAGNOSIS — D649 Anemia, unspecified: Secondary | ICD-10-CM | POA: Diagnosis not present

## 2023-03-08 DIAGNOSIS — M6281 Muscle weakness (generalized): Secondary | ICD-10-CM | POA: Diagnosis not present

## 2023-03-08 DIAGNOSIS — G20A1 Parkinson'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 dyskinesia, without mention of fluctuations: Secondary | ICD-10-CM | POA: Diagnosis not present

## 2023-03-08 DIAGNOSIS — R1312 Dysphagia, oropharyngeal phase: Secondary | ICD-10-CM | POA: Diagnosis not present

## 2023-03-08 DIAGNOSIS — Z9181 History of falling: Secondary | ICD-10-CM | POA: Diagnosis not present

## 2023-03-08 DIAGNOSIS — D649 Anemia, unspecified: Secondary | ICD-10-CM | POA: Diagnosis not present

## 2023-03-08 DIAGNOSIS — R2689 Other abnormalities of gait and mobility: Secondary | ICD-10-CM | POA: Diagnosis not present

## 2023-03-08 DIAGNOSIS — E119 Type 2 diabetes mellitus without complications: Secondary | ICD-10-CM | POA: Diagnosis not present

## 2023-03-11 DIAGNOSIS — E119 Type 2 diabetes mellitus without complications: Secondary | ICD-10-CM | POA: Diagnosis not present

## 2023-03-11 DIAGNOSIS — R2689 Other abnormalities of gait and mobility: Secondary | ICD-10-CM | POA: Diagnosis not present

## 2023-03-11 DIAGNOSIS — Z9181 History of falling: Secondary | ICD-10-CM | POA: Diagnosis not present

## 2023-03-11 DIAGNOSIS — M6281 Muscle weakness (generalized): Secondary | ICD-10-CM | POA: Diagnosis not present

## 2023-03-11 DIAGNOSIS — G20A1 Parkinson'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 dyskinesia, without mention of fluctuations: Secondary | ICD-10-CM | POA: Diagnosis not present

## 2023-03-11 DIAGNOSIS — R1312 Dysphagia, oropharyngeal phase: Secondary | ICD-10-CM | POA: Diagnosis not present

## 2023-03-11 DIAGNOSIS — D649 Anemia, unspecified: Secondary | ICD-10-CM | POA: Diagnosis not present

## 2023-03-12 DIAGNOSIS — R1312 Dysphagia, oropharyngeal phase: Secondary | ICD-10-CM | POA: Diagnosis not present

## 2023-03-12 DIAGNOSIS — D649 Anemia, unspecified: Secondary | ICD-10-CM | POA: Diagnosis not present

## 2023-03-12 DIAGNOSIS — M6281 Muscle weakness (generalized): Secondary | ICD-10-CM | POA: Diagnosis not present

## 2023-03-12 DIAGNOSIS — Z9181 History of falling: Secondary | ICD-10-CM | POA: Diagnosis not present

## 2023-03-12 DIAGNOSIS — G20A1 Parkinson'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 dyskinesia, without mention of fluctuations: Secondary | ICD-10-CM | POA: Diagnosis not present

## 2023-03-12 DIAGNOSIS — R2689 Other abnormalities of gait and mobility: Secondary | ICD-10-CM | POA: Diagnosis not present

## 2023-03-12 DIAGNOSIS — E119 Type 2 diabetes mellitus without complications: Secondary | ICD-10-CM | POA: Diagnosis not present

## 2023-03-13 DIAGNOSIS — I480 Paroxysmal atrial fibrillation: Secondary | ICD-10-CM | POA: Diagnosis not present

## 2023-03-13 DIAGNOSIS — W19XXXD Unspecified fall, subsequent encounter: Secondary | ICD-10-CM | POA: Diagnosis not present

## 2023-03-13 DIAGNOSIS — I10 Essential (primary) hypertension: Secondary | ICD-10-CM | POA: Diagnosis not present

## 2023-03-15 DIAGNOSIS — E119 Type 2 diabetes mellitus without complications: Secondary | ICD-10-CM | POA: Diagnosis not present

## 2023-03-15 DIAGNOSIS — M6281 Muscle weakness (generalized): Secondary | ICD-10-CM | POA: Diagnosis not present

## 2023-03-15 DIAGNOSIS — Z9181 History of falling: Secondary | ICD-10-CM | POA: Diagnosis not present

## 2023-03-15 DIAGNOSIS — E039 Hypothyroidism, unspecified: Secondary | ICD-10-CM | POA: Diagnosis not present

## 2023-03-15 DIAGNOSIS — R2689 Other abnormalities of gait and mobility: Secondary | ICD-10-CM | POA: Diagnosis not present

## 2023-03-15 DIAGNOSIS — R1312 Dysphagia, oropharyngeal phase: Secondary | ICD-10-CM | POA: Diagnosis not present

## 2023-03-15 DIAGNOSIS — E1165 Type 2 diabetes mellitus with hyperglycemia: Secondary | ICD-10-CM | POA: Diagnosis not present

## 2023-03-15 DIAGNOSIS — G20A1 Parkinson'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 dyskinesia, without mention of fluctuations: Secondary | ICD-10-CM | POA: Diagnosis not present

## 2023-03-15 DIAGNOSIS — E0840 Diabetes mellitus due to underlying condition with diabetic neuropathy, unspecified: Secondary | ICD-10-CM | POA: Diagnosis not present

## 2023-03-15 DIAGNOSIS — D649 Anemia, unspecified: Secondary | ICD-10-CM | POA: Diagnosis not present

## 2023-03-18 DIAGNOSIS — R2689 Other abnormalities of gait and mobility: Secondary | ICD-10-CM | POA: Diagnosis not present

## 2023-03-18 DIAGNOSIS — M6281 Muscle weakness (generalized): Secondary | ICD-10-CM | POA: Diagnosis not present

## 2023-03-18 DIAGNOSIS — D649 Anemia, unspecified: Secondary | ICD-10-CM | POA: Diagnosis not present

## 2023-03-18 DIAGNOSIS — Z9181 History of falling: Secondary | ICD-10-CM | POA: Diagnosis not present

## 2023-03-18 DIAGNOSIS — G20A1 Parkinson'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 dyskinesia, without mention of fluctuations: Secondary | ICD-10-CM | POA: Diagnosis not present

## 2023-03-18 DIAGNOSIS — E119 Type 2 diabetes mellitus without complications: Secondary | ICD-10-CM | POA: Diagnosis not present

## 2023-03-18 DIAGNOSIS — R1312 Dysphagia, oropharyngeal phase: Secondary | ICD-10-CM | POA: Diagnosis not present

## 2023-03-19 DIAGNOSIS — R1312 Dysphagia, oropharyngeal phase: Secondary | ICD-10-CM | POA: Diagnosis not present

## 2023-03-19 DIAGNOSIS — G20A1 Parkinson'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 dyskinesia, without mention of fluctuations: Secondary | ICD-10-CM | POA: Diagnosis not present

## 2023-03-19 DIAGNOSIS — K219 Gastro-esophageal reflux disease without esophagitis: Secondary | ICD-10-CM | POA: Diagnosis not present

## 2023-03-19 DIAGNOSIS — R262 Difficulty in walking, not elsewhere classified: Secondary | ICD-10-CM | POA: Diagnosis not present

## 2023-03-19 DIAGNOSIS — M6281 Muscle weakness (generalized): Secondary | ICD-10-CM | POA: Diagnosis not present

## 2023-03-19 DIAGNOSIS — R2689 Other abnormalities of gait and mobility: Secondary | ICD-10-CM | POA: Diagnosis not present

## 2023-03-19 DIAGNOSIS — E119 Type 2 diabetes mellitus without complications: Secondary | ICD-10-CM | POA: Diagnosis not present

## 2023-03-19 DIAGNOSIS — I10 Essential (primary) hypertension: Secondary | ICD-10-CM | POA: Diagnosis not present

## 2023-03-19 DIAGNOSIS — Z9181 History of falling: Secondary | ICD-10-CM | POA: Diagnosis not present

## 2023-03-19 DIAGNOSIS — D649 Anemia, unspecified: Secondary | ICD-10-CM | POA: Diagnosis not present

## 2023-03-19 DIAGNOSIS — I480 Paroxysmal atrial fibrillation: Secondary | ICD-10-CM | POA: Diagnosis not present

## 2023-03-20 DIAGNOSIS — G20A1 Parkinson'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 dyskinesia, without mention of fluctuations: Secondary | ICD-10-CM | POA: Diagnosis not present

## 2023-03-20 DIAGNOSIS — R262 Difficulty in walking, not elsewhere classified: Secondary | ICD-10-CM | POA: Diagnosis not present

## 2023-03-20 DIAGNOSIS — E119 Type 2 diabetes mellitus without complications: Secondary | ICD-10-CM | POA: Diagnosis not present

## 2023-03-20 DIAGNOSIS — D649 Anemia, unspecified: Secondary | ICD-10-CM | POA: Diagnosis not present

## 2023-03-20 DIAGNOSIS — M6281 Muscle weakness (generalized): Secondary | ICD-10-CM | POA: Diagnosis not present

## 2023-03-20 DIAGNOSIS — R2689 Other abnormalities of gait and mobility: Secondary | ICD-10-CM | POA: Diagnosis not present

## 2023-03-20 DIAGNOSIS — R1312 Dysphagia, oropharyngeal phase: Secondary | ICD-10-CM | POA: Diagnosis not present

## 2023-03-20 DIAGNOSIS — Z9181 History of falling: Secondary | ICD-10-CM | POA: Diagnosis not present

## 2023-03-21 DIAGNOSIS — E0840 Diabetes mellitus due to underlying condition with diabetic neuropathy, unspecified: Secondary | ICD-10-CM | POA: Diagnosis not present

## 2023-03-21 DIAGNOSIS — R1312 Dysphagia, oropharyngeal phase: Secondary | ICD-10-CM | POA: Diagnosis not present

## 2023-03-21 DIAGNOSIS — G20A1 Parkinson'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 dyskinesia, without mention of fluctuations: Secondary | ICD-10-CM | POA: Diagnosis not present

## 2023-03-21 DIAGNOSIS — R2689 Other abnormalities of gait and mobility: Secondary | ICD-10-CM | POA: Diagnosis not present

## 2023-03-21 DIAGNOSIS — Z9181 History of falling: Secondary | ICD-10-CM | POA: Diagnosis not present

## 2023-03-21 DIAGNOSIS — E039 Hypothyroidism, unspecified: Secondary | ICD-10-CM | POA: Diagnosis not present

## 2023-03-21 DIAGNOSIS — R262 Difficulty in walking, not elsewhere classified: Secondary | ICD-10-CM | POA: Diagnosis not present

## 2023-03-21 DIAGNOSIS — D649 Anemia, unspecified: Secondary | ICD-10-CM | POA: Diagnosis not present

## 2023-03-21 DIAGNOSIS — E1165 Type 2 diabetes mellitus with hyperglycemia: Secondary | ICD-10-CM | POA: Diagnosis not present

## 2023-03-21 DIAGNOSIS — M6281 Muscle weakness (generalized): Secondary | ICD-10-CM | POA: Diagnosis not present

## 2023-03-21 DIAGNOSIS — E119 Type 2 diabetes mellitus without complications: Secondary | ICD-10-CM | POA: Diagnosis not present

## 2023-03-22 DIAGNOSIS — R2689 Other abnormalities of gait and mobility: Secondary | ICD-10-CM | POA: Diagnosis not present

## 2023-03-22 DIAGNOSIS — D649 Anemia, unspecified: Secondary | ICD-10-CM | POA: Diagnosis not present

## 2023-03-22 DIAGNOSIS — R1312 Dysphagia, oropharyngeal phase: Secondary | ICD-10-CM | POA: Diagnosis not present

## 2023-03-22 DIAGNOSIS — R262 Difficulty in walking, not elsewhere classified: Secondary | ICD-10-CM | POA: Diagnosis not present

## 2023-03-22 DIAGNOSIS — Z9181 History of falling: Secondary | ICD-10-CM | POA: Diagnosis not present

## 2023-03-22 DIAGNOSIS — E119 Type 2 diabetes mellitus without complications: Secondary | ICD-10-CM | POA: Diagnosis not present

## 2023-03-22 DIAGNOSIS — M6281 Muscle weakness (generalized): Secondary | ICD-10-CM | POA: Diagnosis not present

## 2023-03-22 DIAGNOSIS — G20A1 Parkinson'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 dyskinesia, without mention of fluctuations: Secondary | ICD-10-CM | POA: Diagnosis not present

## 2023-03-23 ENCOUNTER — Emergency Department (HOSPITAL_COMMUNITY): Payer: Medicare Other

## 2023-03-23 ENCOUNTER — Encounter (HOSPITAL_COMMUNITY): Payer: Self-pay

## 2023-03-23 ENCOUNTER — Emergency Department (HOSPITAL_COMMUNITY)
Admission: EM | Admit: 2023-03-23 | Discharge: 2023-03-24 | Disposition: A | Discharge status: Home / Self Care | Payer: Medicare Other | Attending: Emergency Medicine | Admitting: Emergency Medicine

## 2023-03-23 DIAGNOSIS — F0390 Unspecified dementia without behavioral disturbance: Secondary | ICD-10-CM | POA: Insufficient documentation

## 2023-03-23 DIAGNOSIS — I499 Cardiac arrhythmia, unspecified: Secondary | ICD-10-CM | POA: Diagnosis not present

## 2023-03-23 DIAGNOSIS — Z7902 Long term (current) use of antithrombotics/antiplatelets: Secondary | ICD-10-CM | POA: Insufficient documentation

## 2023-03-23 DIAGNOSIS — Z7982 Long term (current) use of aspirin: Secondary | ICD-10-CM | POA: Insufficient documentation

## 2023-03-23 DIAGNOSIS — I10 Essential (primary) hypertension: Secondary | ICD-10-CM | POA: Insufficient documentation

## 2023-03-23 DIAGNOSIS — I119 Hypertensive heart disease without heart failure: Secondary | ICD-10-CM | POA: Diagnosis not present

## 2023-03-23 DIAGNOSIS — W050XXA Fall from non-moving wheelchair, initial encounter: Secondary | ICD-10-CM | POA: Diagnosis not present

## 2023-03-23 DIAGNOSIS — Z743 Need for continuous supervision: Secondary | ICD-10-CM | POA: Diagnosis not present

## 2023-03-23 DIAGNOSIS — I700 Atherosclerosis of aorta: Secondary | ICD-10-CM | POA: Insufficient documentation

## 2023-03-23 DIAGNOSIS — M47811 Spondylosis without myelopathy or radiculopathy, occipito-atlanto-axial region: Secondary | ICD-10-CM | POA: Diagnosis not present

## 2023-03-23 DIAGNOSIS — M47812 Spondylosis without myelopathy or radiculopathy, cervical region: Secondary | ICD-10-CM | POA: Diagnosis not present

## 2023-03-23 DIAGNOSIS — I6782 Cerebral ischemia: Secondary | ICD-10-CM | POA: Diagnosis not present

## 2023-03-23 DIAGNOSIS — I2510 Atherosclerotic heart disease of native coronary artery without angina pectoris: Secondary | ICD-10-CM | POA: Diagnosis not present

## 2023-03-23 DIAGNOSIS — I77819 Aortic ectasia, unspecified site: Secondary | ICD-10-CM | POA: Diagnosis not present

## 2023-03-23 DIAGNOSIS — Z7984 Long term (current) use of oral hypoglycemic drugs: Secondary | ICD-10-CM | POA: Diagnosis not present

## 2023-03-23 DIAGNOSIS — S2243XA Multiple fractures of ribs, bilateral, initial encounter for closed fracture: Secondary | ICD-10-CM | POA: Diagnosis not present

## 2023-03-23 DIAGNOSIS — K219 Gastro-esophageal reflux disease without esophagitis: Secondary | ICD-10-CM | POA: Insufficient documentation

## 2023-03-23 DIAGNOSIS — Z7901 Long term (current) use of anticoagulants: Secondary | ICD-10-CM | POA: Insufficient documentation

## 2023-03-23 DIAGNOSIS — G319 Degenerative disease of nervous system, unspecified: Secondary | ICD-10-CM | POA: Diagnosis not present

## 2023-03-23 DIAGNOSIS — E785 Hyperlipidemia, unspecified: Secondary | ICD-10-CM | POA: Insufficient documentation

## 2023-03-23 DIAGNOSIS — R6889 Other general symptoms and signs: Secondary | ICD-10-CM | POA: Diagnosis not present

## 2023-03-23 DIAGNOSIS — S0083XA Contusion of other part of head, initial encounter: Secondary | ICD-10-CM | POA: Diagnosis not present

## 2023-03-23 DIAGNOSIS — Z043 Encounter for examination and observation following other accident: Secondary | ICD-10-CM | POA: Diagnosis not present

## 2023-03-23 DIAGNOSIS — Z79899 Other long term (current) drug therapy: Secondary | ICD-10-CM | POA: Insufficient documentation

## 2023-03-23 DIAGNOSIS — I252 Old myocardial infarction: Secondary | ICD-10-CM | POA: Insufficient documentation

## 2023-03-23 DIAGNOSIS — S3993XA Unspecified injury of pelvis, initial encounter: Secondary | ICD-10-CM | POA: Diagnosis not present

## 2023-03-23 DIAGNOSIS — M160 Bilateral primary osteoarthritis of hip: Secondary | ICD-10-CM | POA: Diagnosis not present

## 2023-03-23 DIAGNOSIS — E119 Type 2 diabetes mellitus without complications: Secondary | ICD-10-CM | POA: Diagnosis not present

## 2023-03-23 DIAGNOSIS — S0990XA Unspecified injury of head, initial encounter: Secondary | ICD-10-CM | POA: Insufficient documentation

## 2023-03-23 DIAGNOSIS — G9389 Other specified disorders of brain: Secondary | ICD-10-CM | POA: Diagnosis not present

## 2023-03-23 DIAGNOSIS — I517 Cardiomegaly: Secondary | ICD-10-CM | POA: Diagnosis not present

## 2023-03-23 DIAGNOSIS — M5021 Other cervical disc displacement,  high cervical region: Secondary | ICD-10-CM | POA: Diagnosis not present

## 2023-03-23 DIAGNOSIS — S0993XA Unspecified injury of face, initial encounter: Secondary | ICD-10-CM | POA: Diagnosis present

## 2023-03-23 DIAGNOSIS — W19XXXA Unspecified fall, initial encounter: Secondary | ICD-10-CM | POA: Insufficient documentation

## 2023-03-23 LAB — COMPREHENSIVE METABOLIC PANEL
ALT: 18 U/L (ref 0–44)
AST: 20 U/L (ref 15–41)
Albumin: 2.3 g/dL — ABNORMAL LOW (ref 3.5–5.0)
Alkaline Phosphatase: 106 U/L (ref 38–126)
Anion gap: 12 (ref 5–15)
BUN: 11 mg/dL (ref 8–23)
CO2: 26 mmol/L (ref 22–32)
Calcium: 7.7 mg/dL — ABNORMAL LOW (ref 8.9–10.3)
Chloride: 94 mmol/L — ABNORMAL LOW (ref 98–111)
Creatinine, Ser: 0.97 mg/dL (ref 0.61–1.24)
GFR, Estimated: >60 mL/min (ref >60)
Glucose, Bld: 79 mg/dL (ref 70–99)
Potassium: 3.5 mmol/L (ref 3.5–5.1)
Sodium: 132 mmol/L — ABNORMAL LOW (ref 135–145)
Total Bilirubin: 0.6 mg/dL (ref 0.3–1.2)
Total Protein: 6.2 g/dL — ABNORMAL LOW (ref 6.5–8.1)

## 2023-03-23 LAB — I-STAT CHEM 8, ED
BUN: 12 mg/dL (ref 8–23)
Calcium, Ion: 0.98 mmol/L — ABNORMAL LOW (ref 1.15–1.40)
Chloride: 94 mmol/L — ABNORMAL LOW (ref 98–111)
Creatinine, Ser: 0.8 mg/dL (ref 0.61–1.24)
Glucose, Bld: 75 mg/dL (ref 70–99)
HCT: 42 % (ref 39.0–52.0)
Hemoglobin: 14.3 g/dL (ref 13.0–17.0)
Potassium: 3.5 mmol/L (ref 3.5–5.1)
Sodium: 134 mmol/L — ABNORMAL LOW (ref 135–145)
TCO2: 27 mmol/L (ref 22–32)

## 2023-03-23 LAB — PROTIME-INR
INR: 1.1 (ref 0.8–1.2)
Prothrombin Time: 14.2 s (ref 11.4–15.2)

## 2023-03-23 LAB — CBC
HCT: 39.3 % (ref 39.0–52.0)
Hemoglobin: 13.1 g/dL (ref 13.0–17.0)
MCH: 28.7 pg (ref 26.0–34.0)
MCHC: 33.3 g/dL (ref 30.0–36.0)
MCV: 86.2 fL (ref 80.0–100.0)
Platelets: 363 10*3/uL (ref 150–400)
RBC: 4.56 MIL/uL (ref 4.22–5.81)
RDW: 13.9 % (ref 11.5–15.5)
WBC: 8.8 10*3/uL (ref 4.0–10.5)
nRBC: 0 % (ref 0.0–0.2)

## 2023-03-23 LAB — BRAIN NATRIURETIC PEPTIDE: B Natriuretic Peptide: 120.8 pg/mL — ABNORMAL HIGH (ref 0.0–100.0)

## 2023-03-23 NOTE — ED Triage Notes (Signed)
[facility] with hematoma on his head. Has cognitive and communicative disorder. Fell from wheel chair, no other injuries noted.

## 2023-03-23 NOTE — ED Notes (Signed)
C-Collar removed per DR Criss Alvine

## 2023-03-23 NOTE — ED Provider Notes (Signed)
[facility]ERGENC[facility] DEPARTMENT AT [facility] Provider Note   CSN: [id number] Arrival date & time: 03/23/23  1714     History  Chief Complaint  Patient presents with   Gerald Leach is a 89 y.o. male.   Fall     89 year old male with medical history significant for HTN, GERD, HLD, cerebral aneurysm status post coiling, seizures, DM2, dementia, STEMI/CAD (had been determined to not be a candidate for revascularization, managed medically and had been on hospice) who presents to the emergency department after a fall as a level 2 trauma due to fall on Plavix.  The patient resides at [facility] nursing facility.  He sustained an unwitnessed fall with hematoma to his head.  He fell from his wheelchair and was noted to have a hematoma but no other injuries. He arrives GCS 14, ABC intact.  Home Medications Prior to Admission medications   Medication Sig Start Date End Date Taking? Authorizing Provider  acetaminophen (TYLENOL) 325 MG tablet Take 2 tablets (650 mg total) by mouth every 6 (six) hours as needed for mild pain (or Fever >/= 101). 03/24/21   [name], DO  amantadine (SYMMETREL) 100 MG capsule Take 1 capsule (100 mg total) by mouth daily. 05/18/21   Nita Sickle K, DO  amiodarone (PACERONE) 200 MG tablet Take 1 tablet (200 mg total) by mouth daily. 09/15/21   Erick Blinks, MD  aspirin EC 81 MG EC tablet Take 1 tablet (81 mg total) by mouth daily. Swallow whole. 09/15/21   Erick Blinks, MD  atorvastatin (LIPITOR) 40 MG tablet Take 1 tablet (40 mg total) by mouth daily. 09/15/21   Erick Blinks, MD  carbidopa-levodopa (SINEMET IR) 25-100 MG tablet TAKE 1 TABLET BY MOUTH THREE TIMES A DAY Patient taking differently: Take 1 tablet by mouth 3 (three) times daily. 07/04/21   Nita Sickle K, DO  clopidogrel (PLAVIX) 75 MG tablet Take 1 tablet (75 mg total) by mouth daily. 09/15/21   Erick Blinks, MD  ferrous sulfate 325 (65 FE) MG tablet Take 325 mg  by mouth daily with breakfast.    [provider]  Lacosamide 150 MG TABS Take 1 tablet (150 mg total) by mouth 2 (two) times daily. 05/31/21   Patel, Donika K, DO  levETIRAcetam (KEPPRA) 750 MG tablet TAKE 2 TABLETS (1,500 MG TOTAL) BY MOUTH 2 (TWO) TIMES DAILY. 06/26/21   Nita Sickle K, DO  levothyroxine (SYNTHROID) 50 MCG tablet Take 50 mcg by mouth every morning. On an empty stomach 03/21/22   [provider]  levothyroxine (SYNTHROID) 75 MCG tablet TAKE 1 TABLET BY MOUTH EVERY DAY 07/04/21   Nafziger, Kandee Keen, NP  LORazepam (ATIVAN) 1 MG tablet Take 1 tablet (1 mg total) by mouth every 6 (six) hours as needed for anxiety (dyspnea). 09/14/21   Erick Blinks, MD  metFORMIN (GLUCOPHAGE) 500 MG tablet Take 500 mg by mouth 2 (two) times daily. 11/01/21   [provider]  nitroGLYCERIN (NITROSTAT) 0.4 MG SL tablet Place 1 tablet (0.4 mg total) under the tongue every 5 (five) minutes x 3 doses as needed for chest pain. 09/14/21   Erick Blinks, MD  omeprazole (PRILOSEC) 20 MG capsule TAKE 1 CAPSULE BY MOUTH EVERY DAY Patient taking differently: Take 20 mg by mouth daily. 05/10/21   Nafziger, Kandee Keen, NP  ondansetron (ZOFRAN) 4 MG tablet Take 1 tablet (4 mg total) by mouth every 6 (six) hours as needed for nausea. 03/24/21   Marguerita Merles  Latif, DO  vitamin B-12 (CYANOCOBALAMIN) 1000 MCG tablet Take 1,000 mcg by mouth daily.    [provider]      Allergies    Patient has no known allergies.    Review of Systems   Review of Systems  Unable to perform ROS: Dementia    Physical Exam Updated Vital Signs BP 123/87 (BP Location: Right Arm)   Pulse 85   Temp 97.8 F (36.6 C) (Oral)   Resp 20   SpO2 91% Comment: patient continues to remove [place], reapplied and patient redirected Physical Exam Vitals and nursing note reviewed.  Constitutional:      Appearance: He is well-developed.     Comments: GCS 15, ABC intact  HENT:     Head: Normocephalic.     Comments:  Forehead hematoma Eyes:     Conjunctiva/sclera: Conjunctivae normal.  Neck:     Comments: C-collar in place Cardiovascular:     Rate and Rhythm: Normal rate and regular rhythm.  Pulmonary:     Effort: Pulmonary effort is normal. No respiratory distress.     Breath sounds: Normal breath sounds.  Chest:     Comments: Chest wall stable and non-tender to AP and lateral compression. Clavicles stable and non-tender to AP compression Abdominal:     Palpations: Abdomen is soft.     Tenderness: There is no abdominal tenderness.     Comments: Pelvis stable to lateral compression.  Musculoskeletal:     Cervical back: Neck supple.     Comments: No midline tenderness to palpation of the thoracic or lumbar spine. Extremities atraumatic with intact ROM.   Skin:    General: Skin is warm and dry.  Neurological:     Mental Status: He is alert.     Comments: CN II-XII grossly intact. Moving all four extremities spontaneously and sensation grossly intact.     ED Results / Procedures / Treatments   Labs (all labs ordered are listed, but only abnormal results are displayed) Labs Reviewed  COMPREHENSIVE METABOLIC PANEL - Abnormal; Notable for the following components:      Result Value   Sodium 132 (*)    Chloride 94 (*)    Calcium 7.7 (*)    Total Protein 6.2 (*)    Albumin 2.3 (*)    All other components within normal limits  I-STAT CHEM 8, ED - Abnormal; Notable for the following components:   Sodium 134 (*)    Chloride 94 (*)    Calcium, Ion 0.98 (*)    All other components within normal limits  CBC  PROTIME-INR  BRAIN NATRIURETIC PEPTIDE    EKG None  Radiology CT CERVICAL SPINE WO CONTRAST  Result Date: 03/23/2023 CLINICAL DATA:  Fall EXAM: CT CERVICAL SPINE WITHOUT CONTRAST TECHNIQUE: Multidetector CT imaging of the cervical spine was performed without intravenous contrast. Multiplanar CT image reconstructions were also generated. RADIATION DOSE REDUCTION: This exam was  performed according to the departmental dose-optimization program which includes automated exposure control, adjustment of the mA and/or kV according to patient size and/or use of iterative reconstruction technique. COMPARISON:  CT 11/20/2022 FINDINGS: Alignment: Trace retrolisthesis C3 on C4. Facet alignment is within normal limits. Skull base and vertebrae: No acute fracture. No primary bone lesion or focal pathologic process. Mild chronic superior endplate deformity T3. Soft tissues and spinal canal: No prevertebral fluid or swelling. No visible canal hematoma. Disc levels: Partial ankylosis at C2-C3. Advanced C1-C2 degenerative change with erosive change at the tip of dens. Mild  to moderate diffuse degenerative changes with multilevel disc space narrowing, facet degenerative changes and foraminal narrowing Upper chest: Negative. Other: None IMPRESSION: 1. No CT evidence for acute osseous abnormality. 2. Degenerative changes. 3. Mild chronic superior endplate deformity at T3. Electronically Signed   By: Jasmine Pang M.D.   On: 03/23/2023 18:50   CT HEAD WO CONTRAST  Result Date: 03/23/2023 CLINICAL DATA:  Head trauma EXAM: CT HEAD WITHOUT CONTRAST TECHNIQUE: Contiguous axial images were obtained from the base of the skull through the vertex without intravenous contrast. RADIATION DOSE REDUCTION: This exam was performed according to the departmental dose-optimization program which includes automated exposure control, adjustment of the mA and/or kV according to patient size and/or use of iterative reconstruction technique. COMPARISON:  CT 11/20/2022 FINDINGS: Brain: No acute territorial infarction, hemorrhage, or new intracranial mass. Stable dystrophic coarse calcification in the right supraclinoid region measuring 2.4 x 1.6 cm, thought to represent giant calcified ICA aneurysm on previous exams. No surrounding edema or mass effect. Atrophy. Advanced chronic small vessel ischemic changes of the white matter.  Stable ventricular enlargement. Small right temporal lobe white matter encephalomalacia, chronic. Vascular: No hyperdense vessels.  Carotid vascular calcification Skull: Right craniotomy.  No fracture Sinuses/Orbits: No acute finding. Other: None IMPRESSION: 1. No CT evidence for acute intracranial abnormality. 2. Atrophy and advanced chronic small vessel ischemic changes of the white matter. 3. Stable dystrophic coarse calcification in the right supraclinoid region, thought to represent giant calcified ICA aneurysm on previous exams. Electronically Signed   By: Jasmine Pang M.D.   On: 03/23/2023 18:45   DG Pelvis Portable  Result Date: 03/23/2023 CLINICAL DATA:  Trauma fall EXAM: PORTABLE PELVIS 1-2 VIEWS COMPARISON:  11/20/2022 FINDINGS: SI joints are non widened. Pubic symphysis and rami appear grossly intact. No definitive fracture or malalignment. Mild hip degenerative change. Catheter or tubing over the left groin area IMPRESSION: No acute osseous abnormality. Electronically Signed   By: Jasmine Pang M.D.   On: 03/23/2023 18:35   DG Chest Port 1 View  Result Date: 03/23/2023 CLINICAL DATA:  Trauma EXAM: PORTABLE CHEST 1 VIEW COMPARISON:  02/18/2022, 11/20/2022, CT 11/20/2022 FINDINGS: Multiple chronic bilateral rib fractures. Stable cardiomediastinal silhouette with borderline cardiomegaly and ectatic aorta. Aortic atherosclerosis. Scarring or atelectasis at the bases. No acute airspace disease, pleural effusion or pneumothorax. IMPRESSION: No active disease. Borderline cardiomegaly. Electronically Signed   By: Jasmine Pang M.D.   On: 03/23/2023 18:34    Procedures Procedures    Medications Ordered in ED Medications - No data to display  ED Course/ Medical Decision Making/ A&P                                 Medical Decision Making Amount and/or Complexity of Data Reviewed Labs: ordered. Radiology: ordered.    89 year old male with medical history significant for HTN, GERD, HLD,  cerebral aneurysm status post coiling, seizures, DM2, dementia, STEMI/CAD (had been determined to not be a candidate for revascularization, managed medically and had been on hospice) who presents to the emergency department after a fall as a level 2 trauma due to fall on Plavix.  The patient resides at [facility] nursing facility.  He sustained an unwitnessed fall with hematoma to his head.  He fell from his wheelchair and was noted to have a hematoma but no other injuries. He arrives GCS 14, ABC intact.  On arrival, the patient was vitally stable.  Trauma  workup initiated to include chest x-ray, pelvis x-ray, CT imaging of the head and cervical spine.  CT Head and Cervical Spine: IMPRESSION:  1. No CT evidence for acute intracranial abnormality.  2. Atrophy and advanced chronic small vessel ischemic changes of the  white matter.  3. Stable dystrophic coarse calcification in the right supraclinoid  region, thought to represent giant calcified ICA aneurysm on  previous exams.   CXR: IMPRESSION:  No active disease. Borderline cardiomegaly.   Pelvic XR: IMPRESSION:  No acute osseous abnormality.    I called the patient's son, [name] below at [phone] discussed the patient's trauma workup which was ultimately negative.  He did endorse concerns that the patient has had some increasing lower extremity swelling.  The patient is in no respiratory distress and has no edema on chest x-ray.  I did review his last echocardiogram which revealed a left ventricular EF of 30 to 35%.  He is not currently on Lasix.  Lower suspicion for CHF exacerbation.  The patient does not have significant swelling.  I discussed plan for BNP given the patient's recent swelling over the past couple of days bilaterally in his lower extremities.  Plan for likely BNP and then subsequent discharge with outpatient cardiology follow-up for consideration for outpatient echocardiogram to reevaluate the patient's cardiac function.   Signout given to Dr. Criss Alvine pending results of BNP, ultimate plan for likely discharge back to his facility at [facility].     Final Clinical Impression(s) / ED Diagnoses Final diagnoses:  Fall, initial encounter  Traumatic hematoma of forehead, initial encounter    Rx / DC Orders ED Discharge Orders     None         Ernie Avena, MD 03/23/23 2247

## 2023-03-23 NOTE — ED Provider Notes (Signed)
Care assumed from Dr. Criss Alvine, patient with fall pending BNP. Plan is for discharge, diuretics for several days if elevated.  BNP has come back minimally elevated at 120.8.  Without other signs of heart failure, I do not feel he needs diuretics.  I am discharging him.   Dione Booze, MD 03/24/23 [phone]

## 2023-03-23 NOTE — Progress Notes (Signed)
Orthopedic Tech Progress Note Patient Details:  Gerald Leach 08-15-1933 [id number] Level 2 Trauma  [name], male   DOB: [birth year], 89 y.o.   MRN: [id number]  Gerald Leach 03/23/2023, 5:53 PM

## 2023-03-24 DIAGNOSIS — R404 Transient alteration of awareness: Secondary | ICD-10-CM | POA: Diagnosis not present

## 2023-03-24 DIAGNOSIS — Z7401 Bed confinement status: Secondary | ICD-10-CM | POA: Diagnosis not present

## 2023-03-24 NOTE — ED Notes (Signed)
Called [facility] to transfer patient to [facility]

## 2023-03-25 DIAGNOSIS — G20A1 Parkinson'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 dyskinesia, without mention of fluctuations: Secondary | ICD-10-CM | POA: Diagnosis not present

## 2023-03-25 DIAGNOSIS — Z9181 History of falling: Secondary | ICD-10-CM | POA: Diagnosis not present

## 2023-03-25 DIAGNOSIS — R2689 Other abnormalities of gait and mobility: Secondary | ICD-10-CM | POA: Diagnosis not present

## 2023-03-25 DIAGNOSIS — E1165 Type 2 diabetes mellitus with hyperglycemia: Secondary | ICD-10-CM | POA: Diagnosis not present

## 2023-03-25 DIAGNOSIS — M6281 Muscle weakness (generalized): Secondary | ICD-10-CM | POA: Diagnosis not present

## 2023-03-25 DIAGNOSIS — R1312 Dysphagia, oropharyngeal phase: Secondary | ICD-10-CM | POA: Diagnosis not present

## 2023-03-25 DIAGNOSIS — E0840 Diabetes mellitus due to underlying condition with diabetic neuropathy, unspecified: Secondary | ICD-10-CM | POA: Diagnosis not present

## 2023-03-25 DIAGNOSIS — E039 Hypothyroidism, unspecified: Secondary | ICD-10-CM | POA: Diagnosis not present

## 2023-03-25 DIAGNOSIS — D649 Anemia, unspecified: Secondary | ICD-10-CM | POA: Diagnosis not present

## 2023-03-25 DIAGNOSIS — E119 Type 2 diabetes mellitus without complications: Secondary | ICD-10-CM | POA: Diagnosis not present

## 2023-03-27 DIAGNOSIS — L97509 Non-pressure chronic ulcer of other part of unspecified foot with unspecified severity: Secondary | ICD-10-CM | POA: Diagnosis not present

## 2023-03-28 DIAGNOSIS — R1312 Dysphagia, oropharyngeal phase: Secondary | ICD-10-CM | POA: Diagnosis not present

## 2023-03-28 DIAGNOSIS — E1165 Type 2 diabetes mellitus with hyperglycemia: Secondary | ICD-10-CM | POA: Diagnosis not present

## 2023-03-28 DIAGNOSIS — Z9181 History of falling: Secondary | ICD-10-CM | POA: Diagnosis not present

## 2023-03-28 DIAGNOSIS — E039 Hypothyroidism, unspecified: Secondary | ICD-10-CM | POA: Diagnosis not present

## 2023-03-28 DIAGNOSIS — R2689 Other abnormalities of gait and mobility: Secondary | ICD-10-CM | POA: Diagnosis not present

## 2023-03-28 DIAGNOSIS — G20A1 Parkinson'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 dyskinesia, without mention of fluctuations: Secondary | ICD-10-CM | POA: Diagnosis not present

## 2023-03-28 DIAGNOSIS — E119 Type 2 diabetes mellitus without complications: Secondary | ICD-10-CM | POA: Diagnosis not present

## 2023-03-28 DIAGNOSIS — D649 Anemia, unspecified: Secondary | ICD-10-CM | POA: Diagnosis not present

## 2023-03-28 DIAGNOSIS — M6281 Muscle weakness (generalized): Secondary | ICD-10-CM | POA: Diagnosis not present

## 2023-03-28 DIAGNOSIS — E0840 Diabetes mellitus due to underlying condition with diabetic neuropathy, unspecified: Secondary | ICD-10-CM | POA: Diagnosis not present

## 2023-04-02 ENCOUNTER — Other Ambulatory Visit: Payer: Self-pay | Admitting: *Deleted

## 2023-04-02 DIAGNOSIS — I739 Peripheral vascular disease, unspecified: Secondary | ICD-10-CM

## 2023-04-03 DIAGNOSIS — G20A1 Parkinson'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 dyskinesia, without mention of fluctuations: Secondary | ICD-10-CM | POA: Diagnosis not present

## 2023-04-03 DIAGNOSIS — Z9181 History of falling: Secondary | ICD-10-CM | POA: Diagnosis not present

## 2023-04-03 DIAGNOSIS — E0840 Diabetes mellitus due to underlying condition with diabetic neuropathy, unspecified: Secondary | ICD-10-CM | POA: Diagnosis not present

## 2023-04-03 DIAGNOSIS — D649 Anemia, unspecified: Secondary | ICD-10-CM | POA: Diagnosis not present

## 2023-04-03 DIAGNOSIS — E039 Hypothyroidism, unspecified: Secondary | ICD-10-CM | POA: Diagnosis not present

## 2023-04-03 DIAGNOSIS — E119 Type 2 diabetes mellitus without complications: Secondary | ICD-10-CM | POA: Diagnosis not present

## 2023-04-03 DIAGNOSIS — M6281 Muscle weakness (generalized): Secondary | ICD-10-CM | POA: Diagnosis not present

## 2023-04-03 DIAGNOSIS — R2689 Other abnormalities of gait and mobility: Secondary | ICD-10-CM | POA: Diagnosis not present

## 2023-04-03 DIAGNOSIS — E1165 Type 2 diabetes mellitus with hyperglycemia: Secondary | ICD-10-CM | POA: Diagnosis not present

## 2023-04-03 DIAGNOSIS — R1312 Dysphagia, oropharyngeal phase: Secondary | ICD-10-CM | POA: Diagnosis not present

## 2023-04-04 ENCOUNTER — Ambulatory Visit (HOSPITAL_COMMUNITY)
Admission: RE | Admit: 2023-04-04 | Discharge: 2023-04-04 | Disposition: A | Discharge status: Home / Self Care | Payer: Medicare Other | Source: Ambulatory Visit | Attending: Vascular Surgery | Admitting: Vascular Surgery

## 2023-04-04 DIAGNOSIS — I739 Peripheral vascular disease, unspecified: Secondary | ICD-10-CM

## 2023-04-06 DIAGNOSIS — Y92129 Unspecified place in nursing home as the place of occurrence of the external cause: Secondary | ICD-10-CM | POA: Diagnosis not present

## 2023-04-06 DIAGNOSIS — W010XXA Fall on same level from slipping, tripping and stumbling without subsequent striking against object, initial encounter: Secondary | ICD-10-CM | POA: Diagnosis not present

## 2023-04-06 DIAGNOSIS — S51812A Laceration without foreign body of left forearm, initial encounter: Secondary | ICD-10-CM | POA: Diagnosis not present

## 2023-04-06 DIAGNOSIS — Z043 Encounter for examination and observation following other accident: Secondary | ICD-10-CM | POA: Diagnosis not present

## 2023-04-09 DIAGNOSIS — R2689 Other abnormalities of gait and mobility: Secondary | ICD-10-CM | POA: Diagnosis not present

## 2023-04-09 DIAGNOSIS — E119 Type 2 diabetes mellitus without complications: Secondary | ICD-10-CM | POA: Diagnosis not present

## 2023-04-09 DIAGNOSIS — M6281 Muscle weakness (generalized): Secondary | ICD-10-CM | POA: Diagnosis not present

## 2023-04-09 DIAGNOSIS — R1312 Dysphagia, oropharyngeal phase: Secondary | ICD-10-CM | POA: Diagnosis not present

## 2023-04-09 DIAGNOSIS — E0840 Diabetes mellitus due to underlying condition with diabetic neuropathy, unspecified: Secondary | ICD-10-CM | POA: Diagnosis not present

## 2023-04-09 DIAGNOSIS — Z9181 History of falling: Secondary | ICD-10-CM | POA: Diagnosis not present

## 2023-04-09 DIAGNOSIS — E1165 Type 2 diabetes mellitus with hyperglycemia: Secondary | ICD-10-CM | POA: Diagnosis not present

## 2023-04-09 DIAGNOSIS — D649 Anemia, unspecified: Secondary | ICD-10-CM | POA: Diagnosis not present

## 2023-04-09 DIAGNOSIS — G20A1 Parkinson'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 dyskinesia, without mention of fluctuations: Secondary | ICD-10-CM | POA: Diagnosis not present

## 2023-04-09 DIAGNOSIS — E039 Hypothyroidism, unspecified: Secondary | ICD-10-CM | POA: Diagnosis not present

## 2023-04-10 ENCOUNTER — Ambulatory Visit (HOSPITAL_COMMUNITY)
Admission: RE | Admit: 2023-04-10 | Discharge: 2023-04-10 | Disposition: A | Discharge status: Home / Self Care | Payer: Medicare Other | Source: Ambulatory Visit | Attending: Surgery | Admitting: Surgery

## 2023-04-10 DIAGNOSIS — I739 Peripheral vascular disease, unspecified: Secondary | ICD-10-CM | POA: Diagnosis not present

## 2023-04-10 DIAGNOSIS — D649 Anemia, unspecified: Secondary | ICD-10-CM | POA: Diagnosis not present

## 2023-04-10 DIAGNOSIS — G20A1 Parkinson'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 dyskinesia, without mention of fluctuations: Secondary | ICD-10-CM | POA: Diagnosis not present

## 2023-04-10 DIAGNOSIS — E119 Type 2 diabetes mellitus without complications: Secondary | ICD-10-CM | POA: Diagnosis not present

## 2023-04-10 DIAGNOSIS — R1312 Dysphagia, oropharyngeal phase: Secondary | ICD-10-CM | POA: Diagnosis not present

## 2023-04-10 DIAGNOSIS — E039 Hypothyroidism, unspecified: Secondary | ICD-10-CM | POA: Diagnosis not present

## 2023-04-10 DIAGNOSIS — Z9181 History of falling: Secondary | ICD-10-CM | POA: Diagnosis not present

## 2023-04-10 DIAGNOSIS — E1165 Type 2 diabetes mellitus with hyperglycemia: Secondary | ICD-10-CM | POA: Diagnosis not present

## 2023-04-10 DIAGNOSIS — R531 Weakness: Secondary | ICD-10-CM | POA: Diagnosis not present

## 2023-04-10 DIAGNOSIS — R2689 Other abnormalities of gait and mobility: Secondary | ICD-10-CM | POA: Diagnosis not present

## 2023-04-10 DIAGNOSIS — E0840 Diabetes mellitus due to underlying condition with diabetic neuropathy, unspecified: Secondary | ICD-10-CM | POA: Diagnosis not present

## 2023-04-10 DIAGNOSIS — M6281 Muscle weakness (generalized): Secondary | ICD-10-CM | POA: Diagnosis not present

## 2023-04-10 LAB — VAS US ABI WITH/WO TBI
Left ABI: 1.32
Right ABI: 1.3

## 2023-04-16 DIAGNOSIS — E119 Type 2 diabetes mellitus without complications: Secondary | ICD-10-CM | POA: Diagnosis not present

## 2023-04-16 DIAGNOSIS — M6281 Muscle weakness (generalized): Secondary | ICD-10-CM | POA: Diagnosis not present

## 2023-04-16 DIAGNOSIS — R2689 Other abnormalities of gait and mobility: Secondary | ICD-10-CM | POA: Diagnosis not present

## 2023-04-16 DIAGNOSIS — Z9181 History of falling: Secondary | ICD-10-CM | POA: Diagnosis not present

## 2023-04-16 DIAGNOSIS — D649 Anemia, unspecified: Secondary | ICD-10-CM | POA: Diagnosis not present

## 2023-04-16 DIAGNOSIS — G20A1 Parkinson'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 dyskinesia, without mention of fluctuations: Secondary | ICD-10-CM | POA: Diagnosis not present

## 2023-04-16 DIAGNOSIS — R1312 Dysphagia, oropharyngeal phase: Secondary | ICD-10-CM | POA: Diagnosis not present

## 2023-04-16 DIAGNOSIS — R262 Difficulty in walking, not elsewhere classified: Secondary | ICD-10-CM | POA: Diagnosis not present

## 2023-04-17 ENCOUNTER — Encounter: Payer: Self-pay | Admitting: Surgery

## 2023-04-17 ENCOUNTER — Other Ambulatory Visit: Payer: Self-pay

## 2023-04-17 ENCOUNTER — Emergency Department (HOSPITAL_COMMUNITY): Payer: Medicare Other

## 2023-04-17 ENCOUNTER — Encounter (HOSPITAL_COMMUNITY): Payer: Self-pay | Admitting: Internal Medicine

## 2023-04-17 ENCOUNTER — Ambulatory Visit (INDEPENDENT_AMBULATORY_CARE_PROVIDER_SITE_OTHER): Payer: Medicare Other | Admitting: Surgery

## 2023-04-17 ENCOUNTER — Inpatient Hospital Stay (HOSPITAL_COMMUNITY)
Admission: EM | Admit: 2023-04-17 | Discharge: 2023-04-19 | DRG: 951 | Disposition: E | Payer: Medicare Other | Attending: Internal Medicine | Admitting: Internal Medicine

## 2023-04-17 DIAGNOSIS — I252 Old myocardial infarction: Secondary | ICD-10-CM | POA: Diagnosis not present

## 2023-04-17 DIAGNOSIS — E872 Acidosis, unspecified: Secondary | ICD-10-CM | POA: Diagnosis not present

## 2023-04-17 DIAGNOSIS — K219 Gastro-esophageal reflux disease without esophagitis: Secondary | ICD-10-CM | POA: Diagnosis present

## 2023-04-17 DIAGNOSIS — I499 Cardiac arrhythmia, unspecified: Secondary | ICD-10-CM | POA: Diagnosis not present

## 2023-04-17 DIAGNOSIS — Z743 Need for continuous supervision: Secondary | ICD-10-CM | POA: Diagnosis not present

## 2023-04-17 DIAGNOSIS — I959 Hypotension, unspecified: Secondary | ICD-10-CM | POA: Diagnosis present

## 2023-04-17 DIAGNOSIS — A419 Sepsis, unspecified organism: Secondary | ICD-10-CM | POA: Diagnosis present

## 2023-04-17 DIAGNOSIS — Z79890 Hormone replacement therapy (postmenopausal): Secondary | ICD-10-CM

## 2023-04-17 DIAGNOSIS — K55059 Acute (reversible) ischemia of intestine, part and extent unspecified: Secondary | ICD-10-CM | POA: Diagnosis present

## 2023-04-17 DIAGNOSIS — I672 Cerebral atherosclerosis: Secondary | ICD-10-CM | POA: Diagnosis not present

## 2023-04-17 DIAGNOSIS — E1151 Type 2 diabetes mellitus with diabetic peripheral angiopathy without gangrene: Secondary | ICD-10-CM | POA: Diagnosis present

## 2023-04-17 DIAGNOSIS — R6520 Severe sepsis without septic shock: Secondary | ICD-10-CM | POA: Diagnosis present

## 2023-04-17 DIAGNOSIS — E039 Hypothyroidism, unspecified: Secondary | ICD-10-CM | POA: Diagnosis present

## 2023-04-17 DIAGNOSIS — I10 Essential (primary) hypertension: Secondary | ICD-10-CM | POA: Diagnosis present

## 2023-04-17 DIAGNOSIS — Z7984 Long term (current) use of oral hypoglycemic drugs: Secondary | ICD-10-CM | POA: Diagnosis not present

## 2023-04-17 DIAGNOSIS — E8721 Acute metabolic acidosis: Secondary | ICD-10-CM

## 2023-04-17 DIAGNOSIS — I70213 Atherosclerosis of native arteries of extremities with intermittent claudication, bilateral legs: Secondary | ICD-10-CM

## 2023-04-17 DIAGNOSIS — R9431 Abnormal electrocardiogram [ECG] [EKG]: Secondary | ICD-10-CM | POA: Diagnosis not present

## 2023-04-17 DIAGNOSIS — G40909 Epilepsy, unspecified, not intractable, without status epilepticus: Secondary | ICD-10-CM | POA: Diagnosis present

## 2023-04-17 DIAGNOSIS — Z860100 Personal history of colon polyps, unspecified: Secondary | ICD-10-CM

## 2023-04-17 DIAGNOSIS — Z7982 Long term (current) use of aspirin: Secondary | ICD-10-CM | POA: Diagnosis not present

## 2023-04-17 DIAGNOSIS — T68XXXA Hypothermia, initial encounter: Secondary | ICD-10-CM | POA: Diagnosis not present

## 2023-04-17 DIAGNOSIS — Z66 Do not resuscitate: Secondary | ICD-10-CM | POA: Diagnosis present

## 2023-04-17 DIAGNOSIS — G20A1 Parkinson'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 dyskinesia, without mention of fluctuations: Secondary | ICD-10-CM | POA: Diagnosis present

## 2023-04-17 DIAGNOSIS — E785 Hyperlipidemia, unspecified: Secondary | ICD-10-CM | POA: Diagnosis present

## 2023-04-17 DIAGNOSIS — R6889 Other general symptoms and signs: Secondary | ICD-10-CM | POA: Diagnosis not present

## 2023-04-17 DIAGNOSIS — Z7722 Contact with and (suspected) exposure to environmental tobacco smoke (acute) (chronic): Secondary | ICD-10-CM | POA: Diagnosis present

## 2023-04-17 DIAGNOSIS — L89152 Pressure ulcer of sacral region, stage 2: Secondary | ICD-10-CM

## 2023-04-17 DIAGNOSIS — Z515 Encounter for palliative care: Secondary | ICD-10-CM | POA: Diagnosis present

## 2023-04-17 DIAGNOSIS — N179 Acute kidney failure, unspecified: Secondary | ICD-10-CM | POA: Diagnosis present

## 2023-04-17 DIAGNOSIS — F0280 Dementia in other diseases classified elsewhere without behavioral disturbance: Secondary | ICD-10-CM | POA: Diagnosis present

## 2023-04-17 DIAGNOSIS — R680 Hypothermia, not associated with low environmental temperature: Secondary | ICD-10-CM | POA: Diagnosis present

## 2023-04-17 DIAGNOSIS — Z7902 Long term (current) use of antithrombotics/antiplatelets: Secondary | ICD-10-CM | POA: Diagnosis not present

## 2023-04-17 DIAGNOSIS — Z79899 Other long term (current) drug therapy: Secondary | ICD-10-CM

## 2023-04-17 DIAGNOSIS — R404 Transient alteration of awareness: Secondary | ICD-10-CM | POA: Diagnosis present

## 2023-04-17 DIAGNOSIS — E119 Type 2 diabetes mellitus without complications: Secondary | ICD-10-CM

## 2023-04-17 DIAGNOSIS — Z8679 Personal history of other diseases of the circulatory system: Secondary | ICD-10-CM

## 2023-04-17 HISTORY — DX: ST elevation (STEMI) myocardial infarction of unspecified site: I21.3

## 2023-04-17 LAB — URINALYSIS, ROUTINE W REFLEX MICROSCOPIC
Bilirubin Urine: NEGATIVE
Glucose, UA: NEGATIVE mg/dL
Ketones, ur: NEGATIVE mg/dL
Nitrite: NEGATIVE
Protein, ur: 100 mg/dL — AB
RBC / HPF: >50 RBC/hpf (ref 0–5)
Specific Gravity, Urine: 1.017 (ref 1.005–1.030)
WBC, UA: >50 WBC/hpf (ref 0–5)
pH: 5 (ref 5.0–8.0)

## 2023-04-17 LAB — CBC WITH DIFFERENTIAL/PLATELET
Abs Immature Granulocytes: 0.09 10*3/uL — ABNORMAL HIGH (ref 0.00–0.07)
Basophils Absolute: 0 10*3/uL (ref 0.0–0.1)
Basophils Relative: 0 %
Eosinophils Absolute: 0 10*3/uL (ref 0.0–0.5)
Eosinophils Relative: 0 %
HCT: 38.9 % — ABNORMAL LOW (ref 39.0–52.0)
Hemoglobin: 12.5 g/dL — ABNORMAL LOW (ref 13.0–17.0)
Immature Granulocytes: 1 %
Lymphocytes Relative: 6 %
Lymphs Abs: 0.9 10*3/uL (ref 0.7–4.0)
MCH: 29.1 pg (ref 26.0–34.0)
MCHC: 32.1 g/dL (ref 30.0–36.0)
MCV: 90.7 fL (ref 80.0–100.0)
Monocytes Absolute: 0.8 10*3/uL (ref 0.1–1.0)
Monocytes Relative: 5 %
Neutro Abs: 13 10*3/uL — ABNORMAL HIGH (ref 1.7–7.7)
Neutrophils Relative %: 88 %
Platelets: 332 10*3/uL (ref 150–400)
RBC: 4.29 MIL/uL (ref 4.22–5.81)
RDW: 15.2 % (ref 11.5–15.5)
WBC: 14.8 10*3/uL — ABNORMAL HIGH (ref 4.0–10.5)
nRBC: 0.3 % — ABNORMAL HIGH (ref 0.0–0.2)

## 2023-04-17 LAB — COMPREHENSIVE METABOLIC PANEL
ALT: 14 U/L (ref 0–44)
AST: 65 U/L — ABNORMAL HIGH (ref 15–41)
Albumin: 1.8 g/dL — ABNORMAL LOW (ref 3.5–5.0)
Alkaline Phosphatase: 119 U/L (ref 38–126)
Anion gap: 24 — ABNORMAL HIGH (ref 5–15)
BUN: 28 mg/dL — ABNORMAL HIGH (ref 8–23)
CO2: 13 mmol/L — ABNORMAL LOW (ref 22–32)
Calcium: 7.8 mg/dL — ABNORMAL LOW (ref 8.9–10.3)
Chloride: 107 mmol/L (ref 98–111)
Creatinine, Ser: 1.97 mg/dL — ABNORMAL HIGH (ref 0.61–1.24)
GFR, Estimated: 32 mL/min — ABNORMAL LOW (ref >60)
Glucose, Bld: 86 mg/dL (ref 70–99)
Potassium: 4.2 mmol/L (ref 3.5–5.1)
Sodium: 144 mmol/L (ref 135–145)
Total Bilirubin: 1.3 mg/dL — ABNORMAL HIGH (ref 0.3–1.2)
Total Protein: 5.6 g/dL — ABNORMAL LOW (ref 6.5–8.1)

## 2023-04-17 LAB — TROPONIN I (HIGH SENSITIVITY)
Troponin I (High Sensitivity): 49 ng/L — ABNORMAL HIGH (ref <18)
Troponin I (High Sensitivity): 65 ng/L — ABNORMAL HIGH (ref <18)

## 2023-04-17 LAB — I-STAT VENOUS BLOOD GAS, ED
Acid-base deficit: 10 mmol/L — ABNORMAL HIGH (ref 0.0–2.0)
Bicarbonate: 13.9 mmol/L — ABNORMAL LOW (ref 20.0–28.0)
Calcium, Ion: 0.9 mmol/L — ABNORMAL LOW (ref 1.15–1.40)
HCT: 39 % (ref 39.0–52.0)
Hemoglobin: 13.3 g/dL (ref 13.0–17.0)
O2 Saturation: 81 %
Potassium: 4.1 mmol/L (ref 3.5–5.1)
Sodium: 142 mmol/L (ref 135–145)
TCO2: 15 mmol/L — ABNORMAL LOW (ref 22–32)
pCO2, Ven: 26.3 mm[Hg] — ABNORMAL LOW (ref 44–60)
pH, Ven: 7.332 (ref 7.25–7.43)
pO2, Ven: 48 mm[Hg] — ABNORMAL HIGH (ref 32–45)

## 2023-04-17 LAB — I-STAT CG4 LACTIC ACID, ED
Lactic Acid, Venous: 11.9 mmol/L (ref 0.5–1.9)
Lactic Acid, Venous: 14.1 mmol/L (ref 0.5–1.9)

## 2023-04-17 LAB — BRAIN NATRIURETIC PEPTIDE: B Natriuretic Peptide: 270.2 pg/mL — ABNORMAL HIGH (ref 0.0–100.0)

## 2023-04-17 LAB — LIPASE, BLOOD: Lipase: 18 U/L (ref 11–51)

## 2023-04-17 MED ORDER — SODIUM CHLORIDE 0.9 % IV SOLN: 1.0000 g | Freq: Once | INTRAVENOUS | Status: DC

## 2023-04-17 MED ORDER — LORAZEPAM 2 MG/ML PO CONC: 1.0000 mg | ORAL | Status: DC | PRN

## 2023-04-17 MED ORDER — LACTATED RINGERS IV BOLUS
1000.0000 mL | Freq: Once | INTRAVENOUS | Status: AC
  Administered 2023-04-17: 1000 mL via INTRAVENOUS

## 2023-04-17 MED ORDER — MORPHINE 100MG IN NS 100ML (1MG/ML) PREMIX INFUSION
1.0000 mg/h | INTRAVENOUS | Status: DC
  Filled 2023-04-17: qty 100

## 2023-04-17 MED ORDER — ONDANSETRON HCL 4 MG/2ML IJ SOLN: 4.0000 mg | Freq: Four times a day (QID) | INTRAMUSCULAR | Status: DC | PRN

## 2023-04-17 MED ORDER — ONDANSETRON 4 MG PO TBDP: 4.0000 mg | ORAL_TABLET | Freq: Four times a day (QID) | ORAL | Status: DC | PRN

## 2023-04-17 MED ORDER — MORPHINE SULFATE (PF) 4 MG/ML IV SOLN
4.0000 mg | Freq: Once | INTRAVENOUS | Status: AC
  Administered 2023-04-17: 4 mg via INTRAVENOUS
  Filled 2023-04-17: qty 1

## 2023-04-17 MED ORDER — LORAZEPAM 2 MG/ML IJ SOLN: 1.0000 mg | INTRAMUSCULAR | Status: DC | PRN

## 2023-04-17 MED ORDER — MORPHINE BOLUS VIA INFUSION: 1.0000 mg | INTRAVENOUS | Status: DC | PRN

## 2023-04-17 MED ORDER — LORAZEPAM 1 MG PO TABS: 1.0000 mg | ORAL_TABLET | ORAL | Status: DC | PRN

## 2023-04-17 MED ORDER — MORPHINE SULFATE (PF) 2 MG/ML IV SOLN: 1.0000 mg | INTRAVENOUS | Status: DC | PRN

## 2023-04-17 NOTE — ED Provider Notes (Signed)
[place] EMERGENCY DEPARTMENT AT [facility] Provider Note   CSN: [id number] Arrival date & time: 03/24/2023  1230     History  Chief Complaint  Patient presents with   Altered Mental Status    Gerald Leach is a 89 y.o. male.  Patient to ED by EMS, called from vascular medical office where he went for a scheduled appointment. On arrival, patient was altered, nonverbal. Per EMS, blood pressure was 88/60. He was given 500 cc fluids but no recorded subsequent  by EMS. No complete VS reported - no pulse ox, no temperature by EMS. Son was on scene and reports last time he saw him was 2 days ago. No reported vomiting, fever. EMS reports that he is responding to command but remains no verbal.   The history is provided by the EMS personnel. No language interpreter was used.       Home Medications Prior to Admission medications   Medication Sig Start Date End Date Taking? Authorizing Provider  amantadine (SYMMETREL) 100 MG capsule Take 1 capsule (100 mg total) by mouth daily. 05/18/21  Yes Patel, Donika K, DO  amiodarone (PACERONE) 200 MG tablet Take 1 tablet (200 mg total) by mouth daily. 09/15/21  Yes Erick Blinks, MD  aspirin EC 81 MG EC tablet Take 1 tablet (81 mg total) by mouth daily. Swallow whole. 09/15/21  Yes Erick Blinks, MD  carbidopa-levodopa (SINEMET IR) 25-100 MG tablet TAKE 1 TABLET BY MOUTH THREE TIMES A DAY Patient taking differently: Take 1 tablet by mouth 3 (three) times daily. 07/04/21  Yes Patel, Donika K, DO  clopidogrel (PLAVIX) 75 MG tablet Take 1 tablet (75 mg total) by mouth daily. 09/15/21  Yes Erick Blinks, MD  ferrous sulfate 325 (65 FE) MG tablet Take 325 mg by mouth daily with breakfast.   Yes [provider]  Lacosamide 150 MG TABS Take 1 tablet (150 mg total) by mouth 2 (two) times daily. 05/31/21  Yes Patel, Donika K, DO  levETIRAcetam (KEPPRA) 750 MG tablet TAKE 2 TABLETS (1,500 MG TOTAL) BY MOUTH 2 (TWO) TIMES DAILY. 06/26/21  Yes  Patel, Donika K, DO  levothyroxine (SYNTHROID) 75 MCG tablet TAKE 1 TABLET BY MOUTH EVERY DAY 07/04/21  Yes Nafziger, Kandee Keen, NP  metFORMIN (GLUCOPHAGE) 500 MG tablet Take 500 mg by mouth 2 (two) times daily. 11/01/21  Yes [provider]  nitroGLYCERIN (NITROSTAT) 0.4 MG SL tablet Place 1 tablet (0.4 mg total) under the tongue every 5 (five) minutes x 3 doses as needed for chest pain. 09/14/21  Yes Erick Blinks, MD  omeprazole (PRILOSEC) 20 MG capsule TAKE 1 CAPSULE BY MOUTH EVERY DAY Patient taking differently: Take 20 mg by mouth daily. 05/10/21  Yes Nafziger, Kandee Keen, NP  UNABLE TO FIND Take 1 Dose by mouth in the morning and at bedtime. Med Name: house supplement   Yes [provider]  vitamin B-12 (CYANOCOBALAMIN) 1000 MCG tablet Take 1,000 mcg by mouth daily.   Yes [provider]  acetaminophen (TYLENOL) 325 MG tablet Take 2 tablets (650 mg total) by mouth every 6 (six) hours as needed for mild pain (or Fever >/= 101). Patient not taking: Reported on 04/05/2023 03/24/21   [name], DO  atorvastatin (LIPITOR) 40 MG tablet Take 1 tablet (40 mg total) by mouth daily. Patient not taking: Reported on 03/25/2023 09/15/21   Erick Blinks, MD      Allergies    Patient has no known allergies.    Review of  Systems   Review of Systems  Physical Exam Updated Vital Signs BP 111/83   Pulse (!) 108   Temp (!) 94.5 F (34.7 C) (Rectal)   Resp (!) 36   SpO2 95%  Physical Exam Constitutional:      Comments: Awake, in no apparent distress.  HENT:     Head: Atraumatic.     Nose: Nose normal.     Mouth/Throat:     Mouth: Mucous membranes are moist.     Comments: Largely edentulous. Lower front gums discolored, purple, without evidence of bleed or lesion.  Eyes:     Comments: Pupils and equal, sluggish. 2-3 mm bilaterally  Pulmonary:     Effort: Pulmonary effort is normal.     Comments: Poor effort Musculoskeletal:     Comments: Poor perfusion to  extremities, LE > UE's, delayed Cap RF to all extremities.   Neurological:     Comments: Follows command. Will answer yes/no questions. Denies pain. Does not move arms/legs by command. Some voluntary movement.      ED Results / Procedures / Treatments   Labs (all labs ordered are listed, but only abnormal results are displayed) Labs Reviewed  CBC WITH DIFFERENTIAL/PLATELET - Abnormal; Notable for the following components:      Result Value   WBC 14.8 (*)    Hemoglobin 12.5 (*)    HCT 38.9 (*)    nRBC 0.3 (*)    Neutro Abs 13.0 (*)    Abs Immature Granulocytes 0.09 (*)    All other components within normal limits  COMPREHENSIVE METABOLIC PANEL - Abnormal; Notable for the following components:   CO2 13 (*)    BUN 28 (*)    Creatinine, Ser 1.97 (*)    Calcium 7.8 (*)    Total Protein 5.6 (*)    Albumin 1.8 (*)    AST 65 (*)    Total Bilirubin 1.3 (*)    GFR, Estimated 32 (*)    Anion gap 24 (*)    All other components within normal limits  URINALYSIS, ROUTINE W REFLEX MICROSCOPIC - Abnormal; Notable for the following components:   Color, Urine Brown (*)    APPearance TURBID (*)    Hgb urine dipstick SMALL (*)    Protein, ur 100 (*)    Leukocytes,Ua SMALL (*)    Bacteria, UA FEW (*)    All other components within normal limits  BRAIN NATRIURETIC PEPTIDE - Abnormal; Notable for the following components:   B Natriuretic Peptide 270.2 (*)    All other components within normal limits  I-STAT CG4 LACTIC ACID, ED - Abnormal; Notable for the following components:   Lactic Acid, Venous 11.9 (*)    All other components within normal limits  I-STAT VENOUS BLOOD GAS, ED - Abnormal; Notable for the following components:   pCO2, Ven 26.3 (*)    pO2, Ven 48 (*)    Bicarbonate 13.9 (*)    TCO2 15 (*)    Acid-base deficit 10.0 (*)    Calcium, Ion 0.90 (*)    All other components within normal limits  I-STAT CG4 LACTIC ACID, ED - Abnormal; Notable for the following components:    Lactic Acid, Venous 14.1 (*)    All other components within normal limits  TROPONIN I (HIGH SENSITIVITY) - Abnormal; Notable for the following components:   Troponin I (High Sensitivity) 49 (*)    All other components within normal limits  TROPONIN I (HIGH SENSITIVITY) - Abnormal; Notable for the  following components:   Troponin I (High Sensitivity) 65 (*)    All other components within normal limits  LIPASE, BLOOD    EKG EKG Interpretation Date/Time:  Wednesday April 17 2023 13:18:57 EDT Ventricular Rate:  83 PR Interval:    QRS Duration:  130 QT Interval:  507 QTC Calculation: 596 R Axis:   -29  Text Interpretation: Atrial fibrillation IVCD, consider atypical LBBB when compared to prior, new t wave inversions. Confirmed by Theda Belfast ([id number]) on 04/14/2023 1:21:58 PM  Radiology CT Head Wo Contrast  Result Date: 04/02/2023 CLINICAL DATA:  Altered level of consciousness EXAM: CT HEAD WITHOUT CONTRAST TECHNIQUE: Contiguous axial images were obtained from the base of the skull through the vertex without intravenous contrast. RADIATION DOSE REDUCTION: This exam was performed according to the departmental dose-optimization program which includes automated exposure control, adjustment of the mA and/or kV according to patient size and/or use of iterative reconstruction technique. COMPARISON:  03/23/2023 FINDINGS: Brain: No evidence of acute infarct or hemorrhage. Stable cerebral atrophy and chronic small vessel ischemic changes within the periventricular white matter. Stable calcified structure in the right supraclinoid region measuring up to 2.3 cm in size, compatible with suspected calcified right ICA aneurysm as reported on prior studies. Lateral ventricles and remaining midline structures are stable. No acute extra-axial fluid collections. No mass effect. Vascular: Stable presumed calcified supraclinoid right ICA aneurysm as above. Stable atherosclerosis. No hyperdense vessel. Skull:  Postsurgical changes from prior right frontal craniotomy. No acute or destructive bony abnormalities. Sinuses/Orbits: No acute finding. Other: None. IMPRESSION: 1. Stable head CT, no acute intracranial process. 2. Stable calcified structure in the right supraclinoid region, compatible with calcified right ICA aneurysm as reported on prior studies. Electronically Signed   By: Sharlet [name] M.D.   On: 04/14/2023 16:14   DG Chest Portable 1 View  Result Date: 04/13/2023 CLINICAL DATA:  Altered mental status. EXAM: PORTABLE CHEST 1 VIEW COMPARISON:  X-ray 03/23/2023. FINDINGS: Underinflation. Old bilateral rib fractures. Chronic appearing interstitial lung changes. No consolidation, pneumothorax or effusion. No edema. Normal cardiopericardial silhouette with tortuous ectatic aorta. Film is rotated to the left. IMPRESSION: Underinflation.  Chronic changes. Electronically Signed   By: Karen Kays M.D.   On: 03/22/2023 15:21    Procedures .Critical Care  Performed by: Elpidio Anis, PA-C Authorized by: Elpidio Anis, PA-C   Critical care provider statement:    Critical care time (minutes):  75   Critical care start time:  03/30/2023 12:30 PM   Critical care end time:  04/04/2023 6:09 PM   Critical care was necessary to treat or prevent imminent or life-threatening deterioration of the following conditions:  Renal failure, circulatory failure, dehydration and metabolic crisis   Critical care was time spent personally by me on the following activities:  Discussions with consultants, blood draw for specimens, development of treatment plan with patient or surrogate, evaluation of patient's response to treatment, examination of patient, obtaining history from patient or surrogate, ordering and performing treatments and interventions, ordering and review of laboratory studies, ordering and review of radiographic studies, pulse oximetry, re-evaluation of patient's condition and review of old charts   I  assumed direction of critical care for this patient from another provider in my specialty: no     Care discussed with: admitting provider   Comments:     Patient seen by Dr. Rush Landmark who participated in all aspects of care.      Medications Ordered in ED Medications  morphine (PF) 2 MG/ML  injection 1-4 mg (has no administration in time range)  LORazepam (ATIVAN) tablet 1 mg (has no administration in time range)    Or  LORazepam (ATIVAN) 2 MG/ML concentrated solution 1 mg (has no administration in time range)    Or  LORazepam (ATIVAN) injection 1 mg (has no administration in time range)  ondansetron (ZOFRAN-ODT) disintegrating tablet 4 mg (has no administration in time range)    Or  ondansetron (ZOFRAN) injection 4 mg (has no administration in time range)  lactated ringers bolus 1,000 mL (0 mLs Intravenous Stopped 04/09/2023 1746)    ED Course/ Medical Decision Making/ A&P                                 Medical Decision Making This patient presents to the ED for concern of AMS, this involves an extensive number of treatment options, and is a complaint that carries with it a high risk of complications and morbidity.  The differential diagnosis includes stroke, seizure, sepsis/infection, bleed   Co morbidities that complicate the patient evaluation  Dementia, history of vasculopathy   Additional history obtained:  Additional history obtained from EMS External records from outside source obtained and reviewed including medical chart   Lab Tests:  I Ordered, and personally interpreted labs.  The pertinent results include:  Progressively increasing lactic acid, AKI with worsening renal function than previous comparable. Urine ?infection with few bacteria, >50 WBC.  Bicarb 13.9 (low), CO2 26.3 (low), normal pH   Imaging Studies ordered:  I ordered imaging studies including CT head without acute findings, CXR chronic changes, per radiologist interpretation   Cardiac  Monitoring: / EKG:  The patient was maintained on a cardiac monitor.  I personally viewed and interpreted the cardiac monitored which showed an underlying rhythm of: Inverted T-waves and ST depression in lateral leads, new.    Consultations Obtained:  I requested consultation with the Cardiology,  who will see the patient in consultation.  Problem List / ED Course / Critical interventions / Medication management  Patient's son at bedside. Updated on his condition. CT pending. Discussed goals of care and any advanced directives discussed by the family. He states he is to be comfort care. Abx ok if needed. No aggressive or heroic measures.   I ordered medication including morphine, ativan, zofran  for comfort care measures     Social Determinants of Health:  Nonverbal, history of dementia, [place] resident   Test / Admission - Considered:  Patient AMS, per son, started last night, worse this morning. Leaning to the right side. CT head - no acute abnormalities. Patient's temperature is decreasing despite warming blanket. Color improved. Lactic acid worsening 11 --> 14 He appears to be in pain on my reassessment, points to abdomen. Soft abdomen, nondistended. Medication for pain ordered.   Patient declining. Suspect end of life given worsening lactic acidosis, decreasing temperature.   Discussed with hospitalist who accepts for admission.     Amount and/or Complexity of Data Reviewed Labs: ordered. Radiology: ordered.           Final Clinical Impression(s) / ED Diagnoses Final diagnoses:  Hypothermia, initial encounter  Lactic acidosis  Transient alteration of awareness    Rx / DC Orders ED Discharge Orders     None         Elpidio Anis, PA-C 04/13/2023 1811    Tegeler, Canary Brim, MD 04/19/23 [phone]

## 2023-04-17 NOTE — ED Notes (Signed)
ED TO INPATIENT HANDOFF REPORT  ED Nurse Name and Phone #: [phone]  S Name/Age/Gender Gerald Leach 89 y.o. male Room/Bed: 006C/006C  Code Status   Code Status: Do not attempt resuscitation (DNR) - Comfort care  Home/SNF/Other Skilled nursing facility Patient oriented to: self Is this baseline? Yes   Triage Complete: Triage complete  Chief Complaint Admission for terminal care [Z51.5]  Triage Note Pt coming from [place], only opening eyes to name. Baseline patient talks but unable to make sense of what he is saying. Baseline per family Monday, yesterday wouldn't speak, today on way to vascular not speaking. Able to lift arms and legs, follow commands. Trying to force air out to speak but unable to get words out.   88/60 to 124/77 Afib - Plavix - 100 BPM 83 CBG - insulin dependent  20 RAC - 500 NS    Allergies No Known Allergies  Level of Care/Admitting Diagnosis ED Disposition     ED Disposition  Admit   Condition  --   Comment  Hospital Area: [facility] [[id number]]  Level of Care: Med-Surg [16]  May admit patient to [facility] or [facility] if equivalent level of care is available:: No  Covid Evaluation: Asymptomatic - no recent exposure (last 10 days) testing not required  Diagnosis: Admission for terminal care [130865]  Admitting Physician: Imogene Burn, ERIC [[id number]]  Attending Physician: Imogene Burn, ERIC [[id number]]  Certification:: I certify this patient will need inpatient services for at least 2 midnights  Expected Medical Readiness: 04/19/2023          B Medical/Surgery History Past Medical History:  Diagnosis Date   Acute ST elevation myocardial infarction (STEMI) of anterior wall (HCC) 09/03/2021   Brain aneurysm    Colonic polyp 2003   Dementia (HCC)    Diabetes mellitus without complication (HCC)    Drug-induced delirium 05/16/2013   IMO SNOMED Dx Update Oct 2024     ED (erectile dysfunction)    Fall at home 10/02/2014   GERD  (gastroesophageal reflux disease)    Hyperlipidemia    Hypertension    Hypothyroidism    Seizures (HCC)    per family   Seizures (HCC)    ST elevation myocardial infarction (STEMI) (HCC)    Subdural hematoma (HCC) 09/20/2012   Syncope 02/05/2014   Past Surgical History:  Procedure Laterality Date   Aneurysmal clipping     brain aneurysm surgery     at [facility] 2002   carotid artery aneurysm     COLONOSCOPY     polyps   CRANIOTOMY Right 09/19/2012   Procedure: CRANIOTOMY HEMATOMA EVACUATION SUBDURAL;  Surgeon: Clydene Fake, MD;  Location: [facility]UR[facility];  Service: Neurosurgery;  Laterality: Right;   CRANIOTOMY Right 09/22/2012   Procedure: CRANIOTOMY HEMATOMA EVACUATION SUBDURAL;  Surgeon: Clydene Fake, MD;  Location: [facility] NEUR[facility] ORS;  Service: Neurosurgery;  Laterality: Right;   Craniotomy.     right hernia       A IV Location/Drains/Wounds Patient Lines/Drains/Airways Status     Active Line/Drains/Airways     Name Placement date Placement time Site Days   Peripheral IV 04/17/23 20 G 1" Right Antecubital 04/17/23  --  Antecubital  less than 1            Intake/Output Last 24 hours  Intake/Output Summary (Last 24 hours) at 04/17/2023 1836 Last data filed at 04/17/2023 1403 Gross per 24 hour  Intake --  Output 500 ml  Net -500 ml  Labs/Imaging Results for orders placed or performed during the hospital encounter of 04/17/23 (from the past 48 hour(s))  CBC with Differential     Status: Abnormal   Collection Time: 04/17/23  1:14 PM  Result Value Ref Range   WBC 14.8 (H) 4.0 - 10.5 K/uL   RBC 4.29 4.22 - 5.81 MIL/uL   Hemoglobin 12.5 (L) 13.0 - 17.0 g/dL   HCT 60.4 (L) 54.0 - 98.1 %   MCV 90.7 80.0 - 100.0 fL   MCH 29.1 26.0 - 34.0 pg   MCHC 32.1 30.0 - 36.0 g/dL   RDW 19.1 47.8 - 29.5 %   Platelets 332 150 - 400 K/uL   nRBC 0.3 (H) 0.0 - 0.2 %   Neutrophils Relative % 88 %   Neutro Abs 13.0 (H) 1.7 - 7.7 K/uL   Lymphocytes Relative 6 %   Lymphs Abs 0.9 0.7 -  4.0 K/uL   Monocytes Relative 5 %   Monocytes Absolute 0.8 0.1 - 1.0 K/uL   Eosinophils Relative 0 %   Eosinophils Absolute 0.0 0.0 - 0.5 K/uL   Basophils Relative 0 %   Basophils Absolute 0.0 0.0 - 0.1 K/uL   Immature Granulocytes 1 %   Abs Immature Granulocytes 0.09 (H) 0.00 - 0.07 K/uL    Comment: Performed at [facility], [address], [place], [place] [address]  Comprehensive metabolic panel     Status: Abnormal   Collection Time: 04/17/23  1:14 PM  Result Value Ref Range   Sodium 144 135 - 145 mmol/L   Potassium 4.2 3.5 - 5.1 mmol/L   Chloride 107 98 - 111 mmol/L   CO2 13 (L) 22 - 32 mmol/L   Glucose, Bld 86 70 - 99 mg/dL    Comment: Glucose reference range applies only to samples taken after fasting for at least 8 hours.   BUN 28 (H) 8 - 23 mg/dL   Creatinine, Ser 8.65 (H) 0.61 - 1.24 mg/dL   Calcium 7.8 (L) 8.9 - 10.3 mg/dL   Total Protein 5.6 (L) 6.5 - 8.1 g/dL   Albumin 1.8 (L) 3.5 - 5.0 g/dL   AST 65 (H) 15 - 41 U/L   ALT 14 0 - 44 U/L    Comment: RESULT CONFIRMED BY MANUAL DILUTION   Alkaline Phosphatase 119 38 - 126 U/L   Total Bilirubin 1.3 (H) 0.3 - 1.2 mg/dL   GFR, Estimated 32 (L) >60 mL/min    Comment: (NOTE) Calculated using the CKD-EPI Creatinine Equation (2021)    Anion gap 24 (H) 5 - 15    Comment: ELECTROLYTES REPEATED TO VERIFY Performed at [facility], [address] [place], [place] [address]   Lipase, blood     Status: None   Collection Time: 04/17/23  1:14 PM  Result Value Ref Range   Lipase 18 11 - 51 U/L    Comment: Performed at [facility], [address] [place], [place] [address]  Troponin I (High Sensitivity)     Status: Abnormal   Collection Time: 04/17/23  1:14 PM  Result Value Ref Range   Troponin I (High Sensitivity) 49 (H) <18 ng/L    Comment: (NOTE) Elevated high sensitivity troponin I (hsTnI) values and significant  changes across serial measurements may suggest ACS but many other  chronic and acute  conditions are known to elevate hsTnI results.  Refer to the "Links" section for chest pain algorithms and additional  guidance. Performed at [facility], [address] [place], [place] [address]   Brain natriuretic peptide     Status: Abnormal   Collection Time: 04/17/23  1:14 PM  Result Value Ref Range   B Natriuretic Peptide 270.2 (H) 0.0 - 100.0 pg/mL    Comment: Performed at [facility], [address] [place], [place] [address]  I-Stat Lactic Acid     Status: Abnormal   Collection Time: 04/17/23  1:21 PM  Result Value Ref Range   Lactic Acid, Venous 11.9 (HH) 0.5 - 1.9 mmol/L   Comment NOTIFIED PHYSICIAN   I-Stat venous blood gas, ED     Status: Abnormal   Collection Time: 04/17/23  1:21 PM  Result Value Ref Range   pH, Ven 7.332 7.25 - 7.43   pCO2, Ven 26.3 (L) 44 - 60 mmHg   pO2, Ven 48 (H) 32 - 45 mmHg   Bicarbonate 13.9 (L) 20.0 - 28.0 mmol/L   TCO2 15 (L) 22 - 32 mmol/L   O2 Saturation 81 %   Acid-base deficit 10.0 (H) 0.0 - 2.0 mmol/L   Sodium 142 135 - 145 mmol/L   Potassium 4.1 3.5 - 5.1 mmol/L   Calcium, Ion 0.90 (L) 1.15 - 1.40 mmol/L   HCT 39.0 39.0 - 52.0 %   Hemoglobin 13.3 13.0 - 17.0 g/dL   Sample type VENOUS   Urinalysis, Routine w reflex microscopic -Urine, Catheterized     Status: Abnormal   Collection Time: 04/17/23  2:03 PM  Result Value Ref Range   Color, Urine Brown (A) YELLOW   APPearance TURBID (A) CLEAR   Specific Gravity, Urine 1.017 1.005 - 1.030   pH 5.0 5.0 - 8.0   Glucose, UA NEGATIVE NEGATIVE mg/dL   Hgb urine dipstick SMALL (A) NEGATIVE   Bilirubin Urine NEGATIVE NEGATIVE   Ketones, ur NEGATIVE NEGATIVE mg/dL   Protein, ur 098 (A) NEGATIVE mg/dL   Nitrite NEGATIVE NEGATIVE   Leukocytes,Ua SMALL (A) NEGATIVE   RBC / HPF >50 0 - 5 RBC/hpf   WBC, UA >50 0 - 5 WBC/hpf   Bacteria, UA FEW (A) NONE SEEN   Squamous Epithelial / HPF 0-5 0 - 5 /HPF   WBC Clumps PRESENT    Mucus PRESENT     Comment: Performed at [facility], [address] [place], [place] [address]  Troponin I (High Sensitivity)     Status: Abnormal   Collection Time: 04/17/23  4:54 PM  Result Value Ref Range   Troponin I (High Sensitivity) 65 (H) <18 ng/L    Comment: (NOTE) Elevated high sensitivity troponin I (hsTnI) values and significant  changes across serial measurements may suggest ACS but many other  chronic and acute conditions are known to elevate hsTnI results.  Refer to the "Links" section for chest pain algorithms and additional  guidance. Performed at [facility], [address], [place], [place] [address]   I-Stat Lactic Acid     Status: Abnormal   Collection Time: 04/17/23  5:02 PM  Result Value Ref Range   Lactic Acid, Venous 14.1 (HH) 0.5 - 1.9 mmol/L   Comment NOTIFIED PHYSICIAN    CT Head Wo Contrast  Result Date: 04/17/2023 CLINICAL DATA:  Altered level of consciousness EXAM: CT HEAD WITHOUT CONTRAST TECHNIQUE: Contiguous axial images were obtained from the base of the skull through the vertex without intravenous contrast. RADIATION DOSE REDUCTION: This exam was performed according to the departmental dose-optimization program which includes  automated exposure control, adjustment of the mA and/or kV according to patient size and/or use of iterative reconstruction technique. COMPARISON:  03/23/2023 FINDINGS: Brain: No evidence of acute infarct or hemorrhage. Stable cerebral atrophy and chronic small vessel ischemic changes within the periventricular white matter. Stable calcified structure in the right supraclinoid region measuring up to 2.3 cm in size, compatible with suspected calcified right ICA aneurysm as reported on prior studies. Lateral ventricles and remaining midline structures are stable. No acute extra-axial fluid collections. No mass effect. Vascular: Stable presumed calcified supraclinoid right ICA aneurysm as above. Stable atherosclerosis. No hyperdense vessel. Skull: Postsurgical changes  from prior right frontal craniotomy. No acute or destructive bony abnormalities. Sinuses/Orbits: No acute finding. Other: None. IMPRESSION: 1. Stable head CT, no acute intracranial process. 2. Stable calcified structure in the right supraclinoid region, compatible with calcified right ICA aneurysm as reported on prior studies. Electronically Signed   By: Sharlet [name] M.D.   On: 04/17/2023 16:14   DG Chest Portable 1 View  Result Date: 04/17/2023 CLINICAL DATA:  Altered mental status. EXAM: PORTABLE CHEST 1 VIEW COMPARISON:  X-ray 03/23/2023. FINDINGS: Underinflation. Old bilateral rib fractures. Chronic appearing interstitial lung changes. No consolidation, pneumothorax or effusion. No edema. Normal cardiopericardial silhouette with tortuous ectatic aorta. Film is rotated to the left. IMPRESSION: Underinflation.  Chronic changes. Electronically Signed   By: Karen Kays M.D.   On: 04/17/2023 15:21    Pending Labs Unresulted Labs (From admission, onward)    None       Vitals/Pain Today's Vitals   04/17/23 1715 04/17/23 1745 04/17/23 1800 04/17/23 1830  BP: 110/84 108/86 111/83 (!) 112/58  Pulse:  81 (!) 108 (!) 105  Resp: (!) 28 (!) 32 (!) 36 (!) 33  Temp: (!) 94.5 F (34.7 C)     TempSrc: Rectal     SpO2:  94% 95% 96%    Isolation Precautions No active isolations  Medications Medications  morphine (PF) 2 MG/ML injection 1-4 mg (has no administration in time range)  LORazepam (ATIVAN) tablet 1 mg (has no administration in time range)    Or  LORazepam (ATIVAN) 2 MG/ML concentrated solution 1 mg (has no administration in time range)    Or  LORazepam (ATIVAN) injection 1 mg (has no administration in time range)  ondansetron (ZOFRAN-ODT) disintegrating tablet 4 mg (has no administration in time range)    Or  ondansetron (ZOFRAN) injection 4 mg (has no administration in time range)  morphine (PF) 4 MG/ML injection 4 mg (has no administration in time range)  lactated ringers  bolus 1,000 mL (0 mLs Intravenous Stopped 04/17/23 1746)    Mobility walks with device, has not walked here     Focused Assessments Cardiac Assessment Handoff:  Cardiac Rhythm: Atrial fibrillation Lab Results  Component Value Date   TROPONINI <0.03 08/05/2015   Lab Results  Component Value Date   DDIMER 1.26 (H) 09/04/2021   Does the Patient currently have chest pain? No   , Neuro Assessment Handoff:  Swallow screen pass? No  Cardiac Rhythm: Atrial fibrillation       Neuro Assessment: Exceptions to WDL Neuro Checks:      Has TPA been given? No If patient is a Neuro Trauma and patient is going to OR before floor call report to 4N Charge nurse: [phone] or [phone]   R Recommendations: See Admitting Provider Note  Report given to:   Additional Notes:

## 2023-04-17 NOTE — Progress Notes (Signed)
Vascular and Vein Specialist of [place]  Patient name: Gerald Leach MRN: [id number] DOB: [birth year] Sex: male   REQUESTING PROVIDER:   [facility]   REASON FOR CONSULT:    Abnormal vascular lab studies  HISTORY OF PRESENT ILLNESS:   Gerald Leach is a 89 y.o. male, who is referred for evaluation of abnormal arterial Doppler studies.  Patient is unresponsive  PAST MEDICAL HISTORY    Past Medical History:  Diagnosis Date   Brain aneurysm    Colonic polyp 2003   Dementia (HCC)    Diabetes mellitus without complication (HCC)    ED (erectile dysfunction)    GERD (gastroesophageal reflux disease)    Hyperlipidemia    Hypertension    Hypothyroidism    Seizures (HCC)    per family   Seizures (HCC)      FAMILY HISTORY   Family History  Problem Relation Age of Onset   Liver disease Brother        Died   Seizures Neg Hx     SOCIAL HISTORY:   Social History   Socioeconomic History   Marital status: Widowed    Spouse name: Not on file   Number of children: 4   Years of education: 72   Highest education level: 12th grade  Occupational History   Occupation: retired  Tobacco Use   Smoking status: Never    Passive exposure: Past   Smokeless tobacco: Never  Vaping Use   Vaping status: Never Used  Substance and Sexual Activity   Alcohol use: Not Currently    Comment: rum mixed in diet coke 3 a week   Drug use: Not Currently    Types: Nitrous oxide   Sexual activity: Not Currently  Other Topics Concern   Not on file  Social History Narrative   ** Merged History Encounter **     Has no trouble with the stairs as long as he holds on to the banister.  Education: college.  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     Social Determinants of Health   Financial Resource Strain: Low Risk  (06/26/2022)   Overall Financial Resource Strain (CARDIA)    Difficulty of Paying Living Expenses: Not hard at  all  Food Insecurity: No Food Insecurity (06/26/2022)   Hunger Vital Sign    Worried About Running Out of Food in the Last Year: Never true    Ran Out of Food in the Last Year: Never true  Transportation Needs: No Transportation Needs (06/26/2022)   PRAPARE - Administrator, Civil Service (Medical): No    Lack of Transportation (Non-Medical): No  Physical Activity: Insufficiently Active (06/14/2021)   Exercise Vital Sign    Days of Exercise per Week: 5 days    Minutes of Exercise per Session: 20 min  Stress: No Stress Concern Present (06/26/2022)   [employer] of Occupational Health - Occupational Stress Questionnaire    Feeling of Stress : Not at all  Social Connections: Moderately Isolated (06/14/2021)   Social Connection and Isolation Panel [NHANES]    Frequency of Communication with Friends and Family: More than three times a week    Frequency of Social Gatherings with Friends and Family: More than three times a week    Attends Religious Services: Never    Database administrator or Organizations: No    Attends Banker Meetings: Never    Marital Status: Married  Catering manager Violence: Not  At Risk (06/26/2022)   Humiliation, Afraid, Rape, and Kick questionnaire    Fear of Current or Ex-Partner: No    Emotionally Abused: No    Physically Abused: No    Sexually Abused: No    ALLERGIES:    No Known Allergies  CURRENT MEDICATIONS:    Current Outpatient Medications  Medication Sig Dispense Refill   acetaminophen (TYLENOL) 325 MG tablet Take 2 tablets (650 mg total) by mouth every 6 (six) hours as needed for mild pain (or Fever >/= 101).     amantadine (SYMMETREL) 100 MG capsule Take 1 capsule (100 mg total) by mouth daily. 90 capsule 1   amiodarone (PACERONE) 200 MG tablet Take 1 tablet (200 mg total) by mouth daily.     aspirin EC 81 MG EC tablet Take 1 tablet (81 mg total) by mouth daily. Swallow whole. 30 tablet 11   atorvastatin (LIPITOR) 40 MG  tablet Take 1 tablet (40 mg total) by mouth daily.     carbidopa-levodopa (SINEMET IR) 25-100 MG tablet TAKE 1 TABLET BY MOUTH THREE TIMES A DAY (Patient taking differently: Take 1 tablet by mouth 3 (three) times daily.) 270 tablet 0   clopidogrel (PLAVIX) 75 MG tablet Take 1 tablet (75 mg total) by mouth daily.     ferrous sulfate 325 (65 FE) MG tablet Take 325 mg by mouth daily with breakfast.     Lacosamide 150 MG TABS Take 1 tablet (150 mg total) by mouth 2 (two) times daily. 60 tablet 5   levETIRAcetam (KEPPRA) 750 MG tablet TAKE 2 TABLETS (1,500 MG TOTAL) BY MOUTH 2 (TWO) TIMES DAILY. 360 tablet 3   levothyroxine (SYNTHROID) 50 MCG tablet Take 50 mcg by mouth every morning. On an empty stomach     levothyroxine (SYNTHROID) 75 MCG tablet TAKE 1 TABLET BY MOUTH EVERY DAY 90 tablet 1   LORazepam (ATIVAN) 1 MG tablet Take 1 tablet (1 mg total) by mouth every 6 (six) hours as needed for anxiety (dyspnea). 30 tablet 0   metFORMIN (GLUCOPHAGE) 500 MG tablet Take 500 mg by mouth 2 (two) times daily.     nitroGLYCERIN (NITROSTAT) 0.4 MG SL tablet Place 1 tablet (0.4 mg total) under the tongue every 5 (five) minutes x 3 doses as needed for chest pain.  12   omeprazole (PRILOSEC) 20 MG capsule TAKE 1 CAPSULE BY MOUTH EVERY DAY (Patient taking differently: Take 20 mg by mouth daily.) 90 capsule 3   ondansetron (ZOFRAN) 4 MG tablet Take 1 tablet (4 mg total) by mouth every 6 (six) hours as needed for nausea. 20 tablet 0   vitamin B-12 (CYANOCOBALAMIN) 1000 MCG tablet Take 1,000 mcg by mouth daily.     No current facility-administered medications for this visit.    REVIEW OF SYSTEMS:   [X]  denotes positive finding, [ ]  denotes negative finding Cardiac  Comments:  Chest pain or chest pressure:    Shortness of breath upon exertion:    Short of breath when lying flat:    Irregular heart rhythm:        Vascular    Pain in calf, thigh, or hip brought on by ambulation:    Pain in feet at night that  wakes you up from your sleep:     Blood clot in your veins:    Leg swelling:         Pulmonary    Oxygen at home:    Productive cough:     Wheezing:  Neurologic    Sudden weakness in arms or legs:     Sudden numbness in arms or legs:     Sudden onset of difficulty speaking or slurred speech:    Temporary loss of vision in one eye:     Problems with dizziness:         Gastrointestinal    Blood in stool:      Vomited blood:         Genitourinary    Burning when urinating:     Blood in urine:        Psychiatric    Major depression:         Hematologic    Bleeding problems:    Problems with blood clotting too easily:        Skin    Rashes or ulcers:        Constitutional    Fever or chills:     PHYSICAL EXAM:   There were no vitals filed for this visit.  The patient was not responsive.  His son is at the bedside and states that he is not acting his normal self.  He was different yesterday but was totally normal on Monday.  He has guppy type breathing.  Faint pulse  STUDIES:   I have reviewed his ultrasound with the following results: +-------+-----------+-----------+------------+------------+  ABI/TBIToday's ABIToday's TBIPrevious ABIPrevious TBI  +-------+-----------+-----------+------------+------------+  Right 1.30       0.96       1.28        1.04          +-------+-----------+-----------+------------+------------+  Left  1.32       0.84       1.28        0.91          +-------+-----------+-----------+------------+------------+  Right toe pressure: 93 Left toe pressure: 81  ASSESSMENT and PLAN   Due to the patient's clinical condition, EMS was called.  When they arrived the patient's condition was unchanged.  He was assessed and taken to [facility].   Durene Cal, IV, MD, [facility] Ve[facility] Specialists [facility] [phone] Pager [phone]

## 2023-04-17 NOTE — Hospital Course (Signed)
CC: unresponsive HPI: 89 year old male history of dementia with Parkinson's disease, history of seizure disorder, history of ventricular tachycardia, coronary disease, peripheral arterial disease, history of subdural hematoma who presents from the vascular surgery office in an unresponsive state.  He was brought to vascular surgery today for evaluation of his peripheral arterial disease.  Patient lives in [place].  He was unresponsive at the vascular surgeons office and sent to the ER for evaluation.  On arrival to the ER temp 98.5 heart rate 74 blood pressure 120/81.  Blood pressure subsequently dropped to 85/73.  He remained hypothermic.  Initial lactic acid of 11.9.  After IV fluids, lactic acid increased to 14.1.  White count 14.8, hemoglobin 12.5, platelets of 332  Sodium 144, potassium 4.2, bicarb 13, BUN of 28, creatinine 1.98  CT head showed no acute process.  Chest x-ray showed old bilateral rib fractures.  Patient given 1 L of IV lactated Ringer's.  EDP discussed the case with the patient's son [name] who wanted the patient to be kept comfortable.  Patient made DNR comfort care by EDP.  I subsequently called and spoke with the patient's son [name].  Reconfirmed that the patient is DNR.  Explained to him that I thought the patient had a catastrophic intra-abdominal event most likely intestinal ischemia given rising lactic acid, hypothermia, hypotension.  I do not think the patient has a survivable diagnosis.  Patient appears to be actively dying.  Son agrees to comfort care measures.

## 2023-04-17 NOTE — ED Notes (Signed)
CT tech informed this Clinical research associate that they were taking PT to CT.

## 2023-04-17 NOTE — Assessment & Plan Note (Signed)
Admit for terminal care.  Given his rising lactic acid and is tender abdomen, I suspect that he has had some sort of catastrophic abdominal event.  Most likely intestinal ischemia.  Discussed this with the patient's son [name].  Discussed that the patient appears to be actively dying and that no amount of treatment will turn this around.  Son agrees.  Will stop any antibiotics.  Stop all IV fluids.  Start IV morphine for pain.  He may need a PCA.  Told the son that I fully expect the patient to die in the hospital.  Son does not want him to suffer.  I agree with this approach.   patient expired on April 17, 2000 at 0150 hrs.  Family was notified.  Patient has not candidate for organ donation due to sepsis from probable intestinal ischemia.

## 2023-04-17 NOTE — H&P (Signed)
History and Physical    JEWELZ ISIP UEA:[id number] DOB: 1933-12-25 DOA: 04/17/2023  DOS: the patient was seen and examined on 04/17/2023  PCP: Shirline Frees, NP   Patient coming from: SNF  I have personally briefly reviewed patient's old medical records in [place] [facility]  CC: unresponsive HPI: 89 year old male history of dementia with Parkinson's disease, history of seizure disorder, history of ventricular tachycardia, coronary disease, peripheral arterial disease, history of subdural hematoma who presents from the vascular surgery office in an unresponsive state.  He was brought to vascular surgery today for evaluation of his peripheral arterial disease.  Patient lives in [place].  He was unresponsive at the vascular surgeons office and sent to the ER for evaluation.  On arrival to the ER temp 98.5 heart rate 74 blood pressure 120/81.  Blood pressure subsequently dropped to 85/73.  He remained hypothermic.  Initial lactic acid of 11.9.  After IV fluids, lactic acid increased to 14.1.  White count 14.8, hemoglobin 12.5, platelets of 332  Sodium 144, potassium 4.2, bicarb 13, BUN of 28, creatinine 1.98  CT head showed no acute process.  Chest x-ray showed old bilateral rib fractures.  Patient given 1 L of IV lactated Ringer's.  EDP discussed the case with the patient's son [name] who wanted the patient to be kept comfortable.  Patient made DNR comfort care by EDP.  I subsequently called and spoke with the patient's son [name].  Reconfirmed that the patient is DNR.  Explained to him that I thought the patient had a catastrophic intra-abdominal event most likely intestinal ischemia given rising lactic acid, hypothermia, hypotension.  I do not think the patient has a survivable diagnosis.  Patient appears to be actively dying.  Son agrees to comfort care measures.   ED Course: initial lactic acid 11.9. after 1 L IVF, lactic acid increased to 14.1  Review of Systems:   Review of Systems  Unable to perform ROS: Patient unresponsive    Past Medical History:  Diagnosis Date   Acute ST elevation myocardial infarction (STEMI) of anterior wall (HCC) 09/03/2021   Brain aneurysm    Colonic polyp 2003   Dementia (HCC)    Diabetes mellitus without complication (HCC)    Drug-induced delirium 05/16/2013   IMO SNOMED Dx Update Oct 2024     ED (erectile dysfunction)    Fall at home 10/02/2014   GERD (gastroesophageal reflux disease)    Hyperlipidemia    Hypertension    Hypothyroidism    Seizures (HCC)    per family   Seizures (HCC)    ST elevation myocardial infarction (STEMI) (HCC)    Subdural hematoma (HCC) 09/20/2012   Syncope 02/05/2014    Past Surgical History:  Procedure Laterality Date   Aneurysmal clipping     brain aneurysm surgery     at [facility]   carotid artery aneurysm     COLONOSCOPY     polyps   CRANIOTOMY Right 09/19/2012   Procedure: CRANIOTOMY HEMATOMA EVACUATION SUBDURAL;  Surgeon: Clydene Fake, MD;  Location: [facility];  Service: Neurosurgery;  Laterality: Right;   CRANIOTOMY Right 09/22/2012   Procedure: CRANIOTOMY HEMATOMA EVACUATION SUBDURAL;  Surgeon: Clydene Fake, MD;  Location: [facility];  Service: Neurosurgery;  Laterality: Right;   Craniotomy.     right hernia       reports that he has never smoked. He has been exposed to tobacco smoke. He has never used smokeless tobacco. He reports that he does  not currently use alcohol. He reports that he does not currently use drugs after having used the following drugs: Nitrous oxide.  No Known Allergies  Family History  Problem Relation Age of Onset   Liver disease Brother        Died   Seizures Neg Hx     Prior to Admission medications   Medication Sig Start Date End Date Taking? Authorizing Provider  amantadine (SYMMETREL) 100 MG capsule Take 1 capsule (100 mg total) by mouth daily. 05/18/21  Yes Patel, Donika K, DO  amiodarone (PACERONE) 200 MG tablet Take 1  tablet (200 mg total) by mouth daily. 09/15/21  Yes Erick Blinks, MD  aspirin EC 81 MG EC tablet Take 1 tablet (81 mg total) by mouth daily. Swallow whole. 09/15/21  Yes Erick Blinks, MD  carbidopa-levodopa (SINEMET IR) 25-100 MG tablet TAKE 1 TABLET BY MOUTH THREE TIMES A DAY Patient taking differently: Take 1 tablet by mouth 3 (three) times daily. 07/04/21  Yes Patel, Donika K, DO  clopidogrel (PLAVIX) 75 MG tablet Take 1 tablet (75 mg total) by mouth daily. 09/15/21  Yes Erick Blinks, MD  ferrous sulfate 325 (65 FE) MG tablet Take 325 mg by mouth daily with breakfast.   Yes [provider]  Lacosamide 150 MG TABS Take 1 tablet (150 mg total) by mouth 2 (two) times daily. 05/31/21  Yes Patel, Donika K, DO  levETIRAcetam (KEPPRA) 750 MG tablet TAKE 2 TABLETS (1,500 MG TOTAL) BY MOUTH 2 (TWO) TIMES DAILY. 06/26/21  Yes Patel, Donika K, DO  levothyroxine (SYNTHROID) 75 MCG tablet TAKE 1 TABLET BY MOUTH EVERY DAY 07/04/21  Yes Nafziger, Kandee Keen, NP  metFORMIN (GLUCOPHAGE) 500 MG tablet Take 500 mg by mouth 2 (two) times daily. 11/01/21  Yes [provider]  nitroGLYCERIN (NITROSTAT) 0.4 MG SL tablet Place 1 tablet (0.4 mg total) under the tongue every 5 (five) minutes x 3 doses as needed for chest pain. 09/14/21  Yes Erick Blinks, MD  omeprazole (PRILOSEC) 20 MG capsule TAKE 1 CAPSULE BY MOUTH EVERY DAY Patient taking differently: Take 20 mg by mouth daily. 05/10/21  Yes Nafziger, Kandee Keen, NP  UNABLE TO FIND Take 1 Dose by mouth in the morning and at bedtime. Med Name: house supplement   Yes [provider]  vitamin B-12 (CYANOCOBALAMIN) 1000 MCG tablet Take 1,000 mcg by mouth daily.   Yes [provider]  acetaminophen (TYLENOL) 325 MG tablet Take 2 tablets (650 mg total) by mouth every 6 (six) hours as needed for mild pain (or Fever >/= 101). Patient not taking: Reported on 04/17/2023 03/24/21   [name], DO  atorvastatin (LIPITOR) 40 MG tablet Take 1  tablet (40 mg total) by mouth daily. Patient not taking: Reported on 04/17/2023 09/15/21   Erick Blinks, MD    Physical Exam: Vitals:   04/17/23 1645 04/17/23 1715 04/17/23 1745 04/17/23 1800  BP: 113/75 110/84 108/86 111/83  Pulse:   81 (!) 108  Resp: (!) 32 (!) 28 (!) 32 (!) 36  Temp:  (!) 94.5 F (34.7 C)    TempSrc:  Rectal    SpO2:   94% 95%    Physical Exam Vitals and nursing note reviewed.  Constitutional:      Appearance: He is ill-appearing, toxic-appearing and diaphoretic.     Comments: Appears to be actively dying  HENT:     Head:     Comments: Bitemporal muscle wasting Cardiovascular:     Rate and Rhythm: Regular rhythm.  Tachycardia present.  Abdominal:     General: There is no distension.     Tenderness: There is abdominal tenderness.  Skin:    Comments: Cold finger tips  Neurological:     Comments: unresponsive      Labs on Admission: I have personally reviewed following labs and imaging studies  CBC: Recent Labs  Lab 04/17/23 1314 04/17/23 1321  WBC 14.8*  --   NEUTROABS 13.0*  --   HGB 12.5* 13.3  HCT 38.9* 39.0  MCV 90.7  --   PLT 332  --    Basic Metabolic Panel: Recent Labs  Lab 04/17/23 1314 04/17/23 1321  NA 144 142  K 4.2 4.1  CL 107  --   CO2 13*  --   GLUCOSE 86  --   BUN 28*  --   CREATININE 1.97*  --   CALCIUM 7.8*  --    GFR: CrCl cannot be calculated (Unknown ideal weight.). Liver Function Tests: Recent Labs  Lab 04/17/23 1314  AST 65*  ALT 14  ALKPHOS 119  BILITOT 1.3*  PROT 5.6*  ALBUMIN 1.8*   Recent Labs  Lab 04/17/23 1314  LIPASE 18   Cardiac Enzymes: Recent Labs  Lab 04/17/23 1314 04/17/23 1654  TROPONINIHS 49* 65*   BNP (last 3 results) Recent Labs    03/23/23 1728 04/17/23 1314  BNP 120.8* 270.2*   Urine analysis:    Component Value Date/Time   [place] [facility] (A) 04/17/2023 1403   APPEARANCEUR TURBID (A) 04/17/2023 1403   LABSPEC 1.017 04/17/2023 1403   PHURINE 5.0 04/17/2023  1403   GLUCOSEU NEGATIVE 04/17/2023 1403   HGBUR SMALL (A) 04/17/2023 1403   HGBUR trace-intact 02/22/2010 0850   BILIRUBINUR NEGATIVE 04/17/2023 1403   BILIRUBINUR 1+ 03/11/2018 1808   KETONESUR NEGATIVE 04/17/2023 1403   PROTEINUR 100 (A) 04/17/2023 1403   UROBILINOGEN 4.0 (A) 03/11/2018 1808   UROBILINOGEN 1.0 10/02/2014 2242   NITRITE NEGATIVE 04/17/2023 1403   LEUKOCYTESUR SMALL (A) 04/17/2023 1403    Radiological Exams on Admission: I have personally reviewed images CT Head Wo Contrast  Result Date: 04/17/2023 CLINICAL DATA:  Altered level of consciousness EXAM: CT HEAD WITHOUT CONTRAST TECHNIQUE: Contiguous axial images were obtained from the base of the skull through the vertex without intravenous contrast. RADIATION DOSE REDUCTION: This exam was performed according to the departmental dose-optimization program which includes automated exposure control, adjustment of the mA and/or kV according to patient size and/or use of iterative reconstruction technique. COMPARISON:  03/23/2023 FINDINGS: Brain: No evidence of acute infarct or hemorrhage. Stable cerebral atrophy and chronic small vessel ischemic changes within the periventricular white matter. Stable calcified structure in the right supraclinoid region measuring up to 2.3 cm in size, compatible with suspected calcified right ICA aneurysm as reported on prior studies. Lateral ventricles and remaining midline structures are stable. No acute extra-axial fluid collections. No mass effect. Vascular: Stable presumed calcified supraclinoid right ICA aneurysm as above. Stable atherosclerosis. No hyperdense vessel. Skull: Postsurgical changes from prior right frontal craniotomy. No acute or destructive bony abnormalities. Sinuses/Orbits: No acute finding. Other: None. IMPRESSION: 1. Stable head CT, no acute intracranial process. 2. Stable calcified structure in the right supraclinoid region, compatible with calcified right ICA aneurysm as  reported on prior studies. Electronically Signed   By: Sharlet [name] M.D.   On: 04/17/2023 16:14   DG Chest Portable 1 View  Result Date: 04/17/2023 CLINICAL DATA:  Altered mental status. EXAM: PORTABLE CHEST 1 VIEW COMPARISON:  X-ray 03/23/2023. FINDINGS: Underinflation. Old bilateral rib fractures. Chronic appearing interstitial lung changes. No consolidation, pneumothorax or effusion. No edema. Normal cardiopericardial silhouette with tortuous ectatic aorta. Film is rotated to the left. IMPRESSION: Underinflation.  Chronic changes. Electronically Signed   By: Karen Kays M.D.   On: 04/17/2023 15:21    EKG: My personal interpretation of EKG shows: afib    Assessment/Plan Principal Problem:   Admission for terminal care    Assessment and Plan: * Admission for terminal care Admit for terminal care.  Given his rising lactic acid and is tender abdomen, I suspect that he has had some sort of catastrophic abdominal event.  Most likely intestinal ischemia.  Discussed this with the patient's son [name].  Discussed that the patient appears to be actively dying and that no amount of treatment will turn this around.  Son agrees.  Will stop any antibiotics.  Stop all IV fluids.  Start IV morphine for pain.  He may need a PCA.  Told the son that I fully expect the patient to die in the hospital.  Son does not want him to suffer.  I agree with this approach.   DVT prophylaxis:  none due to comfort care Code Status: Comfort Care Family Communication: discussed via phone with son Jb Koda  Disposition Plan: inpatient death expected  Consults called: none  Admission status: Inpatient, Med-Surg   Carollee Herter, DO [facility] 04/17/2023, 6:30 PM

## 2023-04-17 NOTE — ED Triage Notes (Signed)
Pt coming from [place], only opening eyes to name. Baseline patient talks but unable to make sense of what he is saying. Baseline per family Monday, yesterday wouldn't speak, today on way to vascular not speaking. Able to lift arms and legs, follow commands. Trying to force air out to speak but unable to get words out.   88/60 to 124/77 Afib - Plavix - 100 BPM 83 CBG - insulin dependent  20 RAC - 500 NS

## 2023-04-18 DIAGNOSIS — L89152 Pressure ulcer of sacral region, stage 2: Secondary | ICD-10-CM

## 2023-04-18 DIAGNOSIS — E8721 Acute metabolic acidosis: Secondary | ICD-10-CM

## 2023-04-18 DIAGNOSIS — K55059 Acute (reversible) ischemia of intestine, part and extent unspecified: Secondary | ICD-10-CM

## 2023-04-18 DIAGNOSIS — A419 Sepsis, unspecified organism: Secondary | ICD-10-CM

## 2023-04-19 NOTE — Assessment & Plan Note (Signed)
Likely due to probable intestinal ischemia.  Evidenced by hypotension, tachycardia, elevated lactic acid

## 2023-04-19 NOTE — Progress Notes (Signed)
    OVERNIGHT PROGRESS REPORT  Notified by RN that patient is deceased as of 0150 2023-05-14  Patient was comfort care  2 RN verified.  Family was notified.    Tailor Westfall Lamin Geradine Girt, MSN, APRN, AGACNP-BC [facility]  Pager: [phone]. Check Amion for Availability

## 2023-04-19 NOTE — Progress Notes (Signed)
Patient expired at 0150.Attending physician notified.Family notified.

## 2023-04-19 NOTE — Progress Notes (Deleted)
90.65 ml wasted with Orthoptist.

## 2023-04-19 NOTE — Assessment & Plan Note (Signed)
Patient has a long history of worsening dementia.  He has been living in a nursing home for several months.

## 2023-04-19 NOTE — Assessment & Plan Note (Signed)
Patient with known seizure disorder on Keppra and Vimpat

## 2023-04-19 NOTE — Progress Notes (Addendum)
Morphine 100 mg IV,  90.65 ml wasted with Tomma Lightning, RN

## 2023-04-19 NOTE — Death Summary Note (Signed)
DEATH SUMMARY   Patient Details  Name: Gerald Leach MRN: [id number] DOB: 1934-05-07 WUJ:[id number], Kandee Keen, NP  Admission/Discharge Information   Admit Date:  Apr 19, 2023  Date of Death: Date of Death: April 20, 2023  Time of Death: Time of Death: 0150  Length of Stay: 1   Principle Cause of death: probable intestinal ischemia  Hospital Diagnoses: Principal Problem:   Admission for terminal care Active Problems:   Acute intestinal ischemia (HCC) - probable   Sepsis with acute organ dysfunction (HCC)   Acute metabolic acidosis   Seizure disorder (HCC)   Diabetes mellitus type 2, controlled (HCC)   Dementia due to 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  He was brought to vascular surgery today for evaluation of his peripheral arterial disease.  Patient lives in [place].  He was unresponsive at the vascular surgeons office and sent to the ER for evaluation.  On arrival to the ER temp 98.5 heart rate 74 blood pressure 120/81.  Blood pressure subsequently dropped to 85/73.  He remained hypothermic.  Initial lactic acid of 11.9.  After IV fluids, lactic acid increased to 14.1.  White count 14.8, hemoglobin 12.5, platelets of 332  Sodium 144, potassium 4.2, bicarb 13, BUN of 28, creatinine 1.98  CT head showed no acute process.  Chest x-ray showed old bilateral rib fractures.  Patient given 1 L of IV lactated Ringer's.  EDP discussed the case with the patient's son [name] who wanted the patient to be kept comfortable.  Patient made DNR comfort care by EDP.  I subsequently called and spoke with the  patient's son [name].  Reconfirmed that the patient is DNR.  Explained to him that I thought the patient had a catastrophic intra-abdominal event most likely intestinal ischemia given rising lactic acid, hypothermia, hypotension.  I do not think the patient has a survivable diagnosis.  Patient appears to be actively dying.  Son agrees to comfort care measures.  Assessment and Plan: * Admission for terminal care Admit for terminal care.  Given his rising lactic acid and is tender abdomen, I suspect that he has had some sort of catastrophic abdominal event.  Most likely intestinal ischemia.  Discussed this with the patient's son [name].  Discussed that the patient appears to be actively dying and that no amount of treatment will turn this around.  Son agrees.  Will stop any antibiotics.  Stop all IV fluids.  Start IV morphine for pain.  He may need a PCA.  Told the son that I fully expect the patient to die in the hospital.  Son does not want him to suffer.  I agree with this approach.  20-Apr-2023 patient expired on Apr 19, 2000 at 0150 hrs.  Family was notified.  Patient has not candidate for organ donation due to sepsis from probable intestinal ischemia.  Acute intestinal ischemia (HCC) - probable It was believed the patient suffered from acute intestinal ischemia.  This was evidenced by his rising lactic acidosis despite IV fluid resuscitation.  He also had a tender abdomen.  Imaging was not performed as this would not change his overall management.  He was due to debilitated to undergo any surgical procedure.  Acute metabolic acidosis  Due to his acute lactic acidosis, from his sepsis due to probable intestinal ischemia.  Sepsis with acute organ dysfunction (HCC) Likely due to probable intestinal ischemia.  Evidenced by hypotension, tachycardia, elevated lactic acid  Decubitus ulcer of coccyx, stage 2 (HCC) Present on admission.  Stage II.  Documented by nurse on admission.  Dementia due  to Parkinson'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 behavioral disturbance [facility]) Patient has a long history of worsening dementia.  He has been living in a nursing home for several months.  Diabetes mellitus type 2, controlled (HCC) Chronic.  Seizure disorder [facility]) Patient with known seizure disorder on Keppra and Vimpat   The results of significant diagnostics from this hospitalization (including imaging, microbiology, ancillary and laboratory) are listed below for reference.   Significant Diagnostic Studies: CT Head Wo Contrast  Result Date: 04/17/2023 CLINICAL DATA:  Altered level of consciousness EXAM: CT HEAD WITHOUT CONTRAST TECHNIQUE: Contiguous axial images were obtained from the base of the skull through the vertex without intravenous contrast. RADIATION DOSE REDUCTION: This exam was performed according to the departmental dose-optimization program which includes automated exposure control, adjustment of the mA and/or kV according to patient size and/or use of iterative reconstruction technique. COMPARISON:  03/23/2023 FINDINGS: Brain: No evidence of acute infarct or hemorrhage. Stable cerebral atrophy and chronic small vessel ischemic changes within the periventricular white matter. Stable calcified structure in the right supraclinoid region measuring up to 2.3 cm in size, compatible with suspected calcified right ICA aneurysm as reported on prior studies. Lateral ventricles and remaining midline structures are stable. No acute extra-axial fluid collections. No mass effect. Vascular: Stable presumed calcified supraclinoid right ICA aneurysm as above. Stable atherosclerosis. No hyperdense vessel. Skull: Postsurgical changes from prior right frontal craniotomy. No acute or destructive bony abnormalities. Sinuses/Orbits: No acute finding. Other: None. IMPRESSION: 1. Stable head CT, no acute intracranial process. 2. Stable calcified structure in the right supraclinoid region, compatible with calcified right ICA  aneurysm as reported on prior studies. Electronically Signed   By: Sharlet [name] M.D.   On: 04/17/2023 16:14   DG Chest Portable 1 View  Result Date: 04/17/2023 CLINICAL DATA:  Altered mental status. EXAM: PORTABLE CHEST 1 VIEW COMPARISON:  X-ray 03/23/2023. FINDINGS: Underinflation. Old bilateral rib fractures. Chronic appearing interstitial lung changes. No consolidation, pneumothorax or effusion. No edema. Normal cardiopericardial silhouette with tortuous ectatic aorta. Film is rotated to the left. IMPRESSION: Underinflation.  Chronic changes. Electronically Signed   By: Karen Kays M.D.   On: 04/17/2023 15:21   VAS [place] ABI WITH/WO TBI  Result Date: 04/10/2023  LOWER EXTREMITY DOPPLER STUDY Patient Name:  NEMO SUDBURY  Date of Exam:   04/10/2023 Medical Rec #: [id number]       Accession #:    [id number] Date of Birth: 08-Jun-1934        Patient Gender: M Patient Age:   54 years Exam Location:  [facility] Procedure:      VAS [place] ABI WITH/WO TBI Referring Phys: Coral Else --------------------------------------------------------------------------------  Indications: Peripheral artery disease. High Risk Factors: Hypertension, hyperlipidemia, no history of smoking.  Performing Technologist: Elita Quick RVT  Examination Guidelines: A complete evaluation includes at minimum, Doppler waveform signals and systolic blood pressure reading at the level of bilateral brachial, anterior tibial, and posterior tibial arteries, when vessel segments are accessible. Bilateral testing is considered an integral part of a complete examination. Photoelectric Plethysmograph (PPG) waveforms and toe systolic pressure readings are included as required and additional duplex testing as  needed. Limited examinations for reoccurring indications may be performed as noted.  ABI Findings: +---------+------------------+-----+--------+--------+ Right    Rt Pressure (mmHg)IndexWaveformComment   +---------+------------------+-----+--------+--------+ Brachial 97                                      +---------+------------------+-----+--------+--------+ PTA      121               1.25 biphasic         +---------+------------------+-----+--------+--------+ DP       126               1.30 biphasic         +---------+------------------+-----+--------+--------+ Great Toe93                0.96 Normal           +---------+------------------+-----+--------+--------+ +---------+------------------+-----+---------+-------+ Left     Lt Pressure (mmHg)IndexWaveform Comment +---------+------------------+-----+---------+-------+ Brachial 82                                      +---------+------------------+-----+---------+-------+ PTA      128               1.32 triphasic        +---------+------------------+-----+---------+-------+ DP       109               1.12 biphasic         +---------+------------------+-----+---------+-------+ Great Toe81                0.84 Normal           +---------+------------------+-----+---------+-------+ +-------+-----------+-----------+------------+------------+ ABI/TBIToday's ABIToday's TBIPrevious ABIPrevious TBI +-------+-----------+-----------+------------+------------+ Right  1.30       0.96       1.28        1.04         +-------+-----------+-----------+------------+------------+ Left   1.32       0.84       1.28        0.91         +-------+-----------+-----------+------------+------------+  Bilateral ABIs and TBIs appear essentially unchanged since prior exam of 09/23/2019  Summary: Right: Resting right ankle-brachial index is within normal range. The right toe-brachial index is normal. Left: Resting left ankle-brachial index is within normal range. The left toe-brachial index is normal. *See table(s) above for measurements and observations.  Electronically signed by Lemar Livings MD on 04/10/2023 at 12:11:26 PM.     Final    CT CERVICAL SPINE WO CONTRAST  Result Date: 03/23/2023 CLINICAL DATA:  Fall EXAM: CT CERVICAL SPINE WITHOUT CONTRAST TECHNIQUE: Multidetector CT imaging of the cervical spine was performed without intravenous contrast. Multiplanar CT image reconstructions were also generated. RADIATION DOSE REDUCTION: This exam was performed according to the departmental dose-optimization program which includes automated exposure control, adjustment of the mA and/or kV according to patient size and/or use of iterative reconstruction technique. COMPARISON:  CT 11/20/2022 FINDINGS: Alignment: Trace retrolisthesis C3 on C4. Facet alignment is within normal limits. Skull base and vertebrae: No acute fracture. No primary bone lesion or focal pathologic process. Mild chronic superior endplate deformity T3. Soft tissues and spinal canal: No prevertebral fluid or swelling. No visible canal hematoma. Disc levels: Partial ankylosis at C2-C3. Advanced C1-C2 degenerative change with erosive change at the tip of dens. Mild to moderate diffuse  degenerative changes with multilevel disc space narrowing, facet degenerative changes and foraminal narrowing Upper chest: Negative. Other: None IMPRESSION: 1. No CT evidence for acute osseous abnormality. 2. Degenerative changes. 3. Mild chronic superior endplate deformity at T3. Electronically Signed   By: Jasmine Pang M.D.   On: 03/23/2023 18:50   CT HEAD WO CONTRAST  Result Date: 03/23/2023 CLINICAL DATA:  Head trauma EXAM: CT HEAD WITHOUT CONTRAST TECHNIQUE: Contiguous axial images were obtained from the base of the skull through the vertex without intravenous contrast. RADIATION DOSE REDUCTION: This exam was performed according to the departmental dose-optimization program which includes automated exposure control, adjustment of the mA and/or kV according to patient size and/or use of iterative reconstruction technique. COMPARISON:  CT 11/20/2022 FINDINGS: Brain: No acute  territorial infarction, hemorrhage, or new intracranial mass. Stable dystrophic coarse calcification in the right supraclinoid region measuring 2.4 x 1.6 cm, thought to represent giant calcified ICA aneurysm on previous exams. No surrounding edema or mass effect. Atrophy. Advanced chronic small vessel ischemic changes of the white matter. Stable ventricular enlargement. Small right temporal lobe white matter encephalomalacia, chronic. Vascular: No hyperdense vessels.  Carotid vascular calcification Skull: Right craniotomy.  No fracture Sinuses/Orbits: No acute finding. Other: None IMPRESSION: 1. No CT evidence for acute intracranial abnormality. 2. Atrophy and advanced chronic small vessel ischemic changes of the white matter. 3. Stable dystrophic coarse calcification in the right supraclinoid region, thought to represent giant calcified ICA aneurysm on previous exams. Electronically Signed   By: Jasmine Pang M.D.   On: 03/23/2023 18:45   DG Pelvis Portable  Result Date: 03/23/2023 CLINICAL DATA:  Trauma fall EXAM: PORTABLE PELVIS 1-2 VIEWS COMPARISON:  11/20/2022 FINDINGS: SI joints are non widened. Pubic symphysis and rami appear grossly intact. No definitive fracture or malalignment. Mild hip degenerative change. Catheter or tubing over the left groin area IMPRESSION: No acute osseous abnormality. Electronically Signed   By: Jasmine Pang M.D.   On: 03/23/2023 18:35   DG Chest Port 1 View  Result Date: 03/23/2023 CLINICAL DATA:  Trauma EXAM: PORTABLE CHEST 1 VIEW COMPARISON:  02/18/2022, 11/20/2022, CT 11/20/2022 FINDINGS: Multiple chronic bilateral rib fractures. Stable cardiomediastinal silhouette with borderline cardiomegaly and ectatic aorta. Aortic atherosclerosis. Scarring or atelectasis at the bases. No acute airspace disease, pleural effusion or pneumothorax. IMPRESSION: No active disease. Borderline cardiomegaly. Electronically Signed   By: Jasmine Pang M.D.   On: 03/23/2023 18:34     CBC: Recent Labs  Lab 04/17/23 1314 04/17/23 1321  WBC 14.8*  --   NEUTROABS 13.0*  --   HGB 12.5* 13.3  HCT 38.9* 39.0  MCV 90.7  --   PLT 332  --    Basic Metabolic Panel: Recent Labs  Lab 04/17/23 1314 04/17/23 1321  NA 144 142  K 4.2 4.1  CL 107  --   CO2 13*  --   GLUCOSE 86  --   BUN 28*  --   CREATININE 1.97*  --   CALCIUM 7.8*  --    GFR: Estimated Creatinine Clearance: 20.6 mL/min (A) (by C-G formula based on SCr of 1.97 mg/dL (H)). Liver Function Tests: Recent Labs  Lab 04/17/23 1314  AST 65*  ALT 14  ALKPHOS 119  BILITOT 1.3*  PROT 5.6*  ALBUMIN 1.8*   Recent Labs  Lab 04/17/23 1314  LIPASE 18   Sepsis Labs: Recent Labs  Lab 04/17/23 1321 04/17/23 1702  LATICACIDVEN 11.9* 14.1*    Time spent: 45 minutes  Signed:  Carollee Herter, DO

## 2023-04-19 NOTE — Assessment & Plan Note (Signed)
Due to his acute lactic acidosis, from his sepsis due to probable intestinal ischemia.

## 2023-04-19 NOTE — Assessment & Plan Note (Signed)
It was believed the patient suffered from acute intestinal ischemia.  This was evidenced by his rising lactic acidosis despite IV fluid resuscitation.  He also had a tender abdomen.  Imaging was not performed as this would not change his overall management.  He was due to debilitated to undergo any surgical procedure.

## 2023-04-19 NOTE — Plan of Care (Signed)
  Problem: Coping: Goal: Ability to identify and develop effective coping behavior will improve Outcome: Progressing   Problem: Pain Management: Goal: Satisfaction with pain management regimen will improve Outcome: Progressing   Problem: Clinical Measurements: Goal: Will remain free from infection Outcome: Progressing   Problem: Clinical Measurements: Goal: Respiratory complications will improve Outcome: Progressing   Problem: Safety: Goal: Ability to remain free from injury will improve Outcome: Progressing

## 2023-04-19 NOTE — Assessment & Plan Note (Signed)
Chronic. 

## 2023-04-19 NOTE — Assessment & Plan Note (Addendum)
Present on admission.  Stage II.  Documented by nurse on admission.

## 2023-04-19 DEATH — deceased
